# Patient Record
Sex: Female | Born: 1935 | Race: White | Hispanic: No | Smoking: Never smoker
Health system: Southern US, Community
[De-identification: ages and names within clinical notes are randomized; demographics above are authoritative.]

## PROBLEM LIST (undated history)

## (undated) DIAGNOSIS — K2990 Gastroduodenitis, unspecified, without bleeding: Secondary | ICD-10-CM

## (undated) DIAGNOSIS — K2970 Gastritis, unspecified, without bleeding: Secondary | ICD-10-CM

## (undated) DIAGNOSIS — E039 Hypothyroidism, unspecified: Secondary | ICD-10-CM

## (undated) DIAGNOSIS — R768 Other specified abnormal immunological findings in serum: Secondary | ICD-10-CM

## (undated) DIAGNOSIS — M179 Osteoarthritis of knee, unspecified: Secondary | ICD-10-CM

## (undated) DIAGNOSIS — I2699 Other pulmonary embolism without acute cor pulmonale: Secondary | ICD-10-CM

## (undated) DIAGNOSIS — I10 Essential (primary) hypertension: Secondary | ICD-10-CM

## (undated) DIAGNOSIS — M169 Osteoarthritis of hip, unspecified: Secondary | ICD-10-CM

## (undated) DIAGNOSIS — M2550 Pain in unspecified joint: Secondary | ICD-10-CM

## (undated) DIAGNOSIS — E7800 Pure hypercholesterolemia, unspecified: Secondary | ICD-10-CM

## (undated) DIAGNOSIS — C50919 Malignant neoplasm of unspecified site of unspecified female breast: Secondary | ICD-10-CM

## (undated) DIAGNOSIS — N281 Cyst of kidney, acquired: Secondary | ICD-10-CM

## (undated) DIAGNOSIS — E871 Hypo-osmolality and hyponatremia: Secondary | ICD-10-CM

## (undated) DIAGNOSIS — B009 Herpesviral infection, unspecified: Secondary | ICD-10-CM

## (undated) DIAGNOSIS — R609 Edema, unspecified: Secondary | ICD-10-CM

## (undated) DIAGNOSIS — D649 Anemia, unspecified: Secondary | ICD-10-CM

## (undated) DIAGNOSIS — M1710 Unilateral primary osteoarthritis, unspecified knee: Secondary | ICD-10-CM

## (undated) DIAGNOSIS — E538 Deficiency of other specified B group vitamins: Secondary | ICD-10-CM

## (undated) DIAGNOSIS — J309 Allergic rhinitis, unspecified: Secondary | ICD-10-CM

## (undated) DIAGNOSIS — E559 Vitamin D deficiency, unspecified: Secondary | ICD-10-CM

## (undated) DIAGNOSIS — G4700 Insomnia, unspecified: Secondary | ICD-10-CM

## (undated) DIAGNOSIS — G5790 Unspecified mononeuropathy of unspecified lower limb: Secondary | ICD-10-CM

## (undated) DIAGNOSIS — G629 Polyneuropathy, unspecified: Secondary | ICD-10-CM

## (undated) DIAGNOSIS — M19039 Primary osteoarthritis, unspecified wrist: Secondary | ICD-10-CM

## (undated) DIAGNOSIS — IMO0002 Reserved for concepts with insufficient information to code with codable children: Secondary | ICD-10-CM

## (undated) DIAGNOSIS — M810 Age-related osteoporosis without current pathological fracture: Secondary | ICD-10-CM

## (undated) DIAGNOSIS — Z8619 Personal history of other infectious and parasitic diseases: Secondary | ICD-10-CM

## (undated) DIAGNOSIS — R894 Abnormal immunological findings in specimens from other organs, systems and tissues: Secondary | ICD-10-CM

## (undated) DIAGNOSIS — N182 Chronic kidney disease, stage 2 (mild): Secondary | ICD-10-CM

## (undated) DIAGNOSIS — E1122 Type 2 diabetes mellitus with diabetic chronic kidney disease: Secondary | ICD-10-CM

## (undated) DIAGNOSIS — I671 Cerebral aneurysm, nonruptured: Secondary | ICD-10-CM

## (undated) HISTORY — DX: Anemia, unspecified: D64.9

## (undated) HISTORY — DX: Vitamin D deficiency, unspecified: E55.9

## (undated) HISTORY — PX: DILATION AND CURETTAGE OF UTERUS: SHX78

## (undated) HISTORY — PX: BREAST SURGERY: SHX581

## (undated) HISTORY — DX: Cyst of kidney, acquired: N28.1

## (undated) HISTORY — PX: NASAL SINUS SURGERY: SHX719

## (undated) HISTORY — DX: Insomnia, unspecified: G47.00

## (undated) HISTORY — PX: FRACTURE SURGERY: SHX138

## (undated) HISTORY — PX: KYPHOPLASTY: SHX5884

## (undated) HISTORY — DX: Herpesviral infection, unspecified: B00.9

## (undated) HISTORY — DX: Age-related osteoporosis without current pathological fracture: M81.0

## (undated) HISTORY — DX: Unspecified mononeuropathy of unspecified lower limb: G57.90

## (undated) HISTORY — DX: Osteoarthritis of hip, unspecified: M16.9

## (undated) HISTORY — DX: Pure hypercholesterolemia, unspecified: E78.00

## (undated) HISTORY — DX: Type 2 diabetes mellitus with diabetic chronic kidney disease: E11.22

## (undated) HISTORY — DX: Edema, unspecified: R60.9

## (undated) HISTORY — DX: Allergic rhinitis, unspecified: J30.9

## (undated) HISTORY — DX: Unilateral primary osteoarthritis, unspecified knee: M17.10

## (undated) HISTORY — DX: Reserved for concepts with insufficient information to code with codable children: IMO0002

## (undated) HISTORY — DX: Primary osteoarthritis, unspecified wrist: M19.039

## (undated) HISTORY — DX: Deficiency of other specified B group vitamins: E53.8

## (undated) HISTORY — DX: Essential (primary) hypertension: I10

## (undated) HISTORY — DX: Osteoarthritis of knee, unspecified: M17.9

## (undated) HISTORY — DX: Gastroduodenitis, unspecified, without bleeding: K29.90

## (undated) HISTORY — DX: Cerebral aneurysm, nonruptured: I67.1

## (undated) HISTORY — DX: Personal history of other infectious and parasitic diseases: Z86.19

## (undated) HISTORY — DX: Hypo-osmolality and hyponatremia: E87.1

## (undated) HISTORY — DX: Malignant neoplasm of unspecified site of unspecified female breast: C50.919

## (undated) HISTORY — DX: Chronic kidney disease, stage 2 (mild): N18.2

## (undated) HISTORY — DX: Hypothyroidism, unspecified: E03.9

## (undated) HISTORY — DX: Pain in unspecified joint: M25.50

## (undated) HISTORY — DX: Gastritis, unspecified, without bleeding: K29.70

## (undated) HISTORY — DX: Abnormal immunological findings in specimens from other organs, systems and tissues: R89.4

---
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
## 1966-05-16 HISTORY — PX: TONSILLECTOMY: SUR1361

## 1988-05-16 DIAGNOSIS — C50919 Malignant neoplasm of unspecified site of unspecified female breast: Secondary | ICD-10-CM

## 1988-05-16 HISTORY — DX: Malignant neoplasm of unspecified site of unspecified female breast: C50.919

## 1989-04-29 HISTORY — PX: MASTECTOMY: SHX3

## 2006-09-20 HISTORY — PX: LAPAROSCOPIC CHOLECYSTECTOMY: SUR755

## 2007-06-13 HISTORY — PX: ORIF HIP FRACTURE: SHX2125

## 2008-08-14 DIAGNOSIS — I671 Cerebral aneurysm, nonruptured: Secondary | ICD-10-CM

## 2008-08-14 HISTORY — DX: Cerebral aneurysm, nonruptured: I67.1

## 2010-07-03 HISTORY — PX: KYPHOPLASTY: SHX5884

## 2011-07-05 DIAGNOSIS — I10 Essential (primary) hypertension: Secondary | ICD-10-CM | POA: Diagnosis not present

## 2011-07-05 DIAGNOSIS — M1710 Unilateral primary osteoarthritis, unspecified knee: Secondary | ICD-10-CM | POA: Diagnosis not present

## 2011-07-05 DIAGNOSIS — E039 Hypothyroidism, unspecified: Secondary | ICD-10-CM | POA: Diagnosis not present

## 2011-07-05 DIAGNOSIS — E7800 Pure hypercholesterolemia, unspecified: Secondary | ICD-10-CM | POA: Diagnosis not present

## 2011-07-05 DIAGNOSIS — E538 Deficiency of other specified B group vitamins: Secondary | ICD-10-CM | POA: Diagnosis not present

## 2011-07-19 DIAGNOSIS — I110 Hypertensive heart disease with heart failure: Secondary | ICD-10-CM | POA: Diagnosis not present

## 2011-07-19 DIAGNOSIS — I509 Heart failure, unspecified: Secondary | ICD-10-CM | POA: Diagnosis not present

## 2011-08-02 DIAGNOSIS — M810 Age-related osteoporosis without current pathological fracture: Secondary | ICD-10-CM | POA: Diagnosis not present

## 2011-08-02 DIAGNOSIS — Z780 Asymptomatic menopausal state: Secondary | ICD-10-CM | POA: Diagnosis not present

## 2011-08-26 DIAGNOSIS — C50919 Malignant neoplasm of unspecified site of unspecified female breast: Secondary | ICD-10-CM | POA: Diagnosis not present

## 2011-08-26 DIAGNOSIS — M810 Age-related osteoporosis without current pathological fracture: Secondary | ICD-10-CM | POA: Diagnosis not present

## 2011-08-26 DIAGNOSIS — M159 Polyosteoarthritis, unspecified: Secondary | ICD-10-CM | POA: Diagnosis not present

## 2011-08-26 DIAGNOSIS — I10 Essential (primary) hypertension: Secondary | ICD-10-CM | POA: Diagnosis not present

## 2011-10-18 DIAGNOSIS — R6889 Other general symptoms and signs: Secondary | ICD-10-CM | POA: Diagnosis not present

## 2011-10-18 DIAGNOSIS — R7989 Other specified abnormal findings of blood chemistry: Secondary | ICD-10-CM | POA: Diagnosis not present

## 2011-10-18 DIAGNOSIS — C119 Malignant neoplasm of nasopharynx, unspecified: Secondary | ICD-10-CM | POA: Diagnosis not present

## 2011-10-18 DIAGNOSIS — C50919 Malignant neoplasm of unspecified site of unspecified female breast: Secondary | ICD-10-CM | POA: Diagnosis not present

## 2011-10-18 DIAGNOSIS — M810 Age-related osteoporosis without current pathological fracture: Secondary | ICD-10-CM | POA: Diagnosis not present

## 2011-11-04 DIAGNOSIS — Z853 Personal history of malignant neoplasm of breast: Secondary | ICD-10-CM | POA: Diagnosis not present

## 2011-11-04 DIAGNOSIS — Z9010 Acquired absence of unspecified breast and nipple: Secondary | ICD-10-CM | POA: Diagnosis not present

## 2011-11-04 DIAGNOSIS — Z1231 Encounter for screening mammogram for malignant neoplasm of breast: Secondary | ICD-10-CM | POA: Diagnosis not present

## 2012-01-10 DIAGNOSIS — E039 Hypothyroidism, unspecified: Secondary | ICD-10-CM | POA: Diagnosis not present

## 2012-01-10 DIAGNOSIS — I10 Essential (primary) hypertension: Secondary | ICD-10-CM | POA: Diagnosis not present

## 2012-01-10 DIAGNOSIS — E7800 Pure hypercholesterolemia, unspecified: Secondary | ICD-10-CM | POA: Diagnosis not present

## 2012-01-10 DIAGNOSIS — E538 Deficiency of other specified B group vitamins: Secondary | ICD-10-CM | POA: Diagnosis not present

## 2012-01-18 DIAGNOSIS — I10 Essential (primary) hypertension: Secondary | ICD-10-CM | POA: Diagnosis not present

## 2012-01-18 DIAGNOSIS — E538 Deficiency of other specified B group vitamins: Secondary | ICD-10-CM | POA: Diagnosis not present

## 2012-01-18 DIAGNOSIS — E7800 Pure hypercholesterolemia, unspecified: Secondary | ICD-10-CM | POA: Diagnosis not present

## 2012-01-18 DIAGNOSIS — E039 Hypothyroidism, unspecified: Secondary | ICD-10-CM | POA: Diagnosis not present

## 2012-01-18 DIAGNOSIS — E559 Vitamin D deficiency, unspecified: Secondary | ICD-10-CM | POA: Diagnosis not present

## 2012-02-09 DIAGNOSIS — I369 Nonrheumatic tricuspid valve disorder, unspecified: Secondary | ICD-10-CM | POA: Diagnosis not present

## 2012-02-09 DIAGNOSIS — I110 Hypertensive heart disease with heart failure: Secondary | ICD-10-CM | POA: Diagnosis not present

## 2012-02-09 DIAGNOSIS — I509 Heart failure, unspecified: Secondary | ICD-10-CM | POA: Diagnosis not present

## 2012-02-09 DIAGNOSIS — I2609 Other pulmonary embolism with acute cor pulmonale: Secondary | ICD-10-CM | POA: Diagnosis not present

## 2012-02-09 DIAGNOSIS — I059 Rheumatic mitral valve disease, unspecified: Secondary | ICD-10-CM | POA: Diagnosis not present

## 2012-02-17 DIAGNOSIS — E039 Hypothyroidism, unspecified: Secondary | ICD-10-CM | POA: Diagnosis not present

## 2012-02-17 DIAGNOSIS — E538 Deficiency of other specified B group vitamins: Secondary | ICD-10-CM | POA: Diagnosis not present

## 2012-02-28 DIAGNOSIS — C50919 Malignant neoplasm of unspecified site of unspecified female breast: Secondary | ICD-10-CM | POA: Diagnosis not present

## 2012-02-28 DIAGNOSIS — I10 Essential (primary) hypertension: Secondary | ICD-10-CM | POA: Diagnosis not present

## 2012-02-28 DIAGNOSIS — M810 Age-related osteoporosis without current pathological fracture: Secondary | ICD-10-CM | POA: Diagnosis not present

## 2012-02-28 DIAGNOSIS — M159 Polyosteoarthritis, unspecified: Secondary | ICD-10-CM | POA: Diagnosis not present

## 2012-03-05 DIAGNOSIS — Z23 Encounter for immunization: Secondary | ICD-10-CM | POA: Diagnosis not present

## 2012-04-05 DIAGNOSIS — I10 Essential (primary) hypertension: Secondary | ICD-10-CM | POA: Diagnosis not present

## 2012-04-05 DIAGNOSIS — M549 Dorsalgia, unspecified: Secondary | ICD-10-CM | POA: Diagnosis not present

## 2012-04-05 DIAGNOSIS — G8929 Other chronic pain: Secondary | ICD-10-CM | POA: Diagnosis not present

## 2012-04-05 DIAGNOSIS — R110 Nausea: Secondary | ICD-10-CM | POA: Diagnosis not present

## 2012-07-24 DIAGNOSIS — C50919 Malignant neoplasm of unspecified site of unspecified female breast: Secondary | ICD-10-CM | POA: Diagnosis not present

## 2012-07-24 DIAGNOSIS — M159 Polyosteoarthritis, unspecified: Secondary | ICD-10-CM | POA: Diagnosis not present

## 2012-07-24 DIAGNOSIS — I10 Essential (primary) hypertension: Secondary | ICD-10-CM | POA: Diagnosis not present

## 2012-07-24 DIAGNOSIS — M810 Age-related osteoporosis without current pathological fracture: Secondary | ICD-10-CM | POA: Diagnosis not present

## 2012-08-09 DIAGNOSIS — I110 Hypertensive heart disease with heart failure: Secondary | ICD-10-CM | POA: Diagnosis not present

## 2012-08-09 DIAGNOSIS — I2609 Other pulmonary embolism with acute cor pulmonale: Secondary | ICD-10-CM | POA: Diagnosis not present

## 2012-08-09 DIAGNOSIS — I509 Heart failure, unspecified: Secondary | ICD-10-CM | POA: Diagnosis not present

## 2012-09-25 DIAGNOSIS — R0602 Shortness of breath: Secondary | ICD-10-CM | POA: Diagnosis not present

## 2012-09-25 DIAGNOSIS — Z853 Personal history of malignant neoplasm of breast: Secondary | ICD-10-CM | POA: Diagnosis not present

## 2012-09-25 DIAGNOSIS — R5381 Other malaise: Secondary | ICD-10-CM | POA: Diagnosis not present

## 2012-09-25 DIAGNOSIS — Z79899 Other long term (current) drug therapy: Secondary | ICD-10-CM | POA: Diagnosis not present

## 2012-09-25 DIAGNOSIS — R05 Cough: Secondary | ICD-10-CM | POA: Diagnosis not present

## 2012-09-25 DIAGNOSIS — E860 Dehydration: Secondary | ICD-10-CM | POA: Diagnosis not present

## 2012-09-25 DIAGNOSIS — J189 Pneumonia, unspecified organism: Secondary | ICD-10-CM | POA: Diagnosis not present

## 2012-09-25 DIAGNOSIS — I498 Other specified cardiac arrhythmias: Secondary | ICD-10-CM | POA: Diagnosis not present

## 2012-09-25 DIAGNOSIS — I517 Cardiomegaly: Secondary | ICD-10-CM | POA: Diagnosis not present

## 2012-09-25 DIAGNOSIS — E039 Hypothyroidism, unspecified: Secondary | ICD-10-CM | POA: Diagnosis not present

## 2012-09-25 DIAGNOSIS — I10 Essential (primary) hypertension: Secondary | ICD-10-CM | POA: Diagnosis not present

## 2012-09-25 DIAGNOSIS — R791 Abnormal coagulation profile: Secondary | ICD-10-CM | POA: Diagnosis not present

## 2012-09-25 DIAGNOSIS — R000 Tachycardia, unspecified: Secondary | ICD-10-CM | POA: Diagnosis not present

## 2012-10-12 DIAGNOSIS — E039 Hypothyroidism, unspecified: Secondary | ICD-10-CM | POA: Diagnosis not present

## 2012-10-12 DIAGNOSIS — E538 Deficiency of other specified B group vitamins: Secondary | ICD-10-CM | POA: Diagnosis not present

## 2012-10-16 DIAGNOSIS — R142 Eructation: Secondary | ICD-10-CM | POA: Diagnosis not present

## 2012-10-16 DIAGNOSIS — R0989 Other specified symptoms and signs involving the circulatory and respiratory systems: Secondary | ICD-10-CM | POA: Diagnosis not present

## 2012-10-16 DIAGNOSIS — R630 Anorexia: Secondary | ICD-10-CM | POA: Diagnosis not present

## 2012-10-16 DIAGNOSIS — J309 Allergic rhinitis, unspecified: Secondary | ICD-10-CM | POA: Diagnosis not present

## 2012-10-16 DIAGNOSIS — R143 Flatulence: Secondary | ICD-10-CM | POA: Diagnosis not present

## 2012-10-16 DIAGNOSIS — R141 Gas pain: Secondary | ICD-10-CM | POA: Diagnosis not present

## 2012-10-26 DIAGNOSIS — R143 Flatulence: Secondary | ICD-10-CM | POA: Diagnosis not present

## 2012-11-27 DIAGNOSIS — C50919 Malignant neoplasm of unspecified site of unspecified female breast: Secondary | ICD-10-CM | POA: Diagnosis not present

## 2012-11-27 DIAGNOSIS — M810 Age-related osteoporosis without current pathological fracture: Secondary | ICD-10-CM | POA: Diagnosis not present

## 2012-11-27 DIAGNOSIS — M159 Polyosteoarthritis, unspecified: Secondary | ICD-10-CM | POA: Diagnosis not present

## 2012-11-27 DIAGNOSIS — E039 Hypothyroidism, unspecified: Secondary | ICD-10-CM | POA: Diagnosis not present

## 2013-01-01 DIAGNOSIS — E039 Hypothyroidism, unspecified: Secondary | ICD-10-CM | POA: Diagnosis not present

## 2013-01-01 DIAGNOSIS — E559 Vitamin D deficiency, unspecified: Secondary | ICD-10-CM | POA: Diagnosis not present

## 2013-01-01 DIAGNOSIS — M159 Polyosteoarthritis, unspecified: Secondary | ICD-10-CM | POA: Diagnosis not present

## 2013-01-01 DIAGNOSIS — M810 Age-related osteoporosis without current pathological fracture: Secondary | ICD-10-CM | POA: Diagnosis not present

## 2013-01-01 DIAGNOSIS — E538 Deficiency of other specified B group vitamins: Secondary | ICD-10-CM | POA: Diagnosis not present

## 2013-01-01 DIAGNOSIS — E871 Hypo-osmolality and hyponatremia: Secondary | ICD-10-CM | POA: Diagnosis not present

## 2013-01-01 DIAGNOSIS — I10 Essential (primary) hypertension: Secondary | ICD-10-CM | POA: Diagnosis not present

## 2013-02-21 DIAGNOSIS — I10 Essential (primary) hypertension: Secondary | ICD-10-CM | POA: Diagnosis not present

## 2013-02-21 DIAGNOSIS — E039 Hypothyroidism, unspecified: Secondary | ICD-10-CM | POA: Diagnosis not present

## 2013-02-21 DIAGNOSIS — M25539 Pain in unspecified wrist: Secondary | ICD-10-CM | POA: Diagnosis not present

## 2013-02-21 DIAGNOSIS — M2550 Pain in unspecified joint: Secondary | ICD-10-CM | POA: Diagnosis not present

## 2013-02-21 DIAGNOSIS — G4700 Insomnia, unspecified: Secondary | ICD-10-CM | POA: Diagnosis not present

## 2013-02-21 DIAGNOSIS — Z23 Encounter for immunization: Secondary | ICD-10-CM | POA: Diagnosis not present

## 2013-02-26 DIAGNOSIS — I369 Nonrheumatic tricuspid valve disorder, unspecified: Secondary | ICD-10-CM | POA: Diagnosis not present

## 2013-02-26 DIAGNOSIS — I110 Hypertensive heart disease with heart failure: Secondary | ICD-10-CM | POA: Diagnosis not present

## 2013-02-26 DIAGNOSIS — I059 Rheumatic mitral valve disease, unspecified: Secondary | ICD-10-CM | POA: Diagnosis not present

## 2013-02-26 DIAGNOSIS — R9431 Abnormal electrocardiogram [ECG] [EKG]: Secondary | ICD-10-CM | POA: Diagnosis not present

## 2013-04-01 DIAGNOSIS — E039 Hypothyroidism, unspecified: Secondary | ICD-10-CM | POA: Diagnosis not present

## 2013-05-16 DIAGNOSIS — R609 Edema, unspecified: Secondary | ICD-10-CM

## 2013-05-16 HISTORY — DX: Edema, unspecified: R60.9

## 2013-10-17 ENCOUNTER — Ambulatory Visit: Payer: Medicare Other | Admitting: Nurse Practitioner

## 2013-10-22 ENCOUNTER — Encounter: Payer: Self-pay | Admitting: Internal Medicine

## 2013-10-22 ENCOUNTER — Non-Acute Institutional Stay: Payer: Medicare Other | Admitting: Internal Medicine

## 2013-10-22 VITALS — BP 140/68 | HR 68 | Temp 97.4°F | Ht 63.0 in | Wt 121.0 lb

## 2013-10-22 DIAGNOSIS — C50919 Malignant neoplasm of unspecified site of unspecified female breast: Secondary | ICD-10-CM

## 2013-10-22 DIAGNOSIS — M810 Age-related osteoporosis without current pathological fracture: Secondary | ICD-10-CM | POA: Diagnosis not present

## 2013-10-22 DIAGNOSIS — G4700 Insomnia, unspecified: Secondary | ICD-10-CM

## 2013-10-22 DIAGNOSIS — M169 Osteoarthritis of hip, unspecified: Secondary | ICD-10-CM

## 2013-10-22 DIAGNOSIS — I10 Essential (primary) hypertension: Secondary | ICD-10-CM

## 2013-10-22 DIAGNOSIS — E7800 Pure hypercholesterolemia, unspecified: Secondary | ICD-10-CM

## 2013-10-22 DIAGNOSIS — M1610 Unilateral primary osteoarthritis, unspecified hip: Secondary | ICD-10-CM

## 2013-10-22 DIAGNOSIS — M19039 Primary osteoarthritis, unspecified wrist: Secondary | ICD-10-CM

## 2013-10-22 DIAGNOSIS — R609 Edema, unspecified: Secondary | ICD-10-CM

## 2013-10-22 DIAGNOSIS — E039 Hypothyroidism, unspecified: Secondary | ICD-10-CM

## 2013-10-22 DIAGNOSIS — D649 Anemia, unspecified: Secondary | ICD-10-CM | POA: Diagnosis not present

## 2013-10-22 DIAGNOSIS — IMO0002 Reserved for concepts with insufficient information to code with codable children: Secondary | ICD-10-CM

## 2013-10-22 NOTE — Progress Notes (Signed)
Patient ID: Bailey [name], female   DOB: [birth year], 80 y.o.   MRN: [id number]    Location:  [facility] [phone])  Extended Emergency Contact Information Primary Emergency Contact: Tricarico,[name] Address: [address]          [place],  [address] [name] of [place] Phone: [phone] Relation: Spouse   Chief Complaint  Patient presents with  . Medical Management of Chronic Issues    New Patient, c/o back pain, needs refills on medications    HPI:  New patient. First visit. Previously lived in [place]. Son and daughter in law live in [place]. Moved from [place] 55 years ago. (1960).  She previously worked as a Marine scientist.  Hx OP and fracture of vertebra. Hx of kyphoplasty in 2009 and 2010. Losing height. Went from 58" to 55".  Left femur fracture in 2009.  Back pain has been worse lately. Complains of left hip and knee pains.  Edema of both legs since  Feb 2015.   Past Medical History  Diagnosis Date  . Osteoarthrosis, unspecified whether generalized or localized, forearm     bilaterally wrist  . Allergic rhinitis, cause unspecified   . Senile osteoporosis   . Malignant neoplasm of breast (female), unspecified site 1990    left. Had surgerey and chemo, but no radiation  . Acquired cyst of kidney     left  . 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 Anemia, unspecified   . Unspecified gastritis and gastroduodenitis without mention of hemorrhage   . 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 Other and unspecified nonspecific immunological findings     positive ANA  . History of Epstein-Barr virus infection   . Herpes simplex without mention of complication   . Insomnia, unspecified   . Unspecified hypothyroidism   . Hyposmolality and/or hyponatremia   . Unspecified vitamin D deficiency   . Pure hypercholesterolemia   . Other  B-complex deficiencies   . Osteoarthrosis of knee     bilaterally  . Closed fracture of unspecified part of vertebral column without mention of spinal cord injury     T7 s/p kyphoplasty, T12  . Unspecified essential hypertension   . Pain in joint, site unspecified     severe, diffuse, chronic pain  . Osteoarthrosis, hip     Past Surgical History  Procedure Laterality Date  . Dilation and curettage of uterus      x2  . Middle ear surgery Bilateral 1938  . Mastectomy Left 04/29/1989  . Tonsillectomy  1968  . Nasal sinus surgery  1990 & 2000  . Cholecystectomy, laparoscopic  09/20/2006  . Orif hip fracture Left 06/13/2007    following a syncopal episode  . Kyphoplasty  07/03/2010    T7 and T12    History   Social History  . Marital Status: Married    Spouse Name: N/A    Number of Children: N/A  . Years of Education: N/A   Occupational History  . retired Marine scientist    Social History Main Topics  . Smoking status: Former Research scientist (life sciences)  . Smokeless tobacco: Never Used  . Alcohol Use: No  . Drug Use: No  . Sexual Activity: Not on file   Other Topics Concern  . Not on file   Social History Narrative   Lives at [facility] since 10/16/2013, moved  from [place]    Married 1968   Never smoked   No alcohol    Exercise walking, walks with cane                 reports that she has quit smoking. She has never used smokeless tobacco. She reports that she does not drink alcohol or use illicit drugs.   There is no immunization history on file for this patient.  Allergies  Allergen Reactions  . Aspirin     Drop in body temp with large quantities, can tolerate low doses of aspirin  . Penicillins Swelling    Medications: Patient's Medications  New Prescriptions   No medications on file  Previous Medications   ACETAMINOPHEN-CODEINE (TYLENOL/CODEINE #3) 300-30 MG PER TABLET    Take 1 tablet by mouth. Take one tablet every 4 hours daily as needed for pain   AMLODIPINE (NORVASC) 5 MG  TABLET    Take 5 mg by mouth. Take one tablet twice daily for blood pressure   ASPIRIN 81 MG TABLET    Take 81 mg by mouth daily.   ATENOLOL (TENORMIN) 25 MG TABLET    Take by mouth. Take one tablet daily for blood pressure   CYANOCOBALAMIN (VITAMIN B-12 CR PO)    Take by mouth. Take one daily   FENOFIBRATE (TRICOR) 48 MG TABLET    Take 48 mg by mouth daily. Take one tablet daily for triglycerides   GABAPENTIN (NEURONTIN) 100 MG CAPSULE    Take 100 mg by mouth. Take one at night for nerve pain   IRBESARTAN (AVAPRO) 300 MG TABLET    Take 300 mg by mouth. Take one tablet daily for blood pressure   LEVOTHYROXINE (SYNTHROID, LEVOTHROID) 112 MCG TABLET    Take 112 mcg by mouth daily before breakfast. For thyroid   LIDOCAINE (LIDODERM) 5 %    Place 1 patch onto the skin. Use 2-3 patches a day for pain on in morning and off in evening   NAPROXEN SODIUM (ANAPROX) 220 MG TABLET    Take 220 mg by mouth. Take one twice daily, two at night   OXYCODONE-ACETAMINOPHEN (PERCOCET/ROXICET) 5-325 MG PER TABLET    Take by mouth every 4 (four) hours as needed for severe pain.   PANTOPRAZOLE (PROTONIX) 40 MG TABLET    Take 40 mg by mouth. Take one daily for acid reflux   ZOLPIDEM (AMBIEN) 10 MG TABLET    Take 10 mg by mouth at bedtime as needed for sleep.  Modified Medications   No medications on file  Discontinued Medications   No medications on file   PROCEDURES 04/24/2003 bone density: Lumbar spine T score -4. Left hip femoral neck T score -3.3 04/11/2005 bone density: Lumbar spine T score -3.7. Left hip femoral neck T score -2.2. Right hip femoral neck T score -2.0 04/12/2005 bone density: Lumbar spine T score -3.7.  04/19/2007 bone density:.Lumber spine T score -4.0. Femoral neck -2.6 06/10/2008 bone density: Lumbar spine T score -4.1. Right femoral neck T score -2.8. 07/06/2010 bone density: Lumbar spine T score of -4.5. Right femoral neck T score -2.7 08/03/2010 bone scan, nuclear medicine  Review of  Systems  Constitutional: Positive for activity change and fatigue. Negative for fever and unexpected weight change.  HENT:       Sinus allergies  Eyes: Positive for visual disturbance (Corrective lenses).  Respiratory: Positive for shortness of breath (On exertion).   Cardiovascular: Positive for leg swelling. Negative for chest pain and  palpitations.  Gastrointestinal: Positive for constipation.       Flatulence. Hemorrhoids.   Endocrine: Negative.   Genitourinary: Negative.   Musculoskeletal: Positive for back pain and gait problem.       History of osteoporosis.  Skin: Negative.   Allergic/Immunologic: Negative.   Neurological: Negative for dizziness, tremors, speech difficulty and light-headedness.  Hematological: Negative.   Psychiatric/Behavioral: Negative.     Filed Vitals:   10/22/13 0951  BP: 140/68  Pulse: 68  Temp: 97.4 F (36.3 C)  TempSrc: Oral  Height: 5\' 3"  (1.6 m)  Weight: 121 lb (54.885 kg)  SpO2: 99%   Body mass index is 21.44 kg/(m^2).  Physical Exam  Constitutional: She is oriented to person, place, and time. She appears distressed.  Elderly. Frail. Slow in movement due to back pain.  HENT:  Right Ear: External ear normal.  Left Ear: External ear normal.  Nose: Nose normal.  Mouth/Throat: Oropharynx is clear and moist. No oropharyngeal exudate.  Eyes: Conjunctivae and EOM are normal. Pupils are equal, round, and reactive to light.  Neck: No JVD present. No tracheal deviation present. No thyromegaly present.  Cardiovascular: Normal rate, regular rhythm, normal heart sounds and intact distal pulses.  Exam reveals no gallop and no friction rub.   No murmur heard. Pulmonary/Chest: No respiratory distress. She has no wheezes. She has no rales. She exhibits no tenderness.  Abdominal: She exhibits no distension and no mass. There is no tenderness.  Musculoskeletal: Normal range of motion. She exhibits edema and tenderness (Lumbar area).  Lymphadenopathy:      She has no cervical adenopathy.  Neurological: She is alert and oriented to person, place, and time. She has normal reflexes.  10/22/2013 MMSE 30/30. Passed clock drawing  Skin: No rash noted. No erythema. No pallor.  Psychiatric: She has a normal mood and affect. Her behavior is normal. Judgment and thought content normal.     Labs reviewed: 04/18/2013  CBC: Normal  CMP: Normal except for sodium 132  Lipids: Total 171, trig 177, HDL 65 LDL 71  TSH 2.36  Urinalysis normal  B12 230  Vitamin D 45 this   03/04/2011 serum iron 102  B12 157 (normal [phone])  Folate> 24  Ferritin 234   Assessment/Plan   1. Osteoarthrosis, unspecified whether generalized or localized, forearm Generalized osteoarthritis involving his, back, hips, and knees  2. Senile osteoporosis Significant problems with osteoporosis documented both times with bone density testing. She has received Fosamax in the past for10 years. She is asking if there are further treatments for this disorder. I recommended Prolia. We will do the financial screening so that she is aware of costs and copay.  3. Malignant neoplasm of breast (female), unspecified site 66. Appears to be in remission.  4. Anemia, unspecified Patient is B12 deficient documented previously.  5. Insomnia, unspecified Generally doing. Son has been worse recently because of back pain. Ran out of her pain medications prior to seeing me, because of her move from [place] to [place].  6. Unspecified hypothyroidism Compensated  7. Pure hypercholesterolemia Control  8. Closed fracture of unspecified part of vertebral column without mention of spinal cord injury Patient may need a repeat x-ray of the lumbar spine. We're going to start her back on her usual pain medication. She reports there has been a failure to improve.  9. Unspecified essential hypertension Blood pressure is running lower than generally the past  10. Osteoarthrosis,  hip Previous fracture of the left hip. Osteoarthritis of  left hip  11. Edema Etiology is not clear. Edema is 1+ bilaterally.

## 2013-10-24 ENCOUNTER — Encounter: Payer: Self-pay | Admitting: Internal Medicine

## 2013-10-24 DIAGNOSIS — IMO0002 Reserved for concepts with insufficient information to code with codable children: Secondary | ICD-10-CM | POA: Insufficient documentation

## 2013-10-24 DIAGNOSIS — C50919 Malignant neoplasm of unspecified site of unspecified female breast: Secondary | ICD-10-CM | POA: Insufficient documentation

## 2013-10-24 DIAGNOSIS — I129 Hypertensive chronic kidney disease with stage 1 through stage 4 chronic kidney disease, or unspecified chronic kidney disease: Secondary | ICD-10-CM | POA: Insufficient documentation

## 2013-10-24 DIAGNOSIS — E039 Hypothyroidism, unspecified: Secondary | ICD-10-CM | POA: Diagnosis not present

## 2013-10-24 DIAGNOSIS — M169 Osteoarthritis of hip, unspecified: Secondary | ICD-10-CM | POA: Insufficient documentation

## 2013-10-24 DIAGNOSIS — E7800 Pure hypercholesterolemia, unspecified: Secondary | ICD-10-CM | POA: Diagnosis not present

## 2013-10-24 DIAGNOSIS — N183 Chronic kidney disease, stage 3 (moderate): Secondary | ICD-10-CM

## 2013-10-24 DIAGNOSIS — D649 Anemia, unspecified: Secondary | ICD-10-CM | POA: Insufficient documentation

## 2013-10-24 DIAGNOSIS — M810 Age-related osteoporosis without current pathological fracture: Secondary | ICD-10-CM | POA: Insufficient documentation

## 2013-10-24 DIAGNOSIS — I10 Essential (primary) hypertension: Secondary | ICD-10-CM | POA: Diagnosis not present

## 2013-10-24 DIAGNOSIS — G4700 Insomnia, unspecified: Secondary | ICD-10-CM | POA: Insufficient documentation

## 2013-10-24 DIAGNOSIS — R609 Edema, unspecified: Secondary | ICD-10-CM | POA: Insufficient documentation

## 2013-10-24 DIAGNOSIS — M19039 Primary osteoarthritis, unspecified wrist: Secondary | ICD-10-CM | POA: Insufficient documentation

## 2013-10-24 LAB — LIPID PANEL
Cholesterol: 129 mg/dL (ref 0–200)
HDL: 63 mg/dL (ref 35–70)
LDL Cholesterol: 44 mg/dL
TRIGLYCERIDES: 110 mg/dL (ref 40–160)

## 2013-10-24 LAB — HEPATIC FUNCTION PANEL
ALK PHOS: 95 U/L (ref 25–125)
ALT: 10 U/L (ref 7–35)
AST: 16 U/L (ref 13–35)
Bilirubin, Total: 0.8 mg/dL

## 2013-10-24 LAB — BASIC METABOLIC PANEL
BUN: 27 mg/dL — AB (ref 4–21)
Creatinine: 1.2 mg/dL — AB (ref 0.5–1.1)
GLUCOSE: 91 mg/dL
Potassium: 4.2 mmol/L (ref 3.4–5.3)
SODIUM: 134 mmol/L — AB (ref 137–147)

## 2013-10-24 LAB — TSH: TSH: 10.6 u[IU]/mL — AB (ref 0.41–5.90)

## 2013-10-25 ENCOUNTER — Telehealth: Payer: Self-pay

## 2013-10-25 ENCOUNTER — Other Ambulatory Visit: Payer: Self-pay

## 2013-10-25 NOTE — Telephone Encounter (Signed)
Called Bailey [name] with 10/24/13 lab results, spoke with patient and husband. Per Dr. Nyoka Cowden lab ok except mild Renal insufficieny and elevated TSH. Repeat TSH in 6 weeks. That would be 11/05/13. Fax order to [facility] clinic nurse.

## 2013-10-29 ENCOUNTER — Telehealth: Payer: Self-pay

## 2013-10-29 NOTE — Telephone Encounter (Signed)
Received  Insurance eligibility results, covered 100%. Spoke with patient and her husband today in clinic, Schedule appt 11/07/13 for Prolia injection at [facility] office.

## 2013-10-31 ENCOUNTER — Ambulatory Visit: Payer: Self-pay

## 2013-11-04 ENCOUNTER — Encounter: Payer: Self-pay | Admitting: Internal Medicine

## 2013-11-04 DIAGNOSIS — E039 Hypothyroidism, unspecified: Secondary | ICD-10-CM | POA: Diagnosis not present

## 2013-11-04 LAB — TSH: TSH: 4.01 u[IU]/mL (ref 0.41–5.90)

## 2013-11-05 ENCOUNTER — Telehealth: Payer: Self-pay

## 2013-11-05 NOTE — Telephone Encounter (Signed)
11/04/13 TSH 4.012, normal continue current dose of Synthroid, be sure to take it every morning before she eats. Appt with Dr. Nyoka Cowden 11/26/13.

## 2013-11-07 ENCOUNTER — Ambulatory Visit: Payer: Medicare Other

## 2013-11-12 ENCOUNTER — Telehealth: Payer: Self-pay | Admitting: Internal Medicine

## 2013-11-12 ENCOUNTER — Ambulatory Visit (INDEPENDENT_AMBULATORY_CARE_PROVIDER_SITE_OTHER): Payer: Medicare Other

## 2013-11-12 DIAGNOSIS — M810 Age-related osteoporosis without current pathological fracture: Secondary | ICD-10-CM | POA: Diagnosis not present

## 2013-11-12 MED ORDER — DENOSUMAB 60 MG/ML ~~LOC~~ SOLN
60.0000 mg | Freq: Once | SUBCUTANEOUS | Status: AC
  Administered 2013-11-12: 60 mg via SUBCUTANEOUS

## 2013-11-12 NOTE — Telephone Encounter (Signed)
I received a call from Bailey [name], with [facility] on last night.  She explained to me Bailey [name] had come to the office to have a Prolia injection,  however, we did not have Prolia medication in the office to give to the patient.Bailey [name] I would discuss with Bailey [name] to find out what happened, and would get back with her, I also told Bailey [name] I would contact the patient,. Which I did.   I tallked with Bailey [name], apologized for the miscommunication.  Told them Bailey [name] would be calling them today to get a good time for Bailey [name] to come and have the injections today.  Bailey [name] was pleased.Marland KitchenMarland KitchenCaren [name]

## 2013-11-20 ENCOUNTER — Other Ambulatory Visit: Payer: Self-pay | Admitting: Internal Medicine

## 2013-11-26 ENCOUNTER — Non-Acute Institutional Stay: Payer: Medicare Other | Admitting: Internal Medicine

## 2013-11-26 ENCOUNTER — Encounter: Payer: Self-pay | Admitting: Internal Medicine

## 2013-11-26 VITALS — BP 142/82 | HR 64 | Wt 121.0 lb

## 2013-11-26 DIAGNOSIS — M19039 Primary osteoarthritis, unspecified wrist: Secondary | ICD-10-CM

## 2013-11-26 DIAGNOSIS — I10 Essential (primary) hypertension: Secondary | ICD-10-CM | POA: Diagnosis not present

## 2013-11-26 DIAGNOSIS — E039 Hypothyroidism, unspecified: Secondary | ICD-10-CM

## 2013-11-26 DIAGNOSIS — M810 Age-related osteoporosis without current pathological fracture: Secondary | ICD-10-CM

## 2013-11-26 DIAGNOSIS — E7800 Pure hypercholesterolemia, unspecified: Secondary | ICD-10-CM

## 2013-11-26 DIAGNOSIS — R609 Edema, unspecified: Secondary | ICD-10-CM

## 2013-11-26 MED ORDER — OXYCODONE-ACETAMINOPHEN 5-325 MG PO TABS: ORAL_TABLET | ORAL | Status: DC

## 2013-11-26 MED ORDER — DICLOFENAC SODIUM 50 MG PO TBEC: DELAYED_RELEASE_TABLET | ORAL | Status: DC

## 2013-11-26 NOTE — Progress Notes (Signed)
Patient ID: Bailey [name], female   DOB: [birth year], 80 y.o.   MRN: [id number]    Location:  [facility]   Place of Service: Clinic (12)    Allergies  Allergen Reactions  . Aspirin     Drop in body temp with large quantities, can tolerate low doses of aspirin  . Penicillins Swelling    Chief Complaint  Patient presents with  . Medical Management of Chronic Issues    blood pressure, thyroid, osteoarthritis, edema    HPI:  Unspecified hypothyroidism: compensated. Prior elevation in TSH has returned to normal.  Unspecified essential hypertension: controlled  Senile osteoporosis: now on Prolia. Tolerated injection without side effect  Edema: trace to 1+ bilateral.  Osteoarthrosis, unspecified whether generalized or localized, forearm; pains in hips and left knee. Says she twisted the left knee during her move to [place]. She thinks it is getting better.    Medications: Patient's Medications  New Prescriptions   No medications on file  Previous Medications   AMLODIPINE (NORVASC) 5 MG TABLET    Take 5 mg by mouth. Take one tablet twice daily for blood pressure   ASPIRIN 81 MG TABLET    Take 81 mg by mouth daily.   ATENOLOL (TENORMIN) 25 MG TABLET    Take by mouth. Take one tablet daily for blood pressure   CYANOCOBALAMIN (VITAMIN B-12 CR PO)    Take by mouth. Take one daily   IRBESARTAN (AVAPRO) 300 MG TABLET    Take 300 mg by mouth. Take one tablet daily for blood pressure   LEVOTHYROXINE (SYNTHROID, LEVOTHROID) 112 MCG TABLET    Take 112 mcg by mouth daily before breakfast. For thyroid   LIDOCAINE (LIDODERM) 5 %    Place 1 patch onto the skin. Use 2-3 patches a day for pain on in morning and off in evening   OXYCODONE-ACETAMINOPHEN (PERCOCET/ROXICET) 5-325 MG PER TABLET    Take by mouth every 4 (four) hours as needed for severe pain.   ZOLPIDEM (AMBIEN) 10 MG TABLET    TAKE 1 TABLET BY MOUTH EVERY NIGHT AT BEDTIME FOR SLEEP  Modified Medications   No medications  on file  Discontinued Medications   NAPROXEN SODIUM (ANAPROX) 220 MG TABLET    Take 220 mg by mouth. Take one twice daily, two at night   PANTOPRAZOLE (PROTONIX) 40 MG TABLET    Take 40 mg by mouth. Take one daily for acid reflux     Review of Systems  Constitutional: Positive for activity change and fatigue. Negative for fever and unexpected weight change.  HENT:       Sinus allergies  Eyes: Positive for visual disturbance (Corrective lenses).  Respiratory: Positive for shortness of breath (On exertion).   Cardiovascular: Positive for leg swelling. Negative for chest pain and palpitations.  Gastrointestinal: Positive for constipation.       Flatulence. Hemorrhoids.  Endocrine: Negative.   Genitourinary: Negative.   Musculoskeletal: Positive for back pain and gait problem.       History of osteoporosis. Pain in the left knee and both hips.  Skin: Negative.   Allergic/Immunologic: Negative.   Neurological: Negative for dizziness, tremors, speech difficulty and light-headedness.  Hematological: Negative.   Psychiatric/Behavioral: Negative.     Filed Vitals:   11/26/13 0905  BP: 142/82  Pulse: 64  Weight: 121 lb (54.885 kg)   Body mass index is 21.44 kg/(m^2).  Physical Exam  Constitutional: She is oriented to person, place, and time. She appears distressed.  Elderly. Frail. Slow in movement due to back pain.  HENT:  Right Ear: External ear normal.  Left Ear: External ear normal.  Nose: Nose normal.  Mouth/Throat: Oropharynx is clear and moist. No oropharyngeal exudate.  Eyes: Conjunctivae and EOM are normal. Pupils are equal, round, and reactive to light.  Neck: No JVD present. No tracheal deviation present. No thyromegaly present.  Cardiovascular: Normal rate, regular rhythm, normal heart sounds and intact distal pulses.  Exam reveals no gallop and no friction rub.   No murmur heard. Pulmonary/Chest: No respiratory distress. She has no wheezes. She has no rales. She  exhibits no tenderness.  Abdominal: She exhibits no distension and no mass. There is no tenderness.  Musculoskeletal: Normal range of motion. She exhibits edema and tenderness (Lumbar area and left knee).  Lymphadenopathy:    She has no cervical adenopathy.  Neurological: She is alert and oriented to person, place, and time. She has normal reflexes.  10/22/2013 MMSE 30/30. Passed clock drawing  Skin: No rash noted. No erythema. No pallor.  Psychiatric: She has a normal mood and affect. Her behavior is normal. Judgment and thought content normal.     Labs reviewed: Telephone on 11/05/2013  Component Date Value Ref Range Status  . TSH 11/04/2013 4.01  0.41 - 5.90 uIU/mL Final  Lab on 10/25/2013  Component Date Value Ref Range Status  . Glucose 10/24/2013 91   Final  . BUN 10/24/2013 27* 4 - 21 mg/dL Final  . Creatinine 10/24/2013 1.2* 0.5 - 1.1 mg/dL Final  . Potassium 10/24/2013 4.2  3.4 - 5.3 mmol/L Final  . Sodium 10/24/2013 134* 137 - 147 mmol/L Final  . Triglycerides 10/24/2013 110  40 - 160 mg/dL Final  . Cholesterol 10/24/2013 129  0 - 200 mg/dL Final  . HDL 10/24/2013 63  35 - 70 mg/dL Final  . LDL Cholesterol 10/24/2013 44   Final  . Alkaline Phosphatase 10/24/2013 95  25 - 125 U/L Final  . ALT 10/24/2013 10  7 - 35 U/L Final  . AST 10/24/2013 16  13 - 35 U/L Final  . Bilirubin, Total 10/24/2013 0.8   Final  . TSH 10/24/2013 10.60* 0.41 - 5.90 uIU/mL Final      Assessment/Plan  1. Unspecified hypothyroidism compensated  2. Unspecified essential hypertension controlled  3. Senile osteoporosis Continue Prolia q10m  4. Edema observe  5. Osteoarthrosis, unspecified whether generalized or localized, forearm ; start diclofenac 50 mg bid - oxyCODONE-acetaminophen (PERCOCET/ROXICET) 5-325 MG per tablet; One every 4 hours if needed for severe pain  Dispense: 120 tablet; Refill: 0  6. Pure hypercholesterolemia -lipids, future

## 2013-12-18 ENCOUNTER — Other Ambulatory Visit: Payer: Self-pay | Admitting: Internal Medicine

## 2013-12-18 NOTE — Telephone Encounter (Signed)
[employer] [facility] 

## 2014-01-08 ENCOUNTER — Other Ambulatory Visit: Payer: Self-pay | Admitting: *Deleted

## 2014-01-08 ENCOUNTER — Other Ambulatory Visit: Payer: Self-pay | Admitting: Internal Medicine

## 2014-01-08 MED ORDER — OXYCODONE-ACETAMINOPHEN 5-325 MG PO TABS: ORAL_TABLET | ORAL | Status: DC

## 2014-01-08 NOTE — Telephone Encounter (Signed)
rx to be picked up this afternoon.

## 2014-01-09 ENCOUNTER — Telehealth: Payer: Self-pay

## 2014-01-09 NOTE — Telephone Encounter (Signed)
[facility] clinic nurse called Mr Haynes had called for Rx Oxycodone for his wife. They do not drive, could we mail it to pharm or have [facility] bring it to [facility] tomorrow. [place] [employer] I am going to [facility] this afternoon, could someone pick it then. She would be able to pick it up for them. Will come to [facility] at 2:00.

## 2014-01-29 ENCOUNTER — Other Ambulatory Visit: Payer: Self-pay | Admitting: *Deleted

## 2014-01-29 MED ORDER — ZOLPIDEM TARTRATE 10 MG PO TABS: ORAL_TABLET | ORAL | Status: DC

## 2014-01-29 NOTE — Telephone Encounter (Signed)
Bailey [name] 

## 2014-02-25 ENCOUNTER — Other Ambulatory Visit: Payer: Self-pay

## 2014-02-25 MED ORDER — ATENOLOL 25 MG PO TABS: ORAL_TABLET | ORAL | Status: DC

## 2014-03-01 DIAGNOSIS — Z23 Encounter for immunization: Secondary | ICD-10-CM | POA: Diagnosis not present

## 2014-03-12 ENCOUNTER — Other Ambulatory Visit: Payer: Self-pay | Admitting: *Deleted

## 2014-03-12 MED ORDER — ZOLPIDEM TARTRATE 10 MG PO TABS: ORAL_TABLET | ORAL | Status: DC

## 2014-03-12 NOTE — Telephone Encounter (Signed)
[employer] [facility] 

## 2014-03-17 DIAGNOSIS — E780 Pure hypercholesterolemia: Secondary | ICD-10-CM | POA: Diagnosis not present

## 2014-03-17 DIAGNOSIS — D649 Anemia, unspecified: Secondary | ICD-10-CM | POA: Diagnosis not present

## 2014-03-17 DIAGNOSIS — I10 Essential (primary) hypertension: Secondary | ICD-10-CM | POA: Diagnosis not present

## 2014-03-17 DIAGNOSIS — R609 Edema, unspecified: Secondary | ICD-10-CM | POA: Diagnosis not present

## 2014-03-17 LAB — CBC AND DIFFERENTIAL
HCT: 40 % (ref 36–46)
Hemoglobin: 14 g/dL (ref 12.0–16.0)
Platelets: 215 10*3/uL (ref 150–399)
WBC: 6.4 10^3/mL

## 2014-03-17 LAB — BASIC METABOLIC PANEL
BUN: 23 mg/dL — AB (ref 4–21)
Creatinine: 1.2 mg/dL — AB (ref 0.5–1.1)
GLUCOSE: 91 mg/dL
Potassium: 4.2 mmol/L (ref 3.4–5.3)
SODIUM: 136 mmol/L — AB (ref 137–147)

## 2014-03-17 LAB — HEPATIC FUNCTION PANEL
ALT: 12 U/L (ref 7–35)
AST: 11 U/L — AB (ref 13–35)
Alkaline Phosphatase: 55 U/L (ref 25–125)
Bilirubin, Total: 1.2 mg/dL

## 2014-03-17 LAB — LIPID PANEL
CHOLESTEROL: 181 mg/dL (ref 0–200)
HDL: 64 mg/dL (ref 35–70)
LDL Cholesterol: 91 mg/dL
LDL/HDL RATIO: 2.8
Triglycerides: 131 mg/dL (ref 40–160)

## 2014-03-25 ENCOUNTER — Non-Acute Institutional Stay: Payer: Medicare Other | Admitting: Internal Medicine

## 2014-03-25 ENCOUNTER — Encounter: Payer: Self-pay | Admitting: Internal Medicine

## 2014-03-25 VITALS — BP 114/62 | HR 60 | Temp 97.9°F | Wt 124.0 lb

## 2014-03-25 DIAGNOSIS — I10 Essential (primary) hypertension: Secondary | ICD-10-CM

## 2014-03-25 DIAGNOSIS — M150 Primary generalized (osteo)arthritis: Secondary | ICD-10-CM

## 2014-03-25 DIAGNOSIS — M1990 Unspecified osteoarthritis, unspecified site: Secondary | ICD-10-CM | POA: Insufficient documentation

## 2014-03-25 DIAGNOSIS — M169 Osteoarthritis of hip, unspecified: Secondary | ICD-10-CM

## 2014-03-25 DIAGNOSIS — R609 Edema, unspecified: Secondary | ICD-10-CM

## 2014-03-25 DIAGNOSIS — M19039 Primary osteoarthritis, unspecified wrist: Secondary | ICD-10-CM

## 2014-03-25 DIAGNOSIS — E7800 Pure hypercholesterolemia, unspecified: Secondary | ICD-10-CM

## 2014-03-25 DIAGNOSIS — M159 Polyosteoarthritis, unspecified: Secondary | ICD-10-CM

## 2014-03-25 DIAGNOSIS — G4700 Insomnia, unspecified: Secondary | ICD-10-CM | POA: Diagnosis not present

## 2014-03-25 DIAGNOSIS — D649 Anemia, unspecified: Secondary | ICD-10-CM | POA: Diagnosis not present

## 2014-03-25 MED ORDER — LIDOCAINE 5 % EX PTCH: 1.0000 | MEDICATED_PATCH | CUTANEOUS | Status: DC

## 2014-03-25 MED ORDER — OXYCODONE-ACETAMINOPHEN 5-325 MG PO TABS: ORAL_TABLET | ORAL | Status: DC

## 2014-03-25 NOTE — Progress Notes (Signed)
Patient ID: Bailey [name], female   DOB: [birth year], 80 y.o.   MRN: [id number]     [facility]     Place of Service: Clinic (12)    Allergies  Allergen Reactions  . Aspirin     Drop in body temp with large quantities, can tolerate low doses of aspirin  . Penicillins Swelling    Chief Complaint  Patient presents with  . Medical Management of Chronic Issues    blood pressure, thyroid. Here with husband    HPI:  Got sick after getting the flu injx. Had cough and chest rattle. Felt hot , but did not take temperature. She thinks she is getting better now.  Osteoarthrosis, forearm, unspecified laterality: Lidocaine helps  Edema: resolved  Anemia, unspecified anemia type: resolved  Essential hypertension: controlled  Pure hypercholesterolemia: controlled  Primary osteoarthritis involving multiple joints: has had increasing pains recently. Pains are worse in rainy cold weather. Has been sleeping in recliner, but this is painful and she thinks she can do better in hospital bed.  Insomnia is helped by zolpidem.    Medications: Patient's Medications  New Prescriptions   No medications on file  Previous Medications   AMLODIPINE (NORVASC) 5 MG TABLET    Take 5 mg by mouth. Take one tablet twice daily for blood pressure   ASPIRIN 81 MG TABLET    Take 81 mg by mouth daily.   ATENOLOL (TENORMIN) 25 MG TABLET    Take one tablet daily for blood pressure   CYANOCOBALAMIN (VITAMIN B-12 CR PO)    Take by mouth. Take one daily   DICLOFENAC (VOLTAREN) 50 MG EC TABLET    One twice daily to help arthritis pains   IRBESARTAN (AVAPRO) 300 MG TABLET    Take 300 mg by mouth. Take one tablet daily for blood pressure   LEVOTHYROXINE (SYNTHROID, LEVOTHROID) 112 MCG TABLET    Take 112 mcg by mouth daily before breakfast. For thyroid   ZOLPIDEM (AMBIEN) 10 MG TABLET    Take one tablet by mouth nightly at bedtime for sleep  Modified Medications   Modified Medication Previous Medication    LIDOCAINE (LIDODERM) 5 % lidocaine (LIDODERM) 5 %      Place 1 patch onto the skin daily. Use 2-3 patches a day for pain on in morning and off in evening    Place 1 patch onto the skin. Use 2-3 patches a day for pain on in morning and off in evening   OXYCODONE-ACETAMINOPHEN (PERCOCET/ROXICET) 5-325 MG PER TABLET oxyCODONE-acetaminophen (PERCOCET/ROXICET) 5-325 MG per tablet      TAKE 1 TABLET BY MOUTH EVERY 4 HOURS AS NEEDED FOR SEVERE PAIN    TAKE 1 TABLET BY MOUTH EVERY 4 HOURS AS NEEDED FOR SEVERE PAIN  Discontinued Medications   No medications on file     Review of Systems  Constitutional: Positive for activity change and fatigue. Negative for fever and unexpected weight change.  HENT:       Sinus allergies  Eyes: Positive for visual disturbance (Corrective lenses).  Respiratory: Positive for shortness of breath (On exertion).   Cardiovascular: Negative for chest pain, palpitations and leg swelling.  Gastrointestinal: Positive for constipation.       Flatulence. Hemorrhoids.  Endocrine: Negative.   Genitourinary: Negative.   Musculoskeletal: Positive for back pain and gait problem.       History of osteoporosis. Pain in the left knee and both hips.  Skin: Negative.   Allergic/Immunologic: Negative.   Neurological:  Negative for dizziness, tremors, speech difficulty and light-headedness.  Hematological: Negative.   Psychiatric/Behavioral: Negative.     Filed Vitals:   03/25/14 0859  BP: 114/62  Pulse: 60  Temp: 97.9 F (36.6 C)  TempSrc: Oral  Weight: 124 lb (56.246 kg)   Body mass index is 21.97 kg/(m^2).  Physical Exam  Constitutional: She is oriented to person, place, and time. She appears distressed.  Elderly. Frail. Slow in movement due to back pain.  HENT:  Right Ear: External ear normal.  Left Ear: External ear normal.  Nose: Nose normal.  Mouth/Throat: Oropharynx is clear and moist. No oropharyngeal exudate.  Eyes: Conjunctivae and EOM are normal. Pupils  are equal, round, and reactive to light.  Neck: No JVD present. No tracheal deviation present. No thyromegaly present.  Cardiovascular: Normal rate, regular rhythm, normal heart sounds and intact distal pulses.  Exam reveals no gallop and no friction rub.   No murmur heard. Pulmonary/Chest: No respiratory distress. She has no wheezes. She has no rales. She exhibits no tenderness.  Abdominal: She exhibits no distension and no mass. There is no tenderness.  Musculoskeletal: Normal range of motion. She exhibits tenderness (Lumbar area and left knee). She exhibits no edema.  Lymphadenopathy:    She has no cervical adenopathy.  Neurological: She is alert and oriented to person, place, and time. She has normal reflexes.  10/22/2013 MMSE 30/30. Passed clock drawing  Skin: No rash noted. No erythema. No pallor.  Psychiatric: She has a normal mood and affect. Her behavior is normal. Judgment and thought content normal.     Labs reviewed: Nursing Home on 03/25/2014  Component Date Value Ref Range Status  . Hemoglobin 03/17/2014 14.0  12.0 - 16.0 g/dL Final  . HCT 03/17/2014 40  36 - 46 % Final  . Platelets 03/17/2014 215  150 - 399 K/L Final  . WBC 03/17/2014 6.4   Final  . Glucose 03/17/2014 91   Final  . BUN 03/17/2014 23* 4 - 21 mg/dL Final  . Creatinine 03/17/2014 1.2* 0.5 - 1.1 mg/dL Final  . Potassium 03/17/2014 4.2  3.4 - 5.3 mmol/L Final  . Sodium 03/17/2014 136* 137 - 147 mmol/L Final  . LDl/HDL Ratio 03/17/2014 2.8   Final  . Triglycerides 03/17/2014 131  40 - 160 mg/dL Final  . Cholesterol 03/17/2014 181  0 - 200 mg/dL Final  . HDL 03/17/2014 64  35 - 70 mg/dL Final  . LDL Cholesterol 03/17/2014 91   Final  . Alkaline Phosphatase 03/17/2014 55  25 - 125 U/L Final  . ALT 03/17/2014 12  7 - 35 U/L Final  . AST 03/17/2014 11* 13 - 35 U/L Final  . Bilirubin, Total 03/17/2014 1.2   Final     Assessment/Plan  1. Osteoarthrosis, forearm, unspecified laterality - lidocaine  (LIDODERM) 5 %; Place 1 patch onto the skin daily. Use 2-3 patches a day for pain on in morning and off in evening  Dispense: 90 patch; Refill: 0  2. Edema RESOLVED  3. Anemia, unspecified anemia type Improved/ resolved - CBC and differential - Hepatic function panel  4. Essential hypertensioncontrolled - Basic metabolic panel  5. Pure hypercholesterolemia controlled - Lipid panel  6. Primary osteoarthritis involving multiple joints - oxyCODONE-acetaminophen (PERCOCET/ROXICET) 5-325 MG per tablet; TAKE 1 TABLET BY MOUTH EVERY 4 HOURS AS NEEDED FOR SEVERE PAIN  Dispense: 120 tablet; Refill: 0 -hospital bed  7. Insomnia -continue zolpidem  8. Osteoarthrosis, hip - lidocaine (LIDODERM) 5 %; Place  1 patch onto the skin daily. Use 2-3 patches a day for pain on in morning and off in evening  Dispense: 90 patch; Refill: 0

## 2014-03-26 ENCOUNTER — Other Ambulatory Visit: Payer: Self-pay | Admitting: *Deleted

## 2014-03-26 ENCOUNTER — Other Ambulatory Visit: Payer: Self-pay | Admitting: Internal Medicine

## 2014-03-26 MED ORDER — LEVOTHYROXINE SODIUM 112 MCG PO TABS: ORAL_TABLET | ORAL | Status: DC

## 2014-03-26 NOTE — Telephone Encounter (Signed)
Bailey [name] 

## 2014-04-16 ENCOUNTER — Other Ambulatory Visit: Payer: Self-pay | Admitting: Internal Medicine

## 2014-05-30 DIAGNOSIS — L03032 Cellulitis of left toe: Secondary | ICD-10-CM | POA: Diagnosis not present

## 2014-05-30 DIAGNOSIS — B351 Tinea unguium: Secondary | ICD-10-CM | POA: Diagnosis not present

## 2014-06-04 ENCOUNTER — Other Ambulatory Visit: Payer: Self-pay | Admitting: Internal Medicine

## 2014-06-04 ENCOUNTER — Other Ambulatory Visit: Payer: Self-pay | Admitting: *Deleted

## 2014-06-04 MED ORDER — DICLOFENAC SODIUM 50 MG PO TBEC: DELAYED_RELEASE_TABLET | ORAL | Status: DC

## 2014-06-04 NOTE — Telephone Encounter (Signed)
Bailey [name] 

## 2014-07-14 DIAGNOSIS — E039 Hypothyroidism, unspecified: Secondary | ICD-10-CM | POA: Diagnosis not present

## 2014-07-14 DIAGNOSIS — I10 Essential (primary) hypertension: Secondary | ICD-10-CM | POA: Diagnosis not present

## 2014-07-14 LAB — BASIC METABOLIC PANEL
BUN: 31 mg/dL — AB (ref 4–21)
CREATININE: 1.6 mg/dL — AB (ref 0.5–1.1)
Glucose: 114 mg/dL
Potassium: 4.3 mmol/L (ref 3.4–5.3)
Sodium: 132 mmol/L — AB (ref 137–147)

## 2014-07-14 LAB — TSH: TSH: 3.05 u[IU]/mL (ref 0.41–5.90)

## 2014-07-17 ENCOUNTER — Other Ambulatory Visit: Payer: Self-pay | Admitting: Internal Medicine

## 2014-07-17 NOTE — Telephone Encounter (Signed)
Bailey [name] and patient will pick up

## 2014-07-22 ENCOUNTER — Other Ambulatory Visit: Payer: Self-pay | Admitting: *Deleted

## 2014-07-22 ENCOUNTER — Non-Acute Institutional Stay: Payer: Medicare Other | Admitting: Internal Medicine

## 2014-07-22 ENCOUNTER — Encounter: Payer: Self-pay | Admitting: Internal Medicine

## 2014-07-22 VITALS — BP 100/64 | HR 60 | Temp 97.4°F | Wt 125.0 lb

## 2014-07-22 DIAGNOSIS — I10 Essential (primary) hypertension: Secondary | ICD-10-CM

## 2014-07-22 DIAGNOSIS — R609 Edema, unspecified: Secondary | ICD-10-CM | POA: Diagnosis not present

## 2014-07-22 DIAGNOSIS — M150 Primary generalized (osteo)arthritis: Secondary | ICD-10-CM

## 2014-07-22 DIAGNOSIS — E039 Hypothyroidism, unspecified: Secondary | ICD-10-CM | POA: Diagnosis not present

## 2014-07-22 DIAGNOSIS — E7800 Pure hypercholesterolemia, unspecified: Secondary | ICD-10-CM

## 2014-07-22 DIAGNOSIS — M159 Polyosteoarthritis, unspecified: Secondary | ICD-10-CM

## 2014-07-22 MED ORDER — OXYCODONE-ACETAMINOPHEN 5-325 MG PO TABS: ORAL_TABLET | ORAL | Status: DC

## 2014-07-22 NOTE — Telephone Encounter (Signed)
[employer] [facility] 

## 2014-07-22 NOTE — Progress Notes (Signed)
Patient ID: Bailey [name], female   DOB: Nov 22, 1926, 81 y.o.   MRN: [id number]    [facility]     Place of Service: Clinic (12)    Allergies  Allergen Reactions  . Aspirin     Drop in body temp with large quantities, can tolerate low doses of aspirin  . Penicillins Swelling    Chief Complaint  Patient presents with  . Medical Management of Chronic Issues    blood pressure, eanemia, edema, thyroid.   Here with husband    HPI:  Essential hypertension: Controlled  Pure hypercholesterolemia: No recent lab  Hypothyroidism, unspecified hypothyroidism type: Compensated  Edema: None present today  Primary osteoarthritis involving multiple joints: Arthritic pains continue to be the focus of her complaints.    Medications: Patient's Medications  New Prescriptions   No medications on file  Previous Medications   AMLODIPINE (NORVASC) 5 MG TABLET    TAKE 1 TABLET BY MOUTH TWICE DAILY TO CONTROL BLOOD PRESSURE   ASPIRIN 81 MG TABLET    Take 81 mg by mouth daily.   ATENOLOL (TENORMIN) 25 MG TABLET    Take one tablet daily for blood pressure   CYANOCOBALAMIN (VITAMIN B-12 CR PO)    Take by mouth. Take one daily   DICLOFENAC (VOLTAREN) 50 MG EC TABLET    Take one tablet by mouth twice daily to help arthritis pain   IRBESARTAN (AVAPRO) 300 MG TABLET    Take 300 mg by mouth. Take one tablet daily for blood pressure   LEVOTHYROXINE (SYNTHROID, LEVOTHROID) 112 MCG TABLET    Take one tablet by mouth once daily for thyroid supplement   LIDOCAINE (LIDODERM) 5 %    Place 1 patch onto the skin daily. Use 2-3 patches a day for pain on in morning and off in evening   ZOLPIDEM (AMBIEN) 10 MG TABLET    Take one tablet by mouth nightly at bedtime for sleep  Modified Medications   Modified Medication Previous Medication   OXYCODONE-ACETAMINOPHEN (PERCOCET/ROXICET) 5-325 MG PER TABLET oxyCODONE-acetaminophen (PERCOCET/ROXICET) 5-325 MG per tablet      Take one tablet by mouth every 4  hours as needed for pain    Take one tablet by mouth every 4 hours as needed for pain  Discontinued Medications   No medications on file     Review of Systems  Constitutional: Positive for activity change and fatigue. Negative for fever and unexpected weight change.  HENT:       Sinus allergies  Eyes: Positive for visual disturbance (Corrective lenses).  Respiratory: Positive for shortness of breath (On exertion).   Cardiovascular: Negative for chest pain, palpitations and leg swelling.  Gastrointestinal: Positive for constipation.       Flatulence. Hemorrhoids.  Endocrine: Negative.   Genitourinary: Negative.   Musculoskeletal: Positive for back pain and gait problem.       History of osteoporosis. Pain in the left knee and both hips.  Skin: Negative.   Allergic/Immunologic: Negative.   Neurological: Negative for dizziness, tremors, speech difficulty and light-headedness.  Hematological: Negative.   Psychiatric/Behavioral: Negative.     Filed Vitals:   07/22/14 0926  BP: 100/64  Pulse: 60  Temp: 97.4 F (36.3 C)  TempSrc: Oral  Weight: 125 lb (56.7 kg)   Body mass index is 22.15 kg/(m^2).  Physical Exam  Constitutional: She is oriented to person, place, and time. She appears distressed.  Elderly. Frail. Slow in movement due to back pain.  HENT:  Right  Ear: External ear normal.  Left Ear: External ear normal.  Nose: Nose normal.  Mouth/Throat: Oropharynx is clear and moist. No oropharyngeal exudate.  Eyes: Conjunctivae and EOM are normal. Pupils are equal, round, and reactive to light.  Neck: No JVD present. No tracheal deviation present. No thyromegaly present.  Cardiovascular: Normal rate, regular rhythm, normal heart sounds and intact distal pulses.  Exam reveals no gallop and no friction rub.   No murmur heard. Pulmonary/Chest: No respiratory distress. She has no wheezes. She has no rales. She exhibits no tenderness.  Abdominal: She exhibits no distension and no  mass. There is no tenderness.  Musculoskeletal: Normal range of motion. She exhibits tenderness (Lumbar area and left knee). She exhibits no edema.  Lymphadenopathy:    She has no cervical adenopathy.  Neurological: She is alert and oriented to person, place, and time. She has normal reflexes.  10/22/2013 MMSE 30/30. Passed clock drawing  Skin: No rash noted. No erythema. No pallor.  Psychiatric: She has a normal mood and affect. Her behavior is normal. Judgment and thought content normal.     Labs reviewed: Nursing Home on 07/22/2014  Component Date Value Ref Range Status  . Glucose 07/14/2014 114   Final  . BUN 07/14/2014 31* 4 - 21 mg/dL Final  . Creatinine 07/14/2014 1.6* 0.5 - 1.1 mg/dL Final  . Potassium 07/14/2014 4.3  3.4 - 5.3 mmol/L Final  . Sodium 07/14/2014 132* 137 - 147 mmol/L Final  . TSH 07/14/2014 3.05  0.41 - 5.90 uIU/mL Final     Assessment/Plan  1. Essential hypertension Controlled  2. Pure hypercholesterolemia Follow-up lipid panel in the future  3. Hypothyroidism, unspecified hypothyroidism type Compensated  4. Edema Absent today  5. Primary osteoarthritis involving multiple joints Continue oxycodone/APAP and diclofenac

## 2014-07-31 ENCOUNTER — Other Ambulatory Visit: Payer: Self-pay | Admitting: Internal Medicine

## 2014-08-21 ENCOUNTER — Other Ambulatory Visit: Payer: Self-pay | Admitting: Internal Medicine

## 2014-09-09 ENCOUNTER — Ambulatory Visit (INDEPENDENT_AMBULATORY_CARE_PROVIDER_SITE_OTHER): Payer: Medicare Other

## 2014-09-09 ENCOUNTER — Encounter: Payer: Self-pay | Admitting: Internal Medicine

## 2014-09-09 DIAGNOSIS — M810 Age-related osteoporosis without current pathological fracture: Secondary | ICD-10-CM | POA: Diagnosis not present

## 2014-09-09 MED ORDER — DENOSUMAB 60 MG/ML ~~LOC~~ SOLN
60.0000 mg | Freq: Once | SUBCUTANEOUS | Status: AC
  Administered 2014-09-09: 60 mg via SUBCUTANEOUS

## 2014-09-11 MED ORDER — DENOSUMAB 60 MG/ML ~~LOC~~ SOLN
60.0000 mg | Freq: Once | SUBCUTANEOUS | Status: AC
  Administered 2014-09-09: 60 mg via SUBCUTANEOUS

## 2014-09-11 NOTE — Addendum Note (Signed)
Addended by: Ripley Fraise on: 09/11/2014 01:08 PM   Modules accepted: Orders

## 2014-09-16 ENCOUNTER — Other Ambulatory Visit: Payer: Self-pay | Admitting: *Deleted

## 2014-09-16 MED ORDER — IRBESARTAN 300 MG PO TABS: ORAL_TABLET | ORAL | Status: DC

## 2014-09-16 NOTE — Telephone Encounter (Signed)
[employer]

## 2014-09-23 ENCOUNTER — Other Ambulatory Visit: Payer: Self-pay

## 2014-09-23 MED ORDER — OXYCODONE-ACETAMINOPHEN 5-325 MG PO TABS: ORAL_TABLET | ORAL | Status: DC

## 2014-09-23 NOTE — Telephone Encounter (Signed)
Husband came by [facility] clinic with Oxycodone empty bottle for Rx

## 2014-10-16 ENCOUNTER — Other Ambulatory Visit: Payer: Self-pay | Admitting: *Deleted

## 2014-10-16 MED ORDER — ZOLPIDEM TARTRATE 10 MG PO TABS: ORAL_TABLET | ORAL | Status: DC

## 2014-10-16 NOTE — Telephone Encounter (Signed)
[employer] [facility] 

## 2014-11-06 ENCOUNTER — Other Ambulatory Visit: Payer: Self-pay | Admitting: Internal Medicine

## 2014-11-10 DIAGNOSIS — I10 Essential (primary) hypertension: Secondary | ICD-10-CM | POA: Diagnosis not present

## 2014-11-10 DIAGNOSIS — E780 Pure hypercholesterolemia: Secondary | ICD-10-CM | POA: Diagnosis not present

## 2014-11-10 LAB — LIPID PANEL
Cholesterol: 175 mg/dL (ref 0–200)
HDL: 53 mg/dL (ref 35–70)
LDL Cholesterol: 87 mg/dL
Triglycerides: 173 mg/dL — AB (ref 40–160)

## 2014-11-10 LAB — BASIC METABOLIC PANEL
BUN: 28 mg/dL — AB (ref 4–21)
Creatinine: 1.2 mg/dL — AB (ref 0.5–1.1)
Glucose: 85 mg/dL
Potassium: 4.5 mmol/L (ref 3.4–5.3)
SODIUM: 137 mmol/L (ref 137–147)

## 2014-11-10 LAB — HEPATIC FUNCTION PANEL
ALK PHOS: 50 U/L (ref 25–125)
ALT: 11 U/L (ref 7–35)
AST: 16 U/L (ref 13–35)
Bilirubin, Total: 0.8 mg/dL

## 2014-11-13 ENCOUNTER — Other Ambulatory Visit: Payer: Self-pay | Admitting: Internal Medicine

## 2014-11-18 ENCOUNTER — Non-Acute Institutional Stay: Payer: Medicare Other | Admitting: Internal Medicine

## 2014-11-18 ENCOUNTER — Encounter: Payer: Self-pay | Admitting: Internal Medicine

## 2014-11-18 VITALS — BP 134/72 | HR 60 | Temp 97.4°F | Wt 130.0 lb

## 2014-11-18 DIAGNOSIS — E1122 Type 2 diabetes mellitus with diabetic chronic kidney disease: Secondary | ICD-10-CM

## 2014-11-18 DIAGNOSIS — I10 Essential (primary) hypertension: Secondary | ICD-10-CM

## 2014-11-18 DIAGNOSIS — E039 Hypothyroidism, unspecified: Secondary | ICD-10-CM

## 2014-11-18 DIAGNOSIS — N182 Chronic kidney disease, stage 2 (mild): Secondary | ICD-10-CM

## 2014-11-18 DIAGNOSIS — M169 Osteoarthritis of hip, unspecified: Secondary | ICD-10-CM

## 2014-11-18 DIAGNOSIS — R609 Edema, unspecified: Secondary | ICD-10-CM | POA: Diagnosis not present

## 2014-11-18 DIAGNOSIS — E7800 Pure hypercholesterolemia, unspecified: Secondary | ICD-10-CM

## 2014-11-18 HISTORY — DX: Type 2 diabetes mellitus with diabetic chronic kidney disease: E11.22

## 2014-11-18 MED ORDER — HYDROCODONE-ACETAMINOPHEN 10-325 MG PO TABS: ORAL_TABLET | ORAL | Status: DC

## 2014-11-18 NOTE — Progress Notes (Signed)
Patient ID: Bailey [name], female   DOB: 1927/04/24, 81 y.o.   MRN: [id number]    [facility]     Place of Service: Clinic (12)    Allergies  Allergen Reactions  . Aspirin     Drop in body temp with large quantities, can tolerate low doses of aspirin  . Penicillins Swelling    Chief Complaint  Patient presents with  . Medical Management of Chronic Issues    blood pressure, cholesterol, thyroid, arthritis. Here with husband  . Knee Pain    more than usual, yesterday     HPI:  Hypothyroidism, unspecified hypothyroidism type: Continues levothyroxine. No complaints. Denies tachycardia or chest discomfort.  Essential hypertension: Controlled  Edema: Controlled  Pure hypercholesterolemia: Controlled  CKD stage 2: Mild elevations in BUN and serum creatinine.  Osteoarthrosis, hip: Increasing discomfort in the left hip. Patient had a fall in the last year. Hip is ever quite recovered. She has not had x-rays of left hip.    Medications: Patient's Medications  New Prescriptions   No medications on file  Previous Medications   AMLODIPINE (NORVASC) 5 MG TABLET    TAKE 1 TABLET BY MOUTH TWICE DAILY TO CONTROL BLOOD PRESSURE   ASPIRIN 81 MG TABLET    Take 81 mg by mouth daily.   ATENOLOL (TENORMIN) 25 MG TABLET    Take one tablet daily for blood pressure   CYANOCOBALAMIN (VITAMIN B-12 CR PO)    Take by mouth. Take one daily   DICLOFENAC (VOLTAREN) 50 MG EC TABLET    Take one tablet by mouth twice daily to help arthritis pain   IRBESARTAN (AVAPRO) 300 MG TABLET    Take one tablet by mouth once daily for blood pressure   LEVOTHYROXINE (SYNTHROID, LEVOTHROID) 112 MCG TABLET    Take one tablet by mouth once daily for thyroid supplement   LIDOCAINE (LIDODERM) 5 %    APPLY 3 PATCHES DAILY FOR 12 HOURS ON AND 12 HOURS OFF AS DIRECTED (APPLY IN THE MORNING AND REMOVE AT NIGHT)   OXYCODONE-ACETAMINOPHEN (PERCOCET/ROXICET) 5-325 MG PER TABLET    Take one tablet by mouth every 4  hours as needed for pain   ZOLPIDEM (AMBIEN) 10 MG TABLET    Take one tablet by mouth nightly at bedtime for sleep  Modified Medications   No medications on file  Discontinued Medications   DICLOFENAC (VOLTAREN) 50 MG EC TABLET    TAKE ONE TABLET BY MOUTH TWICE DAILY TO HELP ARTHRITIS PAINS   LEVOTHYROXINE (SYNTHROID, LEVOTHROID) 112 MCG TABLET    TAKE 1 TABLET BY MOUTH DAILY FOR THYROID SUPPLEMENT     Review of Systems  Constitutional: Positive for activity change and fatigue. Negative for fever and unexpected weight change.  HENT:       Sinus allergies  Eyes: Positive for visual disturbance (Corrective lenses).  Respiratory: Positive for shortness of breath (On exertion).   Cardiovascular: Negative for chest pain, palpitations and leg swelling.  Gastrointestinal: Positive for constipation.       Flatulence. Hemorrhoids.  Endocrine: Negative.   Genitourinary: Negative.   Musculoskeletal: Positive for back pain and gait problem.       History of osteoporosis. Pain in the left knee and both hips.  Skin: Negative.   Allergic/Immunologic: Negative.   Neurological: Negative for dizziness, tremors, speech difficulty and light-headedness.  Hematological: Negative.   Psychiatric/Behavioral: Negative.     Filed Vitals:   11/18/14 0907 11/18/14 0922  BP: 144/78 134/72  Pulse:  60 60  Temp: 97.4 F (36.3 C)   TempSrc: Oral   Weight: 130 lb (58.968 kg)    Body mass index is 23.03 kg/(m^2).  Physical Exam  Constitutional: She is oriented to person, place, and time. She appears distressed.  Elderly. Frail. Slow in movement due to back pain.  HENT:  Right Ear: External ear normal.  Left Ear: External ear normal.  Nose: Nose normal.  Mouth/Throat: Oropharynx is clear and moist. No oropharyngeal exudate.  Eyes: Conjunctivae and EOM are normal. Pupils are equal, round, and reactive to light.  Neck: No JVD present. No tracheal deviation present. No thyromegaly present.    Cardiovascular: Normal rate, regular rhythm, normal heart sounds and intact distal pulses.  Exam reveals no gallop and no friction rub.   No murmur heard. Pulmonary/Chest: No respiratory distress. She has no wheezes. She has no rales. She exhibits no tenderness.  Abdominal: She exhibits no distension and no mass. There is no tenderness.  Musculoskeletal: Normal range of motion. She exhibits tenderness (Lumbar area and left knee). She exhibits no edema.  Lymphadenopathy:    She has no cervical adenopathy.  Neurological: She is alert and oriented to person, place, and time. She has normal reflexes.  10/22/2013 MMSE 30/30. Passed clock drawing  Skin: No rash noted. No erythema. No pallor.  Psychiatric: She has a normal mood and affect. Her behavior is normal. Judgment and thought content normal.     Labs reviewed: Nursing Home on 11/18/2014  Component Date Value Ref Range Status  . Glucose 11/10/2014 85   Final  . BUN 11/10/2014 28* 4 - 21 mg/dL Final  . Creatinine 11/10/2014 1.2* .5 - 1.1 mg/dL Final  . Potassium 11/10/2014 4.5  3.4 - 5.3 mmol/L Final  . Sodium 11/10/2014 137  137 - 147 mmol/L Final  . Triglycerides 11/10/2014 173* 40 - 160 mg/dL Final  . Cholesterol 11/10/2014 175  0 - 200 mg/dL Final  . HDL 11/10/2014 53  35 - 70 mg/dL Final  . LDL Cholesterol 11/10/2014 87   Final  . Alkaline Phosphatase 11/10/2014 50  25 - 125 U/L Final  . ALT 11/10/2014 11  7 - 35 U/L Final  . AST 11/10/2014 16  13 - 35 U/L Final  . Bilirubin, Total 11/10/2014 0.8   Final     Assessment/Plan  1. Hypothyroidism, unspecified hypothyroidism type - TSH; Future  2. Essential hypertension - Basic metabolic panel; Future  3. Edema improved  4. Pure hypercholesterolemia controlled  5. Osteoarthrosis, hip stop oxycodone - HYDROcodone-acetaminophen (NORCO) 10-325 MG per tablet; One up to 4 times daily if needed for pain  Dispense: 120 tablet; Refill: 0  6. CKD (chronic kidney disease)  stage 2, GFR 60-89 ml/min Monitor -BMP, future

## 2014-11-20 ENCOUNTER — Other Ambulatory Visit: Payer: Self-pay | Admitting: Internal Medicine

## 2014-12-11 ENCOUNTER — Other Ambulatory Visit: Payer: Self-pay | Admitting: Internal Medicine

## 2014-12-18 ENCOUNTER — Other Ambulatory Visit: Payer: Self-pay | Admitting: Internal Medicine

## 2014-12-25 ENCOUNTER — Other Ambulatory Visit: Payer: Self-pay | Admitting: Internal Medicine

## 2015-01-01 ENCOUNTER — Other Ambulatory Visit: Payer: Self-pay | Admitting: Internal Medicine

## 2015-01-06 ENCOUNTER — Other Ambulatory Visit: Payer: Self-pay | Admitting: Internal Medicine

## 2015-01-06 DIAGNOSIS — M159 Polyosteoarthritis, unspecified: Secondary | ICD-10-CM

## 2015-01-06 DIAGNOSIS — M150 Primary generalized (osteo)arthritis: Principal | ICD-10-CM

## 2015-01-06 MED ORDER — LIDOCAINE 5 % EX PTCH: MEDICATED_PATCH | CUTANEOUS | Status: DC

## 2015-01-06 MED ORDER — OXYCODONE-ACETAMINOPHEN 5-325 MG PO TABS: ORAL_TABLET | ORAL | Status: DC

## 2015-01-08 ENCOUNTER — Telehealth: Payer: Self-pay

## 2015-01-08 NOTE — Telephone Encounter (Signed)
PA was received via manual fax from [employer] for Lidocaine. I completed PA via [email or web address]  PA was completed online and faxed to patient's plan. I am awaiting response from the insurance company.

## 2015-01-09 NOTE — Telephone Encounter (Signed)
Medication was denied, denial papers placed on ledge for review.  Please advise

## 2015-01-09 NOTE — Telephone Encounter (Signed)
Advised patient of the denial. There are some over-the-counter preparations available now with lidocaine. She may wish to try Aspercreme with lidocaine patches.

## 2015-01-09 NOTE — Telephone Encounter (Signed)
Discussed with patient, patient verbalized understanding of Dr.Green's recommendations

## 2015-01-15 ENCOUNTER — Other Ambulatory Visit: Payer: Self-pay | Admitting: Internal Medicine

## 2015-02-12 ENCOUNTER — Other Ambulatory Visit: Payer: Self-pay | Admitting: Internal Medicine

## 2015-02-12 ENCOUNTER — Other Ambulatory Visit: Payer: Self-pay | Admitting: *Deleted

## 2015-02-12 DIAGNOSIS — Z23 Encounter for immunization: Secondary | ICD-10-CM | POA: Diagnosis not present

## 2015-02-12 MED ORDER — ZOLPIDEM TARTRATE 10 MG PO TABS: ORAL_TABLET | ORAL | Status: DC

## 2015-02-12 MED ORDER — ATENOLOL 25 MG PO TABS: ORAL_TABLET | ORAL | Status: DC

## 2015-02-12 NOTE — Telephone Encounter (Signed)
[employer] [facility] 

## 2015-02-17 ENCOUNTER — Other Ambulatory Visit: Payer: Self-pay | Admitting: *Deleted

## 2015-02-17 MED ORDER — OXYCODONE-ACETAMINOPHEN 5-325 MG PO TABS: ORAL_TABLET | ORAL | Status: DC

## 2015-02-17 NOTE — Telephone Encounter (Signed)
Husband came to the clinic to get a refill on medication for his spouse, Dr. Nyoka Cowden wrote a script for the patient for her oxycodone(Percocet) quantity of 180.

## 2015-03-09 DIAGNOSIS — I10 Essential (primary) hypertension: Secondary | ICD-10-CM | POA: Diagnosis not present

## 2015-03-09 DIAGNOSIS — E039 Hypothyroidism, unspecified: Secondary | ICD-10-CM | POA: Diagnosis not present

## 2015-03-09 LAB — BASIC METABOLIC PANEL
BUN: 26 mg/dL — AB (ref 4–21)
Creatinine: 1.2 mg/dL — AB (ref 0.5–1.1)
Glucose: 94 mg/dL
POTASSIUM: 4.4 mmol/L (ref 3.4–5.3)
SODIUM: 135 mmol/L — AB (ref 137–147)

## 2015-03-09 LAB — TSH: TSH: 2.62 u[IU]/mL (ref 0.41–5.90)

## 2015-03-10 ENCOUNTER — Encounter: Payer: Self-pay | Admitting: *Deleted

## 2015-03-17 ENCOUNTER — Encounter (INDEPENDENT_AMBULATORY_CARE_PROVIDER_SITE_OTHER): Payer: Medicare Other | Admitting: Internal Medicine

## 2015-03-17 ENCOUNTER — Encounter: Payer: Self-pay | Admitting: Internal Medicine

## 2015-03-17 ENCOUNTER — Ambulatory Visit: Payer: Self-pay

## 2015-03-17 VITALS — BP 138/70 | HR 59 | Temp 98.0°F | Resp 20

## 2015-03-17 DIAGNOSIS — M150 Primary generalized (osteo)arthritis: Secondary | ICD-10-CM

## 2015-03-17 DIAGNOSIS — M159 Polyosteoarthritis, unspecified: Secondary | ICD-10-CM

## 2015-03-17 MED ORDER — ZOLPIDEM TARTRATE 10 MG PO TABS: ORAL_TABLET | ORAL | Status: DC

## 2015-03-17 MED ORDER — DENOSUMAB 60 MG/ML ~~LOC~~ SOLN
60.0000 mg | Freq: Once | SUBCUTANEOUS | Status: AC
  Administered 2015-03-17: 60 mg via SUBCUTANEOUS

## 2015-03-18 ENCOUNTER — Inpatient Hospital Stay (HOSPITAL_COMMUNITY): Payer: Medicare Other

## 2015-03-18 ENCOUNTER — Encounter (HOSPITAL_COMMUNITY)
Admission: EM | Disposition: A | Discharge status: Skilled Nursing Facility | Payer: Self-pay | Source: Home / Self Care | Attending: Internal Medicine

## 2015-03-18 ENCOUNTER — Encounter (HOSPITAL_COMMUNITY): Payer: Self-pay | Admitting: *Deleted

## 2015-03-18 ENCOUNTER — Inpatient Hospital Stay (HOSPITAL_COMMUNITY)
Admission: EM | Admit: 2015-03-18 | Discharge: 2015-03-23 | DRG: 956 | Disposition: A | Discharge status: Skilled Nursing Facility | Payer: Medicare Other | Attending: Internal Medicine | Admitting: Internal Medicine

## 2015-03-18 ENCOUNTER — Emergency Department (HOSPITAL_COMMUNITY): Payer: Medicare Other

## 2015-03-18 ENCOUNTER — Inpatient Hospital Stay (HOSPITAL_COMMUNITY): Payer: Medicare Other | Admitting: Certified Registered Nurse Anesthetist

## 2015-03-18 DIAGNOSIS — Y92009 Unspecified place in unspecified non-institutional (private) residence as the place of occurrence of the external cause: Secondary | ICD-10-CM

## 2015-03-18 DIAGNOSIS — W19XXXA Unspecified fall, initial encounter: Secondary | ICD-10-CM

## 2015-03-18 DIAGNOSIS — Z79891 Long term (current) use of opiate analgesic: Secondary | ICD-10-CM

## 2015-03-18 DIAGNOSIS — M25551 Pain in right hip: Secondary | ICD-10-CM | POA: Diagnosis not present

## 2015-03-18 DIAGNOSIS — Z79899 Other long term (current) drug therapy: Secondary | ICD-10-CM

## 2015-03-18 DIAGNOSIS — D649 Anemia, unspecified: Secondary | ICD-10-CM | POA: Diagnosis not present

## 2015-03-18 DIAGNOSIS — S32591A Other specified fracture of right pubis, initial encounter for closed fracture: Secondary | ICD-10-CM | POA: Diagnosis present

## 2015-03-18 DIAGNOSIS — M810 Age-related osteoporosis without current pathological fracture: Secondary | ICD-10-CM | POA: Diagnosis present

## 2015-03-18 DIAGNOSIS — S72141A Displaced intertrochanteric fracture of right femur, initial encounter for closed fracture: Principal | ICD-10-CM | POA: Diagnosis present

## 2015-03-18 DIAGNOSIS — D638 Anemia in other chronic diseases classified elsewhere: Secondary | ICD-10-CM | POA: Diagnosis present

## 2015-03-18 DIAGNOSIS — K5900 Constipation, unspecified: Secondary | ICD-10-CM | POA: Diagnosis not present

## 2015-03-18 DIAGNOSIS — R112 Nausea with vomiting, unspecified: Secondary | ICD-10-CM | POA: Diagnosis not present

## 2015-03-18 DIAGNOSIS — N182 Chronic kidney disease, stage 2 (mild): Secondary | ICD-10-CM | POA: Diagnosis present

## 2015-03-18 DIAGNOSIS — D62 Acute posthemorrhagic anemia: Secondary | ICD-10-CM | POA: Diagnosis not present

## 2015-03-18 DIAGNOSIS — E559 Vitamin D deficiency, unspecified: Secondary | ICD-10-CM | POA: Diagnosis present

## 2015-03-18 DIAGNOSIS — Z880 Allergy status to penicillin: Secondary | ICD-10-CM | POA: Diagnosis not present

## 2015-03-18 DIAGNOSIS — Z09 Encounter for follow-up examination after completed treatment for conditions other than malignant neoplasm: Secondary | ICD-10-CM

## 2015-03-18 DIAGNOSIS — I129 Hypertensive chronic kidney disease with stage 1 through stage 4 chronic kidney disease, or unspecified chronic kidney disease: Secondary | ICD-10-CM | POA: Diagnosis present

## 2015-03-18 DIAGNOSIS — S72001A Fracture of unspecified part of neck of right femur, initial encounter for closed fracture: Secondary | ICD-10-CM | POA: Diagnosis not present

## 2015-03-18 DIAGNOSIS — Z7982 Long term (current) use of aspirin: Secondary | ICD-10-CM

## 2015-03-18 DIAGNOSIS — S32401A Unspecified fracture of right acetabulum, initial encounter for closed fracture: Secondary | ICD-10-CM | POA: Diagnosis present

## 2015-03-18 DIAGNOSIS — I739 Peripheral vascular disease, unspecified: Secondary | ICD-10-CM | POA: Diagnosis not present

## 2015-03-18 DIAGNOSIS — E039 Hypothyroidism, unspecified: Secondary | ICD-10-CM | POA: Diagnosis present

## 2015-03-18 DIAGNOSIS — J309 Allergic rhinitis, unspecified: Secondary | ICD-10-CM | POA: Diagnosis present

## 2015-03-18 DIAGNOSIS — Z87891 Personal history of nicotine dependence: Secondary | ICD-10-CM | POA: Diagnosis not present

## 2015-03-18 DIAGNOSIS — W010XXA Fall on same level from slipping, tripping and stumbling without subsequent striking against object, initial encounter: Secondary | ICD-10-CM | POA: Diagnosis present

## 2015-03-18 DIAGNOSIS — M179 Osteoarthritis of knee, unspecified: Secondary | ICD-10-CM | POA: Diagnosis present

## 2015-03-18 DIAGNOSIS — S329XXA Fracture of unspecified parts of lumbosacral spine and pelvis, initial encounter for closed fracture: Secondary | ICD-10-CM

## 2015-03-18 DIAGNOSIS — M1610 Unilateral primary osteoarthritis, unspecified hip: Secondary | ICD-10-CM | POA: Diagnosis present

## 2015-03-18 DIAGNOSIS — I10 Essential (primary) hypertension: Secondary | ICD-10-CM | POA: Diagnosis not present

## 2015-03-18 DIAGNOSIS — S72101D Unspecified trochanteric fracture of right femur, subsequent encounter for closed fracture with routine healing: Secondary | ICD-10-CM | POA: Diagnosis not present

## 2015-03-18 DIAGNOSIS — E539 Vitamin B deficiency, unspecified: Secondary | ICD-10-CM | POA: Diagnosis present

## 2015-03-18 DIAGNOSIS — S72101A Unspecified trochanteric fracture of right femur, initial encounter for closed fracture: Secondary | ICD-10-CM | POA: Diagnosis present

## 2015-03-18 DIAGNOSIS — R262 Difficulty in walking, not elsewhere classified: Secondary | ICD-10-CM | POA: Diagnosis not present

## 2015-03-18 DIAGNOSIS — T148 Other injury of unspecified body region: Secondary | ICD-10-CM | POA: Diagnosis not present

## 2015-03-18 DIAGNOSIS — Z886 Allergy status to analgesic agent status: Secondary | ICD-10-CM

## 2015-03-18 DIAGNOSIS — E7800 Pure hypercholesterolemia, unspecified: Secondary | ICD-10-CM | POA: Diagnosis present

## 2015-03-18 DIAGNOSIS — Z853 Personal history of malignant neoplasm of breast: Secondary | ICD-10-CM

## 2015-03-18 DIAGNOSIS — M21251 Flexion deformity, right hip: Secondary | ICD-10-CM | POA: Diagnosis not present

## 2015-03-18 DIAGNOSIS — E1122 Type 2 diabetes mellitus with diabetic chronic kidney disease: Secondary | ICD-10-CM | POA: Diagnosis present

## 2015-03-18 DIAGNOSIS — R2681 Unsteadiness on feet: Secondary | ICD-10-CM | POA: Diagnosis not present

## 2015-03-18 DIAGNOSIS — M6281 Muscle weakness (generalized): Secondary | ICD-10-CM | POA: Diagnosis not present

## 2015-03-18 HISTORY — DX: Polyneuropathy, unspecified: G62.9

## 2015-03-18 HISTORY — PX: FEMUR IM NAIL: SHX1597

## 2015-03-18 HISTORY — DX: Insomnia, unspecified: G47.00

## 2015-03-18 HISTORY — DX: Other specified abnormal immunological findings in serum: R76.8

## 2015-03-18 LAB — BASIC METABOLIC PANEL
ANION GAP: 8 (ref 5–15)
BUN: 33 mg/dL — ABNORMAL HIGH (ref 6–20)
CALCIUM: 8.6 mg/dL — AB (ref 8.9–10.3)
CO2: 25 mmol/L (ref 22–32)
Chloride: 100 mmol/L — ABNORMAL LOW (ref 101–111)
Creatinine, Ser: 1.2 mg/dL — ABNORMAL HIGH (ref 0.44–1.00)
GFR calc Af Amer: 46 mL/min — ABNORMAL LOW (ref >60)
GFR, EST NON AFRICAN AMERICAN: 40 mL/min — AB (ref >60)
Glucose, Bld: 133 mg/dL — ABNORMAL HIGH (ref 65–99)
Potassium: 3.8 mmol/L (ref 3.5–5.1)
Sodium: 133 mmol/L — ABNORMAL LOW (ref 135–145)

## 2015-03-18 LAB — CBC WITH DIFFERENTIAL/PLATELET
BASOS ABS: 0 10*3/uL (ref 0.0–0.1)
BASOS PCT: 0 %
EOS PCT: 1 %
Eosinophils Absolute: 0.1 10*3/uL (ref 0.0–0.7)
HEMATOCRIT: 34.1 % — AB (ref 36.0–46.0)
Hemoglobin: 11.8 g/dL — ABNORMAL LOW (ref 12.0–15.0)
Lymphocytes Relative: 12 %
Lymphs Abs: 1.2 10*3/uL (ref 0.7–4.0)
MCH: 32.2 pg (ref 26.0–34.0)
MCHC: 34.6 g/dL (ref 30.0–36.0)
MCV: 92.9 fL (ref 78.0–100.0)
MONO ABS: 0.5 10*3/uL (ref 0.1–1.0)
Monocytes Relative: 5 %
NEUTROS ABS: 8 10*3/uL — AB (ref 1.7–7.7)
Neutrophils Relative %: 82 %
PLATELETS: 165 10*3/uL (ref 150–400)
RBC: 3.67 MIL/uL — ABNORMAL LOW (ref 3.87–5.11)
RDW: 12.1 % (ref 11.5–15.5)
WBC: 9.9 10*3/uL (ref 4.0–10.5)

## 2015-03-18 SURGERY — INSERTION, INTRAMEDULLARY ROD, FEMUR
Anesthesia: General | Site: Hip | Laterality: Right

## 2015-03-18 MED ORDER — MORPHINE SULFATE (PF) 4 MG/ML IV SOLN
4.0000 mg | Freq: Once | INTRAVENOUS | Status: AC
  Administered 2015-03-18: 4 mg via INTRAVENOUS
  Filled 2015-03-18: qty 1

## 2015-03-18 MED ORDER — VITAMIN D3 25 MCG (1000 UNIT) PO TABS
3000.0000 [IU] | ORAL_TABLET | Freq: Every day | ORAL | Status: DC
  Administered 2015-03-19 – 2015-03-23 (×5): 3000 [IU] via ORAL
  Filled 2015-03-18 (×6): qty 3

## 2015-03-18 MED ORDER — PROMETHAZINE-PHENYLEPHRINE 6.25-5 MG/5ML PO SYRP
5.0000 mL | ORAL_SOLUTION | ORAL | Status: DC | PRN
  Filled 2015-03-18: qty 5

## 2015-03-18 MED ORDER — OXYCODONE-ACETAMINOPHEN 5-325 MG PO TABS: 1.0000 | ORAL_TABLET | ORAL | Status: DC | PRN

## 2015-03-18 MED ORDER — PROMETHAZINE HCL 25 MG/ML IJ SOLN
INTRAMUSCULAR | Status: AC
  Administered 2015-03-18: 6.25 mg
  Filled 2015-03-18: qty 1

## 2015-03-18 MED ORDER — ASPIRIN 81 MG PO CHEW
81.0000 mg | CHEWABLE_TABLET | Freq: Every day | ORAL | Status: DC
  Filled 2015-03-18: qty 1

## 2015-03-18 MED ORDER — EPHEDRINE SULFATE 50 MG/ML IJ SOLN
INTRAMUSCULAR | Status: AC
  Filled 2015-03-18: qty 1

## 2015-03-18 MED ORDER — ACETAMINOPHEN 325 MG PO TABS
650.0000 mg | ORAL_TABLET | Freq: Four times a day (QID) | ORAL | Status: DC | PRN
  Administered 2015-03-19 – 2015-03-22 (×2): 650 mg via ORAL
  Filled 2015-03-18 (×2): qty 2

## 2015-03-18 MED ORDER — LIDOCAINE HCL (CARDIAC) 20 MG/ML IV SOLN
INTRAVENOUS | Status: DC | PRN
  Administered 2015-03-18: 100 mg via INTRAVENOUS

## 2015-03-18 MED ORDER — FENTANYL CITRATE (PF) 250 MCG/5ML IJ SOLN
INTRAMUSCULAR | Status: AC
  Filled 2015-03-18: qty 25

## 2015-03-18 MED ORDER — GLYCOPYRROLATE 0.2 MG/ML IJ SOLN
INTRAMUSCULAR | Status: DC | PRN
  Administered 2015-03-18: 0.6 mg via INTRAVENOUS

## 2015-03-18 MED ORDER — FENTANYL CITRATE (PF) 100 MCG/2ML IJ SOLN
INTRAMUSCULAR | Status: DC | PRN
  Administered 2015-03-18: 25 ug via INTRAVENOUS
  Administered 2015-03-18: 50 ug via INTRAVENOUS
  Administered 2015-03-18 (×3): 25 ug via INTRAVENOUS
  Administered 2015-03-18: 50 ug via INTRAVENOUS
  Administered 2015-03-18 (×2): 25 ug via INTRAVENOUS

## 2015-03-18 MED ORDER — SODIUM CHLORIDE 0.9 % IR SOLN
Status: DC | PRN
  Administered 2015-03-18: 1

## 2015-03-18 MED ORDER — MORPHINE SULFATE (PF) 2 MG/ML IV SOLN
0.5000 mg | INTRAVENOUS | Status: DC | PRN
  Administered 2015-03-18 – 2015-03-20 (×9): 0.5 mg via INTRAVENOUS
  Filled 2015-03-18 (×10): qty 1

## 2015-03-18 MED ORDER — OXYCODONE HCL 5 MG PO TABS: 5.0000 mg | ORAL_TABLET | ORAL | Status: DC | PRN

## 2015-03-18 MED ORDER — CLINDAMYCIN PHOSPHATE 900 MG/50ML IV SOLN
900.0000 mg | Freq: Once | INTRAVENOUS | Status: AC
  Administered 2015-03-18: 900 mg via INTRAVENOUS

## 2015-03-18 MED ORDER — ROCURONIUM BROMIDE 100 MG/10ML IV SOLN
INTRAVENOUS | Status: AC
  Filled 2015-03-18: qty 1

## 2015-03-18 MED ORDER — SODIUM CHLORIDE 0.9 % IJ SOLN
INTRAMUSCULAR | Status: AC
  Filled 2015-03-18: qty 10

## 2015-03-18 MED ORDER — AMLODIPINE BESYLATE 5 MG PO TABS
5.0000 mg | ORAL_TABLET | Freq: Two times a day (BID) | ORAL | Status: DC
  Filled 2015-03-18 (×4): qty 1

## 2015-03-18 MED ORDER — ISOPROPYL ALCOHOL 70 % SOLN
Status: AC
  Filled 2015-03-18: qty 480

## 2015-03-18 MED ORDER — MORPHINE SULFATE (PF) 2 MG/ML IV SOLN
0.5000 mg | INTRAVENOUS | Status: DC | PRN
  Administered 2015-03-18 (×4): 2 mg via INTRAVENOUS
  Filled 2015-03-18 (×4): qty 1

## 2015-03-18 MED ORDER — SUCCINYLCHOLINE CHLORIDE 20 MG/ML IJ SOLN
INTRAMUSCULAR | Status: DC | PRN
  Administered 2015-03-18: 80 mg via INTRAVENOUS

## 2015-03-18 MED ORDER — FENTANYL CITRATE (PF) 100 MCG/2ML IJ SOLN
INTRAMUSCULAR | Status: AC
  Filled 2015-03-18: qty 2

## 2015-03-18 MED ORDER — ACETAMINOPHEN 650 MG RE SUPP: 650.0000 mg | Freq: Four times a day (QID) | RECTAL | Status: DC | PRN

## 2015-03-18 MED ORDER — ASPIRIN EC 325 MG PO TBEC
325.0000 mg | DELAYED_RELEASE_TABLET | Freq: Two times a day (BID) | ORAL | Status: DC
  Administered 2015-03-19 – 2015-03-23 (×9): 325 mg via ORAL
  Filled 2015-03-18 (×12): qty 1

## 2015-03-18 MED ORDER — MORPHINE SULFATE (PF) 2 MG/ML IV SOLN: 0.5000 mg | INTRAVENOUS | Status: DC | PRN

## 2015-03-18 MED ORDER — FENTANYL CITRATE (PF) 100 MCG/2ML IJ SOLN
25.0000 ug | INTRAMUSCULAR | Status: DC | PRN
  Administered 2015-03-18: 25 ug via INTRAVENOUS
  Administered 2015-03-18: 50 ug via INTRAVENOUS
  Administered 2015-03-18: 25 ug via INTRAVENOUS
  Administered 2015-03-18: 50 ug via INTRAVENOUS

## 2015-03-18 MED ORDER — LEVOTHYROXINE SODIUM 112 MCG PO TABS
112.0000 ug | ORAL_TABLET | Freq: Every day | ORAL | Status: DC
  Administered 2015-03-19 – 2015-03-23 (×5): 112 ug via ORAL
  Filled 2015-03-18 (×8): qty 1

## 2015-03-18 MED ORDER — HYDROCODONE-ACETAMINOPHEN 5-325 MG PO TABS: 1.0000 | ORAL_TABLET | Freq: Four times a day (QID) | ORAL | Status: DC | PRN

## 2015-03-18 MED ORDER — NEOSTIGMINE METHYLSULFATE 10 MG/10ML IV SOLN
INTRAVENOUS | Status: DC | PRN
  Administered 2015-03-18: 5 mg via INTRAVENOUS

## 2015-03-18 MED ORDER — ACETAMINOPHEN 500 MG PO TABS
500.0000 mg | ORAL_TABLET | Freq: Three times a day (TID) | ORAL | Status: DC
  Administered 2015-03-18: 500 mg via ORAL
  Filled 2015-03-18: qty 1

## 2015-03-18 MED ORDER — PROPOFOL 10 MG/ML IV BOLUS
INTRAVENOUS | Status: AC
  Filled 2015-03-18: qty 20

## 2015-03-18 MED ORDER — CLINDAMYCIN PHOSPHATE 600 MG/50ML IV SOLN
600.0000 mg | Freq: Four times a day (QID) | INTRAVENOUS | Status: AC
  Administered 2015-03-18 – 2015-03-19 (×2): 600 mg via INTRAVENOUS
  Filled 2015-03-18 (×2): qty 50

## 2015-03-18 MED ORDER — ZOLPIDEM TARTRATE 10 MG PO TABS: 10.0000 mg | ORAL_TABLET | Freq: Every evening | ORAL | Status: DC | PRN

## 2015-03-18 MED ORDER — EPHEDRINE SULFATE 50 MG/ML IJ SOLN
INTRAMUSCULAR | Status: DC | PRN
  Administered 2015-03-18 (×3): 10 mg via INTRAVENOUS

## 2015-03-18 MED ORDER — SODIUM CHLORIDE 0.9 % IV SOLN: INTRAVENOUS | Status: DC

## 2015-03-18 MED ORDER — HYDROMORPHONE HCL 1 MG/ML IJ SOLN
INTRAMUSCULAR | Status: AC
  Filled 2015-03-18: qty 1

## 2015-03-18 MED ORDER — MORPHINE SULFATE (PF) 4 MG/ML IV SOLN: 4.0000 mg | Freq: Once | INTRAVENOUS | Status: DC

## 2015-03-18 MED ORDER — ONDANSETRON HCL 4 MG/2ML IJ SOLN
INTRAMUSCULAR | Status: AC
  Filled 2015-03-18: qty 2

## 2015-03-18 MED ORDER — ZOLPIDEM TARTRATE 5 MG PO TABS: 5.0000 mg | ORAL_TABLET | Freq: Every evening | ORAL | Status: DC | PRN

## 2015-03-18 MED ORDER — METOCLOPRAMIDE HCL 5 MG/ML IJ SOLN
5.0000 mg | Freq: Three times a day (TID) | INTRAMUSCULAR | Status: DC | PRN
  Administered 2015-03-21: 10 mg via INTRAVENOUS
  Filled 2015-03-18: qty 2

## 2015-03-18 MED ORDER — PROPOFOL 10 MG/ML IV BOLUS
INTRAVENOUS | Status: DC | PRN
  Administered 2015-03-18: 100 mg via INTRAVENOUS

## 2015-03-18 MED ORDER — PHENOL 1.4 % MT LIQD
1.0000 | OROMUCOSAL | Status: DC | PRN
  Filled 2015-03-18: qty 177

## 2015-03-18 MED ORDER — ROCURONIUM BROMIDE 100 MG/10ML IV SOLN
INTRAVENOUS | Status: DC | PRN
  Administered 2015-03-18: 30 mg via INTRAVENOUS

## 2015-03-18 MED ORDER — ALCOHOL (RUBBING) 70 % SOLN
Status: DC | PRN
  Administered 2015-03-18: 20 mL via TOPICAL

## 2015-03-18 MED ORDER — SODIUM CHLORIDE 0.9 % IV BOLUS (SEPSIS)
1000.0000 mL | Freq: Once | INTRAVENOUS | Status: AC
  Administered 2015-03-18: 1000 mL via INTRAVENOUS

## 2015-03-18 MED ORDER — ONDANSETRON HCL 4 MG/2ML IJ SOLN
4.0000 mg | Freq: Four times a day (QID) | INTRAMUSCULAR | Status: DC | PRN
  Administered 2015-03-20 – 2015-03-22 (×3): 4 mg via INTRAVENOUS
  Filled 2015-03-18 (×4): qty 2

## 2015-03-18 MED ORDER — MENTHOL 3 MG MT LOZG: 1.0000 | LOZENGE | OROMUCOSAL | Status: DC | PRN

## 2015-03-18 MED ORDER — HYDROMORPHONE HCL 1 MG/ML IJ SOLN: 0.5000 mg | INTRAMUSCULAR | Status: DC | PRN

## 2015-03-18 MED ORDER — METOCLOPRAMIDE HCL 10 MG PO TABS: 5.0000 mg | ORAL_TABLET | Freq: Three times a day (TID) | ORAL | Status: DC | PRN

## 2015-03-18 MED ORDER — CLINDAMYCIN PHOSPHATE 900 MG/50ML IV SOLN
INTRAVENOUS | Status: AC
  Filled 2015-03-18: qty 50

## 2015-03-18 MED ORDER — LIDOCAINE HCL (CARDIAC) 20 MG/ML IV SOLN
INTRAVENOUS | Status: AC
  Filled 2015-03-18: qty 5

## 2015-03-18 MED ORDER — ONDANSETRON HCL 4 MG/2ML IJ SOLN
4.0000 mg | Freq: Once | INTRAMUSCULAR | Status: AC
  Administered 2015-03-18: 4 mg via INTRAVENOUS
  Filled 2015-03-18: qty 2

## 2015-03-18 MED ORDER — DEXAMETHASONE SODIUM PHOSPHATE 10 MG/ML IJ SOLN
INTRAMUSCULAR | Status: DC | PRN
  Administered 2015-03-18: 10 mg via INTRAVENOUS

## 2015-03-18 MED ORDER — ONDANSETRON HCL 4 MG PO TABS: 4.0000 mg | ORAL_TABLET | Freq: Four times a day (QID) | ORAL | Status: DC | PRN

## 2015-03-18 MED ORDER — LACTATED RINGERS IV SOLN
INTRAVENOUS | Status: DC
  Administered 2015-03-18: 1000 mL via INTRAVENOUS

## 2015-03-18 MED ORDER — HYDROCODONE-ACETAMINOPHEN 5-325 MG PO TABS
1.0000 | ORAL_TABLET | Freq: Four times a day (QID) | ORAL | Status: DC | PRN
  Administered 2015-03-19 (×5): 1 via ORAL
  Administered 2015-03-20: 2 via ORAL
  Administered 2015-03-20 – 2015-03-21 (×3): 1 via ORAL
  Administered 2015-03-21: 2 via ORAL
  Administered 2015-03-21 (×2): 1 via ORAL
  Administered 2015-03-22 – 2015-03-23 (×6): 2 via ORAL
  Filled 2015-03-18: qty 1
  Filled 2015-03-18: qty 2
  Filled 2015-03-18: qty 1
  Filled 2015-03-18: qty 2
  Filled 2015-03-18 (×3): qty 1
  Filled 2015-03-18: qty 2
  Filled 2015-03-18 (×3): qty 1
  Filled 2015-03-18: qty 2
  Filled 2015-03-18: qty 1
  Filled 2015-03-18 (×3): qty 2
  Filled 2015-03-18: qty 1
  Filled 2015-03-18: qty 2
  Filled 2015-03-18: qty 1
  Filled 2015-03-18: qty 2

## 2015-03-18 MED ORDER — ONDANSETRON HCL 4 MG/2ML IJ SOLN
INTRAMUSCULAR | Status: DC | PRN
  Administered 2015-03-18: 4 mg via INTRAVENOUS

## 2015-03-18 MED ORDER — DEXAMETHASONE SODIUM PHOSPHATE 10 MG/ML IJ SOLN
INTRAMUSCULAR | Status: AC
  Filled 2015-03-18: qty 1

## 2015-03-18 MED ORDER — IRBESARTAN 300 MG PO TABS
300.0000 mg | ORAL_TABLET | Freq: Every day | ORAL | Status: DC
  Filled 2015-03-18 (×2): qty 1

## 2015-03-18 MED ORDER — ASPIRIN EC 325 MG PO TBEC: 325.0000 mg | DELAYED_RELEASE_TABLET | Freq: Every day | ORAL | Status: DC

## 2015-03-18 MED ORDER — ATENOLOL 25 MG PO TABS
25.0000 mg | ORAL_TABLET | Freq: Every day | ORAL | Status: DC
  Administered 2015-03-18: 25 mg via ORAL
  Filled 2015-03-18 (×3): qty 1

## 2015-03-18 SURGICAL SUPPLY — 36 items
BAG ZIPLOCK 12X15 (MISCELLANEOUS) IMPLANT
BIT DRILL FLUTED FEMUR 4.2/3 (BIT) ×3 IMPLANT
BLADE TFNA HELICAL 100 STRL (Anchor) ×3 IMPLANT
CHLORAPREP W/TINT 26ML (MISCELLANEOUS) ×3 IMPLANT
COVER PERINEAL POST (MISCELLANEOUS) ×3 IMPLANT
DRAPE C-ARM 42X120 X-RAY (DRAPES) ×3 IMPLANT
DRAPE C-ARMOR (DRAPES) ×3 IMPLANT
DRAPE ORTHO SPLIT 77X108 STRL (DRAPES)
DRAPE STERI IOBAN 125X83 (DRAPES) ×3 IMPLANT
DRAPE SURG ORHT 6 SPLT 77X108 (DRAPES) IMPLANT
DRAPE U-SHAPE 47X51 STRL (DRAPES) ×6 IMPLANT
DRSG MEPILEX BORDER 4X4 (GAUZE/BANDAGES/DRESSINGS) ×3 IMPLANT
DRSG MEPILEX BORDER 4X8 (GAUZE/BANDAGES/DRESSINGS) ×3 IMPLANT
ELECT BLADE TIP CTD 4 INCH (ELECTRODE) ×3 IMPLANT
GAUZE SPONGE 4X4 12PLY STRL (GAUZE/BANDAGES/DRESSINGS) ×3 IMPLANT
GLOVE BIOGEL PI IND STRL 8.5 (GLOVE) ×1 IMPLANT
GLOVE BIOGEL PI INDICATOR 8.5 (GLOVE) ×2
GLOVE SURG ORTHO 8.5 STRL (GLOVE) ×9 IMPLANT
GOWN SPEC L3 XXLG W/TWL (GOWN DISPOSABLE) ×6 IMPLANT
GUIDEWIRE 3.2X400 (WIRE) ×6 IMPLANT
IMPL DEG TI CANN 11MM/130 (Orthopedic Implant) ×1 IMPLANT
IMPLANT DEG TI CANN 11MM/130 (Orthopedic Implant) ×3 IMPLANT
KIT BASIN OR (CUSTOM PROCEDURE TRAY) ×3 IMPLANT
LIQUID BAND (GAUZE/BANDAGES/DRESSINGS) ×3 IMPLANT
MANIFOLD NEPTUNE II (INSTRUMENTS) ×3 IMPLANT
PACK TOTAL JOINT (CUSTOM PROCEDURE TRAY) ×3 IMPLANT
PEN SKIN MARKING BROAD (MISCELLANEOUS) ×3 IMPLANT
SCREW LOCK T25 FT 36X5X4.3X (Screw) ×1 IMPLANT
SCREW LOCKING 5.0X36MM (Screw) ×2 IMPLANT
SUT MNCRL AB 3-0 PS2 18 (SUTURE) ×3 IMPLANT
SUT MON AB 2-0 CT1 27 (SUTURE) ×3 IMPLANT
SUT VIC AB 1 CT1 27 (SUTURE) ×2
SUT VIC AB 1 CT1 27XBRD ANTBC (SUTURE) ×1 IMPLANT
SUT VIC AB 2-0 CT1 27 (SUTURE)
SUT VIC AB 2-0 CT1 27XBRD (SUTURE) IMPLANT
YANKAUER SUCT BULB TIP NO VENT (SUCTIONS) ×3 IMPLANT

## 2015-03-18 NOTE — ED Notes (Signed)
Pt arrives to the ER via EMS s/p fall and complaining of rt hip pain; pt states that she was going to the bathroom and tripped; pt c/o rt hip pain; pt with shortening of the rt leg and inward rotation of the foot; + pedal pulse and normal sensation; EMS administered 244mcg of Fentanyl en route to the ER; pt with sheet tied around pelvis for comfort

## 2015-03-18 NOTE — Brief Op Note (Signed)
03/18/2015  6:49 PM  PATIENT:  Bailey [name]  81 y.o. female  PRE-OPERATIVE DIAGNOSIS:  right hip fracture  POST-OPERATIVE DIAGNOSIS:  right hip fracture  PROCEDURE:  Procedure(s): INTRAMEDULLARY (IM) NAIL FEMORAL (Right)  SURGEON:  Surgeon(s) and Role:    * Rod Can, MD - Primary  PHYSICIAN ASSISTANT: none  ASSISTANTS: none   ANESTHESIA:   general  EBL:  Total I/O In: -  Out: 1100 [Urine:1000; Blood:100]  BLOOD ADMINISTERED:none  DRAINS: none   LOCAL MEDICATIONS USED:  NONE  SPECIMEN:  No Specimen  DISPOSITION OF SPECIMEN:  N/A  COUNTS:  YES  TOURNIQUET:  * No tourniquets in log *  DICTATION: .Other Dictation: Dictation Number [id number]  PLAN OF CARE: Admit to inpatient   PATIENT DISPOSITION:  PACU - hemodynamically stable.   Delay start of Pharmacological VTE agent (>24hrs) due to surgical blood loss or risk of bleeding: no

## 2015-03-18 NOTE — Interval H&P Note (Signed)
History and Physical Interval Note:  03/18/2015 5:09 PM  Bailey [name]  has presented today for surgery, with the diagnosis of right hip fracture  The various methods of treatment have been discussed with the patient and family. After consideration of risks, benefits and other options for treatment, the patient has consented to  Procedure(s): INTRAMEDULLARY (IM) NAIL FEMORAL (Right) as a surgical intervention .  The patient's history has been reviewed, patient examined, no change in status, stable for surgery.  I have reviewed the patient's chart and labs.  Questions were answered to the patient's satisfaction.     Risha Barretta, Horald Pollen

## 2015-03-18 NOTE — ED Notes (Signed)
Bed: HK06 Expected date:  Expected time:  Means of arrival:  Comments: EMS 81 yo female from SNF, fall, external rotation right hip

## 2015-03-18 NOTE — Consult Note (Signed)
Reason for Consult: Broken Hip Referring Physician: EDP  Bailey [name] is an 81 y.o. female.  HPI: 81 yo female s/p mechanical fall early this morning with right hip pain.  Patient reports sliding down to the floor and having the immediate onset of severe right groin pain.  Patient unable to stand up after the incident. She is a resident of [facility].  Past Medical History  Diagnosis Date  . Osteoarthrosis, unspecified whether generalized or localized, forearm     bilaterally wrist  . Allergic rhinitis, cause unspecified   . Senile osteoporosis   . Malignant neoplasm of breast (female), unspecified site 1990    left. Had surgerey and chemo, but no radiation  . Acquired cyst of kidney     left  . 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 Anemia, unspecified   . Unspecified gastritis and gastroduodenitis without mention of hemorrhage   . 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 Other and unspecified nonspecific immunological findings     positive ANA  . History of Epstein-Barr virus infection   . Herpes simplex without mention of complication   . Insomnia, unspecified   . Unspecified hypothyroidism   . Hyposmolality and/or hyponatremia   . Unspecified vitamin D deficiency   . Pure hypercholesterolemia   . Other B-complex deficiencies   . Osteoarthrosis of knee     bilaterally  . Closed fracture of unspecified part of vertebral column without mention of spinal cord injury     T7 s/p kyphoplasty, T12  . Unspecified essential hypertension   . Pain in joint, site unspecified     severe, diffuse, chronic pain  . Osteoarthrosis, hip   . Fracture of left hip (HCC) 06/13/2007  . Edema 2015  . 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 Neuropathy (HCC)   . Positive ANA (antinuclear antibody)   . Insomnia     Past Surgical History  Procedure Laterality Date  .  Dilation and curettage of uterus      x2  . Middle ear surgery Bilateral 1938  . Mastectomy Left 04/29/1989  . Tonsillectomy  1968  . Nasal sinus surgery  1990 & 2000  . Cholecystectomy, laparoscopic  09/20/2006  . Orif hip fracture Left 06/13/2007    following a syncopal episode  . Kyphoplasty  07/03/2010    T7 and T12  . Kyphoplasty      No family history on file.  Social History:  reports that she has quit smoking. She has never used smokeless tobacco. She reports that she does not drink alcohol or use illicit drugs.  Allergies:  Allergies  Allergen Reactions  . Aspirin     Drop in body temp with large quantities, can tolerate low doses of aspirin  . Penicillins Swelling    Medications: I have reviewed the patient's current medications.  Results for orders placed or performed during the hospital encounter of 03/18/15 (from the past 48 hour(s))  CBC with Differential/Platelet     Status: Abnormal   Collection Time: 03/18/15  2:37 AM  Result Value Ref Range   WBC 9.9 4.0 - 10.5 K/uL   RBC 3.67 (L) 3.87 - 5.11 MIL/uL   Hemoglobin 11.8 (L) 12.0 - 15.0 g/dL   HCT 34.1 (L) 36.0 - 46.0 %   MCV 92.9 78.0 - 100.0 fL   MCH 32.2   26.0 - 34.0 pg   MCHC 34.6 30.0 - 36.0 g/dL   RDW 12.1 11.5 - 15.5 %   Platelets 165 150 - 400 K/uL   Neutrophils Relative % 82 %   Neutro Abs 8.0 (H) 1.7 - 7.7 K/uL   Lymphocytes Relative 12 %   Lymphs Abs 1.2 0.7 - 4.0 K/uL   Monocytes Relative 5 %   Monocytes Absolute 0.5 0.1 - 1.0 K/uL   Eosinophils Relative 1 %   Eosinophils Absolute 0.1 0.0 - 0.7 K/uL   Basophils Relative 0 %   Basophils Absolute 0.0 0.0 - 0.1 K/uL  Basic metabolic panel     Status: Abnormal   Collection Time: 03/18/15  2:37 AM  Result Value Ref Range   Sodium 133 (L) 135 - 145 mmol/L   Potassium 3.8 3.5 - 5.1 mmol/L   Chloride 100 (L) 101 - 111 mmol/L   CO2 25 22 - 32 mmol/L   Glucose, Bld 133 (H) 65 - 99 mg/dL   BUN 33 (H) 6 - 20 mg/dL   Creatinine, Ser 1.20 (H) 0.44 -  1.00 mg/dL   Calcium 8.6 (L) 8.9 - 10.3 mg/dL   GFR calc non Af Amer 40 (L) >60 mL/min   GFR calc Af Amer 46 (L) >60 mL/min    Comment: (NOTE) The eGFR has been calculated using the CKD EPI equation. This calculation has not been validated in all clinical situations. eGFR's persistently <60 mL/min signify possible Chronic Kidney Disease.    Anion gap 8 5 - 15    Dg Hip Unilat  With Pelvis 2-3 Views Right  03/18/2015  CLINICAL DATA:  Patient fell wall walking to the bathroom. Obvious deformity to the right hip. EXAM: DG HIP (WITH OR WITHOUT PELVIS) 2-3V RIGHT COMPARISON:  None. FINDINGS: Acute comminuted inter trochanteric fracture of the right hip with varus angulation of the fracture fragments and mild impaction. Acute fracture of the innominate bone with displacement. Acute fracture of the right inferior pubic ramus. No dislocation of the hip joint. No focal bone lesions identified. Vascular calcifications. IMPRESSION: Acute comminuted inter trochanteric fracture of the right hip with varus angulation and mild impaction. Fractures of the right inferior pubic ramus and innominate bone. Electronically Signed   By: Lucienne Capers M.D.   On: 03/18/2015 02:27    ROS Blood pressure 109/56, pulse 59, temperature 97.7 F (36.5 C), temperature source Oral, resp. rate 10, SpO2 93 %. Physical Exam    AAO, neck non tender, normal ROM   Chest non tender, normal excursion, abdomen soft, scaphoid Bilateral UEs with normal ROM and no pain with AROM, 5/5 motor and sensation intact Right LE shortened and externally rotated, NVI, Left LE with pain free AROM, NVI  Assessment/Plan: Right displaced intertrochanteric femur fracture and ipsilateral pubic ramus and possible acetabular fracture CT right hip.  Keep NPO Patient will require surgical management of this fracture.  Will review with hip specialist in the morning and plan to follow.  Belal Scallon,STEVEN R 03/18/2015, 4:06 AM

## 2015-03-18 NOTE — Anesthesia Postprocedure Evaluation (Signed)
  Anesthesia Post-op Note  Patient: Bailey [name]  Procedure(s) Performed: Procedure(s) (LRB): INTRAMEDULLARY (IM) NAIL FEMORAL (Right)  Patient Location: PACU  Anesthesia Type: General  Level of Consciousness: awake and alert   Airway and Oxygen Therapy: Patient Spontanous Breathing  Post-op Pain: mild  Post-op Assessment: Post-op Vital signs reviewed, Patient's Cardiovascular Status Stable, Respiratory Function Stable, Patent Airway and No signs of Nausea or vomiting  Last Vitals:  Filed Vitals:   03/18/15 1400  BP: 135/67  Pulse: 62  Temp: 36.8 C  Resp: 16    Post-op Vital Signs: stable   Complications: No apparent anesthesia complications

## 2015-03-18 NOTE — Transfer of Care (Signed)
Immediate Anesthesia Transfer of Care Note  Patient: Bailey [name]  Procedure(s) Performed: Procedure(s): INTRAMEDULLARY (IM) NAIL FEMORAL (Right)  Patient Location: PACU  Anesthesia Type:General  Level of Consciousness:  sedated, patient cooperative and responds to stimulation  Airway & Oxygen Therapy:Patient Spontanous Breathing and Patient connected to face mask oxgen  Post-op Assessment:  Report given to PACU RN and Post -op Vital signs reviewed and stable  Post vital signs:  Reviewed and stable  Last Vitals:  Filed Vitals:   03/18/15 1400  BP: 135/67  Pulse: 62  Temp: 36.8 C  Resp: 16    Complications: No apparent anesthesia complications

## 2015-03-18 NOTE — Progress Notes (Signed)
Initial Nutrition Assessment  DOCUMENTATION CODES:   Not applicable  INTERVENTION:  - Please update chart anthropometrics - need updated height and weight to complete assessment. - RD team will continue to monitor for diet advancement and supplement needs.    NUTRITION DIAGNOSIS:   Inadequate oral intake related to inability to eat as evidenced by NPO status, other (see comment) (pending surgery).   GOAL:   Patient will meet greater than or equal to 90% of their needs   MONITOR:   Diet advancement, Weight trends, Labs (diet advacement per md)  REASON FOR ASSESSMENT:   Consult Hip fracture protocol  ASSESSMENT:   81 y.o. female who suffered a mechanical fall this evening / morning. She was getting out of bed to use the bathroom, when she tripped and landed on R hip. Immediate pain and inability to ambulate as well as deformity of the R leg. Patient brought in to ED.  Patient lying in bed with family in room during initial visit for hip fracture protocol consult. Patient provided her usual weight and height. No current anthropometric data available in chart.   Per nurse, patient has surgery pending today or tomorrow.   RD team will continue to follow for needs, advancement and assessment completion.   Labs reviewed: low heme (11.8), low Na (133), high BUN (33), high Cr (1.20), low Ca (8.6)  Medications reviewed.   Diet Order:  Diet NPO time specified Except for: Sips with Meds  Skin:  Reviewed, no issues  Last BM:  unknown  Height:   Ht Readings from Last 1 Encounters:  03/18/15 5\' 8"  (1.727 m)    Weight:   Wt Readings from Last 1 Encounters:  11/18/14 130 lb (58.968 kg)    Ideal Body Weight:  54.5 kg  BMI:  There is no weight on file to calculate BMI.  Estimated Nutritional Needs:   Kcal:     Protein:     Fluid:     EDUCATION NEEDS:   No education needs identified at this time  Kayleen Memos, Dietetic Intern 03/18/2015 1:31 PM

## 2015-03-18 NOTE — Progress Notes (Signed)
I have seen and assessed patient and agree with Dr Juleen China assessment and plan. Patient is a 81 year old female presented with a mechanical fall and sustaining a right intertrochanteric femur fracture with a right superior/inferior rami fracture with extension into the anterior wall acetabulum. Patient currently under bed rest. CT pelvis to evaluate SI joints and sacrum are been ordered as well as right femur films per orthopedics. Patient for possible surgical repair today versus tomorrow per orthopedics. Patient currently nothing by mouth. Continue current home regimen of antihypertensives, Synthroid.

## 2015-03-18 NOTE — Discharge Instructions (Signed)
 Dr. Haroon Shatto Adult Hip & Knee Specialist [facility] [address] [place], [place] [address] [phone]   POSTOPERATIVE DIRECTIONS    Hip Rehabilitation, Guidelines Following Surgery   WEIGHT BEARING Weight bearing as tolerated with assist device (walker, cane, etc) as directed, use it as long as suggested by your surgeon or therapist, typically at least 4-6 weeks.   HOME CARE INSTRUCTIONS  Remove items at home which could result in a fall. This includes throw rugs or furniture in walking pathways.  Continue medications as instructed at time of discharge.  You may have some home medications which will be placed on hold until you complete the course of blood thinner medication.  4 days after discharge, you may start showering. No tub baths or soaking your incisions. Do not put on socks or shoes without following the instructions of your caregivers.   Sit on chairs with arms. Use the chair arms to help push yourself up when arising.  Arrange for the use of a toilet seat elevator so you are not sitting low.   Walk with walker as instructed.  You may resume a sexual relationship in one month or when given the OK by your caregiver.  Use walker as long as suggested by your caregivers.  Avoid periods of inactivity such as sitting longer than an hour when not asleep. This helps prevent blood clots.  You may return to work once you are cleared by your surgeon.  Do not drive a car for 6 weeks or until released by your surgeon.  Do not drive while taking narcotics.  Wear elastic stockings for two weeks following surgery during the day but you may remove then at night.  Make sure you keep all of your appointments after your operation with all of your doctors and caregivers. You should call the office at the above phone number and make an appointment for approximately two weeks after the date of your surgery. Please pick up a stool softener and laxative  for home use as long as you are requiring pain medications.  ICE to the affected hip every three hours for 30 minutes at a time and then as needed for pain and swelling. Continue to use ice on the hip for pain and swelling from surgery. You may notice swelling that will progress down to the foot and ankle.  This is normal after surgery.  Elevate the leg when you are not up walking on it.   It is important for you to complete the blood thinner medication as prescribed by your doctor.  Continue to use the breathing machine which will help keep your temperature down.  It is common for your temperature to cycle up and down following surgery, especially at night when you are not up moving around and exerting yourself.  The breathing machine keeps your lungs expanded and your temperature down.  RANGE OF MOTION AND STRENGTHENING EXERCISES  These exercises are designed to help you keep full movement of your hip joint. Follow your caregiver's or physical therapist's instructions. Perform all exercises about fifteen times, three times per day or as directed. Exercise both hips, even if you have had only one joint replacement. These exercises can be done on a training (exercise) mat, on the floor, on a table or on a bed. Use whatever works the best and is most comfortable for you. Use music or television while you are exercising so that the exercises are a pleasant break in your day. This   will make your life better with the exercises acting as a break in routine you can look forward to.  Lying on your back, slowly slide your foot toward your buttocks, raising your knee up off the floor. Then slowly slide your foot back down until your leg is straight again.  Lying on your back spread your legs as far apart as you can without causing discomfort.  Lying on your side, raise your upper leg and foot straight up from the floor as far as is comfortable. Slowly lower the leg and repeat.  Lying on your back, tighten up the  muscle in the front of your thigh (quadriceps muscles). You can do this by keeping your leg straight and trying to raise your heel off the floor. This helps strengthen the largest muscle supporting your knee.  Lying on your back, tighten up the muscles of your buttocks both with the legs straight and with the knee bent at a comfortable angle while keeping your heel on the floor.   SKILLED REHAB INSTRUCTIONS: If the patient is transferred to a skilled rehab facility following release from the hospital, a list of the current medications will be sent to the facility for the patient to continue.  When discharged from the skilled rehab facility, please have the facility set up the patient's Home Health Physical Therapy prior to being released. Also, the skilled facility will be responsible for providing the patient with their medications at time of release from the facility to include their pain medication and their blood thinner medication. If the patient is still at the rehab facility at time of the two week follow up appointment, the skilled rehab facility will also need to assist the patient in arranging follow up appointment in our office and any transportation needs.  MAKE SURE YOU:  Understand these instructions.  Will watch your condition.  Will get help right away if you are not doing well or get worse.  Pick up stool softner and laxative for home use following surgery while on pain medications. Daily dry dressing changes as needed. In 4 days, you may remove your dressings and begin taking showers - no tub baths or soaking the incisions. Continue to use ice for pain and swelling after surgery. Do not use any lotions or creams on the incision until instructed by your surgeon.   

## 2015-03-18 NOTE — H&P (Signed)
[facility] History and Physical  Bailey [name] QMV:[id number] DOB: August 21, 1926 DOA: 03/18/2015  Referring physician: EDP PCP: Estill Dooms, MD   Chief Complaint: Hip pain   HPI: Bailey [name] is a 81 y.o. female who suffered a mechanical fall this evening / morning.  She was getting out of bed to use the bathroom, when she tripped and landed on R hip.  Immediate pain and inability to ambulate as well as deformity of the R leg.  Patient brought in to ED.  Review of Systems: Systems reviewed.  As above, otherwise negative  Past Medical History  Diagnosis Date  . Osteoarthrosis, unspecified whether generalized or localized, forearm     bilaterally wrist  . Allergic rhinitis, cause unspecified   . Senile osteoporosis   . Malignant neoplasm of breast (female), unspecified site 1990    left. Had surgerey and chemo, but no radiation  . Acquired cyst of kidney     left  . 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 Anemia, unspecified   . Unspecified gastritis and gastroduodenitis without mention of hemorrhage   . 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 Other and unspecified nonspecific immunological findings     positive ANA  . History of Epstein-Barr virus infection   . Herpes simplex without mention of complication   . Insomnia, unspecified   . Unspecified hypothyroidism   . Hyposmolality and/or hyponatremia   . Unspecified vitamin D deficiency   . Pure hypercholesterolemia   . Other B-complex deficiencies   . Osteoarthrosis of knee     bilaterally  . Closed fracture of unspecified part of vertebral column without mention of spinal cord injury     T7 s/p kyphoplasty, T12  . Unspecified essential hypertension   . Pain in joint, site unspecified     severe, diffuse, chronic pain  . Osteoarthrosis, hip   . Fracture of left hip ([place]) 06/13/2007  . Edema 2015  . CKD stage 2 due to  type 2 diabetes mellitus ([place]) 11/18/2014  . Neuropathy ([place])   . Positive ANA (antinuclear antibody)   . Insomnia    Past Surgical History  Procedure Laterality Date  . Dilation and curettage of uterus      x2  . Middle ear surgery Bilateral 1938  . Mastectomy Left 04/29/1989  . Tonsillectomy  1968  . Nasal sinus surgery  1990 & 2000  . Cholecystectomy, laparoscopic  09/20/2006  . Orif hip fracture Left 06/13/2007    following a syncopal episode  . Kyphoplasty  07/03/2010    T7 and T12  . Kyphoplasty     Social History:  reports that she has quit smoking. She has never used smokeless tobacco. She reports that she does not drink alcohol or use illicit drugs.   Allergies  Allergen Reactions  . Aspirin     Drop in body temp with large quantities, can tolerate low doses of aspirin  . Penicillins Swelling    No family history on file.   Prior to Admission medications   Medication Sig Start Date End Date Taking? Authorizing Provider  amLODipine (NORVASC) 5 MG tablet TAKE 1 TABLET BY MOUTH TWICE DAILY TO CONTROL BLOOD PRESSURE 01/15/15  Yes Estill Dooms, MD  aspirin 81 MG tablet Take 81 mg by mouth daily.   Yes Historical Provider, MD  atenolol (TENORMIN) 25 MG tablet Take one tablet  by mouth once daily for blood pressure 02/12/15  Yes Lauree Chandler, NP  cholecalciferol (VITAMIN D) 1000 UNITS tablet Take 3,000 Units by mouth daily.   Yes Historical Provider, MD  Cyanocobalamin (VITAMIN B-12 CR PO) Take 1 tablet by mouth daily.    Yes Historical Provider, MD  denosumab (PROLIA) 60 MG/ML SOLN injection Inject 60 mg into the skin every 6 (six) months. Administer in upper arm, thigh, or abdomen   Yes Historical Provider, MD  diclofenac (VOLTAREN) 50 MG EC tablet Take one tablet by mouth twice daily to help arthritis pain 06/04/14  Yes Gildardo Cranker, DO  irbesartan (AVAPRO) 300 MG tablet TAKE ONE TABLET BY MOUTH ONCE DAILY FOR BLOOD PRESSURE 12/11/14  Yes Estill Dooms, MD  levothyroxine  (SYNTHROID, LEVOTHROID) 112 MCG tablet Take one tablet by mouth once daily for thyroid supplement 03/26/14  Yes Estill Dooms, MD  lidocaine (LIDODERM) 5 % Place 1-3 patches onto the skin daily. Apply in AM and remove in PM. Remove & Discard patch within 12 hours or as directed by MD   Yes Historical Provider, MD  oxyCODONE-acetaminophen (PERCOCET/ROXICET) 5-325 MG tablet Take one tablet every 4 hours as needed to control pain 02/17/15  Yes Estill Dooms, MD  zolpidem (AMBIEN) 10 MG tablet Take one tablet by mouth by mouth at bedtime for sleep 03/17/15  Yes Estill Dooms, MD   Physical Exam: Filed Vitals:   03/18/15 0400  BP: 127/62  Pulse: 60  Temp:   Resp: 13    BP 127/62 mmHg  Pulse 60  Temp(Src) 97.7 F (36.5 C) (Oral)  Resp 13  SpO2 95%  General Appearance:    Alert, oriented, no distress, appears stated age  Head:    Normocephalic, atraumatic  Eyes:    PERRL, EOMI, sclera non-icteric        Nose:   Nares without drainage or epistaxis. Mucosa, turbinates normal  Throat:   Moist mucous membranes. Oropharynx without erythema or exudate.  Neck:   Supple. No carotid bruits.  No thyromegaly.  No lymphadenopathy.   Back:     No CVA tenderness, no spinal tenderness  Lungs:     Clear to auscultation bilaterally, without wheezes, rhonchi or rales  Chest wall:    No tenderness to palpitation  Heart:    Regular rate and rhythm without murmurs, gallops, rubs  Abdomen:     Soft, non-tender, nondistended, normal bowel sounds, no organomegaly  Genitalia:    deferred  Rectal:    deferred  Extremities:   No clubbing, cyanosis or edema.  Pulses:   2+ and symmetric all extremities  Skin:   Skin color, texture, turgor normal, no rashes or lesions  Lymph nodes:   Cervical, supraclavicular, and axillary nodes normal  Neurologic:   CNII-XII intact. Normal strength, sensation and reflexes      throughout    Labs on Admission:  Basic Metabolic Panel:  Recent Labs Lab 03/18/15 0237  NA  133*  K 3.8  CL 100*  CO2 25  GLUCOSE 133*  BUN 33*  CREATININE 1.20*  CALCIUM 8.6*   Liver Function Tests: No results for input(s): AST, ALT, ALKPHOS, BILITOT, PROT, ALBUMIN in the last 168 hours. No results for input(s): LIPASE, AMYLASE in the last 168 hours. No results for input(s): AMMONIA in the last 168 hours. CBC:  Recent Labs Lab 03/18/15 0237  WBC 9.9  NEUTROABS 8.0*  HGB 11.8*  HCT 34.1*  MCV 92.9  PLT 165  Cardiac Enzymes: No results for input(s): CKTOTAL, CKMB, CKMBINDEX, TROPONINI in the last 168 hours.  BNP (last 3 results) No results for input(s): PROBNP in the last 8760 hours. CBG: No results for input(s): GLUCAP in the last 168 hours.  Radiological Exams on Admission: Dg Hip Unilat  With Pelvis 2-3 Views Right  03/18/2015  CLINICAL DATA:  Patient fell wall walking to the bathroom. Obvious deformity to the right hip. EXAM: DG HIP (WITH OR WITHOUT PELVIS) 2-3V RIGHT COMPARISON:  None. FINDINGS: Acute comminuted inter trochanteric fracture of the right hip with varus angulation of the fracture fragments and mild impaction. Acute fracture of the innominate bone with displacement. Acute fracture of the right inferior pubic ramus. No dislocation of the hip joint. No focal bone lesions identified. Vascular calcifications. IMPRESSION: Acute comminuted inter trochanteric fracture of the right hip with varus angulation and mild impaction. Fractures of the right inferior pubic ramus and innominate bone. Electronically Signed   By: Lucienne Capers M.D.   On: 03/18/2015 02:27    EKG: Independently reviewed.  Assessment/Plan Active Problems:   Closed right hip fracture (HCC)   1. Closed R hip fracture -  1. Hip fx pathway 2. CT scan of hip to ensure no acetabular involvement of the innominate bone fracture 3. NPO 4. Morphine 4mg  ordered now for pain ctrl, then ctrl per pathway. 2. HTN - continue home meds 3. Hypothyroidism - continue synthroid 4. DVT ppx - ASA  81 and SCDs (as instructed by Dr. Veverly Fells who is here in the ED evaluating patient).    Code Status: Full Code  Family Communication: Husband at bedside Disposition Plan: Admit to inpatient   Time spent: 70 min  GARDNER, JARED M. [facility] Pager [phone]  If 7AM-7PM, please contact the day team taking care of the patient [email or web address] Password [email or web address] 03/18/2015, 4:16 AM

## 2015-03-18 NOTE — Progress Notes (Signed)
This encounter was created in error - please disregard.

## 2015-03-18 NOTE — Anesthesia Preprocedure Evaluation (Addendum)
Anesthesia Evaluation  Patient identified by MRN, date of birth, ID band Patient awake  General Assessment Comment:Past Medical History Diagnosis Date . Osteoarthrosis, unspecified whether generalized or localized, forearm    bilaterally wrist . Allergic rhinitis, cause unspecified  . Senile osteoporosis  . Malignant neoplasm of breast (female), unspecified site 1990   left. Had surgerey and chemo, but no radiation . Acquired cyst of kidney    left . 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 Anemia, unspecified  . Unspecified gastritis and gastroduodenitis without mention of hemorrhage  . Mononeuritis of lower limb, unspecified    sensory motor neuropathy of both legs . Other and unspecified nonspecific immunological findings    positive ANA . History of Epstein-Barr virus infection  . Herpes simplex without mention of complication  . Insomnia, unspecified  . Unspecified hypothyroidism  . Hyposmolality and/or hyponatremia  . Unspecified vitamin D deficiency  . Pure hypercholesterolemia  . Other B-complex deficiencies  . Osteoarthrosis of knee    bilaterally . Closed fracture of unspecified part of vertebral column without mention of spinal cord injury    T7 s/p kyphoplasty, T12 . Unspecified essential hypertension  . Pain in joint, site unspecified    severe, diffuse, chronic pain . Osteoarthrosis, hip  . Fracture of left hip ([place]) 06/13/2007 . Edema 2015 . CKD stage 2 due to type 2 diabetes mellitus ([place]) 11/18/2014 . Neuropathy ([place])  . Positive ANA (antinuclear antibody)  . Insomnia       Reviewed: Allergy & Precautions, NPO status , Patient's Chart, lab work & pertinent test results  Airway Mallampati: II  TM Distance: >3 FB Neck ROM: Full    Dental no notable  dental hx.    Pulmonary former smoker,    Pulmonary exam normal breath sounds clear to auscultation       Cardiovascular hypertension, Pt. on medications and Pt. on home beta blockers + Peripheral Vascular Disease  Normal cardiovascular exam Rhythm:Regular Rate:Normal     Neuro/Psych  Neuromuscular disease negative psych ROS   GI/Hepatic negative GI ROS, Neg liver ROS,   Endo/Other  Hypothyroidism   Renal/GU Renal InsufficiencyRenal diseaseCKD stage 2  Cr 1.2 K 3.8  negative genitourinary   Musculoskeletal  (+) Arthritis ,   Abdominal   Peds negative pediatric ROS (+)  Hematology  (+) anemia , hgb 11.8   Anesthesia Other Findings   Reproductive/Obstetrics negative OB ROS                          Anesthesia Physical Anesthesia Plan  ASA: III  Anesthesia Plan: General   Post-op Pain Management:    Induction: Intravenous  Airway Management Planned: Oral ETT  Additional Equipment:   Intra-op Plan:   Post-operative Plan: Extubation in OR  Informed Consent: I have reviewed the patients History and Physical, chart, labs and discussed the procedure including the risks, benefits and alternatives for the proposed anesthesia with the patient or authorized representative who has indicated his/her understanding and acceptance.   Dental advisory given  Plan Discussed with: CRNA  Anesthesia Plan Comments: (Discussed spinal and general. She prefers general.)       Anesthesia Quick Evaluation

## 2015-03-18 NOTE — ED Notes (Signed)
Patient transported to CT 

## 2015-03-18 NOTE — H&P (View-Only) (Signed)
Reason for Consult: Broken Hip Referring Physician: EDP  Bailey [name] is an 81 y.o. female.  HPI: 81 yo female s/p mechanical fall early this morning with right hip pain.  Patient reports sliding down to the floor and having the immediate onset of severe right groin pain.  Patient unable to stand up after the incident. She is a resident of [facility].  Past Medical History  Diagnosis Date  . Osteoarthrosis, unspecified whether generalized or localized, forearm     bilaterally wrist  . Allergic rhinitis, cause unspecified   . Senile osteoporosis   . Malignant neoplasm of breast (female), unspecified site 1990    left. Had surgerey and chemo, but no radiation  . Acquired cyst of kidney     left  . 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 Anemia, unspecified   . Unspecified gastritis and gastroduodenitis without mention of hemorrhage   . 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 Other and unspecified nonspecific immunological findings     positive ANA  . History of Epstein-Barr virus infection   . Herpes simplex without mention of complication   . Insomnia, unspecified   . Unspecified hypothyroidism   . Hyposmolality and/or hyponatremia   . Unspecified vitamin D deficiency   . Pure hypercholesterolemia   . Other B-complex deficiencies   . Osteoarthrosis of knee     bilaterally  . Closed fracture of unspecified part of vertebral column without mention of spinal cord injury     T7 s/p kyphoplasty, T12  . Unspecified essential hypertension   . Pain in joint, site unspecified     severe, diffuse, chronic pain  . Osteoarthrosis, hip   . [place] ([place] 06[place]13[place]2007  . Edema 2015  . CKD stage 2 due to type 2 diabetes mellitus ([place] 11/18/2014  . [place]   . Positive ANA (antinuclear antibody)   . Insomnia     Past Surgical History  Procedure Laterality Date  .  Dilation and curettage of uterus      x2  . Middle ear surgery Bilateral 1938  . Mastectomy Left 04/29/1989  . Tonsillectomy  1968  . Nasal sinus surgery  1990 & 2000  . Cholecystectomy, laparoscopic  09/20/2006  . Orif hip fracture Left 06/13/2007    following a syncopal episode  . Kyphoplasty  07/03/2010    T7 and T12  . Kyphoplasty      No family history on file.  Social History:  reports that she has quit smoking. She has never used smokeless tobacco. She reports that she does not drink alcohol or use illicit drugs.  Allergies:  Allergies  Allergen Reactions  . Aspirin     Drop in body temp with large quantities, can tolerate low doses of aspirin  . Penicillins Swelling    Medications: I have reviewed the patient's current medications.  Results for orders placed or performed during the hospital encounter of 03/18/15 (from the past 48 hour(s))  CBC with Differential/Platelet     Status: Abnormal   Collection Time: 03/18/15  2:37 AM  Result Value Ref Range   WBC 9.9 4.0 - 10.5 K/uL   RBC 3.67 (L) 3.87 - 5.11 MIL/uL   Hemoglobin 11.8 (L) 12.0 - 15.0 g/dL   HCT 34.1 (L) 36.0 - 46.0 %   MCV 92.9 78.0 - 100.0 fL   MCH 32.2  26.0 - 34.0 pg   MCHC 34.6 30.0 - 36.0 g/dL   RDW 12.1 11.5 - 15.5 %   Platelets 165 150 - 400 K/uL   Neutrophils Relative % 82 %   Neutro Abs 8.0 (H) 1.7 - 7.7 K/uL   Lymphocytes Relative 12 %   Lymphs Abs 1.2 0.7 - 4.0 K/uL   Monocytes Relative 5 %   Monocytes Absolute 0.5 0.1 - 1.0 K/uL   Eosinophils Relative 1 %   Eosinophils Absolute 0.1 0.0 - 0.7 K/uL   Basophils Relative 0 %   Basophils Absolute 0.0 0.0 - 0.1 K/uL  Basic metabolic panel     Status: Abnormal   Collection Time: 03/18/15  2:37 AM  Result Value Ref Range   Sodium 133 (L) 135 - 145 mmol/L   Potassium 3.8 3.5 - 5.1 mmol/L   Chloride 100 (L) 101 - 111 mmol/L   CO2 25 22 - 32 mmol/L   Glucose, Bld 133 (H) 65 - 99 mg/dL   BUN 33 (H) 6 - 20 mg/dL   Creatinine, Ser 1.20 (H) 0.44 -  1.00 mg/dL   Calcium 8.6 (L) 8.9 - 10.3 mg/dL   GFR calc non Af Amer 40 (L) >60 mL/min   GFR calc Af Amer 46 (L) >60 mL/min    Comment: (NOTE) The eGFR has been calculated using the CKD EPI equation. This calculation has not been validated in all clinical situations. eGFR's persistently <60 mL/min signify possible Chronic Kidney Disease.    Anion gap 8 5 - 15    Dg Hip Unilat  With Pelvis 2-3 Views Right  03/18/2015  CLINICAL DATA:  Patient fell wall walking to the bathroom. Obvious deformity to the right hip. EXAM: DG HIP (WITH OR WITHOUT PELVIS) 2-3V RIGHT COMPARISON:  None. FINDINGS: Acute comminuted inter trochanteric fracture of the right hip with varus angulation of the fracture fragments and mild impaction. Acute fracture of the innominate bone with displacement. Acute fracture of the right inferior pubic ramus. No dislocation of the hip joint. No focal bone lesions identified. Vascular calcifications. IMPRESSION: Acute comminuted inter trochanteric fracture of the right hip with varus angulation and mild impaction. Fractures of the right inferior pubic ramus and innominate bone. Electronically Signed   By: Lucienne Capers M.D.   On: 03/18/2015 02:27    ROS Blood pressure 109/56, pulse 59, temperature 97.7 F (36.5 C), temperature source Oral, resp. rate 10, SpO2 93 %. Physical Exam    AAO, neck non tender, normal ROM   Chest non tender, normal excursion, abdomen soft, scaphoid Bilateral UEs with normal ROM and no pain with AROM, 5/5 motor and sensation intact Right LE shortened and externally rotated, NVI, Left LE with pain free AROM, NVI  Assessment/Plan: Right displaced intertrochanteric femur fracture and ipsilateral pubic ramus and possible acetabular fracture CT right hip.  Keep NPO Patient will require surgical management of this fracture.  Will review with hip specialist in the morning and plan to follow.  Bailey [name],Bailey [name] 03/18/2015, 4:06 AM

## 2015-03-18 NOTE — Progress Notes (Addendum)
Asked to assume care by Dr. Jolyn Nap GLF early this am. C/o R hip pain.  NAD RLE: shortened and externally rotated. Pain with logroll. NVI.  A/P: 1. R IT femur fx 2. R superior / inferior rami fx with extension into anterior wall acetabulum  -bedrest for now -needs CT pelvis to eval SI joints / sacrum -need R femur films -recommend IM nail fixation of R IT femur fx, today vs tomorrow -keep NPO for now

## 2015-03-18 NOTE — ED Provider Notes (Signed)
CSN: [id number]     Arrival date & time 03/18/15  0134 History   First MD Initiated Contact with Patient 03/18/15 0138     Chief Complaint  Patient presents with  . Hip Pain     (Consider location/radiation/quality/duration/timing/severity/associated sxs/prior Treatment) HPI Comments: Patient is an 81 year old female who presents with right hip pain that started prior to arrival after a fall. Patient reports getting out of bed to use the bathroom when she tripped and landed on her right hip. She denies any head trauma or LOC. Her husband was laying in bed and came to assist her immediately after the fall. The pain is aching and severe with out radiation. Movement and palpation make the pain worse. No alleviating factors. No other injury.   Patient is a 81 y.o. female presenting with hip pain.  Hip Pain Associated symptoms include arthralgias.    Past Medical History  Diagnosis Date  . Osteoarthrosis, unspecified whether generalized or localized, forearm     bilaterally wrist  . Allergic rhinitis, cause unspecified   . Senile osteoporosis   . Malignant neoplasm of breast (female), unspecified site 1990    left. Had surgerey and chemo, but no radiation  . Acquired cyst of kidney     left  . 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 Anemia, unspecified   . Unspecified gastritis and gastroduodenitis without mention of hemorrhage   . 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 Other and unspecified nonspecific immunological findings     positive ANA  . History of Epstein-Barr virus infection   . Herpes simplex without mention of complication   . Insomnia, unspecified   . Unspecified hypothyroidism   . Hyposmolality and/or hyponatremia   . Unspecified vitamin D deficiency   . Pure hypercholesterolemia   . Other B-complex deficiencies   . Osteoarthrosis of knee     bilaterally  . Closed  fracture of unspecified part of vertebral column without mention of spinal cord injury     T7 s/p kyphoplasty, T12  . Unspecified essential hypertension   . Pain in joint, site unspecified     severe, diffuse, chronic pain  . Osteoarthrosis, hip   . Fracture of left hip ([place]) 06/13/2007  . Edema 2015  . CKD stage 2 due to type 2 diabetes mellitus ([place]) 11/18/2014  . Neuropathy ([place])   . Positive ANA (antinuclear antibody)   . Insomnia    Past Surgical History  Procedure Laterality Date  . Dilation and curettage of uterus      x2  . Middle ear surgery Bilateral 1938  . Mastectomy Left 04/29/1989  . Tonsillectomy  1968  . Nasal sinus surgery  1990 & 2000  . Cholecystectomy, laparoscopic  09/20/2006  . Orif hip fracture Left 06/13/2007    following a syncopal episode  . Kyphoplasty  07/03/2010    T7 and T12  . Kyphoplasty     No family history on file. Social History  Substance Use Topics  . Smoking status: Former Research scientist (life sciences)  . Smokeless tobacco: Never Used  . Alcohol Use: No   OB History    No data available     Review of Systems  Musculoskeletal: Positive for arthralgias.  All other systems reviewed and are negative.     Allergies  Aspirin and Penicillins  Home Medications   Prior to Admission medications   Medication  Sig Start Date End Date Taking? Authorizing Provider  amLODipine (NORVASC) 5 MG tablet TAKE 1 TABLET BY MOUTH TWICE DAILY TO CONTROL BLOOD PRESSURE 01/15/15   Estill Dooms, MD  aspirin 81 MG tablet Take 81 mg by mouth daily.    Historical Provider, MD  atenolol (TENORMIN) 25 MG tablet Take one tablet by mouth once daily for blood pressure 02/12/15   Lauree Chandler, NP  Cyanocobalamin (VITAMIN B-12 CR PO) Take by mouth. Take one daily    Historical Provider, MD  diclofenac (VOLTAREN) 50 MG EC tablet Take one tablet by mouth twice daily to help arthritis pain 06/04/14   Gildardo Cranker, DO  irbesartan (AVAPRO) 300 MG tablet TAKE ONE TABLET BY MOUTH ONCE DAILY  FOR BLOOD PRESSURE 12/11/14   Estill Dooms, MD  levothyroxine (SYNTHROID, LEVOTHROID) 112 MCG tablet Take one tablet by mouth once daily for thyroid supplement 03/26/14   Estill Dooms, MD  levothyroxine (SYNTHROID, LEVOTHROID) 112 MCG tablet TAKE 1 TABLET BY MOUTH DAILY FOR THYROID SUPPLEMENT 01/01/15   Estill Dooms, MD  oxyCODONE-acetaminophen (PERCOCET/ROXICET) 5-325 MG tablet Take one tablet every 4 hours as needed to control pain 02/17/15   Estill Dooms, MD  zolpidem (AMBIEN) 10 MG tablet Take one tablet by mouth by mouth at bedtime for sleep 03/17/15   Estill Dooms, MD   BP 109/57 mmHg  Pulse 58  Temp(Src) 97.7 F (36.5 C) (Oral)  Resp 16  SpO2 98% Physical Exam  Constitutional: She is oriented to person, place, and time. She appears well-developed and well-nourished. No distress.  HENT:  Head: Normocephalic and atraumatic.  Eyes: Conjunctivae and EOM are normal.  Neck: Normal range of motion.  Cardiovascular: Normal rate, regular rhythm and intact distal pulses.  Exam reveals no gallop and no friction rub.   No murmur heard. Pulmonary/Chest: Effort normal and breath sounds normal. She has no wheezes. She has no rales. She exhibits no tenderness.  Abdominal: Soft. She exhibits no distension. There is no tenderness. There is no rebound.  Musculoskeletal:  Limited ROM of the right hip due to pain. Patient's right leg is externally rotated at the hip and the leg appears shortened. There is anterior and lateral right hip tenderness to palpation.   Neurological: She is alert and oriented to person, place, and time. Coordination normal.  Speech is goal-oriented. Moves limbs without ataxia.   Skin: Skin is warm and dry.  Psychiatric: She has a normal mood and affect.  Nursing note and vitals reviewed.   ED Course  Procedures (including critical care time) Labs Review Labs Reviewed  CBC WITH DIFFERENTIAL/PLATELET - Abnormal; Notable for the following:    RBC 3.67 (*)     Hemoglobin 11.8 (*)    HCT 34.1 (*)    Neutro Abs 8.0 (*)    All other components within normal limits  BASIC METABOLIC PANEL - Abnormal; Notable for the following:    Sodium 133 (*)    Chloride 100 (*)    Glucose, Bld 133 (*)    BUN 33 (*)    Creatinine, Ser 1.20 (*)    Calcium 8.6 (*)    GFR calc non Af Amer 40 (*)    GFR calc Af Amer 46 (*)    All other components within normal limits    Imaging Review Dg Hip Unilat  With Pelvis 2-3 Views Right  03/18/2015  CLINICAL DATA:  Patient fell wall walking to the bathroom. Obvious deformity to the right  hip. EXAM: DG HIP (WITH OR WITHOUT PELVIS) 2-3V RIGHT COMPARISON:  None. FINDINGS: Acute comminuted inter trochanteric fracture of the right hip with varus angulation of the fracture fragments and mild impaction. Acute fracture of the innominate bone with displacement. Acute fracture of the right inferior pubic ramus. No dislocation of the hip joint. No focal bone lesions identified. Vascular calcifications. IMPRESSION: Acute comminuted inter trochanteric fracture of the right hip with varus angulation and mild impaction. Fractures of the right inferior pubic ramus and innominate bone. Electronically Signed   By: Lucienne Capers M.D.   On: 03/18/2015 02:27   I have personally reviewed and evaluated these images and lab results as part of my medical decision-making.   EKG Interpretation None      MDM   Final diagnoses:  Fracture, intertrochanteric, right femur, closed, initial encounter ([place])  Fall, initial encounter    2:48 AM Patient has acute comminuted inter trochanteric fracture of the right hip with varus angulation and mild impaction. I will consult orthopedics. No neurovascular compromise or other injury.     Alvina Chou, PA-C 03/18/15 0401  Alvina Chou, PA-C 03/18/15 [place], DO 03/18/15 [phone]

## 2015-03-18 NOTE — Anesthesia Procedure Notes (Signed)
Procedure Name: Intubation Date/Time: 03/18/2015 5:34 PM Performed by: Maxwell Caul Pre-anesthesia Checklist: Patient identified, Emergency Drugs available, Suction available and Patient being monitored Patient Re-evaluated:Patient Re-evaluated prior to inductionOxygen Delivery Method: Circle System Utilized Preoxygenation: Pre-oxygenation with 100% oxygen Intubation Type: IV induction Ventilation: Mask ventilation without difficulty Laryngoscope Size: Mac and 4 Grade View: Grade I Tube type: Oral Tube size: 7.5 mm Number of attempts: 1 Airway Equipment and Method: Stylet Placement Confirmation: ETT inserted through vocal cords under direct vision,  positive ETCO2 and breath sounds checked- equal and bilateral Secured at: 21 cm Tube secured with: Tape Dental Injury: Teeth and Oropharynx as per pre-operative assessment

## 2015-03-19 ENCOUNTER — Encounter (HOSPITAL_COMMUNITY): Payer: Self-pay | Admitting: Orthopedic Surgery

## 2015-03-19 DIAGNOSIS — I10 Essential (primary) hypertension: Secondary | ICD-10-CM

## 2015-03-19 DIAGNOSIS — E039 Hypothyroidism, unspecified: Secondary | ICD-10-CM

## 2015-03-19 DIAGNOSIS — D62 Acute posthemorrhagic anemia: Secondary | ICD-10-CM

## 2015-03-19 LAB — CBC
HCT: 25 % — ABNORMAL LOW (ref 36.0–46.0)
Hemoglobin: 8.7 g/dL — ABNORMAL LOW (ref 12.0–15.0)
MCH: 32 pg (ref 26.0–34.0)
MCHC: 34.8 g/dL (ref 30.0–36.0)
MCV: 91.9 fL (ref 78.0–100.0)
PLATELETS: 106 10*3/uL — AB (ref 150–400)
RBC: 2.72 MIL/uL — ABNORMAL LOW (ref 3.87–5.11)
RDW: 12.3 % (ref 11.5–15.5)
WBC: 6.9 10*3/uL (ref 4.0–10.5)

## 2015-03-19 LAB — BASIC METABOLIC PANEL
Anion gap: 6 (ref 5–15)
BUN: 24 mg/dL — AB (ref 6–20)
CO2: 22 mmol/L (ref 22–32)
CREATININE: 1.05 mg/dL — AB (ref 0.44–1.00)
Calcium: 7.9 mg/dL — ABNORMAL LOW (ref 8.9–10.3)
Chloride: 107 mmol/L (ref 101–111)
GFR calc Af Amer: 55 mL/min — ABNORMAL LOW (ref >60)
GFR, EST NON AFRICAN AMERICAN: 47 mL/min — AB (ref >60)
GLUCOSE: 170 mg/dL — AB (ref 65–99)
POTASSIUM: 4.2 mmol/L (ref 3.5–5.1)
Sodium: 135 mmol/L (ref 135–145)

## 2015-03-19 LAB — IRON AND TIBC
Iron: 38 ug/dL (ref 28–170)
Saturation Ratios: 16 % (ref 10.4–31.8)
TIBC: 244 ug/dL — ABNORMAL LOW (ref 250–450)
UIBC: 206 ug/dL

## 2015-03-19 LAB — VITAMIN B12: Vitamin B-12: 209 pg/mL (ref 180–914)

## 2015-03-19 LAB — TSH: TSH: 0.613 u[IU]/mL (ref 0.350–4.500)

## 2015-03-19 LAB — MAGNESIUM: Magnesium: 2 mg/dL (ref 1.7–2.4)

## 2015-03-19 LAB — FOLATE: FOLATE: 31 ng/mL (ref >5.9)

## 2015-03-19 LAB — RETICULOCYTES
RBC.: 2.62 MIL/uL — AB (ref 3.87–5.11)
RETIC COUNT ABSOLUTE: 47.2 10*3/uL (ref 19.0–186.0)
RETIC CT PCT: 1.8 % (ref 0.4–3.1)

## 2015-03-19 LAB — FERRITIN: FERRITIN: 63 ng/mL (ref 11–307)

## 2015-03-19 MED ORDER — METHOCARBAMOL 1000 MG/10ML IJ SOLN
500.0000 mg | Freq: Once | INTRAVENOUS | Status: AC
  Administered 2015-03-19: 500 mg via INTRAVENOUS
  Filled 2015-03-19: qty 5

## 2015-03-19 MED ORDER — LIP MEDEX EX OINT
TOPICAL_OINTMENT | CUTANEOUS | Status: AC
  Administered 2015-03-19: 12:00:00
  Filled 2015-03-19: qty 7

## 2015-03-19 MED ORDER — ATENOLOL 25 MG PO TABS: 25.0000 mg | ORAL_TABLET | Freq: Every day | ORAL | Status: DC

## 2015-03-19 MED ORDER — SODIUM CHLORIDE 0.9 % IV SOLN
INTRAVENOUS | Status: AC
  Administered 2015-03-19 (×2): via INTRAVENOUS

## 2015-03-19 NOTE — Clinical Social Work Note (Signed)
Clinical Social Work Assessment  Patient Details  Name: Bailey [name] MRN: [id number] Date of Birth: 05/08/1927  Date of referral:  03/19/15               Reason for consult:  Facility Placement, Discharge Planning                Permission sought to share information with:  Facility Art therapist granted to share information::  Yes, Verbal Permission Granted  Name::        Agency::     Relationship::     Contact Information:     Housing/Transportation Living arrangements for the past 2 months:  [place] [facility] of Information:  Patient, Facility Patient Interpreter Needed:  None Criminal Activity/Legal Involvement Pertinent to Current Situation/Hospitalization:  No - Comment as needed Significant Relationships:  Spouse Lives with:  Spouse Do you feel safe going back to the place where you live?   (ST Rehab is needed.) Need for family participation in patient care:  No (Coment)  Care giving concerns: Pt's care cannot be managed at home following hospital d/c.   Social Worker assessment / plan:  Pt hospitalized on 03/18/15 from [place] with a hip fx. Pt has surgery on 11/2. Pt will need ST Rehab at d/c. CSW met with pt / spouse to assist with d/c planning. Pt is in agreement with d/c plan and would like to have rehab at [facility] SNF contacted and d/c plan confirmed. CSW will continue to follow pt to assist with d/c planning to SNF.  Employment status:  Retired Forensic scientist:  Medicare PT Recommendations:  [place] / Referral to community resources:  [place]  Patient/Family's Response to care:  Pt / spouse feel ST rehab is needed.  Patient/Family's Understanding of and Emotional Response to Diagnosis, Current Treatment, and Prognosis: Pt / spouse are aware of pt's medical status. Pt / spouse are pleased that pt will have ST Rehab at [facility].  Emotional Assessment Appearance:  Appears stated age Attitude/Demeanor/Rapport:  Other (cooperative) Affect (typically observed):  Calm, Pleasant Orientation:  Oriented to Self, Oriented to Place, Oriented to  Time, Oriented to Situation Alcohol / Substance use:  Not Applicable Psych involvement (Current and /or in the community):  No (Comment)  Discharge Needs  Concerns to be addressed:  Discharge Planning Concerns Readmission within the last 30 days:  No Current discharge risk:  None Barriers to Discharge:  No Barriers Identified   Loraine Maple  [phone] 03/19/2015, 12:44 PM

## 2015-03-19 NOTE — Evaluation (Signed)
Occupational Therapy Evaluation Patient Details Name: Bailey [name] MRN: [id number] DOB: 05-29-1926 Today's Date: 03/19/2015    History of Present Illness pt is s/p R IM nail after fall resulting in R intertrochanteric femur fx   Clinical Impression   This 81 year old female was admitted after a fall and is s/p R IM nail. She will benefit from skilled OT to increase safety and independence with adls.  Goals in acute are for min to mod A +2 for safety.  Pt was independent prior to admission.    Follow Up Recommendations  SNF    Equipment Recommendations  3 in 1 bedside comode    Recommendations for Other Services       Precautions / Restrictions Precautions Precautions: Fall Restrictions RLE Weight Bearing: Weight bearing as tolerated      Mobility Bed Mobility Overal bed mobility: +2 for physical assistance             General bed mobility comments: total A +2 for supine to sit with HOB raised; pt could not tolerate sliding one leg over at a time; had a tendency to lean posteriorly during bed mobility  Transfers Overall transfer level: Needs assistance Equipment used: Rolling walker (2 wheeled) Transfers: Sit to/from [facility] Sit to Stand: Mod assist;+2 physical assistance Stand pivot transfers: Mod assist;Max assist;+2 physical assistance       General transfer comment: +2 mod A to L and +2 max A to R.  Moved chair and commode closer as pt unable to step backwards    Balance                                            ADL Overall ADL's : Needs assistance/impaired     Grooming: Set up;Sitting   Upper Body Bathing: Set up;Standing   Lower Body Bathing: Maximal assistance;+2 for physical assistance;Sit to/from stand   Upper Body Dressing : Set up;Sitting   Lower Body Dressing: Total assistance;+2 for physical assistance;Sit to/from stand   Toilet Transfer: Maximal assistance;+2 for physical  assistance;Stand-pivot;BSC   Toileting- Clothing Manipulation and Hygiene: Maximal assistance;+2 for physical assistance;Sit to/from stand         General ADL Comments: moved [facility] closer to pt. She was unable to step backwards to this.  Pt did better with pivot to L than R (mod A +2)  did not educate on AE this visit--pt with painful RUE. Will see if she is able to tolerate working with this     Estate agent      Pertinent Vitals/Pain Pain Assessment: Faces Faces Pain Scale: Hurts even more Pain Location: R hip with movement Pain Descriptors / Indicators: Aching Pain Intervention(s): Limited activity within patient's tolerance;Monitored during session;Premedicated before session;Repositioned;Ice applied     Hand Dominance Right   Extremity/Trunk Assessment Upper Extremity Assessment Upper Extremity Assessment: RUE deficits/detail;Generalized weakness RUE Deficits / Details: R shoulder painful; movement limited by patient due to pain           Communication Communication Communication: No difficulties   Cognition Arousal/Alertness: Awake/alert Behavior During Therapy: WFL for tasks assessed/performed Overall Cognitive Status: Within Functional Limits for tasks assessed                     General Comments       Exercises  Shoulder Instructions      Home Living                                          Prior Functioning/Environment Level of Independence: Independent             OT Diagnosis: Acute pain;Generalized weakness   OT Problem List: Decreased strength;Decreased activity tolerance;Impaired balance (sitting and/or standing);Decreased knowledge of use of DME or AE;Pain;Impaired UE functional use   OT Treatment/Interventions: Self-care/ADL training;DME and/or AE instruction;Patient/family education;Balance training;Splinting    OT Goals(Current goals can be found in the care plan section) Acute  Rehab OT Goals Patient Stated Goal: decreased pain OT Goal Formulation: With patient Time For Goal Achievement: 03/26/15 Potential to Achieve Goals: Good ADL Goals Pt Will Transfer to Toilet: with mod assist;with +2 assist;bedside commode;stand pivot transfer (to bil sides) Additional ADL Goal #1: pt will go from sit to stand with min A +2 for adls and maintain for 2 minutes with min A +1 Additional ADL Goal #2: pt will use AE for adls with set up/cues from seated position  OT Frequency: Min 2X/week   Barriers to D/C:            Co-evaluation PT/OT/SLP Co-Evaluation/Treatment: Yes Reason for Co-Treatment: For patient/therapist safety PT goals addressed during session: Mobility/safety with mobility OT goals addressed during session: ADL's and self-care      End of Session    Activity Tolerance: Patient limited by fatigue Patient left: in chair;with call bell/phone within reach (no chair alarm pad available)   Time: 5449-2010 OT Time Calculation (min): 35 min Charges:  OT General Charges $OT Visit: 1 Procedure OT Evaluation $Initial OT Evaluation Tier I: 1 Procedure G-Codes:    Bailey [name] 03/23/15, 10:45 AM Bailey [name], OTR/L [id number] 2015/03/23

## 2015-03-19 NOTE — Care Management Note (Signed)
Case Management Note  Patient Details  Name: CEDRICKA SACKRIDER MRN: [id number] Date of Birth: [birth year]  Subjective/Objective:                   INTRAMEDULLARY (IM) NAIL FEMORAL (Right) Action/Plan: Discharge planning  Expected Discharge Date:                Expected Discharge Plan:  [place]  In-House Referral:     Discharge planning Services  CM Consult  Post Acute Care Choice:    Choice offered to:     DME Arranged:    DME Agency:     HH Arranged:    [place] Agency:     Status of Service:  Completed, signed off  Medicare Important Message Given:    Date Medicare IM Given:    Medicare IM give by:    Date Additional Medicare IM Given:    Additional Medicare Important Message give by:     If discussed at [place] of Stay Meetings, dates discussed:    Additional Comments: Utilization Review complete.  CM notes pt to go to SNF; CSW arranging.  No other CM needs were communicated. Dellie Catholic, RN 03/19/2015, 11:27 AM

## 2015-03-19 NOTE — Op Note (Signed)
[name], [name] NO.:  [id number]  MEDICAL RECORD NO.:  [id number]  LOCATION:  43                         FACILITY:  [facility]  PHYSICIAN:  Rod Can, MD     DATE OF BIRTH:  1926-09-10  DATE OF PROCEDURE:  03/18/2015 DATE OF DISCHARGE:                              OPERATIVE REPORT   SURGEON:  Rod Can, MD.  ASSISTANT:  None.  PREOPERATIVE DIAGNOSIS:  Comminuted right pertrochanteric femur fracture.  POSTOPERATIVE DIAGNOSIS:  Comminuted right pertrochanteric femur fracture.  PROCEDURE PERFORMED:  Intramedullary fixation of right pertrochanteric femur fracture.  IMPLANTS: 1. Synthes TFNA titanium nail 11 x 170 mm, 130 degrees. 2. A 010 mm helical blade. 3. A 5.0 mm distal interlocking screw x1.  ANESTHESIA:  General.  SPECIMENS:  None.  TUBES AND DRAINS:  None.  ANTIBIOTICS:  A 900 mg clindamycin.  COMPLICATIONS:  None.  DISPOSITION:  Stable to PACU.  INDICATIONS:  The patient is an 81 year old female who sustained a ground level fall in her bathroom last night.  She had right hip pain and inability to weight bear.  She was brought to the emergency department, and radiographic workup revealed a right high superior ramus fracture, right inferior ramus fracture, and comminuted right pertrochanteric femur fracture.  She was admitted to the Hospitalist Service.  She underwent perioperative risk stratification, medical optimization.  Risks, benefits, alternatives to intramedullary fixation of her right intertrochanteric femur fracture were explained, and she elected to proceed.  DESCRIPTION OF PROCEDURE IN DETAIL:  I marked the surgical site.  She was taken to the operating room.  General anesthesia was induced on the bed.  She was transferred to the [facility] table.  The left lower extremity was scissored underneath the right.  I reduced the fracture with standard traction, internal rotation, and adduction.  The right lower extremity  was prepped and draped in normal sterile surgical fashion. Time-out was called verifying site and site of surgery.  She did receive IV antibiotics within 60 minutes beginning the procedure.  I began by using fluoroscopy to define her anatomy.  I made a 4 cm incision proximal to the tip of the trochanter.  I used an awl to obtain the standard starting point for trochanteric entry nail, and the guide pin was advanced.  I then used the entry reamer.  I pulled all the hardware; and then, I placed the real nail through a separate stab incision.  I inserted the guidewire for the cephalomedullary device through the cannula.  I then checked this with AP and lateral fluoroscopy views.  I measured, reamed, and placed the real TFNA blade.  I tightened the set screw through the jig.  I placed the distal interlocking screw.  The jig was removed.  I then obtained AP and lateral fluoroscopy views.  The fracture was near anatomic.  Tip-apex distance was appropriate.  No chondral penetration.  I copiously irrigated the wounds with sterile saline.  I closed the wounds in layers with #1 Vicryl for the fascia, 2- 0 Monocryl for the deep dermal layer, with 3-0 running Monocryl subcuticular stitch.  I applied glue to the skin.  Once the glue was  dry, Mepilex dressings were applied.  Sponge, needle, and instrument counts were correct at the end of the case x2.  There were no known complications.  EBL was about 100 mL.  I discussed the operative events and findings with the patient's family. She may weight-bear as tolerated with a walker.  We will place her on aspirin for DVT prophylaxis.  She will receive physical and occupational therapy.  She will be readmitted to the Hospitalist.  We will work on disposition planning.  All questions solicited and answered to their satisfaction.          ______________________________ Rod Can, MD     BS/MEDQ  D:  03/18/2015  T:  03/19/2015  Job:  [id number]

## 2015-03-19 NOTE — Progress Notes (Signed)
[facility] PROGRESS NOTE  Bailey [name] ZHG:[id number] DOB: December 23, 1926 DOA: 03/18/2015 PCP: Estill Dooms, MD  Assessment/Plan:  #1 comminuted right peritrochateric femur fracture/ mildly displaced fracture junction of right superior pubic ramus and acetabulum/  Mildly displaced fracture right inferior pubic ramus   Secondary to mechanical fall. Patient status post supplementary fixation right peritrochaneric femur fracture per Dr. Lyla Glassing 03/19/2015. PT/OT. Pain management.  Will likely need skilled nursing facility.Per orthopedics.  #2 hypertension Blood pressure borderline. Will hold patient's  ARB and norvasc.  Continue atenolol.   #3 postoperative acute blood loss anemia Patient without overt bleeding. Check an anemia panel. Follow H&H. Transfusion threshold hemoglobin less than 7.   #4  hypothyroidism TSH = 0.613. Continue home dose Synthroid.  #5  Chronic kidney disease stage II Stable.  #6  Prophylaxis  DVT prophylaxis per orthopdics   Code Status:  full Family Communication:  Updated patient and husband at bedside. Disposition Plan:   Skilled nursing facility when medically stable.   Consultants:   orthopedics: Dr. Veverly Fells 11/0/2016  Procedures:   supplementary fixation of right  Peritrochanteric femur fracture per Dr. Lyla Glassing 03/19/2015   plain films right fmur 03/18/2015   plain flms of the right hip and pelvis 03/18/2015   CT pelvis 11/02/216  Antibiotics:  None  HPI/Subjective:  patient complaining of a cough. Patient denies any chest pain. No shortness of breath. Patient complainig of right hip pain.  Objective: Filed Vitals:   03/19/15 0925  BP: 97/46  Pulse: 59  Temp: 98.1 F (36.7 C)  Resp:     Intake/Output Summary (Last 24 hours) at 03/19/15 1004 Last data filed at 03/19/15 0755  Gross per 24 hour  Intake   1040 ml  Output   1625 ml  Net   -585 ml   Filed Weights   03/18/15 2130  Weight: 58.968 kg (130 lb)     Exam:   General:NAD  Cardiovascular: RRR  Respiratory: CTAB  Abdomen:  Soft, nontender, nondistended, positive bowel sounds.  Musculoskeletal:  No c/c/e  Data Reviewed: Basic Metabolic Panel:  Recent Labs Lab 03/18/15 0237 03/19/15 0415  NA 133* 135  K 3.8 4.2  CL 100* 107  CO2 25 22  GLUCOSE 133* 170*  BUN 33* 24*  CREATININE 1.20* 1.05*  CALCIUM 8.6* 7.9*  MG  --  2.0   Liver Function Tests: No results for input(s): AST, ALT, ALKPHOS, BILITOT, PROT, ALBUMIN in the last 168 hours. No results for input(s): LIPASE, AMYLASE in the last 168 hours. No results for input(s): AMMONIA in the last 168 hours. CBC:  Recent Labs Lab 03/18/15 0237 03/19/15 0415  WBC 9.9 6.9  NEUTROABS 8.0*  --   HGB 11.8* 8.7*  HCT 34.1* 25.0*  MCV 92.9 91.9  PLT 165 106*   Cardiac Enzymes: No results for input(s): CKTOTAL, CKMB, CKMBINDEX, TROPONINI in the last 168 hours. BNP (last 3 results) No results for input(s): BNP in the last 8760 hours.  ProBNP (last 3 results) No results for input(s): PROBNP in the last 8760 hours.  CBG: No results for input(s): GLUCAP in the last 168 hours.  No results found for this or any previous visit (from the past 240 hour(s)).   Studies: Ct Pelvis Wo Contrast  03/18/2015  CLINICAL DATA:  Acute pelvic pain after fall at home last night. EXAM: CT PELVIS WITHOUT CONTRAST TECHNIQUE: Multidetector CT imaging of the pelvis was performed following the standard protocol without intravenous contrast. COMPARISON:  None. FINDINGS: Status  post surgical internal fixation of old proximal left femoral fracture, with intra medullary rod fixation of the left femoral shaft. Moderately comminuted and displaced fracture is seen involving the intertrochanteric region of the proximal right femur. Mildly displaced fracture is noted at the junction of the right superior pubic ramus and acetabulum. Mildly displaced fracture is seen involving the right inferior pubic  ramus. The sacrum appears normal. Sacroiliac joints appear normal. Atherosclerosis of abdominal aorta is noted. Urinary bladder is decompressed secondary to Foley catheter. IMPRESSION: Moderately comminuted and displaced fracture involving the intertrochanteric region of the proximal right femur. Mildly displaced fracture is noted at the junction of the right superior pubic ramus and acetabulum. Mildly displaced fracture is seen involving the right inferior pubic ramus. Electronically Signed   By: Marijo Conception, M.D.   On: 03/18/2015 10:59   Pelvis Portable  03/18/2015  CLINICAL DATA:  Right femur fracture EXAM: PORTABLE PELVIS 1-2 VIEWS COMPARISON:  0900 hours FINDINGS: Dynamic compression screw and intra medullary rod transfix an intertrochanteric proximal right femur fracture. There is a single distal interlocking screw. Anatomic alignment of the larger fracture fragments. No breakage or loosening of the hardware. Osteopenia. A minimally displaced right acetabular fracture is re- demonstrated. IMPRESSION: ORIF intertrochanteric right femur fracture. Stable right acetabular fracture. Electronically Signed   By: Marybelle Killings M.D.   On: 03/18/2015 19:15   Ct Hip Right Wo Contrast  03/18/2015  CLINICAL DATA:  Right hip fracture . EXAM: CT OF THE RIGHT HIP WITHOUT CONTRAST TECHNIQUE: Multidetector CT imaging of the right hip was performed according to the standard protocol. Multiplanar CT image reconstructions were also generated. COMPARISON:  Radiographs 03/18/2015 FINDINGS: There is an intertrochanteric right hip fracture with varus angulation and mild impaction. There is no dislocation. There is a mildly comminuted inferior right pubic ramus fracture. There is a right superior ramus fracture extending into the anterior acetabular column. Central and posterior acetabulum intact. Superior acetabulum intact. Sacrum and sacroiliac joint not included within the field of view. Pubic symphysis appears intact. No  large hematoma IMPRESSION: Intertrochanteric right hip fracture with varus angulation and mild impaction. Superior and inferior pubic ramus fractures on the right, with minimally displaced anterior acetabular column fracture. Electronically Signed   By: Andreas Newport M.D.   On: 03/18/2015 05:19   Dg C-arm 1-60 Min-no Report  03/18/2015  CLINICAL DATA: right im nail hip C-ARM 1-60 MINUTES Fluoroscopy was utilized by the requesting physician.  No radiographic interpretation.   Dg Hip Operative Unilat With Pelvis Right  03/18/2015  CLINICAL DATA:  ORIF right proximal femur fracture EXAM: DG C-ARM 1-60 MIN-NO REPORT; OPERATIVE RIGHT HIP WITH PELVIS COMPARISON:  Right hip radiographs from earlier today FINDINGS: Fluoroscopy time 57 seconds. Three spot fluoroscopic nondiagnostic intraoperative radiographs of the right hip demonstrate intra medullary rod in the right proximal femur with interlocking right femoral neck pain and single distal interlocking screw transfixing the intratrochanteric right femur fracture. IMPRESSION: Intraoperative guidance for ORIF right proximal femur fracture. Electronically Signed   By: Ilona Sorrel M.D.   On: 03/18/2015 19:04   Dg Hip Unilat  With Pelvis 2-3 Views Right  03/18/2015  CLINICAL DATA:  Patient fell wall walking to the bathroom. Obvious deformity to the right hip. EXAM: DG HIP (WITH OR WITHOUT PELVIS) 2-3V RIGHT COMPARISON:  None. FINDINGS: Acute comminuted inter trochanteric fracture of the right hip with varus angulation of the fracture fragments and mild impaction. Acute fracture of the innominate bone with displacement. Acute fracture  of the right inferior pubic ramus. No dislocation of the hip joint. No focal bone lesions identified. Vascular calcifications. IMPRESSION: Acute comminuted inter trochanteric fracture of the right hip with varus angulation and mild impaction. Fractures of the right inferior pubic ramus and innominate bone. Electronically Signed   By:  Lucienne Capers M.D.   On: 03/18/2015 02:27   Dg Femur, Min 2 Views Right  03/18/2015  CLINICAL DATA:  [place] this morning getting out of bed to use the bathroom, tripped and landed on RIGHT hip, pain and inability to ambulate since, deformity EXAM: RIGHT FEMUR 2 VIEWS COMPARISON:  RIGHT hip radiographs 02/15/2015 FINDINGS: Osseous demineralization. Displaced intertrochanteric fracture RIGHT femur with varus angulation. No dislocation. Additionally identified displaced fractures of RIGHT superior and inferior pubic rami. Remainder of RIGHT femur appears intact. Knee joint alignment grossly normal. Extensive atherosclerotic calcifications. IMPRESSION: Displaced angulated intertrochanteric fracture RIGHT femur. Displaced fractures RIGHT superior and inferior pubic rami. Osseous demineralization. Electronically Signed   By: Lavonia Dana M.D.   On: 03/18/2015 10:37    Scheduled Meds: . amLODipine  5 mg Oral BID  . aspirin EC  325 mg Oral BID PC  . atenolol  25 mg Oral Daily  . cholecalciferol  3,000 Units Oral Daily  . levothyroxine  112 mcg Oral QAC breakfast   Continuous Infusions: . sodium chloride      Principal Problem:   Fracture, intertrochanteric, right femur (HCC) Active Problems:   Anemia   Closed right hip fracture (HCC)   Pertrochanteric fracture of right femur (HCC)   Postoperative anemia due to acute blood loss    Time spent: [place] MD [facility] Pager [phone]. If 7PM-7AM, please contact night-coverage at [email or web address], password [facility] 03/19/2015, 10:04 AM  LOS: 1 day

## 2015-03-19 NOTE — Progress Notes (Signed)
Physical Therapy Treatment Patient Details Name: Bailey [name] MRN: [id number] DOB: 1926-11-02 Today's Date: 03/19/2015    History of Present Illness pt is s/p R IM nail   03/18/15 after fall resulting in R pertrochnateric femur fx    PT Comments    Patient sat on BSC  , unable  To void. RN aware. Patient is  Not placing any weight through the R leg during transfers.   Follow Up Recommendations  SNF;Supervision/Assistance - 24 hour     Equipment Recommendations  None recommended by PT    Recommendations for Other Services       Precautions / Restrictions Precautions Precautions: Fall Restrictions RLE Weight Bearing: Weight bearing as tolerated    Mobility  Bed Mobility Overal bed mobility: Needs Assistance Bed Mobility: Sit to Supine       Sit to supine: Total assist;+2 for physical assistance;+2 for safety/equipment   General bed mobility comments: assist for trunk and the legs back into bed.  Transfers Overall transfer level: Needs assistance Equipment used: Rolling walker (2 wheeled) Transfers: Sit to/from Stand Sit to Stand: Mod assist;+2 physical assistance;+2 safety/equipment Stand pivot transfers: Max assist;+2 physical assistance;+2 safety/equipment       General transfer comment: extra time, assist to power up, cues for hand placement, max assist from lower surface.  R leg supported  during sit/stand due to knee  tightness and increased pain . Assisted with stand pivot transfer to the [facility], no weight really on the R Leg. After toileting, Bed brought up to patient., 1 hop backwards, cues for posture and attempt to bear some weight.  Ambulation/Gait                 Stairs            Wheelchair Mobility    Modified Rankin (Stroke Patients Only)       Balance Overall balance assessment: History of Falls;Needs assistance Sitting-balance support: Feet supported;Bilateral upper extremity supported Sitting balance-Leahy Scale: Fair      Standing balance support: During functional activity;Bilateral upper extremity supported Standing balance-Leahy Scale: Poor                      Cognition Arousal/Alertness: Awake/alert                          Exercises      General Comments        Pertinent Vitals/Pain Faces Pain Scale: Hurts whole lot Pain Location: R hip and thigh Pain Descriptors / Indicators: Discomfort;Grimacing;Guarding;Tightness Pain Intervention(s): Limited activity within patient's tolerance;Monitored during session;Premedicated before session;Repositioned;Ice applied    Home Living                      Prior Function            PT Goals (current goals can now be found in the care plan section) Progress towards PT goals: Progressing toward goals    Frequency  Min 3X/week    PT Plan Current plan remains appropriate    Co-evaluation             End of Session Equipment Utilized During Treatment: Gait belt Activity Tolerance: Patient limited by pain;Patient limited by fatigue Patient left: in bed;with call bell/phone within reach (bed alarm does not set. RN aware.)     Time: 8502-7741 PT Time Calculation (min) (ACUTE ONLY): 37 min  Charges:  $Therapeutic Activity: 8-22 mins $Self  Care/Home Management: 8-22                    G Codes:      Claretha Cooper 03/19/2015, 3:44 PM Tresa Endo PT [phone]

## 2015-03-19 NOTE — Evaluation (Signed)
Physical Therapy Evaluation Patient Details Name: Bailey [name] MRN: [id number] DOB: 09-08-1926 Today's Date: 03/19/2015   History of Present Illness  pt is s/p R IM nail after fall resulting in R intertrochnateric femur fx  Clinical Impression  Patient did not place very much weight on the R leg. Extra time for mobility with 2 person assist.Pt admitted with above diagnosis. Pt currently with functional limitations due to the deficits listed below (see PT Problem List). Pt will benefit from skilled PT to increase their independence and safety with mobility to allow discharge to the venue listed below.       Follow Up Recommendations SNF;Supervision/Assistance - 24 hour    Equipment Recommendations       Recommendations for Other Services       Precautions / Restrictions Precautions Precautions: Fall Restrictions RLE Weight Bearing: Weight bearing as tolerated      Mobility  Bed Mobility Overal bed mobility: +2 for physical assistance             General bed mobility comments: total A +2 for supine to sit with HOB raised; pt could not tolerate sliding one leg over at a time; had a tendency to lean posteriorly during bed mobility  Transfers Overall transfer level: Needs assistance Equipment used: Rolling walker (2 wheeled) Transfers: Sit to/from Stand Sit to Stand: Mod assist;+2 physical assistance Stand pivot transfers: Mod assist;Max assist;+2 physical assistance       General transfer comment: +2 mod A to L and +2 max A to R.  Moved chair and commode closer as pt unable to step backwards, difficulty taking  a step on the R leg, scooted  more on the L foot.  Ambulation/Gait                Stairs            Wheelchair Mobility    Modified Rankin (Stroke Patients Only)       Balance                                             Pertinent Vitals/Pain Pain Assessment: Faces Faces Pain Scale: Hurts even more Pain Location: R  hip and thigh Pain Descriptors / Indicators: Discomfort;Grimacing;Guarding Pain Intervention(s): Limited activity within patient's tolerance;Monitored during session;Premedicated before session;Repositioned    Home Living Family/patient expects to be discharged to:: Skilled nursing facility                      Prior Function Level of Independence: Independent               Hand Dominance   Dominant Hand: Right    Extremity/Trunk Assessment   Upper Extremity Assessment: Defer to OT evaluation RUE Deficits / Details: R shoulder painful; movement limited by patient due to pain         Lower Extremity Assessment: RLE deficits/detail RLE Deficits / Details: required assistance for moving both legs to edge of the bed,    Cervical / Trunk Assessment: Normal  Communication   Communication: No difficulties  Cognition Arousal/Alertness: Awake/alert Behavior During Therapy: WFL for tasks assessed/performed Overall Cognitive Status: Within Functional Limits for tasks assessed                      General Comments      Exercises  Assessment/Plan    PT Assessment Patient needs continued PT services  PT Diagnosis Difficulty walking;Acute pain   PT Problem List Decreased strength;Decreased range of motion;Decreased activity tolerance;Decreased mobility;Decreased knowledge of use of DME;Decreased safety awareness;Decreased knowledge of precautions;Pain  PT Treatment Interventions DME instruction;Gait training;Functional mobility training;Therapeutic activities;Therapeutic exercise;Patient/family education   PT Goals (Current goals can be found in the Care Plan section) Acute Rehab PT Goals Patient Stated Goal: decreased pain, go to rehab PT Goal Formulation: With patient/family Time For Goal Achievement: 04/02/15 Potential to Achieve Goals: Good    Frequency Min 3X/week   Barriers to discharge Decreased caregiver support      Co-evaluation  PT/OT/SLP Co-Evaluation/Treatment: Yes Reason for Co-Treatment: For patient/therapist safety PT goals addressed during session: Mobility/safety with mobility OT goals addressed during session: ADL's and self-care       End of Session Equipment Utilized During Treatment: Gait belt Activity Tolerance: Patient limited by pain Patient left: in chair;with call bell/phone within reach;with family/visitor present Nurse Communication: Mobility status         Time: 6759-1638 PT Time Calculation (min) (ACUTE ONLY): 27 min   Charges:   PT Evaluation $Initial PT Evaluation Tier I: 1 Procedure     PT G CodesClaretha Cooper 03/19/2015, 1:51 PM Tresa Endo PT [phone]

## 2015-03-19 NOTE — Progress Notes (Signed)
   Subjective:  Patient reports pain as mild to moderate.  No events.  Objective:   VITALS:   Filed Vitals:   03/19/15 0032 03/19/15 0608 03/19/15 0925 03/19/15 1439  BP: 108/54 105/66 97/46 99/50   Pulse: 56 69 59 68  Temp: 97.7 F (36.5 C) 98.9 F (37.2 C) 98.1 F (36.7 C) 99.2 F (37.3 C)  TempSrc: Oral Oral Oral Oral  Resp: 12 12 12 12   Height:      Weight:      SpO2: 100% 100%  98%    ABD soft Sensation intact distally Intact pulses distally Dorsiflexion/Plantar flexion intact Incision: dressing C/D/I Compartment soft   Lab Results  Component Value Date   WBC 6.9 03/19/2015   HGB 8.7* 03/19/2015   HCT 25.0* 03/19/2015   MCV 91.9 03/19/2015   PLT 106* 03/19/2015   BMET    Component Value Date/Time   NA 135 03/19/2015 0415   NA 135* 03/09/2015   K 4.2 03/19/2015 0415   CL 107 03/19/2015 0415   CO2 22 03/19/2015 0415   GLUCOSE 170* 03/19/2015 0415   BUN 24* 03/19/2015 0415   BUN 26* 03/09/2015   CREATININE 1.05* 03/19/2015 0415   CREATININE 1.2* 03/09/2015   CALCIUM 7.9* 03/19/2015 0415   GFRNONAA 47* 03/19/2015 0415   GFRAA 55* 03/19/2015 0415     Assessment/Plan: 1 Day Post-Op   Principal Problem:   Fracture, intertrochanteric, right femur (HCC) Active Problems:   Anemia   Closed right hip fracture (HCC)   Pertrochanteric fracture of right femur (HCC)   Postoperative anemia due to acute blood loss   Thyroid activity decreased   WBAT with walker DVT ppx: ASA 325mg  PO BID, SCDs, TEDs PO pain control PT/OT D/C planning    Ezra Denne, Horald Pollen 03/19/2015, 6:18 PM   Rod Can, MD Cell [phone]

## 2015-03-19 NOTE — NC FL2 (Signed)
[place] LEVEL OF CARE SCREENING TOOL     IDENTIFICATION  Patient Name: Bailey [name] Birthdate: March 17, 1927 Sex: female Admission Date (Current Location): 03/18/2015  [place] and [place] Number:     Facility and Address:  [facility],  [place], [place]      Provider Number: [id number]  Attending Physician Name and Address:  Eugenie Filler, MD  Relative Name and Phone Number:       Current Level of Care: Hospital Recommended Level of Care: [place] Prior Approval Number:    Date Approved/Denied:   PASRR Number: [id number]  Discharge Plan: SNF    Current Diagnoses: Patient Active Problem List   Diagnosis Date Noted  . Postoperative anemia due to acute blood loss 03/19/2015  . Thyroid activity decreased   . Closed right hip fracture ([place]) 03/18/2015  . Pertrochanteric fracture of right femur ([place]) 03/18/2015  . Fracture, intertrochanteric, right femur ([place])   . CKD (chronic kidney disease) stage 2, GFR 60-89 ml/min 11/18/2014  . Osteoarthritis 03/25/2014  . Osteoarthrosis, forearm   . Senile osteoporosis   . Malignant neoplasm of breast (female), unspecified site   . Anemia   . Insomnia   . Hypothyroidism   . Pure hypercholesterolemia   . Closed fracture of unspecified part of vertebral column without mention of spinal cord injury   . Essential hypertension   . Osteoarthrosis, hip   . Edema     Orientation ACTIVITIES/SOCIAL BLADDER RESPIRATION    Self, Time, Situation, Place  Active Continent Normal  BEHAVIORAL SYMPTOMS/MOOD NEUROLOGICAL BOWEL NUTRITION STATUS   (no behaviors)   Continent Diet  PHYSICIAN VISITS COMMUNICATION OF NEEDS Height & Weight Skin  Over 180 days Verbally 5\' 8"  (172.7 cm) 130 lbs. Surgical wounds          AMBULATORY STATUS RESPIRATION    Assist extensive Normal      Personal Care Assistance Level of Assistance  Bathing, Dressing Bathing Assistance: Limited  assistance   Dressing Assistance: Limited assistance      Functional Limitations Info                SPECIAL CARE FACTORS FREQUENCY  PT (By licensed PT)     PT Frequency: 6/7 x wkly             Additional Factors Info                  Current Medications (03/19/2015): Current Facility-Administered Medications  Medication Dose Route Frequency Provider Last Rate Last Dose  . acetaminophen (TYLENOL) tablet 650 mg  650 mg Oral Q6H PRN Rod Can, MD       Or  . acetaminophen (TYLENOL) suppository 650 mg  650 mg Rectal Q6H PRN Rod Can, MD      . aspirin EC tablet 325 mg  325 mg Oral BID PC Rod Can, MD   325 mg at 03/19/15 0916  . atenolol (TENORMIN) tablet 25 mg  25 mg Oral Daily Etta Quill, DO   25 mg at 03/18/15 1152  . cholecalciferol (VITAMIN D) tablet 3,000 Units  3,000 Units Oral Daily Eugenie Filler, MD   3,000 Units at 03/18/15 1111  . HYDROcodone-acetaminophen (NORCO/VICODIN) 5-325 MG per tablet 1-2 tablet  1-2 tablet Oral Q6H PRN Rod Can, MD   1 tablet at 03/19/15 0532  . levothyroxine (SYNTHROID, LEVOTHROID) tablet 112 mcg  112 mcg Oral QAC breakfast Etta Quill, DO   112 mcg  at 03/18/15 0830  . menthol-cetylpyridinium (CEPACOL) lozenge 3 mg  1 lozenge Oral PRN Rod Can, MD       Or  . phenol (CHLORASEPTIC) mouth spray 1 spray  1 spray Mouth/Throat PRN Rod Can, MD      . metoCLOPramide (REGLAN) tablet 5-10 mg  5-10 mg Oral Q8H PRN Rod Can, MD       Or  . metoCLOPramide (REGLAN) injection 5-10 mg  5-10 mg Intravenous Q8H PRN Rod Can, MD      . morphine 2 MG/ML injection 0.5 mg  0.5 mg Intravenous Q2H PRN Rod Can, MD   0.5 mg at 03/19/15 0925  . ondansetron (ZOFRAN) tablet 4 mg  4 mg Oral Q6H PRN Rod Can, MD       Or  . ondansetron (ZOFRAN) injection 4 mg  4 mg Intravenous Q6H PRN Rod Can, MD       Do not use this list as official medication orders. Please verify with  discharge summary.  Discharge Medications:   Medication List    ASK your doctor about these medications        amLODipine 5 MG tablet  Commonly known as:  NORVASC  TAKE 1 TABLET BY MOUTH TWICE DAILY TO CONTROL BLOOD PRESSURE     aspirin 81 MG tablet  Take 81 mg by mouth daily.     atenolol 25 MG tablet  Commonly known as:  TENORMIN  Take one tablet by mouth once daily for blood pressure     cholecalciferol 1000 UNITS tablet  Commonly known as:  VITAMIN D  Take 3,000 Units by mouth daily.     denosumab 60 MG/ML Soln injection  Commonly known as:  PROLIA  Inject 60 mg into the skin every 6 (six) months. Administer in upper arm, thigh, or abdomen     diclofenac 50 MG EC tablet  Commonly known as:  VOLTAREN  Take one tablet by mouth twice daily to help arthritis pain     irbesartan 300 MG tablet  Commonly known as:  AVAPRO  TAKE ONE TABLET BY MOUTH ONCE DAILY FOR BLOOD PRESSURE     levothyroxine 112 MCG tablet  Commonly known as:  SYNTHROID, LEVOTHROID  Take one tablet by mouth once daily for thyroid supplement     lidocaine 5 %  Commonly known as:  LIDODERM  Place 1-3 patches onto the skin daily. Apply in AM and remove in PM. Remove & Discard patch within 12 hours or as directed by MD     oxyCODONE-acetaminophen 5-325 MG tablet  Commonly known as:  PERCOCET/ROXICET  Take one tablet every 4 hours as needed to control pain     VITAMIN B-12 CR PO  Take 1 tablet by mouth daily.     zolpidem 10 MG tablet  Commonly known as:  AMBIEN  Take one tablet by mouth by mouth at bedtime for sleep        Relevant Imaging Results:  Relevant Lab Results:  Recent Labs    Additional Information  SS # [id number]  Jarren Para, Randall An, LCSW

## 2015-03-20 DIAGNOSIS — S72101D Unspecified trochanteric fracture of right femur, subsequent encounter for closed fracture with routine healing: Secondary | ICD-10-CM

## 2015-03-20 LAB — BASIC METABOLIC PANEL
Anion gap: 7 (ref 5–15)
BUN: 30 mg/dL — AB (ref 6–20)
CHLORIDE: 105 mmol/L (ref 101–111)
CO2: 21 mmol/L — ABNORMAL LOW (ref 22–32)
Calcium: 7.6 mg/dL — ABNORMAL LOW (ref 8.9–10.3)
Creatinine, Ser: 1.2 mg/dL — ABNORMAL HIGH (ref 0.44–1.00)
GFR calc Af Amer: 46 mL/min — ABNORMAL LOW (ref >60)
GFR calc non Af Amer: 40 mL/min — ABNORMAL LOW (ref >60)
Glucose, Bld: 125 mg/dL — ABNORMAL HIGH (ref 65–99)
POTASSIUM: 3.7 mmol/L (ref 3.5–5.1)
SODIUM: 133 mmol/L — AB (ref 135–145)

## 2015-03-20 LAB — CBC
HCT: 23.4 % — ABNORMAL LOW (ref 36.0–46.0)
HEMOGLOBIN: 8.2 g/dL — AB (ref 12.0–15.0)
MCH: 32.4 pg (ref 26.0–34.0)
MCHC: 35 g/dL (ref 30.0–36.0)
MCV: 92.5 fL (ref 78.0–100.0)
Platelets: 112 10*3/uL — ABNORMAL LOW (ref 150–400)
RBC: 2.53 MIL/uL — AB (ref 3.87–5.11)
RDW: 12.7 % (ref 11.5–15.5)
WBC: 8.9 10*3/uL (ref 4.0–10.5)

## 2015-03-20 MED ORDER — MENTHOL 3 MG MT LOZG
1.0000 | LOZENGE | OROMUCOSAL | Status: DC | PRN
  Filled 2015-03-20: qty 9

## 2015-03-20 NOTE — Progress Notes (Signed)
   Subjective:  Patient reports pain as mild to moderate.  No events.  Objective:   VITALS:   Filed Vitals:   03/19/15 0925 03/19/15 1439 03/19/15 2115 03/20/15 0524  BP: 97/46 99/50 112/53 115/50  Pulse: 59 68 69 82  Temp: 98.1 F (36.7 C) 99.2 F (37.3 C) 97.9 F (36.6 C) 98.4 F (36.9 C)  TempSrc: Oral Oral Oral Oral  Resp: 12 12 15 16   Height:      Weight:      SpO2:  98% 95% 96%    ABD soft Sensation intact distally Intact pulses distally Dorsiflexion/Plantar flexion intact Incision: dressing C/D/I Compartment soft   Lab Results  Component Value Date   WBC 8.9 03/20/2015   HGB 8.2* 03/20/2015   HCT 23.4* 03/20/2015   MCV 92.5 03/20/2015   PLT 112* 03/20/2015   BMET    Component Value Date/Time   NA 133* 03/20/2015 0503   NA 135* 03/09/2015   K 3.7 03/20/2015 0503   CL 105 03/20/2015 0503   CO2 21* 03/20/2015 0503   GLUCOSE 125* 03/20/2015 0503   BUN 30* 03/20/2015 0503   BUN 26* 03/09/2015   CREATININE 1.20* 03/20/2015 0503   CREATININE 1.2* 03/09/2015   CALCIUM 7.6* 03/20/2015 0503   GFRNONAA 40* 03/20/2015 0503   GFRAA 46* 03/20/2015 0503     Assessment/Plan: 2 Days Post-Op   Principal Problem:   Fracture, intertrochanteric, right femur ([place]) Active Problems:   Anemia   Closed right hip fracture (HCC)   Pertrochanteric fracture of right femur (HCC)   Postoperative anemia due to acute blood loss   Thyroid activity decreased   WBAT with walker DVT ppx: ASA 325mg  PO BID, SCDs, TEDs PO pain control PT/OT D/C planning    Bailey [name], Bailey [name] 03/20/2015, 7:31 AM   Rod Can, MD Cell [phone]

## 2015-03-20 NOTE — Progress Notes (Signed)
Pt appears more comfortable, not restless, states her spasms are better.  Will continue to monitor.

## 2015-03-20 NOTE — Progress Notes (Signed)
Pt c/o severe right leg and thigh cramps.  Notified provider on call. Obtained order for one time dose of Robaxin IV.  Also applied warm blankets to area.  Pulse +2 and extremity warm in right leg.  Pt has full sensation.  Able to plantar flexion and dorsiflexion.  Will continue to monitor.

## 2015-03-20 NOTE — Progress Notes (Signed)
Physical Therapy Treatment Patient Details Name: Bailey [name] MRN: [id number] DOB: June 23, 1926 Today's Date: 03/20/2015    History of Present Illness pt is s/p R IM nail   03/18/15 after fall resulting in R pertrochnateric femur fx    PT Comments    POD # 2 am session.  Assisted pt OOB with extra care and increased time to decrease pain/anxiety. Max c/o R hip pain.  Sat EOB several min.  Then assisted to [facility] to void. Assisted with hygiene.  Attempted amb however pt was unable to functionally weight shift and unable to advance r LE.  Recliner brought from behind.    Follow Up Recommendations  SNF ([facility])     Equipment Recommendations       Recommendations for Other Services       Precautions / Restrictions Precautions Precautions: Fall Restrictions Weight Bearing Restrictions: No RLE Weight Bearing: Weight bearing as tolerated    Mobility  Bed Mobility Overal bed mobility: Needs Assistance Bed Mobility: Supine to Sit     Supine to sit: Max assist;+2 for physical assistance     General bed mobility comments: increased time and + 2 assist to transition from supine to EOB.    Transfers Overall transfer level: Needs assistance Equipment used: Rolling walker (2 wheeled) Transfers: Sit to/from Stand Sit to Stand: Mod assist;+2 physical assistance;+2 safety/equipment Stand pivot transfers: Max assist;+2 physical assistance;+2 safety/equipment       General transfer comment: extra time, assist to power up, cues for hand placement, max assist from lower surface.  R leg supported  during sit/stand due to knee  tightness and increased pain . assisted from bed to [facility] then to stand for amb.   Ambulation/Gait Ambulation/Gait assistance: Total assist;+2 physical assistance   Assistive device: Rolling walker (2 wheeled) Gait Pattern/deviations: Step-to pattern;Decreased step length - right;Decreased step length - left Gait velocity: decreased   General Gait Details: 2  small steps only.  Unable to functionally weight shift and unable to advance R LE.    Stairs            Wheelchair Mobility    Modified Rankin (Stroke Patients Only)       Balance                                    Cognition Arousal/Alertness: Awake/alert Behavior During Therapy: WFL for tasks assessed/performed Overall Cognitive Status: Within Functional Limits for tasks assessed                      Exercises      General Comments        Pertinent Vitals/Pain Pain Assessment: Faces Faces Pain Scale: Hurts whole lot Pain Location: R hip Pain Descriptors / Indicators: Discomfort;Grimacing;Guarding;Tightness Pain Intervention(s): Monitored during session;Repositioned;Ice applied    Home Living                      Prior Function            PT Goals (current goals can now be found in the care plan section) Progress towards PT goals: Progressing toward goals    Frequency  Min 3X/week    PT Plan Current plan remains appropriate    Co-evaluation             End of Session Equipment Utilized During Treatment: Gait belt Activity Tolerance: Patient limited by pain;Patient  limited by fatigue Patient left: in chair;with call bell/phone within reach;with family/visitor present     Time: 3817-7116 PT Time Calculation (min) (ACUTE ONLY): 30 min  Charges:  $Gait Training: 8-22 mins $Therapeutic Activity: 8-22 mins                    G Codes:      Rica Koyanagi  PTA [facility]  Acute  Rehab Pager      [phone]

## 2015-03-20 NOTE — Progress Notes (Signed)
[facility] PROGRESS NOTE  LAPARIS DURRETT ESP:[id number] DOB: 08-19-1926 DOA: 03/18/2015 PCP: Estill Dooms, MD  Assessment/Plan:  #1 comminuted right peritrochateric femur fracture/ mildly displaced fracture junction of right superior pubic ramus and acetabulum/  Mildly displaced fracture right inferior pubic ramus   Secondary to mechanical fall. Patient status post supplementary fixation right peritrochaneric femur fracture per Dr. Lyla Glassing 03/19/2015. PT/OT. Pain management.  Will likely need skilled nursing facility.Per orthopedics.  #2 hypertension Blood pressure borderline. Patient's antihypertensive medications on hold. Gentle hydration.   #3 postoperative acute blood loss anemia/anemia of chronic disease Patient without overt bleeding. Anemia panel consistent with anemia of chronic disease. Hemoglobin is stable. Transfusion threshold hemoglobin less than 7.   #4  hypothyroidism TSH = 0.613. Continue home dose Synthroid.  #5  Chronic kidney disease stage II Stable.  #6  Prophylaxis  DVT prophylaxis per orthopdics   Code Status:  full Family Communication:  Updated patient and husband at bedside. Disposition Plan:   Skilled nursing facility when medically stable, hopefully tomorrow.   Consultants:   orthopedics: Dr. Veverly Fells 11/0/2016  Procedures:   supplementary fixation of right  Peritrochanteric femur fracture per Dr. Lyla Glassing 03/19/2015   plain films right fmur 03/18/2015   plain flms of the right hip and pelvis 03/18/2015   CT pelvis 11/02/216  Antibiotics:  None  HPI/Subjective: Patient denies any chest pain. No shortness of breath. Patient c/o nausea.  Objective: Filed Vitals:   03/20/15 0524  BP: 115/50  Pulse: 82  Temp: 98.4 F (36.9 C)  Resp: 16    Intake/Output Summary (Last 24 hours) at 03/20/15 1201 Last data filed at 03/20/15 1000  Gross per 24 hour  Intake 2548.75 ml  Output   1700 ml  Net 848.75 ml   Filed Weights    03/18/15 2130  Weight: 58.968 kg (130 lb)    Exam:   General:NAD  Cardiovascular: RRR  Respiratory: CTAB  Abdomen:  Soft, nontender, nondistended, positive bowel sounds.  Musculoskeletal:  No c/c/e  Data Reviewed: Basic Metabolic Panel:  Recent Labs Lab 03/18/15 0237 03/19/15 0415 03/20/15 0503  NA 133* 135 133*  K 3.8 4.2 3.7  CL 100* 107 105  CO2 25 22 21*  GLUCOSE 133* 170* 125*  BUN 33* 24* 30*  CREATININE 1.20* 1.05* 1.20*  CALCIUM 8.6* 7.9* 7.6*  MG  --  2.0  --    Liver Function Tests: No results for input(s): AST, ALT, ALKPHOS, BILITOT, PROT, ALBUMIN in the last 168 hours. No results for input(s): LIPASE, AMYLASE in the last 168 hours. No results for input(s): AMMONIA in the last 168 hours. CBC:  Recent Labs Lab 03/18/15 0237 03/19/15 0415 03/20/15 0503  WBC 9.9 6.9 8.9  NEUTROABS 8.0*  --   --   HGB 11.8* 8.7* 8.2*  HCT 34.1* 25.0* 23.4*  MCV 92.9 91.9 92.5  PLT 165 106* 112*   Cardiac Enzymes: No results for input(s): CKTOTAL, CKMB, CKMBINDEX, TROPONINI in the last 168 hours. BNP (last 3 results) No results for input(s): BNP in the last 8760 hours.  ProBNP (last 3 results) No results for input(s): PROBNP in the last 8760 hours.  CBG: No results for input(s): GLUCAP in the last 168 hours.  No results found for this or any previous visit (from the past 240 hour(s)).   Studies: Pelvis Portable  03/18/2015  CLINICAL DATA:  Right femur fracture EXAM: PORTABLE PELVIS 1-2 VIEWS COMPARISON:  0900 hours FINDINGS: Dynamic compression screw and intra medullary rod  transfix an intertrochanteric proximal right femur fracture. There is a single distal interlocking screw. Anatomic alignment of the larger fracture fragments. No breakage or loosening of the hardware. Osteopenia. A minimally displaced right acetabular fracture is re- demonstrated. IMPRESSION: ORIF intertrochanteric right femur fracture. Stable right acetabular fracture. Electronically  Signed   By: Marybelle Killings M.D.   On: 03/18/2015 19:15   Dg C-arm 1-60 Min-no Report  03/18/2015  CLINICAL DATA: right im nail hip C-ARM 1-60 MINUTES Fluoroscopy was utilized by the requesting physician.  No radiographic interpretation.   Dg Hip Operative Unilat With Pelvis Right  03/18/2015  CLINICAL DATA:  ORIF right proximal femur fracture EXAM: DG C-ARM 1-60 MIN-NO REPORT; OPERATIVE RIGHT HIP WITH PELVIS COMPARISON:  Right hip radiographs from earlier today FINDINGS: Fluoroscopy time 57 seconds. Three spot fluoroscopic nondiagnostic intraoperative radiographs of the right hip demonstrate intra medullary rod in the right proximal femur with interlocking right femoral neck pain and single distal interlocking screw transfixing the intratrochanteric right femur fracture. IMPRESSION: Intraoperative guidance for ORIF right proximal femur fracture. Electronically Signed   By: Ilona Sorrel M.D.   On: 03/18/2015 19:04    Scheduled Meds: . aspirin EC  325 mg Oral BID PC  . cholecalciferol  3,000 Units Oral Daily  . levothyroxine  112 mcg Oral QAC breakfast   Continuous Infusions: . sodium chloride 75 mL/hr at 03/19/15 2353    Principal Problem:   Fracture, intertrochanteric, right femur (HCC) Active Problems:   Anemia   Closed right hip fracture (HCC)   Pertrochanteric fracture of right femur (HCC)   Postoperative anemia due to acute blood loss   Thyroid activity decreased    Time spent: Wisner MD [facility] Pager [phone]. If 7PM-7AM, please contact night-coverage at [email or web address], password [facility] 03/20/2015, 12:01 PM  LOS: 2 days

## 2015-03-20 NOTE — Progress Notes (Signed)
Physical Therapy Treatment Patient Details Name: Bailey [name] MRN: [id number] DOB: November 22, 1926 Today's Date: 03/20/2015    History of Present Illness pt is s/p R IM nail   03/18/15 after fall resulting in R pertrochnateric femur fx    PT Comments    POD # 2 pm session.  Used a bilateral platform EVA walker to advance gait.  Pt required + 3 assist for safety.  Amb 5 feet.  Assisted to [facility] then back to bed.   Follow Up Recommendations  SNF ([facility])     Equipment Recommendations       Recommendations for Other Services       Precautions / Restrictions Precautions Precautions: Fall Restrictions Weight Bearing Restrictions: No RLE Weight Bearing: Weight bearing as tolerated    Mobility  Bed Mobility Overal bed mobility: Needs Assistance Bed Mobility: Sit to Supine     Supine to sit: Max assist;+2 for physical assistance Sit to supine: Total assist;+2 for physical assistance;+2 for safety/equipment   General bed mobility comments: assisted back to bed  Transfers Overall transfer level: Needs assistance Equipment used: Bilateral platform walker (EVA walker) Transfers: Sit to/from Stand Sit to Stand: Mod assist;+2 physical assistance;+2 safety/equipment Stand pivot transfers: Max assist;+2 physical assistance;+2 safety/equipment       General transfer comment: 50% VC's on proper hand placement to increase self rise and control decend.   Ambulation/Gait Ambulation/Gait assistance: Max assist;+2 physical assistance;+2 safety/equipment Ambulation Distance (Feet): 5 Feet Assistive device: Bilateral platform walker (EVA walker) Gait Pattern/deviations: Step-to pattern;Trunk flexed;Decreased stance time - right Gait velocity: decreased   General Gait Details: used EVA walker for increased support and initiate gait.  Required + 3 assist for safety.  50% VC's on proper sequencing and to increase weightshift and LE advancement.  very difficult to advance either LE.   Recliner following for safety.    Stairs            Wheelchair Mobility    Modified Rankin (Stroke Patients Only)       Balance                                    Cognition Arousal/Alertness: Awake/alert Behavior During Therapy: WFL for tasks assessed/performed Overall Cognitive Status: Within Functional Limits for tasks assessed                      Exercises      General Comments        Pertinent Vitals/Pain Pain Assessment: 0-10 Pain Score:  (20/10 stated Pt) Faces Pain Scale: Hurts whole lot Pain Location: R hip Pain Descriptors / Indicators: Discomfort;Grimacing;Tightness Pain Intervention(s): Monitored during session;Premedicated before session;Repositioned;Ice applied    Home Living                      Prior Function            PT Goals (current goals can now be found in the care plan section) Progress towards PT goals: Progressing toward goals    Frequency  Min 3X/week    PT Plan Current plan remains appropriate    Co-evaluation             End of Session Equipment Utilized During Treatment: Gait belt Activity Tolerance: Patient limited by pain;Patient limited by fatigue Patient left: in bed;with call bell/phone within reach;with family/visitor present     Time: 4166-0630 PT  Time Calculation (min) (ACUTE ONLY): 25 min  Charges:  $Gait Training: 8-22 mins $Therapeutic Activity: 8-22 mins                    G Codes:      Rica Koyanagi  PTA [facility]  Acute  Rehab Pager      [phone]

## 2015-03-20 NOTE — Progress Notes (Signed)
Pt says right leg cramps somewhat better.  Instead of them being constant, they are less frequent.  Also administered 0.5 morphine as ordered.

## 2015-03-21 LAB — BASIC METABOLIC PANEL
ANION GAP: 4 — AB (ref 5–15)
BUN: 18 mg/dL (ref 6–20)
CHLORIDE: 108 mmol/L (ref 101–111)
CO2: 23 mmol/L (ref 22–32)
Calcium: 7.1 mg/dL — ABNORMAL LOW (ref 8.9–10.3)
Creatinine, Ser: 0.96 mg/dL (ref 0.44–1.00)
GFR calc Af Amer: >60 mL/min (ref >60)
GFR, EST NON AFRICAN AMERICAN: 52 mL/min — AB (ref >60)
GLUCOSE: 112 mg/dL — AB (ref 65–99)
POTASSIUM: 3.5 mmol/L (ref 3.5–5.1)
SODIUM: 135 mmol/L (ref 135–145)

## 2015-03-21 LAB — CBC
HCT: 21.9 % — ABNORMAL LOW (ref 36.0–46.0)
HEMOGLOBIN: 7.5 g/dL — AB (ref 12.0–15.0)
MCH: 31.9 pg (ref 26.0–34.0)
MCHC: 34.2 g/dL (ref 30.0–36.0)
MCV: 93.2 fL (ref 78.0–100.0)
PLATELETS: 108 10*3/uL — AB (ref 150–400)
RBC: 2.35 MIL/uL — AB (ref 3.87–5.11)
RDW: 12.9 % (ref 11.5–15.5)
WBC: 5.8 10*3/uL (ref 4.0–10.5)

## 2015-03-21 LAB — HEMOGLOBIN AND HEMATOCRIT, BLOOD
HCT: 24.7 % — ABNORMAL LOW (ref 36.0–46.0)
HEMOGLOBIN: 8.3 g/dL — AB (ref 12.0–15.0)

## 2015-03-21 MED ORDER — POTASSIUM CHLORIDE CRYS ER 20 MEQ PO TBCR
40.0000 meq | EXTENDED_RELEASE_TABLET | Freq: Once | ORAL | Status: AC
  Administered 2015-03-21: 40 meq via ORAL
  Filled 2015-03-21: qty 2

## 2015-03-21 MED ORDER — CYANOCOBALAMIN 1000 MCG/ML IJ SOLN
1000.0000 ug | Freq: Once | INTRAMUSCULAR | Status: AC
  Administered 2015-03-21: 1000 ug via INTRAMUSCULAR
  Filled 2015-03-21: qty 1

## 2015-03-21 MED ORDER — SORBITOL 70 % SOLN
30.0000 mL | Freq: Once | Status: AC
  Administered 2015-03-21: 30 mL via ORAL
  Filled 2015-03-21: qty 30

## 2015-03-21 NOTE — Progress Notes (Signed)
[facility] PROGRESS NOTE  Bailey [name] YQM:[id number] DOB: 1926/06/07 DOA: 03/18/2015 PCP: Estill Dooms, MD  Assessment/Plan:  #1 comminuted right peritrochateric femur fracture/ mildly displaced fracture junction of right superior pubic ramus and acetabulum/  Mildly displaced fracture right inferior pubic ramus   Secondary to mechanical fall. Patient status post supplementary fixation right peritrochaneric femur fracture per Dr. Lyla Glassing 03/19/2015. PT/OT. Pain management.  Will likely need skilled nursing facility.Per orthopedics.  #2 hypertension Blood pressure has improved. Saline lock IV fluids. Continue to hold antihypertensive medications.   #3 postoperative acute blood loss anemia/anemia of chronic disease Patient without overt bleeding. Anemia panel consistent with anemia of chronic disease. Hemoglobin was 7.5 today. Repeat H&H this afternoon.Transfusion threshold hemoglobin less than 7.   #4  hypothyroidism TSH = 0.613. Continue home dose Synthroid.  #5  Chronic kidney disease stage II Stable.  #6  Prophylaxis  DVT prophylaxis per orthopdics   Code Status:  full Family Communication:  Updated patient. No family at bedside. Disposition Plan:  Skilled nursing facility when medically stable, hopefully tomorrow.   Consultants:   orthopedics: Dr. Veverly Fells 11/0/2016  Procedures:   supplementary fixation of right  Peritrochanteric femur fracture per Dr. Lyla Glassing 03/19/2015   plain films right fmur 03/18/2015   plain flms of the right hip and pelvis 03/18/2015   CT pelvis 11/02/216  Antibiotics:  None  HPI/Subjective: Patient denies any chest pain. No shortness of breath. Patient c/o constipation.   Objective: Filed Vitals:   03/21/15 0810  BP: 111/58  Pulse: 82  Temp: 98.2 F (36.8 C)  Resp: 16    Intake/Output Summary (Last 24 hours) at 03/21/15 1236 Last data filed at 03/21/15 0842  Gross per 24 hour  Intake   1860 ml  Output   1800 ml   Net     60 ml   Filed Weights   03/18/15 2130  Weight: 58.968 kg (130 lb)    Exam:   General:NAD  Cardiovascular: RRR  Respiratory: CTAB  Abdomen:  Soft, nontender, nondistended, positive bowel sounds.  Musculoskeletal:  No c/c/e  Data Reviewed: Basic Metabolic Panel:  Recent Labs Lab 03/18/15 0237 03/19/15 0415 03/20/15 0503 03/21/15 0425  NA 133* 135 133* 135  K 3.8 4.2 3.7 3.5  CL 100* 107 105 108  CO2 25 22 21* 23  GLUCOSE 133* 170* 125* 112*  BUN 33* 24* 30* 18  CREATININE 1.20* 1.05* 1.20* 0.96  CALCIUM 8.6* 7.9* 7.6* 7.1*  MG  --  2.0  --   --    Liver Function Tests: No results for input(s): AST, ALT, ALKPHOS, BILITOT, PROT, ALBUMIN in the last 168 hours. No results for input(s): LIPASE, AMYLASE in the last 168 hours. No results for input(s): AMMONIA in the last 168 hours. CBC:  Recent Labs Lab 03/18/15 0237 03/19/15 0415 03/20/15 0503 03/21/15 0425  WBC 9.9 6.9 8.9 5.8  NEUTROABS 8.0*  --   --   --   HGB 11.8* 8.7* 8.2* 7.5*  HCT 34.1* 25.0* 23.4* 21.9*  MCV 92.9 91.9 92.5 93.2  PLT 165 106* 112* 108*   Cardiac Enzymes: No results for input(s): CKTOTAL, CKMB, CKMBINDEX, TROPONINI in the last 168 hours. BNP (last 3 results) No results for input(s): BNP in the last 8760 hours.  ProBNP (last 3 results) No results for input(s): PROBNP in the last 8760 hours.  CBG: No results for input(s): GLUCAP in the last 168 hours.  No results found for this or any previous visit (  from the past 240 hour(s)).   Studies: No results found.  Scheduled Meds: . aspirin EC  325 mg Oral BID PC  . cholecalciferol  3,000 Units Oral Daily  . levothyroxine  112 mcg Oral QAC breakfast   Continuous Infusions:    Principal Problem:   Fracture, intertrochanteric, right femur (HCC) Active Problems:   Anemia   Closed right hip fracture (HCC)   Pertrochanteric fracture of right femur (HCC)   Postoperative anemia due to acute blood loss   Thyroid activity  decreased    Time spent: [place] MD [facility] Pager [phone]. If 7PM-7AM, please contact night-coverage at [email or web address], password [facility] 03/21/2015, 12:36 PM  LOS: 3 days

## 2015-03-21 NOTE — Progress Notes (Signed)
Bailey [name]  MRN: [id number] DOB/Age: [birth year] 81 y.o. Physician: [place] Procedure: Procedure(s) (LRB): INTRAMEDULLARY (IM) NAIL FEMORAL (Right)     Subjective: Pleasant lady with expected amount of post op pain with therapies. No specific complaints otherwise  Vital Signs Temp:  [97.6 F (36.4 C)-98.3 F (36.8 C)] 97.6 F (36.4 C) (11/05 0547) Pulse Rate:  [77-84] 77 (11/05 0547) Resp:  [18] 18 (11/05 0547) BP: (122-167)/(71-74) 132/71 mmHg (11/05 0547) SpO2:  [96 %-99 %] 96 % (11/05 0547)  Lab Results  Recent Labs  03/20/15 0503 03/21/15 0425  WBC 8.9 5.8  HGB 8.2* 7.5*  HCT 23.4* 21.9*  PLT 112* 108*   BMET  Recent Labs  03/20/15 0503 03/21/15 0425  NA 133* 135  K 3.7 3.5  CL 105 108  CO2 21* 23  GLUCOSE 125* 112*  BUN 30* 18  CREATININE 1.20* 0.96  CALCIUM 7.6* 7.1*   No results found for: INR   Exam Right hip dressing dry Thigh and calf soft NVI        Plan Cont current plan Plan skilled at [facility] for [place] 03/21/2015, 8:08 AM Contact # [phone]

## 2015-03-22 DIAGNOSIS — D649 Anemia, unspecified: Secondary | ICD-10-CM

## 2015-03-22 DIAGNOSIS — R112 Nausea with vomiting, unspecified: Secondary | ICD-10-CM | POA: Diagnosis not present

## 2015-03-22 LAB — BASIC METABOLIC PANEL
ANION GAP: 8 (ref 5–15)
BUN: 15 mg/dL (ref 6–20)
CALCIUM: 7.2 mg/dL — AB (ref 8.9–10.3)
CHLORIDE: 104 mmol/L (ref 101–111)
CO2: 22 mmol/L (ref 22–32)
Creatinine, Ser: 0.98 mg/dL (ref 0.44–1.00)
GFR calc Af Amer: 59 mL/min — ABNORMAL LOW (ref >60)
GFR calc non Af Amer: 51 mL/min — ABNORMAL LOW (ref >60)
Glucose, Bld: 109 mg/dL — ABNORMAL HIGH (ref 65–99)
POTASSIUM: 3.6 mmol/L (ref 3.5–5.1)
Sodium: 134 mmol/L — ABNORMAL LOW (ref 135–145)

## 2015-03-22 LAB — CBC
HEMATOCRIT: 22.6 % — AB (ref 36.0–46.0)
Hemoglobin: 7.8 g/dL — ABNORMAL LOW (ref 12.0–15.0)
MCH: 32.5 pg (ref 26.0–34.0)
MCHC: 34.5 g/dL (ref 30.0–36.0)
MCV: 94.2 fL (ref 78.0–100.0)
Platelets: 139 10*3/uL — ABNORMAL LOW (ref 150–400)
RBC: 2.4 MIL/uL — AB (ref 3.87–5.11)
RDW: 12.8 % (ref 11.5–15.5)
WBC: 6.8 10*3/uL (ref 4.0–10.5)

## 2015-03-22 LAB — URINALYSIS, ROUTINE W REFLEX MICROSCOPIC
Bilirubin Urine: NEGATIVE
GLUCOSE, UA: NEGATIVE mg/dL
Hgb urine dipstick: NEGATIVE
Ketones, ur: NEGATIVE mg/dL
LEUKOCYTES UA: NEGATIVE
NITRITE: NEGATIVE
PH: 7 (ref 5.0–8.0)
Protein, ur: NEGATIVE mg/dL
SPECIFIC GRAVITY, URINE: 1.007 (ref 1.005–1.030)
Urobilinogen, UA: 1 mg/dL (ref 0.0–1.0)

## 2015-03-22 MED ORDER — ATENOLOL 12.5 MG HALF TABLET
12.5000 mg | ORAL_TABLET | Freq: Every day | ORAL | Status: DC
  Administered 2015-03-22 – 2015-03-23 (×2): 12.5 mg via ORAL
  Filled 2015-03-22 (×2): qty 1

## 2015-03-22 MED ORDER — PANTOPRAZOLE SODIUM 40 MG IV SOLR
40.0000 mg | INTRAVENOUS | Status: DC
  Administered 2015-03-22 – 2015-03-23 (×2): 40 mg via INTRAVENOUS
  Filled 2015-03-22 (×2): qty 40

## 2015-03-22 MED ORDER — PROCHLORPERAZINE EDISYLATE 5 MG/ML IJ SOLN
10.0000 mg | Freq: Four times a day (QID) | INTRAMUSCULAR | Status: AC
  Administered 2015-03-22 – 2015-03-23 (×4): 10 mg via INTRAVENOUS
  Filled 2015-03-22 (×4): qty 2

## 2015-03-22 MED ORDER — ONDANSETRON HCL 40 MG/20ML IJ SOLN
8.0000 mg | Freq: Four times a day (QID) | INTRAMUSCULAR | Status: DC | PRN
  Filled 2015-03-22: qty 4

## 2015-03-22 MED ORDER — MAGNESIUM HYDROXIDE 400 MG/5ML PO SUSP: 30.0000 mL | Freq: Every day | ORAL | Status: DC | PRN

## 2015-03-22 MED ORDER — GI COCKTAIL ~~LOC~~
30.0000 mL | Freq: Three times a day (TID) | ORAL | Status: DC | PRN
  Administered 2015-03-22: 30 mL via ORAL
  Filled 2015-03-22 (×2): qty 30

## 2015-03-22 MED ORDER — PROCHLORPERAZINE EDISYLATE 5 MG/ML IJ SOLN: 10.0000 mg | Freq: Four times a day (QID) | INTRAMUSCULAR | Status: DC | PRN

## 2015-03-22 NOTE — Clinical Social Work Note (Signed)
CSW spoke with Narda Rutherford at  [facility] who was inquiring whether pt was ready for d/c today.  CSW spoke with MD who stated that pt was not ready today.  Information was provided to [facility].  Dede Query, LCSW [place] Worker - Weekend Coverage cell #: [phone]

## 2015-03-22 NOTE — Progress Notes (Signed)
[facility] PROGRESS NOTE  Bailey [name] YQM:[id number] DOB: 1926/11/25 DOA: 03/18/2015 PCP: Estill Dooms, MD  Assessment/Plan:  #1 comminuted right peritrochateric femur fracture/ mildly displaced fracture junction of right superior pubic ramus and acetabulum/  Mildly displaced fracture right inferior pubic ramus   Secondary to mechanical fall. Patient status post supplementary fixation right peritrochaneric femur fracture per Dr. Lyla Glassing 03/19/2015. PT/OT. Pain management.  Will likely need skilled nursing facility.Per orthopedics.  #2 hypertension Blood pressure has improved. Saline lock IV fluids. Start low dose atenolol.  #3 postoperative acute blood loss anemia/anemia of chronic disease Patient without overt bleeding. Anemia panel consistent with anemia of chronic disease. Hemoglobin was 7.8 and seems to be stabilizing. Transfusion threshold hemoglobin less than 7.   #4  hypothyroidism TSH = 0.613. Continue home dose Synthroid.  #5  Chronic kidney disease stage II Stable.  #6 Nausea ??etiology. Patient having BMs. Patient afebrile. Nl WBC. No respiratory symptoms. Check UA with c and s. D/C Zofran and reglan. Compazine scheduled x 24 hours then PRN.  #7  Prophylaxis  DVT prophylaxis per orthopdics   Code Status:  full Family Communication:  Updated patient. No family at bedside. Disposition Plan:  Skilled nursing facility when medically stable, with resolution of nausea, hopefully tomorrow.   Consultants:   orthopedics: Dr. Veverly Fells 11/0/2016  Procedures:   supplementary fixation of right  Peritrochanteric femur fracture per Dr. Lyla Glassing 03/19/2015   plain films right fmur 03/18/2015   plain flms of the right hip and pelvis 03/18/2015   CT pelvis 11/02/216  Antibiotics:  None  HPI/Subjective: Patient denies any chest pain. No shortness of breath. Patient with BM yesterday. Patient c/o nausea and episode of emesis yesterday evening per  patient.  Objective: Filed Vitals:   03/22/15 0600  BP: 121/74  Pulse: 91  Temp: 98.5 F (36.9 C)  Resp: 16    Intake/Output Summary (Last 24 hours) at 03/22/15 0956 Last data filed at 03/22/15 0739  Gross per 24 hour  Intake    480 ml  Output   1703 ml  Net  -1223 ml   Filed Weights   03/18/15 2130  Weight: 58.968 kg (130 lb)    Exam:   General:NAD  Cardiovascular: RRR  Respiratory: CTAB  Abdomen:  Soft, nontender, nondistended, positive bowel sounds.  Musculoskeletal:  No c/c/e  Data Reviewed: Basic Metabolic Panel:  Recent Labs Lab 03/18/15 0237 03/19/15 0415 03/20/15 0503 03/21/15 0425 03/22/15 0444  NA 133* 135 133* 135 134*  K 3.8 4.2 3.7 3.5 3.6  CL 100* 107 105 108 104  CO2 25 22 21* 23 22  GLUCOSE 133* 170* 125* 112* 109*  BUN 33* 24* 30* 18 15  CREATININE 1.20* 1.05* 1.20* 0.96 0.98  CALCIUM 8.6* 7.9* 7.6* 7.1* 7.2*  MG  --  2.0  --   --   --    Liver Function Tests: No results for input(s): AST, ALT, ALKPHOS, BILITOT, PROT, ALBUMIN in the last 168 hours. No results for input(s): LIPASE, AMYLASE in the last 168 hours. No results for input(s): AMMONIA in the last 168 hours. CBC:  Recent Labs Lab 03/18/15 0237 03/19/15 0415 03/20/15 0503 03/21/15 0425 03/21/15 1314 03/22/15 0444  WBC 9.9 6.9 8.9 5.8  --  6.8  NEUTROABS 8.0*  --   --   --   --   --   HGB 11.8* 8.7* 8.2* 7.5* 8.3* 7.8*  HCT 34.1* 25.0* 23.4* 21.9* 24.7* 22.6*  MCV 92.9 91.9 92.5 93.2  --  94.2  PLT 165 106* 112* 108*  --  139*   Cardiac Enzymes: No results for input(s): CKTOTAL, CKMB, CKMBINDEX, TROPONINI in the last 168 hours. BNP (last 3 results) No results for input(s): BNP in the last 8760 hours.  ProBNP (last 3 results) No results for input(s): PROBNP in the last 8760 hours.  CBG: No results for input(s): GLUCAP in the last 168 hours.  No results found for this or any previous visit (from the past 240 hour(s)).   Studies: No results  found.  Scheduled Meds: . aspirin EC  325 mg Oral BID PC  . atenolol  12.5 mg Oral Daily  . cholecalciferol  3,000 Units Oral Daily  . levothyroxine  112 mcg Oral QAC breakfast  . pantoprazole (PROTONIX) IV  40 mg Intravenous Q24H   Continuous Infusions:    Principal Problem:   Fracture, intertrochanteric, right femur (HCC) Active Problems:   Anemia   Closed right hip fracture (HCC)   Pertrochanteric fracture of right femur (HCC)   Postoperative anemia due to acute blood loss   Thyroid activity decreased   Nausea with vomiting    Time spent: [place] MD [facility] Pager [phone]. If 7PM-7AM, please contact night-coverage at [email or web address], password [facility] 03/22/2015, 9:56 AM  LOS: 4 days

## 2015-03-22 NOTE — Progress Notes (Signed)
Subjective: 4 Days Post-Op Procedure(s) (LRB): INTRAMEDULLARY (IM) NAIL FEMORAL (Right) Patient reports pain as mild to right hip.  Reports nausea, but no vomiting. Limited PO's. Positive flatus. Denies SOB, CP, or calf pain.   Objective: Vital signs in last 24 hours: Temp:  [98.2 F (36.8 C)-98.5 F (36.9 C)] 98.5 F (36.9 C) (11/06 0600) Pulse Rate:  [91-94] 91 (11/06 0600) Resp:  [16-17] 16 (11/06 0600) BP: (121-158)/(74-75) 121/74 mmHg (11/06 0600) SpO2:  [97 %-100 %] 100 % (11/06 0600)  Intake/Output from previous day: 11/05 0701 - 11/06 0700 In: 480 [P.O.:480] Out: 1504 [Urine:1500; Emesis/NG output:3; Stool:1] Intake/Output this shift:     Recent Labs  03/20/15 0503 03/21/15 0425 03/21/15 1314 03/22/15 0444  HGB 8.2* 7.5* 8.3* 7.8*    Recent Labs  03/21/15 0425 03/21/15 1314 03/22/15 0444  WBC 5.8  --  6.8  RBC 2.35*  --  2.40*  HCT 21.9* 24.7* 22.6*  PLT 108*  --  139*    Recent Labs  03/21/15 0425 03/22/15 0444  NA 135 134*  K 3.5 3.6  CL 108 104  CO2 23 22  BUN 18 15  CREATININE 0.96 0.98  GLUCOSE 112* 109*  CALCIUM 7.1* 7.2*   No results for input(s): LABPT, INR in the last 72 hours.  Well nourished. Alert and oriented x3. RRR, Lungs clear, BS x4. Abdomen soft and non tender. Right Calf soft and non tender. Right hip dressing C/D/I. No DVT signs. Compartment soft. No signs of infection.  Right LE grossly neurovascular intact. Plantar and dorsi flexion intact.  Assessment/Plan: 4 Days Post-Op Procedure(s) (LRB): INTRAMEDULLARY (IM) NAIL FEMORAL (Right) Up with PT Plan d/c to [facility] Continue current care Medicine following Nausea: Monitor Medications as ordered.  Dezaree Tracey L 03/22/2015, 8:37 AM

## 2015-03-23 DIAGNOSIS — S32501D Unspecified fracture of right pubis, subsequent encounter for fracture with routine healing: Secondary | ICD-10-CM | POA: Diagnosis not present

## 2015-03-23 DIAGNOSIS — S32401A Unspecified fracture of right acetabulum, initial encounter for closed fracture: Secondary | ICD-10-CM | POA: Diagnosis not present

## 2015-03-23 DIAGNOSIS — N390 Urinary tract infection, site not specified: Secondary | ICD-10-CM | POA: Diagnosis not present

## 2015-03-23 DIAGNOSIS — E1122 Type 2 diabetes mellitus with diabetic chronic kidney disease: Secondary | ICD-10-CM | POA: Diagnosis not present

## 2015-03-23 DIAGNOSIS — N182 Chronic kidney disease, stage 2 (mild): Secondary | ICD-10-CM | POA: Diagnosis not present

## 2015-03-23 DIAGNOSIS — R2681 Unsteadiness on feet: Secondary | ICD-10-CM | POA: Diagnosis not present

## 2015-03-23 DIAGNOSIS — S32591A Other specified fracture of right pubis, initial encounter for closed fracture: Secondary | ICD-10-CM | POA: Diagnosis not present

## 2015-03-23 DIAGNOSIS — E039 Hypothyroidism, unspecified: Secondary | ICD-10-CM | POA: Diagnosis not present

## 2015-03-23 DIAGNOSIS — G4700 Insomnia, unspecified: Secondary | ICD-10-CM | POA: Diagnosis not present

## 2015-03-23 DIAGNOSIS — M6281 Muscle weakness (generalized): Secondary | ICD-10-CM | POA: Diagnosis not present

## 2015-03-23 DIAGNOSIS — G894 Chronic pain syndrome: Secondary | ICD-10-CM | POA: Diagnosis not present

## 2015-03-23 DIAGNOSIS — K5900 Constipation, unspecified: Secondary | ICD-10-CM | POA: Diagnosis not present

## 2015-03-23 DIAGNOSIS — S72141A Displaced intertrochanteric fracture of right femur, initial encounter for closed fracture: Secondary | ICD-10-CM | POA: Diagnosis not present

## 2015-03-23 DIAGNOSIS — R112 Nausea with vomiting, unspecified: Secondary | ICD-10-CM | POA: Diagnosis not present

## 2015-03-23 DIAGNOSIS — M169 Osteoarthritis of hip, unspecified: Secondary | ICD-10-CM | POA: Diagnosis not present

## 2015-03-23 DIAGNOSIS — M150 Primary generalized (osteo)arthritis: Secondary | ICD-10-CM | POA: Diagnosis not present

## 2015-03-23 DIAGNOSIS — D62 Acute posthemorrhagic anemia: Secondary | ICD-10-CM | POA: Diagnosis not present

## 2015-03-23 DIAGNOSIS — S32501S Unspecified fracture of right pubis, sequela: Secondary | ICD-10-CM | POA: Diagnosis not present

## 2015-03-23 DIAGNOSIS — M810 Age-related osteoporosis without current pathological fracture: Secondary | ICD-10-CM | POA: Diagnosis not present

## 2015-03-23 DIAGNOSIS — M25551 Pain in right hip: Secondary | ICD-10-CM | POA: Diagnosis not present

## 2015-03-23 DIAGNOSIS — S72101D Unspecified trochanteric fracture of right femur, subsequent encounter for closed fracture with routine healing: Secondary | ICD-10-CM | POA: Diagnosis not present

## 2015-03-23 DIAGNOSIS — D638 Anemia in other chronic diseases classified elsewhere: Secondary | ICD-10-CM | POA: Diagnosis not present

## 2015-03-23 DIAGNOSIS — M62838 Other muscle spasm: Secondary | ICD-10-CM | POA: Diagnosis not present

## 2015-03-23 DIAGNOSIS — Z4789 Encounter for other orthopedic aftercare: Secondary | ICD-10-CM | POA: Diagnosis not present

## 2015-03-23 DIAGNOSIS — M21251 Flexion deformity, right hip: Secondary | ICD-10-CM | POA: Diagnosis not present

## 2015-03-23 DIAGNOSIS — E46 Unspecified protein-calorie malnutrition: Secondary | ICD-10-CM | POA: Diagnosis not present

## 2015-03-23 DIAGNOSIS — K219 Gastro-esophageal reflux disease without esophagitis: Secondary | ICD-10-CM | POA: Diagnosis not present

## 2015-03-23 DIAGNOSIS — I10 Essential (primary) hypertension: Secondary | ICD-10-CM | POA: Diagnosis not present

## 2015-03-23 DIAGNOSIS — R262 Difficulty in walking, not elsewhere classified: Secondary | ICD-10-CM | POA: Diagnosis not present

## 2015-03-23 LAB — BASIC METABOLIC PANEL
ANION GAP: 6 (ref 5–15)
BUN: 17 mg/dL (ref 6–20)
CHLORIDE: 105 mmol/L (ref 101–111)
CO2: 24 mmol/L (ref 22–32)
Calcium: 7.3 mg/dL — ABNORMAL LOW (ref 8.9–10.3)
Creatinine, Ser: 1.16 mg/dL — ABNORMAL HIGH (ref 0.44–1.00)
GFR, EST AFRICAN AMERICAN: 48 mL/min — AB (ref >60)
GFR, EST NON AFRICAN AMERICAN: 41 mL/min — AB (ref >60)
Glucose, Bld: 104 mg/dL — ABNORMAL HIGH (ref 65–99)
POTASSIUM: 3.5 mmol/L (ref 3.5–5.1)
SODIUM: 135 mmol/L (ref 135–145)

## 2015-03-23 LAB — CBC
HCT: 22.2 % — ABNORMAL LOW (ref 36.0–46.0)
HEMOGLOBIN: 7.5 g/dL — AB (ref 12.0–15.0)
MCH: 31.6 pg (ref 26.0–34.0)
MCHC: 33.8 g/dL (ref 30.0–36.0)
MCV: 93.7 fL (ref 78.0–100.0)
PLATELETS: 156 10*3/uL (ref 150–400)
RBC: 2.37 MIL/uL — AB (ref 3.87–5.11)
RDW: 12.9 % (ref 11.5–15.5)
WBC: 5.7 10*3/uL (ref 4.0–10.5)

## 2015-03-23 LAB — HEMOGLOBIN AND HEMATOCRIT, BLOOD
HEMATOCRIT: 28.3 % — AB (ref 36.0–46.0)
HEMOGLOBIN: 9.8 g/dL — AB (ref 12.0–15.0)

## 2015-03-23 LAB — URINE CULTURE

## 2015-03-23 LAB — PREPARE RBC (CROSSMATCH)

## 2015-03-23 LAB — ABO/RH: ABO/RH(D): A NEG

## 2015-03-23 MED ORDER — PANTOPRAZOLE SODIUM 40 MG PO TBEC: 40.0000 mg | DELAYED_RELEASE_TABLET | Freq: Every day | ORAL | Status: DC

## 2015-03-23 MED ORDER — DIPHENHYDRAMINE HCL 25 MG PO CAPS
25.0000 mg | ORAL_CAPSULE | Freq: Once | ORAL | Status: AC
Start: 2015-03-23 — End: 2015-03-23
  Administered 2015-03-23: 25 mg via ORAL
  Filled 2015-03-23: qty 1

## 2015-03-23 MED ORDER — LIDOCAINE 5 % EX PTCH: 1.0000 | MEDICATED_PATCH | CUTANEOUS | Status: DC

## 2015-03-23 MED ORDER — ASPIRIN 325 MG PO TBEC: 325.0000 mg | DELAYED_RELEASE_TABLET | Freq: Two times a day (BID) | ORAL | Status: DC

## 2015-03-23 MED ORDER — PROCHLORPERAZINE MALEATE 10 MG PO TABS: 10.0000 mg | ORAL_TABLET | Freq: Four times a day (QID) | ORAL | Status: DC | PRN

## 2015-03-23 MED ORDER — ACETAMINOPHEN 325 MG PO TABS
650.0000 mg | ORAL_TABLET | Freq: Once | ORAL | Status: AC
  Administered 2015-03-23: 650 mg via ORAL
  Filled 2015-03-23: qty 2

## 2015-03-23 MED ORDER — OXYCODONE-ACETAMINOPHEN 5-325 MG PO TABS: ORAL_TABLET | ORAL | Status: DC

## 2015-03-23 MED ORDER — SODIUM CHLORIDE 0.9 % IV SOLN
Freq: Once | INTRAVENOUS | Status: AC
  Administered 2015-03-23: 08:00:00 via INTRAVENOUS

## 2015-03-23 NOTE — Progress Notes (Signed)
Key Points: Use following P&T approved IV to PO antibiotic change policy.  Description contains the criteria that are approved Note: Policy Excludes:  Esophagectomy patientsPHARMACIST - PHYSICIAN COMMUNICATION DR: Grandville Silos CONCERNING: IV to Oral Route Change Policy  RECOMMENDATION: This patient is receiving protonix by the intravenous route.  Based on criteria approved by the Pharmacy and Therapeutics Committee, the intravenous medication(s) is/are being converted to the equivalent oral dose form(s).   DESCRIPTION: These criteria include:  The patient is eating (either orally or via tube) and/or has been taking other orally administered medications for a least 24 hours  The patient has no evidence of active gastrointestinal bleeding or impaired GI absorption (gastrectomy, short bowel, patient on TNA or NPO).  If you have questions about this conversion, please contact the Pharmacy Department  []   [phone] )  [facility] []   [phone] )  [facility] []   [phone] )  [facility] []   [phone] )  [facility] [x]   [phone] )  [place], [facility] 03/23/2015 12:49 PM

## 2015-03-23 NOTE — Discharge Summary (Signed)
Physician Discharge Summary  Bailey [name] DGL:[id number] DOB: [birth year] DOA: 03/18/2015  PCP: Estill Dooms, MD  Admit date: 03/18/2015 Discharge date: 03/23/2015  Time spent: 65 minutes  Recommendations for Outpatient Follow-up:  1. Follow-up with Dr. Lyla Glassing in 2 weeks. 2. Follow-up with M.D. at skilled nursing facility. Patient will need a CBC checked in 1 week to follow-up on H&H. Most of patient's antihypertensive medications were held/discontinued on discharge and patient discharged only on atenolol. If further blood pressure control is needed may resume patient's prior antihypertensives of norvasc and avapro.  Discharge Diagnoses:  Principal Problem:   Fracture, intertrochanteric, right femur ([place]) Active Problems:   Anemia   Closed right hip fracture (HCC)   Pertrochanteric fracture of right femur (HCC)   Postoperative anemia due to acute blood loss   Thyroid activity decreased   Nausea with vomiting   Discharge Condition: Stable and improved  Diet recommendation: regular  Filed Weights   03/18/15 2130  Weight: 58.968 kg (130 lb)    History of present illness:  Per Dr Betsey Amen is a 81 y.o. female who suffered a mechanical fall this evening / morning. She was getting out of bed to use the bathroom, when she tripped and landed on R hip. Immediate pain and inability to ambulate as well as deformity of the R leg. Patient brought in to ED.   Hospital Course:  #1 comminuted right peritrochateric femur fracture/ mildly displaced fracture junction of right superior pubic ramus and acetabulum/ Mildly displaced fracture right inferior pubic ramus  Secondary to mechanical fall. Patient status post supplementary fixation right peritrochaneric femur fracture per Dr. Lyla Glassing 03/19/2015. PT/OT. Pain management. Patient with discharge to skilled nursing facility and will follow-up with orthjopedics in 2 weeks.   #2 hypertension Blood pressure during the  hospitalization was borderline and patient's antihypertensive medications were held. Patient's atenolol was subsequently resumed. Patient be discharged on home regimen of atenolol. Patient's other antihypertensive medications may be resumed if needed.   #3 postoperative acute blood loss anemia/anemia of chronic disease Patient without overt bleeding. Anemia panel consistent with anemia of chronic disease. Hemoglobin trended down from 11.8 on admission to 7.5. Patient was transfused 1 unit PRBCs. No overt blood loss. Outpatient follow up.  #4 hypothyroidism TSH = 0.613. Continued on home dose Synthroid.  #5 Chronic kidney disease stage II Stable.  #6 Nausea ??etiology. Patient having BMs. Patient afebrile. Nl WBC. No respiratory symptoms. UA with c and s is negative. . Patient was placed on scheduled compazine x 1 day, with resolution of nausea.    Procedures:  supplementary fixation of right Peritrochanteric femur fracture per Dr. Lyla Glassing 03/19/2015  plain films right fmur 03/18/2015  plain flms of the right hip and pelvis 03/18/2015  CT pelvis 11/02/216  1 UNIT PRBCs 03/23/2015  Consultations:  orthopedics: Dr. Veverly Fells 11/0/2016    Discharge Exam: Filed Vitals:   03/23/15 1415  BP: 146/68  Pulse: 89  Temp: 97.7 F (36.5 C)  Resp: 16    General: NAD Cardiovascular: RRR Respiratory: CTAB  Discharge Instructions   Discharge Instructions    Diet general    Complete by:  As directed      Increase activity slowly    Complete by:  As directed   WBAT          Current Discharge Medication List    START taking these medications   Details  aspirin EC 325 MG EC tablet Take 1 tablet (325 mg total) by  mouth 2 (two) times daily after a meal. Qty: 60 tablet, Refills: 0    pantoprazole (PROTONIX) 40 MG tablet Take 1 tablet (40 mg total) by mouth daily. Qty: 30 tablet, Refills: 0    prochlorperazine (COMPAZINE) 10 MG tablet Take 1 tablet (10 mg total) by mouth  every 6 (six) hours as needed for nausea or vomiting. Qty: 20 tablet, Refills: 0      CONTINUE these medications which have CHANGED   Details  lidocaine (LIDODERM) 5 % Place 1-3 patches onto the skin daily. Apply in AM and remove in PM. Remove & Discard patch within 12 hours or as directed by MD Qty: 30 patch, Refills: 0    oxyCODONE-acetaminophen (PERCOCET/ROXICET) 5-325 MG tablet Take one tablet every 4 hours as needed to control pain Qty: 20 tablet, Refills: 0      CONTINUE these medications which have NOT CHANGED   Details  atenolol (TENORMIN) 25 MG tablet Take one tablet by mouth once daily for blood pressure Qty: 90 tablet, Refills: 2    cholecalciferol (VITAMIN D) 1000 UNITS tablet Take 3,000 Units by mouth daily.    Cyanocobalamin (VITAMIN B-12 CR PO) Take 1 tablet by mouth daily.     denosumab (PROLIA) 60 MG/ML SOLN injection Inject 60 mg into the skin every 6 (six) months. Administer in upper arm, thigh, or abdomen    diclofenac (VOLTAREN) 50 MG EC tablet Take one tablet by mouth twice daily to help arthritis pain Qty: 60 tablet, Refills: 5    levothyroxine (SYNTHROID, LEVOTHROID) 112 MCG tablet Take one tablet by mouth once daily for thyroid supplement Qty: 30 tablet, Refills: 3    zolpidem (AMBIEN) 10 MG tablet Take one tablet by mouth by mouth at bedtime for sleep Qty: 30 tablet, Refills: 0      STOP taking these medications     amLODipine (NORVASC) 5 MG tablet      aspirin 81 MG tablet      irbesartan (AVAPRO) 300 MG tablet        Allergies  Allergen Reactions  . Aspirin     Drop in body temp with large quantities, can tolerate low doses of aspirin  . Penicillins Swelling   Follow-up Information    Follow up with Swinteck, Horald Pollen, MD. Schedule an appointment as soon as possible for a visit in 2 weeks.   Specialty:  Orthopedic Surgery   Why:  For wound re-check   Contact information:   [address]. [place]  [address] [phone]       Please follow up.   Why:  f/u with MD at SNF       The results of significant diagnostics from this hospitalization (including imaging, microbiology, ancillary and laboratory) are listed below for reference.    Significant Diagnostic Studies: Ct Pelvis Wo Contrast  03/18/2015  CLINICAL DATA:  Acute pelvic pain after fall at home last night. EXAM: CT PELVIS WITHOUT CONTRAST TECHNIQUE: Multidetector CT imaging of the pelvis was performed following the standard protocol without intravenous contrast. COMPARISON:  None. FINDINGS: Status post surgical internal fixation of old proximal left femoral fracture, with intra medullary rod fixation of the left femoral shaft. Moderately comminuted and displaced fracture is seen involving the intertrochanteric region of the proximal right femur. Mildly displaced fracture is noted at the junction of the right superior pubic ramus and acetabulum. Mildly displaced fracture is seen involving the right inferior pubic ramus. The sacrum appears normal. Sacroiliac joints appear normal. Atherosclerosis  of abdominal aorta is noted. Urinary bladder is decompressed secondary to Foley catheter. IMPRESSION: Moderately comminuted and displaced fracture involving the intertrochanteric region of the proximal right femur. Mildly displaced fracture is noted at the junction of the right superior pubic ramus and acetabulum. Mildly displaced fracture is seen involving the right inferior pubic ramus. Electronically Signed   By: Marijo Conception, M.D.   On: 03/18/2015 10:59   Pelvis Portable  03/18/2015  CLINICAL DATA:  Right femur fracture EXAM: PORTABLE PELVIS 1-2 VIEWS COMPARISON:  0900 hours FINDINGS: Dynamic compression screw and intra medullary rod transfix an intertrochanteric proximal right femur fracture. There is a single distal interlocking screw. Anatomic alignment of the larger fracture fragments. No breakage or loosening of the hardware. Osteopenia. A  minimally displaced right acetabular fracture is re- demonstrated. IMPRESSION: ORIF intertrochanteric right femur fracture. Stable right acetabular fracture. Electronically Signed   By: Marybelle Killings M.D.   On: 03/18/2015 19:15   Ct Hip Right Wo Contrast  03/18/2015  CLINICAL DATA:  Right hip fracture . EXAM: CT OF THE RIGHT HIP WITHOUT CONTRAST TECHNIQUE: Multidetector CT imaging of the right hip was performed according to the standard protocol. Multiplanar CT image reconstructions were also generated. COMPARISON:  Radiographs 03/18/2015 FINDINGS: There is an intertrochanteric right hip fracture with varus angulation and mild impaction. There is no dislocation. There is a mildly comminuted inferior right pubic ramus fracture. There is a right superior ramus fracture extending into the anterior acetabular column. Central and posterior acetabulum intact. Superior acetabulum intact. Sacrum and sacroiliac joint not included within the field of view. Pubic symphysis appears intact. No large hematoma IMPRESSION: Intertrochanteric right hip fracture with varus angulation and mild impaction. Superior and inferior pubic ramus fractures on the right, with minimally displaced anterior acetabular column fracture. Electronically Signed   By: Andreas Newport M.D.   On: 03/18/2015 05:19   Dg C-arm 1-60 Min-no Report  03/18/2015  CLINICAL DATA: right im nail hip C-ARM 1-60 MINUTES Fluoroscopy was utilized by the requesting physician.  No radiographic interpretation.   Dg Hip Operative Unilat With Pelvis Right  03/18/2015  CLINICAL DATA:  ORIF right proximal femur fracture EXAM: DG C-ARM 1-60 MIN-NO REPORT; OPERATIVE RIGHT HIP WITH PELVIS COMPARISON:  Right hip radiographs from earlier today FINDINGS: Fluoroscopy time 57 seconds. Three spot fluoroscopic nondiagnostic intraoperative radiographs of the right hip demonstrate intra medullary rod in the right proximal femur with interlocking right femoral neck pain and single  distal interlocking screw transfixing the intratrochanteric right femur fracture. IMPRESSION: Intraoperative guidance for ORIF right proximal femur fracture. Electronically Signed   By: Ilona Sorrel M.D.   On: 03/18/2015 19:04   Dg Hip Unilat  With Pelvis 2-3 Views Right  03/18/2015  CLINICAL DATA:  Patient fell wall walking to the bathroom. Obvious deformity to the right hip. EXAM: DG HIP (WITH OR WITHOUT PELVIS) 2-3V RIGHT COMPARISON:  None. FINDINGS: Acute comminuted inter trochanteric fracture of the right hip with varus angulation of the fracture fragments and mild impaction. Acute fracture of the innominate bone with displacement. Acute fracture of the right inferior pubic ramus. No dislocation of the hip joint. No focal bone lesions identified. Vascular calcifications. IMPRESSION: Acute comminuted inter trochanteric fracture of the right hip with varus angulation and mild impaction. Fractures of the right inferior pubic ramus and innominate bone. Electronically Signed   By: Lucienne Capers M.D.   On: 03/18/2015 02:27   Dg Femur, Min 2 Views Right  03/18/2015  CLINICAL DATA:  [place] this morning getting out of bed to use the bathroom, tripped and landed on RIGHT hip, pain and inability to ambulate since, deformity EXAM: RIGHT FEMUR 2 VIEWS COMPARISON:  RIGHT hip radiographs 02/15/2015 FINDINGS: Osseous demineralization. Displaced intertrochanteric fracture RIGHT femur with varus angulation. No dislocation. Additionally identified displaced fractures of RIGHT superior and inferior pubic rami. Remainder of RIGHT femur appears intact. Knee joint alignment grossly normal. Extensive atherosclerotic calcifications. IMPRESSION: Displaced angulated intertrochanteric fracture RIGHT femur. Displaced fractures RIGHT superior and inferior pubic rami. Osseous demineralization. Electronically Signed   By: Lavonia Dana M.D.   On: 03/18/2015 10:37    Microbiology: Recent Results (from the past 240 hour(s))  Urine  culture     Status: None (Preliminary result)   Collection Time: 03/22/15 12:00 PM  Result Value Ref Range Status   Specimen Description URINE, CLEAN CATCH  Final   Special Requests NONE  Final   Culture   Final    CULTURE REINCUBATED FOR BETTER GROWTH Performed at [facility]    Report Status PENDING  Incomplete     Labs: Basic Metabolic Panel:  Recent Labs Lab 03/19/15 0415 03/20/15 0503 03/21/15 0425 03/22/15 0444 03/23/15 0423  NA 135 133* 135 134* 135  K 4.2 3.7 3.5 3.6 3.5  CL 107 105 108 104 105  CO2 22 21* 23 22 24   GLUCOSE 170* 125* 112* 109* 104*  BUN 24* 30* 18 15 17   CREATININE 1.05* 1.20* 0.96 0.98 1.16*  CALCIUM 7.9* 7.6* 7.1* 7.2* 7.3*  MG 2.0  --   --   --   --    Liver Function Tests: No results for input(s): AST, ALT, ALKPHOS, BILITOT, PROT, ALBUMIN in the last 168 hours. No results for input(s): LIPASE, AMYLASE in the last 168 hours. No results for input(s): AMMONIA in the last 168 hours. CBC:  Recent Labs Lab 03/18/15 0237 03/19/15 0415 03/20/15 0503 03/21/15 0425 03/21/15 1314 03/22/15 0444 03/23/15 0423  WBC 9.9 6.9 8.9 5.8  --  6.8 5.7  NEUTROABS 8.0*  --   --   --   --   --   --   HGB 11.8* 8.7* 8.2* 7.5* 8.3* 7.8* 7.5*  HCT 34.1* 25.0* 23.4* 21.9* 24.7* 22.6* 22.2*  MCV 92.9 91.9 92.5 93.2  --  94.2 93.7  PLT 165 106* 112* 108*  --  139* 156   Cardiac Enzymes: No results for input(s): CKTOTAL, CKMB, CKMBINDEX, TROPONINI in the last 168 hours. BNP: BNP (last 3 results) No results for input(s): BNP in the last 8760 hours.  ProBNP (last 3 results) No results for input(s): PROBNP in the last 8760 hours.  CBG: No results for input(s): GLUCAP in the last 168 hours.     SignedIrine Seal MD [facility] 03/23/2015, 2:23 PM

## 2015-03-23 NOTE — Progress Notes (Signed)
Pt is medically stable for d/c back to [facility] for rehab. Pt is in agreement with d/c plan. Pt reports her spouse is aware of d/cplan. [facility] transport is required. NSG has reviewed d/c summary, scripts, avs. Scripts included in d/c packet. D/C summary sent to SNF for review prior to d/c.  Werner Lean LCSW [phone]

## 2015-03-23 NOTE — Progress Notes (Signed)
PT Cancellation Note  Patient Details Name: DEVINN VOSHELL MRN: [id number] DOB: 04/18/1927   Cancelled Treatment:     blood transfusion then D/C to SNF   Nathanial Rancher 03/23/2015, 4:28 PM

## 2015-03-23 NOTE — NC FL2 (Deleted)
[place] LEVEL OF CARE SCREENING TOOL     IDENTIFICATION  Patient Name: Bailey [name] Birthdate: Oct 10, 1926 Sex: female Admission Date (Current Location): 03/18/2015  [facility] and [place] Number:     Facility and Address:  [facility],  [address] [place], [place]      Provider Number: [id number]  Attending Physician Name and Address:  Eugenie Filler, MD  Relative Name and Phone Number:       Current Level of Care: Hospital Recommended Level of Care: [place] Prior Approval Number:    Date Approved/Denied:   PASRR Number: [id number]  Discharge Plan: SNF    Current Diagnoses: Patient Active Problem List   Diagnosis Date Noted  . Nausea with vomiting 03/22/2015  . Postoperative anemia due to acute blood loss 03/19/2015  . Thyroid activity decreased   . Closed right hip fracture ([place]) 03/18/2015  . Pertrochanteric fracture of right femur ([place]) 03/18/2015  . Fracture, intertrochanteric, right femur ([place])   . CKD (chronic kidney disease) stage 2, GFR 60-89 ml/min 11/18/2014  . Osteoarthritis 03/25/2014  . Osteoarthrosis, forearm   . Senile osteoporosis   . Malignant neoplasm of breast (female), unspecified site   . Anemia   . Insomnia   . Hypothyroidism   . Pure hypercholesterolemia   . Closed fracture of unspecified part of vertebral column without mention of spinal cord injury   . Essential hypertension   . Osteoarthrosis, hip   . Edema     Orientation ACTIVITIES/SOCIAL BLADDER RESPIRATION    Self, Time, Situation, Place  Active Continent Normal  BEHAVIORAL SYMPTOMS/MOOD NEUROLOGICAL BOWEL NUTRITION STATUS   (no behaviors)   Continent Diet  PHYSICIAN VISITS COMMUNICATION OF NEEDS Height & Weight Skin  Over 180 days Verbally 5\' 8"  (172.7 cm) 130 lbs. Surgical wounds          AMBULATORY STATUS RESPIRATION    Assist extensive Normal      Personal Care Assistance Level of Assistance  Bathing,  Dressing Bathing Assistance: Limited assistance   Dressing Assistance: Limited assistance      Functional Limitations Info                SPECIAL CARE FACTORS FREQUENCY  PT (By licensed PT)     PT Frequency: 6/7 x wkly             Additional Factors Info                  Current Medications (03/23/2015): Current Facility-Administered Medications  Medication Dose Route Frequency Provider Last Rate Last Dose  . acetaminophen (TYLENOL) tablet 650 mg  650 mg Oral Q6H PRN Rod Can, MD   650 mg at 03/22/15 0129   Or  . acetaminophen (TYLENOL) suppository 650 mg  650 mg Rectal Q6H PRN Rod Can, MD      . aspirin EC tablet 325 mg  325 mg Oral BID PC Rod Can, MD   325 mg at 03/23/15 0930  . atenolol (TENORMIN) tablet 12.5 mg  12.5 mg Oral Daily Irine Seal V, MD   12.5 mg at 03/23/15 0930  . cholecalciferol (VITAMIN D) tablet 3,000 Units  3,000 Units Oral Daily Eugenie Filler, MD   3,000 Units at 03/23/15 0930  . gi cocktail (Maalox,Lidocaine,Donnatal)  30 mL Oral TID PRN Eugenie Filler, MD   30 mL at 03/22/15 1112  . HYDROcodone-acetaminophen (NORCO/VICODIN) 5-325 MG per tablet 1-2 tablet  1-2 tablet Oral Q6H PRN Aaron Edelman  Swinteck, MD   2 tablet at 03/23/15 1410  . levothyroxine (SYNTHROID, LEVOTHROID) tablet 112 mcg  112 mcg Oral QAC breakfast Etta Quill, DO   112 mcg at 03/23/15 0804  . magnesium hydroxide (MILK OF MAGNESIA) suspension 30 mL  30 mL Oral Daily PRN Eugenie Filler, MD      . menthol-cetylpyridinium (CEPACOL) lozenge 3 mg  1 lozenge Oral PRN Rod Can, MD      . morphine 2 MG/ML injection 0.5 mg  0.5 mg Intravenous Q2H PRN Rod Can, MD   0.5 mg at 03/20/15 0659  . ondansetron (ZOFRAN) 8 mg in sodium chloride 0.9 % 50 mL IVPB  8 mg Intravenous Q6H PRN Eugenie Filler, MD      . Derrill Memo ON 03/24/2015] pantoprazole (PROTONIX) EC tablet 40 mg  40 mg Oral Daily Irine Seal V, MD      . phenol (CHLORASEPTIC) mouth spray  1 spray  1 spray Mouth/Throat PRN Rod Can, MD       Do not use this list as official medication orders. Please verify with discharge summary.  Discharge Medications:   Medication List    STOP taking these medications        amLODipine 5 MG tablet  Commonly known as:  NORVASC     aspirin 81 MG tablet  Replaced by:  aspirin 325 MG EC tablet     irbesartan 300 MG tablet  Commonly known as:  AVAPRO      TAKE these medications        aspirin 325 MG EC tablet  Take 1 tablet (325 mg total) by mouth 2 (two) times daily after a meal.     atenolol 25 MG tablet  Commonly known as:  TENORMIN  Take one tablet by mouth once daily for blood pressure     cholecalciferol 1000 UNITS tablet  Commonly known as:  VITAMIN D  Take 3,000 Units by mouth daily.     denosumab 60 MG/ML Soln injection  Commonly known as:  PROLIA  Inject 60 mg into the skin every 6 (six) months. Administer in upper arm, thigh, or abdomen     diclofenac 50 MG EC tablet  Commonly known as:  VOLTAREN  Take one tablet by mouth twice daily to help arthritis pain     levothyroxine 112 MCG tablet  Commonly known as:  SYNTHROID, LEVOTHROID  Take one tablet by mouth once daily for thyroid supplement     lidocaine 5 %  Commonly known as:  LIDODERM  Place 1-3 patches onto the skin daily. Apply in AM and remove in PM. Remove & Discard patch within 12 hours or as directed by MD     oxyCODONE-acetaminophen 5-325 MG tablet  Commonly known as:  PERCOCET/ROXICET  Take one tablet every 4 hours as needed to control pain     pantoprazole 40 MG tablet  Commonly known as:  PROTONIX  Take 1 tablet (40 mg total) by mouth daily.     prochlorperazine 10 MG tablet  Commonly known as:  COMPAZINE  Take 1 tablet (10 mg total) by mouth every 6 (six) hours as needed for nausea or vomiting.     VITAMIN B-12 CR PO  Take 1 tablet by mouth daily.     zolpidem 10 MG tablet  Commonly known as:  AMBIEN  Take one tablet by mouth  by mouth at bedtime for sleep        Relevant Imaging Results:  Relevant Lab Results:  Recent Labs    Additional Information  SS # [id number]  Kymora Sciara, Randall An, LCSW

## 2015-03-24 ENCOUNTER — Encounter: Payer: Self-pay | Admitting: Nurse Practitioner

## 2015-03-24 ENCOUNTER — Non-Acute Institutional Stay (SKILLED_NURSING_FACILITY): Payer: Medicare Other | Admitting: Nurse Practitioner

## 2015-03-24 ENCOUNTER — Telehealth: Payer: Self-pay | Admitting: *Deleted

## 2015-03-24 DIAGNOSIS — R112 Nausea with vomiting, unspecified: Secondary | ICD-10-CM | POA: Diagnosis not present

## 2015-03-24 DIAGNOSIS — K219 Gastro-esophageal reflux disease without esophagitis: Secondary | ICD-10-CM | POA: Diagnosis not present

## 2015-03-24 DIAGNOSIS — D62 Acute posthemorrhagic anemia: Secondary | ICD-10-CM

## 2015-03-24 DIAGNOSIS — K5900 Constipation, unspecified: Secondary | ICD-10-CM

## 2015-03-24 DIAGNOSIS — I10 Essential (primary) hypertension: Secondary | ICD-10-CM

## 2015-03-24 DIAGNOSIS — M169 Osteoarthritis of hip, unspecified: Secondary | ICD-10-CM | POA: Diagnosis not present

## 2015-03-24 DIAGNOSIS — G4700 Insomnia, unspecified: Secondary | ICD-10-CM | POA: Diagnosis not present

## 2015-03-24 DIAGNOSIS — K5909 Other constipation: Secondary | ICD-10-CM | POA: Insufficient documentation

## 2015-03-24 DIAGNOSIS — E039 Hypothyroidism, unspecified: Secondary | ICD-10-CM

## 2015-03-24 DIAGNOSIS — M810 Age-related osteoporosis without current pathological fracture: Secondary | ICD-10-CM

## 2015-03-24 LAB — TYPE AND SCREEN
ABO/RH(D): A NEG
ANTIBODY SCREEN: NEGATIVE
Unit division: 0

## 2015-03-24 NOTE — Assessment & Plan Note (Signed)
MiraLax 1/2 17gm +4 Oz fluid daily.

## 2015-03-24 NOTE — Assessment & Plan Note (Signed)
Continue Vit D, Prolia q6 months.

## 2015-03-24 NOTE — Assessment & Plan Note (Signed)
Stable, continue Pantoprazole 40mg daily.   

## 2015-03-24 NOTE — Progress Notes (Signed)
Patient ID: Bailey [name], female   DOB: 1926-12-04, 81 y.o.   MRN: [id number]  Location:  SNF [facility] Provider:  Marlana Latus NP  Code Status:  DNR Goals of care: Advanced Directive information    Chief Complaint  Patient presents with  . Medical Management of Chronic Issues     HPI: Patient is a 81 y.o. female seen in the SNF at [facility] today for evaluation of hospital stay 03/18/15-03/23/15 for comminuted right peritrochateric femur fracture/ mildly displaced fracture junction of right superior pubic ramus and acetabulum/ Mildly displaced fracture right inferior pubic ramus  Secondary to mechanical fall. Patient status post supplementary fixation right peritrochaneric femur fracture per Dr. Lyla Glassing 03/19/2015. follow-up with orthjopedics in 2 weeks.   Review of Systems:  Review of Systems  Constitutional: Positive for malaise/fatigue. Negative for fever, chills, weight loss and diaphoresis.  HENT: Negative for congestion, ear discharge, ear pain, hearing loss, nosebleeds and tinnitus.        Sinus allergies  Eyes: Negative for blurred vision, double vision, photophobia and pain.  Respiratory: Positive for shortness of breath (On exertion). Negative for cough and wheezing.   Cardiovascular: Negative for chest pain, palpitations and leg swelling.  Gastrointestinal: Positive for constipation. Negative for heartburn, nausea, vomiting and abdominal pain.       Flatulence. Hemorrhoids.  Genitourinary: Negative.  Negative for dysuria, urgency and frequency.  Musculoskeletal: Positive for back pain and joint pain.       History of osteoporosis. Pain in the left knee and both hips.  Skin: Negative.  Negative for itching and rash.       Right hip surgical incision intact  Neurological: Negative for dizziness, tremors, sensory change, speech change, focal weakness and headaches.  Psychiatric/Behavioral: Negative for depression, suicidal ideas, hallucinations, memory loss and substance  abuse. The patient has insomnia. The patient is not nervous/anxious.     Past Medical History  Diagnosis Date  . Osteoarthrosis, unspecified whether generalized or localized, forearm     bilaterally wrist  . Allergic rhinitis, cause unspecified   . Senile osteoporosis   . Malignant neoplasm of breast (female), unspecified site 1990    left. Had surgerey and chemo, but no radiation  . Acquired cyst of kidney     left  . Left cavernous carotid aneurysm 08/2008    1-1/62mm aneurysm of cavernous carotid artery  . Anemia, unspecified   . Unspecified gastritis and gastroduodenitis without mention of hemorrhage   . 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 Other and unspecified nonspecific immunological findings     positive ANA  . History of Epstein-Barr virus infection   . Herpes simplex without mention of complication   . Insomnia, unspecified   . Unspecified hypothyroidism   . Hyposmolality and/or hyponatremia   . Unspecified vitamin D deficiency   . Pure hypercholesterolemia   . Other B-complex deficiencies   . Osteoarthrosis of knee     bilaterally  . Closed fracture of unspecified part of vertebral column without mention of spinal cord injury     T7 s/p kyphoplasty, T12  . Unspecified essential hypertension   . Pain in joint, site unspecified     severe, diffuse, chronic pain  . Osteoarthrosis, hip   . Fracture of left hip ([place]) 06/13/2007  . Edema 2015  . CKD stage 2 due to type 2 diabetes mellitus ([place]) 11/18/2014  . Neuropathy ([place])   . Positive ANA (antinuclear  antibody)   . Insomnia     Patient Active Problem List   Diagnosis Date Noted  . GERD (gastroesophageal reflux disease) 03/24/2015  . Constipation 03/24/2015  . Nausea with vomiting 03/22/2015  . Postoperative anemia due to acute blood loss 03/19/2015  . Thyroid activity decreased   . Closed right hip fracture ([place]) 03/18/2015  . Pertrochanteric fracture of right  femur ([place]) 03/18/2015  . Fracture, intertrochanteric, right femur ([place])   . CKD (chronic kidney disease) stage 2, GFR 60-89 ml/min 11/18/2014  . Osteoarthritis 03/25/2014  . Osteoarthrosis, forearm   . Senile osteoporosis   . Malignant neoplasm of breast (female), unspecified site   . Anemia   . Insomnia   . Hypothyroidism   . Pure hypercholesterolemia   . Closed fracture of unspecified part of vertebral column without mention of spinal cord injury   . Essential hypertension   . Osteoarthrosis, hip   . Edema     Allergies  Allergen Reactions  . Aspirin     Drop in body temp with large quantities, can tolerate low doses of aspirin  . Penicillins Swelling    Medications: Patient's Medications  New Prescriptions   No medications on file  Previous Medications   ASPIRIN EC 325 MG EC TABLET    Take 1 tablet (325 mg total) by mouth 2 (two) times daily after a meal.   ATENOLOL (TENORMIN) 25 MG TABLET    Take one tablet by mouth once daily for blood pressure   CHOLECALCIFEROL (VITAMIN D) 1000 UNITS TABLET    Take 3,000 Units by mouth daily.   CYANOCOBALAMIN (VITAMIN B-12 CR PO)    Take 1 tablet by mouth daily.    DENOSUMAB (PROLIA) 60 MG/ML SOLN INJECTION    Inject 60 mg into the skin every 6 (six) months. Administer in upper arm, thigh, or abdomen   DICLOFENAC (VOLTAREN) 50 MG EC TABLET    Take one tablet by mouth twice daily to help arthritis pain   LEVOTHYROXINE (SYNTHROID, LEVOTHROID) 112 MCG TABLET    Take one tablet by mouth once daily for thyroid supplement   LIDOCAINE (LIDODERM) 5 %    Place 1-3 patches onto the skin daily. Apply in AM and remove in PM. Remove & Discard patch within 12 hours or as directed by MD   OXYCODONE-ACETAMINOPHEN (PERCOCET/ROXICET) 5-325 MG TABLET    Take one tablet every 4 hours as needed to control pain   PANTOPRAZOLE (PROTONIX) 40 MG TABLET    Take 1 tablet (40 mg total) by mouth daily.   PROCHLORPERAZINE (COMPAZINE) 10 MG TABLET    Take 1 tablet (10  mg total) by mouth every 6 (six) hours as needed for nausea or vomiting.   ZOLPIDEM (AMBIEN) 10 MG TABLET    Take one tablet by mouth by mouth at bedtime for sleep  Modified Medications   No medications on file  Discontinued Medications   No medications on file    Physical Exam: Filed Vitals:   03/24/15 1053  BP: 133/83  Pulse: 80  Temp: 98 F (36.7 C)  TempSrc: Tympanic  Resp: 16   There is no weight on file to calculate BMI.  Physical Exam  Constitutional: She is oriented to person, place, and time. No distress.  Elderly. Frail. Slow in movement due to back pain.  HENT:  Right Ear: External ear normal.  Left Ear: External ear normal.  Nose: Nose normal.  Mouth/Throat: Oropharynx is clear and moist. No oropharyngeal exudate.  Eyes: Conjunctivae  and EOM are normal. Pupils are equal, round, and reactive to light.  Neck: No JVD present. No tracheal deviation present. No thyromegaly present.  Cardiovascular: Normal rate, regular rhythm, normal heart sounds and intact distal pulses.  Exam reveals no gallop and no friction rub.   No murmur heard. Pulmonary/Chest: No respiratory distress. She has no wheezes. She has no rales. She exhibits no tenderness.  Abdominal: She exhibits no distension and no mass. There is no tenderness.  Musculoskeletal: Normal range of motion. She exhibits edema and tenderness (Lumbar area and left knee).  Right hip pain with movement, very trace edema R leg  Lymphadenopathy:    She has no cervical adenopathy.  Neurological: She is alert and oriented to person, place, and time. She has normal reflexes.  10/22/2013 MMSE 30/30. Passed clock drawing  Skin: No rash noted. No erythema. No pallor.  Right hip surgical incision intact  Psychiatric: She has a normal mood and affect. Her behavior is normal. Judgment and thought content normal.    Labs reviewed: Basic Metabolic Panel:  Recent Labs  03/19/15 0415  03/21/15 0425 03/22/15 0444 03/23/15 0423    NA 135  < > 135 134* 135  K 4.2  < > 3.5 3.6 3.5  CL 107  < > 108 104 105  CO2 22  < > 23 22 24   GLUCOSE 170*  < > 112* 109* 104*  BUN 24*  < > 18 15 17   CREATININE 1.05*  < > 0.96 0.98 1.16*  CALCIUM 7.9*  < > 7.1* 7.2* 7.3*  MG 2.0  --   --   --   --   < > = values in this interval not displayed.  Liver Function Tests:  Recent Labs  11/10/14  AST 16  ALT 11  ALKPHOS 50    CBC:  Recent Labs  03/18/15 0237  03/21/15 0425  03/22/15 0444 03/23/15 0423 03/23/15 1713  WBC 9.9  < > 5.8  --  6.8 5.7  --   NEUTROABS 8.0*  --   --   --   --   --   --   HGB 11.8*  < > 7.5*  < > 7.8* 7.5* 9.8*  HCT 34.1*  < > 21.9*  < > 22.6* 22.2* 28.3*  MCV 92.9  < > 93.2  --  94.2 93.7  --   PLT 165  < > 108*  --  139* 156  --   < > = values in this interval not displayed.  Lab Results  Component Value Date   TSH 0.613 03/19/2015   No results found for: HGBA1C Lab Results  Component Value Date   CHOL 175 11/10/2014   HDL 53 11/10/2014   LDLCALC 87 11/10/2014   TRIG 173* 11/10/2014    Significant Diagnostic Results since last visit: none  Patient Care Team: Estill Dooms, MD as PCP - General (Internal Medicine) September Mormile X, NP as Nurse Practitioner (Nurse Practitioner)  Assessment/Plan Problem List Items Addressed This Visit    Essential hypertension - Primary (Chronic)    home regimen of atenolol. Norvasc, Avapro were discontinued during hospital stay.       Senile osteoporosis    Continue Vit D, Prolia q6 months.       Insomnia    Chronic, continue Ambien 10mg  nightly.       Osteoarthrosis, hip      Comminuted right peritrochateric femur fracture/ mildly displaced fracture junction of right superior pubic ramus  and acetabulum/ Mildly displaced fracture right inferior pubic ramus   Secondary to mechanical fall. Patient status post supplementary fixation right peritrochaneric femur fracture per Dr. Lyla Glassing 03/19/2015. follow-up with orthjopedics in 2 weeks.    Continue Prn Oxycodone/Acetaminofen 5/325 q4h       Postoperative anemia due to acute blood loss    Anemia panel consistent with anemia of chronic disease. Hemoglobin trended down from 11.8 on admission to 7.5. Patient was transfused 1 unit PRBCs. No overt blood loss. Update CBC      Thyroid activity decreased    03/09/15 TSH 2.62, continue Synthroid 153mcg.       Nausea with vomiting    Prn compazine 10mg  q6 prn available to her, update CMP      GERD (gastroesophageal reflux disease)    Stable, continue Pantoprazole 40mg  daily.       Constipation    MiraLax 1/2 17gm +4 Oz fluid daily.           Family/ staff Communication: here for rehab, [place].   Labs/tests ordered:  CBC, CMP  ManXie Calani Gick NP [facility] [address], [place] [address] On Call:  [phone] & follow prompts after 5pm & weekends Office Phone:  [phone] Office Fax:  [phone]

## 2015-03-24 NOTE — Assessment & Plan Note (Signed)
Prn compazine 10mg  q6 prn available to her, update CMP

## 2015-03-24 NOTE — Assessment & Plan Note (Signed)
Chronic, continue Ambien 10mg  nightly.

## 2015-03-24 NOTE — Assessment & Plan Note (Addendum)
Comminuted right peritrochateric femur fracture/ mildly displaced fracture junction of right superior pubic ramus and acetabulum/ Mildly displaced fracture right inferior pubic ramus   Secondary to mechanical fall. Patient status post supplementary fixation right peritrochaneric femur fracture per Dr. Lyla Glassing 03/19/2015. follow-up with orthjopedics in 2 weeks.   Continue Prn Oxycodone/Acetaminofen 5/325 q4h

## 2015-03-24 NOTE — Telephone Encounter (Signed)
She wanted me to let Dr. Nyoka Cowden know that she was in [place] d/t a fx hip & pelvis.  DOB: 1927-04-20.  D/C from [facility]

## 2015-03-24 NOTE — Assessment & Plan Note (Signed)
03/09/15 TSH 2.62, continue Synthroid 119mcg.

## 2015-03-24 NOTE — Assessment & Plan Note (Signed)
Anemia panel consistent with anemia of chronic disease. Hemoglobin trended down from 11.8 on admission to 7.5. Patient was transfused 1 unit PRBCs. No overt blood loss. Update CBC

## 2015-03-24 NOTE — Assessment & Plan Note (Signed)
home regimen of atenolol. Norvasc, Avapro were discontinued during hospital stay.

## 2015-03-26 LAB — BASIC METABOLIC PANEL
BUN: 24 mg/dL — AB (ref 4–21)
CREATININE: 1.4 mg/dL — AB (ref 0.5–1.1)
GLUCOSE: 96 mg/dL
POTASSIUM: 4.1 mmol/L (ref 3.4–5.3)
SODIUM: 134 mmol/L — AB (ref 137–147)

## 2015-03-26 LAB — HEPATIC FUNCTION PANEL
ALK PHOS: 41 U/L (ref 25–125)
ALT: 11 U/L (ref 7–35)
AST: 15 U/L (ref 13–35)
BILIRUBIN, TOTAL: 2 mg/dL

## 2015-03-26 LAB — CBC AND DIFFERENTIAL
HEMATOCRIT: 29 % — AB (ref 36–46)
HEMOGLOBIN: 9.8 g/dL — AB (ref 12.0–16.0)
Platelets: 238 10*3/uL (ref 150–399)
WBC: 7 10^3/mL

## 2015-03-27 ENCOUNTER — Encounter: Payer: Self-pay | Admitting: Nurse Practitioner

## 2015-03-27 ENCOUNTER — Non-Acute Institutional Stay (SKILLED_NURSING_FACILITY): Payer: Medicare Other | Admitting: Nurse Practitioner

## 2015-03-27 DIAGNOSIS — N182 Chronic kidney disease, stage 2 (mild): Secondary | ICD-10-CM

## 2015-03-27 DIAGNOSIS — E46 Unspecified protein-calorie malnutrition: Secondary | ICD-10-CM

## 2015-03-27 DIAGNOSIS — G4700 Insomnia, unspecified: Secondary | ICD-10-CM | POA: Diagnosis not present

## 2015-03-27 DIAGNOSIS — I10 Essential (primary) hypertension: Secondary | ICD-10-CM | POA: Diagnosis not present

## 2015-03-27 DIAGNOSIS — M169 Osteoarthritis of hip, unspecified: Secondary | ICD-10-CM | POA: Diagnosis not present

## 2015-03-27 DIAGNOSIS — E039 Hypothyroidism, unspecified: Secondary | ICD-10-CM | POA: Diagnosis not present

## 2015-03-27 DIAGNOSIS — K219 Gastro-esophageal reflux disease without esophagitis: Secondary | ICD-10-CM

## 2015-03-27 DIAGNOSIS — E440 Moderate protein-calorie malnutrition: Secondary | ICD-10-CM | POA: Insufficient documentation

## 2015-03-27 DIAGNOSIS — K5900 Constipation, unspecified: Secondary | ICD-10-CM

## 2015-03-27 DIAGNOSIS — N390 Urinary tract infection, site not specified: Secondary | ICD-10-CM | POA: Diagnosis not present

## 2015-03-27 DIAGNOSIS — D62 Acute posthemorrhagic anemia: Secondary | ICD-10-CM | POA: Diagnosis not present

## 2015-03-27 NOTE — Assessment & Plan Note (Signed)
Stable, continue Pantoprazole 40mg daily.   

## 2015-03-27 NOTE — Assessment & Plan Note (Signed)
Comminuted right peritrochateric femur fracture/ mildly displaced fracture junction of right superior pubic ramus and acetabulum/ Mildly displaced fracture right inferior pubic ramus   Secondary to mechanical fall. Patient status post supplementary fixation right peritrochaneric femur fracture per Dr. Swinteck 03/19/2015. follow-up with orthjopedics in 2 weeks.   Continue Prn Oxycodone/Acetaminofen 5/325 q4h  

## 2015-03-27 NOTE — Assessment & Plan Note (Signed)
Decrease Zolpidem to 5mg  nightly per pharm recommendation.

## 2015-03-27 NOTE — Assessment & Plan Note (Signed)
MiraLax 1/2 17gm +4 Oz fluid daily.  

## 2015-03-27 NOTE — Progress Notes (Signed)
Patient ID: Bailey [name], female   DOB: Jan 16, 1927, 81 y.o.   MRN: [id number]  Location:  SNF [facility] Provider:  Marlana Latus NP  Code Status:  DNR Goals of care: Advanced Directive information    Chief Complaint  Patient presents with  . Medical Management of Chronic Issues  . Acute Visit    UTI     HPI: Patient is a 81 y.o. female seen in the SNF at [facility] today for evaluation of UTI, urine culture 03/25/15 Providencia Stuartii, 7 day course of Septra DS started 03/25/15, tolerated.  Post op anemia, Hgb 9.8 03/25/14.   hospital stay 03/18/15-03/23/15 for comminuted right peritrochateric femur fracture/ mildly displaced fracture junction of right superior pubic ramus and acetabulum/ Mildly displaced fracture right inferior pubic ramus. Patient status post supplementary fixation right peritrochaneric femur fracture per Dr. Lyla Glassing 03/19/2015. follow-up with orthjopedics in 2 weeks.   Review of Systems:  Review of Systems  Constitutional: Positive for malaise/fatigue. Negative for fever, chills, weight loss and diaphoresis.  HENT: Negative for congestion, ear discharge, ear pain, hearing loss, nosebleeds and tinnitus.        Sinus allergies  Eyes: Negative for blurred vision, double vision, photophobia and pain.  Respiratory: Positive for shortness of breath (On exertion). Negative for cough and wheezing.   Cardiovascular: Negative for chest pain, palpitations and leg swelling.  Gastrointestinal: Positive for constipation. Negative for heartburn, nausea, vomiting and abdominal pain.       Flatulence. Hemorrhoids.  Genitourinary: Negative.  Negative for dysuria, urgency and frequency.  Musculoskeletal: Positive for back pain and joint pain.       History of osteoporosis. Pain in the left knee and both hips.  Skin: Negative.  Negative for itching and rash.       Right hip surgical incision intact  Neurological: Negative for dizziness, tremors, sensory change, speech change, focal  weakness and headaches.  Psychiatric/Behavioral: Negative for depression, suicidal ideas, hallucinations, memory loss and substance abuse. The patient has insomnia. The patient is not nervous/anxious.     Past Medical History  Diagnosis Date  . Osteoarthrosis, unspecified whether generalized or localized, forearm     bilaterally wrist  . Allergic rhinitis, cause unspecified   . Senile osteoporosis   . Malignant neoplasm of breast (female), unspecified site 1990    left. Had surgerey and chemo, but no radiation  . Acquired cyst of kidney     left  . 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 Anemia, unspecified   . Unspecified gastritis and gastroduodenitis without mention of hemorrhage   . 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 Other and unspecified nonspecific immunological findings     positive ANA  . History of Epstein-Barr virus infection   . Herpes simplex without mention of complication   . Insomnia, unspecified   . Unspecified hypothyroidism   . Hyposmolality and/or hyponatremia   . Unspecified vitamin D deficiency   . Pure hypercholesterolemia   . Other B-complex deficiencies   . Osteoarthrosis of knee     bilaterally  . Closed fracture of unspecified part of vertebral column without mention of spinal cord injury     T7 s/p kyphoplasty, T12  . Unspecified essential hypertension   . Pain in joint, site unspecified     severe, diffuse, chronic pain  . Osteoarthrosis, hip   . Fracture of left hip ([place]) 06/13/2007  .  Edema 2015  . CKD stage 2 due to type 2 diabetes mellitus ([place]) 11/18/2014  . Neuropathy ([place])   . Positive ANA (antinuclear antibody)   . Insomnia     Patient Active Problem List   Diagnosis Date Noted  . UTI (urinary tract infection) 03/27/2015  . Protein-calorie malnutrition ([place]) 03/27/2015  . Bilirubinemia 03/27/2015  . GERD (gastroesophageal reflux  disease) 03/24/2015  . Constipation 03/24/2015  . Nausea with vomiting 03/22/2015  . Postoperative anemia due to acute blood loss 03/19/2015  . Thyroid activity decreased   . Closed right hip fracture ([place]) 03/18/2015  . Pertrochanteric fracture of right femur ([place]) 03/18/2015  . Fracture, intertrochanteric, right femur ([place])   . CKD (chronic kidney disease) stage 2, GFR 60-89 ml/min 11/18/2014  . Osteoarthritis 03/25/2014  . Osteoarthrosis, forearm   . Senile osteoporosis   . Malignant neoplasm of breast (female), unspecified site   . Anemia   . Insomnia   . Hypothyroidism   . Pure hypercholesterolemia   . Closed fracture of unspecified part of vertebral column without mention of spinal cord injury   . Essential hypertension   . Osteoarthrosis, hip   . Edema     Allergies  Allergen Reactions  . Aspirin     Drop in body temp with large quantities, can tolerate low doses of aspirin  . Penicillins Swelling    Medications: Patient's Medications  New Prescriptions   No medications on file  Previous Medications   ASPIRIN EC 325 MG EC TABLET    Take 1 tablet (325 mg total) by mouth 2 (two) times daily after a meal.   ATENOLOL (TENORMIN) 25 MG TABLET    Take one tablet by mouth once daily for blood pressure   CHOLECALCIFEROL (VITAMIN D) 1000 UNITS TABLET    Take 3,000 Units by mouth daily.   CYANOCOBALAMIN (VITAMIN B-12 CR PO)    Take 1 tablet by mouth daily.    DENOSUMAB (PROLIA) 60 MG/ML SOLN INJECTION    Inject 60 mg into the skin every 6 (six) months. Administer in upper arm, thigh, or abdomen   DICLOFENAC (VOLTAREN) 50 MG EC TABLET    Take one tablet by mouth twice daily to help arthritis pain   LEVOTHYROXINE (SYNTHROID, LEVOTHROID) 112 MCG TABLET    Take one tablet by mouth once daily for thyroid supplement   LIDOCAINE (LIDODERM) 5 %    Place 1-3 patches onto the skin daily. Apply in AM and remove in PM. Remove & Discard patch within 12 hours or as directed by MD    OXYCODONE-ACETAMINOPHEN (PERCOCET/ROXICET) 5-325 MG TABLET    Take one tablet every 4 hours as needed to control pain   PANTOPRAZOLE (PROTONIX) 40 MG TABLET    Take 1 tablet (40 mg total) by mouth daily.   PROCHLORPERAZINE (COMPAZINE) 10 MG TABLET    Take 1 tablet (10 mg total) by mouth every 6 (six) hours as needed for nausea or vomiting.   ZOLPIDEM (AMBIEN) 10 MG TABLET    Take one tablet by mouth by mouth at bedtime for sleep  Modified Medications   No medications on file  Discontinued Medications   No medications on file    Physical Exam: Filed Vitals:   03/27/15 1116  BP: 132/74  Pulse: 74  Temp: 99.1 F (37.3 C)  TempSrc: Tympanic  Resp: 20   There is no weight on file to calculate BMI.  Physical Exam  Constitutional: She is oriented to person, place, and time. No  distress.  Elderly. Frail. Slow in movement due to back pain.  HENT:  Right Ear: External ear normal.  Left Ear: External ear normal.  Nose: Nose normal.  Mouth/Throat: Oropharynx is clear and moist. No oropharyngeal exudate.  Eyes: Conjunctivae and EOM are normal. Pupils are equal, round, and reactive to light.  Neck: No JVD present. No tracheal deviation present. No thyromegaly present.  Cardiovascular: Normal rate, regular rhythm, normal heart sounds and intact distal pulses.  Exam reveals no gallop and no friction rub.   No murmur heard. Pulmonary/Chest: No respiratory distress. She has no wheezes. She has no rales. She exhibits no tenderness.  Abdominal: She exhibits no distension and no mass. There is no tenderness.  Musculoskeletal: Normal range of motion. She exhibits edema and tenderness (Lumbar area and left knee).  Right hip pain with movement, very trace edema R leg  Lymphadenopathy:    She has no cervical adenopathy.  Neurological: She is alert and oriented to person, place, and time. She has normal reflexes.  10/22/2013 MMSE 30/30. Passed clock drawing  Skin: No rash noted. No erythema. No  pallor.  Right hip surgical incision intact  Psychiatric: She has a normal mood and affect. Her behavior is normal. Judgment and thought content normal.    Labs reviewed: Basic Metabolic Panel:  Recent Labs  03/19/15 0415  03/21/15 0425 03/22/15 0444 03/23/15 0423 03/26/15  NA 135  < > 135 134* 135 134*  K 4.2  < > 3.5 3.6 3.5 4.1  CL 107  < > 108 104 105  --   CO2 22  < > 23 22 24   --   GLUCOSE 170*  < > 112* 109* 104*  --   BUN 24*  < > 18 15 17  24*  CREATININE 1.05*  < > 0.96 0.98 1.16* 1.4*  CALCIUM 7.9*  < > 7.1* 7.2* 7.3*  --   MG 2.0  --   --   --   --   --   < > = values in this interval not displayed.  Liver Function Tests:  Recent Labs  11/10/14 03/26/15  AST 16 15  ALT 11 11  ALKPHOS 50 41    CBC:  Recent Labs  03/18/15 0237  03/21/15 0425  03/22/15 0444 03/23/15 0423 03/23/15 1713 03/26/15  WBC 9.9  < > 5.8  --  6.8 5.7  --  7.0  NEUTROABS 8.0*  --   --   --   --   --   --   --   HGB 11.8*  < > 7.5*  < > 7.8* 7.5* 9.8* 9.8*  HCT 34.1*  < > 21.9*  < > 22.6* 22.2* 28.3* 29*  MCV 92.9  < > 93.2  --  94.2 93.7  --   --   PLT 165  < > 108*  --  139* 156  --  238  < > = values in this interval not displayed.  Lab Results  Component Value Date   TSH 0.613 03/19/2015   No results found for: HGBA1C Lab Results  Component Value Date   CHOL 175 11/10/2014   HDL 53 11/10/2014   LDLCALC 87 11/10/2014   TRIG 173* 11/10/2014    Significant Diagnostic Results since last visit: none  Patient Care Team: Estill Dooms, MD as PCP - General (Internal Medicine) Monetta Lick X, NP as Nurse Practitioner (Nurse Practitioner)  Assessment/Plan Problem List Items Addressed This Visit    Essential hypertension (  Chronic)    Controlled, continue atenolol. Norvasc, Avapro were discontinued during hospital stay.       Insomnia    Decrease Zolpidem to 5mg  nightly per pharm recommendation.       Osteoarthrosis, hip     Comminuted right peritrochateric femur  fracture/ mildly displaced fracture junction of right superior pubic ramus and acetabulum/ Mildly displaced fracture right inferior pubic ramus   Secondary to mechanical fall. Patient status post supplementary fixation right peritrochaneric femur fracture per Dr. Lyla Glassing 03/19/2015. follow-up with orthjopedics in 2 weeks.   Continue Prn Oxycodone/Acetaminofen 5/325 q4h.       CKD (chronic kidney disease) stage 2, GFR 60-89 ml/min    03/26/15 Bun 24, creat 1.43      Postoperative anemia due to acute blood loss    Anemia panel consistent with anemia of chronic disease. Hemoglobin trended down from 11.8 on admission to 7.5. Patient was transfused 1 unit PRBCs. No overt blood loss. 03/26/15 Hgb 9.8.       Thyroid activity decreased    03/09/15 TSH 2.62, continue Synthroid 117mcg.       GERD (gastroesophageal reflux disease)    Stable, continue Pantoprazole 40mg  daily.       Constipation    MiraLax 1/2 17gm +4 Oz fluid daily.      UTI (urinary tract infection) - Primary    03/25/15 urine culture P. Stuartii, complete 7 day course of Septra DS bid.       Protein-calorie malnutrition ([place])    03/26/15 total protein 5.0, albumin 2.9 Dietary referral.       Bilirubinemia    03/26/15 total bilirubin 2.0, repeat LFT in 2 weeks.            Family/ staff Communication: here for rehab, [place] when [place]. Dietary referral.   Labs/tests ordered:  LFT  [facility] Biddie Sebek NP [facility] [address], [place] [address] On Call:  [phone] & follow prompts after 5pm & weekends Office Phone:  [phone] Office Fax:  [phone]

## 2015-03-27 NOTE — Assessment & Plan Note (Signed)
03/26/15 Bun 24, creat 1.43

## 2015-03-27 NOTE — Assessment & Plan Note (Signed)
Controlled, continue atenolol. Norvasc, Avapro were discontinued during hospital stay.

## 2015-03-27 NOTE — Assessment & Plan Note (Signed)
03/26/15 total bilirubin 2.0, repeat LFT in 2 weeks.

## 2015-03-27 NOTE — Assessment & Plan Note (Signed)
Anemia panel consistent with anemia of chronic disease. Hemoglobin trended down from 11.8 on admission to 7.5. Patient was transfused 1 unit PRBCs. No overt blood loss. 03/26/15 Hgb 9.8.

## 2015-03-27 NOTE — Assessment & Plan Note (Signed)
03/26/15 total protein 5.0, albumin 2.9 Dietary referral.

## 2015-03-27 NOTE — Assessment & Plan Note (Signed)
03/25/15 urine culture P. Stuartii, complete 7 day course of Septra DS bid.

## 2015-03-27 NOTE — Assessment & Plan Note (Signed)
03/09/15 TSH 2.62, continue Synthroid 112mcg.  

## 2015-03-30 ENCOUNTER — Encounter: Payer: Self-pay | Admitting: Internal Medicine

## 2015-03-30 ENCOUNTER — Non-Acute Institutional Stay (SKILLED_NURSING_FACILITY): Payer: Medicare Other | Admitting: Internal Medicine

## 2015-03-30 DIAGNOSIS — S32599A Other specified fracture of unspecified pubis, initial encounter for closed fracture: Secondary | ICD-10-CM | POA: Insufficient documentation

## 2015-03-30 DIAGNOSIS — E039 Hypothyroidism, unspecified: Secondary | ICD-10-CM

## 2015-03-30 DIAGNOSIS — I10 Essential (primary) hypertension: Secondary | ICD-10-CM | POA: Diagnosis not present

## 2015-03-30 DIAGNOSIS — S72141A Displaced intertrochanteric fracture of right femur, initial encounter for closed fracture: Secondary | ICD-10-CM | POA: Diagnosis not present

## 2015-03-30 DIAGNOSIS — R112 Nausea with vomiting, unspecified: Secondary | ICD-10-CM | POA: Diagnosis not present

## 2015-03-30 DIAGNOSIS — S32501D Unspecified fracture of right pubis, subsequent encounter for fracture with routine healing: Secondary | ICD-10-CM | POA: Diagnosis not present

## 2015-03-30 DIAGNOSIS — S32591D Other specified fracture of right pubis, subsequent encounter for fracture with routine healing: Secondary | ICD-10-CM

## 2015-03-30 DIAGNOSIS — D62 Acute posthemorrhagic anemia: Secondary | ICD-10-CM | POA: Diagnosis not present

## 2015-03-30 DIAGNOSIS — N390 Urinary tract infection, site not specified: Secondary | ICD-10-CM | POA: Diagnosis not present

## 2015-03-30 LAB — CBC AND DIFFERENTIAL
HCT: 29 % — AB (ref 36–46)
Hemoglobin: 9.9 g/dL — AB (ref 12.0–16.0)
Platelets: 232 10*3/uL (ref 150–399)
WBC: 5.2 10*3/mL

## 2015-03-30 NOTE — Progress Notes (Signed)
Patient ID: Bailey [name], female   DOB: 03/27/1927, 81 y.o.   MRN: [id number]    HISTORY AND PHYSICAL  Location:  [facility] Room Number: N 32 Place of Service: SNF (31)   Extended Emergency Contact Information Primary Emergency Contact: Amick,[name] Address: [address] [place]          [place], [place] [address] [name] of [place] Phone: [phone] Mobile Phone: [phone] Relation: Spouse Secondary Emergency Contact: O[place],[name] Address: [address]          [place], [place] [address] [name] of [place] Phone: [phone] Relation: Son  Advanced Directive information Does patient have an advance directive?: Yes, Type of Advance Directive: Healthcare Power of [place];Living will;Out of facility DNR (pink MOST or yellow form), Pre-existing out of facility DNR order (yellow form or pink MOST form): Yellow form placed in chart (order not valid for inpatient use), Does patient want to make changes to advanced directive?: No - Patient declined  Chief Complaint  Patient presents with  . New Admit To SNF    Following hospitalization    HPI:  Admitted 03/23/2015 to the skilled nursing facility portion of [facility] following hospitalization from 11-16 through 03/23/2015. Hospitalization was for an intertrochanteric right femur fracture following a fall. She underwent open reduction and internal fixation by Dr. Delfino Lovett. Patient also sustained fractures of the right inferior and superior pubic rami. These injuries have caused her a great deal of pain with movement.  Following her surgery, it was noted that she had acute blood loss anemia. She has a history of chronic anemia prior to this  Other problems include hypertension, vitamin D deficiency, osteoporosis for which she takes Prolia now, and hypothyroidism for which she is on levothyroxine 112 g daily. She is also hypertensive.  Patient is now admitted to the skilled  nursing area for rehabilitation. Goals are to improve mobility, allowing healing time for her right comminuted intertrochanteric femur fracture, and improved generalized strength. Patient will undergo training to improve self-care skills.  Patient has complained of some nausea since her surgery. Etiology is uncertain.  The pain in her right hip and pelvic area is quite intense with movement. This is caused her to want to stay quite still in her recliner chair for most of the day. It is taking much encouragement to get her to move around.  Past Medical History  Diagnosis Date  . Osteoarthrosis, unspecified whether generalized or localized, forearm     bilaterally wrist  . Allergic rhinitis, cause unspecified   . Senile osteoporosis   . Malignant neoplasm of breast (female), unspecified site 1990    left. Had surgerey and chemo, but no radiation  . Acquired cyst of kidney     left  . Left cavernous carotid aneurysm 08/2008    1-1/23m aneurysm of cavernous carotid artery  . Anemia, unspecified   . Unspecified gastritis and gastroduodenitis without mention of hemorrhage   . 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 Other and unspecified nonspecific immunological findings     positive ANA  . History of Epstein-Barr virus infection   . Herpes simplex without mention of complication   . Insomnia, unspecified   . Unspecified hypothyroidism   . Hyposmolality and/or hyponatremia   . Unspecified vitamin D deficiency   . Pure hypercholesterolemia   . Other B-complex deficiencies   . Osteoarthrosis of knee  bilaterally  . Closed fracture of unspecified part of vertebral column without mention of spinal cord injury     T7 s/p kyphoplasty, T12  . Unspecified essential hypertension   . Pain in joint, site unspecified     severe, diffuse, chronic pain  . Osteoarthrosis, hip   . Fracture of left hip ([facility]) 06/13/2007  . Edema 2015  . CKD stage 2 due  to type 2 diabetes mellitus ([facility]) 11/18/2014  . Neuropathy ([facility])   . Positive ANA (antinuclear antibody)   . Insomnia     Past Surgical History  Procedure Laterality Date  . Dilation and curettage of uterus      x2  . Middle ear surgery Bilateral 1938  . Mastectomy Left 04/29/1989  . Tonsillectomy  1968  . Nasal sinus surgery  1990 & 2000  . Cholecystectomy, laparoscopic  09/20/2006  . Orif hip fracture Left 06/13/2007    following a syncopal episode  . Kyphoplasty  07/03/2010    T7 and T12  . Kyphoplasty    . Femur im nail Right 03/18/2015    Procedure: INTRAMEDULLARY (IM) NAIL FEMORAL;  Surgeon: Rod Can, MD;  Location: [facility];  Service: Orthopedics;  Laterality: Right;    Patient Care Team: Estill Dooms, MD as PCP - General (Internal Medicine) Man Mast X, NP as Nurse Practitioner (Nurse Practitioner)  Social History   Social History  . Marital Status: Married    Spouse Name: N/A  . Number of Children: N/A  . Years of Education: N/A   Occupational History  . retired Marine scientist    Social History Main Topics  . Smoking status: Former Research scientist (life sciences)  . Smokeless tobacco: Never Used  . Alcohol Use: No  . Drug Use: No  . Sexual Activity: Not on file   Other Topics Concern  . Not on file   Social History Narrative   Lives at [facility] since 10/16/2013, moved from [place]    Married 1968   Never smoked   No alcohol    Exercise walking, walks with cane   POA/Living Will                reports that she has quit smoking. She has never used smokeless tobacco. She reports that she does not drink alcohol or use illicit drugs.  No family history on file. Family Status  Relation Status Death Age  . Mother Deceased 61    old age  . Father Deceased 20    war casualty  . Sister Deceased   . Brother Deceased   . Son Alive     Immunization History  Administered Date(s) Administered  . Influenza Split 02/13/2013  . Influenza-Unspecified 02/27/2014, 02/12/2015  .  Pneumococcal Polysaccharide-23 02/13/2013    Allergies  Allergen Reactions  . Aspirin     Drop in body temp with large quantities, can tolerate low doses of aspirin  . Penicillins Swelling    Medications: Patient's Medications  New Prescriptions   No medications on file  Previous Medications   ASPIRIN EC 325 MG EC TABLET    Take 1 tablet (325 mg total) by mouth 2 (two) times daily after a meal.   ATENOLOL (TENORMIN) 25 MG TABLET    Take one tablet by mouth once daily for blood pressure   CHOLECALCIFEROL (VITAMIN D) 1000 UNITS TABLET    Take 3,000 Units by mouth daily.   CYANOCOBALAMIN (VITAMIN B-12 CR PO)    Take 1 tablet by mouth daily.  DENOSUMAB (PROLIA) 60 MG/ML SOLN INJECTION    Inject 60 mg into the skin every 6 (six) months. Administer in upper arm, thigh, or abdomen   DICLOFENAC (VOLTAREN) 50 MG EC TABLET    Take one tablet by mouth twice daily to help arthritis pain   LEVOTHYROXINE (SYNTHROID, LEVOTHROID) 112 MCG TABLET    Take one tablet by mouth once daily for thyroid supplement   LIDOCAINE (LIDODERM) 5 %    Place 1-3 patches onto the skin daily. Apply in AM and remove in PM. Remove & Discard patch within 12 hours or as directed by MD   OXYCODONE-ACETAMINOPHEN (PERCOCET/ROXICET) 5-325 MG TABLET    Take one tablet every 4 hours as needed to control pain   PANTOPRAZOLE (PROTONIX) 40 MG TABLET    Take 1 tablet (40 mg total) by mouth daily.   PROCHLORPERAZINE (COMPAZINE) 10 MG TABLET    Take 1 tablet (10 mg total) by mouth every 6 (six) hours as needed for nausea or vomiting.   ZOLPIDEM (AMBIEN) 10 MG TABLET    Take one tablet by mouth by mouth at bedtime for sleep  Modified Medications   No medications on file  Discontinued Medications   No medications on file    Review of Systems  Constitutional: Negative for fever, chills and diaphoresis.  HENT: Negative for congestion, ear discharge, ear pain, hearing loss, nosebleeds and tinnitus.        Sinus allergies  Eyes:  Negative for photophobia and pain.  Respiratory: Positive for shortness of breath (On exertion). Negative for cough and wheezing.   Cardiovascular: Negative for chest pain, palpitations and leg swelling.  Gastrointestinal: Positive for constipation. Negative for nausea, vomiting and abdominal pain.       Flatulence. Hemorrhoids.  Genitourinary: Negative.  Negative for dysuria, urgency and frequency.  Musculoskeletal: Positive for back pain.       History of osteoporosis. Pain in the left knee and both hips. Pain in the right upper femur and across the pelvic area secondary to recent comminuted fracture of the right intertrochanteric femur and fractures of the superior and inferior pubic rami on the right side.  Skin: Negative.  Negative for rash.       Right hip surgical incision intact  Neurological: Negative for dizziness, tremors and headaches.  Psychiatric/Behavioral: Negative for suicidal ideas and hallucinations. The patient is not nervous/anxious.     Filed Vitals:   03/30/15 2239  BP: 154/80  Pulse: 61  Temp: 98.6 F (37 C)  Resp: 16  Height: '5\' 5"'  (1.651 m)  Weight: 130 lb (58.968 kg)  SpO2: 99%   Body mass index is 21.63 kg/(m^2).  Physical Exam  Constitutional: She is oriented to person, place, and time. No distress.  Elderly. Frail. Slow in movement due to back pain.  HENT:  Right Ear: External ear normal.  Left Ear: External ear normal.  Nose: Nose normal.  Mouth/Throat: Oropharynx is clear and moist. No oropharyngeal exudate.  Eyes: Conjunctivae and EOM are normal. Pupils are equal, round, and reactive to light.  Neck: No JVD present. No tracheal deviation present. No thyromegaly present.  Cardiovascular: Normal rate, regular rhythm, normal heart sounds and intact distal pulses.  Exam reveals no gallop and no friction rub.   No murmur heard. Pulmonary/Chest: No respiratory distress. She has no wheezes. She has no rales. She exhibits no tenderness.  Abdominal:  She exhibits no distension and no mass. There is no tenderness.  Musculoskeletal: Normal range of motion. She exhibits  edema and tenderness (Lumbar area and left knee).  Right hip pain with movement, very trace edema R leg  Lymphadenopathy:    She has no cervical adenopathy.  Neurological: She is alert and oriented to person, place, and time. She has normal reflexes.  10/22/2013 MMSE 30/30. Passed clock drawing  Skin: No rash noted. No erythema. No pallor.  Right hip surgical incision intact  Psychiatric: She has a normal mood and affect. Her behavior is normal. Judgment and thought content normal.    Labs reviewed: Lab Summary Latest Ref Rng 03/26/2015 03/23/2015 03/23/2015 03/22/2015 03/21/2015 03/21/2015 03/20/2015  Hemoglobin 12.0 - 16.0 g/dL 9.8(A) 9.8(L) 7.5(L) 7.8(L) 8.3(L) 7.5(L) 8.2(L)  Hematocrit 36 - 46 % 29(A) 28.3(L) 22.2(L) 22.6(L) 24.7(L) 21.9(L) 23.4(L)  White count - 7.0 (None) 5.7 6.8 (None) 5.8 8.9  Platelet count 150 - 399 K/L 238 (None) 156 139(L) (None) 108(L) 112(L)  Sodium 137 - 147 mmol/L 134(A) (None) 135 134(L) (None) 135 133(L)  Potassium 3.4 - 5.3 mmol/L 4.1 (None) 3.5 3.6 (None) 3.5 3.7  Calcium 8.9 - 10.3 mg/dL (None) (None) 7.3(L) 7.2(L) (None) 7.1(L) 7.6(L)  Phosphorus - (None) (None) (None) (None) (None) (None) (None)  Creatinine 0.5 - 1.1 mg/dL 1.4(A) (None) 1.16(H) 0.98 (None) 0.96 1.20(H)  AST 13 - 35 U/L 15 (None) (None) (None) (None) (None) (None)  Alk Phos 25 - 125 U/L 41 (None) (None) (None) (None) (None) (None)  Bilirubin - (None) (None) (None) (None) (None) (None) (None)  Glucose - 96 (None) 104(H) 109(H) (None) 112(H) 125(H)  Cholesterol - (None) (None) (None) (None) (None) (None) (None)  HDL cholesterol - (None) (None) (None) (None) (None) (None) (None)  Triglycerides - (None) (None) (None) (None) (None) (None) (None)  LDL Direct - (None) (None) (None) (None) (None) (None) (None)  LDL Calc - (None) (None) (None) (None) (None) (None) (None)    Total protein - (None) (None) (None) (None) (None) (None) (None)  Albumin - (None) (None) (None) (None) (None) (None) (None)   Lab Results  Component Value Date   BUN 24* 03/26/2015   No results found for: HGBA1C Lab Results  Component Value Date   TSH 0.613 03/19/2015          Ct Pelvis Wo Contrast  03/18/2015  CLINICAL DATA:  Acute pelvic pain after fall at home last night. EXAM: CT PELVIS WITHOUT CONTRAST TECHNIQUE: Multidetector CT imaging of the pelvis was performed following the standard protocol without intravenous contrast. COMPARISON:  None. FINDINGS: Status post surgical internal fixation of old proximal left femoral fracture, with intra medullary rod fixation of the left femoral shaft. Moderately comminuted and displaced fracture is seen involving the intertrochanteric region of the proximal right femur. Mildly displaced fracture is noted at the junction of the right superior pubic ramus and acetabulum. Mildly displaced fracture is seen involving the right inferior pubic ramus. The sacrum appears normal. Sacroiliac joints appear normal. Atherosclerosis of abdominal aorta is noted. Urinary bladder is decompressed secondary to Foley catheter. IMPRESSION: Moderately comminuted and displaced fracture involving the intertrochanteric region of the proximal right femur. Mildly displaced fracture is noted at the junction of the right superior pubic ramus and acetabulum. Mildly displaced fracture is seen involving the right inferior pubic ramus. Electronically Signed   By: Marijo Conception, M.D.   On: 03/18/2015 10:59   Pelvis Portable  03/18/2015  CLINICAL DATA:  Right femur fracture EXAM: PORTABLE PELVIS 1-2 VIEWS COMPARISON:  0900 hours FINDINGS: Dynamic compression screw and intra medullary rod transfix an intertrochanteric proximal right  femur fracture. There is a single distal interlocking screw. Anatomic alignment of the larger fracture fragments. No breakage or loosening of the  hardware. Osteopenia. A minimally displaced right acetabular fracture is re- demonstrated. IMPRESSION: ORIF intertrochanteric right femur fracture. Stable right acetabular fracture. Electronically Signed   By: Marybelle Killings M.D.   On: 03/18/2015 19:15   Ct Hip Right Wo Contrast  03/18/2015  CLINICAL DATA:  Right hip fracture . EXAM: CT OF THE RIGHT HIP WITHOUT CONTRAST TECHNIQUE: Multidetector CT imaging of the right hip was performed according to the standard protocol. Multiplanar CT image reconstructions were also generated. COMPARISON:  Radiographs 03/18/2015 FINDINGS: There is an intertrochanteric right hip fracture with varus angulation and mild impaction. There is no dislocation. There is a mildly comminuted inferior right pubic ramus fracture. There is a right superior ramus fracture extending into the anterior acetabular column. Central and posterior acetabulum intact. Superior acetabulum intact. Sacrum and sacroiliac joint not included within the field of view. Pubic symphysis appears intact. No large hematoma IMPRESSION: Intertrochanteric right hip fracture with varus angulation and mild impaction. Superior and inferior pubic ramus fractures on the right, with minimally displaced anterior acetabular column fracture. Electronically Signed   By: Andreas Newport M.D.   On: 03/18/2015 05:19   Dg C-arm 1-60 Min-no Report  03/18/2015  CLINICAL DATA: right im nail hip C-ARM 1-60 MINUTES Fluoroscopy was utilized by the requesting physician.  No radiographic interpretation.   Dg Hip Operative Unilat With Pelvis Right  03/18/2015  CLINICAL DATA:  ORIF right proximal femur fracture EXAM: DG C-ARM 1-60 MIN-NO REPORT; OPERATIVE RIGHT HIP WITH PELVIS COMPARISON:  Right hip radiographs from earlier today FINDINGS: Fluoroscopy time 57 seconds. Three spot fluoroscopic nondiagnostic intraoperative radiographs of the right hip demonstrate intra medullary rod in the right proximal femur with interlocking right  femoral neck pain and single distal interlocking screw transfixing the intratrochanteric right femur fracture. IMPRESSION: Intraoperative guidance for ORIF right proximal femur fracture. Electronically Signed   By: Ilona Sorrel M.D.   On: 03/18/2015 19:04   Dg Hip Unilat  With Pelvis 2-3 Views Right  03/18/2015  CLINICAL DATA:  Patient fell wall walking to the bathroom. Obvious deformity to the right hip. EXAM: DG HIP (WITH OR WITHOUT PELVIS) 2-3V RIGHT COMPARISON:  None. FINDINGS: Acute comminuted inter trochanteric fracture of the right hip with varus angulation of the fracture fragments and mild impaction. Acute fracture of the innominate bone with displacement. Acute fracture of the right inferior pubic ramus. No dislocation of the hip joint. No focal bone lesions identified. Vascular calcifications. IMPRESSION: Acute comminuted inter trochanteric fracture of the right hip with varus angulation and mild impaction. Fractures of the right inferior pubic ramus and innominate bone. Electronically Signed   By: Lucienne Capers M.D.   On: 03/18/2015 02:27   Dg Femur, Min 2 Views Right  03/18/2015  CLINICAL DATA:  [place] this morning getting out of bed to use the bathroom, tripped and landed on RIGHT hip, pain and inability to ambulate since, deformity EXAM: RIGHT FEMUR 2 VIEWS COMPARISON:  RIGHT hip radiographs 02/15/2015 FINDINGS: Osseous demineralization. Displaced intertrochanteric fracture RIGHT femur with varus angulation. No dislocation. Additionally identified displaced fractures of RIGHT superior and inferior pubic rami. Remainder of RIGHT femur appears intact. Knee joint alignment grossly normal. Extensive atherosclerotic calcifications. IMPRESSION: Displaced angulated intertrochanteric fracture RIGHT femur. Displaced fractures RIGHT superior and inferior pubic rami. Osseous demineralization. Electronically Signed   By: Lavonia Dana M.D.   On: 03/18/2015 10:37  Assessment/Plan  1. Fracture,  intertrochanteric, right femur, closed, initial encounter [facility]) Patient is engaged in physical therapy and occupational therapy. She is here for short-term rehabilitation. She is requiring much encouragement for movement due to pain on the right side.. Due to complaints of discomfort that have limited her progression with physical therapy, I increased her hydrocodone/APAP to 7.5/325 every 4 hours when necessary for pain  2. Postoperative anemia due to acute blood loss Routine follow-up of lab. Patient is on iron.  3. Nausea and vomiting, intractability of vomiting not specified, unspecified vomiting type Etiology uncertain, but may be related to recent urinary tract infection.  4. Urinary tract infection without hematuria, site unspecified Pseudomonas stuartii treated with Septra.  5. Essential hypertension Controlled  6. Hypothyroidism, unspecified hypothyroidism type Compensated  7. Bilirubinemia Follow-up lab is scheduled

## 2015-03-31 ENCOUNTER — Other Ambulatory Visit: Payer: Self-pay | Admitting: Nurse Practitioner

## 2015-03-31 ENCOUNTER — Encounter: Payer: Self-pay | Admitting: Nurse Practitioner

## 2015-03-31 ENCOUNTER — Non-Acute Institutional Stay (SKILLED_NURSING_FACILITY): Payer: Medicare Other | Admitting: Nurse Practitioner

## 2015-03-31 DIAGNOSIS — M62838 Other muscle spasm: Secondary | ICD-10-CM | POA: Diagnosis not present

## 2015-03-31 DIAGNOSIS — E039 Hypothyroidism, unspecified: Secondary | ICD-10-CM

## 2015-03-31 DIAGNOSIS — D62 Acute posthemorrhagic anemia: Secondary | ICD-10-CM

## 2015-03-31 DIAGNOSIS — I10 Essential (primary) hypertension: Secondary | ICD-10-CM

## 2015-03-31 DIAGNOSIS — D649 Anemia, unspecified: Secondary | ICD-10-CM

## 2015-03-31 NOTE — Assessment & Plan Note (Signed)
On and off, will try Robaxin 500mg  q6h prn. Observe.

## 2015-03-31 NOTE — Assessment & Plan Note (Signed)
controlled, continue atenolol. Norvasc, Avapro were discontinued during hospital stay.

## 2015-03-31 NOTE — Assessment & Plan Note (Signed)
Anemia panel consistent with anemia of chronic disease. Hemoglobin trended down from 11.8 on admission to 7.5. Patient was transfused 1 unit PRBCs. No overt blood loss. 03/26/15 Hgb 9.8 and 9.9 03/30/15

## 2015-03-31 NOTE — Assessment & Plan Note (Signed)
03/09/15 TSH 2.62, continue Synthroid 112mcg.  

## 2015-03-31 NOTE — Progress Notes (Signed)
Patient ID: Bailey [name], female   DOB: 01-09-1927, 81 y.o.   MRN: [id number]  Location:  SNF [facility] Provider:  Marlana Latus NP  Code Status:  DNR Goals of care: Advanced Directive information    Chief Complaint  Patient presents with  . Medical Management of Chronic Issues  . Acute Visit    right leg muscle spasm, left upper arm redness and warmth     HPI: Patient is a 81 y.o. female seen in the SNF at [facility] today for evaluation of right leg muscle spasm, on and off, desires medication for relief. Noted the left upper arm indurated area where pneumococcal vac inj site warmth and mild tenderness.   UTI, urine culture 03/25/15 Providencia Stuartii, 7 day course of Septra DS started 03/25/15, tolerated.  Post op anemia, Hgb 9.8 03/25/14.   hospital stay 03/18/15-03/23/15 for comminuted right peritrochateric femur fracture/ mildly displaced fracture junction of right superior pubic ramus and acetabulum/ Mildly displaced fracture right inferior pubic ramus. Patient status post supplementary fixation right peritrochaneric femur fracture per Dr. Lyla Glassing 03/19/2015. follow-up with orthjopedics in 2 weeks.   Review of Systems:  Review of Systems  Constitutional: Positive for malaise/fatigue. Negative for fever, chills, weight loss and diaphoresis.  HENT: Negative for congestion, ear discharge, ear pain, hearing loss, nosebleeds and tinnitus.        Sinus allergies  Eyes: Negative for blurred vision, double vision, photophobia and pain.  Respiratory: Positive for shortness of breath (On exertion). Negative for cough and wheezing.   Cardiovascular: Negative for chest pain, palpitations and leg swelling.  Gastrointestinal: Positive for constipation. Negative for heartburn, nausea, vomiting and abdominal pain.       Flatulence. Hemorrhoids.  Genitourinary: Negative.  Negative for dysuria, urgency and frequency.  Musculoskeletal: Positive for back pain and joint pain.       History of  osteoporosis. Pain in the left knee and both hips. C/o right leg muscle spasm on and off  Skin: Negative.  Negative for itching and rash.       Right hip surgical incision healing nicely Left upper arm indurated area warmth and mild tenderness at the pneumococcal vac site.   Neurological: Negative for dizziness, tremors, sensory change, speech change, focal weakness and headaches.  Psychiatric/Behavioral: Negative for depression, suicidal ideas, hallucinations, memory loss and substance abuse. The patient has insomnia. The patient is not nervous/anxious.     Past Medical History  Diagnosis Date  . Osteoarthrosis, unspecified whether generalized or localized, forearm     bilaterally wrist  . Allergic rhinitis, cause unspecified   . Senile osteoporosis   . Malignant neoplasm of breast (female), unspecified site 1990    left. Had surgerey and chemo, but no radiation  . Acquired cyst of kidney     left  . Left cavernous carotid aneurysm 08/2008    1-1/75mm aneurysm of cavernous carotid artery  . Anemia, unspecified   . Unspecified gastritis and gastroduodenitis without mention of hemorrhage   . 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 Other and unspecified nonspecific immunological findings     positive ANA  . History of Epstein-Barr virus infection   . Herpes simplex without mention of complication   . Insomnia, unspecified   . Unspecified hypothyroidism   . Hyposmolality and/or hyponatremia   . Unspecified vitamin D deficiency   . Pure hypercholesterolemia   . Other B-complex deficiencies   . Osteoarthrosis of knee  bilaterally  . Closed fracture of unspecified part of vertebral column without mention of spinal cord injury     T7 s/p kyphoplasty, T12  . Unspecified essential hypertension   . Pain in joint, site unspecified     severe, diffuse, chronic pain  . Osteoarthrosis, hip   . Fracture of left hip ([place]) 06/13/2007  . Edema  2015  . CKD stage 2 due to type 2 diabetes mellitus ([place]) 11/18/2014  . Neuropathy ([place])   . Positive ANA (antinuclear antibody)   . Insomnia     Patient Active Problem List   Diagnosis Date Noted  . Muscle spasm of right leg 03/31/2015  . Fracture of multiple pubic rami ([place]) 03/30/2015  . UTI (urinary tract infection) 03/27/2015  . Protein-calorie malnutrition ([place]) 03/27/2015  . Bilirubinemia 03/27/2015  . GERD (gastroesophageal reflux disease) 03/24/2015  . Constipation 03/24/2015  . Nausea with vomiting 03/22/2015  . Postoperative anemia due to acute blood loss 03/19/2015  . Fracture, intertrochanteric, right femur ([place])   . CKD (chronic kidney disease) stage 2, GFR 60-89 ml/min 11/18/2014  . Osteoarthritis 03/25/2014  . Osteoarthrosis, forearm   . Senile osteoporosis   . Malignant neoplasm of breast (female), unspecified site   . Anemia   . Insomnia   . Hypothyroidism   . Pure hypercholesterolemia   . Closed fracture of unspecified part of vertebral column without mention of spinal cord injury   . Essential hypertension   . Osteoarthrosis, hip   . Edema     Allergies  Allergen Reactions  . Aspirin     Drop in body temp with large quantities, can tolerate low doses of aspirin  . Penicillins Swelling    Medications: Patient's Medications  New Prescriptions   No medications on file  Previous Medications   ASPIRIN EC 325 MG EC TABLET    Take 1 tablet (325 mg total) by mouth 2 (two) times daily after a meal.   ATENOLOL (TENORMIN) 25 MG TABLET    Take one tablet by mouth once daily for blood pressure   CHOLECALCIFEROL (VITAMIN D) 1000 UNITS TABLET    Take 3,000 Units by mouth daily.   CYANOCOBALAMIN (VITAMIN B-12 CR PO)    Take 1 tablet by mouth daily.    DENOSUMAB (PROLIA) 60 MG/ML SOLN INJECTION    Inject 60 mg into the skin every 6 (six) months. Administer in upper arm, thigh, or abdomen   DICLOFENAC (VOLTAREN) 50 MG EC TABLET    Take one tablet by mouth twice  daily to help arthritis pain   LEVOTHYROXINE (SYNTHROID, LEVOTHROID) 112 MCG TABLET    Take one tablet by mouth once daily for thyroid supplement   LIDOCAINE (LIDODERM) 5 %    Place 1-3 patches onto the skin daily. Apply in AM and remove in PM. Remove & Discard patch within 12 hours or as directed by MD   OXYCODONE-ACETAMINOPHEN (PERCOCET/ROXICET) 5-325 MG TABLET    Take one tablet every 4 hours as needed to control pain   PANTOPRAZOLE (PROTONIX) 40 MG TABLET    Take 1 tablet (40 mg total) by mouth daily.   PROCHLORPERAZINE (COMPAZINE) 10 MG TABLET    Take 1 tablet (10 mg total) by mouth every 6 (six) hours as needed for nausea or vomiting.   ZOLPIDEM (AMBIEN) 10 MG TABLET    Take one tablet by mouth by mouth at bedtime for sleep  Modified Medications   No medications on file  Discontinued Medications   No medications on  file    Physical Exam: Filed Vitals:   03/31/15 1238  BP: 152/80  Pulse: 60  Temp: 99 F (37.2 C)  TempSrc: Tympanic  Resp: 16   There is no weight on file to calculate BMI.  Physical Exam  Constitutional: She is oriented to person, place, and time. No distress.  Elderly. Frail. Slow in movement due to back pain.  HENT:  Right Ear: External ear normal.  Left Ear: External ear normal.  Nose: Nose normal.  Mouth/Throat: Oropharynx is clear and moist. No oropharyngeal exudate.  Eyes: Conjunctivae and EOM are normal. Pupils are equal, round, and reactive to light.  Neck: No JVD present. No tracheal deviation present. No thyromegaly present.  Cardiovascular: Normal rate, regular rhythm, normal heart sounds and intact distal pulses.  Exam reveals no gallop and no friction rub.   No murmur heard. Pulmonary/Chest: No respiratory distress. She has no wheezes. She has no rales. She exhibits no tenderness.  Abdominal: She exhibits no distension and no mass. There is no tenderness.  Musculoskeletal: Normal range of motion. She exhibits edema and tenderness (Lumbar area and  left knee).  Right hip pain with movement, very trace edema R leg  Lymphadenopathy:    She has no cervical adenopathy.  Neurological: She is alert and oriented to person, place, and time. She has normal reflexes.  10/22/2013 MMSE 30/30. Passed clock drawing  Skin: No rash noted. No erythema. No pallor.  Right hip surgical incision healing nicely. Left upper arm indurated area warmth and mild tenderness at the pneumococcal vac site.     Psychiatric: She has a normal mood and affect. Her behavior is normal. Judgment and thought content normal.    Labs reviewed: Basic Metabolic Panel:  Recent Labs  03/19/15 0415  03/21/15 0425 03/22/15 0444 03/23/15 0423 03/26/15  NA 135  < > 135 134* 135 134*  K 4.2  < > 3.5 3.6 3.5 4.1  CL 107  < > 108 104 105  --   CO2 22  < > 23 22 24   --   GLUCOSE 170*  < > 112* 109* 104*  --   BUN 24*  < > 18 15 17  24*  CREATININE 1.05*  < > 0.96 0.98 1.16* 1.4*  CALCIUM 7.9*  < > 7.1* 7.2* 7.3*  --   MG 2.0  --   --   --   --   --   < > = values in this interval not displayed.  Liver Function Tests:  Recent Labs  11/10/14 03/26/15  AST 16 15  ALT 11 11  ALKPHOS 50 41    CBC:  Recent Labs  03/18/15 0237  03/21/15 0425  03/22/15 0444 03/23/15 0423 03/23/15 1713 03/26/15 03/30/15  WBC 9.9  < > 5.8  --  6.8 5.7  --  7.0 5.2  NEUTROABS 8.0*  --   --   --   --   --   --   --   --   HGB 11.8*  < > 7.5*  < > 7.8* 7.5* 9.8* 9.8* 9.9*  HCT 34.1*  < > 21.9*  < > 22.6* 22.2* 28.3* 29* 29*  MCV 92.9  < > 93.2  --  94.2 93.7  --   --   --   PLT 165  < > 108*  --  139* 156  --  238 232  < > = values in this interval not displayed.  Lab Results  Component Value Date  TSH 0.613 03/19/2015   No results found for: HGBA1C Lab Results  Component Value Date   CHOL 175 11/10/2014   HDL 53 11/10/2014   LDLCALC 87 11/10/2014   TRIG 173* 11/10/2014    Significant Diagnostic Results since last visit: none  Patient Care Team: Estill Dooms, MD as  PCP - General (Internal Medicine) Krosby Ritchie X, NP as Nurse Practitioner (Nurse Practitioner)  Assessment/Plan Problem List Items Addressed This Visit    Hypothyroidism (Chronic)    03/09/15 TSH 2.62, continue Synthroid 164mcg.       Essential hypertension (Chronic)    controlled, continue atenolol. Norvasc, Avapro were discontinued during hospital stay.       Postoperative anemia due to acute blood loss (Chronic)    Anemia panel consistent with anemia of chronic disease. Hemoglobin trended down from 11.8 on admission to 7.5. Patient was transfused 1 unit PRBCs. No overt blood loss. 03/26/15 Hgb 9.8 and 9.9 03/30/15      Muscle spasm of right leg - Primary    On and off, will try Robaxin 500mg  q6h prn. Observe.           Family/ staff Communication: here for rehab, goal is to return [place] when able.   Labs/tests ordered:  LFT pending.   [facility] Brindy Higginbotham NP Geriatrics [facility] [phone], [place] [address] On Call:  [phone] & follow prompts after 5pm & weekends Office Phone:  [phone] Office Fax:  [phone]

## 2015-04-07 DIAGNOSIS — Z4789 Encounter for other orthopedic aftercare: Secondary | ICD-10-CM | POA: Diagnosis not present

## 2015-04-07 DIAGNOSIS — S72101D Unspecified trochanteric fracture of right femur, subsequent encounter for closed fracture with routine healing: Secondary | ICD-10-CM | POA: Diagnosis not present

## 2015-04-10 LAB — HEPATIC FUNCTION PANEL
ALK PHOS: 95 U/L (ref 25–125)
ALT: 8 U/L (ref 7–35)
AST: 14 U/L (ref 13–35)
BILIRUBIN, TOTAL: 1 mg/dL

## 2015-04-14 ENCOUNTER — Other Ambulatory Visit: Payer: Self-pay | Admitting: Nurse Practitioner

## 2015-04-14 DIAGNOSIS — E46 Unspecified protein-calorie malnutrition: Secondary | ICD-10-CM

## 2015-04-16 ENCOUNTER — Non-Acute Institutional Stay (SKILLED_NURSING_FACILITY): Payer: Medicare Other | Admitting: Internal Medicine

## 2015-04-16 DIAGNOSIS — S32501D Unspecified fracture of right pubis, subsequent encounter for fracture with routine healing: Secondary | ICD-10-CM | POA: Diagnosis not present

## 2015-04-16 DIAGNOSIS — S72141A Displaced intertrochanteric fracture of right femur, initial encounter for closed fracture: Secondary | ICD-10-CM

## 2015-04-16 DIAGNOSIS — S32591D Other specified fracture of right pubis, subsequent encounter for fracture with routine healing: Secondary | ICD-10-CM

## 2015-04-16 DIAGNOSIS — I10 Essential (primary) hypertension: Secondary | ICD-10-CM

## 2015-04-16 DIAGNOSIS — M150 Primary generalized (osteo)arthritis: Secondary | ICD-10-CM

## 2015-04-16 DIAGNOSIS — M159 Polyosteoarthritis, unspecified: Secondary | ICD-10-CM

## 2015-04-16 DIAGNOSIS — G894 Chronic pain syndrome: Secondary | ICD-10-CM | POA: Diagnosis not present

## 2015-04-16 MED ORDER — OXYCODONE-ACETAMINOPHEN 5-325 MG PO TABS: ORAL_TABLET | ORAL | Status: DC

## 2015-04-16 NOTE — Progress Notes (Signed)
Patient ID: Bailey [name], female   DOB: 1926/10/06, 81 y.o.   MRN: [id number]    [place] Room Number: N 32  Place of Service: SNF (31)     Allergies  Allergen Reactions  . Aspirin     Drop in body temp with large quantities, can tolerate low doses of aspirin  . Penicillins Swelling    Chief Complaint  Patient presents with  . Medical Management of Chronic Issues    HPI:  Fracture of multiple pubic rami, right, with routine healing, subsequent encounter - continues to have discomfort when attempting to stand and walk, but he is making progress with physical therapy.  Fracture, intertrochanteric, right femur, closed, initial encounter ([place]) - residual discomfort in the right ear with standing and walking, but progress has been made.  Essential hypertension - controlled  Primary osteoarthritis involving multiple joints - chronic discomfort in multiple joints including left hip, lower back, knees.  Chronic pain syndrome - even prior to the fracture of the pubic rami and right femur, she was having significant pain problems related to her osteoarthritis and previous fracture of the left hip. She was on regular doses of oxycodone/APAP at home.  Discussed pain management with the patient. She would like to have scheduled medications that as used to take them.    Medications: Patient's Medications  New Prescriptions   No medications on file  Previous Medications   ASPIRIN EC 325 MG EC TABLET    Take 1 tablet (325 mg total) by mouth 2 (two) times daily after a meal.   ATENOLOL (TENORMIN) 25 MG TABLET    Take one tablet by mouth once daily for blood pressure   CHOLECALCIFEROL (VITAMIN D) 1000 UNITS TABLET    Take 3,000 Units by mouth daily.   CYANOCOBALAMIN (VITAMIN B-12 CR PO)    Take 1 tablet by mouth daily.    DENOSUMAB (PROLIA) 60 MG/ML SOLN INJECTION    Inject 60 mg into the skin every 6 (six) months. Administer in upper arm, thigh, or abdomen   DICLOFENAC (VOLTAREN) 50 MG EC TABLET    Take one tablet by mouth twice daily to help arthritis pain   LEVOTHYROXINE (SYNTHROID, LEVOTHROID) 112 MCG TABLET    Take one tablet by mouth once daily for thyroid supplement   LIDOCAINE (LIDODERM) 5 %    Place 1-3 patches onto the skin daily. Apply in AM and remove in PM. Remove & Discard patch within 12 hours or as directed by MD   OXYCODONE-ACETAMINOPHEN (PERCOCET/ROXICET) 5-325 MG TABLET    Take one tablet every 4 hours as needed to control pain   PANTOPRAZOLE (PROTONIX) 40 MG TABLET    Take 1 tablet (40 mg total) by mouth daily.   PROCHLORPERAZINE (COMPAZINE) 10 MG TABLET    Take 1 tablet (10 mg total) by mouth every 6 (six) hours as needed for nausea or vomiting.   ZOLPIDEM (AMBIEN) 10 MG TABLET    Take one tablet by mouth by mouth at bedtime for sleep  Modified Medications   No medications on file  Discontinued Medications   No medications on file     Review of Systems  Constitutional: Negative for fever, chills and diaphoresis.  HENT: Negative for congestion, ear discharge, ear pain, hearing loss, nosebleeds and tinnitus.        Sinus allergies  Eyes: Negative for photophobia and pain.  Respiratory: Positive for shortness of breath (On exertion). Negative for cough and wheezing.  Cardiovascular: Negative for chest pain, palpitations and leg swelling.  Gastrointestinal: Positive for constipation. Negative for nausea, vomiting and abdominal pain.       Flatulence. Hemorrhoids.  Genitourinary: Negative.  Negative for dysuria, urgency and frequency.  Musculoskeletal: Positive for back pain.       History of osteoporosis. Pain in the left knee and both hips. C/o right leg muscle spasm on and off  Skin: Negative.  Negative for rash.       Right hip surgical incision healing nicely Left upper arm indurated area warmth and mild tenderness at the pneumococcal vac site.   Neurological: Negative for dizziness, tremors and headaches.    Psychiatric/Behavioral: Negative for suicidal ideas and hallucinations. The patient is not nervous/anxious.     Filed Vitals:   04/16/15 1419  BP: 110/86  Pulse: 56  Temp: 97.5 F (36.4 C)  Resp: 20  Height: $Remove'5\' 5"'ZrvIRbO$  (1.651 m)  Weight: 135 lb (61.236 kg)  SpO2: 98%   Body mass index is 22.47 kg/(m^2).  Physical Exam  Constitutional: She is oriented to person, place, and time. No distress.  Elderly. Frail. Slow in movement due to back pain.  HENT:  Right Ear: External ear normal.  Left Ear: External ear normal.  Nose: Nose normal.  Mouth/Throat: Oropharynx is clear and moist. No oropharyngeal exudate.  Eyes: Conjunctivae and EOM are normal. Pupils are equal, round, and reactive to light.  Neck: No JVD present. No tracheal deviation present. No thyromegaly present.  Cardiovascular: Normal rate, regular rhythm, normal heart sounds and intact distal pulses.  Exam reveals no gallop and no friction rub.   No murmur heard. Pulmonary/Chest: No respiratory distress. She has no wheezes. She has no rales. She exhibits no tenderness.  Abdominal: She exhibits no distension and no mass. There is no tenderness.  Musculoskeletal: Normal range of motion. She exhibits edema and tenderness (Lumbar area and left knee).  Right hip pain with movement, trace edema R leg  Lymphadenopathy:    She has no cervical adenopathy.  Neurological: She is alert and oriented to person, place, and time. She has normal reflexes.  10/22/2013 MMSE 30/30. Passed clock drawing  Skin: No rash noted. No erythema. No pallor.  Right hip surgical incision healed.  Psychiatric: She has a normal mood and affect. Her behavior is normal. Judgment and thought content normal.     Labs reviewed: Lab Summary Latest Ref Rng 04/10/2015 03/30/2015 03/26/2015 03/23/2015 03/23/2015 03/22/2015 03/21/2015  Hemoglobin 12.0 - 16.0 g/dL (None) 9.9(A) 9.8(A) 9.8(L) 7.5(L) 7.8(L) 8.3(L)  Hematocrit 36 - 46 % (None) 29(A) 29(A) 28.3(L) 22.2(L)  22.6(L) 24.7(L)  White count - (None) 5.2 7.0 (None) 5.7 6.8 (None)  Platelet count 150 - 399 K/L (None) 232 238 (None) 156 139(L) (None)  Sodium 137 - 147 mmol/L (None) (None) 134(A) (None) 135 134(L) (None)  Potassium 3.4 - 5.3 mmol/L (None) (None) 4.1 (None) 3.5 3.6 (None)  Calcium 8.9 - 10.3 mg/dL (None) (None) (None) (None) 7.3(L) 7.2(L) (None)  Phosphorus - (None) (None) (None) (None) (None) (None) (None)  Creatinine 0.5 - 1.1 mg/dL (None) (None) 1.4(A) (None) 1.16(H) 0.98 (None)  AST 13 - 35 U/L 14 (None) 15 (None) (None) (None) (None)  Alk Phos 25 - 125 U/L 95 (None) 41 (None) (None) (None) (None)  Bilirubin - (None) (None) (None) (None) (None) (None) (None)  Glucose - (None) (None) 96 (None) 104(H) 109(H) (None)  Cholesterol - (None) (None) (None) (None) (None) (None) (None)  HDL cholesterol - (None) (None) (None) (None) (  None) (None) (None)  Triglycerides - (None) (None) (None) (None) (None) (None) (None)  LDL Direct - (None) (None) (None) (None) (None) (None) (None)  LDL Calc - (None) (None) (None) (None) (None) (None) (None)  Total protein - (None) (None) (None) (None) (None) (None) (None)  Albumin - (None) (None) (None) (None) (None) (None) (None)   Lab Results  Component Value Date   TSH 0.613 03/19/2015   Lab Results  Component Value Date   BUN 24* 03/26/2015   No results found for: HGBA1C     Assessment/Plan  1. Fracture of multiple pubic rami, right, with routine healing, subsequent encounter Continues with pain, but is more mobile than she was a few weeks ago. - oxyCODONE-acetaminophen (PERCOCET/ROXICET) 5-325 MG tablet; Take one tablet at 8 AM, 1 PM, 6 PM, and 10 PM. May have additional tablet between the hours of 1 AM and 7 AM if she wakens with pain.  Dispense: 120 tablet; Refill: 0  2. Fracture, intertrochanteric, right femur, closed, initial encounter ([place]) Continues with pain, but is more mobile than previously. - oxyCODONE-acetaminophen  (PERCOCET/ROXICET) 5-325 MG tablet; Take one tablet at 8 AM, 1 PM, 6 PM, and 10 PM. May have additional tablet between the hours of 1 AM and 7 AM if she wakens with pain.  Dispense: 120 tablet; Refill: 0  3. Essential hypertension Controlled  4. Primary osteoarthritis involving multiple joints Significant discomfort and back and knees as well as both hips and right pubic rami  5. Chronic pain syndrome Orders written for scheduled dosing on a regular basis as opposed to when necessary dosing. - oxyCODONE-acetaminophen (PERCOCET/ROXICET) 5-325 MG tablet; Take one tablet at 8 AM, 1 PM, 6 PM, and 10 PM. May have additional tablet between the hours of 1 AM and 7 AM if she wakens with pain.  Dispense: 120 tablet; Refill: 0

## 2015-04-20 LAB — CBC AND DIFFERENTIAL
HCT: 31 % — AB (ref 36–46)
HEMOGLOBIN: 10.5 g/dL — AB (ref 12.0–16.0)
Platelets: 158 10*3/uL (ref 150–399)
WBC: 4.3 10*3/mL

## 2015-04-20 LAB — BASIC METABOLIC PANEL
BUN: 23 mg/dL — AB (ref 4–21)
Creatinine: 1.1 mg/dL (ref 0.5–1.1)
POTASSIUM: 4.4 mmol/L (ref 3.4–5.3)
SODIUM: 135 mmol/L — AB (ref 137–147)

## 2015-04-21 ENCOUNTER — Non-Acute Institutional Stay (SKILLED_NURSING_FACILITY): Payer: Medicare Other | Admitting: Nurse Practitioner

## 2015-04-21 ENCOUNTER — Encounter: Payer: Self-pay | Admitting: Nurse Practitioner

## 2015-04-21 DIAGNOSIS — I10 Essential (primary) hypertension: Secondary | ICD-10-CM

## 2015-04-21 DIAGNOSIS — G4700 Insomnia, unspecified: Secondary | ICD-10-CM

## 2015-04-21 DIAGNOSIS — S32501S Unspecified fracture of right pubis, sequela: Secondary | ICD-10-CM | POA: Diagnosis not present

## 2015-04-21 DIAGNOSIS — K5900 Constipation, unspecified: Secondary | ICD-10-CM

## 2015-04-21 DIAGNOSIS — S32591S Other specified fracture of right pubis, sequela: Secondary | ICD-10-CM

## 2015-04-21 DIAGNOSIS — E039 Hypothyroidism, unspecified: Secondary | ICD-10-CM | POA: Diagnosis not present

## 2015-04-21 DIAGNOSIS — D62 Acute posthemorrhagic anemia: Secondary | ICD-10-CM

## 2015-04-21 DIAGNOSIS — M62838 Other muscle spasm: Secondary | ICD-10-CM

## 2015-04-21 DIAGNOSIS — K219 Gastro-esophageal reflux disease without esophagitis: Secondary | ICD-10-CM | POA: Diagnosis not present

## 2015-04-21 DIAGNOSIS — N182 Chronic kidney disease, stage 2 (mild): Secondary | ICD-10-CM | POA: Diagnosis not present

## 2015-04-21 NOTE — Assessment & Plan Note (Signed)
Stable, continue Zolpidem 5mg  nightly per pharm recommendation.

## 2015-04-21 NOTE — Assessment & Plan Note (Signed)
Stable, continue Pantoprazole 40mg daily.   

## 2015-04-21 NOTE — Assessment & Plan Note (Signed)
Stable, continue MiraLax 1/2 17gm +4 Oz fluid daily.

## 2015-04-21 NOTE — Assessment & Plan Note (Signed)
04/20/15 Bun/creatinine 23/1.14

## 2015-04-21 NOTE — Assessment & Plan Note (Signed)
Anemia panel consistent with anemia of chronic disease. Hemoglobin trended down from 11.8 on hospital admission to 7.5. Patient was transfused 1 unit PRBCs. 03/26/15 Hgb 9.8, 9.9 03/30/15, 04/20/15 10.5, improving

## 2015-04-21 NOTE — Assessment & Plan Note (Signed)
elevated, will dc atenolol, try Carvedilol 3.125mg  bid.  Norvasc, Avapro were discontinued during hospital stay.

## 2015-04-21 NOTE — Progress Notes (Signed)
Patient ID: Bailey [name], female   DOB: 06-26-1926, 81 y.o.   MRN: [id number]  Location:  SNF [facility] Provider:  Marlana Latus NP  Code Status:  DNR Goals of care: Advanced Directive information    Chief Complaint  Patient presents with  . Medical Management of Chronic Issues  . Acute Visit    blood pressure     HPI: Patient is a 81 y.o. female seen in the SNF at [facility] today for evaluation of elevated blood pressure, range from Sbp 135-187, Dbp 68-90, Hr 55-64, presently takign Atenolol 25mg  qd. She denied chest pain, palpitation, HA, blurred vision, or dizziness. 04/20/15 CBC and BMP unremarkable.   UTI, urine culture 03/25/15 Providencia Stuartii, 7 day course of Septra DS started 03/25/15, tolerated.  Post op anemia, Hgb 9.8 03/25/14.   hospital stay 03/18/15-03/23/15 for comminuted right peritrochateric femur fracture/ mildly displaced fracture junction of right superior pubic ramus and acetabulum/ Mildly displaced fracture right inferior pubic ramus. Patient status post supplementary fixation right peritrochaneric femur fracture per Dr. Lyla Glassing 03/19/2015. follow-up with orthjopedics in 2 weeks.   Review of Systems:  Review of Systems  Constitutional: Positive for malaise/fatigue. Negative for fever, chills, weight loss and diaphoresis.  HENT: Negative for congestion, ear discharge, ear pain, hearing loss, nosebleeds and tinnitus.        Sinus allergies  Eyes: Negative for blurred vision, double vision, photophobia and pain.  Respiratory: Positive for shortness of breath (On exertion). Negative for cough and wheezing.   Cardiovascular: Negative for chest pain, palpitations and leg swelling.  Gastrointestinal: Positive for constipation. Negative for heartburn, nausea, vomiting and abdominal pain.       Flatulence. Hemorrhoids.  Genitourinary: Negative.  Negative for dysuria, urgency and frequency.  Musculoskeletal: Positive for back pain and joint pain.       History of  osteoporosis. Pain in the left knee and both hips. C/o right leg muscle spasm on and off  Skin: Negative.  Negative for itching and rash.       Right hip surgical incision healing nicely Left upper arm indurated area warmth and mild tenderness at the pneumococcal vac site.   Neurological: Negative for dizziness, tremors, sensory change, speech change, focal weakness and headaches.  Psychiatric/Behavioral: Negative for depression, suicidal ideas, hallucinations, memory loss and substance abuse. The patient has insomnia. The patient is not nervous/anxious.     Past Medical History  Diagnosis Date  . Osteoarthrosis, unspecified whether generalized or localized, forearm     bilaterally wrist  . Allergic rhinitis, cause unspecified   . Senile osteoporosis   . Malignant neoplasm of breast (female), unspecified site 1990    left. Had surgerey and chemo, but no radiation  . Acquired cyst of kidney     left  . 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 Anemia, unspecified   . Unspecified gastritis and gastroduodenitis without mention of hemorrhage   . 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 Other and unspecified nonspecific immunological findings     positive ANA  . History of Epstein-Barr virus infection   . Herpes simplex without mention of complication   . Insomnia, unspecified   . Unspecified hypothyroidism   . Hyposmolality and/or hyponatremia   . Unspecified vitamin D deficiency   . Pure hypercholesterolemia   . Other B-complex deficiencies   . Osteoarthrosis of knee     bilaterally  .  Closed fracture of unspecified part of vertebral column without mention of spinal cord injury     T7 s/p kyphoplasty, T12  . Unspecified essential hypertension   . Pain in joint, site unspecified     severe, diffuse, chronic pain  . Osteoarthrosis, hip   . Fracture of left hip ([place]) 06/13/2007  . Edema  2015  . CKD stage 2 due to type 2 diabetes mellitus ([place]) 11/18/2014  . Neuropathy ([place])   . Positive ANA (antinuclear antibody)   . Insomnia     Patient Active Problem List   Diagnosis Date Noted  . Chronic pain syndrome 04/16/2015  . Muscle spasm of right leg 03/31/2015  . Fracture of multiple pubic rami ([place]) 03/30/2015  . UTI (urinary tract infection) 03/27/2015  . Protein-calorie malnutrition ([place]) 03/27/2015  . Bilirubinemia 03/27/2015  . GERD (gastroesophageal reflux disease) 03/24/2015  . Constipation 03/24/2015  . Postoperative anemia due to acute blood loss 03/19/2015  . Fracture, intertrochanteric, right femur ([place])   . CKD (chronic kidney disease) stage 2, GFR 60-89 ml/min 11/18/2014  . Osteoarthritis 03/25/2014  . Osteoarthrosis, forearm   . Senile osteoporosis   . Malignant neoplasm of breast (female), unspecified site   . Anemia   . Insomnia   . Hypothyroidism   . Pure hypercholesterolemia   . Closed fracture of unspecified part of vertebral column without mention of spinal cord injury   . Essential hypertension   . Osteoarthrosis, hip   . Edema     Allergies  Allergen Reactions  . Aspirin     Drop in body temp with large quantities, can tolerate low doses of aspirin  . Penicillins Swelling    Medications: Patient's Medications  New Prescriptions   No medications on file  Previous Medications   ASPIRIN EC 325 MG EC TABLET    Take 1 tablet (325 mg total) by mouth 2 (two) times daily after a meal.   ATENOLOL (TENORMIN) 25 MG TABLET    Take one tablet by mouth once daily for blood pressure   CHOLECALCIFEROL (VITAMIN D) 1000 UNITS TABLET    Take 3,000 Units by mouth daily.   CYANOCOBALAMIN (VITAMIN B-12 CR PO)    Take 1 tablet by mouth daily.    DENOSUMAB (PROLIA) 60 MG/ML SOLN INJECTION    Inject 60 mg into the skin every 6 (six) months. Administer in upper arm, thigh, or abdomen   DICLOFENAC (VOLTAREN) 50 MG EC TABLET    Take one tablet by mouth twice  daily to help arthritis pain   LEVOTHYROXINE (SYNTHROID, LEVOTHROID) 112 MCG TABLET    Take one tablet by mouth once daily for thyroid supplement   LIDOCAINE (LIDODERM) 5 %    Place 1-3 patches onto the skin daily. Apply in AM and remove in PM. Remove & Discard patch within 12 hours or as directed by MD   OXYCODONE-ACETAMINOPHEN (PERCOCET/ROXICET) 5-325 MG TABLET    Take one tablet at 8 AM, 1 PM, 6 PM, and 10 PM. May have additional tablet between the hours of 1 AM and 7 AM if she wakens with pain.   PANTOPRAZOLE (PROTONIX) 40 MG TABLET    Take 1 tablet (40 mg total) by mouth daily.   PROCHLORPERAZINE (COMPAZINE) 10 MG TABLET    Take 1 tablet (10 mg total) by mouth every 6 (six) hours as needed for nausea or vomiting.   ZOLPIDEM (AMBIEN) 10 MG TABLET    Take one tablet by mouth by mouth at bedtime for sleep  Modified Medications   No medications on file  Discontinued Medications   No medications on file    Physical Exam: Filed Vitals:   04/21/15 0956  BP: 187/88  Pulse: 64  Temp: 98.1 F (36.7 C)  TempSrc: Tympanic  Resp: 16   There is no weight on file to calculate BMI.  Physical Exam  Constitutional: She is oriented to person, place, and time. No distress.  Elderly. Frail. Slow in movement due to back pain.  HENT:  Right Ear: External ear normal.  Left Ear: External ear normal.  Nose: Nose normal.  Mouth/Throat: Oropharynx is clear and moist. No oropharyngeal exudate.  Eyes: Conjunctivae and EOM are normal. Pupils are equal, round, and reactive to light.  Neck: No JVD present. No tracheal deviation present. No thyromegaly present.  Cardiovascular: Normal rate, regular rhythm, normal heart sounds and intact distal pulses.  Exam reveals no gallop and no friction rub.   No murmur heard. Pulmonary/Chest: No respiratory distress. She has no wheezes. She has no rales. She exhibits no tenderness.  Abdominal: She exhibits no distension and no mass. There is no tenderness.    Musculoskeletal: Normal range of motion. She exhibits edema and tenderness (Lumbar area and left knee).  Right hip pain with movement, very trace edema R leg  Lymphadenopathy:    She has no cervical adenopathy.  Neurological: She is alert and oriented to person, place, and time. She has normal reflexes.  10/22/2013 MMSE 30/30. Passed clock drawing  Skin: No rash noted. No erythema. No pallor.  Right hip surgical incision healing nicely. Left upper arm indurated area warmth and mild tenderness at the pneumococcal vac site.     Psychiatric: She has a normal mood and affect. Her behavior is normal. Judgment and thought content normal.    Labs reviewed: Basic Metabolic Panel:  Recent Labs  03/19/15 0415  03/21/15 0425 03/22/15 0444 03/23/15 0423 03/26/15 04/20/15  NA 135  < > 135 134* 135 134* 135*  K 4.2  < > 3.5 3.6 3.5 4.1 4.4  CL 107  < > 108 104 105  --   --   CO2 22  < > 23 22 24   --   --   GLUCOSE 170*  < > 112* 109* 104*  --   --   BUN 24*  < > 18 15 17  24* 23*  CREATININE 1.05*  < > 0.96 0.98 1.16* 1.4* 1.1  CALCIUM 7.9*  < > 7.1* 7.2* 7.3*  --   --   MG 2.0  --   --   --   --   --   --   < > = values in this interval not displayed.  Liver Function Tests:  Recent Labs  11/10/14 03/26/15 04/10/15  AST 16 15 14   ALT 11 11 8   ALKPHOS 50 41 95    CBC:  Recent Labs  03/18/15 0237  03/21/15 0425  03/22/15 0444 03/23/15 0423  03/26/15 03/30/15 04/20/15  WBC 9.9  < > 5.8  --  6.8 5.7  --  7.0 5.2 4.3  NEUTROABS 8.0*  --   --   --   --   --   --   --   --   --   HGB 11.8*  < > 7.5*  < > 7.8* 7.5*  < > 9.8* 9.9* 10.5*  HCT 34.1*  < > 21.9*  < > 22.6* 22.2*  < > 29* 29* 31*  MCV 92.9  < >  93.2  --  94.2 93.7  --   --   --   --   PLT 165  < > 108*  --  139* 156  --  238 232 158  < > = values in this interval not displayed.  Lab Results  Component Value Date   TSH 0.613 03/19/2015   No results found for: HGBA1C Lab Results  Component Value Date   CHOL 175  11/10/2014   HDL 53 11/10/2014   LDLCALC 87 11/10/2014   TRIG 173* 11/10/2014    Significant Diagnostic Results since last visit: none  Patient Care Team: Estill Dooms, MD as PCP - General (Internal Medicine) Jediah Horger X, NP as Nurse Practitioner (Nurse Practitioner)  Assessment/Plan Problem List Items Addressed This Visit    Hypothyroidism (Chronic)    03/09/15 TSH 2.62, continue Synthroid 185mcg.       Essential hypertension - Primary (Chronic)    elevated, will dc atenolol, try Carvedilol 3.125mg  bid.  Norvasc, Avapro were discontinued during hospital stay.       Postoperative anemia due to acute blood loss (Chronic)    Anemia panel consistent with anemia of chronic disease. Hemoglobin trended down from 11.8 on hospital admission to 7.5. Patient was transfused 1 unit PRBCs. 03/26/15 Hgb 9.8, 9.9 03/30/15, 04/20/15 10.5, improving       Fracture of multiple pubic rami ([facility]) (Chronic)    Healing and improving.       Insomnia    Stable, continue Zolpidem 5mg  nightly per pharm recommendation.       CKD (chronic kidney disease) stage 2, GFR 60-89 ml/min    04/20/15 Bun/creatinine 23/1.14      GERD (gastroesophageal reflux disease)    Stable, continue Pantoprazole 40mg  daily.       Constipation    Stable, continue MiraLax 1/2 17gm +4 Oz fluid daily.       Muscle spasm of right leg     On and off, continue Robaxin 500mg  q6h prn. Observe.           Family/ staff Communication: here for rehab, [place] is to return [place] when [place].   Labs/tests ordered: none  ManXie Amelianna Meller NP [facility] [address], [place] [address] On Call:  [phone] & follow prompts after 5pm & weekends Office Phone:  [phone] Office Fax:  [phone]

## 2015-04-21 NOTE — Assessment & Plan Note (Signed)
On and off, continue Robaxin 500mg  q6h prn. Observe.

## 2015-04-21 NOTE — Assessment & Plan Note (Signed)
Healing and improving.

## 2015-04-21 NOTE — Assessment & Plan Note (Signed)
03/09/15 TSH 2.62, continue Synthroid 112mcg.  

## 2015-05-05 DIAGNOSIS — S72101D Unspecified trochanteric fracture of right femur, subsequent encounter for closed fracture with routine healing: Secondary | ICD-10-CM | POA: Diagnosis not present

## 2015-05-05 DIAGNOSIS — Z4789 Encounter for other orthopedic aftercare: Secondary | ICD-10-CM | POA: Diagnosis not present

## 2015-05-05 DIAGNOSIS — M1712 Unilateral primary osteoarthritis, left knee: Secondary | ICD-10-CM | POA: Diagnosis not present

## 2015-05-07 ENCOUNTER — Encounter: Payer: Self-pay | Admitting: Internal Medicine

## 2015-05-07 ENCOUNTER — Non-Acute Institutional Stay: Payer: Medicare Other | Admitting: Internal Medicine

## 2015-05-07 ENCOUNTER — Emergency Department (HOSPITAL_COMMUNITY): Payer: Medicare Other

## 2015-05-07 ENCOUNTER — Inpatient Hospital Stay (HOSPITAL_COMMUNITY)
Admission: EM | Admit: 2015-05-07 | Discharge: 2015-05-09 | DRG: 176 | Disposition: A | Discharge status: Skilled Nursing Facility | Payer: Medicare Other | Attending: Internal Medicine | Admitting: Internal Medicine

## 2015-05-07 ENCOUNTER — Encounter (HOSPITAL_COMMUNITY): Payer: Self-pay | Admitting: Emergency Medicine

## 2015-05-07 DIAGNOSIS — E1140 Type 2 diabetes mellitus with diabetic neuropathy, unspecified: Secondary | ICD-10-CM | POA: Diagnosis present

## 2015-05-07 DIAGNOSIS — N182 Chronic kidney disease, stage 2 (mild): Secondary | ICD-10-CM | POA: Diagnosis present

## 2015-05-07 DIAGNOSIS — Z87891 Personal history of nicotine dependence: Secondary | ICD-10-CM

## 2015-05-07 DIAGNOSIS — Z9221 Personal history of antineoplastic chemotherapy: Secondary | ICD-10-CM | POA: Diagnosis not present

## 2015-05-07 DIAGNOSIS — M810 Age-related osteoporosis without current pathological fracture: Secondary | ICD-10-CM | POA: Diagnosis present

## 2015-05-07 DIAGNOSIS — G894 Chronic pain syndrome: Secondary | ICD-10-CM

## 2015-05-07 DIAGNOSIS — I2699 Other pulmonary embolism without acute cor pulmonale: Secondary | ICD-10-CM | POA: Diagnosis not present

## 2015-05-07 DIAGNOSIS — E039 Hypothyroidism, unspecified: Secondary | ICD-10-CM

## 2015-05-07 DIAGNOSIS — S72141D Displaced intertrochanteric fracture of right femur, subsequent encounter for closed fracture with routine healing: Secondary | ICD-10-CM | POA: Diagnosis not present

## 2015-05-07 DIAGNOSIS — I10 Essential (primary) hypertension: Secondary | ICD-10-CM

## 2015-05-07 DIAGNOSIS — Z7982 Long term (current) use of aspirin: Secondary | ICD-10-CM | POA: Diagnosis not present

## 2015-05-07 DIAGNOSIS — R0602 Shortness of breath: Secondary | ICD-10-CM

## 2015-05-07 DIAGNOSIS — G4700 Insomnia, unspecified: Secondary | ICD-10-CM | POA: Diagnosis present

## 2015-05-07 DIAGNOSIS — R079 Chest pain, unspecified: Secondary | ICD-10-CM

## 2015-05-07 DIAGNOSIS — W19XXXD Unspecified fall, subsequent encounter: Secondary | ICD-10-CM | POA: Diagnosis present

## 2015-05-07 DIAGNOSIS — Z66 Do not resuscitate: Secondary | ICD-10-CM | POA: Diagnosis present

## 2015-05-07 DIAGNOSIS — S32591D Other specified fracture of right pubis, subsequent encounter for fracture with routine healing: Secondary | ICD-10-CM

## 2015-05-07 DIAGNOSIS — E7800 Pure hypercholesterolemia, unspecified: Secondary | ICD-10-CM | POA: Diagnosis present

## 2015-05-07 DIAGNOSIS — Z79899 Other long term (current) drug therapy: Secondary | ICD-10-CM | POA: Diagnosis not present

## 2015-05-07 DIAGNOSIS — K219 Gastro-esophageal reflux disease without esophagitis: Secondary | ICD-10-CM | POA: Diagnosis present

## 2015-05-07 DIAGNOSIS — I2609 Other pulmonary embolism with acute cor pulmonale: Secondary | ICD-10-CM | POA: Diagnosis not present

## 2015-05-07 DIAGNOSIS — Z853 Personal history of malignant neoplasm of breast: Secondary | ICD-10-CM | POA: Diagnosis not present

## 2015-05-07 DIAGNOSIS — E038 Other specified hypothyroidism: Secondary | ICD-10-CM | POA: Diagnosis not present

## 2015-05-07 DIAGNOSIS — S72141A Displaced intertrochanteric fracture of right femur, initial encounter for closed fracture: Secondary | ICD-10-CM | POA: Diagnosis not present

## 2015-05-07 DIAGNOSIS — I129 Hypertensive chronic kidney disease with stage 1 through stage 4 chronic kidney disease, or unspecified chronic kidney disease: Secondary | ICD-10-CM | POA: Diagnosis present

## 2015-05-07 DIAGNOSIS — E1122 Type 2 diabetes mellitus with diabetic chronic kidney disease: Secondary | ICD-10-CM | POA: Diagnosis present

## 2015-05-07 DIAGNOSIS — N183 Chronic kidney disease, stage 3 (moderate): Secondary | ICD-10-CM

## 2015-05-07 HISTORY — DX: Other pulmonary embolism without acute cor pulmonale: I26.99

## 2015-05-07 LAB — CBC
HEMATOCRIT: 35.5 % — AB (ref 36.0–46.0)
HEMOGLOBIN: 12.2 g/dL (ref 12.0–15.0)
MCH: 33.2 pg (ref 26.0–34.0)
MCHC: 34.4 g/dL (ref 30.0–36.0)
MCV: 96.7 fL (ref 78.0–100.0)
Platelets: 175 10*3/uL (ref 150–400)
RBC: 3.67 MIL/uL — ABNORMAL LOW (ref 3.87–5.11)
RDW: 12.6 % (ref 11.5–15.5)
WBC: 7.7 10*3/uL (ref 4.0–10.5)

## 2015-05-07 LAB — BASIC METABOLIC PANEL
ANION GAP: 10 (ref 5–15)
BUN: 22 mg/dL — ABNORMAL HIGH (ref 6–20)
CALCIUM: 8.8 mg/dL — AB (ref 8.9–10.3)
CHLORIDE: 100 mmol/L — AB (ref 101–111)
CO2: 22 mmol/L (ref 22–32)
Creatinine, Ser: 1.05 mg/dL — ABNORMAL HIGH (ref 0.44–1.00)
GFR calc non Af Amer: 47 mL/min — ABNORMAL LOW (ref >60)
GFR, EST AFRICAN AMERICAN: 54 mL/min — AB (ref >60)
GLUCOSE: 106 mg/dL — AB (ref 65–99)
POTASSIUM: 4.7 mmol/L (ref 3.5–5.1)
Sodium: 132 mmol/L — ABNORMAL LOW (ref 135–145)

## 2015-05-07 LAB — TROPONIN I

## 2015-05-07 LAB — TSH: TSH: 1.137 u[IU]/mL (ref 0.350–4.500)

## 2015-05-07 LAB — I-STAT TROPONIN, ED
TROPONIN I, POC: 0 ng/mL (ref 0.00–0.08)
Troponin i, poc: 0 ng/mL (ref 0.00–0.08)

## 2015-05-07 MED ORDER — AMLODIPINE BESYLATE 5 MG PO TABS: ORAL_TABLET | ORAL | Status: DC

## 2015-05-07 MED ORDER — SODIUM CHLORIDE 0.9 % IJ SOLN
3.0000 mL | Freq: Two times a day (BID) | INTRAMUSCULAR | Status: DC
  Administered 2015-05-07: 3 mL via INTRAVENOUS

## 2015-05-07 MED ORDER — IPRATROPIUM BROMIDE 0.02 % IN SOLN: 0.5000 mg | RESPIRATORY_TRACT | Status: DC | PRN

## 2015-05-07 MED ORDER — ACETAMINOPHEN 650 MG RE SUPP: 650.0000 mg | Freq: Four times a day (QID) | RECTAL | Status: DC | PRN

## 2015-05-07 MED ORDER — LIDOCAINE 5 % EX PTCH
3.0000 | MEDICATED_PATCH | CUTANEOUS | Status: DC
  Administered 2015-05-08 – 2015-05-09 (×2): 3 via TRANSDERMAL
  Filled 2015-05-07 (×2): qty 3

## 2015-05-07 MED ORDER — ALUM & MAG HYDROXIDE-SIMETH 200-200-20 MG/5ML PO SUSP: 30.0000 mL | Freq: Four times a day (QID) | ORAL | Status: DC | PRN

## 2015-05-07 MED ORDER — OMEPRAZOLE 20 MG PO CPDR: 20.0000 mg | DELAYED_RELEASE_CAPSULE | Freq: Every day | ORAL | Status: DC

## 2015-05-07 MED ORDER — ATENOLOL 50 MG PO TABS
25.0000 mg | ORAL_TABLET | Freq: Every day | ORAL | Status: DC
  Administered 2015-05-07 – 2015-05-09 (×3): 25 mg via ORAL
  Filled 2015-05-07 (×3): qty 1

## 2015-05-07 MED ORDER — ONDANSETRON HCL 4 MG PO TABS: 4.0000 mg | ORAL_TABLET | Freq: Four times a day (QID) | ORAL | Status: DC | PRN

## 2015-05-07 MED ORDER — HEPARIN (PORCINE) IN NACL 100-0.45 UNIT/ML-% IJ SOLN: 14.0000 [IU]/kg/h | Freq: Once | INTRAMUSCULAR | Status: DC

## 2015-05-07 MED ORDER — SODIUM CHLORIDE 0.9 % IV BOLUS (SEPSIS)
250.0000 mL | Freq: Once | INTRAVENOUS | Status: AC
  Administered 2015-05-07: 250 mL via INTRAVENOUS

## 2015-05-07 MED ORDER — HEPARIN BOLUS VIA INFUSION
3500.0000 [IU] | Freq: Once | INTRAVENOUS | Status: AC
  Administered 2015-05-07: 3500 [IU] via INTRAVENOUS
  Filled 2015-05-07: qty 3500

## 2015-05-07 MED ORDER — ZOLPIDEM TARTRATE 5 MG PO TABS
5.0000 mg | ORAL_TABLET | Freq: Every evening | ORAL | Status: DC | PRN
  Administered 2015-05-07 – 2015-05-08 (×2): 5 mg via ORAL
  Filled 2015-05-07 (×3): qty 1

## 2015-05-07 MED ORDER — SODIUM CHLORIDE 0.9 % IV SOLN
INTRAVENOUS | Status: DC
  Administered 2015-05-07 – 2015-05-09 (×5): via INTRAVENOUS

## 2015-05-07 MED ORDER — HYDROMORPHONE HCL 1 MG/ML IJ SOLN
1.0000 mg | INTRAMUSCULAR | Status: DC | PRN
  Administered 2015-05-07 – 2015-05-09 (×3): 1 mg via INTRAVENOUS
  Filled 2015-05-07 (×3): qty 1

## 2015-05-07 MED ORDER — ACETAMINOPHEN 325 MG PO TABS: 650.0000 mg | ORAL_TABLET | Freq: Four times a day (QID) | ORAL | Status: DC | PRN

## 2015-05-07 MED ORDER — DICLOFENAC SODIUM 50 MG PO TBEC
50.0000 mg | DELAYED_RELEASE_TABLET | Freq: Two times a day (BID) | ORAL | Status: DC
  Administered 2015-05-07 – 2015-05-09 (×4): 50 mg via ORAL
  Filled 2015-05-07 (×5): qty 1

## 2015-05-07 MED ORDER — IRBESARTAN 300 MG PO TABS
300.0000 mg | ORAL_TABLET | Freq: Every day | ORAL | Status: DC
  Administered 2015-05-08 – 2015-05-09 (×2): 300 mg via ORAL
  Filled 2015-05-07 (×2): qty 1

## 2015-05-07 MED ORDER — OXYCODONE-ACETAMINOPHEN 5-325 MG PO TABS
1.0000 | ORAL_TABLET | Freq: Four times a day (QID) | ORAL | Status: DC | PRN
  Administered 2015-05-08 – 2015-05-09 (×4): 1 via ORAL
  Filled 2015-05-07 (×4): qty 1

## 2015-05-07 MED ORDER — OXYCODONE-ACETAMINOPHEN 5-325 MG PO TABS
1.0000 | ORAL_TABLET | Freq: Once | ORAL | Status: AC
  Administered 2015-05-07: 1 via ORAL
  Filled 2015-05-07: qty 1

## 2015-05-07 MED ORDER — ALBUTEROL SULFATE (2.5 MG/3ML) 0.083% IN NEBU: 2.5000 mg | INHALATION_SOLUTION | RESPIRATORY_TRACT | Status: DC | PRN

## 2015-05-07 MED ORDER — HEPARIN (PORCINE) IN NACL 100-0.45 UNIT/ML-% IJ SOLN
1000.0000 [IU]/h | INTRAMUSCULAR | Status: DC
  Administered 2015-05-07: 1000 [IU]/h via INTRAVENOUS
  Filled 2015-05-07: qty 250

## 2015-05-07 MED ORDER — LIDOCAINE 5 % EX PTCH
1.0000 | MEDICATED_PATCH | CUTANEOUS | Status: DC
  Filled 2015-05-07: qty 1

## 2015-05-07 MED ORDER — IPRATROPIUM BROMIDE 0.02 % IN SOLN
0.5000 mg | Freq: Four times a day (QID) | RESPIRATORY_TRACT | Status: DC
  Administered 2015-05-07: 0.5 mg via RESPIRATORY_TRACT
  Filled 2015-05-07: qty 2.5

## 2015-05-07 MED ORDER — IRBESARTAN 300 MG PO TABS: ORAL_TABLET | ORAL | Status: DC

## 2015-05-07 MED ORDER — AMLODIPINE BESYLATE 5 MG PO TABS
5.0000 mg | ORAL_TABLET | Freq: Every day | ORAL | Status: DC
  Administered 2015-05-08 – 2015-05-09 (×2): 5 mg via ORAL
  Filled 2015-05-07 (×2): qty 1

## 2015-05-07 MED ORDER — HEPARIN SODIUM (PORCINE) 5000 UNIT/ML IJ SOLN: 60.0000 [IU]/kg | Freq: Once | INTRAMUSCULAR | Status: DC

## 2015-05-07 MED ORDER — LEVOTHYROXINE SODIUM 112 MCG PO TABS
112.0000 ug | ORAL_TABLET | Freq: Every day | ORAL | Status: DC
  Administered 2015-05-08 – 2015-05-09 (×2): 112 ug via ORAL
  Filled 2015-05-07 (×2): qty 1

## 2015-05-07 MED ORDER — ONDANSETRON HCL 4 MG/2ML IJ SOLN
4.0000 mg | Freq: Four times a day (QID) | INTRAMUSCULAR | Status: DC | PRN
Start: 2015-05-07 — End: 2015-05-09

## 2015-05-07 MED ORDER — PANTOPRAZOLE SODIUM 40 MG PO TBEC
40.0000 mg | DELAYED_RELEASE_TABLET | Freq: Every day | ORAL | Status: DC
  Administered 2015-05-07 – 2015-05-09 (×3): 40 mg via ORAL
  Filled 2015-05-07 (×3): qty 1

## 2015-05-07 MED ORDER — IOHEXOL 350 MG/ML SOLN
100.0000 mL | Freq: Once | INTRAVENOUS | Status: AC | PRN
  Administered 2015-05-07: 100 mL via INTRAVENOUS

## 2015-05-07 MED ORDER — VITAMIN D 1000 UNITS PO TABS
3000.0000 [IU] | ORAL_TABLET | Freq: Every day | ORAL | Status: DC
  Administered 2015-05-08 – 2015-05-09 (×2): 3000 [IU] via ORAL
  Filled 2015-05-07 (×2): qty 3

## 2015-05-07 NOTE — H&P (Signed)
History and Physical        Hospital Admission Note Date: 05/07/2015  Patient name: Bailey [name] Medical record number: [id number] Date of birth: March 27, 1927 Age: 81 y.o. Gender: female  PCP: GREEN, [name], MD  Referring physician: Dr Rogene Houston  Chief Complaint:  Chest pain  HPI: Patient is a 81 year old female with osteoarthritis, hypertension, hyperlipidemia, chronic anemia, CKD stage II, who is currently in a rehabilitation facility for recent right femur fracture status post repair on 03/18/15 presented to ED with acute onset of the chest pain. History was obtained from the patient who reported that she had sudden onset of shortness of breath and chest pain. She described the chest pain as substernal radiating to the back in the interscapular area. BP was checked and was noted to be down to 105/64 with tachycardia. O2 sats were 98% on room air. Patient was seen by her PCP and was found to be pale and mildly diaphoretic. Patient was sent to the ED for further workup. At the time of my encounter chest pain and shortness of breath is completely resolved. ED workup showed CT angiogram of the chest with right lower lobe pulmonary emboli without significant right heart strain. CBC, BMET unremarkable, troponin 2 negative. Patient denies any recent leg swelling or worsening pain.  Review of Systems:  Constitutional: Denies fever, chills, diaphoresis, poor appetite and fatigue.  HEENT: Denies photophobia, eye pain, redness, hearing loss, ear pain, congestion, sore throat, rhinorrhea, sneezing, mouth sores, trouble swallowing, neck pain, neck stiffness and tinnitus.   Respiratory: Please see history of present illness Cardiovascular: Please see history of present illness Gastrointestinal: Denies nausea, vomiting, abdominal pain, diarrhea, constipation, blood in stool and abdominal  distention.  Genitourinary: Denies dysuria, urgency, frequency, hematuria, flank pain and difficulty urinating.  Musculoskeletal: Denies myalgias, back pain, joint swelling, arthralgias, + gait problem due to undergoing rehabilitation.  Skin: Denies pallor, rash and wound.  Neurological: Denies dizziness, seizures, syncope, weakness, light-headedness, numbness and headaches.  Hematological: Denies adenopathy. Easy bruising, personal or family bleeding history  Psychiatric/Behavioral: Denies suicidal ideation, mood changes, confusion, nervousness, sleep disturbance and agitation  Past Medical History: Past Medical History  Diagnosis Date  . Osteoarthrosis, unspecified whether generalized or localized, forearm     bilaterally wrist  . Allergic rhinitis, cause unspecified   . Senile osteoporosis   . Malignant neoplasm of breast (female), unspecified site 1990    left. Had surgerey and chemo, but no radiation  . Acquired cyst of kidney     left  . 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 Anemia, unspecified   . Unspecified gastritis and gastroduodenitis without mention of hemorrhage   . 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 Other and unspecified nonspecific immunological findings     positive ANA  . History of Epstein-Barr virus infection   . Herpes simplex without mention of complication   . Insomnia, unspecified   . Unspecified hypothyroidism   . Hyposmolality and/or hyponatremia   . Unspecified vitamin D deficiency   . Pure hypercholesterolemia   . Other B-complex deficiencies   .  Osteoarthrosis of knee     bilaterally  . Closed fracture of unspecified part of vertebral column without mention of spinal cord injury     T7 s/p kyphoplasty, T12  . Unspecified essential hypertension   . Pain in joint, site unspecified     severe, diffuse, chronic pain  . Osteoarthrosis, hip   .  Fracture of left hip ([place]) 06/13/2007  . Edema 2015  . CKD stage 2 due to type 2 diabetes mellitus ([place]) 11/18/2014  . Neuropathy ([place])   . Positive ANA (antinuclear antibody)   . Insomnia     Past Surgical History  Procedure Laterality Date  . Dilation and curettage of uterus      x2  . Middle ear surgery Bilateral 1938  . Mastectomy Left 04/29/1989  . Tonsillectomy  1968  . Nasal sinus surgery  1990 & 2000  . Cholecystectomy, laparoscopic  09/20/2006  . Orif hip fracture Left 06/13/2007    following a syncopal episode  . Kyphoplasty  07/03/2010    T7 and T12  . Kyphoplasty    . Femur im nail Right 03/18/2015    Procedure: INTRAMEDULLARY (IM) NAIL FEMORAL;  Surgeon: Rod Can, MD;  Location: [facility];  Service: Orthopedics;  Laterality: Right;    Medications: Prior to Admission medications   Medication Sig Start Date End Date Taking? Authorizing Provider  amLODipine (NORVASC) 5 MG tablet One daly to control BP 05/07/15  Yes Estill Dooms, MD  aspirin EC 325 MG EC tablet Take 1 tablet (325 mg total) by mouth 2 (two) times daily after a meal. Patient taking differently: Take 325 mg by mouth daily.  03/23/15  Yes Eugenie Filler, MD  atenolol (TENORMIN) 25 MG tablet Take one tablet by mouth once daily for blood pressure 02/12/15  Yes Lauree Chandler, NP  cholecalciferol (VITAMIN D) 1000 UNITS tablet Take 3,000 Units by mouth daily.   Yes Historical Provider, MD  Cyanocobalamin (VITAMIN B-12 CR PO) Take 1 tablet by mouth daily.    Yes Historical Provider, MD  diclofenac (VOLTAREN) 50 MG EC tablet Take one tablet by mouth twice daily to help arthritis pain 06/04/14  Yes Gildardo Cranker, DO  irbesartan (AVAPRO) 300 MG tablet One daily to control BP 05/07/15  Yes Estill Dooms, MD  levothyroxine (SYNTHROID, LEVOTHROID) 112 MCG tablet Take one tablet by mouth once daily for thyroid supplement 03/26/14  Yes Estill Dooms, MD  lidocaine (LIDODERM) 5 % Place 1-3 patches onto the skin daily.  Apply in AM and remove in PM. Remove & Discard patch within 12 hours or as directed by MD 03/23/15  Yes Eugenie Filler, MD  omeprazole (PRILOSEC) 20 MG capsule Take 1 capsule (20 mg total) by mouth daily. 05/07/15  Yes Estill Dooms, MD  oxyCODONE-acetaminophen (PERCOCET/ROXICET) 5-325 MG tablet Take one tablet at 8 AM, 1 PM, 6 PM, and 10 PM. May have additional tablet between the hours of 1 AM and 7 AM if she wakens with pain. Patient taking differently: Take 1 tablet by mouth every 6 (six) hours as needed. Take one tablet at 8 AM, 1 PM, 6 PM, and 10 PM. May have additional tablet between the hours of 1 AM and 7 AM if she wakens with pain. 04/16/15  Yes Estill Dooms, MD  zolpidem (AMBIEN) 10 MG tablet Take one tablet by mouth by mouth at bedtime for sleep 03/17/15  Yes Estill Dooms, MD  denosumab (PROLIA) 60 MG/ML SOLN injection Inject  60 mg into the skin every 6 (six) months. Administer in upper arm, thigh, or abdomen    Historical Provider, MD  prochlorperazine (COMPAZINE) 10 MG tablet Take 1 tablet (10 mg total) by mouth every 6 (six) hours as needed for nausea or vomiting. 03/23/15   Eugenie Filler, MD    Allergies:   Allergies  Allergen Reactions  . Aspirin     Drop in body temp with large quantities, can tolerate low doses of aspirin  . Penicillins Swelling    Has patient had a PCN reaction causing immediate rash, facial/tongue/throat swelling, SOB or lightheadedness with hypotension:  Has patient had a PCN reaction causing severe rash involving mucus membranes or skin necrosis:  Has patient had a PCN reaction that required hospitalization  Has patient had a PCN reaction occurring within the last 10 years:  If all of the above answers are "NO", then may proceed with Cephalosporin use.    Social History:  reports that she has quit smoking. She has never used smokeless tobacco. She reports that she does not drink alcohol or use illicit drugs. she is currently in a skilled nursing  facility for rehabilitation  Family History: No family history of any blood clots per the patient  Physical Exam: Blood pressure 156/78, pulse 58, temperature 97.8 F (36.6 C), temperature source Oral, resp. rate 14, SpO2 100 %. General: Alert, awake, oriented x3, in no acute distress. HEENT: normocephalic, atraumatic, anicteric sclera, pink conjunctiva, pupils equal and reactive to light and accomodation, oropharynx clear Neck: supple, no masses or lymphadenopathy, no goiter, no bruits  Heart: Regular rate and rhythm, without murmurs, rubs or gallops. Lungs: Clear to auscultation bilaterally, no wheezing, rales or rhonchi. Abdomen: Soft, nontender, nondistended, positive bowel sounds, no masses. Extremities: No clubbing, cyanosis or edema with positive pedal pulses. Neuro: Grossly intact, no focal neurological deficits, strength 5/5 upper and lower extremities bilaterally Psych: alert and oriented x 3, normal mood and affect Skin: no rashes or lesions, warm and dry   LABS on Admission:  Basic Metabolic Panel:  Recent Labs Lab 05/07/15 1209  NA 132*  K 4.7  CL 100*  CO2 22  GLUCOSE 106*  BUN 22*  CREATININE 1.05*  CALCIUM 8.8*   Liver Function Tests: No results for input(s): AST, ALT, ALKPHOS, BILITOT, PROT, ALBUMIN in the last 168 hours. No results for input(s): LIPASE, AMYLASE in the last 168 hours. No results for input(s): AMMONIA in the last 168 hours. CBC:  Recent Labs Lab 05/07/15 1209  WBC 7.7  HGB 12.2  HCT 35.5*  MCV 96.7  PLT 175   Cardiac Enzymes: No results for input(s): CKTOTAL, CKMB, CKMBINDEX, TROPONINI in the last 168 hours. BNP: Invalid input(s): POCBNP CBG: No results for input(s): GLUCAP in the last 168 hours.  Radiological Exams on Admission:  Dg Chest 2 View  05/07/2015  CLINICAL DATA:  Chest pain with headache and shortness of breath for 2 days. EXAM: CHEST  2 VIEW COMPARISON:  None. FINDINGS: Mild cardiomegaly. Tortuosity of the  thoracic aorta. No confluent airspace opacities or effusions. No acute bony abnormality. Multiple thoracic compression fractures with multilevel vertebroplasty changes. IMPRESSION: Cardiomegaly.  No active disease. Electronically Signed   By: Rolm Baptise M.D.   On: 05/07/2015 12:40   Ct Angio Chest Pe W/cm &/or Wo Cm  05/07/2015  CLINICAL DATA:  Chest pain and shortness of breath, history of recent hip replacement approximately 1 month ago EXAM: CT ANGIOGRAPHY CHEST WITH CONTRAST TECHNIQUE: Multidetector CT  imaging of the chest was performed using the standard protocol during bolus administration of intravenous contrast. Multiplanar CT image reconstructions and MIPs were obtained to evaluate the vascular anatomy. CONTRAST:  153mL OMNIPAQUE IOHEXOL 350 MG/ML SOLN COMPARISON:  None. No focal infiltrate is noted. FINDINGS: The lungs are well aerated and demonstrates some dependent atelectatic changes. No focal confluent infiltrate or sizable nodule is noted. No effusion or pneumothorax is seen. The thoracic inlet is within normal limits. Limited opacification of the aorta is seen. Mild calcifications are noted without definitive aneurysmal dilatation. The pulmonary artery demonstrates a normal branching pattern. There are changes consistent with right lower lobe pulmonary emboli. The cardiac structures show no definitive right heart strain. Visualized upper abdomen is within normal limits. The osseous structures show changes of prior vertebral augmentation as well as other non treated compression deformities. Review of the MIP images confirms the above findings. IMPRESSION: Right lower lobe pulmonary emboli without significant right heart strain. Changes of multiple thoracic compression deformities with evidence of prior vertebral augmentation. Electronically Signed   By: Inez Catalina M.D.   On: 05/07/2015 15:29    *I have personally reviewed the images above*  EKG: Independently reviewed. Rate 57, no acute  ST-T wave changes suggestive of ischemia   Assessment/Plan Principal Problem:  Acute Pulmonary embolism ([place]): Likely due to recent immobility and rehabilitation from the right hip/ femur fracture - Currently hemodynamically stable, no chest pain or hypoxia. CT angiogram showed no right heart strain - Admit to telemetry, placed on heparin drip - Obtain cardiac enzymes, 2-D echo, Doppler ultrasound of the lower activities to rule out DVT - Discussed in detail regarding the Coumadin versus NOAC, risks and cons of each anticoagulation choice, patient and her family is agreeable for NOAC. Will obtain case management consult regarding co-pay for xarelto.  Active Problems:   Hypothyroidism - Obtain TSH, continue Synthroid    Essential hypertension - Currently somewhat elevated, Continue amlodipine, Avapro, atenolol at home doses.    CKD (chronic kidney disease) stage 2, GFR 60-89 ml/min - Currently stable, creatinine at baseline    Fracture, intertrochanteric, right femur ([facility]) - PT OT evaluation tomorrow    GERD (gastroesophageal reflux disease) - Continue PPI  DVT prophylaxis: On heparin drip  CODE STATUS: Discussed in detail with the patient, she opted to be DNR/DNI  Family Communication: Admission, patients condition and plan of care including tests being ordered have been discussed with the patient, husband, son who indicates understanding and agree with the plan and Code Status  Disposition plan: Further plan will depend as patient's clinical course evolves and further radiologic and laboratory data become available.   Time Spent on Admission: 28mins   RAI,RIPUDEEP M.D. [facility] 05/07/2015, 4:58 PM Pager: [phone]  If 7PM-7AM, please contact night-coverage [email or web address] Password [email or web address]

## 2015-05-07 NOTE — Progress Notes (Signed)
Pt is from [facility], as of 05/06/15, after finishing rehab at [facility].  Pt can return to either the ALF or SNF at d/c, depending on her needs.  Unit CSW will continue to follow for disposition.

## 2015-05-07 NOTE — ED Notes (Signed)
Pt returned from CT, placed on bedside commode.

## 2015-05-07 NOTE — Progress Notes (Signed)
Patient ID: Bailey [name], female   DOB: Mar 31, 1927, 81 y.o.   MRN: [id number]    HISTORY AND PHYSICAL  Location:  [facility] Room Number: AL 35 Place of Service: ALF (13)   Extended Emergency Contact Information Primary Emergency Contact: Kirkendoll,[name] Address: [address] [place]          [place], [place] [address] [name] of [place] Phone: [phone] Mobile Phone: [phone] Relation: Spouse Secondary Emergency Contact: [name],[name] Address: [address]          [place], [place] [address] [name] of [place] Phone: [phone] Relation: Son  Advanced Directive information Does patient have an advance directive?: Yes, Type of Advance Directive: Healthcare Power of [place];Living will;Out of facility DNR (pink MOST or yellow form), Pre-existing out of facility DNR order (yellow form or pink MOST form): Yellow form placed in chart (order not valid for inpatient use), Does patient want to make changes to advanced directive?: No - Patient declined  Chief Complaint  Patient presents with  . Acute Visit    Chest pain    HPI:  Patient had a fracture of multiple pubic rami on the right side as well as an intertrochanteric fracture right femur on 03/18/2015. She was hospitalized through 11/ 7/16. She had an intramedullary nail placement of splint on 03/18/2015.  She was admitted to skilled nursing facility 03/23/2015. She underwent rehabilitative therapies. She was transferred to the assisted living area of [facility] on 05/06/2015.  Patient had her antihypertensive medications held during her hospitalization Blood pressures were followed in the skilled nursing was noted that many were in the elevated range. She was resumed on Avapro 300 mg daily, metoprolol 25 mg daily, and amlodipine 5 mg daily.  Patient's made some progress of rehabilitative therapies, but remains unstable with using her walker. Pain management also  continues to be a challenge.  She has not complained of chest pains, or palpitations. She has noted some mild headaches when blood pressures were elevated. She has had mild dyspnea with exertion.  She was examined today following her transfer to the assisted living area yesterday. She appeared to be comfortable and was not tachypnea.  5 minutes after I left the room, she complained to staff about shortness of breath and a chest pain. She described the chest pain as in the interscapular area. Blood pressure fell quite dramatically to 105/64. Pulse rate was rapid. She seemed quite uncomfortable. She received nitroglycerin sublingual. Approximately 5 minutes later she did appear more comfortable with her breathing and said that the discomfort in the chest had eased. Oxygen saturation obtained during that time was 98% on room air. At the time of my second exam, she was pale and mildly diaphoretic.   Past Medical History  Diagnosis Date  . Osteoarthrosis, unspecified whether generalized or localized, forearm     bilaterally wrist  . Allergic rhinitis, cause unspecified   . Senile osteoporosis   . Malignant neoplasm of breast (female), unspecified site 1990    left. Had surgerey and chemo, but no radiation  . Acquired cyst of kidney     left  . Left cavernous carotid aneurysm 08/2008    1-1/83m aneurysm of cavernous carotid artery  . Anemia, unspecified   . Unspecified gastritis and gastroduodenitis without mention of hemorrhage   . 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 Other and unspecified nonspecific immunological findings  positive ANA  . History of Epstein-Barr virus infection   . Herpes simplex without mention of complication   . Insomnia, unspecified   . Unspecified hypothyroidism   . Hyposmolality and/or hyponatremia   . Unspecified vitamin D deficiency   . Pure hypercholesterolemia   . Other B-complex deficiencies   .  Osteoarthrosis of knee     bilaterally  . Closed fracture of unspecified part of vertebral column without mention of spinal cord injury     T7 s/p kyphoplasty, T12  . Unspecified essential hypertension   . Pain in joint, site unspecified     severe, diffuse, chronic pain  . Osteoarthrosis, hip   . Fracture of left hip ([place]) 06/13/2007  . Edema 2015  . CKD stage 2 due to type 2 diabetes mellitus ([place]) 11/18/2014  . Neuropathy ([place])   . Positive ANA (antinuclear antibody)   . Insomnia     Past Surgical History  Procedure Laterality Date  . Dilation and curettage of uterus      x2  . Middle ear surgery Bilateral 1938  . Mastectomy Left 04/29/1989  . Tonsillectomy  1968  . Nasal sinus surgery  1990 & 2000  . Cholecystectomy, laparoscopic  09/20/2006  . Orif hip fracture Left 06/13/2007    following a syncopal episode  . Kyphoplasty  07/03/2010    T7 and T12  . Kyphoplasty    . Femur im nail Right 03/18/2015    Procedure: INTRAMEDULLARY (IM) NAIL FEMORAL;  Surgeon: Rod Can, MD;  Location: [facility]S;  Service: Orthopedics;  Laterality: Right;    Patient Care Team: Estill Dooms, MD as PCP - General (Internal Medicine) Man Mast X, NP as Nurse Practitioner (Nurse Practitioner)  Social History   Social History  . Marital Status: Married    Spouse Name: N/A  . Number of Children: N/A  . Years of Education: N/A   Occupational History  . retired Marine scientist    Social History Main Topics  . Smoking status: Former Research scientist (life sciences)  . Smokeless tobacco: Never Used  . Alcohol Use: No  . Drug Use: No  . Sexual Activity: Not on file   Other Topics Concern  . Not on file   Social History Narrative   Lives at [facility] since 10/16/2013, moved from [place]    Married 1968   Never smoked   No alcohol    Exercise walking, walks with cane   POA/Living Will                reports that she has quit smoking. She has never used smokeless tobacco. She reports that she does not drink alcohol or use  illicit drugs.  No family history on file. Family Status  Relation Status Death Age  . Mother Deceased 22    old age  . Father Deceased 57    war casualty  . Sister Deceased   . Brother Deceased   . Son Alive     Immunization History  Administered Date(s) Administered  . Influenza Split 02/13/2013  . Influenza-Unspecified 02/27/2014, 02/12/2015  . Pneumococcal Polysaccharide-23 02/13/2013    Allergies  Allergen Reactions  . Aspirin     Drop in body temp with large quantities, can tolerate low doses of aspirin  . Penicillins Swelling    Medications: Patient's Medications  New Prescriptions   AMLODIPINE (NORVASC) 5 MG TABLET    One daly to control BP   IRBESARTAN (AVAPRO) 300 MG TABLET    One daily to  control BP   OMEPRAZOLE (PRILOSEC) 20 MG CAPSULE    Take 1 capsule (20 mg total) by mouth daily.  Previous Medications   ASPIRIN EC 325 MG EC TABLET    Take 1 tablet (325 mg total) by mouth 2 (two) times daily after a meal.   ATENOLOL (TENORMIN) 25 MG TABLET    Take one tablet by mouth once daily for blood pressure   CHOLECALCIFEROL (VITAMIN D) 1000 UNITS TABLET    Take 3,000 Units by mouth daily.   CYANOCOBALAMIN (VITAMIN B-12 CR PO)    Take 1 tablet by mouth daily.    DENOSUMAB (PROLIA) 60 MG/ML SOLN INJECTION    Inject 60 mg into the skin every 6 (six) months. Administer in upper arm, thigh, or abdomen   DICLOFENAC (VOLTAREN) 50 MG EC TABLET    Take one tablet by mouth twice daily to help arthritis pain   LEVOTHYROXINE (SYNTHROID, LEVOTHROID) 112 MCG TABLET    Take one tablet by mouth once daily for thyroid supplement   LIDOCAINE (LIDODERM) 5 %    Place 1-3 patches onto the skin daily. Apply in AM and remove in PM. Remove & Discard patch within 12 hours or as directed by MD   OXYCODONE-ACETAMINOPHEN (PERCOCET/ROXICET) 5-325 MG TABLET    Take one tablet at 8 AM, 1 PM, 6 PM, and 10 PM. May have additional tablet between the hours of 1 AM and 7 AM if she wakens with pain.    PROCHLORPERAZINE (COMPAZINE) 10 MG TABLET    Take 1 tablet (10 mg total) by mouth every 6 (six) hours as needed for nausea or vomiting.   ZOLPIDEM (AMBIEN) 10 MG TABLET    Take one tablet by mouth by mouth at bedtime for sleep  Modified Medications   No medications on file  Discontinued Medications   PANTOPRAZOLE (PROTONIX) 40 MG TABLET    Take 1 tablet (40 mg total) by mouth daily.    Review of Systems  Constitutional: Negative for fever, chills and diaphoresis.  HENT: Negative for congestion, ear discharge, ear pain, hearing loss, nosebleeds and tinnitus.        Sinus allergies  Eyes: Negative for photophobia and pain.  Respiratory: Positive for chest tightness and shortness of breath. Negative for cough and wheezing.   Cardiovascular: Positive for chest pain (Interscapular) and leg swelling. Negative for palpitations.  Gastrointestinal: Positive for constipation. Negative for nausea, vomiting and abdominal pain.       Flatulence. Hemorrhoids.  Genitourinary: Negative.  Negative for dysuria, urgency and frequency.  Musculoskeletal: Positive for back pain and gait problem (using walker).       History of osteoporosis. Pain in the left knee and both hips. C/o right leg muscle spasm on and off  Skin: Negative for rash.       Right hip surgical incision healed.  Neurological: Negative for dizziness, tremors and headaches.  Psychiatric/Behavioral: Negative for suicidal ideas and hallucinations. The patient is not nervous/anxious.     Filed Vitals:   05/07/15 1101  BP: 106/64  Pulse: 62  Temp: 98.6 F (37 C)  Resp: 24   There is no weight on file to calculate BMI.  Physical Exam  Constitutional: She is oriented to person, place, and time. No distress.  Elderly. Frail. Slow in movement due to back pain.  HENT:  Right Ear: External ear normal.  Left Ear: External ear normal.  Nose: Nose normal.  Mouth/Throat: Oropharynx is clear and moist. No oropharyngeal exudate.  Eyes:  Conjunctivae and EOM are normal. Pupils are equal, round, and reactive to light.  Neck: No JVD present. No tracheal deviation present. No thyromegaly present.  Cardiovascular: Normal rate, regular rhythm, normal heart sounds and intact distal pulses.  Exam reveals no gallop and no friction rub.   No murmur heard. Pulmonary/Chest: No respiratory distress. She has no wheezes. She has no rales. She exhibits no tenderness.  Tachypneic  Abdominal: She exhibits no distension and no mass. There is no tenderness.  Musculoskeletal: Normal range of motion. She exhibits edema and tenderness (Lumbar area and left knee).  Right hip pain with movement, very trace edema R leg  Lymphadenopathy:    She has no cervical adenopathy.  Neurological: She is alert and oriented to person, place, and time. She has normal reflexes.  10/22/2013 MMSE 30/30. Passed clock drawing  Skin: No rash noted. No erythema. No pallor.  Right hip surgical incision healed.  Psychiatric: She has a normal mood and affect. Her behavior is normal. Judgment and thought content normal.    Labs reviewed: Lab Summary Latest Ref Rng 04/20/2015 04/10/2015 03/30/2015 03/26/2015 03/23/2015 03/23/2015 03/22/2015  Hemoglobin 12.0 - 16.0 g/dL 10.5(A) (None) 9.9(A) 9.8(A) 9.8(L) 7.5(L) 7.8(L)  Hematocrit 36 - 46 % 31(A) (None) 29(A) 29(A) 28.3(L) 22.2(L) 22.6(L)  White count - 4.3 (None) 5.2 7.0 (None) 5.7 6.8  Platelet count 150 - 399 K/L 158 (None) 232 238 (None) 156 139(L)  Sodium 137 - 147 mmol/L 135(A) (None) (None) 134(A) (None) 135 134(L)  Potassium 3.4 - 5.3 mmol/L 4.4 (None) (None) 4.1 (None) 3.5 3.6  Calcium 8.9 - 10.3 mg/dL (None) (None) (None) (None) (None) 7.3(L) 7.2(L)  Phosphorus - (None) (None) (None) (None) (None) (None) (None)  Creatinine 0.5 - 1.1 mg/dL 1.1 (None) (None) 1.4(A) (None) 1.16(H) 0.98  AST 13 - 35 U/L (None) 14 (None) 15 (None) (None) (None)  Alk Phos 25 - 125 U/L (None) 95 (None) 41 (None) (None) (None)  Bilirubin  - (None) (None) (None) (None) (None) (None) (None)  Glucose - (None) (None) (None) 96 (None) 104(H) 109(H)  Cholesterol - (None) (None) (None) (None) (None) (None) (None)  HDL cholesterol - (None) (None) (None) (None) (None) (None) (None)  Triglycerides - (None) (None) (None) (None) (None) (None) (None)  LDL Direct - (None) (None) (None) (None) (None) (None) (None)  LDL Calc - (None) (None) (None) (None) (None) (None) (None)  Total protein - (None) (None) (None) (None) (None) (None) (None)  Albumin - (None) (None) (None) (None) (None) (None) (None)   Lab Results  Component Value Date   BUN 23* 04/20/2015   No results found for: HGBA1C Lab Results  Component Value Date   TSH 0.613 03/19/2015   Assessment/Plan  1. Chest pain, unspecified chest pain type Etiology of the chest pain is uncertain at this time. Possibilities include myocardial infarction, musculoskeletal pain, and less likely,pulmonary embolism. Ambulance was summoned and patient was [facility] to the emergency department at [facility].  2. Essential hypertension Fluctuating  3. Fracture, intertrochanteric, right femur, closed, initial encounter ([place]) Healing

## 2015-05-07 NOTE — ED Provider Notes (Addendum)
CSN: [id number]     Arrival date & time 05/07/15  1150 History   First MD Initiated Contact with Patient 05/07/15 1207     Chief Complaint  Patient presents with  . Chest Pain  . Shortness of Breath     (Consider location/radiation/quality/duration/timing/severity/associated sxs/prior Treatment) Patient is a 81 y.o. female presenting with chest pain and shortness of breath. The history is provided by the patient.  Chest Pain Associated symptoms: back pain and shortness of breath   Associated symptoms: no abdominal pain, no fever, no nausea and not vomiting   Shortness of Breath Associated symptoms: chest pain   Associated symptoms: no abdominal pain, no fever and no vomiting    patient from rehabilitation facility acute onset of posterior chest pain between the shoulder blades that lasted for less than 20 minutes associated with shortness of breath. Patient's in rehabilitation following a right hip fracture and repair. Patient denies to me any anterior chest pain. Patient was given 1 nitroglycerin and one aspirin in route by EMS. Upon arrival patient denied any chest pressure. Patient states her main complaint is she feels short of breath.  Past Medical History  Diagnosis Date  . Osteoarthrosis, unspecified whether generalized or localized, forearm     bilaterally wrist  . Allergic rhinitis, cause unspecified   . Senile osteoporosis   . Malignant neoplasm of breast (female), unspecified site 1990    left. Had surgerey and chemo, but no radiation  . Acquired cyst of kidney     left  . Left cavernous carotid aneurysm 08/2008    1-1/78mm aneurysm of cavernous carotid artery  . Anemia, unspecified   . Unspecified gastritis and gastroduodenitis without mention of hemorrhage   . 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 Other and unspecified nonspecific immunological findings     positive ANA  . History of Epstein-Barr virus infection   .  Herpes simplex without mention of complication   . Insomnia, unspecified   . Unspecified hypothyroidism   . Hyposmolality and/or hyponatremia   . Unspecified vitamin D deficiency   . Pure hypercholesterolemia   . Other B-complex deficiencies   . Osteoarthrosis of knee     bilaterally  . Closed fracture of unspecified part of vertebral column without mention of spinal cord injury     T7 s/p kyphoplasty, T12  . Unspecified essential hypertension   . Pain in joint, site unspecified     severe, diffuse, chronic pain  . Osteoarthrosis, hip   . Fracture of left hip ([place]) 06/13/2007  . Edema 2015  . CKD stage 2 due to type 2 diabetes mellitus ([place]) 11/18/2014  . Neuropathy ([place])   . Positive ANA (antinuclear antibody)   . Insomnia    Past Surgical History  Procedure Laterality Date  . Dilation and curettage of uterus      x2  . Middle ear surgery Bilateral 1938  . Mastectomy Left 04/29/1989  . Tonsillectomy  1968  . Nasal sinus surgery  1990 & 2000  . Cholecystectomy, laparoscopic  09/20/2006  . Orif hip fracture Left 06/13/2007    following a syncopal episode  . Kyphoplasty  07/03/2010    T7 and T12  . Kyphoplasty    . Femur im nail Right 03/18/2015    Procedure: INTRAMEDULLARY (IM) NAIL FEMORAL;  Surgeon: Rod Can, MD;  Location: [facility];  Service: Orthopedics;  Laterality: Right;   No family history on file. Social History  Substance Use Topics  . Smoking status: Former Research scientist (life sciences)  . Smokeless tobacco: Never Used  . Alcohol Use: No   OB History    No data available     Review of Systems  Constitutional: Negative for fever.  HENT: Negative for congestion.   Respiratory: Positive for shortness of breath.   Cardiovascular: Positive for chest pain.  Gastrointestinal: Negative for nausea, vomiting and abdominal pain.  Genitourinary: Positive for pelvic pain.  Musculoskeletal: Positive for back pain.  Hematological: Does not bruise/bleed easily.  Psychiatric/Behavioral:  Negative for confusion.      Allergies  Aspirin and Penicillins  Home Medications   Prior to Admission medications   Medication Sig Start Date End Date Taking? Authorizing Provider  amLODipine (NORVASC) 5 MG tablet One daly to control BP 05/07/15  Yes Estill Dooms, MD  aspirin EC 325 MG EC tablet Take 1 tablet (325 mg total) by mouth 2 (two) times daily after a meal. Patient taking differently: Take 325 mg by mouth daily.  03/23/15  Yes Eugenie Filler, MD  atenolol (TENORMIN) 25 MG tablet Take one tablet by mouth once daily for blood pressure 02/12/15  Yes Lauree Chandler, NP  cholecalciferol (VITAMIN D) 1000 UNITS tablet Take 3,000 Units by mouth daily.   Yes Historical Provider, MD  Cyanocobalamin (VITAMIN B-12 CR PO) Take 1 tablet by mouth daily.    Yes Historical Provider, MD  diclofenac (VOLTAREN) 50 MG EC tablet Take one tablet by mouth twice daily to help arthritis pain 06/04/14  Yes Gildardo Cranker, DO  irbesartan (AVAPRO) 300 MG tablet One daily to control BP 05/07/15  Yes Estill Dooms, MD  levothyroxine (SYNTHROID, LEVOTHROID) 112 MCG tablet Take one tablet by mouth once daily for thyroid supplement 03/26/14  Yes Estill Dooms, MD  lidocaine (LIDODERM) 5 % Place 1-3 patches onto the skin daily. Apply in AM and remove in PM. Remove & Discard patch within 12 hours or as directed by MD 03/23/15  Yes Eugenie Filler, MD  omeprazole (PRILOSEC) 20 MG capsule Take 1 capsule (20 mg total) by mouth daily. 05/07/15  Yes Estill Dooms, MD  oxyCODONE-acetaminophen (PERCOCET/ROXICET) 5-325 MG tablet Take one tablet at 8 AM, 1 PM, 6 PM, and 10 PM. May have additional tablet between the hours of 1 AM and 7 AM if she wakens with pain. Patient taking differently: Take 1 tablet by mouth every 6 (six) hours as needed. Take one tablet at 8 AM, 1 PM, 6 PM, and 10 PM. May have additional tablet between the hours of 1 AM and 7 AM if she wakens with pain. 04/16/15  Yes Estill Dooms, MD  zolpidem  (AMBIEN) 10 MG tablet Take one tablet by mouth by mouth at bedtime for sleep 03/17/15  Yes Estill Dooms, MD  denosumab (PROLIA) 60 MG/ML SOLN injection Inject 60 mg into the skin every 6 (six) months. Administer in upper arm, thigh, or abdomen    Historical Provider, MD  prochlorperazine (COMPAZINE) 10 MG tablet Take 1 tablet (10 mg total) by mouth every 6 (six) hours as needed for nausea or vomiting. 03/23/15   Eugenie Filler, MD   BP 177/88 mmHg  Pulse 62  Temp(Src) 97.8 F (36.6 C) (Oral)  Resp 14  SpO2 97% Physical Exam  Constitutional: She is oriented to person, place, and time. She appears well-developed and well-nourished. No distress.  HENT:  Head: Normocephalic and atraumatic.  Mouth/Throat: Oropharynx is clear and moist.  Eyes:  Conjunctivae and EOM are normal. Pupils are equal, round, and reactive to light.  Neck: Normal range of motion.  Cardiovascular: Normal rate, regular rhythm and normal heart sounds.   No murmur heard. Pulmonary/Chest: Effort normal and breath sounds normal. No respiratory distress.  Abdominal: Soft. Bowel sounds are normal. There is no tenderness.  Musculoskeletal: Normal range of motion. She exhibits edema.  Neurological: She is alert and oriented to person, place, and time. No cranial nerve deficit. She exhibits normal muscle tone. Coordination normal.  Skin: Skin is warm.  Nursing note and vitals reviewed.   ED Course  Procedures (including critical care time) Labs Review Labs Reviewed  BASIC METABOLIC PANEL - Abnormal; Notable for the following:    Sodium 132 (*)    Chloride 100 (*)    Glucose, Bld 106 (*)    BUN 22 (*)    Creatinine, Ser 1.05 (*)    Calcium 8.8 (*)    GFR calc non Af Amer 47 (*)    GFR calc Af Amer 54 (*)    All other components within normal limits  CBC - Abnormal; Notable for the following:    RBC 3.67 (*)    HCT 35.5 (*)    All other components within normal limits  I-STAT TROPOININ, ED  I-STAT TROPOININ, ED    Results for orders placed or performed during the hospital encounter of [id number]  Basic metabolic panel  Result Value Ref Range   Sodium 132 (L) 135 - 145 mmol/L   Potassium 4.7 3.5 - 5.1 mmol/L   Chloride 100 (L) 101 - 111 mmol/L   CO2 22 22 - 32 mmol/L   Glucose, Bld 106 (H) 65 - 99 mg/dL   BUN 22 (H) 6 - 20 mg/dL   Creatinine, Ser 1.05 (H) 0.44 - 1.00 mg/dL   Calcium 8.8 (L) 8.9 - 10.3 mg/dL   GFR calc non Af Amer 47 (L) >60 mL/min   GFR calc Af Amer 54 (L) >60 mL/min   Anion gap 10 5 - 15  CBC  Result Value Ref Range   WBC 7.7 4.0 - 10.5 K/uL   RBC 3.67 (L) 3.87 - 5.11 MIL/uL   Hemoglobin 12.2 12.0 - 15.0 g/dL   HCT 35.5 (L) 36.0 - 46.0 %   MCV 96.7 78.0 - 100.0 fL   MCH 33.2 26.0 - 34.0 pg   MCHC 34.4 30.0 - 36.0 g/dL   RDW 12.6 11.5 - 15.5 %   Platelets 175 150 - 400 K/uL  I-stat troponin, ED (not at [facility])  Result Value Ref Range   Troponin i, poc 0.00 0.00 - 0.08 ng/mL   Comment 3          I-Stat Troponin, ED (not at [facility])  Result Value Ref Range   Troponin i, poc 0.00 0.00 - 0.08 ng/mL   Comment 3             Imaging Review Dg Chest 2 View  05/07/2015  CLINICAL DATA:  Chest pain with headache and shortness of breath for 2 days. EXAM: CHEST  2 VIEW COMPARISON:  None. FINDINGS: Mild cardiomegaly. Tortuosity of the thoracic aorta. No confluent airspace opacities or effusions. No acute bony abnormality. Multiple thoracic compression fractures with multilevel vertebroplasty changes. IMPRESSION: Cardiomegaly.  No active disease. Electronically Signed   By: Rolm Baptise M.D.   On: 05/07/2015 12:40   Ct Angio Chest Pe W/cm &/or Wo Cm  05/07/2015  CLINICAL DATA:  Chest pain and  shortness of breath, history of recent hip replacement approximately 1 month ago EXAM: CT ANGIOGRAPHY CHEST WITH CONTRAST TECHNIQUE: Multidetector CT imaging of the chest was performed using the standard protocol during bolus administration of intravenous contrast. Multiplanar CT image  reconstructions and MIPs were obtained to evaluate the vascular anatomy. CONTRAST:  132mL OMNIPAQUE IOHEXOL 350 MG/ML SOLN COMPARISON:  None. No focal infiltrate is noted. FINDINGS: The lungs are well aerated and demonstrates some dependent atelectatic changes. No focal confluent infiltrate or sizable nodule is noted. No effusion or pneumothorax is seen. The thoracic inlet is within normal limits. Limited opacification of the aorta is seen. Mild calcifications are noted without definitive aneurysmal dilatation. The pulmonary artery demonstrates a normal branching pattern. There are changes consistent with right lower lobe pulmonary emboli. The cardiac structures show no definitive right heart strain. Visualized upper abdomen is within normal limits. The osseous structures show changes of prior vertebral augmentation as well as other non treated compression deformities. Review of the MIP images confirms the above findings. IMPRESSION: Right lower lobe pulmonary emboli without significant right heart strain. Changes of multiple thoracic compression deformities with evidence of prior vertebral augmentation. Electronically Signed   By: Inez Catalina M.D.   On: 05/07/2015 15:29   I have personally reviewed and evaluated these images and lab results as part of my medical decision-making.   EKG Interpretation   Date/Time:  Thursday May 07 2015 12:01:47 EST Ventricular Rate:  57 PR Interval:  209 QRS Duration: 89 QT Interval:  495 QTC Calculation: 482 R Axis:   -3 Text Interpretation:  Sinus rhythm Left ventricular hypertrophy No  previous ECGs available Confirmed by Allex Lapoint  MD, Melaya Hoselton [id number]) on  05/07/2015 12:45:46 PM      MDM   Final diagnoses:  Chest pain, unspecified chest pain type  SOB (shortness of breath)  Other acute pulmonary embolism without acute cor pulmonale ([place])    Patient brought in from a rehabilitation facility she is currently there mentating from a broken hip. Right  hip. Shortly prior arrival had about less than 20 minutes of chest pain but the chest pain was behind her shoulder blades. Not in the anterior chest and felt short of breath. Patient obviously a set up for possible pulmonary embolus. Oxygen saturations are fine not terribly tachypnea can also not tachycardic but CT angiogram ordered to rule out pulmonary embolus. Unlikely to be an acute cardiac event based on the shortness of the pain and pain is now completely resolved initial troponin was negative. EKG without acute changes. Chest x-ray also negative for pneumonia pneumothorax or pulmonary edema.  In addition we'll do a recheck on a point-of-care troponin to make sure not any changes if that's negative been no concerns for cardiac.  Fredia Sorrow, MD 05/07/15 1503  Patient's repeat troponins also normal. CT angios shows evidence of a right lower lobe pulmonary embolus. No right heart strain. Patient will require admission.  Fredia Sorrow, MD 05/07/15 1553  Discussed with hospitalist will start on heparin for now.  Fredia Sorrow, MD 05/07/15 [phone]

## 2015-05-07 NOTE — Progress Notes (Signed)
Bailey [name] is a 81 y.o. female patient admitted from ED awake, alert - oriented  X 4 - no acute distress noted.  VSS - Blood pressure 156/78, pulse 58, temperature 97.8 F (36.6 C), temperature source Oral, resp. rate 14, SpO2 100 %.    IV in place, occlusive dsg intact without redness.  Orientation to room, and floor completed with information packet given to patient/family.  Patient declined safety video at this time.  Admission INP armband ID verified with patient/family, and in place.   SR up x 2, fall assessment complete, with patient and family able to verbalize understanding of risk associated with falls, and verbalized understanding to call nsg before up out of bed.  Call light within reach, patient able to voice, and demonstrate understanding.  Skin, clean-dry- intact without evidence of bruising, or skin tears.   No evidence of skin break down noted on exam.     Will cont to eval and treat per MD orders.  Teryl Lucy, RN 05/07/2015 7:45 PM

## 2015-05-07 NOTE — ED Notes (Signed)
Attempted report 

## 2015-05-07 NOTE — Progress Notes (Signed)
This encounter was created in error - please disregard.

## 2015-05-07 NOTE — ED Notes (Signed)
Per [facility] and femur. Pt ambulatory at this time. Approx 1 hour PTA, pt started having pressure CP radiating to back with SOB. Pt given 1 nitro and 1 asa (unsure how many mg) prior to EMS arrival. At this time pt denies any chest pressure. EKG shows sinus rhythm, unremarkable. AAOX4, in NAD.

## 2015-05-07 NOTE — Progress Notes (Addendum)
ANTICOAGULATION CONSULT NOTE - Initial Consult  Pharmacy Consult for Heparin Indication: pulmonary embolus  Allergies  Allergen Reactions  . Aspirin     Drop in body temp with large quantities, can tolerate low doses of aspirin  . Penicillins Swelling    Has patient had a PCN reaction causing immediate rash, facial/tongue/throat swelling, SOB or lightheadedness with hypotension:  Has patient had a PCN reaction causing severe rash involving mucus membranes or skin necrosis:  Has patient had a PCN reaction that required hospitalization  Has patient had a PCN reaction occurring within the last 10 years:  If all of the above answers are "NO", then may proceed with Cephalosporin use.    Patient Measurements: TBW 61.2 kg Heparin Dosing Weight: 57 kg  Vital Signs: Temp: 97.8 F (36.6 C) (12/22 1315) Temp Source: Oral (12/22 1315) BP: 178/85 mmHg (12/22 1600) Pulse Rate: 61 (12/22 1600)  Labs:  Recent Labs  05/07/15 1209  HGB 12.2  HCT 35.5*  PLT 175  CREATININE 1.05*    CrCl cannot be calculated (Unknown ideal weight.).   Medical History: Past Medical History  Diagnosis Date  . Osteoarthrosis, unspecified whether generalized or localized, forearm     bilaterally wrist  . Allergic rhinitis, cause unspecified   . Senile osteoporosis   . Malignant neoplasm of breast (female), unspecified site 1990    left. Had surgerey and chemo, but no radiation  . Acquired cyst of kidney     left  . Left cavernous carotid aneurysm 08/2008    1-1/71mm aneurysm of cavernous carotid artery  . Anemia, unspecified   . Unspecified gastritis and gastroduodenitis without mention of hemorrhage   . 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 Other and unspecified nonspecific immunological findings     positive ANA  . History of Epstein-Barr virus infection   . Herpes simplex without mention of complication   . Insomnia, unspecified   . Unspecified  hypothyroidism   . Hyposmolality and/or hyponatremia   . Unspecified vitamin D deficiency   . Pure hypercholesterolemia   . Other B-complex deficiencies   . Osteoarthrosis of knee     bilaterally  . Closed fracture of unspecified part of vertebral column without mention of spinal cord injury     T7 s/p kyphoplasty, T12  . Unspecified essential hypertension   . Pain in joint, site unspecified     severe, diffuse, chronic pain  . Osteoarthrosis, hip   . Fracture of left hip ([place]) 06/13/2007  . Edema 2015  . CKD stage 2 due to type 2 diabetes mellitus ([place]) 11/18/2014  . Neuropathy ([place])   . Positive ANA (antinuclear antibody)   . Insomnia      Assessment: 81 yo F presents on 12/22 with SOB and chest pain. CT showed evidence of a R lower lobe PE. Pharmacy consulted to start heparin.  Not on any anticoag PTA. Hgb 12.2, plts wnl. No s/s of bleed.  Goal of Therapy:  Heparin level 0.3-0.7 units/ml Monitor platelets by anticoagulation protocol: Yes   Plan:  Give 3,500 unit Heparin BOLUS Start heparin gtt at 1,000 units/hr Check 8 hr HL Monitor daily HL, CBC, s/s of bleed  Elenor Quinones, PharmD, [facility] Clinical Pharmacist Pager [phone] 05/07/2015 5:16 PM

## 2015-05-08 ENCOUNTER — Ambulatory Visit (HOSPITAL_COMMUNITY): Payer: Medicare Other

## 2015-05-08 ENCOUNTER — Encounter (HOSPITAL_COMMUNITY): Payer: Self-pay | Admitting: General Practice

## 2015-05-08 DIAGNOSIS — I2699 Other pulmonary embolism without acute cor pulmonale: Secondary | ICD-10-CM

## 2015-05-08 DIAGNOSIS — N182 Chronic kidney disease, stage 2 (mild): Secondary | ICD-10-CM

## 2015-05-08 DIAGNOSIS — I2609 Other pulmonary embolism with acute cor pulmonale: Secondary | ICD-10-CM

## 2015-05-08 DIAGNOSIS — S72141A Displaced intertrochanteric fracture of right femur, initial encounter for closed fracture: Secondary | ICD-10-CM

## 2015-05-08 DIAGNOSIS — I10 Essential (primary) hypertension: Secondary | ICD-10-CM

## 2015-05-08 DIAGNOSIS — R079 Chest pain, unspecified: Secondary | ICD-10-CM

## 2015-05-08 LAB — CBC
HCT: 33.1 % — ABNORMAL LOW (ref 36.0–46.0)
Hemoglobin: 11.2 g/dL — ABNORMAL LOW (ref 12.0–15.0)
MCH: 32.9 pg (ref 26.0–34.0)
MCHC: 33.8 g/dL (ref 30.0–36.0)
MCV: 97.4 fL (ref 78.0–100.0)
PLATELETS: 145 10*3/uL — AB (ref 150–400)
RBC: 3.4 MIL/uL — ABNORMAL LOW (ref 3.87–5.11)
RDW: 12.7 % (ref 11.5–15.5)
WBC: 4.8 10*3/uL (ref 4.0–10.5)

## 2015-05-08 LAB — BASIC METABOLIC PANEL
Anion gap: 8 (ref 5–15)
BUN: 20 mg/dL (ref 6–20)
CHLORIDE: 107 mmol/L (ref 101–111)
CO2: 21 mmol/L — ABNORMAL LOW (ref 22–32)
Calcium: 7.9 mg/dL — ABNORMAL LOW (ref 8.9–10.3)
Creatinine, Ser: 1.04 mg/dL — ABNORMAL HIGH (ref 0.44–1.00)
GFR calc Af Amer: 55 mL/min — ABNORMAL LOW (ref >60)
GFR calc non Af Amer: 47 mL/min — ABNORMAL LOW (ref >60)
Glucose, Bld: 91 mg/dL (ref 65–99)
Potassium: 4 mmol/L (ref 3.5–5.1)
SODIUM: 136 mmol/L (ref 135–145)

## 2015-05-08 LAB — HEPARIN LEVEL (UNFRACTIONATED)
HEPARIN UNFRACTIONATED: 0.86 [IU]/mL — AB (ref 0.30–0.70)
Heparin Unfractionated: 0.7 IU/mL (ref 0.30–0.70)

## 2015-05-08 LAB — TROPONIN I: Troponin I: <0.03 ng/mL (ref <0.031)

## 2015-05-08 MED ORDER — APIXABAN 5 MG PO TABS
10.0000 mg | ORAL_TABLET | Freq: Two times a day (BID) | ORAL | Status: DC
  Administered 2015-05-08 – 2015-05-09 (×3): 10 mg via ORAL
  Filled 2015-05-08 (×3): qty 2

## 2015-05-08 MED ORDER — ENSURE ENLIVE PO LIQD
237.0000 mL | Freq: Two times a day (BID) | ORAL | Status: DC
  Administered 2015-05-08 – 2015-05-09 (×3): 237 mL via ORAL

## 2015-05-08 MED ORDER — APIXABAN 5 MG PO TABS: 5.0000 mg | ORAL_TABLET | Freq: Two times a day (BID) | ORAL | Status: DC

## 2015-05-08 NOTE — Discharge Instructions (Addendum)
Information on my medicine - ELIQUIS (apixaban)  This medication education was reviewed with me or my healthcare representative as part of my discharge preparation.    Why was Eliquis prescribed for you? Eliquis was prescribed to treat blood clots that may have been found in the veins of your legs (deep vein thrombosis) or in your lungs (pulmonary embolism) and to reduce the risk of them occurring again.  What do You need to know about Eliquis ? The starting dose is 10 mg (two 5 mg tablets) taken TWICE daily for the FIRST SEVEN (7) DAYS, then on (enter date)  05/15/15  the dose is reduced to ONE 5 mg tablet taken TWICE daily.  Eliquis may be taken with or without food.   Try to take the dose about the same time in the morning and in the evening. If you have difficulty swallowing the tablet whole please discuss with your pharmacist how to take the medication safely.  Take Eliquis exactly as prescribed and DO NOT stop taking Eliquis without talking to the doctor who prescribed the medication.  Stopping may increase your risk of developing a new blood clot.  Refill your prescription before you run out.  After discharge, you should have regular check-up appointments with your healthcare provider that is prescribing your Eliquis.    What do you do if you miss a dose? If a dose of ELIQUIS is not taken at the scheduled time, take it as soon as possible on the same day and twice-daily administration should be resumed. The dose should not be doubled to make up for a missed dose.  Important Safety Information A possible side effect of Eliquis is bleeding. You should call your healthcare provider right away if you experience any of the following: ? Bleeding from an injury or your nose that does not stop. ? Unusual colored urine (red or dark brown) or unusual colored stools (red or black). ? Unusual bruising for unknown reasons. ? A serious fall or if you hit your head (even if there is no  bleeding).  Some medicines may interact with Eliquis and might increase your risk of bleeding or clotting while on Eliquis. To help avoid this, consult your healthcare provider or pharmacist prior to using any new prescription or non-prescription medications, including herbals, vitamins, non-steroidal anti-inflammatory drugs (NSAIDs) and supplements.  This website has more information on Eliquis (apixaban): http://www.eliquis.com/eliquis/home   Follow with Primary MD GREEN, [name], MD in 7 days   Get CBC, CMP, 2 view Chest X ray checked  by Primary MD next visit.    Activity: As tolerated with Full fall precautions use walker/cane & assistance as needed   Disposition Home     Diet:   Heart Healthy   with feeding assistance and aspiration precautions.  For Heart failure patients - Check your Weight same time everyday, if you gain over 2 pounds, or you develop in leg swelling, experience more shortness of breath or chest pain, call your Primary MD immediately. Follow Cardiac Low Salt Diet and 1.5 lit/day fluid restriction.   On your next visit with your primary care physician please Get Medicines reviewed and adjusted.   Please request your Prim.MD to go over all Hospital Tests and Procedure/Radiological results at the follow up, please get all Hospital records sent to your Prim MD by signing hospital release before you go home.   If you experience worsening of your admission symptoms, develop shortness of breath, life threatening emergency, suicidal or homicidal thoughts you  must seek medical attention immediately by calling 911 or calling your MD immediately  if symptoms less severe.  You Must read complete instructions/literature along with all the possible adverse reactions/side effects for all the Medicines you take and that have been prescribed to you. Take any new Medicines after you have completely understood and accpet all the possible adverse reactions/side effects.   Do  not drive, operating heavy machinery, perform activities at heights, swimming or participation in water activities or provide baby sitting services if your were admitted for syncope or siezures until you have seen by Primary MD or a Neurologist and advised to do so again.  Do not drive when taking Pain medications.    Do not take more than prescribed Pain, Sleep and Anxiety Medications  Special Instructions: If you have smoked or chewed Tobacco  in the last 2 yrs please stop smoking, stop any regular Alcohol  and or any Recreational drug use.  Wear Seat belts while driving.   Please note  You were cared for by a hospitalist during your hospital stay. If you have any questions about your discharge medications or the care you received while you were in the hospital after you are discharged, you can call the unit and asked to speak with the hospitalist on call if the hospitalist that took care of you is not available. Once you are discharged, your primary care physician will handle any further medical issues. Please note that NO REFILLS for any discharge medications will be authorized once you are discharged, as it is imperative that you return to your primary care physician (or establish a relationship with a primary care physician if you do not have one) for your aftercare needs so that they can reassess your need for medications and monitor your lab values.

## 2015-05-08 NOTE — Progress Notes (Signed)
  Echocardiogram 2D Echocardiogram has been performed.  Bailey [name] 05/08/2015, 10:40 AM

## 2015-05-08 NOTE — Progress Notes (Signed)
ANTICOAGULATION CONSULT NOTE - Initial Consult  Pharmacy Consult for apixaban Indication: pulmonary embolus     Heparin Dosing Weight:   Vital Signs: Temp: 97.9 F (36.6 C) (12/23 0630) Temp Source: Oral (12/23 0630) BP: 175/73 mmHg (12/23 0806) Pulse Rate: 53 (12/23 0806)  Labs:  Recent Labs  05/07/15 1209 05/07/15 1944 05/07/15 2325 05/08/15 0013 05/08/15 0615 05/08/15 0800  HGB 12.2  --   --   --  11.2*  --   HCT 35.5*  --   --   --  33.1*  --   PLT 175  --   --   --  145*  --   HEPARINUNFRC  --   --   --  0.70  --  0.86*  CREATININE 1.05*  --   --   --  1.04*  --   TROPONINI  --  <0.03 <0.03  --  <0.03  --     CrCl cannot be calculated (Unknown ideal weight.).   Medical History: Past Medical History  Diagnosis Date  . Osteoarthrosis, unspecified whether generalized or localized, forearm     bilaterally wrist  . Allergic rhinitis, cause unspecified   . Senile osteoporosis   . Malignant neoplasm of breast (female), unspecified site 1990    left. Had surgerey and chemo, but no radiation  . Acquired cyst of kidney     left  . Left cavernous carotid aneurysm 08/2008    1-1/51mm aneurysm of cavernous carotid artery  . Anemia, unspecified   . Unspecified gastritis and gastroduodenitis without mention of hemorrhage   . 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 Other and unspecified nonspecific immunological findings     positive ANA  . History of Epstein-Barr virus infection   . Herpes simplex without mention of complication   . Insomnia, unspecified   . Unspecified hypothyroidism   . Hyposmolality and/or hyponatremia   . Unspecified vitamin D deficiency   . Pure hypercholesterolemia   . Other B-complex deficiencies   . Osteoarthrosis of knee     bilaterally  . Closed fracture of unspecified part of vertebral column without mention of spinal cord injury     T7 s/p kyphoplasty, T12  . Unspecified essential  hypertension   . Pain in joint, site unspecified     severe, diffuse, chronic pain  . Osteoarthrosis, hip   . Edema 2015  . CKD stage 2 due to type 2 diabetes mellitus ([place]) 11/18/2014  . Neuropathy ([place])   . Positive ANA (antinuclear antibody)   . Insomnia   . Pulmonary embolism ([place]) 05/07/2015    Medications:  Scheduled:  . amLODipine  5 mg Oral Daily  . apixaban  10 mg Oral BID   Followed by  . [START ON 05/15/2015] apixaban  5 mg Oral BID  . atenolol  25 mg Oral Daily  . cholecalciferol  3,000 Units Oral Daily  . diclofenac  50 mg Oral BID  . feeding supplement (ENSURE ENLIVE)  237 mL Oral BID BM  . irbesartan  300 mg Oral Daily  . levothyroxine  112 mcg Oral QAC breakfast  . lidocaine  3 patch Transdermal Q24H  . pantoprazole  40 mg Oral Daily  . sodium chloride  3 mL Intravenous Q12H   Infusions:  . sodium chloride 100 mL/hr at 05/07/15 2228    Assessment: 81 yo female with PE will be switched from heparin to apixaban.  SCr 1.04, Hgb 11.2  and Plt 145 K.  Heparin level was 0.86.   Goal of Therapy:   Monitor platelets by anticoagulation protocol: Yes   Plan:  D/c heparin and heparin level Apixaban 10 mg po bid x 7 days, then 5 mg po bid Monitor for bleeding Avoid NSAID if possible  Sailor Hevia, Tsz-Yin 05/08/2015,9:01 AM

## 2015-05-08 NOTE — Progress Notes (Signed)
Patient Demographics:    Bailey [name], is a 81 y.o. female, DOB - April 05, 1927, [id number]  Admit date - 05/07/2015   Admitting Physician Ripudeep Krystal Eaton, MD  Outpatient Primary MD for the patient is GREEN, [name], MD  LOS - 1   Chief Complaint  Patient presents with  . Chest Pain  . Shortness of Breath        Subjective:    Bailey [name] today has, No headache, No chest pain, No abdominal pain - No Nausea, No new weakness tingling or numbness, No Cough - SOB.     Assessment  & Plan :    Principal Problem:   Pulmonary embolism (HCC) Active Problems:   Hypothyroidism   Essential hypertension   CKD (chronic kidney disease) stage 2, GFR 60-89 ml/min   Fracture, intertrochanteric, right femur (HCC)   GERD (gastroesophageal reflux disease)   Chest pain   Pulmonary embolus ([place])   1. Acute PE with evidence of right heart strain on CT scan. Patient currently symptom free and hemodynamically stable, was kept on heparin drip for 24 hours, will be transitioned to Eliquis. Pending 2-D echogram and lower extremity Doppler. If stable likely discharge in the morning. Reported negative 3.   2. History of hypothyroidism. Stable TSH continue home dose Synthroid.   3. Essential hypertension. Continue Norvasc, Avapro and atenolol. As needed hydralazine.   4. CK D stage II. Creatinine at baseline.   5. History of right intertrochanteric femoral fracture. Status post surgical correction over 2 months ago by Dr. Shaune Spittle, continue PT and follow-up with him post discharge.   6. GERD. On PPI continue.   Code Status : Full  Family Communication  : None present  Disposition Plan  : SNF in the morning  Consults  :  None  Procedures  :    CTA - PE,   TTE  Lower extremity venous duplex  DVT  Prophylaxis  :  Eliquis  Lab Results  Component Value Date   PLT 145* 05/08/2015    Inpatient Medications  Scheduled Meds: . amLODipine  5 mg Oral Daily  . apixaban  10 mg Oral BID   Followed by  . [START ON 05/15/2015] apixaban  5 mg Oral BID  . atenolol  25 mg Oral Daily  . cholecalciferol  3,000 Units Oral Daily  . diclofenac  50 mg Oral BID  . feeding supplement (ENSURE ENLIVE)  237 mL Oral BID BM  . irbesartan  300 mg Oral Daily  . levothyroxine  112 mcg Oral QAC breakfast  . lidocaine  3 patch Transdermal Q24H  . pantoprazole  40 mg Oral Daily  . sodium chloride  3 mL Intravenous Q12H   Continuous Infusions: . sodium chloride 100 mL/hr at 05/08/15 0907   PRN Meds:.acetaminophen **OR** acetaminophen, albuterol, alum & mag hydroxide-simeth, HYDROmorphone (DILAUDID) injection, ipratropium, ondansetron **OR** ondansetron (ZOFRAN) IV, oxyCODONE-acetaminophen, zolpidem  Antibiotics  :     Anti-infectives    None        Objective:   Filed Vitals:   05/07/15 2000 05/07/15 2159 05/08/15 0630 05/08/15 0806  BP:  165/88 190/67 175/73  Pulse:  55 53 53  Temp:  98 F (36.7 C) 97.9 F (36.6 C)  TempSrc:  Oral Oral   Resp:  15 14   SpO2: 99% 100% 99%     Wt Readings from Last 3 Encounters:  04/16/15 61.236 kg (135 lb)  03/30/15 58.968 kg (130 lb)  03/18/15 58.968 kg (130 lb)     Intake/Output Summary (Last 24 hours) at 05/08/15 1158 Last data filed at 05/08/15 [id number]  Gross per 24 hour  Intake 2537.17 ml  Output    700 ml  Net 1837.17 ml     Physical Exam  Awake Alert, Oriented X 3, No new F.N deficits, Normal affect [place].AT,PERRAL Supple Neck,No JVD, No cervical lymphadenopathy appriciated.  Symmetrical Chest wall movement, Good air movement bilaterally, CTAB RRR,No Gallops,Rubs or new Murmurs, No Parasternal Heave +ve B.Sounds, Abd Soft, No tenderness, No organomegaly appriciated, No rebound - guarding or rigidity. No Cyanosis, Clubbing or edema, No new  Rash or bruise       Data Review:   Micro Results No results found for this or any previous visit (from the past 240 hour(s)).  Radiology Reports Dg Chest 2 View  05/07/2015  CLINICAL DATA:  Chest pain with headache and shortness of breath for 2 days. EXAM: CHEST  2 VIEW COMPARISON:  None. FINDINGS: Mild cardiomegaly. Tortuosity of the thoracic aorta. No confluent airspace opacities or effusions. No acute bony abnormality. Multiple thoracic compression fractures with multilevel vertebroplasty changes. IMPRESSION: Cardiomegaly.  No active disease. Electronically Signed   By: Rolm Baptise M.D.   On: 05/07/2015 12:40   Ct Angio Chest Pe W/cm &/or Wo Cm  05/07/2015  CLINICAL DATA:  Chest pain and shortness of breath, history of recent hip replacement approximately 1 month ago EXAM: CT ANGIOGRAPHY CHEST WITH CONTRAST TECHNIQUE: Multidetector CT imaging of the chest was performed using the standard protocol during bolus administration of intravenous contrast. Multiplanar CT image reconstructions and MIPs were obtained to evaluate the vascular anatomy. CONTRAST:  181mL OMNIPAQUE IOHEXOL 350 MG/ML SOLN COMPARISON:  None. No focal infiltrate is noted. FINDINGS: The lungs are well aerated and demonstrates some dependent atelectatic changes. No focal confluent infiltrate or sizable nodule is noted. No effusion or pneumothorax is seen. The thoracic inlet is within normal limits. Limited opacification of the aorta is seen. Mild calcifications are noted without definitive aneurysmal dilatation. The pulmonary artery demonstrates a normal branching pattern. There are changes consistent with right lower lobe pulmonary emboli. The cardiac structures show no definitive right heart strain. Visualized upper abdomen is within normal limits. The osseous structures show changes of prior vertebral augmentation as well as other non treated compression deformities. Review of the MIP images confirms the above findings.  IMPRESSION: Right lower lobe pulmonary emboli without significant right heart strain. Changes of multiple thoracic compression deformities with evidence of prior vertebral augmentation. Electronically Signed   By: Inez Catalina M.D.   On: 05/07/2015 15:29     CBC  Recent Labs Lab 05/07/15 1209 05/08/15 0615  WBC 7.7 4.8  HGB 12.2 11.2*  HCT 35.5* 33.1*  PLT 175 145*  MCV 96.7 97.4  MCH 33.2 32.9  MCHC 34.4 33.8  RDW 12.6 12.7    Chemistries   Recent Labs Lab 05/07/15 1209 05/08/15 0615  NA 132* 136  K 4.7 4.0  CL 100* 107  CO2 22 21*  GLUCOSE 106* 91  BUN 22* 20  CREATININE 1.05* 1.04*  CALCIUM 8.8* 7.9*   ------------------------------------------------------------------------------------------------------------------ CrCl cannot be calculated (Unknown ideal weight.). ------------------------------------------------------------------------------------------------------------------ No results for input(s): HGBA1C in the last 72 hours. ------------------------------------------------------------------------------------------------------------------  No results for input(s): CHOL, HDL, LDLCALC, TRIG, CHOLHDL, LDLDIRECT in the last 72 hours. ------------------------------------------------------------------------------------------------------------------  Recent Labs  05/07/15 1944  TSH 1.137   ------------------------------------------------------------------------------------------------------------------ No results for input(s): VITAMINB12, FOLATE, FERRITIN, TIBC, IRON, RETICCTPCT in the last 72 hours.  Coagulation profile No results for input(s): INR, PROTIME in the last 168 hours.  No results for input(s): DDIMER in the last 72 hours.  Cardiac Enzymes  Recent Labs Lab 05/07/15 1944 05/07/15 2325 05/08/15 0615  TROPONINI <0.03 <0.03 <0.03    ------------------------------------------------------------------------------------------------------------------ Invalid input(s): POCBNP   Time Spent in minutes   35   SINGH,PRASHANT K M.D on 05/08/2015 at 11:58 AM  Between 7am to 7pm - Pager - [phone]  After 7pm go to [email or web address] - password [facility]  [facility] -  Office  [phone]

## 2015-05-08 NOTE — Progress Notes (Signed)
VASCULAR LAB PRELIMINARY  PRELIMINARY  PRELIMINARY  PRELIMINARY    Preliminary report:  Bilateral lower extremity venous duplex completed. Bilateral:  No evidence of DVT, superficial thrombosis, or Baker's Cyst.   Janifer Adie, RVT, RDMS 05/08/2015, 12:05 PM

## 2015-05-08 NOTE — Progress Notes (Signed)
Nutrition Brief Note  Patient identified on the Malnutrition Screening Tool (MST) Report  Wt Readings from Last 15 Encounters:  05/08/15 135 lb (61.236 kg)  04/16/15 135 lb (61.236 kg)  03/30/15 130 lb (58.968 kg)  03/18/15 130 lb (58.968 kg)  11/18/14 130 lb (58.968 kg)  07/22/14 125 lb (56.7 kg)  03/25/14 124 lb (56.246 kg)  11/26/13 121 lb (54.885 kg)  10/22/13 121 lb (54.885 kg)    Body mass index is 22.47 kg/(m^2). Patient meets criteria for [place] based on current BMI.   Current diet order is Heart Healthy. Labs and medications reviewed.   No nutrition interventions warranted at this time. If nutrition issues arise, please consult RD.   Arthur Holms, RD, LDN Pager #: [phone] After-Hours Pager #: [phone]

## 2015-05-08 NOTE — Progress Notes (Signed)
ANTICOAGULATION CONSULT NOTE - Follow Up Consult  Pharmacy Consult for Heparin  Indication: pulmonary embolus  Vital Signs: Temp: 98 F (36.7 C) (12/22 2159) Temp Source: Oral (12/22 2159) BP: 165/88 mmHg (12/22 2159) Pulse Rate: 55 (12/22 2159)  Labs:  Recent Labs  05/07/15 1209 05/07/15 1944 05/07/15 2325 05/08/15 0013  HGB 12.2  --   --   --   HCT 35.5*  --   --   --   PLT 175  --   --   --   HEPARINUNFRC  --   --   --  0.70  CREATININE 1.05*  --   --   --   TROPONINI  --  <0.03 <0.03  --     Assessment: Heparin for new onset PE, initial heparin level on upper end of therapeutic range  Goal of Therapy:  Heparin level 0.3-0.7 units/ml Monitor platelets by anticoagulation protocol: Yes   Plan:  -Continue heparin at 1000 units/hr -0800 HL -Daily CBC/HL -Monitor for bleeding  Narda Bonds 05/08/2015,12:51 AM

## 2015-05-08 NOTE — Care Management Note (Signed)
Case Management Note  Patient Details  Name: Bailey [name] MRN: [id number] Date of Birth: 1927-04-23  Subjective/Objective:   Per pt eval rec SNF, CSW referral.  Patient is from [facility] [place] [facility]. NCM gave patient 30 day savings card for Eliquis. Patient has [employer].                 Action/Plan:   Expected Discharge Date:                  Expected Discharge Plan:  Skilled Nursing Facility  In-House Referral:  Clinical Social Work  Discharge planning Services  CM Consult  Post Acute Care Choice:    Choice offered to:     DME Arranged:    DME Agency:     HH Arranged:    [place] Agency:     Status of Service:  Completed, signed off  Medicare Important Message Given:    Date Medicare IM Given:    Medicare IM give by:    Date Additional Medicare IM Given:    Additional Medicare Important Message give by:     If discussed at [place] of Stay Meetings, dates discussed:    Additional Comments:  [name], RN 05/08/2015, 11:34 AM

## 2015-05-08 NOTE — Evaluation (Signed)
Physical Therapy Evaluation Patient Details Name: Bailey [name] MRN: [id number] DOB: 1926/06/17 Today's Date: 05/08/2015   History of Present Illness  81 y.o. female admitted to [facility] on 05/07/15 from SNF for rehab of her R hip fx (s/p IM nail on 03/18/15).  She was admitted this time with chest pain and SOB and found to have a PE.  Pt with significant PMHx of sensory motor neuropathy of both legs, OA bil knees s/p injection 03/18/15 in L knee, T7 fx s/p kyphoplasty, t12 fx, CKD 2, L mastectomy with lymph node removal- lympedema (restricted extremity), ORIF L hip fx 06/13/07.    Clinical Impression  Pt was able to get up and walk short, in-room distance with PT and preform LE exercises.  She was at rehab for R hip IM Nail PTA and she would benefit from returning to SNF to complete her rehab as she is a high fall risk and still requires assist for balance when up on her feet.   PT to follow acutely for deficits listed below.       Follow Up Recommendations SNF    Equipment Recommendations  None recommended by PT    Recommendations for Other Services   NA    Precautions / Restrictions Precautions Precautions: Fall Precaution Comments: pt with h/o falls Restrictions Weight Bearing Restrictions: No      Mobility  Bed Mobility Overal bed mobility: Needs Assistance Bed Mobility: Supine to Sit;Sit to Supine     Supine to sit: Supervision;HOB elevated Sit to supine: Min assist   General bed mobility comments: supervision to get to EOB with HOB elevated and use of the bed rail.  Assist needed to get back into bed to lift both feet back up.   Transfers Overall transfer level: Needs assistance Equipment used: Rolling walker (2 wheeled) Transfers: Sit to/from [facility] Sit to Stand: Min guard Stand pivot transfers: Min guard       General transfer comment: Min guard assist to steady trunk for balance with and without RW.    Ambulation/Gait Ambulation/Gait  assistance: Min assist Ambulation Distance (Feet): 20 Feet Assistive device: Rolling walker (2 wheeled) Gait Pattern/deviations: Step-through pattern;Shuffle Gait velocity: decreased             Balance Overall balance assessment: Needs assistance Sitting-balance support: Feet supported;No upper extremity supported Sitting balance-Leahy Scale: Good     Standing balance support: Bilateral upper extremity supported Standing balance-Leahy Scale: Poor                               Pertinent Vitals/Pain Pain Assessment: No/denies pain    Home Living Family/patient expects to be discharged to:: Skilled nursing facility (return to finish rehab of her right hip)                      Prior Function Level of Independence: Needs assistance   Gait / Transfers Assistance Needed: Pt was walking "to the nursing station" with PT and RW at SNF.            Hand Dominance   Dominant Hand: Right    Extremity/Trunk Assessment   Upper Extremity Assessment: Generalized weakness           Lower Extremity Assessment: RLE deficits/detail RLE Deficits / Details: both legs weak, however, right leg more pronounced than left leg due to recent fx and surgery.  Ankle at least 3/5, knee 3-/5, hip  2+/5    Cervical / Trunk Assessment: Other exceptions;Kyphotic  Communication   Communication: No difficulties (she is originally from [place])  Cognition Arousal/Alertness: Awake/alert Behavior During Therapy: WFL for tasks assessed/performed Overall Cognitive Status: Within Functional Limits for tasks assessed                         Exercises Total Joint Exercises Quad Sets: AROM;Both;10 reps Heel Slides: AAROM;Both;10 reps Hip ABduction/ADduction: AROM;AAROM;10 reps;Left;Right General Exercises - Lower Extremity Toe Raises: AROM;Both;10 reps Heel Raises: AROM;Both;10 reps      Assessment/Plan    PT Assessment Patient needs continued PT services   PT Diagnosis Difficulty walking;Abnormality of gait;Generalized weakness;Acute pain   PT Problem List Decreased strength;Decreased activity tolerance;Decreased balance;Decreased mobility;Decreased knowledge of use of DME;Pain;Cardiopulmonary status limiting activity  PT Treatment Interventions DME instruction;Gait training;Functional mobility training;Therapeutic activities;Therapeutic exercise;Balance training;Neuromuscular re-education;Patient/family education;Modalities   PT Goals (Current goals can be found in the Care Plan section) Acute Rehab PT Goals Patient Stated Goal: to go back to rehab soon PT Goal Formulation: With patient Time For Goal Achievement: 05/22/15 Potential to Achieve Goals: Good    Frequency Min 2X/week           End of Session   Activity Tolerance: Patient limited by fatigue Patient left: in bed;with call bell/phone within reach;Other (comment) (with echo coming in to preform test)           Time: [id number] PT Time Calculation (min) (ACUTE ONLY): 34 min   Charges:   PT Evaluation $Initial PT Evaluation Tier I: 1 Procedure PT Treatments $Therapeutic Activity: 8-22 mins        Abree Romick B. Daisey Caloca, PT, DPT [phone]   05/08/2015, 10:22 AM

## 2015-05-09 DIAGNOSIS — E038 Other specified hypothyroidism: Secondary | ICD-10-CM

## 2015-05-09 LAB — CBC
HCT: 35.7 % — ABNORMAL LOW (ref 36.0–46.0)
Hemoglobin: 12.2 g/dL (ref 12.0–15.0)
MCH: 33.2 pg (ref 26.0–34.0)
MCHC: 34.2 g/dL (ref 30.0–36.0)
MCV: 97 fL (ref 78.0–100.0)
PLATELETS: 175 10*3/uL (ref 150–400)
RBC: 3.68 MIL/uL — ABNORMAL LOW (ref 3.87–5.11)
RDW: 12.6 % (ref 11.5–15.5)
WBC: 5.2 10*3/uL (ref 4.0–10.5)

## 2015-05-09 MED ORDER — ZOLPIDEM TARTRATE 10 MG PO TABS: ORAL_TABLET | ORAL | Status: DC

## 2015-05-09 MED ORDER — APIXABAN 5 MG PO TABS: 10.0000 mg | ORAL_TABLET | Freq: Two times a day (BID) | ORAL | Status: DC

## 2015-05-09 MED ORDER — OXYCODONE-ACETAMINOPHEN 5-325 MG PO TABS: 1.0000 | ORAL_TABLET | Freq: Four times a day (QID) | ORAL | Status: DC | PRN

## 2015-05-09 MED ORDER — APIXABAN 5 MG PO TABS: 5.0000 mg | ORAL_TABLET | Freq: Two times a day (BID) | ORAL | Status: DC

## 2015-05-09 NOTE — NC FL2 (Signed)
[place] LEVEL OF CARE SCREENING TOOL     IDENTIFICATION  Patient Name: Bailey [name] Birthdate: January 06, 1927 Sex: female Admission Date (Current Location): 05/07/2015  [facility] and [place] Number:  Herbalist and Address:  [facility], [place] [address], [place], [place] [address]      Provider Number: [id number]  Attending Physician Name and Address:  Thurnell Lose, MD  Relative Name and Phone Number:       Current Level of Care: Hospital Recommended Level of Care: [place] (Return to Assisted Living) Prior Approval Number:    Date Approved/Denied:   PASRR Number:    Discharge Plan: Other (Comment) (Assisted Living)    Current Diagnoses: Patient Active Problem List   Diagnosis Date Noted  . Chest pain 05/07/2015  . Pulmonary embolism ([place]) 05/07/2015  . Pulmonary embolus ([place]) 05/07/2015  . Chronic pain syndrome 04/16/2015  . Muscle spasm of right leg 03/31/2015  . Fracture of multiple pubic rami ([place]) 03/30/2015  . UTI (urinary tract infection) 03/27/2015  . Protein-calorie malnutrition ([place]) 03/27/2015  . Bilirubinemia 03/27/2015  . GERD (gastroesophageal reflux disease) 03/24/2015  . Constipation 03/24/2015  . Postoperative anemia due to acute blood loss 03/19/2015  . Fracture, intertrochanteric, right femur ([place])   . CKD (chronic kidney disease) stage 2, GFR 60-89 ml/min 11/18/2014  . Osteoarthritis 03/25/2014  . Osteoarthrosis, forearm   . Senile osteoporosis   . Malignant neoplasm of breast (female), unspecified site   . Anemia   . Insomnia   . Hypothyroidism   . Pure hypercholesterolemia   . Closed fracture of unspecified part of vertebral column without mention of spinal cord injury   . Essential hypertension   . Osteoarthrosis, hip   . Edema     Orientation RESPIRATION BLADDER Height & Weight    Self, Time, Situation, Place  Normal Continent   135 lbs.  BEHAVIORAL SYMPTOMS/MOOD  NEUROLOGICAL BOWEL NUTRITION STATUS   (N/A)  (N/A) Continent  (Cardiac)  AMBULATORY STATUS COMMUNICATION OF NEEDS Skin   Extensive Assist Verbally Normal                       Personal Care Assistance Level of Assistance  Bathing, Dressing, Feeding Bathing Assistance: Limited assistance Feeding assistance: Independent Dressing Assistance: Limited assistance     Functional Limitations Info             SPECIAL CARE FACTORS FREQUENCY  PT (By licensed PT)     PT Frequency: 5x/week              Contractures      Additional Factors Info  Code Status, Allergies Code Status Info: DNR Allergies Info: Aspirin, Penicillins           Current Medications (05/09/2015):    Discharge Medications: Medication List    STOP taking these medications       aspirin 325 MG EC tablet     diclofenac 50 MG EC tablet  Commonly known as: VOLTAREN      TAKE these medications       amLODipine 5 MG tablet  Commonly known as: NORVASC  One daly to control BP     apixaban 5 MG Tabs tablet  Commonly known as: ELIQUIS  Take 2 tablets (10 mg total) by mouth 2 (two) times daily.     apixaban 5 MG Tabs tablet  Commonly known as: ELIQUIS  Take 1 tablet (5 mg total)  by mouth 2 (two) times daily.  Start taking on: 05/15/2015     atenolol 25 MG tablet  Commonly known as: TENORMIN  Take one tablet by mouth once daily for blood pressure     cholecalciferol 1000 UNITS tablet  Commonly known as: VITAMIN D  Take 3,000 Units by mouth daily.     denosumab 60 MG/ML Soln injection  Commonly known as: PROLIA  Inject 60 mg into the skin every 6 (six) months. Administer in upper arm, thigh, or abdomen     irbesartan 300 MG tablet  Commonly known as: AVAPRO  One daily to control BP     levothyroxine 112 MCG tablet  Commonly known as: SYNTHROID, LEVOTHROID  Take one tablet by mouth once daily for thyroid supplement      lidocaine 5 %  Commonly known as: LIDODERM  Place 1-3 patches onto the skin daily. Apply in AM and remove in PM. Remove & Discard patch within 12 hours or as directed by MD     omeprazole 20 MG capsule  Commonly known as: PRILOSEC  Take 1 capsule (20 mg total) by mouth daily.     oxyCODONE-acetaminophen 5-325 MG tablet  Commonly known as: PERCOCET/ROXICET  Take 1 tablet by mouth every 6 (six) hours as needed. Take one tablet at 8 AM, 1 PM, 6 PM, and 10 PM. May have additional tablet between the hours of 1 AM and 7 AM if she wakens with pain.     prochlorperazine 10 MG tablet  Commonly known as: COMPAZINE  Take 1 tablet (10 mg total) by mouth every 6 (six) hours as needed for nausea or vomiting.     VITAMIN B-12 CR PO  Take 1 tablet by mouth daily.     zolpidem 10 MG tablet  Commonly known as: AMBIEN  Take one tablet by mouth by mouth at bedtime for sleep           Relevant Imaging Results:  Relevant Lab Results:   Additional Information  SS # [id number]  Benard Halsted, LCSWA

## 2015-05-09 NOTE — Progress Notes (Signed)
Patient will DC to: [facility] Anticipated DC date: 05/09/15 Family notified: N/A Transport by: PTAR 12pm  CSW signing off.  Cedric Fishman, [place] Social Worker [phone]

## 2015-05-09 NOTE — NC FL2 (Signed)
[place] LEVEL OF CARE SCREENING TOOL     IDENTIFICATION  Patient Name: Bailey [name] Birthdate: [birth year] Sex: female Admission Date (Current Location): 05/07/2015  [facility] and [place] Number:  Herbalist and Address:  [facility], [place] [address] [place], [place] [address]      Provider Number: [id number]  Attending Physician Name and Address:  Thurnell Lose, MD  Relative Name and Phone Number:       Current Level of Care: Hospital Recommended Level of Care: [place] (Return to Assisted Living) Prior Approval Number:    Date Approved/Denied:   PASRR Number:    Discharge Plan: Other (Comment) (Assisted Living)    Current Diagnoses: Patient Active Problem List   Diagnosis Date Noted  . Chest pain 05/07/2015  . Pulmonary embolism ([place]) 05/07/2015  . Pulmonary embolus ([place]) 05/07/2015  . Chronic pain syndrome 04/16/2015  . Muscle spasm of right leg 03/31/2015  . Fracture of multiple pubic rami ([place]) 03/30/2015  . UTI (urinary tract infection) 03/27/2015  . Protein-calorie malnutrition ([place]) 03/27/2015  . Bilirubinemia 03/27/2015  . GERD (gastroesophageal reflux disease) 03/24/2015  . Constipation 03/24/2015  . Postoperative anemia due to acute blood loss 03/19/2015  . Fracture, intertrochanteric, right femur ([place])   . CKD (chronic kidney disease) stage 2, GFR 60-89 ml/min 11/18/2014  . Osteoarthritis 03/25/2014  . Osteoarthrosis, forearm   . Senile osteoporosis   . Malignant neoplasm of breast (female), unspecified site   . Anemia   . Insomnia   . Hypothyroidism   . Pure hypercholesterolemia   . Closed fracture of unspecified part of vertebral column without mention of spinal cord injury   . Essential hypertension   . Osteoarthrosis, hip   . Edema     Orientation RESPIRATION BLADDER Height & Weight    Self, Time, Situation, Place  Normal Continent   135 lbs.  BEHAVIORAL SYMPTOMS/MOOD  NEUROLOGICAL BOWEL NUTRITION STATUS   (N/A)  (N/A) Continent  (Cardiac)  AMBULATORY STATUS COMMUNICATION OF NEEDS Skin   Extensive Assist Verbally Normal                       Personal Care Assistance Level of Assistance  Bathing, Dressing, Feeding Bathing Assistance: Limited assistance Feeding assistance: Independent Dressing Assistance: Limited assistance     Functional Limitations Info             SPECIAL CARE FACTORS FREQUENCY  PT (By licensed PT)     PT Frequency: 5x/week              Contractures      Additional Factors Info  Code Status, Allergies Code Status Info: DNR Allergies Info: Aspirin, Penicillins           Current Medications (05/09/2015):  This is the current hospital active medication list Current Facility-Administered Medications  Medication Dose Route Frequency Provider Last Rate Last Dose  . 0.9 %  sodium chloride infusion   Intravenous Continuous Fredia Sorrow, MD   Stopped at 05/09/15 1034  . acetaminophen (TYLENOL) tablet 650 mg  650 mg Oral Q6H PRN Ripudeep Krystal Eaton, MD       Or  . acetaminophen (TYLENOL) suppository 650 mg  650 mg Rectal Q6H PRN Ripudeep K Rai, MD      . albuterol (PROVENTIL) (2.5 MG/3ML) 0.083% nebulizer solution 2.5 mg  2.5 mg Nebulization Q4H PRN Ripudeep Krystal Eaton, MD      . alum &  mag hydroxide-simeth (MAALOX/MYLANTA) 200-200-20 MG/5ML suspension 30 mL  30 mL Oral Q6H PRN Ripudeep K Rai, MD      . amLODipine (NORVASC) tablet 5 mg  5 mg Oral Daily Ripudeep Krystal Eaton, MD   5 mg at 05/09/15 0936  . apixaban (ELIQUIS) tablet 10 mg  10 mg Oral BID Thurnell Lose, MD   10 mg at 05/09/15 0935   Followed by  . [START ON 05/15/2015] apixaban (ELIQUIS) tablet 5 mg  5 mg Oral BID Thurnell Lose, MD      . atenolol (TENORMIN) tablet 25 mg  25 mg Oral Daily Ripudeep Krystal Eaton, MD   25 mg at 05/09/15 0936  . cholecalciferol (VITAMIN D) tablet 3,000 Units  3,000 Units Oral Daily Ripudeep Krystal Eaton, MD   3,000 Units at 05/09/15 0935  .  diclofenac (VOLTAREN) EC tablet 50 mg  50 mg Oral BID Ripudeep Krystal Eaton, MD   50 mg at 05/09/15 0935  . feeding supplement (ENSURE ENLIVE) (ENSURE ENLIVE) liquid 237 mL  237 mL Oral BID BM Ripudeep K Rai, MD   237 mL at 05/09/15 0940  . HYDROmorphone (DILAUDID) injection 1 mg  1 mg Intravenous Q4H PRN Ripudeep Krystal Eaton, MD   1 mg at 05/09/15 0326  . ipratropium (ATROVENT) nebulizer solution 0.5 mg  0.5 mg Nebulization PRN Ripudeep K Rai, MD      . irbesartan (AVAPRO) tablet 300 mg  300 mg Oral Daily Ripudeep Krystal Eaton, MD   300 mg at 05/09/15 0936  . levothyroxine (SYNTHROID, LEVOTHROID) tablet 112 mcg  112 mcg Oral QAC breakfast Ripudeep Krystal Eaton, MD   112 mcg at 05/09/15 0811  . lidocaine (LIDODERM) 5 % 3 patch  3 patch Transdermal Q24H Ripudeep Krystal Eaton, MD   3 patch at 05/09/15 [phone]  . ondansetron (ZOFRAN) tablet 4 mg  4 mg Oral Q6H PRN Ripudeep K Rai, MD       Or  . ondansetron (ZOFRAN) injection 4 mg  4 mg Intravenous Q6H PRN Ripudeep K Rai, MD      . oxyCODONE-acetaminophen (PERCOCET/ROXICET) 5-325 MG per tablet 1 tablet  1 tablet Oral Q6H PRN Ripudeep Krystal Eaton, MD   1 tablet at 05/09/15 0603  . pantoprazole (PROTONIX) EC tablet 40 mg  40 mg Oral Daily Ripudeep Krystal Eaton, MD   40 mg at 05/09/15 0934  . sodium chloride 0.9 % injection 3 mL  3 mL Intravenous Q12H Ripudeep K Rai, MD   3 mL at 05/07/15 2205  . zolpidem (AMBIEN) tablet 5 mg  5 mg Oral QHS PRN Ripudeep Krystal Eaton, MD   5 mg at 05/08/15 2308     Discharge Medications: Please see discharge summary for a list of discharge medications.  Relevant Imaging Results:  Relevant Lab Results:   Additional Information  SS # [id number]  Benard Halsted, LCSWA

## 2015-05-09 NOTE — Progress Notes (Signed)
05/09/15 Patient going back to [facility] today, IV site removed and telemetry removed, and Social worker  making transport arrangements, discharge instructions discussed with patient.

## 2015-05-09 NOTE — Discharge Summary (Signed)
Bailey [name], is a 81 y.o. female  DOB [birth year]  MRN [id number].  Admission date:  05/07/2015  Admitting Physician  Ripudeep Krystal Eaton, MD  Discharge Date:  05/09/2015   Primary MD  Estill Dooms, MD  Recommendations for primary care physician for things to follow:   Monitor CBC, BMP in 5-7 days. Outpatient orthopedic follow-up per Dr. Delfino Lovett for recent hip surgery.   Admission Diagnosis  SOB (shortness of breath) [R06.02] Chest pain, unspecified chest pain type [R07.9] Other acute pulmonary embolism without acute cor pulmonale (HCC) [I26.99]   Discharge Diagnosis  SOB (shortness of breath) [R06.02] Chest pain, unspecified chest pain type [R07.9] Other acute pulmonary embolism without acute cor pulmonale (HCC) [I26.99]     Principal Problem:   Pulmonary embolism (HCC) Active Problems:   Hypothyroidism   Essential hypertension   CKD (chronic kidney disease) stage 2, GFR 60-89 ml/min   Fracture, intertrochanteric, right femur (HCC)   GERD (gastroesophageal reflux disease)   Chest pain   Pulmonary embolus [facility])      Past Medical History  Diagnosis Date  . Osteoarthrosis, unspecified whether generalized or localized, forearm     bilaterally wrist  . Allergic rhinitis, cause unspecified   . Senile osteoporosis   . Malignant neoplasm of breast (female), unspecified site 1990    left. Had surgerey and chemo, but no radiation  . Acquired cyst of kidney     left  . 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 Anemia, unspecified   . Unspecified gastritis and gastroduodenitis without mention of hemorrhage   . 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 Other and unspecified nonspecific immunological findings    positive ANA  . History of Epstein-Barr virus infection   . Herpes simplex without mention of complication   . Insomnia, unspecified   . Unspecified hypothyroidism   . Hyposmolality and/or hyponatremia   . Unspecified vitamin D deficiency   . Pure hypercholesterolemia   . Other B-complex deficiencies   . Osteoarthrosis of knee     bilaterally  . Closed fracture of unspecified part of vertebral column without mention of spinal cord injury     T7 s/p kyphoplasty, T12  . Unspecified essential hypertension   . Pain in joint, site unspecified     severe, diffuse, chronic pain  . Osteoarthrosis, hip   . Edema 2015  . CKD stage 2 due to type 2 diabetes mellitus ([place]) 11/18/2014  . Neuropathy ([place])   . Positive ANA (antinuclear antibody)   . Insomnia   . Pulmonary embolism ([place]) 05/07/2015    Past Surgical History  Procedure Laterality Date  . Dilation and curettage of uterus  X 2  . Middle ear surgery Bilateral 1938  . Mastectomy Left 04/29/1989  . Tonsillectomy  1968  . Nasal sinus surgery  1990 & 2000  . Laparoscopic cholecystectomy  09/20/2006  . Orif hip fracture Left 06/13/2007  following a syncopal episode  . Kyphoplasty  07/03/2010    T12  . Kyphoplasty      C7  . Femur im nail Right 03/18/2015    Procedure: INTRAMEDULLARY (IM) NAIL FEMORAL;  Surgeon: Rod Can, MD;  Location: [facility] ORS;  Service: Orthopedics;  Laterality: Right;  . Fracture surgery         HPI  from the history and physical done on the day of admission:   Patient is a 81 year old female with osteoarthritis, hypertension, hyperlipidemia, chronic anemia, CKD stage II, who is currently in a rehabilitation facility for recent right femur fracture status post repair on 03/18/15 presented to ED with acute onset of the chest pain. History was obtained from the patient who reported that she had sudden onset of shortness of breath and chest pain. She described the chest pain as substernal radiating to the back in  the interscapular area. BP was checked and was noted to be down to 105/64 with tachycardia. O2 sats were 98% on room air. Patient was seen by her PCP and was found to be pale and mildly diaphoretic. Patient was sent to the ED for further workup. At the time of my encounter chest pain and shortness of breath is completely resolved. ED workup showed CT angiogram of the chest with right lower lobe pulmonary emboli without significant right heart strain. CBC, BMET unremarkable, troponin 2 negative.Patient denies any recent leg swelling or worsening pain.      Hospital Course:     1. Acute PE with evidence of right heart strain on CT scan. Patient currently symptom free and hemodynamically stable, was kept on heparin drip for 24 hours, will be transitioned to Eliquis. Stable 2-D echogram and lower extremity Doppler without any evidence of right heart strain on echogram or acute DVT on ultrasound. Troponin negative 3 sets, she will be discharged on Eliquis, kindly note the dose written below. Aspirin will be stopped at this time.   2. History of hypothyroidism. Stable TSH continue home dose Synthroid.   3. Essential hypertension. Continue Norvasc, Avapro and atenolol. As needed hydralazine.   4. CK D stage II. Creatinine at baseline.   5. History of right intertrochanteric femoral fracture. Status post surgical correction over 2 months ago by Dr. Lillia Corporal, continue PT and follow-up with him post discharge.   6. GERD. On PPI continue.        Discharge Condition: Stable  Follow UP  Follow-up Information    Follow up with GREEN, [name], MD. Schedule an appointment as soon as possible for a visit in 1 week.   Specialty:  Internal Medicine   Contact information:   [place] [address] [phone]       Follow up with Swinte[place], Horald Pollen, MD. Schedule an appointment as soon as possible for a visit in 1 week.   Specialty:  Orthopedic Surgery   Contact  information:   [address] [place] [address] [phone]        Consults obtained - None  Diet and Activity recommendation: See Discharge Instructions below  Discharge Instructions       Discharge Instructions    Diet - low sodium heart healthy    Complete by:  As directed      Discharge instructions    Complete by:  As directed   Follow with Primary MD GREEN, [name], MD in 7 days   Get CBC, CMP, 2 view Chest X ray checked  by Primary MD next visit.    Activity: As tolerated with Full fall precautions use walker/cane & assistance as needed   Disposition Home     Diet:   Heart Healthy   with feeding assistance and aspiration precautions.  For Heart failure patients - Check your Weight same time everyday, if you gain over 2 pounds, or you develop in leg swelling, experience more shortness of breath or chest pain, call your Primary MD immediately. Follow Cardiac Low Salt Diet and 1.5 lit/day fluid restriction.   On your next visit with your primary care physician please Get Medicines reviewed and adjusted.   Please request your Prim.MD to go over all Hospital Tests and Procedure/Radiological results at the follow up, please get all Hospital records sent to your Prim MD by signing hospital release before you go home.   If you experience worsening of your admission symptoms, develop shortness of breath, life threatening emergency, suicidal or homicidal thoughts you must seek medical attention immediately by calling 911 or calling your MD immediately  if symptoms less severe.  You Must read complete instructions/literature along with all the possible adverse reactions/side effects for all the Medicines you take and that have been prescribed to you. Take any new Medicines after you have completely understood and accpet all the possible adverse reactions/side effects.   Do not drive, operating heavy machinery, perform activities at heights, swimming or  participation in water activities or provide baby sitting services if your were admitted for syncope or siezures until you have seen by Primary MD or a Neurologist and advised to do so again.  Do not drive when taking Pain medications.    Do not take more than prescribed Pain, Sleep and Anxiety Medications  Special Instructions: If you have smoked or chewed Tobacco  in the last 2 yrs please stop smoking, stop any regular Alcohol  and or any Recreational drug use.  Wear Seat belts while driving.   Please note  You were cared for by a hospitalist during your hospital stay. If you have any questions about your discharge medications or the care you received while you were in the hospital after you are discharged, you can call the unit and asked to speak with the hospitalist on call if the hospitalist that took care of you is not available. Once you are discharged, your primary care physician will handle any further medical issues. Please note that NO REFILLS for any discharge medications will be authorized once you are discharged, as it is imperative that you return to your primary care physician (or establish a relationship with a primary care physician if you do not have one) for your aftercare needs so that they can reassess your need for medications and monitor your lab values.     Increase activity slowly    Complete by:  As directed              Discharge Medications       Medication List    STOP taking these medications        aspirin 325 MG EC tablet     diclofenac 50 MG EC tablet  Commonly known as:  VOLTAREN      TAKE these medications        amLODipine 5 MG tablet  Commonly known as:  NORVASC  One daly to control BP     apixaban 5 MG Tabs tablet  Commonly known as:  ELIQUIS  Take 2 tablets (10 mg total) by mouth  2 (two) times daily.     apixaban 5 MG Tabs tablet  Commonly known as:  ELIQUIS  Take 1 tablet (5 mg total) by mouth 2 (two) times daily.  Start  taking on:  05/15/2015     atenolol 25 MG tablet  Commonly known as:  TENORMIN  Take one tablet by mouth once daily for blood pressure     cholecalciferol 1000 UNITS tablet  Commonly known as:  VITAMIN D  Take 3,000 Units by mouth daily.     denosumab 60 MG/ML Soln injection  Commonly known as:  PROLIA  Inject 60 mg into the skin every 6 (six) months. Administer in upper arm, thigh, or abdomen     irbesartan 300 MG tablet  Commonly known as:  AVAPRO  One daily to control BP     levothyroxine 112 MCG tablet  Commonly known as:  SYNTHROID, LEVOTHROID  Take one tablet by mouth once daily for thyroid supplement     lidocaine 5 %  Commonly known as:  LIDODERM  Place 1-3 patches onto the skin daily. Apply in AM and remove in PM. Remove & Discard patch within 12 hours or as directed by MD     omeprazole 20 MG capsule  Commonly known as:  PRILOSEC  Take 1 capsule (20 mg total) by mouth daily.     oxyCODONE-acetaminophen 5-325 MG tablet  Commonly known as:  PERCOCET/ROXICET  Take 1 tablet by mouth every 6 (six) hours as needed. Take one tablet at 8 AM, 1 PM, 6 PM, and 10 PM. May have additional tablet between the hours of 1 AM and 7 AM if she wakens with pain.     prochlorperazine 10 MG tablet  Commonly known as:  COMPAZINE  Take 1 tablet (10 mg total) by mouth every 6 (six) hours as needed for nausea or vomiting.     VITAMIN B-12 CR PO  Take 1 tablet by mouth daily.     zolpidem 10 MG tablet  Commonly known as:  AMBIEN  Take one tablet by mouth by mouth at bedtime for sleep        Major procedures and Radiology Reports - PLEASE review detailed and final reports for all details, in brief -    TTE  - Left ventricle: The cavity size was normal. Wall thickness was normal. Systolic function was normal. The estimated ejection fraction was in the range of 60% to 65%. Wall motion was normal; there were no regional wall motion abnormalities. Doppler parameters are  consistent with abnormal left ventricular relaxation (grade 1 diastolic dysfunction). - Right atrium: The atrium was mildly dilated.   Leg [place]  PRELIMINARY    Preliminary report: Bilateral lower extremity venous duplex completed. Bilateral: No evidence of DVT, superficial thrombosis, or Baker's Cyst   Dg Chest 2 View  05/07/2015  CLINICAL DATA:  Chest pain with headache and shortness of breath for 2 days. EXAM: CHEST  2 VIEW COMPARISON:  None. FINDINGS: Mild cardiomegaly. Tortuosity of the thoracic aorta. No confluent airspace opacities or effusions. No acute bony abnormality. Multiple thoracic compression fractures with multilevel vertebroplasty changes. IMPRESSION: Cardiomegaly.  No active disease. Electronically Signed   By: Rolm Baptise M.D.   On: 05/07/2015 12:40   Ct Angio Chest Pe W/cm &/or Wo Cm  05/07/2015  CLINICAL DATA:  Chest pain and shortness of breath, history of recent hip replacement approximately 1 month ago EXAM: CT ANGIOGRAPHY CHEST WITH CONTRAST TECHNIQUE: Multidetector CT imaging of the chest was  performed using the standard protocol during bolus administration of intravenous contrast. Multiplanar CT image reconstructions and MIPs were obtained to evaluate the vascular anatomy. CONTRAST:  1110mL OMNIPAQUE IOHEXOL 350 MG/ML SOLN COMPARISON:  None. No focal infiltrate is noted. FINDINGS: The lungs are well aerated and demonstrates some dependent atelectatic changes. No focal confluent infiltrate or sizable nodule is noted. No effusion or pneumothorax is seen. The thoracic inlet is within normal limits. Limited opacification of the aorta is seen. Mild calcifications are noted without definitive aneurysmal dilatation. The pulmonary artery demonstrates a normal branching pattern. There are changes consistent with right lower lobe pulmonary emboli. The cardiac structures show no definitive right heart strain. Visualized upper abdomen is within normal limits. The osseous  structures show changes of prior vertebral augmentation as well as other non treated compression deformities. Review of the MIP images confirms the above findings. IMPRESSION: Right lower lobe pulmonary emboli without significant right heart strain. Changes of multiple thoracic compression deformities with evidence of prior vertebral augmentation. Electronically Signed   By: Inez Catalina M.D.   On: 05/07/2015 15:29    Micro Results      No results found for this or any previous visit (from the past 240 hour(s)).     Today   Subjective    Bailey [name] today has no headache,no chest abdominal pain,no new weakness tingling or numbness, feels much better .   Objective   Blood pressure 178/81, pulse 60, temperature 98.4 F (36.9 C), temperature source Oral, resp. rate 18, height 5\' 5"  (1.651 m), weight 61.236 kg (135 lb), SpO2 99 %.   Intake/Output Summary (Last 24 hours) at 05/09/15 1000 Last data filed at 05/09/15 0947  Gross per 24 hour  Intake 2238.33 ml  Output   1050 ml  Net 1188.33 ml    Exam Awake Alert, Oriented x 3, No new F.N deficits, Normal affect [place].AT,PERRAL Supple Neck,No JVD, No cervical lymphadenopathy appriciated.  Symmetrical Chest wall movement, Good air movement bilaterally, CTAB RRR,No Gallops,Rubs or new Murmurs, No Parasternal Heave +ve B.Sounds, Abd Soft, Non tender, No organomegaly appriciated, No rebound -guarding or rigidity. No Cyanosis, Clubbing or edema, No new Rash or bruise   Data Review   CBC w Diff: Lab Results  Component Value Date   WBC 5.2 05/09/2015   WBC 4.3 04/20/2015   HGB 12.2 05/09/2015   HCT 35.7* 05/09/2015   PLT 175 05/09/2015   LYMPHOPCT 12 03/18/2015   MONOPCT 5 03/18/2015   EOSPCT 1 03/18/2015   BASOPCT 0 03/18/2015    CMP: Lab Results  Component Value Date   NA 136 05/08/2015   NA 135* 04/20/2015   K 4.0 05/08/2015   CL 107 05/08/2015   CO2 21* 05/08/2015   BUN 20 05/08/2015   BUN 23* 04/20/2015    CREATININE 1.04* 05/08/2015   CREATININE 1.1 04/20/2015   GLU 96 03/26/2015   ALKPHOS 95 04/10/2015   AST 14 04/10/2015   ALT 8 04/10/2015  .   Total Time in preparing paper work, data evaluation and todays exam - 35 minutes  Thurnell Lose M.D on 05/09/2015 at 10:00 AM  [facility]   Office  [phone]

## 2015-05-12 ENCOUNTER — Non-Acute Institutional Stay (SKILLED_NURSING_FACILITY): Payer: Medicare Other | Admitting: Internal Medicine

## 2015-05-12 ENCOUNTER — Encounter: Payer: Self-pay | Admitting: Internal Medicine

## 2015-05-12 VITALS — BP 126/80 | HR 91 | Temp 98.5°F | Resp 20 | Ht 65.0 in | Wt 123.8 lb

## 2015-05-12 DIAGNOSIS — G894 Chronic pain syndrome: Secondary | ICD-10-CM

## 2015-05-12 DIAGNOSIS — S32591S Other specified fracture of right pubis, sequela: Secondary | ICD-10-CM

## 2015-05-12 DIAGNOSIS — I10 Essential (primary) hypertension: Secondary | ICD-10-CM | POA: Diagnosis not present

## 2015-05-12 DIAGNOSIS — I2609 Other pulmonary embolism with acute cor pulmonale: Secondary | ICD-10-CM | POA: Diagnosis not present

## 2015-05-12 DIAGNOSIS — N182 Chronic kidney disease, stage 2 (mild): Secondary | ICD-10-CM | POA: Diagnosis not present

## 2015-05-12 DIAGNOSIS — S72141A Displaced intertrochanteric fracture of right femur, initial encounter for closed fracture: Secondary | ICD-10-CM | POA: Diagnosis not present

## 2015-05-12 DIAGNOSIS — S32501S Unspecified fracture of right pubis, sequela: Secondary | ICD-10-CM | POA: Diagnosis not present

## 2015-05-12 DIAGNOSIS — N390 Urinary tract infection, site not specified: Secondary | ICD-10-CM | POA: Diagnosis not present

## 2015-05-12 DIAGNOSIS — D649 Anemia, unspecified: Secondary | ICD-10-CM

## 2015-05-12 MED ORDER — OXYCODONE-ACETAMINOPHEN 7.5-325 MG PO TABS: ORAL_TABLET | ORAL | Status: DC

## 2015-05-12 NOTE — Progress Notes (Signed)
Patient ID: Bailey [name], female   DOB: 03-30-1927, 81 y.o.   MRN: [id number]    HISTORY AND PHYSICAL  Location:  [facility] Room Number: ALF-35 Place of Service: ALF (13)   Extended Emergency Contact Information Primary Emergency Contact: Santoli,[name] Address: [address] [place]          [place], [place] [address] [name] of [place] Phone: [phone] Mobile Phone: [phone] Relation: Spouse Secondary Emergency Contact: Oravec,[name] Address: [address]          [place], [place] [address] [name] of [place] Mobile Phone: [phone] Relation: Son  Advanced Directive information Does patient have an advance directive?: Yes, Type of Advance Directive: Healthcare Power of [place];Living will;Out of facility DNR (pink MOST or yellow form), Pre-existing out of facility DNR order (yellow form or pink MOST form): Yellow form placed in chart (order not valid for inpatient use), Does patient want to make changes to advanced directive?: No - Patient declined  Chief Complaint  Patient presents with  . Hospitalization Follow-up    DVT, labs  . Readmit To SNF    HPI:  Hospitalized 05/07/2015 through 05/09/2015. Patient was sent to the hospital from her assisted facility after sustaining chest pain and shortness of breath. An acute pulmonary embolus was found. Anticoagulation was started and she was returned to the assisted living. Eliquis will be her sustaining anticoagulant.  Patient was in the assisted living area as a follow-up from a recent admission to SNF following a fall with fracture of the pubic area and the right femur.   She was readmitted 05/09/15 for further rehabilitative care.  She complains of continued problems with pain management. Prior to going to the hospital 05/07/2015, we have planned to increase her oxycodone/APAP 7.5/325 mg on a regularly scheduled basis 4 times daily with the next one in the middle of the night  if she needed it for pain control. The pain medications were never increased as had been planned.  Follow-up CBC and BMP as an outpatient has been requested by the hospitalist.  CKD (chronic kidney disease) stage 2, GFR 60-89 ml/min - mild elevations in BUN and creatinine  Chronic pain syndrome - related to severe osteoarthritis and multiple fractures  Anemia, unspecified anemia type - last hemoglobin normal at 12.2 g percent  Essential hypertension - adequately controlled  Fracture of multiple pubic rami, right, sequela - still painful with attempts to walk  Fracture, intertrochanteric, right femur, closed, initial encounter ([place]) - continues with pain with attempts to walk     Past Medical History  Diagnosis Date  . Osteoarthrosis, unspecified whether generalized or localized, forearm     bilaterally wrist  . Allergic rhinitis, cause unspecified   . Senile osteoporosis   . Malignant neoplasm of breast (female), unspecified site 1990    left. Had surgerey and chemo, but no radiation  . Acquired cyst of kidney     left  . Left cavernous carotid aneurysm 08/2008    1-1/2m aneurysm of cavernous carotid artery  . Anemia, unspecified   . Unspecified gastritis and gastroduodenitis without mention of hemorrhage   . 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 Other and unspecified nonspecific immunological findings     positive ANA  . History of Epstein-Barr virus infection   . Herpes simplex without mention of complication   . Insomnia, unspecified   . Unspecified hypothyroidism   .  Hyposmolality and/or hyponatremia   . Unspecified vitamin D deficiency   . Pure hypercholesterolemia   . Other B-complex deficiencies   . Osteoarthrosis of knee     bilaterally  . Closed fracture of unspecified part of vertebral column without mention of spinal cord injury     T7 s/p kyphoplasty, T12  . Unspecified essential hypertension   . Pain in  joint, site unspecified     severe, diffuse, chronic pain  . Osteoarthrosis, hip   . Edema 2015  . CKD stage 2 due to type 2 diabetes mellitus ([place] [facility]) 11/18/2014  . Neuropathy ([place])   . Positive ANA (antinuclear antibody)   . Insomnia   . Pulmonary embolism ([place]) 05/07/2015    Past Surgical History  Procedure Laterality Date  . Dilation and curettage of uterus  X 2  . Middle ear surgery Bilateral 1938  . Mastectomy Left 04/29/1989  . Tonsillectomy  1968  . Nasal sinus surgery  1990 & 2000  . Laparoscopic cholecystectomy  09/20/2006  . Orif hip fracture Left 06/13/2007    following a syncopal episode  . Kyphoplasty  07/03/2010    T12  . Kyphoplasty      C7  . Femur im nail Right 03/18/2015    Procedure: INTRAMEDULLARY (IM) NAIL FEMORAL;  Surgeon: Rod Can, MD;  Location: [facility];  Service: Orthopedics;  Laterality: Right;  . Fracture surgery      Patient Care Team: Estill Dooms, MD as PCP - General (Internal Medicine) Man Mast X, NP as Nurse Practitioner (Nurse Practitioner)  Social History   Social History  . Marital Status: Married    Spouse Name: N/A  . Number of Children: N/A  . Years of Education: N/A   Occupational History  . retired Marine scientist    Social History Main Topics  . Smoking status: Never Smoker   . Smokeless tobacco: Never Used  . Alcohol Use: Yes     Comment: 05/08/2015 "I'll have a glass of white wine q now and then"  . Drug Use: No  . Sexual Activity: No   Other Topics Concern  . Not on file   Social History Narrative   L[facility] at [facility] since 10/16/2013, moved from [place]    Married 1968   Never smoked   No alcohol    Exercise walking, walks with cane   POA/Living Will                reports that she has never smoked. She has never used smokeless tobacco. She reports that she drinks alcohol. She reports that she does not use illicit drugs.  No family history on file. Family Status  Relation Status Death Age  . Mother Deceased 22     old age  . Father Deceased 74    war casualty  . Sister Deceased   . Brother Deceased   . Son Alive     Immunization History  Administered Date(s) Administered  . Influenza Split 02/13/2013  . Influenza-Unspecified 02/27/2014, 02/12/2015  . Pneumococcal Polysaccharide-23 02/13/2013    Allergies  Allergen Reactions  . Aspirin     Drop in body temp with large quantities, can tolerate low doses of aspirin  . Penicillins Swelling    Has patient had a PCN reaction causing immediate rash, facial/tongue/throat swelling, SOB or lightheadedness with hypotension: No Has patient had a PCN reaction causing severe rash involving mucus membranes or skin necrosis: No Has patient had a PCN reaction that required hospitalization No  Has patient had a PCN reaction occurring within the last 10 years: No If all of the above answers are "NO", then may proceed with Cephalosporin use.    Medications: Patient's Medications  New Prescriptions   No medications on file  Previous Medications   AMLODIPINE (NORVASC) 5 MG TABLET    One daly to control BP   APIXABAN (ELIQUIS) 5 MG TABS TABLET    Take 1 tablet (5 mg total) by mouth 2 (two) times daily.   ATENOLOL (TENORMIN) 25 MG TABLET    Take one tablet by mouth once daily for blood pressure   CHOLECALCIFEROL (VITAMIN D) 1000 UNITS TABLET    Take 3,000 Units by mouth daily.   CYANOCOBALAMIN (VITAMIN B-12 CR PO)    Take 1 tablet by mouth daily.    DENOSUMAB (PROLIA) 60 MG/ML SOLN INJECTION    Inject 60 mg into the skin every 6 (six) months. Administer in upper arm, thigh, or abdomen   IRBESARTAN (AVAPRO) 300 MG TABLET    One daily to control BP   LEVOTHYROXINE (SYNTHROID, LEVOTHROID) 112 MCG TABLET    Take one tablet by mouth once daily for thyroid supplement   LIDOCAINE (LIDODERM) 5 %    Place 1-3 patches onto the skin daily. Apply in AM and remove in PM. Remove & Discard patch within 12 hours or as directed by MD   OMEPRAZOLE (PRILOSEC) 20 MG CAPSULE     Take 1 capsule (20 mg total) by mouth daily.   OXYCODONE-ACETAMINOPHEN (PERCOCET/ROXICET) 5-325 MG TABLET    Take 1 tablet by mouth every 6 (six) hours as needed. Take one tablet at 8 AM, 1 PM, 6 PM, and 10 PM. May have additional tablet between the hours of 1 AM and 7 AM if she wakens with pain.   PROCHLORPERAZINE (COMPAZINE) 10 MG TABLET    Take 1 tablet (10 mg total) by mouth every 6 (six) hours as needed for nausea or vomiting.   ZOLPIDEM (AMBIEN) 10 MG TABLET    Take one tablet by mouth by mouth at bedtime for sleep  Modified Medications   No medications on file  Discontinued Medications   APIXABAN (ELIQUIS) 5 MG TABS TABLET    Take 2 tablets (10 mg total) by mouth 2 (two) times daily.    Review of Systems  Constitutional: Negative for fever, chills and diaphoresis.  HENT: Negative for congestion, ear discharge, ear pain, hearing loss, nosebleeds and tinnitus.        Sinus allergies  Eyes: Negative for photophobia and pain.  Respiratory: Positive for chest tightness and shortness of breath. Negative for cough and wheezing.        History pulmonary embolism 05/07/2015.  Cardiovascular: Positive for chest pain (Interscapular) and leg swelling. Negative for palpitations.  Gastrointestinal: Positive for constipation. Negative for nausea, vomiting and abdominal pain.       Flatulence. Hemorrhoids.  Genitourinary: Negative.  Negative for dysuria, urgency and frequency.  Musculoskeletal: Positive for back pain and gait problem (using walker).       History of osteoporosis. Pain in the left knee and both hips. C/o right leg muscle spasm on and off  Skin: Negative for rash.       Right hip surgical incision healed.  Neurological: Negative for dizziness, tremors and headaches.  Psychiatric/Behavioral: Negative for suicidal ideas and hallucinations. The patient is not nervous/anxious.     Filed Vitals:   05/12/15 1127  BP: 126/80  Pulse: 91  Temp: 98.5 F (36.9 C)  TempSrc: Oral    Resp: 20  Height: 5' 5" (1.651 m)  Weight: 123 lb 12.8 oz (56.155 kg)  SpO2: 91%   Body mass index is 20.6 kg/(m^2).  Physical Exam  Constitutional: She is oriented to person, place, and time. No distress.  Elderly. Frail. Slow in movement due to back pain.  HENT:  Right Ear: External ear normal.  Left Ear: External ear normal.  Nose: Nose normal.  Mouth/Throat: Oropharynx is clear and moist. No oropharyngeal exudate.  Eyes: Conjunctivae and EOM are normal. Pupils are equal, round, and reactive to light.  Neck: No JVD present. No tracheal deviation present. No thyromegaly present.  Cardiovascular: Normal rate, regular rhythm, normal heart sounds and intact distal pulses.  Exam reveals no gallop and no friction rub.   No murmur heard. Pulmonary/Chest: No respiratory distress. She has no wheezes. She has no rales. She exhibits no tenderness.  Tachypneic  Abdominal: She exhibits no distension and no mass. There is no tenderness.  Musculoskeletal: Normal range of motion. She exhibits edema and tenderness (Lumbar area and left knee).  Right hip pain with movement, very trace edema R leg  Lymphadenopathy:    She has no cervical adenopathy.  Neurological: She is alert and oriented to person, place, and time. She has normal reflexes.  10/22/2013 MMSE 30/30. Passed clock drawing  Skin: No rash noted. No erythema. No pallor.  Right hip surgical incision healed.  Psychiatric: She has a normal mood and affect. Her behavior is normal. Judgment and thought content normal.    Labs reviewed: Lab Summary Latest Ref Rng 05/09/2015 05/08/2015 05/07/2015 04/20/2015 04/10/2015  Hemoglobin 12.0 - 15.0 g/dL 12.2 11.2(L) 12.2 10.5(A) (None)  Hematocrit 36.0 - 46.0 % 35.7(L) 33.1(L) 35.5(L) 31(A) (None)  White count 4.0 - 10.5 K/uL 5.2 4.8 7.7 4.3 (None)  Platelet count 150 - 400 K/uL 175 145(L) 175 158 (None)  Sodium 135 - 145 mmol/L (None) 136 132(L) 135(A) (None)  Potassium 3.5 - 5.1 mmol/L (None)  4.0 4.7 4.4 (None)  Calcium 8.9 - 10.3 mg/dL (None) 7.9(L) 8.8(L) (None) (None)  Phosphorus - (None) (None) (None) (None) (None)  Creatinine 0.44 - 1.00 mg/dL (None) 1.04(H) 1.05(H) 1.1 (None)  AST 13 - 35 U/L (None) (None) (None) (None) 14  Alk Phos 25 - 125 U/L (None) (None) (None) (None) 95  Bilirubin - (None) (None) (None) (None) (None)  Glucose 65 - 99 mg/dL (None) 91 106(H) (None) (None)  Cholesterol - (None) (None) (None) (None) (None)  HDL cholesterol - (None) (None) (None) (None) (None)  Triglycerides - (None) (None) (None) (None) (None)  LDL Direct - (None) (None) (None) (None) (None)  LDL Calc - (None) (None) (None) (None) (None)  Total protein - (None) (None) (None) (None) (None)  Albumin - (None) (None) (None) (None) (None)   Lab Results  Component Value Date   BUN 20 05/08/2015   No results found for: HGBA1C Lab Results  Component Value Date   TSH 1.137 05/07/2015          Dg Chest 2 View  05/07/2015  CLINICAL DATA:  Chest pain with headache and shortness of breath for 2 days. EXAM: CHEST  2 VIEW COMPARISON:  None. FINDINGS: Mild cardiomegaly. Tortuosity of the thoracic aorta. No confluent airspace opacities or effusions. No acute bony abnormality. Multiple thoracic compression fractures with multilevel vertebroplasty changes. IMPRESSION: Cardiomegaly.  No active disease. Electronically Signed   By: Rolm Baptise M.D.   On: 05/07/2015 12:40   Ct Angio Chest Pe  W/cm &/or Wo Cm  05/07/2015  CLINICAL DATA:  Chest pain and shortness of breath, history of recent hip replacement approximately 1 month ago EXAM: CT ANGIOGRAPHY CHEST WITH CONTRAST TECHNIQUE: Multidetector CT imaging of the chest was performed using the standard protocol during bolus administration of intravenous contrast. Multiplanar CT image reconstructions and MIPs were obtained to evaluate the vascular anatomy. CONTRAST:  134m OMNIPAQUE IOHEXOL 350 MG/ML SOLN COMPARISON:  None. No focal infiltrate is  noted. FINDINGS: The lungs are well aerated and demonstrates some dependent atelectatic changes. No focal confluent infiltrate or sizable nodule is noted. No effusion or pneumothorax is seen. The thoracic inlet is within normal limits. Limited opacification of the aorta is seen. Mild calcifications are noted without definitive aneurysmal dilatation. The pulmonary artery demonstrates a normal branching pattern. There are changes consistent with right lower lobe pulmonary emboli. The cardiac structures show no definitive right heart strain. Visualized upper abdomen is within normal limits. The osseous structures show changes of prior vertebral augmentation as well as other non treated compression deformities. Review of the MIP images confirms the above findings. IMPRESSION: Right lower lobe pulmonary emboli without significant right heart strain. Changes of multiple thoracic compression deformities with evidence of prior vertebral augmentation. Electronically Signed   By: MInez CatalinaM.D.   On: 05/07/2015 15:29     Assessment/Plan  1. Other acute pulmonary embolism with acute cor pulmonale (HCC)  - Continue anticoagulation with Eliquis BMP, future  2. CKD (chronic kidney disease) stage 2, GFR 60-89 ml/min  - CMP, future  3. Chronic pain syndrome Increase oxycodone/APAP 7.5/325 at 10 AM, 1 PM, 6 PM, and 10 PM.  4. Anemia, unspecified anemia type - CBC, future  5. Essential hypertension Controlled  6. Fracture of multiple pubic rami, right, sequela Continue physical therapy and OT Increase strength of oxycodone/APAP to assist with pain control  7. Fracture, intertrochanteric, right femur, closed, initial encounter ([place] Continue PT and OT

## 2015-05-12 NOTE — Progress Notes (Deleted)
Patient ID: Bailey [name], female   DOB: 1927/04/28, 81 y.o.   MRN: [id number]    [facility] Room Number: ALF-35  Place of Service: ALF (13)     Allergies  Allergen Reactions  . Aspirin     Drop in body temp with large quantities, can tolerate low doses of aspirin  . Penicillins Swelling    Has patient had a PCN reaction causing immediate rash, facial/tongue/throat swelling, SOB or lightheadedness with hypotension: {Yes/No:30480221} Has patient had a PCN reaction causing severe rash involving mucus membranes or skin necrosis: {Yes/No:30480221} Has patient had a PCN reaction that required hospitalization {Yes/No:30480221} Has patient had a PCN reaction occurring within the last 10 years: {Yes/No:30480221} If all of the above answers are "NO", then may proceed with Cephalosporin use.    Chief Complaint  Patient presents with  . Hospitalization Follow-up    DVT, labs    HPI:  ***  Medications: Patient's Medications  New Prescriptions   No medications on file  Previous Medications   AMLODIPINE (NORVASC) 5 MG TABLET    One daly to control BP   APIXABAN (ELIQUIS) 5 MG TABS TABLET    Take 1 tablet (5 mg total) by mouth 2 (two) times daily.   ATENOLOL (TENORMIN) 25 MG TABLET    Take one tablet by mouth once daily for blood pressure   CHOLECALCIFEROL (VITAMIN D) 1000 UNITS TABLET    Take 3,000 Units by mouth daily.   CYANOCOBALAMIN (VITAMIN B-12 CR PO)    Take 1 tablet by mouth daily.    DENOSUMAB (PROLIA) 60 MG/ML SOLN INJECTION    Inject 60 mg into the skin every 6 (six) months. Administer in upper arm, thigh, or abdomen   IRBESARTAN (AVAPRO) 300 MG TABLET    One daily to control BP   LEVOTHYROXINE (SYNTHROID, LEVOTHROID) 112 MCG TABLET    Take one tablet by mouth once daily for thyroid supplement   LIDOCAINE (LIDODERM) 5 %    Place 1-3 patches onto the skin daily. Apply in AM and remove in PM. Remove & Discard patch within 12 hours or as directed by MD     OMEPRAZOLE (PRILOSEC) 20 MG CAPSULE    Take 1 capsule (20 mg total) by mouth daily.   OXYCODONE-ACETAMINOPHEN (PERCOCET/ROXICET) 5-325 MG TABLET    Take 1 tablet by mouth every 6 (six) hours as needed. Take one tablet at 8 AM, 1 PM, 6 PM, and 10 PM. May have additional tablet between the hours of 1 AM and 7 AM if she wakens with pain.   PROCHLORPERAZINE (COMPAZINE) 10 MG TABLET    Take 1 tablet (10 mg total) by mouth every 6 (six) hours as needed for nausea or vomiting.   ZOLPIDEM (AMBIEN) 10 MG TABLET    Take one tablet by mouth by mouth at bedtime for sleep  Modified Medications   No medications on file  Discontinued Medications   APIXABAN (ELIQUIS) 5 MG TABS TABLET    Take 2 tablets (10 mg total) by mouth 2 (two) times daily.     Review of Systems  Filed Vitals:   05/12/15 1127  BP: 126/80  Pulse: 91  Temp: 98.5 F (36.9 C)  TempSrc: Oral  Resp: 20  Height: '5\' 5"'  (1.651 m)  Weight: 123 lb 12.8 oz (56.155 kg)  SpO2: 91%   Wt Readings from Last 3 Encounters:  05/12/15 123 lb 12.8 oz (56.155 kg)  05/08/15 135 lb (61.236 kg)  04/16/15  135 lb (61.236 kg)    Body mass index is 20.6 kg/(m^2).  Physical Exam   Labs reviewed: Lab Summary Latest Ref Rng 05/09/2015 05/08/2015 05/07/2015 04/20/2015 04/10/2015  Hemoglobin 12.0 - 15.0 g/dL 12.2 11.2(L) 12.2 10.5(A) (None)  Hematocrit 36.0 - 46.0 % 35.7(L) 33.1(L) 35.5(L) 31(A) (None)  White count 4.0 - 10.5 K/uL 5.2 4.8 7.7 4.3 (None)  Platelet count 150 - 400 K/uL 175 145(L) 175 158 (None)  Sodium 135 - 145 mmol/L (None) 136 132(L) 135(A) (None)  Potassium 3.5 - 5.1 mmol/L (None) 4.0 4.7 4.4 (None)  Calcium 8.9 - 10.3 mg/dL (None) 7.9(L) 8.8(L) (None) (None)  Phosphorus - (None) (None) (None) (None) (None)  Creatinine 0.44 - 1.00 mg/dL (None) 1.04(H) 1.05(H) 1.1 (None)  AST 13 - 35 U/L (None) (None) (None) (None) 14  Alk Phos 25 - 125 U/L (None) (None) (None) (None) 95  Bilirubin - (None) (None) (None) (None) (None)  Glucose  65 - 99 mg/dL (None) 91 106(H) (None) (None)  Cholesterol - (None) (None) (None) (None) (None)  HDL cholesterol - (None) (None) (None) (None) (None)  Triglycerides - (None) (None) (None) (None) (None)  LDL Direct - (None) (None) (None) (None) (None)  LDL Calc - (None) (None) (None) (None) (None)  Total protein - (None) (None) (None) (None) (None)  Albumin - (None) (None) (None) (None) (None)   Lab Results  Component Value Date   TSH 1.137 05/07/2015   Lab Results  Component Value Date   BUN 20 05/08/2015   No results found for: HGBA1C     Assessment/Plan

## 2015-05-14 ENCOUNTER — Non-Acute Institutional Stay: Payer: Medicare Other | Admitting: Internal Medicine

## 2015-05-14 ENCOUNTER — Encounter: Payer: Self-pay | Admitting: Internal Medicine

## 2015-05-14 DIAGNOSIS — G894 Chronic pain syndrome: Secondary | ICD-10-CM

## 2015-05-14 DIAGNOSIS — I10 Essential (primary) hypertension: Secondary | ICD-10-CM

## 2015-05-14 DIAGNOSIS — E7800 Pure hypercholesterolemia, unspecified: Secondary | ICD-10-CM | POA: Diagnosis not present

## 2015-05-14 DIAGNOSIS — N182 Chronic kidney disease, stage 2 (mild): Secondary | ICD-10-CM

## 2015-05-14 DIAGNOSIS — N390 Urinary tract infection, site not specified: Secondary | ICD-10-CM | POA: Diagnosis not present

## 2015-05-14 DIAGNOSIS — D62 Acute posthemorrhagic anemia: Secondary | ICD-10-CM

## 2015-05-14 DIAGNOSIS — E039 Hypothyroidism, unspecified: Secondary | ICD-10-CM | POA: Diagnosis not present

## 2015-05-14 LAB — BASIC METABOLIC PANEL
BUN: 24 mg/dL — AB (ref 4–21)
Creatinine: 1 mg/dL (ref 0.5–1.1)
Glucose: 97 mg/dL
POTASSIUM: 4.3 mmol/L (ref 3.4–5.3)
SODIUM: 128 mmol/L — AB (ref 137–147)

## 2015-05-14 LAB — HEPATIC FUNCTION PANEL
ALK PHOS: 62 U/L (ref 25–125)
ALT: 8 U/L (ref 7–35)
AST: 12 U/L — AB (ref 13–35)
BILIRUBIN, TOTAL: 0.8 mg/dL

## 2015-05-14 LAB — CBC AND DIFFERENTIAL
HEMATOCRIT: 38 % (ref 36–46)
HEMOGLOBIN: 12.9 g/dL (ref 12.0–16.0)
Platelets: 181 10*3/uL (ref 150–399)
WBC: 6.1 10^3/mL

## 2015-05-14 MED ORDER — OXYCODONE-ACETAMINOPHEN 7.5-325 MG PO TABS: ORAL_TABLET | ORAL | Status: DC

## 2015-05-14 MED ORDER — LIDOCAINE 4 % EX PTCH: 3.0000 | MEDICATED_PATCH | Freq: Once | CUTANEOUS | Status: DC

## 2015-05-14 NOTE — Progress Notes (Signed)
Patient ID: Bailey [name], female   DOB: 11-16-1926, 81 y.o.   MRN: [id number]    [facility] Room Number: [facility] 35  Place of Service: [facility] (13)     Allergies  Allergen Reactions  . Aspirin     Drop in body temp with large quantities, can tolerate low doses of aspirin  . Penicillins Swelling    Has patient had a PCN reaction causing immediate rash, facial/tongue/throat swelling, SOB or lightheadedness with hypotension: No Has patient had a PCN reaction causing severe rash involving mucus membranes or skin necrosis: No Has patient had a PCN reaction that required hospitalization No Has patient had a PCN reaction occurring within the last 10 years: No If all of the above answers are "NO", then may proceed with Cephalosporin use.    Chief Complaint  Patient presents with  . Acute Visit    Pain control    HPI:  I was asked to see patient acutely by staff to evaluate pain control. Oxycodone was increased from 5 mg to 7-1/2 mg a scheduled basis 2 days ago. The when necessary dosing in the middle of the night was, unfortunately, discontinued. Patient awakened last night with severe pain. She is requesting that oxycodone be restored for a middle of the night dosing if she needs it.  Daytime pain control does seem to be better on oxycodone 7.5/APAP 325.  Lab is returned showing a normal CBC with hemoglobin 12.9. CMP showed a low sodium at 128. BUN was 24 and creatinine 1.05. Creatinine has improved from previous value of 1.43 on 03/26/2015. Calcium has increased from 7.7285.  Patient had a follow-up urine culture done 05/12/2015 and has returned as normal. There was no significant bacterial growth.  Patient's pharmacy has refused to cover the lidocaine 5% patch, but suggests the OTC Aspercreme with 4% lidocaine. Patient is willing to try the substitution in order to achieve cost savings.  Medications: Patient's Medications  New Prescriptions   No medications on  file  Previous Medications   AMLODIPINE (NORVASC) 5 MG TABLET    One daly to control BP   APIXABAN (ELIQUIS) 5 MG TABS TABLET    Take 1 tablet (5 mg total) by mouth 2 (two) times daily.   ATENOLOL (TENORMIN) 25 MG TABLET    Take one tablet by mouth once daily for blood pressure   CHOLECALCIFEROL (VITAMIN D) 1000 UNITS TABLET    Take 3,000 Units by mouth daily.   CYANOCOBALAMIN (VITAMIN B-12 CR PO)    Take 1 tablet by mouth daily.    DENOSUMAB (PROLIA) 60 MG/ML SOLN INJECTION    Inject 60 mg into the skin every 6 (six) months. Administer in upper arm, thigh, or abdomen   IRBESARTAN (AVAPRO) 300 MG TABLET    One daily to control BP   LEVOTHYROXINE (SYNTHROID, LEVOTHROID) 112 MCG TABLET    Take one tablet by mouth once daily for thyroid supplement   LIDOCAINE (LIDODERM) 5 %    Place 1-3 patches onto the skin daily. Apply in AM and remove in PM. Remove & Discard patch within 12 hours or as directed by MD   OMEPRAZOLE (PRILOSEC) 20 MG CAPSULE    Take 1 capsule (20 mg total) by mouth daily.   OXYCODONE-ACETAMINOPHEN (PERCOCET) 7.5-325 MG TABLET    Take one tablet at 10 AM, 1 PM, 8 PM, and 10 PM for pain control. May take an extra tablet between midnight and 6 AM if needed for pain  control.   PROCHLORPERAZINE (COMPAZINE) 10 MG TABLET    Take 1 tablet (10 mg total) by mouth every 6 (six) hours as needed for nausea or vomiting.   ZOLPIDEM (AMBIEN) 10 MG TABLET    Take one tablet by mouth by mouth at bedtime for sleep  Modified Medications   No medications on file  Discontinued Medications   No medications on file     Review of Systems  Constitutional: Negative for fever, chills and diaphoresis.  HENT: Negative for congestion, ear discharge, ear pain, hearing loss, nosebleeds and tinnitus.        Sinus allergies  Eyes: Negative for photophobia and pain.  Respiratory: Positive for chest tightness and shortness of breath. Negative for cough and wheezing.        History pulmonary embolism 05/07/2015.    Cardiovascular: Positive for chest pain (Interscapular) and leg swelling. Negative for palpitations.  Gastrointestinal: Positive for constipation. Negative for nausea, vomiting and abdominal pain.       Flatulence. Hemorrhoids.  Genitourinary: Negative.  Negative for dysuria, urgency and frequency.  Musculoskeletal: Positive for back pain and gait problem (using walker).       History of osteoporosis. Pain in the left knee and both hips. C/o right leg muscle spasm on and off  Skin: Negative for rash.       Right hip surgical incision healed.  Neurological: Negative for dizziness, tremors and headaches.  Psychiatric/Behavioral: Negative for suicidal ideas and hallucinations. The patient is not nervous/anxious.     Filed Vitals:   05/14/15 1236  BP: 160/88  Pulse: 56  Temp: 98 F (36.7 C)  Resp: 18  Height: '5\' 5"'  (1.651 m)  Weight: 127 lb (57.607 kg)   Wt Readings from Last 3 Encounters:  05/14/15 127 lb (57.607 kg)  05/12/15 123 lb 12.8 oz (56.155 kg)  05/08/15 135 lb (61.236 kg)    Body mass index is 21.13 kg/(m^2).  Physical Exam  Constitutional: She is oriented to person, place, and time. No distress.  Elderly. Frail. Slow in movement due to back pain.  HENT:  Right Ear: External ear normal.  Left Ear: External ear normal.  Nose: Nose normal.  Mouth/Throat: Oropharynx is clear and moist. No oropharyngeal exudate.  Eyes: Conjunctivae and EOM are normal. Pupils are equal, round, and reactive to light.  Neck: No JVD present. No tracheal deviation present. No thyromegaly present.  Cardiovascular: Normal rate, regular rhythm, normal heart sounds and intact distal pulses.  Exam reveals no gallop and no friction rub.   No murmur heard. Pulmonary/Chest: No respiratory distress. She has no wheezes. She has no rales. She exhibits no tenderness.  Tachypneic  Abdominal: She exhibits no distension and no mass. There is no tenderness.  Musculoskeletal: Normal range of motion. She  exhibits edema and tenderness (Lumbar area and left knee).  Right hip pain with movement, very trace edema R leg  Lymphadenopathy:    She has no cervical adenopathy.  Neurological: She is alert and oriented to person, place, and time. She has normal reflexes.  10/22/2013 MMSE 30/30. Passed clock drawing  Skin: No rash noted. No erythema. No pallor.  Right hip surgical incision healed.  Psychiatric: She has a normal mood and affect. Her behavior is normal. Judgment and thought content normal.     Labs reviewed: Lab Summary Latest Ref Rng 05/14/2015 05/09/2015 05/08/2015 05/07/2015  Hemoglobin 12.0 - 16.0 g/dL 12.9 12.2 11.2(L) 12.2  Hematocrit 36 - 46 % 38 35.7(L) 33.1(L) 35.5(L)  Dema Severin  count - 6.1 5.2 4.8 7.7  Platelet count 150 - 399 K/L 181 175 145(L) 175  Sodium 137 - 147 mmol/L 128(A) (None) 136 132(L)  Potassium 3.4 - 5.3 mmol/L 4.3 (None) 4.0 4.7  Calcium 8.9 - 10.3 mg/dL (None) (None) 7.9(L) 8.8(L)  Phosphorus - (None) (None) (None) (None)  Creatinine 0.5 - 1.1 mg/dL 1.0 (None) 1.04(H) 1.05(H)  AST 13 - 35 U/L 12(A) (None) (None) (None)  Alk Phos 25 - 125 U/L 62 (None) (None) (None)  Bilirubin - (None) (None) (None) (None)  Glucose - 97 (None) 91 106(H)  Cholesterol - (None) (None) (None) (None)  HDL cholesterol - (None) (None) (None) (None)  Triglycerides - (None) (None) (None) (None)  LDL Direct - (None) (None) (None) (None)  LDL Calc - (None) (None) (None) (None)  Total protein - (None) (None) (None) (None)  Albumin - (None) (None) (None) (None)   Lab Results  Component Value Date   TSH 1.137 05/07/2015   Lab Results  Component Value Date   BUN 24* 05/14/2015      Assessment/Plan  1. Chronic pain syndrome - Lidocaine (ASPERCREME LIDOCAINE) 4 % PTCH; Apply 3 patches topically once. Apply 1 patch to the left hip, left knee, and the right shoulder in the morning. Remove in the evening.  Dispense: 90 patch; Refill: 5 - oxyCODONE-acetaminophen (PERCOCET)  7.5-325 MG tablet; Take one tablet at 10 AM, 1 PM, 8 PM, and 10 PM for pain control. May take an extra tablet between midnight and 6 AM if needed for pain control.  Dispense: 120 tablet; Refill: 0  2. Essential hypertension Patient continues to have modest elevation in systolic blood pressure. I would like to allow her current medicines to work a little longer without changes in medication. We will recheck this problem in the future.  3. CKD (chronic kidney disease) stage 2, GFR 60-89 ml/min Improved  4. Postoperative anemia due to acute blood loss Resolved  5. Urinary tract infection without hematuria, site unspecified No documented reinfection on last culture.

## 2015-05-26 ENCOUNTER — Encounter: Payer: Self-pay | Admitting: Nurse Practitioner

## 2015-05-26 ENCOUNTER — Non-Acute Institutional Stay: Payer: Medicare Other | Admitting: Nurse Practitioner

## 2015-05-26 DIAGNOSIS — G4700 Insomnia, unspecified: Secondary | ICD-10-CM

## 2015-05-26 DIAGNOSIS — E039 Hypothyroidism, unspecified: Secondary | ICD-10-CM | POA: Diagnosis not present

## 2015-05-26 DIAGNOSIS — G894 Chronic pain syndrome: Secondary | ICD-10-CM

## 2015-05-26 DIAGNOSIS — K219 Gastro-esophageal reflux disease without esophagitis: Secondary | ICD-10-CM

## 2015-05-26 DIAGNOSIS — E871 Hypo-osmolality and hyponatremia: Secondary | ICD-10-CM | POA: Diagnosis not present

## 2015-05-26 DIAGNOSIS — M810 Age-related osteoporosis without current pathological fracture: Secondary | ICD-10-CM

## 2015-05-26 DIAGNOSIS — I10 Essential (primary) hypertension: Secondary | ICD-10-CM

## 2015-05-26 DIAGNOSIS — R040 Epistaxis: Secondary | ICD-10-CM

## 2015-05-26 NOTE — Assessment & Plan Note (Signed)
05/23/15, 05/24/15, episodes x2, will apply ABT ointment to R+L nostrils daily, update CBC.

## 2015-05-26 NOTE — Assessment & Plan Note (Signed)
05/14/15 Na 128 Update BMP

## 2015-05-26 NOTE — Progress Notes (Signed)
Patient ID: Bailey [name], female   DOB: [birth year], 82 y.o.   MRN: [id number]  Location: [facility] Provider:  Marlana Latus NP  Code Status:  DNR Goals of care: Advanced Directive information    Chief Complaint  Patient presents with  . Medical Management of Chronic Issues  . Acute Visit    nose bleed     HPI: Patient is a 82 y.o. female seen in the [facility] at [facility] today for evaluation of nose bleed, 05/23/15 and 05/24/15, stopped by pressure. blood pressure, mildly elevated while on Atenolol 25mg  qd, Amlodipine 5mg  qd, Avapro 300mg  daily. C/o occasional forehead pressure, but not sure if its related to her blood pressure.   UTI, urine culture 03/25/15 Providencia Stuartii, 7 day course of Septra DS started 03/25/15, tolerated.  Post op anemia, Hgb 9.8 03/25/14.   hospital stay 03/18/15-03/23/15 for comminuted right peritrochateric femur fracture/ mildly displaced fracture junction of right superior pubic ramus and acetabulum/ Mildly displaced fracture right inferior pubic ramus. Patient status post supplementary fixation right peritrochaneric femur fracture per Dr. Lyla Glassing 03/19/2015. follow-up with orthjopedics in 2 weeks.   Review of Systems:  Review of Systems  Constitutional: Positive for malaise/fatigue. Negative for fever, chills, weight loss and diaphoresis.  HENT: Negative for congestion, ear discharge, ear pain, hearing loss, nosebleeds and tinnitus.        Sinus allergies  Eyes: Negative for blurred vision, double vision, photophobia and pain.  Respiratory: Positive for shortness of breath (On exertion). Negative for cough and wheezing.   Cardiovascular: Negative for chest pain, palpitations and leg swelling.  Gastrointestinal: Positive for constipation. Negative for heartburn, nausea, vomiting and abdominal pain.       Flatulence. Hemorrhoids.  Genitourinary: Negative.  Negative for dysuria, urgency and frequency.  Musculoskeletal: Positive for back pain and joint pain.   History of osteoporosis. Pain in the left knee and both hips. C/o right leg muscle spasm on and off  Skin: Negative.  Negative for itching and rash.       Right hip surgical incision healing nicely Left upper arm indurated area warmth and mild tenderness at the pneumococcal vac site.   Neurological: Negative for dizziness, tremors, sensory change, speech change, focal weakness and headaches.  Psychiatric/Behavioral: Negative for depression, suicidal ideas, hallucinations, memory loss and substance abuse. The patient has insomnia. The patient is not nervous/anxious.     Past Medical History  Diagnosis Date  . Osteoarthrosis, unspecified whether generalized or localized, forearm     bilaterally wrist  . Allergic rhinitis, cause unspecified   . Senile osteoporosis   . Malignant neoplasm of breast (female), unspecified site 1990    left. Had surgerey and chemo, but no radiation  . Acquired cyst of kidney     left  . Left cavernous carotid aneurysm 08/2008    1-1/9mm aneurysm of cavernous carotid artery  . Anemia, unspecified   . Unspecified gastritis and gastroduodenitis without mention of hemorrhage   . 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 Other and unspecified nonspecific immunological findings     positive ANA  . History of Epstein-Barr virus infection   . Herpes simplex without mention of complication   . Insomnia, unspecified   . Unspecified hypothyroidism   . Hyposmolality and/or hyponatremia   . Unspecified vitamin D deficiency   . Pure hypercholesterolemia   . Other B-complex deficiencies   . Osteoarthrosis of knee     bilaterally  .  Closed fracture of unspecified part of vertebral column without mention of spinal cord injury     T7 s/p kyphoplasty, T12  . Unspecified essential hypertension   . Pain in joint, site unspecified     severe, diffuse, chronic pain  . Osteoarthrosis, hip   . Edema 2015  . CKD stage 2 due to  type 2 diabetes mellitus ([place]) 11/18/2014  . Neuropathy ([place])   . Positive ANA (antinuclear antibody)   . Insomnia   . Pulmonary embolism ([place]) 05/07/2015    Patient Active Problem List   Diagnosis Date Noted  . Epistaxis 05/26/2015  . Osteoporosis 05/26/2015  . Hyponatremia 05/26/2015  . Chest pain 05/07/2015  . Pulmonary embolus ([place]) 05/07/2015  . Chronic pain syndrome 04/16/2015  . Muscle spasm of right leg 03/31/2015  . Fracture of multiple pubic rami ([place]) 03/30/2015  . UTI (urinary tract infection) 03/27/2015  . Protein-calorie malnutrition ([place]) 03/27/2015  . GERD (gastroesophageal reflux disease) 03/24/2015  . Constipation 03/24/2015  . Fracture, intertrochanteric, right femur ([place])   . CKD (chronic kidney disease) stage 2, GFR 60-89 ml/min 11/18/2014  . Osteoarthritis 03/25/2014  . Osteoarthrosis, forearm   . Senile osteoporosis   . Malignant neoplasm of breast (female), unspecified site   . Insomnia   . Hypothyroidism   . Pure hypercholesterolemia   . Closed fracture of unspecified part of vertebral column without mention of spinal cord injury   . Essential hypertension   . Osteoarthrosis, hip   . Edema     Allergies  Allergen Reactions  . Aspirin     Drop in body temp with large quantities, can tolerate low doses of aspirin  . Penicillins Swelling    Has patient had a PCN reaction causing immediate rash, facial/tongue/throat swelling, SOB or lightheadedness with hypotension: No Has patient had a PCN reaction causing severe rash involving mucus membranes or skin necrosis: No Has patient had a PCN reaction that required hospitalization No Has patient had a PCN reaction occurring within the last 10 years: No If all of the above answers are "NO", then may proceed with Cephalosporin use.    Medications: Patient's Medications  New Prescriptions   No medications on file  Previous Medications   AMLODIPINE (NORVASC) 5 MG TABLET    One daly to control BP    APIXABAN (ELIQUIS) 5 MG TABS TABLET    Take 1 tablet (5 mg total) by mouth 2 (two) times daily.   ATENOLOL (TENORMIN) 25 MG TABLET    Take one tablet by mouth once daily for blood pressure   CHOLECALCIFEROL (VITAMIN D) 1000 UNITS TABLET    Take 3,000 Units by mouth daily.   CYANOCOBALAMIN (VITAMIN B-12 CR PO)    Take 1 tablet by mouth daily.    DENOSUMAB (PROLIA) 60 MG/ML SOLN INJECTION    Inject 60 mg into the skin every 6 (six) months. Administer in upper arm, thigh, or abdomen   IRBESARTAN (AVAPRO) 300 MG TABLET    One daily to control BP   LEVOTHYROXINE (SYNTHROID, LEVOTHROID) 112 MCG TABLET    Take one tablet by mouth once daily for thyroid supplement   LIDOCAINE (ASPERCREME LIDOCAINE) 4 % PTCH    Apply 3 patches topically once. Apply 1 patch to the left hip, left knee, and the right shoulder in the morning. Remove in the evening.   OMEPRAZOLE (PRILOSEC) 20 MG CAPSULE    Take 1 capsule (20 mg total) by mouth daily.   OXYCODONE-ACETAMINOPHEN (PERCOCET) 7.5-325 MG TABLET  Take one tablet at 10 AM, 1 PM, 8 PM, and 10 PM for pain control. May take an extra tablet between midnight and 6 AM if needed for pain control.   PROCHLORPERAZINE (COMPAZINE) 10 MG TABLET    Take 1 tablet (10 mg total) by mouth every 6 (six) hours as needed for nausea or vomiting.   ZOLPIDEM (AMBIEN) 10 MG TABLET    Take one tablet by mouth by mouth at bedtime for sleep  Modified Medications   No medications on file  Discontinued Medications   No medications on file    Physical Exam: Filed Vitals:   05/26/15 1536  BP: 142/92  Pulse: 59  Temp: 98.7 F (37.1 C)  TempSrc: Tympanic  Resp: 18   There is no weight on file to calculate BMI.  Physical Exam  Constitutional: She is oriented to person, place, and time. No distress.  Elderly. Frail. Slow in movement due to back pain.  HENT:  Right Ear: External ear normal.  Left Ear: External ear normal.  Nose: Nose normal.  Mouth/Throat: Oropharynx is clear and  moist. No oropharyngeal exudate.  Eyes: Conjunctivae and EOM are normal. Pupils are equal, round, and reactive to light.  Neck: No JVD present. No tracheal deviation present. No thyromegaly present.  Cardiovascular: Normal rate, regular rhythm, normal heart sounds and intact distal pulses.  Exam reveals no gallop and no friction rub.   No murmur heard. Pulmonary/Chest: No respiratory distress. She has no wheezes. She has no rales. She exhibits no tenderness.  Abdominal: She exhibits no distension and no mass. There is no tenderness.  Musculoskeletal: Normal range of motion. She exhibits edema and tenderness (Lumbar area and left knee).  Right hip pain with movement, very trace edema R leg  Lymphadenopathy:    She has no cervical adenopathy.  Neurological: She is alert and oriented to person, place, and time. She has normal reflexes.  10/22/2013 MMSE 30/30. Passed clock drawing  Skin: No rash noted. No erythema. No pallor.  Right hip surgical incision healing nicely. Left upper arm indurated area warmth and mild tenderness at the pneumococcal vac site.     Psychiatric: She has a normal mood and affect. Her behavior is normal. Judgment and thought content normal.    Labs reviewed: Basic Metabolic Panel:  Recent Labs  03/19/15 0415  03/23/15 0423  05/07/15 1209 05/08/15 0615 05/14/15  NA 135  < > 135  < > 132* 136 128*  K 4.2  < > 3.5  < > 4.7 4.0 4.3  CL 107  < > 105  --  100* 107  --   CO2 22  < > 24  --  22 21*  --   GLUCOSE 170*  < > 104*  --  106* 91  --   BUN 24*  < > 17  < > 22* 20 24*  CREATININE 1.05*  < > 1.16*  < > 1.05* 1.04* 1.0  CALCIUM 7.9*  < > 7.3*  --  8.8* 7.9*  --   MG 2.0  --   --   --   --   --   --   < > = values in this interval not displayed.  Liver Function Tests:  Recent Labs  03/26/15 04/10/15 05/14/15  AST 15 14 12*  ALT 11 8 8   ALKPHOS 41 95 62    CBC:  Recent Labs  03/18/15 0237  05/07/15 1209 05/08/15 0615 05/09/15 0335 05/14/15    WBC  9.9  < > 7.7 4.8 5.2 6.1  NEUTROABS 8.0*  --   --   --   --   --   HGB 11.8*  < > 12.2 11.2* 12.2 12.9  HCT 34.1*  < > 35.5* 33.1* 35.7* 38  MCV 92.9  < > 96.7 97.4 97.0  --   PLT 165  < > 175 145* 175 181  < > = values in this interval not displayed.  Lab Results  Component Value Date   TSH 1.137 05/07/2015   No results found for: HGBA1C Lab Results  Component Value Date   CHOL 175 11/10/2014   HDL 53 11/10/2014   LDLCALC 87 11/10/2014   TRIG 173* 11/10/2014    Significant Diagnostic Results since last visit: none  Patient Care Team: Estill Dooms, MD as PCP - General (Internal Medicine) Rayaan Garguilo X, NP as Nurse Practitioner (Nurse Practitioner)  Assessment/Plan Problem List Items Addressed This Visit    Osteoporosis    Prolia 60mg  q6 months, continue Vit D      Insomnia    Stable, continue Zolpidem 10mg  nightly.       Hypothyroidism (Chronic)    03/09/15 TSH 2.62, continue Synthroid 144mcg.      Hyponatremia    05/14/15 Na 128 Update BMP      GERD (gastroesophageal reflux disease)    Stable, continue Omeprazole 20mg  daily.       Essential hypertension (Chronic)    Permissive blood pressure control, continue Atenolol 25mg , Amlodipine 5mg , Avapro 300mg  daily. Monitor BP, forehead pressure.       Epistaxis - Primary    05/23/15, 05/24/15, episodes x2, will apply ABT ointment to R+L nostrils daily, update CBC.       Chronic pain syndrome (Chronic)    Continue Oxycodone/Acetaminophine 7.5/325 qid, daily prn.           Family/ staff Communication: continue AL for care needs.   Labs/tests ordered: CBC, BMP   Bailey Ahriyah Vannest NP Geriatrics [facility] [address], [place] [address] On Call:  [phone] & follow prompts after 5pm & weekends Office Phone:  [phone] Office Fax:  [phone]

## 2015-05-26 NOTE — Assessment & Plan Note (Signed)
Prolia 60mg  q6 months, continue Vit D

## 2015-05-26 NOTE — Assessment & Plan Note (Addendum)
Permissive blood pressure control, continue Atenolol 25mg , Amlodipine 5mg , Avapro 300mg  daily. Monitor BP, forehead pressure.

## 2015-05-26 NOTE — Assessment & Plan Note (Signed)
03/09/15 TSH 2.62, continue Synthroid 112mcg.  

## 2015-05-26 NOTE — Assessment & Plan Note (Signed)
Stable, continue Omeprazole 20mg daily.  

## 2015-05-26 NOTE — Assessment & Plan Note (Signed)
Continue Oxycodone/Acetaminophine 7.5/325 qid, daily prn.

## 2015-05-26 NOTE — Assessment & Plan Note (Signed)
Stable, continue Zolpidem 10mg  nightly.

## 2015-05-28 DIAGNOSIS — R030 Elevated blood-pressure reading, without diagnosis of hypertension: Secondary | ICD-10-CM | POA: Diagnosis not present

## 2015-05-28 DIAGNOSIS — D649 Anemia, unspecified: Secondary | ICD-10-CM | POA: Diagnosis not present

## 2015-05-28 LAB — BASIC METABOLIC PANEL
BUN: 25 mg/dL — AB (ref 4–21)
CREATININE: 0.9 mg/dL (ref 0.5–1.1)
Glucose: 93 mg/dL
POTASSIUM: 4.1 mmol/L (ref 3.4–5.3)
Sodium: 130 mmol/L — AB (ref 137–147)

## 2015-05-28 LAB — CBC AND DIFFERENTIAL
HCT: 35 % — AB (ref 36–46)
HEMOGLOBIN: 11.7 g/dL — AB (ref 12.0–16.0)
PLATELETS: 138 10*3/uL — AB (ref 150–399)
WBC: 4.6 10*3/mL

## 2015-05-29 ENCOUNTER — Other Ambulatory Visit: Payer: Self-pay | Admitting: Nurse Practitioner

## 2015-05-29 DIAGNOSIS — E871 Hypo-osmolality and hyponatremia: Secondary | ICD-10-CM

## 2015-06-04 DIAGNOSIS — R52 Pain, unspecified: Secondary | ICD-10-CM | POA: Diagnosis not present

## 2015-06-04 DIAGNOSIS — R609 Edema, unspecified: Secondary | ICD-10-CM | POA: Diagnosis not present

## 2015-06-05 ENCOUNTER — Non-Acute Institutional Stay: Payer: Medicare Other | Admitting: Nurse Practitioner

## 2015-06-05 ENCOUNTER — Encounter: Payer: Self-pay | Admitting: Nurse Practitioner

## 2015-06-05 DIAGNOSIS — G4700 Insomnia, unspecified: Secondary | ICD-10-CM | POA: Diagnosis not present

## 2015-06-05 DIAGNOSIS — K219 Gastro-esophageal reflux disease without esophagitis: Secondary | ICD-10-CM | POA: Diagnosis not present

## 2015-06-05 DIAGNOSIS — R040 Epistaxis: Secondary | ICD-10-CM

## 2015-06-05 DIAGNOSIS — I10 Essential (primary) hypertension: Secondary | ICD-10-CM

## 2015-06-05 DIAGNOSIS — E039 Hypothyroidism, unspecified: Secondary | ICD-10-CM | POA: Diagnosis not present

## 2015-06-05 DIAGNOSIS — I2609 Other pulmonary embolism with acute cor pulmonale: Secondary | ICD-10-CM | POA: Diagnosis not present

## 2015-06-05 DIAGNOSIS — R609 Edema, unspecified: Secondary | ICD-10-CM | POA: Diagnosis not present

## 2015-06-05 DIAGNOSIS — G894 Chronic pain syndrome: Secondary | ICD-10-CM | POA: Diagnosis not present

## 2015-06-05 DIAGNOSIS — E871 Hypo-osmolality and hyponatremia: Secondary | ICD-10-CM

## 2015-06-05 NOTE — Assessment & Plan Note (Signed)
03/09/15 TSH 2.62, continue Synthroid 112mcg.  

## 2015-06-05 NOTE — Assessment & Plan Note (Signed)
No further bleeding.

## 2015-06-05 NOTE — Progress Notes (Signed)
Patient ID: Bailey [name], female   DOB: [birth year], 82 y.o.   MRN: [id number]  Location: [facility] Provider:  Marlana Latus NP  Code Status:  DNR Goals of care: Advanced Directive information    Chief Complaint  Patient presents with  . Medical Management of Chronic Issues  . Acute Visit    BLE edema, pain     HPI: Patient is a 82 y.o. female seen in the [facility] at [facility] today for evaluation of reported BLE pain and edema, requested from staff at [facility] for venous doppler 06/04/15 showed no evidence of bilateral lower extremity deep venous thrombosis, hx of nose bleed, 05/23/15 and 05/24/15, stopped by pressure. blood pressure, mildly elevated while on Atenolol 25mg  qd, Amlodipine 5mg  qd, Avapro 300mg  daily. C/o occasional forehead pressure, but not sure if its related to her blood pressure.   Hx of PE: chronic Eliquis  UTI, urine culture 03/25/15 Providencia Stuartii, 7 day course of Septra DS started 03/25/15, tolerated.  Post op anemia, Hgb 9.8 03/25/14.   hospital stay 03/18/15-03/23/15 for comminuted right peritrochateric femur fracture/ mildly displaced fracture junction of right superior pubic ramus and acetabulum/ Mildly displaced fracture right inferior pubic ramus. Patient status post supplementary fixation right peritrochaneric femur fracture per Dr. Lyla Glassing 03/19/2015. follow-up with orthjopedics in 2 weeks.   Review of Systems  Constitutional: Negative for fever, chills, weight loss, malaise/fatigue and diaphoresis.  HENT: Negative for congestion, ear discharge, ear pain, hearing loss, nosebleeds and tinnitus.        Sinus allergies  Eyes: Negative for blurred vision, double vision, photophobia and pain.  Respiratory: Positive for shortness of breath (On exertion). Negative for cough and wheezing.   Cardiovascular: Positive for leg swelling. Negative for chest pain and palpitations.       Reported BLE pain and edema, requested from staff at [facility] for venous doppler 06/04/15 showed no  evidence of bilateral lower extremity deep venous thrombosis  Gastrointestinal: Positive for constipation. Negative for heartburn, nausea, vomiting and abdominal pain.       Flatulence. Hemorrhoids.  Genitourinary: Negative.  Negative for dysuria, urgency and frequency.  Musculoskeletal: Positive for back pain and joint pain.       History of osteoporosis. Pain in the left knee and both hips.  Skin: Negative.  Negative for itching and rash.       Right hip surgical incision healed  Neurological: Negative for dizziness, tremors, sensory change, speech change, focal weakness and headaches.  Psychiatric/Behavioral: Negative for depression, suicidal ideas, hallucinations, memory loss and substance abuse. The patient has insomnia. The patient is not nervous/anxious.     Past Medical History  Diagnosis Date  . Osteoarthrosis, unspecified whether generalized or localized, forearm     bilaterally wrist  . Allergic rhinitis, cause unspecified   . Senile osteoporosis   . Malignant neoplasm of breast (female), unspecified site 1990    left. Had surgerey and chemo, but no radiation  . Acquired cyst of kidney     left  . Left cavernous carotid aneurysm 08/2008    1-1/43mm aneurysm of cavernous carotid artery  . Anemia, unspecified   . Unspecified gastritis and gastroduodenitis without mention of hemorrhage   . 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 Other and unspecified nonspecific immunological findings     positive ANA  . History of Epstein-Barr virus infection   . Herpes simplex without mention of complication   . Insomnia, unspecified   .  Unspecified hypothyroidism   . Hyposmolality and/or hyponatremia   . Unspecified vitamin D deficiency   . Pure hypercholesterolemia   . Other B-complex deficiencies   . Osteoarthrosis of knee     bilaterally  . Closed fracture of unspecified part of vertebral column without mention of spinal cord injury      T7 s/p kyphoplasty, T12  . Unspecified essential hypertension   . Pain in joint, site unspecified     severe, diffuse, chronic pain  . Osteoarthrosis, hip   . Edema 2015  . CKD stage 2 due to type 2 diabetes mellitus ([place]) 11/18/2014  . Neuropathy ([place])   . Positive ANA (antinuclear antibody)   . Insomnia   . Pulmonary embolism ([place]) 05/07/2015    Patient Active Problem List   Diagnosis Date Noted  . Epistaxis 05/26/2015  . Osteoporosis 05/26/2015  . Hyponatremia 05/26/2015  . Chest pain 05/07/2015  . Pulmonary embolus ([place]) 05/07/2015  . Chronic pain syndrome 04/16/2015  . Muscle spasm of right leg 03/31/2015  . Fracture of multiple pubic rami ([place]) 03/30/2015  . UTI (urinary tract infection) 03/27/2015  . Protein-calorie malnutrition ([place]) 03/27/2015  . GERD (gastroesophageal reflux disease) 03/24/2015  . Constipation 03/24/2015  . Fracture, intertrochanteric, right femur ([place])   . CKD (chronic kidney disease) stage 2, GFR 60-89 ml/min 11/18/2014  . Osteoarthritis 03/25/2014  . Osteoarthrosis, forearm   . Senile osteoporosis   . Malignant neoplasm of breast (female), unspecified site   . Insomnia   . Hypothyroidism   . Pure hypercholesterolemia   . Closed fracture of unspecified part of vertebral column without mention of spinal cord injury   . Essential hypertension   . Osteoarthrosis, hip   . Edema     Allergies  Allergen Reactions  . Aspirin     Drop in body temp with large quantities, can tolerate low doses of aspirin  . Penicillins Swelling    Has patient had a PCN reaction causing immediate rash, facial/tongue/throat swelling, SOB or lightheadedness with hypotension: No Has patient had a PCN reaction causing severe rash involving mucus membranes or skin necrosis: No Has patient had a PCN reaction that required hospitalization No Has patient had a PCN reaction occurring within the last 10 years: No If all of the above answers are "NO", then may proceed with  Cephalosporin use.    Medications: Patient's Medications  New Prescriptions   No medications on file  Previous Medications   AMLODIPINE (NORVASC) 5 MG TABLET    One daly to control BP   APIXABAN (ELIQUIS) 5 MG TABS TABLET    Take 1 tablet (5 mg total) by mouth 2 (two) times daily.   ATENOLOL (TENORMIN) 25 MG TABLET    Take one tablet by mouth once daily for blood pressure   CHOLECALCIFEROL (VITAMIN D) 1000 UNITS TABLET    Take 3,000 Units by mouth daily.   CYANOCOBALAMIN (VITAMIN B-12 CR PO)    Take 1 tablet by mouth daily.    DENOSUMAB (PROLIA) 60 MG/ML SOLN INJECTION    Inject 60 mg into the skin every 6 (six) months. Administer in upper arm, thigh, or abdomen   IRBESARTAN (AVAPRO) 300 MG TABLET    One daily to control BP   LEVOTHYROXINE (SYNTHROID, LEVOTHROID) 112 MCG TABLET    Take one tablet by mouth once daily for thyroid supplement   LIDOCAINE (ASPERCREME LIDOCAINE) 4 % PTCH    Apply 3 patches topically once. Apply 1 patch to the left hip, left  knee, and the right shoulder in the morning. Remove in the evening.   OMEPRAZOLE (PRILOSEC) 20 MG CAPSULE    Take 1 capsule (20 mg total) by mouth daily.   OXYCODONE-ACETAMINOPHEN (PERCOCET) 7.5-325 MG TABLET    Take one tablet at 10 AM, 1 PM, 8 PM, and 10 PM for pain control. May take an extra tablet between midnight and 6 AM if needed for pain control.   PROCHLORPERAZINE (COMPAZINE) 10 MG TABLET    Take 1 tablet (10 mg total) by mouth every 6 (six) hours as needed for nausea or vomiting.   ZOLPIDEM (AMBIEN) 10 MG TABLET    Take one tablet by mouth by mouth at bedtime for sleep  Modified Medications   No medications on file  Discontinued Medications   No medications on file    Physical Exam: Filed Vitals:   06/05/15 1452  BP: 146/82  Pulse: 76  Temp: 97.8 F (36.6 C)  TempSrc: Tympanic  Resp: 20   There is no weight on file to calculate BMI.  Physical Exam  Constitutional: She is oriented to person, place, and time. No distress.   Elderly. Frail. Slow in movement due to back pain.  HENT:  Right Ear: External ear normal.  Left Ear: External ear normal.  Nose: Nose normal.  Mouth/Throat: Oropharynx is clear and moist. No oropharyngeal exudate.  Eyes: Conjunctivae and EOM are normal. Pupils are equal, round, and reactive to light.  Neck: No JVD present. No tracheal deviation present. No thyromegaly present.  Cardiovascular: Normal rate, regular rhythm, normal heart sounds and intact distal pulses.  Exam reveals no gallop and no friction rub.   No murmur heard. Pulmonary/Chest: No respiratory distress. She has no wheezes. She has no rales. She exhibits no tenderness.  Abdominal: She exhibits no distension and no mass. There is no tenderness.  Musculoskeletal: Normal range of motion. She exhibits edema and tenderness (Lumbar area and left knee).  Right hip pain with movement, BLE edema 1+, mainly in ankles and feet.   Lymphadenopathy:    She has no cervical adenopathy.  Neurological: She is alert and oriented to person, place, and time. She has normal reflexes.  10/22/2013 MMSE 30/30. Passed clock drawing  Skin: No rash noted. No erythema. No pallor.  Right hip surgical incision healed    Psychiatric: She has a normal mood and affect. Her behavior is normal. Judgment and thought content normal.    Labs reviewed: Basic Metabolic Panel:  Recent Labs  03/19/15 0415  03/23/15 0423  05/07/15 1209 05/08/15 0615 05/14/15 05/28/15  NA 135  < > 135  < > 132* 136 128* 130*  K 4.2  < > 3.5  < > 4.7 4.0 4.3 4.1  CL 107  < > 105  --  100* 107  --   --   CO2 22  < > 24  --  22 21*  --   --   GLUCOSE 170*  < > 104*  --  106* 91  --   --   BUN 24*  < > 17  < > 22* 20 24* 25*  CREATININE 1.05*  < > 1.16*  < > 1.05* 1.04* 1.0 0.9  CALCIUM 7.9*  < > 7.3*  --  8.8* 7.9*  --   --   MG 2.0  --   --   --   --   --   --   --   < > = values in this interval not  displayed.  Liver Function Tests:  Recent Labs  03/26/15  04/10/15 05/14/15  AST 15 14 12*  ALT 11 8 8   ALKPHOS 41 95 62    CBC:  Recent Labs  03/18/15 0237  05/07/15 1209 05/08/15 0615 05/09/15 0335 05/14/15 05/28/15  WBC 9.9  < > 7.7 4.8 5.2 6.1 4.6  NEUTROABS 8.0*  --   --   --   --   --   --   HGB 11.8*  < > 12.2 11.2* 12.2 12.9 11.7*  HCT 34.1*  < > 35.5* 33.1* 35.7* 38 35*  MCV 92.9  < > 96.7 97.4 97.0  --   --   PLT 165  < > 175 145* 175 181 138*  < > = values in this interval not displayed.  Lab Results  Component Value Date   TSH 1.137 05/07/2015   No results found for: HGBA1C Lab Results  Component Value Date   CHOL 175 11/10/2014   HDL 53 11/10/2014   LDLCALC 87 11/10/2014   TRIG 173* 11/10/2014    Significant Diagnostic Results since last visit: none  Patient Care Team: Estill Dooms, MD as PCP - General (Internal Medicine) Kaleiyah Polsky X, NP as Nurse Practitioner (Nurse Practitioner)  Assessment/Plan Problem List Items Addressed This Visit    Pulmonary embolus ([place]) (Chronic)    Chronic Eliquis       Insomnia    Stable, continue Zolpidem 10mg  nightly.       Hypothyroidism (Chronic)    03/09/15 TSH 2.62, continue Synthroid 144mcg.      Hyponatremia    05/14/15 Na 128 05/28/15 Na 130      GERD (gastroesophageal reflux disease)    Stable, continue Omeprazole 20mg  daily.       Essential hypertension (Chronic)    Permissive blood pressure control, continue Atenolol 25mg , Amlodipine 5mg , Avapro 300mg  daily. Monitor BP, forehead pressure.      Epistaxis    No further bleeding.       Edema - Primary (Chronic)    reported BLE pain and edema, requested from staff at [facility] for venous doppler 06/04/15 showed no evidence of bilateral lower extremity deep venous thrombosis      Chronic pain syndrome (Chronic)    Continue Oxycodone/Acetaminophine 7.5/325 qid, daily prn.           Family/ staff Communication: continue AL for care needs.   Labs/tests ordered: venous US BLE done 06/04/15  [facility]  Lilian Fuhs NP Geriatrics [facility] [address], [place] [address] On Call:  [phone] & follow prompts after 5pm & weekends Office Phone:  [phone] Office Fax:  [phone]

## 2015-06-05 NOTE — Assessment & Plan Note (Signed)
reported BLE pain and edema, requested from staff at [facility] for venous doppler 06/04/15 showed no evidence of bilateral lower extremity deep venous thrombosis

## 2015-06-05 NOTE — Assessment & Plan Note (Signed)
05/14/15 Na 128 05/28/15 Na 130

## 2015-06-05 NOTE — Assessment & Plan Note (Signed)
Stable, continue Omeprazole 20mg daily.  

## 2015-06-05 NOTE — Assessment & Plan Note (Signed)
Permissive blood pressure control, continue Atenolol 25mg , Amlodipine 5mg , Avapro 300mg  daily. Monitor BP, forehead pressure.

## 2015-06-05 NOTE — Assessment & Plan Note (Signed)
Chronic Eliquis 

## 2015-06-05 NOTE — Assessment & Plan Note (Signed)
Stable, continue Zolpidem 10mg  nightly.

## 2015-06-05 NOTE — Assessment & Plan Note (Signed)
Continue Oxycodone/Acetaminophine 7.5/325 qid, daily prn.

## 2015-06-09 ENCOUNTER — Encounter: Payer: Self-pay | Admitting: Nurse Practitioner

## 2015-06-09 ENCOUNTER — Non-Acute Institutional Stay: Payer: Medicare Other | Admitting: Internal Medicine

## 2015-06-09 DIAGNOSIS — R609 Edema, unspecified: Secondary | ICD-10-CM

## 2015-06-09 DIAGNOSIS — I10 Essential (primary) hypertension: Secondary | ICD-10-CM | POA: Diagnosis not present

## 2015-06-09 NOTE — Progress Notes (Signed)
This encounter was created in error - please disregard.

## 2015-06-09 NOTE — Progress Notes (Signed)
Patient ID: Bailey [name], female   DOB: 1926/05/26, 82 y.o.   MRN: [id number]    [place] Room Number: al 35  Place of Service: ALF (13)     Allergies  Allergen Reactions  . Aspirin     Drop in body temp with large quantities, can tolerate low doses of aspirin  . Penicillins Swelling    Has patient had a PCN reaction causing immediate rash, facial/tongue/throat swelling, SOB or lightheadedness with hypotension: No Has patient had a PCN reaction causing severe rash involving mucus membranes or skin necrosis: No Has patient had a PCN reaction that required hospitalization No Has patient had a PCN reaction occurring within the last 10 years: No If all of the above answers are "NO", then may proceed with Cephalosporin use.    Chief Complaint  Patient presents with  . Acute Visit    EDEMA    HPI:  I was asked to see patient acutely due to problems of increasing edema. It is then 1-2+ bilaterally. There is no pain in feet. Venous Doppler bilaterally was negative. There've been no adjustments in medications recently. Patient does have a history of pelvic fracture which could be contributing to this problem. Patient is hypertensive and this has been controlled with amlodipine and Avapro. Amlodipine may be creating problems with edema.  Medications: Patient's Medications  New Prescriptions   No medications on file  Previous Medications   AMLODIPINE (NORVASC) 5 MG TABLET    One daly to control BP   APIXABAN (ELIQUIS) 5 MG TABS TABLET    Take 1 tablet (5 mg total) by mouth 2 (two) times daily.   ATENOLOL (TENORMIN) 25 MG TABLET    Take one tablet by mouth once daily for blood pressure   CHOLECALCIFEROL (VITAMIN D) 1000 UNITS TABLET    Take 3,000 Units by mouth daily.   CYANOCOBALAMIN (VITAMIN B-12 CR PO)    Take 1 tablet by mouth daily.    DENOSUMAB (PROLIA) 60 MG/ML SOLN INJECTION    Inject 60 mg into the skin every 6 (six) months. Administer in upper arm,  thigh, or abdomen   IRBESARTAN (AVAPRO) 300 MG TABLET    One daily to control BP   LEVOTHYROXINE (SYNTHROID, LEVOTHROID) 112 MCG TABLET    Take one tablet by mouth once daily for thyroid supplement   LIDOCAINE (ASPERCREME LIDOCAINE) 4 % PTCH    Apply 3 patches topically once. Apply 1 patch to the left hip, left knee, and the right shoulder in the morning. Remove in the evening.   OMEPRAZOLE (PRILOSEC) 20 MG CAPSULE    Take 1 capsule (20 mg total) by mouth daily.   OXYCODONE-ACETAMINOPHEN (PERCOCET) 7.5-325 MG TABLET    Take one tablet at 10 AM, 1 PM, 8 PM, and 10 PM for pain control. May take an extra tablet between midnight and 6 AM if needed for pain control.   PROCHLORPERAZINE (COMPAZINE) 10 MG TABLET    Take 1 tablet (10 mg total) by mouth every 6 (six) hours as needed for nausea or vomiting.   ZOLPIDEM (AMBIEN) 10 MG TABLET    Take one tablet by mouth by mouth at bedtime for sleep  Modified Medications   No medications on file  Discontinued Medications   No medications on file     Review of Systems  Constitutional: Negative for fever, chills and diaphoresis.  HENT: Negative for congestion, ear discharge, ear pain, hearing loss, nosebleeds and tinnitus.  Sinus allergies  Eyes: Negative for photophobia and pain.  Respiratory: Positive for shortness of breath (On exertion). Negative for cough and wheezing.   Cardiovascular: Positive for leg swelling. Negative for chest pain and palpitations.       Reported BLE pain and edema, requested from staff at [facility] for venous doppler 06/04/15 showed no evidence of bilateral lower extremity deep venous thrombosis  Gastrointestinal: Positive for constipation. Negative for nausea, vomiting and abdominal pain.       Flatulence. Hemorrhoids.  Genitourinary: Negative.  Negative for dysuria, urgency and frequency.  Musculoskeletal: Positive for back pain.       History of osteoporosis. Pain in the left knee and both hips.  Skin: Negative.  Negative  for rash.       Right hip surgical incision healed  Neurological: Negative for dizziness, tremors and headaches.  Psychiatric/Behavioral: Negative for suicidal ideas and hallucinations. The patient is not nervous/anxious.     Filed Vitals:   06/09/15 1217  BP: 121/78  Pulse: 80  Temp: 98.6 F (37 C)  Resp: 14  Height: '5\' 5"'  (1.651 m)  Weight: 127 lb (57.607 kg)   Wt Readings from Last 3 Encounters:  06/09/15 127 lb (57.607 kg)  05/14/15 127 lb (57.607 kg)  05/12/15 123 lb 12.8 oz (56.155 kg)    Body mass index is 21.13 kg/(m^2).  Physical Exam  Constitutional: She is oriented to person, place, and time. No distress.  Elderly. Frail. Slow in movement due to back pain.  HENT:  Right Ear: External ear normal.  Left Ear: External ear normal.  Nose: Nose normal.  Mouth/Throat: Oropharynx is clear and moist. No oropharyngeal exudate.  Eyes: Conjunctivae and EOM are normal. Pupils are equal, round, and reactive to light.  Neck: No JVD present. No tracheal deviation present. No thyromegaly present.  Cardiovascular: Normal rate, regular rhythm, normal heart sounds and intact distal pulses.  Exam reveals no gallop and no friction rub.   No murmur heard. Pulmonary/Chest: No respiratory distress. She has no wheezes. She has no rales. She exhibits no tenderness.  Abdominal: She exhibits no distension and no mass. There is no tenderness.  Musculoskeletal: Normal range of motion. She exhibits edema and tenderness (Lumbar area and left knee).  Right hip pain with movement, BLE edema 1+, mainly in ankles and feet.   Lymphadenopathy:    She has no cervical adenopathy.  Neurological: She is alert and oriented to person, place, and time. She has normal reflexes.  10/22/2013 MMSE 30/30. Passed clock drawing  Skin: No rash noted. No erythema. No pallor.  Right hip surgical incision healed    Psychiatric: She has a normal mood and affect. Her behavior is normal. Judgment and thought content  normal.     Labs reviewed: Lab Summary Latest Ref Rng 05/28/2015 05/14/2015 05/09/2015 05/08/2015  Hemoglobin 12.0 - 16.0 g/dL 11.7(A) 12.9 12.2 11.2(L)  Hematocrit 36 - 46 % 35(A) 38 35.7(L) 33.1(L)  White count - 4.6 6.1 5.2 4.8  Platelet count 150 - 399 K/L 138(A) 181 175 145(L)  Sodium 137 - 147 mmol/L 130(A) 128(A) (None) 136  Potassium 3.4 - 5.3 mmol/L 4.1 4.3 (None) 4.0  Calcium 8.9 - 10.3 mg/dL (None) (None) (None) 7.9(L)  Phosphorus - (None) (None) (None) (None)  Creatinine 0.5 - 1.1 mg/dL 0.9 1.0 (None) 1.04(H)  AST 13 - 35 U/L (None) 12(A) (None) (None)  Alk Phos 25 - 125 U/L (None) 62 (None) (None)  Bilirubin - (None) (None) (None) (None)  Glucose -  93 97 (None) 91  Cholesterol - (None) (None) (None) (None)  HDL cholesterol - (None) (None) (None) (None)  Triglycerides - (None) (None) (None) (None)  LDL Direct - (None) (None) (None) (None)  LDL Calc - (None) (None) (None) (None)  Total protein - (None) (None) (None) (None)  Albumin - (None) (None) (None) (None)   Lab Results  Component Value Date   TSH 1.137 05/07/2015   Lab Results  Component Value Date   BUN 25* 05/28/2015   BUN 24* 05/14/2015   BUN 20 05/08/2015   Lab Results  Component Value Date   CREATININE 0.9 05/28/2015   CREATININE 1.0 05/14/2015   CREATININE 1.04* 05/08/2015   No results found for: HGBA1C  06/04/2015 venous Doppler bilaterally: Within normal limits.   Assessment/Plan  1. Edema, unspecified type Discontinue amlodipine Start HCTZ 25 mg daily  2. Essential hypertension Stopped amlodipine due to edema and started hydrochlorothiazide 25 mg daily

## 2015-06-17 DIAGNOSIS — S72101D Unspecified trochanteric fracture of right femur, subsequent encounter for closed fracture with routine healing: Secondary | ICD-10-CM | POA: Diagnosis not present

## 2015-06-30 ENCOUNTER — Encounter: Payer: Self-pay | Admitting: Internal Medicine

## 2015-06-30 ENCOUNTER — Non-Acute Institutional Stay: Payer: Medicare Other | Admitting: Internal Medicine

## 2015-06-30 VITALS — BP 160/80 | HR 64 | Temp 97.5°F | Resp 20 | Ht 65.0 in | Wt 127.0 lb

## 2015-06-30 DIAGNOSIS — S72141A Displaced intertrochanteric fracture of right femur, initial encounter for closed fracture: Secondary | ICD-10-CM | POA: Diagnosis not present

## 2015-06-30 DIAGNOSIS — R609 Edema, unspecified: Secondary | ICD-10-CM | POA: Diagnosis not present

## 2015-06-30 DIAGNOSIS — S32501S Unspecified fracture of right pubis, sequela: Secondary | ICD-10-CM

## 2015-06-30 DIAGNOSIS — G894 Chronic pain syndrome: Secondary | ICD-10-CM

## 2015-06-30 DIAGNOSIS — I10 Essential (primary) hypertension: Secondary | ICD-10-CM | POA: Diagnosis not present

## 2015-06-30 DIAGNOSIS — K5900 Constipation, unspecified: Secondary | ICD-10-CM

## 2015-06-30 DIAGNOSIS — N182 Chronic kidney disease, stage 2 (mild): Secondary | ICD-10-CM

## 2015-06-30 DIAGNOSIS — S32591S Other specified fracture of right pubis, sequela: Secondary | ICD-10-CM

## 2015-06-30 DIAGNOSIS — I2609 Other pulmonary embolism with acute cor pulmonale: Secondary | ICD-10-CM

## 2015-06-30 MED ORDER — SENNOSIDES-DOCUSATE SODIUM 8.6-50 MG PO TABS
ORAL_TABLET | ORAL | Status: DC
Start: 2015-06-30 — End: 2017-06-05

## 2015-06-30 MED ORDER — DOXAZOSIN MESYLATE 4 MG PO TABS: ORAL_TABLET | ORAL | Status: DC

## 2015-06-30 MED ORDER — FUROSEMIDE 40 MG PO TABS: ORAL_TABLET | ORAL | Status: DC

## 2015-06-30 NOTE — Progress Notes (Signed)
Patient ID: Bailey [name], female   DOB: [birth year], 82 y.o.   MRN: [id number]    [place] Room Number: AL 35  Place of Service: ALF (13)     Allergies  Allergen Reactions  . Aspirin     Drop in body temp with large quantities, can tolerate low doses of aspirin  . Penicillins Swelling    Has patient had a PCN reaction causing immediate rash, facial/tongue/throat swelling, SOB or lightheadedness with hypotension: No Has patient had a PCN reaction causing severe rash involving mucus membranes or skin necrosis: No Has patient had a PCN reaction that required hospitalization No Has patient had a PCN reaction occurring within the last 10 years: No If all of the above answers are "NO", then may proceed with Cephalosporin use.    Chief Complaint  Patient presents with  . Acute Visit    nauseated some vomitimg, BP elevated, knees hurting alot lately    HPI:   Patient was nauseous earlier this week, but say it is doing better now.  Fracture of multiple pubic rami, right, sequela - improved pain control. Patient is now walking on her own with the use of walker. Strength and balance have improved.  Fracture, intertrochanteric, right femur, closed, initial encounter ([place]) - pain control and walking are better  Edema, unspecified type - lower legs are uncomfortable due to increasing edema. She has been on hydrochlorothiazide for blood pressure and edema, but is not helping either problem.  CKD (chronic kidney disease) stage 2, GFR 60-89 ml/min - no recent lab work  Chronic pain syndrome - increase in pain medication has been helpful with pain control. It has created more issues with constipation.  Other pulmonary embolism with acute cor pulmonale, unspecified chronicity ([place]) - patient has recovered and is breathing easily. She denies dyspnea.  Essential hypertension - blood pressures are now being recorded with systolic in the 496P.  Constipation,  unspecified constipation type - worse since an increase in her pain medication containing oxycodone.    Medications: Patient's Medications  New Prescriptions   No medications on file  Previous Medications   AMLODIPINE (NORVASC) 5 MG TABLET    One daly to control BP   APIXABAN (ELIQUIS) 5 MG TABS TABLET    Take 1 tablet (5 mg total) by mouth 2 (two) times daily.   ATENOLOL (TENORMIN) 25 MG TABLET    Take one tablet by mouth once daily for blood pressure   CHOLECALCIFEROL (VITAMIN D) 1000 UNITS TABLET    Take 3,000 Units by mouth daily.   CYANOCOBALAMIN (VITAMIN B-12 CR PO)    Take 1 tablet by mouth daily.    DENOSUMAB (PROLIA) 60 MG/ML SOLN INJECTION    Inject 60 mg into the skin every 6 (six) months. Administer in upper arm, thigh, or abdomen   IRBESARTAN (AVAPRO) 300 MG TABLET    One daily to control BP   LEVOTHYROXINE (SYNTHROID, LEVOTHROID) 112 MCG TABLET    Take one tablet by mouth once daily for thyroid supplement   LIDOCAINE (ASPERCREME LIDOCAINE) 4 % PTCH    Apply 3 patches topically once. Apply 1 patch to the left hip, left knee, and the right shoulder in the morning. Remove in the evening.   OMEPRAZOLE (PRILOSEC) 20 MG CAPSULE    Take 1 capsule (20 mg total) by mouth daily.   OXYCODONE-ACETAMINOPHEN (PERCOCET) 7.5-325 MG TABLET    Take one tablet at 10 AM, 1 PM, 8 PM, and 10  PM for pain control. May take an extra tablet between midnight and 6 AM if needed for pain control.   PROCHLORPERAZINE (COMPAZINE) 10 MG TABLET    Take 1 tablet (10 mg total) by mouth every 6 (six) hours as needed for nausea or vomiting.   ZOLPIDEM (AMBIEN) 10 MG TABLET    Take one tablet by mouth by mouth at bedtime for sleep  Modified Medications   No medications on file  Discontinued Medications   No medications on file     Review of Systems  Constitutional: Negative for fever, chills and diaphoresis.  HENT: Negative for congestion, ear discharge, ear pain, hearing loss, nosebleeds and tinnitus.         Sinus allergies  Eyes: Negative for photophobia and pain.  Respiratory: Positive for shortness of breath (On exertion). Negative for cough and wheezing.   Cardiovascular: Positive for leg swelling. Negative for chest pain and palpitations.       Reported BLE pain and edema, requested from staff at [facility] for venous doppler 06/04/15 showed no evidence of bilateral lower extremity deep venous thrombosis  Gastrointestinal: Positive for constipation. Negative for nausea, vomiting and abdominal pain.       Flatulence. Hemorrhoids.  Genitourinary: Negative.  Negative for dysuria, urgency and frequency.  Musculoskeletal: Positive for back pain.       History of osteoporosis. Pain in the left knee and both hips.  Skin: Negative.  Negative for rash.       Right hip surgical incision healed  Neurological: Negative for dizziness, tremors and headaches.  Psychiatric/Behavioral: Negative for suicidal ideas and hallucinations. The patient is not nervous/anxious.     Filed Vitals:   06/30/15 1051  BP: 160/80  Pulse: 64  Temp: 97.5 F (36.4 C)  TempSrc: Oral  Resp: 20  Height: '5\' 5"'  (1.651 m)  Weight: 127 lb (57.607 kg)  SpO2: 99%   Wt Readings from Last 3 Encounters:  06/30/15 127 lb (57.607 kg)  06/09/15 127 lb (57.607 kg)  05/14/15 127 lb (57.607 kg)    Body mass index is 21.13 kg/(m^2).  Physical Exam  Constitutional: She is oriented to person, place, and time. No distress.  Elderly. Frail. Slow in movement due to back pain.  HENT:  Right Ear: External ear normal.  Left Ear: External ear normal.  Nose: Nose normal.  Mouth/Throat: Oropharynx is clear and moist. No oropharyngeal exudate.  Eyes: Conjunctivae and EOM are normal. Pupils are equal, round, and reactive to light.  Neck: No JVD present. No tracheal deviation present. No thyromegaly present.  Cardiovascular: Normal rate, regular rhythm, normal heart sounds and intact distal pulses.  Exam reveals no gallop and no friction rub.     No murmur heard. Pulmonary/Chest: No respiratory distress. She has no wheezes. She has no rales. She exhibits no tenderness.  Abdominal: She exhibits no distension and no mass. There is no tenderness.  Musculoskeletal: Normal range of motion. She exhibits edema and tenderness (Lumbar area and left knee).  Right hip pain with movement, BLE edema 1+, mainly in ankles and feet.   Lymphadenopathy:    She has no cervical adenopathy.  Neurological: She is alert and oriented to person, place, and time. She has normal reflexes.  10/22/2013 MMSE 30/30. Passed clock drawing  Skin: No rash noted. No erythema. No pallor.  Right hip surgical incision healed.  Psychiatric: She has a normal mood and affect. Her behavior is normal. Judgment and thought content normal.     Labs reviewed:  Lab Summary Latest Ref Rng 05/28/2015 05/14/2015 05/09/2015 05/08/2015  Hemoglobin 12.0 - 16.0 g/dL 11.7(A) 12.9 12.2 11.2(L)  Hematocrit 36 - 46 % 35(A) 38 35.7(L) 33.1(L)  White count - 4.6 6.1 5.2 4.8  Platelet count 150 - 399 K/L 138(A) 181 175 145(L)  Sodium 137 - 147 mmol/L 130(A) 128(A) (None) 136  Potassium 3.4 - 5.3 mmol/L 4.1 4.3 (None) 4.0  Calcium 8.9 - 10.3 mg/dL (None) (None) (None) 7.9(L)  Phosphorus - (None) (None) (None) (None)  Creatinine 0.5 - 1.1 mg/dL 0.9 1.0 (None) 1.04(H)  AST 13 - 35 U/L (None) 12(A) (None) (None)  Alk Phos 25 - 125 U/L (None) 62 (None) (None)  Bilirubin - (None) (None) (None) (None)  Glucose - 93 97 (None) 91  Cholesterol - (None) (None) (None) (None)  HDL cholesterol - (None) (None) (None) (None)  Triglycerides - (None) (None) (None) (None)  LDL Direct - (None) (None) (None) (None)  LDL Calc - (None) (None) (None) (None)  Total protein - (None) (None) (None) (None)  Albumin - (None) (None) (None) (None)   Lab Results  Component Value Date   TSH 1.137 05/07/2015   Lab Results  Component Value Date   BUN 25* 05/28/2015   BUN 24* 05/14/2015   BUN 20 05/08/2015    Lab Results  Component Value Date   CREATININE 0.9 05/28/2015   CREATININE 1.0 05/14/2015   CREATININE 1.04* 05/08/2015   No results found for: HGBA1C     Assessment/Plan  1. Fracture of multiple pubic rami, right, sequela Improving  2. Fracture, intertrochanteric, right femur, closed, initial encounter ([place]) Improving  3. Edema, unspecified type Discontinue HCTZ - furosemide (LASIX) 40 MG tablet; One daily to help control edema  Dispense: 30 tablet; Refill: 3  4. CKD (chronic kidney disease) stage 2, GFR 60-89 ml/min BMP in 2 weeks  5. Chronic pain syndrome Better  6. Other pulmonary embolism with acute cor pulmonale, unspecified chronicity ([facility]) Resolved  7. Essential hypertension Add:  - doxazosin (CARDURA) 4 MG tablet; One daily to help control BP  Dispense: 30 tablet; Refill: 5  8. Constipation, unspecified constipation type Add:  - senna-docusate (SENOKOT S) 8.6-50 MG tablet; 2 tablets nightly to prevent constipation  Dispense: 60 tablet; Refill: 5

## 2015-07-13 DIAGNOSIS — I10 Essential (primary) hypertension: Secondary | ICD-10-CM | POA: Diagnosis not present

## 2015-07-13 LAB — BASIC METABOLIC PANEL
BUN: 28 mg/dL — AB (ref 4–21)
CREATININE: 1 mg/dL (ref 0.5–1.1)
Glucose: 98 mg/dL
Potassium: 4.5 mmol/L (ref 3.4–5.3)
Sodium: 131 mmol/L — AB (ref 137–147)

## 2015-07-14 ENCOUNTER — Encounter: Payer: Self-pay | Admitting: Nurse Practitioner

## 2015-07-16 ENCOUNTER — Other Ambulatory Visit: Payer: Self-pay

## 2015-07-16 DIAGNOSIS — G894 Chronic pain syndrome: Secondary | ICD-10-CM

## 2015-07-16 MED ORDER — OXYCODONE-ACETAMINOPHEN 7.5-325 MG PO TABS: ORAL_TABLET | ORAL | Status: DC

## 2015-07-16 NOTE — Telephone Encounter (Signed)
Refill requested for Percocet 7.5/325 mg tablets.   Prescription printed and placed in Dr. Cyndi Lennert folder for signing.

## 2015-07-21 ENCOUNTER — Non-Acute Institutional Stay: Payer: Medicare Other | Admitting: Internal Medicine

## 2015-07-21 ENCOUNTER — Encounter: Payer: Self-pay | Admitting: Internal Medicine

## 2015-07-21 VITALS — BP 122/78 | HR 74 | Temp 97.7°F | Resp 20 | Ht 65.0 in | Wt 135.0 lb

## 2015-07-21 DIAGNOSIS — G4700 Insomnia, unspecified: Secondary | ICD-10-CM | POA: Diagnosis not present

## 2015-07-21 DIAGNOSIS — E039 Hypothyroidism, unspecified: Secondary | ICD-10-CM

## 2015-07-21 DIAGNOSIS — I10 Essential (primary) hypertension: Secondary | ICD-10-CM | POA: Diagnosis not present

## 2015-07-21 DIAGNOSIS — N182 Chronic kidney disease, stage 2 (mild): Secondary | ICD-10-CM | POA: Diagnosis not present

## 2015-07-21 DIAGNOSIS — R609 Edema, unspecified: Secondary | ICD-10-CM

## 2015-07-21 DIAGNOSIS — G894 Chronic pain syndrome: Secondary | ICD-10-CM

## 2015-07-21 MED ORDER — SPIRONOLACTONE 25 MG PO TABS: ORAL_TABLET | ORAL | Status: DC

## 2015-07-21 MED ORDER — ZOLPIDEM TARTRATE 5 MG PO TABS: ORAL_TABLET | ORAL | Status: DC

## 2015-07-21 NOTE — Progress Notes (Signed)
Patient ID: Bailey [name], female   DOB: 04/07/1927, 82 y.o.   MRN: [id number]    [place] Room Number: AL 35  Place of Service: ALF (13)     Allergies  Allergen Reactions  . Aspirin     Drop in body temp with large quantities, can tolerate low doses of aspirin  . Penicillins Swelling    Has patient had a PCN reaction causing immediate rash, facial/tongue/throat swelling, SOB or lightheadedness with hypotension: No Has patient had a PCN reaction causing severe rash involving mucus membranes or skin necrosis: No Has patient had a PCN reaction that required hospitalization No Has patient had a PCN reaction occurring within the last 10 years: No If all of the above answers are "NO", then may proceed with Cephalosporin use.    Chief Complaint  Patient presents with  . Acute Visit    Left hand and leg edema,     HPI:  Edema, unspecified type - patient has had increased swelling of the legs bilaterally but worse on the left side as well as the left hand for several weeks. The left wrist is tender and swollen. Patient sleeps in a recliner chair each night and legs are slightly dependent position. She does try to elevate them with the recliner chair and put a pillow underneath her legs. She sleeps in the recliner chair for comfort because of her diffuse arthritis and pains associated with that.  Chronic pain syndrome - patient is on multiple doses of oxycodone 7.5 mg. She takes 4 during the day and 1 as needed at night. Pains are related to her osteoarthritis and recent injuries of her hip and pubic bones. She has acquired a tolerance to the oxycodone over time. She finds that it is no longer is helpful as it used to be. She is asking if medication would be better for her.  Essential hypertension - controlled  Hypothyroidism, unspecified hypothyroidism type - compensated  CKD (chronic kidney disease) stage 2, GFR 60-89 ml/min - stable  Insomnia - patient is  here with her son, Shanon Brow, who is worried about the combination of zolpidem plus her narcotics at night. He believes that she has had a at least 4 occasions episodes of confusion and an increased proclivity to falling. He would like her to stop the zolpidem altogether. Patient states that she uses zolpidem rather than increase her pain medication so that she can sleep at night. She is reluctant to make changes.  Medications: Patient's Medications  New Prescriptions   No medications on file  Previous Medications   AMLODIPINE (NORVASC) 5 MG TABLET       APIXABAN (ELIQUIS) 5 MG TABS TABLET    Take 1 tablet (5 mg total) by mouth 2 (two) times daily.   ATENOLOL (TENORMIN) 25 MG TABLET    Take one tablet by mouth once daily for blood pressure   CHOLECALCIFEROL (VITAMIN D) 1000 UNITS TABLET    Take 3,000 Units by mouth daily.   CYANOCOBALAMIN (VITAMIN B-12 CR PO)    Take 1 tablet by mouth daily.    DENOSUMAB (PROLIA) 60 MG/ML SOLN INJECTION    Inject 60 mg into the skin every 6 (six) months. Reported on 07/21/2015   DOXAZOSIN (CARDURA) 4 MG TABLET    One daily to help control BP   FUROSEMIDE (LASIX) 40 MG TABLET    One daily to help control edema   IRBESARTAN (AVAPRO) 300 MG TABLET    One daily  to control BP   LEVOTHYROXINE (SYNTHROID, LEVOTHROID) 112 MCG TABLET    Take one tablet by mouth once daily for thyroid supplement   LIDOCAINE (ASPERCREME LIDOCAINE) 4 % PTCH    Apply 3 patches topically once. Apply 1 patch to the left hip, left knee, and the right shoulder in the morning. Remove in the evening.   OMEPRAZOLE (PRILOSEC) 20 MG CAPSULE    Take 1 capsule (20 mg total) by mouth daily.   OXYCODONE-ACETAMINOPHEN (PERCOCET) 7.5-325 MG TABLET    Take one tablet at 10 AM, 1 PM, 8 PM, and 10 PM for pain control. May take an extra tablet between midnight and 6 AM if needed for pain control.   PROCHLORPERAZINE (COMPAZINE) 10 MG TABLET    Take 1 tablet (10 mg total) by mouth every 6 (six) hours as needed for  nausea or vomiting.   SENNA-DOCUSATE (SENOKOT S) 8.6-50 MG TABLET    2 tablets nightly to prevent constipation   ZOLPIDEM (AMBIEN) 10 MG TABLET    Take one tablet by mouth by mouth at bedtime for sleep  Modified Medications   No medications on file  Discontinued Medications   AMLODIPINE (NORVASC) 5 MG TABLET    One daly to control BP     Review of Systems  Constitutional: Negative for fever, chills and diaphoresis.  HENT: Negative for congestion, ear discharge, ear pain, hearing loss, nosebleeds and tinnitus.        Sinus allergies  Eyes: Negative for photophobia and pain.  Respiratory: Positive for shortness of breath (On exertion). Negative for cough and wheezing.   Cardiovascular: Positive for leg swelling. Negative for chest pain and palpitations.       Reported BLE pain and edema. Venous doppler 06/04/15 showed no evidence of bilateral lower extremity deep venous thrombosis  Gastrointestinal: Positive for constipation. Negative for nausea, vomiting and abdominal pain.       Flatulence. Hemorrhoids.  Genitourinary: Negative.  Negative for dysuria, urgency and frequency.  Musculoskeletal: Positive for back pain and gait problem (using walker).       History of osteoporosis. Pain in the left knee and both hips.  Skin: Negative.  Negative for rash.       Right hip surgical incision healed  Neurological: Negative for dizziness, tremors and headaches.  Psychiatric/Behavioral: Negative for suicidal ideas and hallucinations. The patient is not nervous/anxious.     Filed Vitals:   07/21/15 0936  BP: 122/78  Pulse: 74  Temp: 97.7 F (36.5 C)  TempSrc: Oral  Resp: 20  Height: '5\' 5"'  (1.651 m)  Weight: 135 lb (61.236 kg)  SpO2: 98%   Wt Readings from Last 3 Encounters:  07/21/15 135 lb (61.236 kg)  06/30/15 127 lb (57.607 kg)  06/09/15 127 lb (57.607 kg)    Body mass index is 22.47 kg/(m^2).  Physical Exam  Constitutional: She is oriented to person, place, and time. No  distress.  Elderly. Frail. Slow in movement due to back pain.  HENT:  Right Ear: External ear normal.  Left Ear: External ear normal.  Nose: Nose normal.  Mouth/Throat: Oropharynx is clear and moist. No oropharyngeal exudate.  Eyes: Conjunctivae and EOM are normal. Pupils are equal, round, and reactive to light.  Neck: No JVD present. No tracheal deviation present. No thyromegaly present.  Cardiovascular: Normal rate, regular rhythm, normal heart sounds and intact distal pulses.  Exam reveals no gallop and no friction rub.   No murmur heard. Pulmonary/Chest: No respiratory distress. She has no  wheezes. She has no rales. She exhibits no tenderness.  Abdominal: She exhibits no distension and no mass. There is no tenderness.  Musculoskeletal: Normal range of motion. She exhibits edema and tenderness (Lumbar area and left knee).  Right hip pain with movement, BLE edema 1+, mainly in ankles and feet. Unstable on standing. Uses walker to maintain balance.  Lymphadenopathy:    She has no cervical adenopathy.  Neurological: She is alert and oriented to person, place, and time. She has normal reflexes.  10/22/2013 MMSE 30/30. Passed clock drawing  Skin: No rash noted. No erythema. No pallor.  Right hip surgical incision healed.  Psychiatric: She has a normal mood and affect. Her behavior is normal. Judgment and thought content normal.     Labs reviewed: Lab Summary Latest Ref Rng 07/13/2015 05/28/2015 05/14/2015 05/09/2015 05/08/2015  Hemoglobin 12.0 - 16.0 g/dL (None) 11.7(A) 12.9 12.2 11.2(L)  Hematocrit 36 - 46 % (None) 35(A) 38 35.7(L) 33.1(L)  White count - (None) 4.6 6.1 5.2 4.8  Platelet count 150 - 399 K/L (None) 138(A) 181 175 145(L)  Sodium 137 - 147 mmol/L 131(A) 130(A) 128(A) (None) 136  Potassium 3.4 - 5.3 mmol/L 4.5 4.1 4.3 (None) 4.0  Calcium 8.9 - 10.3 mg/dL (None) (None) (None) (None) 7.9(L)  Phosphorus - (None) (None) (None) (None) (None)  Creatinine 0.5 - 1.1 mg/dL 1.0 0.9  1.0 (None) 1.04(H)  AST 13 - 35 U/L (None) (None) 12(A) (None) (None)  Alk Phos 25 - 125 U/L (None) (None) 62 (None) (None)  Bilirubin - (None) (None) (None) (None) (None)  Glucose - 98 93 97 (None) 91  Cholesterol - (None) (None) (None) (None) (None)  HDL cholesterol - (None) (None) (None) (None) (None)  Triglycerides - (None) (None) (None) (None) (None)  LDL Direct - (None) (None) (None) (None) (None)  LDL Calc - (None) (None) (None) (None) (None)  Total protein - (None) (None) (None) (None) (None)  Albumin - (None) (None) (None) (None) (None)   Lab Results  Component Value Date   TSH 1.137 05/07/2015   Lab Results  Component Value Date   BUN 28* 07/13/2015   BUN 25* 05/28/2015   BUN 24* 05/14/2015   Lab Results  Component Value Date   CREATININE 1.0 07/13/2015   CREATININE 0.9 05/28/2015   CREATININE 1.0 05/14/2015   No results found for: HGBA1C     Assessment/Plan  1. Edema, unspecified type -Discussed compression stockings, but she says she cannot use them. A previous physician told her that her peripheral circulation is too weak to sustain the compression stockings and that she would have complications. -Continue furosemide 40 mg daily which she present takes - spironolactone (ALDACTONE) 25 MG tablet; One each morning to reduce edema and to strengthen the heart  Dispense: 30 tablet; Refill: 5  2. Chronic pain syndrome Discussed switching to various medications, such as methadone. Patient is really not in favor of this continue current medications as they presently given.  3. Essential hypertension Controlled  4. Hypothyroidism, unspecified hypothyroidism type Compensated  5. CKD (chronic kidney disease) stage 2, GFR 60-89 ml/min Stable  6. Insomnia Reduce 10 mg zolpidem to a new dosage of 5 mg - zolpidem (AMBIEN) 5 MG tablet; 1 at nighttime if needed for sleep  Dispense: 30 tablet; Refill: 5

## 2015-07-22 ENCOUNTER — Other Ambulatory Visit: Payer: Self-pay | Admitting: *Deleted

## 2015-07-22 DIAGNOSIS — G4700 Insomnia, unspecified: Secondary | ICD-10-CM

## 2015-07-22 MED ORDER — ZOLPIDEM TARTRATE 5 MG PO TABS: ORAL_TABLET | ORAL | Status: DC

## 2015-07-22 NOTE — Telephone Encounter (Signed)
[facility] Fax Orders

## 2015-08-04 ENCOUNTER — Ambulatory Visit: Payer: Self-pay | Admitting: Internal Medicine

## 2015-08-07 ENCOUNTER — Other Ambulatory Visit: Payer: Self-pay | Admitting: *Deleted

## 2015-08-07 DIAGNOSIS — G894 Chronic pain syndrome: Secondary | ICD-10-CM

## 2015-08-07 MED ORDER — OXYCODONE-ACETAMINOPHEN 7.5-325 MG PO TABS: ORAL_TABLET | ORAL | Status: DC

## 2015-08-07 NOTE — Telephone Encounter (Signed)
[facility] Fax Order 

## 2015-08-25 ENCOUNTER — Encounter: Payer: Self-pay | Admitting: Nurse Practitioner

## 2015-08-25 ENCOUNTER — Non-Acute Institutional Stay: Payer: Medicare Other | Admitting: Nurse Practitioner

## 2015-08-25 DIAGNOSIS — G894 Chronic pain syndrome: Secondary | ICD-10-CM | POA: Diagnosis not present

## 2015-08-25 DIAGNOSIS — K5900 Constipation, unspecified: Secondary | ICD-10-CM

## 2015-08-25 DIAGNOSIS — K219 Gastro-esophageal reflux disease without esophagitis: Secondary | ICD-10-CM

## 2015-08-25 DIAGNOSIS — R609 Edema, unspecified: Secondary | ICD-10-CM

## 2015-08-25 DIAGNOSIS — E039 Hypothyroidism, unspecified: Secondary | ICD-10-CM

## 2015-08-25 DIAGNOSIS — E871 Hypo-osmolality and hyponatremia: Secondary | ICD-10-CM

## 2015-08-25 DIAGNOSIS — N182 Chronic kidney disease, stage 2 (mild): Secondary | ICD-10-CM | POA: Diagnosis not present

## 2015-08-25 DIAGNOSIS — G4700 Insomnia, unspecified: Secondary | ICD-10-CM | POA: Diagnosis not present

## 2015-08-25 DIAGNOSIS — I10 Essential (primary) hypertension: Secondary | ICD-10-CM

## 2015-08-25 NOTE — Assessment & Plan Note (Signed)
05/14/15 Na 128 05/28/15 Na 130 07/07/15 Na 131

## 2015-08-25 NOTE — Assessment & Plan Note (Signed)
Continue Oxycodone/Acetaminophine 7.5/325 qid, daily prn.

## 2015-08-25 NOTE — Assessment & Plan Note (Signed)
Stable, continue MiraLax 1/2 17gm +4 Oz fluid daily.

## 2015-08-25 NOTE — Assessment & Plan Note (Signed)
Stable, continue Omeprazole 20mg daily.  

## 2015-08-25 NOTE — Assessment & Plan Note (Signed)
Permissive blood pressure control, administer Atenolol 25mg , Cardura 4mg , Avapro 300mg  hs due to low Bp after am meds including Furosemide 40mg , Spironolactone 25mg .  Monitor BP, forehead pressure.

## 2015-08-25 NOTE — Assessment & Plan Note (Signed)
continue Synthroid 166mcg. 05/07/15 TSH 1.137

## 2015-08-25 NOTE — Assessment & Plan Note (Signed)
04/20/15 Bun/creatinine 23/1.14 07/07/15 Na 131, K 4.5, Bun 28, creat 1.0 Furosemide 40mg , Spironolactone 25mg  contributory

## 2015-08-25 NOTE — Assessment & Plan Note (Signed)
Trace BLE edema, requested from staff at [facility] for venous doppler 06/04/15 showed no evidence of bilateral lower extremity deep venous thrombosis, continue Furosemide 40mg , Spironolactone 25mg  daily. 07/07/15 Na 131, K 4.5, Bun 28, creat 1.0

## 2015-08-25 NOTE — Progress Notes (Signed)
Patient ID: Bailey [name], female   DOB: 24-Nov-1926, 82 y.o.   MRN: [id number]  Location:  [place] Room Number: AL 35 Place of Service: [place] Provider:  [facility] Teyana Pierron NP  GREEN, [name], MD  Patient Care Team: Estill Dooms, MD as PCP - General (Internal Medicine) Manav Pierotti X, NP as Nurse Practitioner (Nurse Practitioner)  Extended Emergency Contact Information Primary Emergency Contact: Gordy,[name] Address: [phone] [place]          [place], [place] [address] [name] of [place] Phone: [phone] Mobile Phone: [phone] Relation: Spouse Secondary Emergency Contact: Podoll,[name] Address: [address]          [place], [place] [address] [name] of [place] Mobile Phone: [phone] Relation: Son  Code Status:  DNR Goals of care: Advanced Directive information Advanced Directives 08/25/2015  Does patient have an advance directive? Yes  Type of Paramedic of [place];Living will;Out of facility DNR (pink MOST or yellow form)  Does patient want to make changes to advanced directive? No - Patient declined  Copy of advanced directive(s) in chart? Yes     Chief Complaint  Patient presents with  . Blood Pressure Check    Blood Pressure has been dropping, patient complains of nausea and clammy to touch    HPI:  Pt is a 82 y.o. female seen today for medical management of chronic diseases.  Noted BP 78/48, nausea, clammy after am Atenolol 25mg  qd, Cardura 4mg  qd, Avapro 300mg  daily. She takes Furosemide 40mg , Spironolactone 25mg  for edema. Hx of PE: chronic Eliquis. Post op anemia, takes Vit B12 daily, last Hgb 11.7 05/28/15. Last TSH 1.137 05/07/15, takes Levothyroxine 19mcg daily for hypothyroidism. Sleeps and rests with aid of Ambien 5mg  nightly.     Past Medical History  Diagnosis Date  . Osteoarthrosis, unspecified whether generalized or localized, forearm     bilaterally wrist  . Allergic  rhinitis, cause unspecified   . Senile osteoporosis   . Malignant neoplasm of breast (female), unspecified site 1990    left. Had surgerey and chemo, but no radiation  . Acquired cyst of kidney     left  . Left cavernous carotid aneurysm 08/2008    1-1/39mm aneurysm of cavernous carotid artery  . Anemia, unspecified   . Unspecified gastritis and gastroduodenitis without mention of hemorrhage   . 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 Other and unspecified nonspecific immunological findings     positive ANA  . History of Epstein-Barr virus infection   . Herpes simplex without mention of complication   . Insomnia, unspecified   . Unspecified hypothyroidism   . Hyposmolality and/or hyponatremia     07/13/15 Na 131  . Unspecified vitamin D deficiency   . Pure hypercholesterolemia   . Other B-complex deficiencies   . Osteoarthrosis of knee     bilaterally  . Closed fracture of unspecified part of vertebral column without mention of spinal cord injury     T7 s/p kyphoplasty, T12  . Unspecified essential hypertension   . Pain in joint, site unspecified     severe, diffuse, chronic pain  . Osteoarthrosis, hip   . Edema 2015  . CKD stage 2 due to type 2 diabetes mellitus ([place]) 11/18/2014  . Neuropathy ([place])   . Positive ANA (antinuclear antibody)   . Insomnia   . Pulmonary embolism ([place]) 05/07/2015   Past  Surgical History  Procedure Laterality Date  . Dilation and curettage of uterus  X 2  . Middle ear surgery Bilateral 1938  . Mastectomy Left 04/29/1989  . Tonsillectomy  1968  . Nasal sinus surgery  1990 & 2000  . Laparoscopic cholecystectomy  09/20/2006  . Orif hip fracture Left 06/13/2007    following a syncopal episode  . Kyphoplasty  07/03/2010    T12  . Kyphoplasty      C7  . Femur im nail Right 03/18/2015    Procedure: INTRAMEDULLARY (IM) NAIL FEMORAL;  Surgeon: Rod Can, MD;  Location: [facility];  Service: Orthopedics;   Laterality: Right;  . Fracture surgery      Allergies  Allergen Reactions  . Aspirin     Drop in body temp with large quantities, can tolerate low doses of aspirin  . Penicillins Swelling    Has patient had a PCN reaction causing immediate rash, facial/tongue/throat swelling, SOB or lightheadedness with hypotension: No Has patient had a PCN reaction causing severe rash involving mucus membranes or skin necrosis: No Has patient had a PCN reaction that required hospitalization No Has patient had a PCN reaction occurring within the last 10 years: No If all of the above answers are "NO", then may proceed with Cephalosporin use.      Medication List       This list is accurate as of: 08/25/15  2:40 PM.  Always use your most recent med list.               apixaban 5 MG Tabs tablet  Commonly known as:  ELIQUIS  Take 1 tablet (5 mg total) by mouth 2 (two) times daily.     atenolol 25 MG tablet  Commonly known as:  TENORMIN  Take one tablet by mouth once daily for blood pressure     cholecalciferol 1000 units tablet  Commonly known as:  VITAMIN D  Take 3,000 Units by mouth daily.     denosumab 60 MG/ML Soln injection  Commonly known as:  PROLIA  Inject 60 mg into the skin every 6 (six) months. Reported on 07/21/2015     doxazosin 4 MG tablet  Commonly known as:  CARDURA  One daily to help control BP     feeding supplement Liqd  Take 1 Container by mouth 2 (two) times daily between meals.     furosemide 40 MG tablet  Commonly known as:  LASIX  One daily to help control edema     irbesartan 300 MG tablet  Commonly known as:  AVAPRO  One daily to control BP     levothyroxine 112 MCG tablet  Commonly known as:  SYNTHROID, LEVOTHROID  Take one tablet by mouth once daily for thyroid supplement     Lidocaine 4 % Ptch  Commonly known as:  ASPERCREME LIDOCAINE  Apply 3 patches topically once. Apply 1 patch to the left hip, left knee, and the right shoulder in the morning.  Remove in the evening.     loratadine 10 MG tablet  Commonly known as:  CLARITIN  Take 10 mg by mouth daily.     omeprazole 20 MG capsule  Commonly known as:  PRILOSEC  Take 1 capsule (20 mg total) by mouth daily.     oxyCODONE-acetaminophen 7.5-325 MG tablet  Commonly known as:  PERCOCET  Take one tablet at 10 AM, 1 PM, 8 PM, and 10 PM for pain control. May take an extra tablet between midnight and 6  AM if needed for pain control.     prochlorperazine 10 MG tablet  Commonly known as:  COMPAZINE  Take 1 tablet (10 mg total) by mouth every 6 (six) hours as needed for nausea or vomiting.     senna-docusate 8.6-50 MG tablet  Commonly known as:  SENOKOT S  2 tablets nightly to prevent constipation     spironolactone 25 MG tablet  Commonly known as:  ALDACTONE  One each morning to reduce edema and to strengthen the heart     VITAMIN B-12 CR PO  Take 1 tablet by mouth daily.     zolpidem 5 MG tablet  Commonly known as:  AMBIEN  Take one tablet by mouth at bedtime for sleep        Review of Systems  Constitutional: Negative for fever, chills and diaphoresis.  HENT: Negative for congestion, ear discharge, ear pain, hearing loss, nosebleeds and tinnitus.        Sinus allergies  Eyes: Negative for photophobia and pain.  Respiratory: Positive for shortness of breath (On exertion). Negative for cough and wheezing.   Cardiovascular: Positive for leg swelling. Negative for chest pain and palpitations.       Reported BLE pain and edema. Venous doppler 06/04/15 showed no evidence of bilateral lower extremity deep venous thrombosis  Gastrointestinal: Positive for constipation. Negative for nausea, vomiting and abdominal pain.       Flatulence. Hemorrhoids.  Genitourinary: Negative.  Negative for dysuria, urgency and frequency.  Musculoskeletal: Positive for back pain and gait problem (using walker).       History of osteoporosis. Pain in the left knee and both hips.  Skin: Negative.   Negative for rash.       Right hip surgical incision healed  Neurological: Negative for dizziness, tremors and headaches.  Psychiatric/Behavioral: Negative for suicidal ideas and hallucinations. The patient is not nervous/anxious.     Immunization History  Administered Date(s) Administered  . Influenza Split 02/13/2013  . Influenza-Unspecified 02/27/2014, 02/12/2015  . Pneumococcal Polysaccharide-23 02/13/2013   Pertinent  Health Maintenance Due  Topic Date Due  . HEMOGLOBIN A1C  1926-07-09  . FOOT EXAM  03/21/1939  . OPHTHALMOLOGY EXAM  03/21/1937  . PNA vac Low Risk Adult (2 of 2 - PCV13) 02/13/2014  . INFLUENZA VACCINE  12/15/2015  . DEXA SCAN  Completed   Fall Risk  06/30/2015 03/30/2015 11/18/2014 07/22/2014 10/22/2013  Falls in the past year? No Yes Yes No No  Number falls in past yr: - 2 or more 1 - -  Injury with Fall? - Yes No - -  Risk Factor Category  - High Fall Risk - - -  Risk for fall due to : - History of fall(s);Impaired balance/gait;Impaired mobility History of fall(s);Impaired balance/gait - -  Follow up - Falls evaluation completed Falls prevention discussed - -   Functional Status Survey:    Filed Vitals:   08/25/15 1210  BP: 103/62  Pulse: 60  Temp: 97.7 F (36.5 C)  TempSrc: Oral  Resp: 18  Height: 5\' 5"  (1.651 m)  Weight: 128 lb 9.6 oz (58.333 kg)   Body mass index is 21.4 kg/(m^2). Physical Exam  Constitutional: She is oriented to person, place, and time. No distress.  Elderly. Frail. Slow in movement due to back pain.  HENT:  Right Ear: External ear normal.  Left Ear: External ear normal.  Nose: Nose normal.  Mouth/Throat: Oropharynx is clear and moist. No oropharyngeal exudate.  Eyes: Conjunctivae and EOM are  normal. Pupils are equal, round, and reactive to light.  Neck: No JVD present. No tracheal deviation present. No thyromegaly present.  Cardiovascular: Normal rate, regular rhythm, normal heart sounds and intact distal pulses.  Exam  reveals no gallop and no friction rub.   No murmur heard. Pulmonary/Chest: No respiratory distress. She has no wheezes. She has no rales. She exhibits no tenderness.  Abdominal: She exhibits no distension and no mass. There is no tenderness.  Musculoskeletal: Normal range of motion. She exhibits edema and tenderness (Lumbar area and left knee).  Right hip pain with movement, BLE edema 1+, mainly in ankles and feet. Unstable on standing. Uses walker to maintain balance.  Lymphadenopathy:    She has no cervical adenopathy.  Neurological: She is alert and oriented to person, place, and time. She has normal reflexes.  10/22/2013 MMSE 30/30. Passed clock drawing  Skin: No rash noted. No erythema. No pallor.  Right hip surgical incision healed.  Psychiatric: She has a normal mood and affect. Her behavior is normal. Judgment and thought content normal.    Labs reviewed:  Recent Labs  03/19/15 0415  03/23/15 0423  05/07/15 1209 05/08/15 0615 05/14/15 05/28/15 07/13/15  NA 135  < > 135  < > 132* 136 128* 130* 131*  K 4.2  < > 3.5  < > 4.7 4.0 4.3 4.1 4.5  CL 107  < > 105  --  100* 107  --   --   --   CO2 22  < > 24  --  22 21*  --   --   --   GLUCOSE 170*  < > 104*  --  106* 91  --   --   --   BUN 24*  < > 17  < > 22* 20 24* 25* 28*  CREATININE 1.05*  < > 1.16*  < > 1.05* 1.04* 1.0 0.9 1.0  CALCIUM 7.9*  < > 7.3*  --  8.8* 7.9*  --   --   --   MG 2.0  --   --   --   --   --   --   --   --   < > = values in this interval not displayed.  Recent Labs  03/26/15 04/10/15 05/14/15  AST 15 14 12*  ALT 11 8 8   ALKPHOS 41 95 62    Recent Labs  03/18/15 0237  05/07/15 1209 05/08/15 0615 05/09/15 0335 05/14/15 05/28/15  WBC 9.9  < > 7.7 4.8 5.2 6.1 4.6  NEUTROABS 8.0*  --   --   --   --   --   --   HGB 11.8*  < > 12.2 11.2* 12.2 12.9 11.7*  HCT 34.1*  < > 35.5* 33.1* 35.7* 38 35*  MCV 92.9  < > 96.7 97.4 97.0  --   --   PLT 165  < > 175 145* 175 181 138*  < > = values in this  interval not displayed. Lab Results  Component Value Date   TSH 1.137 05/07/2015   No results found for: HGBA1C Lab Results  Component Value Date   CHOL 175 11/10/2014   HDL 53 11/10/2014   LDLCALC 87 11/10/2014   TRIG 173* 11/10/2014    Significant Diagnostic Results in last 30 days:  No results found.  Assessment/Plan  Chronic pain syndrome Continue Oxycodone/Acetaminophine 7.5/325 qid, daily prn.   CKD (chronic kidney disease) stage 2, GFR 60-89 ml/min 04/20/15 Bun/creatinine 23/1.14 07/07/15  Na 131, K 4.5, Bun 28, creat 1.0 Furosemide 40mg , Spironolactone 25mg  contributory  Constipation Stable, continue MiraLax 1/2 17gm +4 Oz fluid daily.   Edema Trace BLE edema, requested from staff at [facility] for venous doppler 06/04/15 showed no evidence of bilateral lower extremity deep venous thrombosis, continue Furosemide 40mg , Spironolactone 25mg  daily. 07/07/15 Na 131, K 4.5, Bun 28, creat 1.0    Essential hypertension Permissive blood pressure control, administer Atenolol 25mg , Cardura 4mg , Avapro 300mg  hs due to low Bp after am meds including Furosemide 40mg , Spironolactone 25mg .  Monitor BP, forehead pressure.   GERD (gastroesophageal reflux disease) Stable, continue Omeprazole 20mg  daily.   Hyponatremia 05/14/15 Na 128 05/28/15 Na 130 07/07/15 Na 131   Hypothyroidism continue Synthroid 150mcg. 05/07/15 TSH 1.137   Insomnia Stable, continue Zolpidem 5mg  nightly.     Family/ staff Communication: continue AL for care needs.   Labs/tests ordered: none

## 2015-08-25 NOTE — Assessment & Plan Note (Signed)
Stable, continue Zolpidem 5mg  nightly.

## 2015-08-27 ENCOUNTER — Telehealth: Payer: Self-pay

## 2015-08-27 NOTE — Telephone Encounter (Signed)
I will attend to this when I am at [facility] next Tuesday.

## 2015-08-27 NOTE — Telephone Encounter (Signed)
Patient's son called and asked if his mother could possibly be given an anti-inflammatory medication so that she did not have to take as much pain medication for her osteoarthritis. He would also like to speak with Dr. Nyoka Cowden about getting a referral to a rheumatologist for his mother.   Please advise.

## 2015-09-01 ENCOUNTER — Encounter: Payer: Self-pay | Admitting: Internal Medicine

## 2015-09-02 ENCOUNTER — Other Ambulatory Visit: Payer: Self-pay | Admitting: *Deleted

## 2015-09-02 DIAGNOSIS — G894 Chronic pain syndrome: Secondary | ICD-10-CM

## 2015-09-02 MED ORDER — OXYCODONE-ACETAMINOPHEN 7.5-325 MG PO TABS: ORAL_TABLET | ORAL | Status: DC

## 2015-09-05 ENCOUNTER — Inpatient Hospital Stay (HOSPITAL_COMMUNITY)
Admission: EM | Admit: 2015-09-05 | Discharge: 2015-09-09 | DRG: 683 | Disposition: A | Discharge status: Skilled Nursing Facility | Payer: Medicare Other | Attending: Internal Medicine | Admitting: Internal Medicine

## 2015-09-05 ENCOUNTER — Emergency Department (HOSPITAL_COMMUNITY): Payer: Medicare Other

## 2015-09-05 ENCOUNTER — Encounter (HOSPITAL_COMMUNITY): Payer: Self-pay | Admitting: *Deleted

## 2015-09-05 DIAGNOSIS — I959 Hypotension, unspecified: Secondary | ICD-10-CM | POA: Diagnosis not present

## 2015-09-05 DIAGNOSIS — R42 Dizziness and giddiness: Secondary | ICD-10-CM

## 2015-09-05 DIAGNOSIS — E785 Hyperlipidemia, unspecified: Secondary | ICD-10-CM | POA: Diagnosis present

## 2015-09-05 DIAGNOSIS — K5909 Other constipation: Secondary | ICD-10-CM | POA: Diagnosis present

## 2015-09-05 DIAGNOSIS — E7800 Pure hypercholesterolemia, unspecified: Secondary | ICD-10-CM | POA: Diagnosis present

## 2015-09-05 DIAGNOSIS — E039 Hypothyroidism, unspecified: Secondary | ICD-10-CM | POA: Diagnosis present

## 2015-09-05 DIAGNOSIS — N289 Disorder of kidney and ureter, unspecified: Secondary | ICD-10-CM | POA: Diagnosis not present

## 2015-09-05 DIAGNOSIS — G894 Chronic pain syndrome: Secondary | ICD-10-CM

## 2015-09-05 DIAGNOSIS — K219 Gastro-esophageal reflux disease without esophagitis: Secondary | ICD-10-CM | POA: Diagnosis present

## 2015-09-05 DIAGNOSIS — R55 Syncope and collapse: Secondary | ICD-10-CM

## 2015-09-05 DIAGNOSIS — Z7901 Long term (current) use of anticoagulants: Secondary | ICD-10-CM

## 2015-09-05 DIAGNOSIS — E875 Hyperkalemia: Secondary | ICD-10-CM

## 2015-09-05 DIAGNOSIS — R531 Weakness: Secondary | ICD-10-CM | POA: Insufficient documentation

## 2015-09-05 DIAGNOSIS — R404 Transient alteration of awareness: Secondary | ICD-10-CM | POA: Diagnosis not present

## 2015-09-05 DIAGNOSIS — E860 Dehydration: Secondary | ICD-10-CM | POA: Diagnosis not present

## 2015-09-05 DIAGNOSIS — E871 Hypo-osmolality and hyponatremia: Secondary | ICD-10-CM | POA: Diagnosis not present

## 2015-09-05 DIAGNOSIS — N179 Acute kidney failure, unspecified: Secondary | ICD-10-CM | POA: Diagnosis not present

## 2015-09-05 DIAGNOSIS — I129 Hypertensive chronic kidney disease with stage 1 through stage 4 chronic kidney disease, or unspecified chronic kidney disease: Secondary | ICD-10-CM | POA: Diagnosis present

## 2015-09-05 DIAGNOSIS — M810 Age-related osteoporosis without current pathological fracture: Secondary | ICD-10-CM

## 2015-09-05 DIAGNOSIS — Z9012 Acquired absence of left breast and nipple: Secondary | ICD-10-CM

## 2015-09-05 DIAGNOSIS — R1310 Dysphagia, unspecified: Secondary | ICD-10-CM | POA: Diagnosis not present

## 2015-09-05 DIAGNOSIS — Z66 Do not resuscitate: Secondary | ICD-10-CM | POA: Diagnosis present

## 2015-09-05 DIAGNOSIS — N182 Chronic kidney disease, stage 2 (mild): Secondary | ICD-10-CM | POA: Diagnosis present

## 2015-09-05 DIAGNOSIS — K5900 Constipation, unspecified: Secondary | ICD-10-CM | POA: Diagnosis present

## 2015-09-05 DIAGNOSIS — T502X5A Adverse effect of carbonic-anhydrase inhibitors, benzothiadiazides and other diuretics, initial encounter: Secondary | ICD-10-CM | POA: Diagnosis present

## 2015-09-05 DIAGNOSIS — I951 Orthostatic hypotension: Secondary | ICD-10-CM | POA: Diagnosis present

## 2015-09-05 DIAGNOSIS — G4700 Insomnia, unspecified: Secondary | ICD-10-CM

## 2015-09-05 DIAGNOSIS — R0602 Shortness of breath: Secondary | ICD-10-CM | POA: Diagnosis not present

## 2015-09-05 DIAGNOSIS — I2699 Other pulmonary embolism without acute cor pulmonale: Secondary | ICD-10-CM | POA: Diagnosis present

## 2015-09-05 DIAGNOSIS — D638 Anemia in other chronic diseases classified elsewhere: Secondary | ICD-10-CM | POA: Diagnosis present

## 2015-09-05 DIAGNOSIS — J329 Chronic sinusitis, unspecified: Secondary | ICD-10-CM | POA: Diagnosis present

## 2015-09-05 DIAGNOSIS — Z86711 Personal history of pulmonary embolism: Secondary | ICD-10-CM

## 2015-09-05 DIAGNOSIS — Z79899 Other long term (current) drug therapy: Secondary | ICD-10-CM

## 2015-09-05 DIAGNOSIS — N183 Hypertensive chronic kidney disease with stage 1 through stage 4 chronic kidney disease, or unspecified chronic kidney disease: Secondary | ICD-10-CM | POA: Diagnosis present

## 2015-09-05 HISTORY — DX: Hypo-osmolality and hyponatremia: E87.1

## 2015-09-05 LAB — CBC WITH DIFFERENTIAL/PLATELET
Basophils Absolute: 0 10*3/uL (ref 0.0–0.1)
Basophils Relative: 0 %
Eosinophils Absolute: 0.1 10*3/uL (ref 0.0–0.7)
Eosinophils Relative: 1 %
HEMATOCRIT: 29.8 % — AB (ref 36.0–46.0)
HEMOGLOBIN: 10.7 g/dL — AB (ref 12.0–15.0)
LYMPHS ABS: 1 10*3/uL (ref 0.7–4.0)
Lymphocytes Relative: 14 %
MCH: 32.8 pg (ref 26.0–34.0)
MCHC: 35.9 g/dL (ref 30.0–36.0)
MCV: 91.4 fL (ref 78.0–100.0)
MONOS PCT: 7 %
Monocytes Absolute: 0.4 10*3/uL (ref 0.1–1.0)
NEUTROS ABS: 5.2 10*3/uL (ref 1.7–7.7)
NEUTROS PCT: 78 %
Platelets: 150 10*3/uL (ref 150–400)
RBC: 3.26 MIL/uL — ABNORMAL LOW (ref 3.87–5.11)
RDW: 12 % (ref 11.5–15.5)
WBC: 6.7 10*3/uL (ref 4.0–10.5)

## 2015-09-05 LAB — COMPREHENSIVE METABOLIC PANEL
ALT: 14 U/L (ref 14–54)
AST: 19 U/L (ref 15–41)
Albumin: 3.7 g/dL (ref 3.5–5.0)
Alkaline Phosphatase: 54 U/L (ref 38–126)
Anion gap: 7 (ref 5–15)
BUN: 43 mg/dL — AB (ref 6–20)
CHLORIDE: 92 mmol/L — AB (ref 101–111)
CO2: 24 mmol/L (ref 22–32)
CREATININE: 1.44 mg/dL — AB (ref 0.44–1.00)
Calcium: 8.6 mg/dL — ABNORMAL LOW (ref 8.9–10.3)
GFR calc Af Amer: 37 mL/min — ABNORMAL LOW (ref >60)
GFR, EST NON AFRICAN AMERICAN: 32 mL/min — AB (ref >60)
Glucose, Bld: 86 mg/dL (ref 65–99)
Potassium: 5.2 mmol/L — ABNORMAL HIGH (ref 3.5–5.1)
SODIUM: 123 mmol/L — AB (ref 135–145)
Total Bilirubin: 0.9 mg/dL (ref 0.3–1.2)
Total Protein: 6.6 g/dL (ref 6.5–8.1)

## 2015-09-05 LAB — URINALYSIS, ROUTINE W REFLEX MICROSCOPIC
BILIRUBIN URINE: NEGATIVE
GLUCOSE, UA: NEGATIVE mg/dL
Hgb urine dipstick: NEGATIVE
Ketones, ur: NEGATIVE mg/dL
Leukocytes, UA: NEGATIVE
Nitrite: NEGATIVE
PH: 7.5 (ref 5.0–8.0)
Protein, ur: NEGATIVE mg/dL
SPECIFIC GRAVITY, URINE: 1.007 (ref 1.005–1.030)

## 2015-09-05 LAB — SODIUM, URINE, RANDOM: Sodium, Ur: 21 mmol/L

## 2015-09-05 LAB — IRON AND TIBC
Iron: 75 ug/dL (ref 28–170)
Saturation Ratios: 28 % (ref 10.4–31.8)
TIBC: 265 ug/dL (ref 250–450)
UIBC: 190 ug/dL

## 2015-09-05 LAB — OSMOLALITY, URINE: Osmolality, Ur: 217 mOsm/kg — ABNORMAL LOW (ref 300–900)

## 2015-09-05 LAB — MAGNESIUM: MAGNESIUM: 2.2 mg/dL (ref 1.7–2.4)

## 2015-09-05 LAB — BRAIN NATRIURETIC PEPTIDE: B Natriuretic Peptide: 76.3 pg/mL (ref 0.0–100.0)

## 2015-09-05 LAB — VITAMIN B12: VITAMIN B 12: 1380 pg/mL — AB (ref 180–914)

## 2015-09-05 LAB — RETICULOCYTES
RBC.: 3.22 MIL/uL — AB (ref 3.87–5.11)
RETIC CT PCT: 1.4 % (ref 0.4–3.1)
Retic Count, Absolute: 45.1 10*3/uL (ref 19.0–186.0)

## 2015-09-05 LAB — LIPASE, BLOOD: Lipase: 38 U/L (ref 11–51)

## 2015-09-05 LAB — OSMOLALITY: Osmolality: 273 mOsm/kg — ABNORMAL LOW (ref 275–295)

## 2015-09-05 LAB — CREATININE, URINE, RANDOM: CREATININE, URINE: 20 mg/dL

## 2015-09-05 LAB — FOLATE: Folate: 23.4 ng/mL (ref >5.9)

## 2015-09-05 LAB — TROPONIN I: Troponin I: <0.03 ng/mL (ref <0.031)

## 2015-09-05 LAB — FERRITIN: Ferritin: 124 ng/mL (ref 11–307)

## 2015-09-05 MED ORDER — DOXAZOSIN MESYLATE 4 MG PO TABS
4.0000 mg | ORAL_TABLET | Freq: Every day | ORAL | Status: DC
  Administered 2015-09-05 – 2015-09-08 (×4): 4 mg via ORAL
  Filled 2015-09-05 (×4): qty 1

## 2015-09-05 MED ORDER — SENNOSIDES-DOCUSATE SODIUM 8.6-50 MG PO TABS
2.0000 | ORAL_TABLET | Freq: Every day | ORAL | Status: DC
  Administered 2015-09-05 – 2015-09-08 (×4): 2 via ORAL
  Filled 2015-09-05 (×4): qty 2

## 2015-09-05 MED ORDER — SODIUM CHLORIDE 0.9 % IV BOLUS (SEPSIS)
500.0000 mL | Freq: Once | INTRAVENOUS | Status: AC
  Administered 2015-09-05: 500 mL via INTRAVENOUS

## 2015-09-05 MED ORDER — APIXABAN 5 MG PO TABS
5.0000 mg | ORAL_TABLET | Freq: Two times a day (BID) | ORAL | Status: DC
  Administered 2015-09-05 – 2015-09-09 (×8): 5 mg via ORAL
  Filled 2015-09-05 (×8): qty 1

## 2015-09-05 MED ORDER — SODIUM CHLORIDE 0.9 % IV SOLN
INTRAVENOUS | Status: DC
  Administered 2015-09-05 – 2015-09-06 (×3): via INTRAVENOUS
  Filled 2015-09-05 (×5): qty 1000

## 2015-09-05 MED ORDER — SORBITOL 70 % SOLN: 30.0000 mL | Freq: Every day | Status: DC | PRN

## 2015-09-05 MED ORDER — OXYCODONE-ACETAMINOPHEN 7.5-325 MG PO TABS
1.0000 | ORAL_TABLET | Freq: Four times a day (QID) | ORAL | Status: DC
Start: 2015-09-05 — End: 2015-09-09
  Administered 2015-09-05 – 2015-09-09 (×16): 1 via ORAL
  Filled 2015-09-05 (×16): qty 1

## 2015-09-05 MED ORDER — LORATADINE 10 MG PO TABS
10.0000 mg | ORAL_TABLET | Freq: Every day | ORAL | Status: DC
  Administered 2015-09-05 – 2015-09-09 (×5): 10 mg via ORAL
  Filled 2015-09-05 (×5): qty 1

## 2015-09-05 MED ORDER — ONDANSETRON HCL 4 MG PO TABS: 4.0000 mg | ORAL_TABLET | Freq: Four times a day (QID) | ORAL | Status: DC | PRN

## 2015-09-05 MED ORDER — LIDOCAINE 5 % EX PTCH
3.0000 | MEDICATED_PATCH | Freq: Once | CUTANEOUS | Status: DC
  Administered 2015-09-05: 3 via TRANSDERMAL
  Filled 2015-09-05: qty 3

## 2015-09-05 MED ORDER — ZOLPIDEM TARTRATE 5 MG PO TABS
5.0000 mg | ORAL_TABLET | Freq: Every evening | ORAL | Status: DC | PRN
  Administered 2015-09-06 – 2015-09-08 (×3): 5 mg via ORAL
  Filled 2015-09-05 (×3): qty 1

## 2015-09-05 MED ORDER — ONDANSETRON HCL 4 MG/2ML IJ SOLN: 4.0000 mg | Freq: Four times a day (QID) | INTRAMUSCULAR | Status: DC | PRN

## 2015-09-05 MED ORDER — ACETAMINOPHEN 325 MG PO TABS
650.0000 mg | ORAL_TABLET | ORAL | Status: DC | PRN
  Administered 2015-09-05 – 2015-09-09 (×4): 650 mg via ORAL
  Filled 2015-09-05 (×4): qty 2

## 2015-09-05 MED ORDER — SODIUM CHLORIDE 0.9% FLUSH
3.0000 mL | Freq: Two times a day (BID) | INTRAVENOUS | Status: DC
  Administered 2015-09-07 – 2015-09-08 (×3): 3 mL via INTRAVENOUS

## 2015-09-05 MED ORDER — BOOST / RESOURCE BREEZE PO LIQD
1.0000 | Freq: Every day | ORAL | Status: DC
  Administered 2015-09-05 – 2015-09-08 (×4): 1 via ORAL

## 2015-09-05 MED ORDER — LEVOTHYROXINE SODIUM 112 MCG PO TABS
112.0000 ug | ORAL_TABLET | Freq: Every day | ORAL | Status: DC
  Administered 2015-09-06 – 2015-09-09 (×4): 112 ug via ORAL
  Filled 2015-09-05 (×4): qty 1

## 2015-09-05 MED ORDER — HYDRALAZINE HCL 20 MG/ML IJ SOLN: 5.0000 mg | Freq: Four times a day (QID) | INTRAMUSCULAR | Status: DC | PRN

## 2015-09-05 MED ORDER — VITAMIN D 1000 UNITS PO TABS
3000.0000 [IU] | ORAL_TABLET | Freq: Every day | ORAL | Status: DC
  Administered 2015-09-05 – 2015-09-09 (×5): 3000 [IU] via ORAL
  Filled 2015-09-05 (×5): qty 3

## 2015-09-05 MED ORDER — PANTOPRAZOLE SODIUM 40 MG PO TBEC
40.0000 mg | DELAYED_RELEASE_TABLET | Freq: Every day | ORAL | Status: DC
  Administered 2015-09-05 – 2015-09-09 (×5): 40 mg via ORAL
  Filled 2015-09-05 (×5): qty 1

## 2015-09-05 MED ORDER — SODIUM CHLORIDE 0.9 % IV BOLUS (SEPSIS)
250.0000 mL | Freq: Once | INTRAVENOUS | Status: AC
  Administered 2015-09-05: 250 mL via INTRAVENOUS

## 2015-09-05 MED ORDER — MORPHINE SULFATE (PF) 2 MG/ML IV SOLN
2.0000 mg | INTRAVENOUS | Status: DC | PRN
  Administered 2015-09-08: 2 mg via INTRAVENOUS
  Filled 2015-09-05: qty 1

## 2015-09-05 MED ORDER — HYDROCODONE-HOMATROPINE 5-1.5 MG/5ML PO SYRP: 5.0000 mL | ORAL_SOLUTION | Freq: Four times a day (QID) | ORAL | Status: DC | PRN

## 2015-09-05 MED ORDER — VITAMIN B-12 1000 MCG PO TABS
1000.0000 ug | ORAL_TABLET | Freq: Every day | ORAL | Status: DC
  Administered 2015-09-05 – 2015-09-09 (×5): 1000 ug via ORAL
  Filled 2015-09-05 (×5): qty 1

## 2015-09-05 NOTE — ED Notes (Signed)
Pt attempt to urinate, hat in toilet and pt tipped hat over prior to sample collection. Unable to void at the time.

## 2015-09-05 NOTE — ED Notes (Signed)
Per [facility] pt orthostatic changes. Pt given 500 ml bolus and 4 mg of zofran IV en route with EMS.

## 2015-09-05 NOTE — ED Notes (Signed)
Patient transported to X-ray 

## 2015-09-05 NOTE — ED Notes (Signed)
RN getting vitals, then taking pt to restroom.  Will notify me when done so labs can get drawn.

## 2015-09-05 NOTE — ED Notes (Signed)
Bed: WA08 Expected date:  Expected time:  Means of arrival:  Comments: EMS- 82yo orthostatic hypotension

## 2015-09-05 NOTE — ED Provider Notes (Signed)
CSN: [id number]     Arrival date & time 09/05/15  1105 History   First MD Initiated Contact with Patient 09/05/15 1110     Chief Complaint  Patient presents with  . Hypotension     (Consider location/radiation/quality/duration/timing/severity/associated sxs/prior Treatment) HPI Patient reports that she was very dizzy this morning. She was going to the bathroom and nearly passed out. She does report she has also felt very fatigued and had low energy for several weeks. She states if she gets up to try to walk across a room, she often needs to sit down and rest due to feelings of fatigue and dizziness. She reports she has had increased cough for several weeks. No chest pain and no shortness of breath. Patient does take diuretics and blood pressure medications. She is unsure if there have been any dose changes because her medications are  administered to her. No known fevers. She reports typically she might run a low temperature. At baseline patient reports that she used to be very energetic and active. She reports she has become more fatigued and not do the same activities she could previously. She reports she chronically has a lot of joint related pain but otherwise does not complain of abdominal pain or new pain. Past Medical History  Diagnosis Date  . Osteoarthrosis, unspecified whether generalized or localized, forearm     bilaterally wrist  . Allergic rhinitis, cause unspecified   . Senile osteoporosis   . Malignant neoplasm of breast (female), unspecified site 1990    left. Had surgerey and chemo, but no radiation  . Acquired cyst of kidney     left  . Left cavernous carotid aneurysm 08/2008    1-1/53mm aneurysm of cavernous carotid artery  . Anemia, unspecified   . Unspecified gastritis and gastroduodenitis without mention of hemorrhage   . 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 Other and unspecified nonspecific immunological findings    positive ANA  . History of Epstein-Barr virus infection   . Herpes simplex without mention of complication   . Insomnia, unspecified   . Unspecified hypothyroidism   . Hyposmolality and/or hyponatremia     07/13/15 Na 131  . Unspecified vitamin D deficiency   . Pure hypercholesterolemia   . Other B-complex deficiencies   . Osteoarthrosis of knee     bilaterally  . Closed fracture of unspecified part of vertebral column without mention of spinal cord injury     T7 s/p kyphoplasty, T12  . Unspecified essential hypertension   . Pain in joint, site unspecified     severe, diffuse, chronic pain  . Osteoarthrosis, hip   . Edema 2015  . CKD stage 2 due to type 2 diabetes mellitus ([place]) 11/18/2014  . Neuropathy ([place])   . Positive ANA (antinuclear antibody)   . Insomnia   . Pulmonary embolism ([place]) 05/07/2015   Past Surgical History  Procedure Laterality Date  . Dilation and curettage of uterus  X 2  . Middle ear surgery Bilateral 1938  . Mastectomy Left 04/29/1989  . Tonsillectomy  1968  . Nasal sinus surgery  1990 & 2000  . Laparoscopic cholecystectomy  09/20/2006  . Orif hip fracture Left 06/13/2007    following a syncopal episode  . Kyphoplasty  07/03/2010    T12  . Kyphoplasty      C7  . Femur im nail Right 03/18/2015    Procedure: INTRAMEDULLARY (IM) NAIL FEMORAL;  Surgeon: Aaron Edelman  Swinteck, MD;  Location: [facility];  Service: Orthopedics;  Laterality: Right;  . Fracture surgery     No family history on file. Social History  Substance Use Topics  . Smoking status: Never Smoker   . Smokeless tobacco: Never Used  . Alcohol Use: Yes     Comment: 05/08/2015 "I'll have a glass of white wine q now and then"   OB History    No data available     Review of Systems 10 Systems reviewed and are negative for acute change except as noted in the HPI.    Allergies  Aspirin and Penicillins  Home Medications   Prior to Admission medications   Medication Sig Start Date End Date  Taking? Authorizing Provider  apixaban (ELIQUIS) 5 MG TABS tablet Take 1 tablet (5 mg total) by mouth 2 (two) times daily. 05/15/15  Yes Thurnell Lose, MD  atenolol (TENORMIN) 25 MG tablet Take one tablet by mouth once daily for blood pressure 02/12/15  Yes Lauree Chandler, NP  cholecalciferol (VITAMIN D) 1000 UNITS tablet Take 3,000 Units by mouth daily.   Yes Historical Provider, MD  Cyanocobalamin (VITAMIN B-12 CR PO) Take 1 tablet by mouth daily.    Yes Historical Provider, MD  denosumab (PROLIA) 60 MG/ML SOLN injection Inject 60 mg into the skin every 6 (six) months. Reported on 07/21/2015   Yes Historical Provider, MD  doxazosin (CARDURA) 4 MG tablet One daily to help control BP 06/30/15  Yes Estill Dooms, MD  feeding supplement (BOOST / RESOURCE BREEZE) LIQD Take 1 Container by mouth daily with supper.    Yes Historical Provider, MD  furosemide (LASIX) 40 MG tablet One daily to help control edema 06/30/15  Yes Estill Dooms, MD  irbesartan (AVAPRO) 300 MG tablet One daily to control BP 05/07/15  Yes Estill Dooms, MD  levothyroxine (SYNTHROID, LEVOTHROID) 112 MCG tablet Take one tablet by mouth once daily for thyroid supplement 03/26/14  Yes Estill Dooms, MD  Lidocaine (ASPERCREME LIDOCAINE) 4 % PTCH Apply 3 patches topically once. Apply 1 patch to the left hip, left knee, and the right shoulder in the morning. Remove in the evening. 05/14/15  Yes Estill Dooms, MD  loratadine (CLARITIN) 10 MG tablet Take 10 mg by mouth daily.   Yes Historical Provider, MD  omeprazole (PRILOSEC) 20 MG capsule Take 1 capsule (20 mg total) by mouth daily. 05/07/15  Yes Estill Dooms, MD  oxyCODONE-acetaminophen (PERCOCET) 7.5-325 MG tablet Take one tablet at 10 AM, 1 PM, 8 PM, and 10 PM for pain control. May take an extra tablet between midnight and 6 AM if needed for pain control. 09/02/15  Yes Estill Dooms, MD  prochlorperazine (COMPAZINE) 10 MG tablet Take 1 tablet (10 mg total) by mouth every 6 (six)  hours as needed for nausea or vomiting. 03/23/15  Yes Eugenie Filler, MD  senna-docusate (SENOKOT S) 8.6-50 MG tablet 2 tablets nightly to prevent constipation 06/30/15  Yes Estill Dooms, MD  spironolactone (ALDACTONE) 25 MG tablet One each morning to reduce edema and to strengthen the heart 07/21/15  Yes Estill Dooms, MD  zolpidem (AMBIEN) 5 MG tablet Take one tablet by mouth at bedtime for sleep 07/22/15  Yes Estill Dooms, MD   BP 156/74 mmHg  Pulse 53  Temp(Src) 98.1 F (36.7 C) (Oral)  Resp 14  SpO2 100% Physical Exam  Constitutional: She is oriented to person, place, and time. She appears well-developed  and well-nourished.  HENT:  Head: Normocephalic and atraumatic.  Eyes: EOM are normal. Pupils are equal, round, and reactive to light.  Neck: Neck supple.  Cardiovascular: Normal rate, regular rhythm, normal heart sounds and intact distal pulses.   Pulmonary/Chest: Effort normal and breath sounds normal.  Abdominal: Soft. Bowel sounds are normal. She exhibits no distension. There is no tenderness.  Musculoskeletal: Normal range of motion. She exhibits no edema or tenderness.  Neurological: She is alert and oriented to person, place, and time. She has normal strength. No cranial nerve deficit. She exhibits normal muscle tone. Coordination normal. GCS eye subscore is 4. GCS verbal subscore is 5. GCS motor subscore is 6.  Skin: Skin is warm, dry and intact.  Psychiatric: She has a normal mood and affect.    ED Course  Procedures (including critical care time) Labs Review Labs Reviewed  COMPREHENSIVE METABOLIC PANEL - Abnormal; Notable for the following:    Sodium 123 (*)    Potassium 5.2 (*)    Chloride 92 (*)    BUN 43 (*)    Creatinine, Ser 1.44 (*)    Calcium 8.6 (*)    GFR calc non Af Amer 32 (*)    GFR calc Af Amer 37 (*)    All other components within normal limits  CBC WITH DIFFERENTIAL/PLATELET - Abnormal; Notable for the following:    RBC 3.26 (*)    Hemoglobin  10.7 (*)    HCT 29.8 (*)    All other components within normal limits  LIPASE, BLOOD  TROPONIN I  BRAIN NATRIURETIC PEPTIDE  URINALYSIS, ROUTINE W REFLEX MICROSCOPIC (NOT AT [facility])  SODIUM, URINE, RANDOM  CREATININE, URINE, RANDOM  OSMOLALITY  OSMOLALITY, URINE  MAGNESIUM  VITAMIN B12  FOLATE  IRON AND TIBC  FERRITIN  RETICULOCYTES    Imaging Review Dg Chest 2 View  09/05/2015  CLINICAL DATA:  Dyspnea, hypotension history of pulmonary emboli EXAM: CHEST  2 VIEW COMPARISON:  CT 05/07/2015 FINDINGS: Normal cardiac silhouette. Lungs are hyperinflated. No effusion, infiltrate or pneumothorax. There is compression fractures in the mid thoracic spine. There vertebroplasty augmentation in mid thoracic spinal or thoracic spine IMPRESSION: Hyperinflated lungs with no acute findings Chronic compression fractures in the thoracic spine Electronically Signed   By: Suzy Bouchard M.D.   On: 09/05/2015 13:01   I have personally reviewed and evaluated these images and lab results as part of my medical decision-making.   EKG Interpretation   Date/Time:  Saturday September 05 2015 12:50:00 EDT Ventricular Rate:  53 PR Interval:  213 QRS Duration: 106 QT Interval:  487 QTC Calculation: 457 R Axis:   0 Text Interpretation:  Sinus rhythm Borderline prolonged PR interval  Borderline T abnormalities, anterior leads agree. no change from previous  Confirmed by Johnney Killian, MD, Jeannie Done [phone]) on 09/05/2015 12:59:57 PM     Consult: [facility] hospitalist for admission. MDM   Final diagnoses:  Near syncope  Weakness generalized  Hyponatremia  Renal insufficiency  Hypotension, unspecified hypotension type   Patient presents with increased general weakness and near syncope this morning. Not had fever or focal signs of infectious illness. She did have hypotension about 2 weeks ago. Patient however appears to be on the same diuretics and anti-hypertensive agents. She has become gradually more hyponatremic. I  suspect hyponatremia with over diuresis are the etiology of the patient's symptoms. At this time she will be admitted to the hospital for ongoing treatment and monitoring.    Charlesetta Shanks, MD 09/05/15 [phone]

## 2015-09-05 NOTE — H&P (Signed)
History and Physical    Bailey [name] [id number] DOB: 06-Mar-1927 DOA: 09/05/2015  Referring MD/NP/PA: Dr Johnney Killian PCP: Estill Dooms, MD  Outpatient Specialists:  [facility]  Chief Complaint: Dizziness  HPI: Bailey [name] is a 82 y.o. female with medical history significant of osteoporosis, hypothyroidism, chronic hyponatremia with baseline sodium approximately 1:30 to 131, vitamin D deficiency, hyperlipidemia, hypertension, history of PE on chronic anticoagulation, chronic kidney disease stage II who presents to the ED after dizziness and a presyncopal episode. Patient states that she got up, dressed was washing her hands when she 7 felt dizzy and made her way to a chair and sat down. Patient stated that she felt like she nearly passed out. Patient did have some associated shortness of breath. Patient denies any chest pain, no palpitations, no fever, no abdominal pain, no dysuria, no diarrhea, no constipation, no melena, no hematemesis, no hematochezia. Patient does endorse a headache about 4 to a 5 out of 10, fatigue and generalized weakness for a few weeks, some nausea, an episode of emesis one day prior to admission which has since resolved. Patient does endorse some cough which she attributes to her allergies. Patient stated she called the nurse at the nursing facility and initially systolic blood pressure was 50 and then when EMS got the systolic blood pressure was 80 patient was given a bolus of IV fluids and patient brought to the ED.  ED Course: Patient seen in the ED was given a bolus of IV fluids. Compressive metabolic profile had a sodium of 123 potassium of 5.2 chloride of 92 BUN of 43 creatinine of 1.44 otherwise was within normal limits. Troponin was negative. BNP was 76.3. CBC had a hemoglobin of 10.7 otherwise was within normal limits. Urinalysis was unremarkable. Chest x-ray was negative for any acute infiltrate. EKG with normal sinus rhythm  with prolonged PR interval. Orthostasis were checked and patient was noted to be orthostatic. [facility] were called to admit the patient for further evaluation and management.  Review of Systems: As per HPI otherwise 10 point review of systems negative.   Past Medical History  Diagnosis Date  . Osteoarthrosis, unspecified whether generalized or localized, forearm     bilaterally wrist  . Allergic rhinitis, cause unspecified   . Senile osteoporosis   . Malignant neoplasm of breast (female), unspecified site 1990    left. Had surgerey and chemo, but no radiation  . Acquired cyst of kidney     left  . Left cavernous carotid aneurysm 08/2008    1-1/46mm aneurysm of cavernous carotid artery  . Anemia, unspecified   . Unspecified gastritis and gastroduodenitis without mention of hemorrhage   . 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 Other and unspecified nonspecific immunological findings     positive ANA  . History of Epstein-Barr virus infection   . Herpes simplex without mention of complication   . Insomnia, unspecified   . Unspecified hypothyroidism   . Hyposmolality and/or hyponatremia     07/13/15 Na 131  . Unspecified vitamin D deficiency   . Pure hypercholesterolemia   . Other B-complex deficiencies   . Osteoarthrosis of knee     bilaterally  . Closed fracture of unspecified part of vertebral column without mention of spinal cord injury     T7 s/p kyphoplasty, T12  . Unspecified essential hypertension   . Pain in joint, site unspecified  severe, diffuse, chronic pain  . Osteoarthrosis, hip   . Edema 2015  . CKD stage 2 due to type 2 diabetes mellitus ([place]) 11/18/2014  . Neuropathy ([place])   . Positive ANA (antinuclear antibody)   . Insomnia   . Pulmonary embolism ([place]) 05/07/2015  . Chronic hyponatremia 09/05/2015    Past Surgical History  Procedure Laterality Date  . Dilation and curettage of uterus  X 2  . Middle  ear surgery Bilateral 1938  . Mastectomy Left 04/29/1989  . Tonsillectomy  1968  . Nasal sinus surgery  1990 & 2000  . Laparoscopic cholecystectomy  09/20/2006  . Orif hip fracture Left 06/13/2007    following a syncopal episode  . Kyphoplasty  07/03/2010    T12  . Kyphoplasty      C7  . Femur im nail Right 03/18/2015    Procedure: INTRAMEDULLARY (IM) NAIL FEMORAL;  Surgeon: Rod Can, MD;  Location: [facility];  Service: Orthopedics;  Laterality: Right;  . Fracture surgery       reports that she has never smoked. She has never used smokeless tobacco. She reports that she drinks alcohol. She reports that she does not use illicit drugs.  Allergies  Allergen Reactions  . Aspirin     Drop in body temp with large quantities, can tolerate low doses of aspirin  . Penicillins Swelling    Has patient had a PCN reaction causing immediate rash, facial/tongue/throat swelling, SOB or lightheadedness with hypotension: No Has patient had a PCN reaction causing severe rash involving mucus membranes or skin necrosis: No Has patient had a PCN reaction that required hospitalization No Has patient had a PCN reaction occurring within the last 10 years: No If all of the above answers are "NO", then may proceed with Cephalosporin use.    History reviewed. No pertinent family history.  Family history reviewed and not pertinent.  Prior to Admission medications   Medication Sig Start Date End Date Taking? Authorizing Provider  apixaban (ELIQUIS) 5 MG TABS tablet Take 1 tablet (5 mg total) by mouth 2 (two) times daily. 05/15/15  Yes Thurnell Lose, MD  atenolol (TENORMIN) 25 MG tablet Take one tablet by mouth once daily for blood pressure 02/12/15  Yes Lauree Chandler, NP  cholecalciferol (VITAMIN D) 1000 UNITS tablet Take 3,000 Units by mouth daily.   Yes Historical Provider, MD  Cyanocobalamin (VITAMIN B-12 CR PO) Take 1 tablet by mouth daily.    Yes Historical Provider, MD  denosumab (PROLIA) 60 MG/ML  SOLN injection Inject 60 mg into the skin every 6 (six) months. Reported on 07/21/2015   Yes Historical Provider, MD  doxazosin (CARDURA) 4 MG tablet One daily to help control BP 06/30/15  Yes Estill Dooms, MD  feeding supplement (BOOST / RESOURCE BREEZE) LIQD Take 1 Container by mouth daily with supper.    Yes Historical Provider, MD  furosemide (LASIX) 40 MG tablet One daily to help control edema 06/30/15  Yes Estill Dooms, MD  irbesartan (AVAPRO) 300 MG tablet One daily to control BP 05/07/15  Yes Estill Dooms, MD  levothyroxine (SYNTHROID, LEVOTHROID) 112 MCG tablet Take one tablet by mouth once daily for thyroid supplement 03/26/14  Yes Estill Dooms, MD  Lidocaine (ASPERCREME LIDOCAINE) 4 % PTCH Apply 3 patches topically once. Apply 1 patch to the left hip, left knee, and the right shoulder in the morning. Remove in the evening. 05/14/15  Yes Estill Dooms, MD  loratadine (CLARITIN) 10 MG  tablet Take 10 mg by mouth daily.   Yes Historical Provider, MD  omeprazole (PRILOSEC) 20 MG capsule Take 1 capsule (20 mg total) by mouth daily. 05/07/15  Yes Estill Dooms, MD  oxyCODONE-acetaminophen (PERCOCET) 7.5-325 MG tablet Take one tablet at 10 AM, 1 PM, 8 PM, and 10 PM for pain control. May take an extra tablet between midnight and 6 AM if needed for pain control. 09/02/15  Yes Estill Dooms, MD  prochlorperazine (COMPAZINE) 10 MG tablet Take 1 tablet (10 mg total) by mouth every 6 (six) hours as needed for nausea or vomiting. 03/23/15  Yes Eugenie Filler, MD  senna-docusate (SENOKOT S) 8.6-50 MG tablet 2 tablets nightly to prevent constipation 06/30/15  Yes Estill Dooms, MD  spironolactone (ALDACTONE) 25 MG tablet One each morning to reduce edema and to strengthen the heart 07/21/15  Yes Estill Dooms, MD  zolpidem [facility]) 5 MG tablet Take one tablet by mouth at bedtime for sleep 07/22/15  Yes Estill Dooms, MD    Physical Exam: Filed Vitals:   09/05/15 1113 09/05/15 1331 09/05/15 1503  09/05/15 1717  BP: 121/87 133/65 156/74 170/95  Pulse: 58 52 53 57  Temp: 98.1 F (36.7 C)     TempSrc: Oral     Resp: 18 13 14 26   SpO2: 100% 100% 100% 100%      Constitutional: NAD, calm, comfortable.Lying on gurney no acute cardiopulmonary distress. Filed Vitals:   09/05/15 1113 09/05/15 1331 09/05/15 1503 09/05/15 1717  BP: 121/87 133/65 156/74 170/95  Pulse: 58 52 53 57  Temp: 98.1 F (36.7 C)     TempSrc: Oral     Resp: 18 13 14 26   SpO2: 100% 100% 100% 100%   Eyes: PERRLA, EOMI, lids and conjunctivae normal ENMT: Mucous membranes are dry. Posterior pharynx clear of any exudate or lesions. Dentures in place.  Neck: normal, supple, no masses, no thyromegaly Respiratory: clear to auscultation bilaterally, no wheezing, no crackles. Normal respiratory effort. No accessory muscle use.  Cardiovascular: Regular rate and rhythm, no murmurs / rubs / gallops. No extremity edema. 2+ pedal pulses. No carotid bruits.  Abdomen: no tenderness, no masses palpated. No hepatosplenomegaly. Bowel sounds positive.  Musculoskeletal: no clubbing / cyanosis. No joint deformity upper and lower extremities. Good ROM, no contractures. Normal muscle tone.  Skin: no rashes, lesions, ulcers. No induration Neurologic: CN 2-12 grossly intact. Sensation intact, DTR normal. Strength 5/5 in all 4.  Psychiatric: Normal judgment and insight. Alert and oriented x 3. Normal mood.   Labs on Admission: I have personally reviewed following labs and imaging studies  CBC:  Recent Labs Lab 09/05/15 1234  WBC 6.7  NEUTROABS 5.2  HGB 10.7*  HCT 29.8*  MCV 91.4  PLT [id number]   Basic Metabolic Panel:  Recent Labs Lab 09/05/15 1234  NA 123*  K 5.2*  CL 92*  CO2 24  GLUCOSE 86  BUN 43*  CREATININE 1.44*  CALCIUM 8.6*   GFR: Estimated Creatinine Clearance: 25.2 mL/min (by C-G formula based on Cr of 1.44). Liver Function Tests:  Recent Labs Lab 09/05/15 1234  AST 19  ALT 14  ALKPHOS 54  BILITOT  0.9  PROT 6.6  ALBUMIN 3.7    Recent Labs Lab 09/05/15 1234  LIPASE 38   No results for input(s): AMMONIA in the last 168 hours. Coagulation Profile: No results for input(s): INR, PROTIME in the last 168 hours. Cardiac Enzymes:  Recent Labs Lab 09/05/15 1234  TROPONINI <0.03   BNP (last 3 results) No results for input(s): PROBNP in the last 8760 hours. HbA1C: No results for input(s): HGBA1C in the last 72 hours. CBG: No results for input(s): GLUCAP in the last 168 hours. Lipid Profile: No results for input(s): CHOL, HDL, LDLCALC, TRIG, CHOLHDL, LDLDIRECT in the last 72 hours. Thyroid Function Tests: No results for input(s): TSH, T4TOTAL, FREET4, T3FREE, THYROIDAB in the last 72 hours. Anemia Panel: No results for input(s): VITAMINB12, FOLATE, FERRITIN, TIBC, IRON, RETICCTPCT in the last 72 hours. Urine analysis:    Component Value Date/Time   COLORURINE YELLOW 09/05/2015 1409   APPEARANCEUR CLEAR 09/05/2015 1409   LABSPEC 1.007 09/05/2015 1409   PHURINE 7.5 09/05/2015 1409   GLUCOSEU NEGATIVE 09/05/2015 1409   HGBUR NEGATIVE 09/05/2015 1409   BILIRUBINUR NEGATIVE 09/05/2015 1409   KETONESUR NEGATIVE 09/05/2015 1409   PROTEINUR NEGATIVE 09/05/2015 1409   UROBILINOGEN 1.0 03/22/2015 1200   NITRITE NEGATIVE 09/05/2015 1409   LEUKOCYTESUR NEGATIVE 09/05/2015 1409   Sepsis Labs: @LABRCNTIP (procalcitonin:4,lacticidven:4) )No results found for this or any previous visit (from the past 240 hour(s)).   Radiological Exams on Admission: Dg Chest 2 View  09/05/2015  CLINICAL DATA:  Dyspnea, hypotension history of pulmonary emboli EXAM: CHEST  2 VIEW COMPARISON:  CT 05/07/2015 FINDINGS: Normal cardiac silhouette. Lungs are hyperinflated. No effusion, infiltrate or pneumothorax. There is compression fractures in the mid thoracic spine. There vertebroplasty augmentation in mid thoracic spinal or thoracic spine IMPRESSION: Hyperinflated lungs with no acute findings Chronic  compression fractures in the thoracic spine Electronically Signed   By: Suzy Bouchard M.D.   On: 09/05/2015 13:01    EKG: Independently reviewed. Normal sinus rhythm with increased PR interval. No ischemic changes noted.  Assessment/Plan Principal Problem:   Hypotension Active Problems:   Hyponatremia   Senile osteoporosis   Hypothyroidism   Pure hypercholesterolemia   Essential hypertension   CKD (chronic kidney disease) stage 2, GFR 60-89 ml/min   GERD (gastroesophageal reflux disease)   Constipation   Chronic pain syndrome   Pulmonary embolus (HCC)   AKI (acute kidney injury) (HCC)   Hyperkalemia   Anemia   Chronic hyponatremia   #1 hypotension Likely secondary to volume depletion secondary to diuretics S patient is on Lasix and spironolactone as well as antihypertensive medications. When EMS arrived patient was noted to have systolic blood pressure in the 80s and given a bolus of IV fluids. Patient noted to be orthostatic when checked in the emergency room. Blood pressure improved with some fluid boluses. Urinalysis is negative. Chest x-ray is negative. Patient is afebrile with no signs or symptoms of infection. EKG with no ischemic changes. Troponin was negative. Patient with no overt bleeding hemoglobin currently at 10.7. Will admit patient under observation. Place on IV fluids and hold diuretic medications. Placed on TED hose. Follow.  #2 acute on chronic hyponatremia Patient with chronic hyponatremia with  baseline sodium of approximately 130. Sodium on admission is 123. Likely secondary to hypovolemic hyponatremia secondary to diuretics and ARB. Will hold diuretics and ARB. Check a urine sodium. Check a urine creatinine. Check a urine and serum osmolality. Hold diuretics and ARB. Hydrate gently with IV fluids. Follow.   #3 acute kidney injury on chronic kidney disease stage II Likely secondary to prerenal azotemia and the setting of ARB and diuretics. Patient with dry  mucous membranes. Will check a fractional excretion of sodium. Place on IV fluids. Hold nephrotoxins including ARB and diuretics. If no improvement with renal  function will check a renal ultrasound. Follow.  #4 gastroesophageal reflux disease PPI.  #5 hypothyroidism Check a TSH. Continue home dose Synthroid.  #6 hyperkalemia Repeat labs in the morning. Follow.  #7 history of pulmonary emboli Continue Eliquis.  #8 anemia Patient with no overt bleeding. Check an anemia panel. Follow H&H.  #9 constipation Continue home bowel regimen.  #10 chronic pain syndrome Continue home regimen of pain medications.  #11 prophylaxis PPI for GI prophylaxis.   DVT prophylaxis: Eliquis Code Status: DO NOT RESUSCITATE Family Communication: Updated patient. No family at bedside. Disposition Plan: Back to nursing facility once hypotension resolved. Consults called: None Admission status: Observation   Bailey Konecny MD [facility] Pager [phone]  If 7PM-7AM, please contact night-coverage [email or web address] Password [facility]  09/05/2015, 5:24 PM

## 2015-09-06 DIAGNOSIS — D638 Anemia in other chronic diseases classified elsewhere: Secondary | ICD-10-CM | POA: Diagnosis present

## 2015-09-06 DIAGNOSIS — R1310 Dysphagia, unspecified: Secondary | ICD-10-CM | POA: Diagnosis present

## 2015-09-06 DIAGNOSIS — K219 Gastro-esophageal reflux disease without esophagitis: Secondary | ICD-10-CM | POA: Diagnosis present

## 2015-09-06 DIAGNOSIS — Z86711 Personal history of pulmonary embolism: Secondary | ICD-10-CM | POA: Diagnosis not present

## 2015-09-06 DIAGNOSIS — Z7901 Long term (current) use of anticoagulants: Secondary | ICD-10-CM | POA: Diagnosis not present

## 2015-09-06 DIAGNOSIS — E7800 Pure hypercholesterolemia, unspecified: Secondary | ICD-10-CM | POA: Diagnosis present

## 2015-09-06 DIAGNOSIS — J329 Chronic sinusitis, unspecified: Secondary | ICD-10-CM | POA: Diagnosis present

## 2015-09-06 DIAGNOSIS — I129 Hypertensive chronic kidney disease with stage 1 through stage 4 chronic kidney disease, or unspecified chronic kidney disease: Secondary | ICD-10-CM | POA: Diagnosis present

## 2015-09-06 DIAGNOSIS — N289 Disorder of kidney and ureter, unspecified: Secondary | ICD-10-CM | POA: Diagnosis not present

## 2015-09-06 DIAGNOSIS — E871 Hypo-osmolality and hyponatremia: Secondary | ICD-10-CM | POA: Diagnosis not present

## 2015-09-06 DIAGNOSIS — E860 Dehydration: Secondary | ICD-10-CM | POA: Diagnosis present

## 2015-09-06 DIAGNOSIS — E785 Hyperlipidemia, unspecified: Secondary | ICD-10-CM | POA: Diagnosis present

## 2015-09-06 DIAGNOSIS — R42 Dizziness and giddiness: Secondary | ICD-10-CM | POA: Diagnosis not present

## 2015-09-06 DIAGNOSIS — Z79899 Other long term (current) drug therapy: Secondary | ICD-10-CM | POA: Diagnosis not present

## 2015-09-06 DIAGNOSIS — M810 Age-related osteoporosis without current pathological fracture: Secondary | ICD-10-CM | POA: Diagnosis present

## 2015-09-06 DIAGNOSIS — R2681 Unsteadiness on feet: Secondary | ICD-10-CM | POA: Diagnosis not present

## 2015-09-06 DIAGNOSIS — G894 Chronic pain syndrome: Secondary | ICD-10-CM | POA: Diagnosis not present

## 2015-09-06 DIAGNOSIS — E039 Hypothyroidism, unspecified: Secondary | ICD-10-CM | POA: Diagnosis present

## 2015-09-06 DIAGNOSIS — M6281 Muscle weakness (generalized): Secondary | ICD-10-CM | POA: Diagnosis not present

## 2015-09-06 DIAGNOSIS — R1312 Dysphagia, oropharyngeal phase: Secondary | ICD-10-CM | POA: Diagnosis not present

## 2015-09-06 DIAGNOSIS — Z9012 Acquired absence of left breast and nipple: Secondary | ICD-10-CM | POA: Diagnosis not present

## 2015-09-06 DIAGNOSIS — R55 Syncope and collapse: Secondary | ICD-10-CM | POA: Diagnosis not present

## 2015-09-06 DIAGNOSIS — I951 Orthostatic hypotension: Secondary | ICD-10-CM | POA: Diagnosis not present

## 2015-09-06 DIAGNOSIS — Z66 Do not resuscitate: Secondary | ICD-10-CM | POA: Diagnosis present

## 2015-09-06 DIAGNOSIS — N182 Chronic kidney disease, stage 2 (mild): Secondary | ICD-10-CM | POA: Diagnosis not present

## 2015-09-06 DIAGNOSIS — K5900 Constipation, unspecified: Secondary | ICD-10-CM | POA: Diagnosis present

## 2015-09-06 DIAGNOSIS — T502X5A Adverse effect of carbonic-anhydrase inhibitors, benzothiadiazides and other diuretics, initial encounter: Secondary | ICD-10-CM | POA: Diagnosis present

## 2015-09-06 DIAGNOSIS — I2692 Saddle embolus of pulmonary artery without acute cor pulmonale: Secondary | ICD-10-CM | POA: Diagnosis not present

## 2015-09-06 DIAGNOSIS — E875 Hyperkalemia: Secondary | ICD-10-CM | POA: Diagnosis present

## 2015-09-06 DIAGNOSIS — I959 Hypotension, unspecified: Secondary | ICD-10-CM | POA: Diagnosis not present

## 2015-09-06 DIAGNOSIS — N179 Acute kidney failure, unspecified: Secondary | ICD-10-CM | POA: Diagnosis not present

## 2015-09-06 LAB — TSH: TSH: 1.15 u[IU]/mL (ref 0.350–4.500)

## 2015-09-06 LAB — CBC
HEMATOCRIT: 30.9 % — AB (ref 36.0–46.0)
HEMOGLOBIN: 10.9 g/dL — AB (ref 12.0–15.0)
MCH: 33.1 pg (ref 26.0–34.0)
MCHC: 35.3 g/dL (ref 30.0–36.0)
MCV: 93.9 fL (ref 78.0–100.0)
Platelets: 160 10*3/uL (ref 150–400)
RBC: 3.29 MIL/uL — ABNORMAL LOW (ref 3.87–5.11)
RDW: 12.4 % (ref 11.5–15.5)
WBC: 5.4 10*3/uL (ref 4.0–10.5)

## 2015-09-06 LAB — BASIC METABOLIC PANEL
Anion gap: 8 (ref 5–15)
BUN: 31 mg/dL — AB (ref 6–20)
CHLORIDE: 102 mmol/L (ref 101–111)
CO2: 23 mmol/L (ref 22–32)
CREATININE: 1.32 mg/dL — AB (ref 0.44–1.00)
Calcium: 8.6 mg/dL — ABNORMAL LOW (ref 8.9–10.3)
GFR calc Af Amer: 41 mL/min — ABNORMAL LOW (ref >60)
GFR calc non Af Amer: 35 mL/min — ABNORMAL LOW (ref >60)
GLUCOSE: 102 mg/dL — AB (ref 65–99)
POTASSIUM: 4.9 mmol/L (ref 3.5–5.1)
SODIUM: 133 mmol/L — AB (ref 135–145)

## 2015-09-06 LAB — GLUCOSE, CAPILLARY: Glucose-Capillary: 88 mg/dL (ref 65–99)

## 2015-09-06 NOTE — Progress Notes (Signed)
PROGRESS NOTE    Bailey [name]  [id number] DOB: Nov 24, 1926 DOA: 09/05/2015 PCP: Estill Dooms, MD  Outpatient Specialists:    Assessment & Plan:   Principal Problem:   Hypotension Active Problems:   Hyponatremia   Senile osteoporosis   Hypothyroidism   Pure hypercholesterolemia   Essential hypertension   CKD (chronic kidney disease) stage 2, GFR 60-89 ml/min   GERD (gastroesophageal reflux disease)   Constipation   Chronic pain syndrome   Pulmonary embolus (HCC)   AKI (acute kidney injury) (HCC)   Hyperkalemia   Anemia   Chronic hyponatremia  #1 hypotension/orthostatic hypotension Likely secondary to diuretics and antihypertensive medications. Clinical improvement and some improvement with orthostasis. Continue to hold antihypertensive medications and diuretics. IV fluids. Supportive care. Follow.  #2 acute on chronic hyponatremia Likely secondary to hypovolemic hyponatremia. Improved with hydration. Continue to hold ARB and diuretics Follow.  #3 hypothyroidism Continue Synthroid.  #4 acute kidney injury on chronic kidney disease stage II Improving with hydration and holding hypnotics medications.  #5 gastroesophageal reflux disease PPI.  #6 history of PE Continue chronic anticoagulation with Eliquis.  #7 constipation Continue bowel regimen.  #8 anemia Anemia panel consistent with anemia of chronic disease. Follow H&H.  #9 hyperkalemia Resolved.   DVT prophylaxis: On eliquis Code Status: DO NOT RESUSCITATE Family Communication: Updated patient. No family at bedside. Disposition Plan: Home once orthostatic hypotension/hypertension has resolved and patient tolerating oral intake in 1-2 days.   Consultants:   None  Procedures:   Chest x-ray 09/05/2015  Antimicrobials:   None   Subjective: Patient states some improvement with dizziness. Patient denies any shortness of breath. No chest pain.  Objective: Filed Vitals:   09/05/15 2353  09/06/15 0519 09/06/15 0944 09/06/15 1306  BP: 143/67 167/78 137/67   Pulse:  55    Temp:  97.6 F (36.4 C)  97.5 F (36.4 C)  TempSrc:  Oral    Resp:  20    Height:      Weight:  58.5 kg (128 lb 15.5 oz)    SpO2:  100%  100%    Intake/Output Summary (Last 24 hours) at 09/06/15 1726 Last data filed at 09/06/15 1500  Gross per 24 hour  Intake   1810 ml  Output      0 ml  Net   1810 ml   Filed Weights   09/05/15 1754 09/06/15 0519  Weight: 60.9 kg (134 lb 4.2 oz) 58.5 kg (128 lb 15.5 oz)    Examination:  General exam: Appears calm and comfortable  Respiratory system: Clear to auscultation. Respiratory effort normal. Cardiovascular system: S1 & S2 heard, RRR. No JVD, murmurs, rubs, gallops or clicks. No pedal edema. Gastrointestinal system: Abdomen is nondistended, soft and nontender. No organomegaly or masses felt. Normal bowel sounds heard. Central nervous system: Alert and oriented. No focal neurological deficits. Extremities: Symmetric 5 x 5 power. Skin: No rashes, lesions or ulcers Psychiatry: Judgement and insight appear normal. Mood & affect appropriate.     Data Reviewed: I have personally reviewed following labs and imaging studies  CBC:  Recent Labs Lab 09/05/15 1234 09/06/15 0516  WBC 6.7 5.4  NEUTROABS 5.2  --   HGB 10.7* 10.9*  HCT 29.8* 30.9*  MCV 91.4 93.9  PLT 150 0000000   Basic Metabolic Panel:  Recent Labs Lab 09/05/15 1234 09/05/15 1704 09/06/15 0516  NA 123*  --  133*  K 5.2*  --  4.9  CL 92*  --  102  CO2 24  --  23  GLUCOSE 86  --  102*  BUN 43*  --  31*  CREATININE 1.44*  --  1.32*  CALCIUM 8.6*  --  8.6*  MG  --  2.2  --    GFR: Estimated Creatinine Clearance: 27.5 mL/min (by C-G formula based on Cr of 1.32). Liver Function Tests:  Recent Labs Lab 09/05/15 1234  AST 19  ALT 14  ALKPHOS 54  BILITOT 0.9  PROT 6.6  ALBUMIN 3.7    Recent Labs Lab 09/05/15 1234  LIPASE 38   No results for input(s): AMMONIA in the  last 168 hours. Coagulation Profile: No results for input(s): INR, PROTIME in the last 168 hours. Cardiac Enzymes:  Recent Labs Lab 09/05/15 1234  TROPONINI <0.03   BNP (last 3 results) No results for input(s): PROBNP in the last 8760 hours. HbA1C: No results for input(s): HGBA1C in the last 72 hours. CBG:  Recent Labs Lab 09/06/15 0733  GLUCAP 88   Lipid Profile: No results for input(s): CHOL, HDL, LDLCALC, TRIG, CHOLHDL, LDLDIRECT in the last 72 hours. Thyroid Function Tests:  Recent Labs  09/06/15 0518  TSH 1.150   Anemia Panel:  Recent Labs  09/05/15 1704  VITAMINB12 1380*  FOLATE 23.4  FERRITIN 124  TIBC 265  IRON 75  RETICCTPCT 1.4   Urine analysis:    Component Value Date/Time   COLORURINE YELLOW 09/05/2015 1409   APPEARANCEUR CLEAR 09/05/2015 1409   LABSPEC 1.007 09/05/2015 1409   PHURINE 7.5 09/05/2015 1409   GLUCOSEU NEGATIVE 09/05/2015 1409   HGBUR NEGATIVE 09/05/2015 1409   BILIRUBINUR NEGATIVE 09/05/2015 1409   KETONESUR NEGATIVE 09/05/2015 1409   PROTEINUR NEGATIVE 09/05/2015 1409   UROBILINOGEN 1.0 03/22/2015 1200   NITRITE NEGATIVE 09/05/2015 1409   LEUKOCYTESUR NEGATIVE 09/05/2015 1409   Sepsis Labs: @LABRCNTIP (procalcitonin:4,lacticidven:4)  )No results found for this or any previous visit (from the past 240 hour(s)).       Radiology Studies: Dg Chest 2 View  09/05/2015  CLINICAL DATA:  Dyspnea, hypotension history of pulmonary emboli EXAM: CHEST  2 VIEW COMPARISON:  CT 05/07/2015 FINDINGS: Normal cardiac silhouette. Lungs are hyperinflated. No effusion, infiltrate or pneumothorax. There is compression fractures in the mid thoracic spine. There vertebroplasty augmentation in mid thoracic spinal or thoracic spine IMPRESSION: Hyperinflated lungs with no acute findings Chronic compression fractures in the thoracic spine Electronically Signed   By: Suzy Bouchard M.D.   On: 09/05/2015 13:01        Scheduled Meds: . apixaban   5 mg Oral BID  . cholecalciferol  3,000 Units Oral Daily  . doxazosin  4 mg Oral Daily  . feeding supplement  1 Container Oral Q supper  . levothyroxine  112 mcg Oral QAC breakfast  . loratadine  10 mg Oral Daily  . oxyCODONE-acetaminophen  1 tablet Oral QID  . pantoprazole  40 mg Oral Daily  . senna-docusate  2 tablet Oral QHS  . sodium chloride flush  3 mL Intravenous Q12H  . vitamin B-12  1,000 mcg Oral Daily   Continuous Infusions: . sodium chloride 0.9 % 1,000 mL infusion 75 mL/hr at 09/06/15 1500        Time spent: 49 mins    Mikylah Ackroyd, MD [facility] Pager [phone]  If 7PM-7AM, please contact night-coverage [email or web address] Password [facility] 09/06/2015, 5:26 PM

## 2015-09-06 NOTE — Care Management Note (Signed)
Case Management Note  Patient Details  Name: KRISTIANNA GAMBRELL MRN: [id number] Date of Birth: 1927-03-23  Subjective/Objective:        Hypotension, hyponatremia            Action/Plan: Discharge Planning:  NCM spoke to pt and states her husband lives in their [facility] [address], but she had to move to the SNF at [facility] referral for SNF placement.   Expected Discharge Date:  09/07/15               Expected Discharge Plan:  Skilled Nursing Facility  In-House Referral:  Clinical Social Work  Discharge planning Services  CM Consult  Post Acute Care Choice:  NA Choice offered to:  NA  DME Arranged:  N/A DME Agency:  NA  HH Arranged:  NA HH Agency:  NA  Status of Service:  Completed, signed off  Medicare Important Message Given:    Date Medicare IM Given:    Medicare IM give by:    Date Additional Medicare IM Given:    Additional Medicare Important Message give by:     If discussed at [place] of Stay Meetings, dates discussed:    Additional Comments:  Erenest Rasher, RN 09/06/2015, 11:47 AM

## 2015-09-06 NOTE — Progress Notes (Addendum)
BP 83/49 when tech did night time vitals, rechecked manually and got 90/58. K.Schorr paged, order given for 500 cc bolus. BP came up to 143/67. Will continue to monitor.

## 2015-09-06 NOTE — Care Management Obs Status (Signed)
[facility] NOTIFICATION   Patient Details  Name: Bailey [name] MRN: [id number] Date of Birth: [birth year]   Medicare Observation Status Notification Given:  Yes    Erenest Rasher, RN 09/06/2015, 11:16 AM

## 2015-09-06 NOTE — Evaluation (Signed)
Physical Therapy Evaluation Patient Details Name: Bailey [name] MRN: [id number] DOB: 1926/07/03 Today's Date: 09/06/2015   History of Present Illness  Bailey [name] is a 82 y.o. female with medical history significant of osteoporosis, hypothyroidism, chronic hyponatremia with baseline sodium approximately 1:30 to 131, vitamin D deficiency, hyperlipidemia, hypertension, history of PE on chronic anticoagulation, chronic kidney disease stage II who presents to the ED after dizziness and a presyncopal episode  Clinical Impression  Pt with some weakness noted different from baseline, and dizziness as well. To benefit from Pt to monitor these items and progress pt back to ModI ambulation in her apartment and facility.     Follow Up Recommendations SNF (PT in the area of which is is transitioning at [facility])    Equipment Recommendations  None recommended by PT    Recommendations for Other Services       Precautions / Restrictions        Mobility  Bed Mobility Overal bed mobility: Needs Assistance Bed Mobility: Supine to Sit;Sit to Supine     Supine to sit: Min guard Sit to supine: Min guard      Transfers Overall transfer level: Needs assistance Equipment used: None Transfers: Sit to/from Stand Sit to Stand: Min guard            Ambulation/Gait Ambulation/Gait assistance: Min assist Ambulation Distance (Feet): 15 Feet Assistive device: 1 person hand held assist Gait Pattern/deviations: Step-to pattern Gait velocity: slow   General Gait Details: small steps from bathroom back to bed. did not use RW for she could ambulate with HHA this distance. Wanted to walk further however once pt sat at EOB she began to feel dizzy and slightly sweaty feeling as well. BP taken 128/69 HR 58  Stairs            Wheelchair Mobility    Modified Rankin (Stroke Patients Only)       Balance                                             Pertinent  Vitals/Pain Pain Assessment: No/denies pain    Home Living Family/patient expects to be discharged to:: Other (Comment) ([place] independent apartment with her husband ) Living Arrangements: Spouse/significant other               Additional Comments: Pt recently in "hospital care/SNF at [facility]" area due to nbot feeling well and plans to trasnition back there before she trasnitions back to her apartment.     Prior Function Level of Independence: Independent with assistive device(s)         Comments: uses RW to ambulate in apratmetn and to dining hall      Hand Dominance   Dominant Hand: Right    Extremity/Trunk Assessment               Lower Extremity Assessment: Generalized weakness         Communication   Communication: No difficulties  Cognition Arousal/Alertness: Awake/alert Behavior During Therapy: WFL for tasks assessed/performed Overall Cognitive Status: Within Functional Limits for tasks assessed                      General Comments      Exercises        Assessment/Plan    PT Assessment Patient needs continued PT services  PT Diagnosis Generalized weakness;Difficulty walking   PT Problem List Decreased strength;Decreased activity tolerance;Decreased mobility  PT Treatment Interventions Gait training;Functional mobility training;Therapeutic activities;Therapeutic exercise;Patient/family education   PT Goals (Current goals can be found in the Care Plan section) Acute Rehab PT Goals Patient Stated Goal: I want to make it back to my apartement eventually  PT Goal Formulation: With patient Time For Goal Achievement: 09/19/15 Potential to Achieve Goals: Good    Frequency Min 3X/week   Barriers to discharge        Co-evaluation               End of Session Equipment Utilized During Treatment: Gait belt Activity Tolerance: Patient tolerated treatment well Patient left: in bed;with call bell/phone within  reach;with bed alarm set Nurse Communication: Mobility status    Functional Assessment Tool Used: clinical judgement  Functional Limitation: Mobility: Walking and moving around Mobility: Walking and Moving Around Current Status [id number]): At least 1 percent but less than 20 percent impaired, limited or restricted Mobility: Walking and Moving Around Goal Status [phone]): At least 1 percent but less than 20 percent impaired, limited or restricted    Time: 1230-1249 PT Time Calculation (min) (ACUTE ONLY): 19 min   Charges:   PT Evaluation $PT Eval Low Complexity: 1 Procedure PT Treatments $Gait Training: 8-22 mins   PT G Codes:   PT G-Codes **NOT FOR INPATIENT CLASS** Functional Assessment Tool Used: clinical judgement  Functional Limitation: Mobility: Walking and moving around Mobility: Walking and Moving Around Current Status [id number]): At least 1 percent but less than 20 percent impaired, limited or restricted Mobility: Walking and Moving Around Goal Status [phone]): At least 1 percent but less than 20 percent impaired, limited or restricted    Clide Dales 09/06/2015, 1:18 PM  Clide Dales, PT Pager: [phone] 09/06/2015

## 2015-09-06 NOTE — NC FL2 (Signed)
[place] LEVEL OF CARE SCREENING TOOL     IDENTIFICATION  Patient Name: Bailey [name] Birthdate: [birth year] Sex: female Admission Date (Current Location): 09/05/2015  [facility] and [place] Number:  Herbalist and Address:  [facility],  [place], [place]      Provider Number: [id number]  Attending Physician Name and Address:  Eugenie Filler, MD  Relative Name and Phone Number:       Current Level of Care: Hospital Recommended Level of Care: [place] Prior Approval Number:    Date Approved/Denied:   PASRR Number: [id number]  Discharge Plan: SNF    Current Diagnoses: Patient Active Problem List   Diagnosis Date Noted  . Hypotension 09/05/2015  . AKI (acute kidney injury) ([place]) 09/05/2015  . Hyperkalemia 09/05/2015  . Anemia 09/05/2015  . Chronic hyponatremia 09/05/2015  . Hyponatremia 05/26/2015  . Pulmonary embolus ([place]) 05/07/2015  . Chronic pain syndrome 04/16/2015  . Muscle spasm of right leg 03/31/2015  . Fracture of multiple pubic rami ([place]) 03/30/2015  . UTI (urinary tract infection) 03/27/2015  . GERD (gastroesophageal reflux disease) 03/24/2015  . Constipation 03/24/2015  . Fracture, intertrochanteric, right femur ([place])   . CKD (chronic kidney disease) stage 2, GFR 60-89 ml/min 11/18/2014  . Osteoarthritis 03/25/2014  . Osteoarthrosis, forearm   . Senile osteoporosis   . Malignant neoplasm of breast (female), unspecified site   . Insomnia   . Hypothyroidism   . Pure hypercholesterolemia   . Closed fracture of unspecified part of vertebral column without mention of spinal cord injury   . Essential hypertension   . Osteoarthrosis, hip   . Edema     Orientation RESPIRATION BLADDER Height & Weight     Self, Time, Situation, Place  Normal Continent Weight: 128 lb 15.5 oz (58.5 kg) Height:  5\' 5"  (165.1 cm)  BEHAVIORAL SYMPTOMS/MOOD NEUROLOGICAL BOWEL NUTRITION STATUS   Continent Diet (Please see discharge summary.)  AMBULATORY STATUS COMMUNICATION OF NEEDS Skin   Limited Assist Verbally Normal                       Personal Care Assistance Level of Assistance  Bathing, Feeding, Dressing Bathing Assistance: Limited assistance Feeding assistance: Limited assistance Dressing Assistance: Limited assistance     Functional Limitations Info             SPECIAL CARE FACTORS FREQUENCY  PT (By licensed PT), OT (By licensed OT)     PT Frequency: 5 OT Frequency: 5            Contractures      Additional Factors Info  Code Status, Allergies Code Status Info: DNR Allergies Info: Aspirin, Penicillins           Current Medications (09/06/2015):  This is the current hospital active medication list Current Facility-Administered Medications  Medication Dose Route Frequency Provider Last Rate Last Dose  . acetaminophen (TYLENOL) tablet 650 mg  650 mg Oral Q4H PRN Eugenie Filler, MD   650 mg at 09/05/15 1726  . apixaban (ELIQUIS) tablet 5 mg  5 mg Oral BID Eugenie Filler, MD   5 mg at 09/06/15 0946  . cholecalciferol (VITAMIN D) tablet 3,000 Units  3,000 Units Oral Daily Eugenie Filler, MD   3,000 Units at 09/06/15 0945  . doxazosin (CARDURA) tablet 4 mg  4 mg Oral Daily Eugenie Filler, MD   4 mg at 09/06/15 0944  .  feeding supplement (BOOST / RESOURCE BREEZE) liquid 1 Container  1 Container Oral Q supper Eugenie Filler, MD   1 Container at 09/05/15 1745  . hydrALAZINE (APRESOLINE) injection 5 mg  5 mg Intravenous Q6H PRN Eugenie Filler, MD      . HYDROcodone-homatropine [facility]) 5-1.5 MG/5ML syrup 5 mL  5 mL Oral Q6H PRN Eugenie Filler, MD      . levothyroxine (SYNTHROID, LEVOTHROID) tablet 112 mcg  112 mcg Oral QAC breakfast Eugenie Filler, MD   112 mcg at 09/06/15 0945  . loratadine (CLARITIN) tablet 10 mg  10 mg Oral Daily Eugenie Filler, MD   10 mg at 09/06/15 0945  . morphine 2 MG/ML injection 2 mg  2 mg  Intravenous Q4H PRN Eugenie Filler, MD      . ondansetron [facility]) tablet 4 mg  4 mg Oral Q6H PRN Eugenie Filler, MD       Or  . ondansetron [facility]) injection 4 mg  4 mg Intravenous Q6H PRN Eugenie Filler, MD      . oxyCODONE-acetaminophen (PERCOCET) 7.5-325 MG per tablet 1 tablet  1 tablet Oral QID Eugenie Filler, MD   1 tablet at 09/06/15 0945  . pantoprazole (PROTONIX) EC tablet 40 mg  40 mg Oral Daily Eugenie Filler, MD   40 mg at 09/06/15 0944  . senna-docusate (Senokot-S) tablet 2 tablet  2 tablet Oral QHS Eugenie Filler, MD   2 tablet at 09/05/15 2228  . sodium chloride 0.9 % 1,000 mL infusion   Intravenous Continuous Eugenie Filler, MD 75 mL/hr at 09/05/15 2353    . sodium chloride flush (NS) 0.9 % injection 3 mL  3 mL Intravenous Q12H Eugenie Filler, MD   3 mL at 09/05/15 2229  . sorbitol 70 % solution 30 mL  30 mL Oral Daily PRN Eugenie Filler, MD      . vitamin B-12 (CYANOCOBALAMIN) tablet 1,000 mcg  1,000 mcg Oral Daily Eugenie Filler, MD   1,000 mcg at 09/06/15 0944  . zolpidem (AMBIEN) tablet 5 mg  5 mg Oral QHS PRN Eugenie Filler, MD         Discharge Medications: Please see discharge summary for a list of discharge medications.  Relevant Imaging Results:  Relevant Lab Results:   Additional Information SSN: [id number]  Bailey [name] [phone]

## 2015-09-06 NOTE — Clinical Social Work Note (Signed)
Clinical Social Work Assessment  Patient Details  Name: Bailey [name] MRN: [id number] Date of Birth: 1926/07/08  Date of referral:  09/06/15               Reason for consult:  Facility Placement                Permission sought to share information with:  Chartered certified accountant granted to share information::  Yes, Verbal Permission Granted  Name::        Agency::  [facility]  Relationship::     Contact Information:     Housing/Transportation Living arrangements for the past 2 months:  Materials engineer (From Independent Living at [facility]) Source of Information:  Patient Patient Interpreter Needed:  None Criminal Activity/Legal Involvement Pertinent to Current Situation/Hospitalization:  No - Comment as needed Significant Relationships:  Spouse Lives with:  Spouse Do you feel safe going back to the place where you live?  Yes Need for family participation in patient care:  No (Coment) (Patient able to make own decisions.)  Care giving concerns:  Patient expressed no concerns at this time.   Social Worker assessment / plan:  CSW received referral stating patient admitted from [place] [facility] with plans to return to SNF portion of facility. CSW spoke with patient to confirm discharge disposition. Patient confirmed patient will discharge to [facility] (SNF). CSW to continue to follow and assist with discharge planning needs.  Employment status:  Retired Forensic scientist:  Medicare PT Recommendations:  [place] / Referral to community resources:  [place]  Patient/Family's Response to care:  Patient understanding and agreeable to CSW plan of care.  Patient/Family's Understanding of and Emotional Response to Diagnosis, Current Treatment, and Prognosis:  Patient understanding and agreeable to CSW plan of care.  Emotional Assessment Appearance:  Appears stated  age Attitude/Demeanor/Rapport:  Other (Appropriate) Affect (typically observed):  Accepting, Appropriate, Pleasant Orientation:  Oriented to Self, Oriented to Place, Oriented to  Time, Oriented to Situation Alcohol / Substance use:  Not Applicable Psych involvement (Current and /or in the community):  No (Comment) (Not appropriate on this admission.)  Discharge Needs  Concerns to be addressed:  No discharge needs identified Readmission within the last 30 days:  No Current discharge risk:  None Barriers to Discharge:  No Barriers Identified   Caroline Sauger, LCSW 09/06/2015, 2:37 PM [phone]

## 2015-09-07 DIAGNOSIS — R1310 Dysphagia, unspecified: Secondary | ICD-10-CM

## 2015-09-07 LAB — CBC
HEMATOCRIT: 27.8 % — AB (ref 36.0–46.0)
HEMOGLOBIN: 9.8 g/dL — AB (ref 12.0–15.0)
MCH: 33.7 pg (ref 26.0–34.0)
MCHC: 35.3 g/dL (ref 30.0–36.0)
MCV: 95.5 fL (ref 78.0–100.0)
Platelets: 142 10*3/uL — ABNORMAL LOW (ref 150–400)
RBC: 2.91 MIL/uL — AB (ref 3.87–5.11)
RDW: 12.5 % (ref 11.5–15.5)
WBC: 4.5 10*3/uL (ref 4.0–10.5)

## 2015-09-07 LAB — BASIC METABOLIC PANEL
ANION GAP: 9 (ref 5–15)
BUN: 26 mg/dL — ABNORMAL HIGH (ref 6–20)
CHLORIDE: 106 mmol/L (ref 101–111)
CO2: 19 mmol/L — AB (ref 22–32)
CREATININE: 1.23 mg/dL — AB (ref 0.44–1.00)
Calcium: 8.1 mg/dL — ABNORMAL LOW (ref 8.9–10.3)
GFR calc non Af Amer: 39 mL/min — ABNORMAL LOW (ref >60)
GFR, EST AFRICAN AMERICAN: 45 mL/min — AB (ref >60)
Glucose, Bld: 98 mg/dL (ref 65–99)
POTASSIUM: 4.3 mmol/L (ref 3.5–5.1)
SODIUM: 134 mmol/L — AB (ref 135–145)

## 2015-09-07 LAB — GLUCOSE, CAPILLARY: GLUCOSE-CAPILLARY: 91 mg/dL (ref 65–99)

## 2015-09-07 MED ORDER — FLUTICASONE PROPIONATE 50 MCG/ACT NA SUSP
2.0000 | Freq: Every day | NASAL | Status: DC
  Administered 2015-09-07 – 2015-09-09 (×3): 2 via NASAL
  Filled 2015-09-07: qty 16

## 2015-09-07 MED ORDER — LEVOFLOXACIN 500 MG PO TABS
250.0000 mg | ORAL_TABLET | Freq: Two times a day (BID) | ORAL | Status: DC
  Administered 2015-09-07 – 2015-09-08 (×3): 250 mg via ORAL
  Filled 2015-09-07 (×3): qty 1

## 2015-09-07 NOTE — Evaluation (Signed)
Clinical/Bedside Swallow Evaluation Patient Details  Name: Bailey [name] MRN: [id number] Date of Birth: 29-May-1926  Today's Date: 09/07/2015 Time: SLP Start Time (ACUTE ONLY): 67 SLP Stop Time (ACUTE ONLY): 1310 SLP Time Calculation (min) (ACUTE ONLY): 40 min  Past Medical History:  Past Medical History  Diagnosis Date  . Osteoarthrosis, unspecified whether generalized or localized, forearm     bilaterally wrist  . Allergic rhinitis, cause unspecified   . Senile osteoporosis   . Malignant neoplasm of breast (female), unspecified site 1990    left. Had surgerey and chemo, but no radiation  . Acquired cyst of kidney     left  . 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 Anemia, unspecified   . Unspecified gastritis and gastroduodenitis without mention of hemorrhage   . 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 Other and unspecified nonspecific immunological findings     positive ANA  . History of Epstein-Barr virus infection   . Herpes simplex without mention of complication   . Insomnia, unspecified   . Unspecified hypothyroidism   . Hyposmolality and/or hyponatremia     07/13/15 Na 131  . Unspecified vitamin D deficiency   . Pure hypercholesterolemia   . Other B-complex deficiencies   . Osteoarthrosis of knee     bilaterally  . Closed fracture of unspecified part of vertebral column without mention of spinal cord injury     T7 s/p kyphoplasty, T12  . Unspecified essential hypertension   . Pain in joint, site unspecified     severe, diffuse, chronic pain  . Osteoarthrosis, hip   . Edema 2015  . CKD stage 2 due to type 2 diabetes mellitus ([place]) 11/18/2014  . Neuropathy ([place])   . Positive ANA (antinuclear antibody)   . Insomnia   . Pulmonary embolism ([place]) 05/07/2015  . Chronic hyponatremia 09/05/2015   Past Surgical History:  Past Surgical History  Procedure Laterality Date  .  Dilation and curettage of uterus  X 2  . Middle ear surgery Bilateral 1938  . Mastectomy Left 04/29/1989  . Tonsillectomy  1968  . Nasal sinus surgery  1990 & 2000  . Laparoscopic cholecystectomy  09/20/2006  . Orif hip fracture Left 06/13/2007    following a syncopal episode  . Kyphoplasty  07/03/2010    T12  . Kyphoplasty      C7  . Femur im nail Right 03/18/2015    Procedure: INTRAMEDULLARY (IM) NAIL FEMORAL;  Surgeon: Rod Can, MD;  Location: [facility] ORS;  Service: Orthopedics;  Laterality: Right;  . Fracture surgery     HPI:  82 yo female adm to [facility] with hypotension.  PMH + for gastritis, gastroduodenitis, epistaxis, femur fx, breast cancer s/p chemotherapy (no radiation), PE, constipation, GERD (pt denies GERD).  Pt presented with an isolated incident of dysphagia to pill - she reports pill felt as if it lodged in her esophagus (pointing distally) requiring her to expel it and reswallow.  She then took her medicine with pudding and this helped to clear it.  Swallow eval ordered.    Assessment / Plan / Recommendation Clinical Impression  Pt presents with symptoms consistent with suspected esophageal dysphagia.  She has a negative CN exam and presents with clear voice/cough.  Pt admits to isolated incident of dysphagia to pill x1 causing her to sense lodging in distal esophagus requiring her to expectorate and reswallow with  applesauce.  Belching and delayed cough noted with sequential liquid swallows = pt denies h/o GERD (although it is in her medical hx).  Given pt reports this occured once time only, she has not had weight loss nor pulmonary infections - recommend continue diet monitoring for recurrence.  Provided pt with xerostomia and esophageal precautions/mitigations and advised her to speak to her MD if issue recurrs.  Discussed alternative ways to take medications and need to start intake with liquids to aid oral propulsion.  Thanks for this order.     Aspiration Risk  Mild  aspiration risk    Diet Recommendation Regular;Thin liquid   Liquid Administration via: Cup;Straw Medication Administration: Whole meds with liquid (if problematic take with applesauce) Supervision: Patient able to self feed Compensations: Slow rate;Small sips/bites (start meals with liquids) Postural Changes: Seated upright at 90 degrees;Remain upright for at least 30 minutes after po intake    Other  Recommendations Oral Care Recommendations: Oral care BID   Follow up Recommendations  None    Frequency and Duration   n/a         Prognosis   minimal risk      Swallow Study   General Date of Onset: 09/07/15 HPI: 82 yo female adm to [facility] with hypotension.  PMH + for gastritis, gastroduodenitis, epistaxis, femur fx, breast cancer s/p chemotherapy (no radiation), PE, constipation, GERD (pt denies GERD).  Pt presented with an isolated incident of dysphagia to pill - she reports pill felt as if it lodged in her esophagus (pointing distally) requiring her to expel it and reswallow.  She then took her medicine with pudding and this helped to clear it.  Swallow eval ordered.  Type of Study: Bedside Swallow Evaluation Diet Prior to this Study: Regular;Thin liquids Temperature Spikes Noted: No Respiratory Status: Room air History of Recent Intubation: No Behavior/Cognition: Alert;Cooperative;Pleasant mood Oral Cavity Assessment: Other (comment) (coating on tongue, geographic tongue per MD) Oral Care Completed by SLP: No Oral Cavity - Dentition: Dentures, top;Dentures, bottom Vision: Functional for self-feeding Self-Feeding Abilities: Able to feed self Patient Positioning: Upright in bed Baseline Vocal Quality: Normal Volitional Cough: Strong Volitional Swallow: Able to elicit    Oral/Motor/Sensory Function Overall Oral Motor/Sensory Function: Within functional limits   Ice Chips Ice chips: Not tested   Thin Liquid Thin Liquid: Impaired Pharyngeal  Phase Impairments: Cough -  Delayed Other Comments: delayed cough with sequential swallows - 4 ounce water test, good tolerance of small single boluses    Nectar Thick Nectar Thick Liquid: Not tested   Honey Thick Honey Thick Liquid: Not tested   Puree Puree: Within functional limits Presentation: Self Fed;Spoon   Solid   GO   Solid: Within functional limits Presentation: [place], [place] [facility] SLP [phone]

## 2015-09-07 NOTE — Progress Notes (Signed)
PROGRESS NOTE    Bailey [name]  [id number] DOB: 12/13/1926 DOA: 09/05/2015 PCP: Estill Dooms, MD  Outpatient Specialists:    Assessment & Plan:   Principal Problem:   Hypotension Active Problems:   Hyponatremia   Senile osteoporosis   Hypothyroidism   Pure hypercholesterolemia   Essential hypertension   CKD (chronic kidney disease) stage 2, GFR 60-89 ml/min   GERD (gastroesophageal reflux disease)   Constipation   Chronic pain syndrome   Pulmonary embolus (HCC)   AKI (acute kidney injury) (HCC)   Hyperkalemia   Anemia   Chronic hyponatremia   Dysphagia  #1 hypotension/orthostatic hypotension Likely secondary to diuretics and antihypertensive medications. Clinical improvement and improvement with orthostasis. Patient still with some complaints of dizziness versus unsteady gait. Continue to hold antihypertensive medications and diuretics. IV fluids. Supportive care. Follow.  #2 acute on chronic hyponatremia Likely secondary to hypovolemic hyponatremia. Improved with hydration. Continue to hold ARB and diuretics. NSL IVF. Follow.  #3 hypothyroidism Continue Synthroid.  #4 acute kidney injury on chronic kidney disease stage II Improving with hydration and holding nephrotoxic agents.  #5 gastroesophageal reflux disease PPI.  #6 history of PE Continue chronic anticoagulation with Eliquis.  #7 constipation Continue bowel regimen.  #8 anemia Anemia panel consistent with anemia of chronic disease. Follow H&H.  #9 hyperkalemia Resolved.  #10 sinusitis Will place on Levaquin 250 twice a day 5 days.  #11 dysphagia SLP evaluation.   DVT prophylaxis: On eliquis Code Status: DO NOT RESUSCITATE Family Communication: Updated patient. No family at bedside. Disposition Plan: SNF once orthostatic hypotension/hypertension has resolved and patient tolerating oral intake, hopefully tomorrow.   Consultants:   None  Procedures:   Chest x-ray  09/05/2015  Antimicrobials:   None   Subjective: Patient states some improvement with dizziness. Patient denies any shortness of breath. No chest pain. Patient c/o sinus drainage and pressure. Patient complaining of some difficulty swallowing with medication being stuck in the throat and subsequently went down.  Objective: Filed Vitals:   09/06/15 1306 09/06/15 2105 09/07/15 0432 09/07/15 0930  BP:  130/64 173/76 152/68  Pulse:  56 66   Temp: 97.5 F (36.4 C) 97.9 F (36.6 C) 98 F (36.7 C)   TempSrc:  Oral Oral   Resp:  13 16   Height:      Weight:   62.3 kg (137 lb 5.6 oz)   SpO2: 100% 100% 98%     Intake/Output Summary (Last 24 hours) at 09/07/15 1145 Last data filed at 09/07/15 0700  Gross per 24 hour  Intake 2163.75 ml  Output      0 ml  Net 2163.75 ml   Filed Weights   09/05/15 1754 09/06/15 0519 09/07/15 0432  Weight: 60.9 kg (134 lb 4.2 oz) 58.5 kg (128 lb 15.5 oz) 62.3 kg (137 lb 5.6 oz)    Examination:  General exam: Appears calm and comfortable  Respiratory system: Clear to auscultation. Respiratory effort normal. Cardiovascular system: S1 & S2 heard, RRR. No JVD, murmurs, rubs, gallops or clicks. No pedal edema. Gastrointestinal system: Abdomen is nondistended, soft and nontender. No organomegaly or masses felt. Normal bowel sounds heard. Central nervous system: Alert and oriented. No focal neurological deficits. Extremities: Symmetric 5 x 5 power. Skin: No rashes, lesions or ulcers Psychiatry: Judgement and insight appear normal. Mood & affect appropriate.     Data Reviewed: I have personally reviewed following labs and imaging studies  CBC:  Recent Labs Lab 09/05/15 1234 09/06/15 0516 09/07/15  0452  WBC 6.7 5.4 4.5  NEUTROABS 5.2  --   --   HGB 10.7* 10.9* 9.8*  HCT 29.8* 30.9* 27.8*  MCV 91.4 93.9 95.5  PLT 150 160 [id number]*   Basic Metabolic Panel:  Recent Labs Lab 09/05/15 1234 09/05/15 1704 09/06/15 0516 09/07/15 0452  NA 123*   --  133* 134*  K 5.2*  --  4.9 4.3  CL 92*  --  102 106  CO2 24  --  23 19*  GLUCOSE 86  --  102* 98  BUN 43*  --  31* 26*  CREATININE 1.44*  --  1.32* 1.23*  CALCIUM 8.6*  --  8.6* 8.1*  MG  --  2.2  --   --    GFR: Estimated Creatinine Clearance: 29.5 mL/min (by C-G formula based on Cr of 1.23). Liver Function Tests:  Recent Labs Lab 09/05/15 1234  AST 19  ALT 14  ALKPHOS 54  BILITOT 0.9  PROT 6.6  ALBUMIN 3.7    Recent Labs Lab 09/05/15 1234  LIPASE 38   No results for input(s): AMMONIA in the last 168 hours. Coagulation Profile: No results for input(s): INR, PROTIME in the last 168 hours. Cardiac Enzymes:  Recent Labs Lab 09/05/15 1234  TROPONINI <0.03   BNP (last 3 results) No results for input(s): PROBNP in the last 8760 hours. HbA1C: No results for input(s): HGBA1C in the last 72 hours. CBG:  Recent Labs Lab 09/06/15 0733 09/07/15 0736  GLUCAP 88 91   Lipid Profile: No results for input(s): CHOL, HDL, LDLCALC, TRIG, CHOLHDL, LDLDIRECT in the last 72 hours. Thyroid Function Tests:  Recent Labs  09/06/15 0518  TSH 1.150   Anemia Panel:  Recent Labs  09/05/15 1704  VITAMINB12 1380*  FOLATE 23.4  FERRITIN 124  TIBC 265  IRON 75  RETICCTPCT 1.4   Urine analysis:    Component Value Date/Time   COLORURINE YELLOW 09/05/2015 1409   APPEARANCEUR CLEAR 09/05/2015 1409   LABSPEC 1.007 09/05/2015 1409   PHURINE 7.5 09/05/2015 1409   GLUCOSEU NEGATIVE 09/05/2015 1409   HGBUR NEGATIVE 09/05/2015 1409   BILIRUBINUR NEGATIVE 09/05/2015 1409   KETONESUR NEGATIVE 09/05/2015 1409   PROTEINUR NEGATIVE 09/05/2015 1409   UROBILINOGEN 1.0 03/22/2015 1200   NITRITE NEGATIVE 09/05/2015 1409   LEUKOCYTESUR NEGATIVE 09/05/2015 1409   Sepsis Labs: @LABRCNTIP (procalcitonin:4,lacticidven:4)  )No results found for this or any previous visit (from the past 240 hour(s)).       Radiology Studies: Dg Chest 2 View  09/05/2015  CLINICAL DATA:   Dyspnea, hypotension history of pulmonary emboli EXAM: CHEST  2 VIEW COMPARISON:  CT 05/07/2015 FINDINGS: Normal cardiac silhouette. Lungs are hyperinflated. No effusion, infiltrate or pneumothorax. There is compression fractures in the mid thoracic spine. There vertebroplasty augmentation in mid thoracic spinal or thoracic spine IMPRESSION: Hyperinflated lungs with no acute findings Chronic compression fractures in the thoracic spine Electronically Signed   By: Suzy Bouchard M.D.   On: 09/05/2015 13:01        Scheduled Meds: . apixaban  5 mg Oral BID  . cholecalciferol  3,000 Units Oral Daily  . doxazosin  4 mg Oral Daily  . feeding supplement  1 Container Oral Q supper  . levothyroxine  112 mcg Oral QAC breakfast  . loratadine  10 mg Oral Daily  . oxyCODONE-acetaminophen  1 tablet Oral QID  . pantoprazole  40 mg Oral Daily  . senna-docusate  2 tablet Oral QHS  . sodium chloride  flush  3 mL Intravenous Q12H  . vitamin B-12  1,000 mcg Oral Daily   Continuous Infusions:     LOS: 1 day    Time spent: 65 mins    THOMPSON,DANIEL, MD [facility] Pager [phone]  If 7PM-7AM, please contact night-coverage [email or web address] Password [facility] 09/07/2015, 11:45 AM

## 2015-09-07 NOTE — Progress Notes (Signed)
CSW continuing to follow.   PT/OT recommending SNF as pt min assist. Pt met inpatient criteria on 09/06/15.   CSW spoke with pt at bedside and received permission to contact pt son, Derald Macleod.  CSW contacted pt son, Derald Macleod via telephone. CSW introduced self and explained role. CSW discussed with pt son re: recommendations for SNF. CSW explained that pt met inpatient criteria on 09/06/15 and if pt discharged before 09/09/15 then pt stay at [facility] will be private pay due to pt not having 3 night inpatient stay for Medicare coverage for SNF. Pt son discussed that he feels rehab will be more beneficial to pt as pt does not actively ambulate at [facility] and [facility] does not provide min assist to pt. Pt son hopeful that pt will need continued treatment in order to have 3 night inpatient stay for Medicare coverage for SNF, but states private pay can be managed if needed for pt to go to rehab section at [facility].  Pt son had questions regarding pt medical diagnosis and treatment. CSW notified MD of pt son request for MD to contact pt son re: update.   CSW to update [place] [facility] tomorrow and update pt and pt son tomorrow.  Alison Murray, MSW, [place] Work [phone]

## 2015-09-07 NOTE — Evaluation (Signed)
Occupational Therapy Evaluation Patient Details Name: Bailey [name] MRN: [id number] DOB: 1926/06/21 Today's Date: 09/07/2015    History of Present Illness Bailey [name] is a 82 y.o. female with medical history significant of osteoporosis, hypothyroidism, chronic hyponatremia with baseline sodium approximately 1:30 to 131, vitamin D deficiency, hyperlipidemia, hypertension, history of PE on chronic anticoagulation, chronic kidney disease stage II who presents to the ED after dizziness and a presyncopal episode   Clinical Impression   Pt admitted s/p dizziness and presyncopal episode. Pt currently with functional limitations due to the deficits listed below (see OT Problem List). Pt will benefit from skilled OT to increase their safety and independence with ADL and functional mobility for ADL to facilitate discharge to venue listed below.      Follow Up Recommendations  SNF    Equipment Recommendations  None recommended by OT    Recommendations for Other Services       Precautions / Restrictions Precautions Precautions: Fall      Mobility Bed Mobility Overal bed mobility: Needs Assistance Bed Mobility: Supine to Sit;Sit to Supine     Supine to sit: Min guard Sit to supine: Min guard      Transfers Overall transfer level: Needs assistance Equipment used: None   Sit to Stand: Min guard                   ADL Overall ADL's : Needs assistance/impaired Eating/Feeding: Set up   Grooming: Set up;Sitting   Upper Body Bathing: Set up;Sitting   Lower Body Bathing: Minimal assistance;Sit to/from stand   Upper Body Dressing : Set up;Sitting   Lower Body Dressing: Minimal assistance;Sit to/from stand       Toileting- Water quality scientist and Hygiene: Minimal assistance;Sit to/from stand       Functional mobility during ADLs: Minimal assistance;Cueing for sequencing;Rolling walker;Cueing for safety                 Pertinent Vitals/Pain Pain Assessment:  No/denies pain     Hand Dominance Right   Extremity/Trunk Assessment Upper Extremity Assessment Upper Extremity Assessment: Generalized weakness           Communication Communication Communication: No difficulties   Cognition Arousal/Alertness: Awake/alert Behavior During Therapy: WFL for tasks assessed/performed Overall Cognitive Status: Within Functional Limits for tasks assessed                                Home Living Family/patient expects to be discharged to:: Other (Comment) ([place] independent apartment with her husband ) Living Arrangements: Spouse/significant other                               Additional Comments: Pt recently in "hospital care/SNF at [facility]" area due to nbot feeling well and plans to trasnition back there before she trasnitions back to her apartment.       Prior Functioning/Environment Level of Independence: Independent with assistive device(s)        Comments: uses RW to ambulate in apratmetn and to dining hall     OT Diagnosis: Generalized weakness   OT Problem List: Decreased strength;Decreased activity tolerance   OT Treatment/Interventions: Self-care/ADL training;DME and/or AE instruction;Patient/family education    OT Goals(Current goals can be found in the care plan section) Acute Rehab OT Goals Patient Stated Goal: I want to make it back to my apartement  eventually  OT Goal Formulation: With patient Time For Goal Achievement: 09/14/15 Potential to Achieve Goals: Good  OT Frequency: Min 2X/week   Barriers to D/C: Decreased caregiver support             End of Session Equipment Utilized During Treatment: Rolling walker Nurse Communication: Mobility status  Activity Tolerance: Patient tolerated treatment well Patient left: in bed;with call bell/phone within reach;with bed alarm set   Time: [id number] OT Time Calculation (min): 15 min Charges:  OT General Charges $OT Visit: 1  Procedure OT Evaluation $OT Eval Moderate Complexity: 1 Procedure G-Codes:    [name] D Oct 06, 2015, 2:45 PM

## 2015-09-07 NOTE — Telephone Encounter (Signed)
Patient son, Derald Macleod called back and stated that his questions were never answered. Wants to know about a referral to the rheumatologist for her arthritis and also wants to know about a possible anti-inflammatory medication so she does not have to take so much of her pain medication. Please Advise.  [name] #: [phone]

## 2015-09-08 ENCOUNTER — Inpatient Hospital Stay (HOSPITAL_COMMUNITY): Payer: Medicare Other

## 2015-09-08 DIAGNOSIS — R531 Weakness: Secondary | ICD-10-CM | POA: Insufficient documentation

## 2015-09-08 LAB — GLUCOSE, CAPILLARY: GLUCOSE-CAPILLARY: 101 mg/dL — AB (ref 65–99)

## 2015-09-08 LAB — BASIC METABOLIC PANEL
Anion gap: 8 (ref 5–15)
BUN: 19 mg/dL (ref 6–20)
CHLORIDE: 104 mmol/L (ref 101–111)
CO2: 22 mmol/L (ref 22–32)
CREATININE: 1.12 mg/dL — AB (ref 0.44–1.00)
Calcium: 8.6 mg/dL — ABNORMAL LOW (ref 8.9–10.3)
GFR calc non Af Amer: 43 mL/min — ABNORMAL LOW (ref >60)
GFR, EST AFRICAN AMERICAN: 50 mL/min — AB (ref >60)
Glucose, Bld: 104 mg/dL — ABNORMAL HIGH (ref 65–99)
Potassium: 4.2 mmol/L (ref 3.5–5.1)
SODIUM: 134 mmol/L — AB (ref 135–145)

## 2015-09-08 MED ORDER — LEVOFLOXACIN 500 MG PO TABS
250.0000 mg | ORAL_TABLET | Freq: Every day | ORAL | Status: DC
  Administered 2015-09-09: 250 mg via ORAL
  Filled 2015-09-08: qty 1

## 2015-09-08 MED ORDER — SODIUM CHLORIDE 0.9 % IV SOLN
INTRAVENOUS | Status: DC
  Administered 2015-09-08 – 2015-09-09 (×3): via INTRAVENOUS

## 2015-09-08 MED ORDER — MECLIZINE HCL 25 MG PO TABS
12.5000 mg | ORAL_TABLET | Freq: Once | ORAL | Status: AC
  Administered 2015-09-08: 12.5 mg via ORAL
  Filled 2015-09-08: qty 1

## 2015-09-08 MED ORDER — MECLIZINE HCL 25 MG PO TABS: 25.0000 mg | ORAL_TABLET | Freq: Three times a day (TID) | ORAL | Status: DC | PRN

## 2015-09-08 MED ORDER — SODIUM CHLORIDE 0.9 % IV SOLN: INTRAVENOUS | Status: DC

## 2015-09-08 MED ORDER — SODIUM CHLORIDE 0.9 % IV BOLUS (SEPSIS)
500.0000 mL | Freq: Once | INTRAVENOUS | Status: AC
  Administered 2015-09-08: 500 mL via INTRAVENOUS

## 2015-09-08 NOTE — Progress Notes (Addendum)
Physical Therapy Treatment Patient Details Name: Bailey [name] MRN: [id number] DOB: [birth year] Today's Date: 09/08/2015    History of Present Illness 82 y.o. female with medical history significant of osteoporosis, hypothyroidism, chronic hyponatremia with baseline sodium approximately 1:30 to 131, vitamin D deficiency, hyperlipidemia, hypertension, history of PE on chronic anticoagulation, chronic kidney disease stage II who presents to the ED after dizziness and a presyncopal episode    PT Comments    Progressing with mobility. Pt denied dizziness on today. Fatigues fairly easily with increased activity. Pt is not back to baseline.   Addendum: No spinning or dizziness reported during session today. Do not feel vestibular eval is warranted.     Follow Up Recommendations  SNF     Equipment Recommendations  None recommended by PT    Recommendations for Other Services       Precautions / Restrictions Precautions Precautions: Fall Restrictions Weight Bearing Restrictions: No    Mobility  Bed Mobility Overal bed mobility: Needs Assistance Bed Mobility: Supine to Sit     Supine to sit: Supervision        Transfers Overall transfer level: Needs assistance Equipment used: Rolling walker (2 wheeled) Transfers: Sit to/from Stand Sit to Stand: Min guard         General transfer comment: close guard for safety  Ambulation/Gait Ambulation/Gait assistance: Min guard Ambulation Distance (Feet): 140 Feet Assistive device: Rolling walker (2 wheeled) Gait Pattern/deviations: Step-through pattern;Decreased stride length     General Gait Details: slow gait speed. close guard for safety. Pt denied dizziness. dyspnea 2/4. O2 sats 99%, HR 88 bpm. Fatigued after walk.   Stairs            Wheelchair Mobility    Modified Rankin (Stroke Patients Only)       Balance Overall balance assessment: Needs assistance         Standing balance support: Bilateral upper  extremity supported;During functional activity Standing balance-Leahy Scale: Poor Standing balance comment: used RW                    Cognition Arousal/Alertness: Awake/alert Behavior During Therapy: WFL for tasks assessed/performed Overall Cognitive Status: Within Functional Limits for tasks assessed                      Exercises      General Comments        Pertinent Vitals/Pain Pain Assessment: No/denies pain    Home Living                      Prior Function            PT Goals (current goals can now be found in the care plan section) Progress towards PT goals: Progressing toward goals    Frequency  Min 3X/week    PT Plan Current plan remains appropriate    Co-evaluation             End of Session Equipment Utilized During Treatment: Gait belt Activity Tolerance: Patient limited by fatigue Patient left: in chair;with call bell/phone within reach     Time: 1100-1125 PT Time Calculation (min) (ACUTE ONLY): 25 min  Charges:  $Gait Training: 23-37 mins                    G Codes:      Weston Anna, MPT Pager: [phone]

## 2015-09-08 NOTE — Progress Notes (Signed)
CSW continuing to follow.   Pt not yet medically ready for discharge.  CSW updated [place] SNF and facility can accept pt when medically ready for discharge.   Pt will meet 3-night inpatient stay for Medicare to cover SNF.   CSW spoke with pt at bedside and pt son, Derald Macleod via telephone today.  CSW to continue to follow to assist with pt transfer to [facility] SNF when medically stable for discharge.   Alison Murray, MSW, [place] Work [phone]

## 2015-09-08 NOTE — Progress Notes (Signed)
PROGRESS NOTE    GENICE BLUMER  [id number] DOB: 1926/09/18 DOA: 09/05/2015 PCP: Estill Dooms, MD  Outpatient Specialists:    Assessment & Plan:   Principal Problem:   Hypotension Active Problems:   Hyponatremia   Senile osteoporosis   Hypothyroidism   Pure hypercholesterolemia   Essential hypertension   CKD (chronic kidney disease) stage 2, GFR 60-89 ml/min   GERD (gastroesophageal reflux disease)   Constipation   Chronic pain syndrome   Pulmonary embolus (HCC)   AKI (acute kidney injury) (HCC)   Hyperkalemia   Anemia   Chronic hyponatremia   Dysphagia  #1 hypotension/orthostatic hypotension Likely secondary to diuretics and antihypertensive medications. Clinical improvement and improvement with orthostasis yesterday however patient significantly orthostatic today with complaints of significant dizziness. Will check MRI of the head. PT for vestibular evaluation. Meclizine 12.5 mg by mouth 1. Will discontinue patient's Cardura. Placed back on IV fluids. TED hose. Supportive care.  #2 acute on chronic hyponatremia Likely secondary to hypovolemic hyponatremia. Improved with hydration. Continue to hold ARB and diuretics. NSL IVF. Follow.  #3 hypothyroidism Continue Synthroid.  #4 acute kidney injury on chronic kidney disease stage II Improving with hydration and holding nephrotoxic agents.  #5 gastroesophageal reflux disease PPI.  #6 history of PE Continue chronic anticoagulation with Eliquis.  #7 constipation Continue bowel regimen.  #8 anemia Anemia panel consistent with anemia of chronic disease. Follow H&H.  #9 hyperkalemia Resolved.  #10 sinusitis Continue Levaquin.   #11 dysphagia Patient has been assessed by speech therapy were recommending regular diet with thin liquids.   DVT prophylaxis: On eliquis Code Status: DO NOT RESUSCITATE Family Communication: Updated patient. No family at bedside. Disposition Plan: SNF once orthostatic  hypotension/hypertension has resolved and patient tolerating oral intake.   Consultants:   None  Procedures:   Chest x-ray 09/05/2015  Antimicrobials:   Oral Levaquin 09/07/2015   Subjective: Patient states feeling very dizzy when orthostatics was checked. Patient denies any chest pain. No shortness of breath.  Objective: Filed Vitals:   09/07/15 1445 09/07/15 2125 09/08/15 0611 09/08/15 1018  BP: 121/60 181/88 168/81 153/73  Pulse: 66 68 69   Temp: 97.8 F (36.6 C) 98 F (36.7 C) 98 F (36.7 C)   TempSrc: Oral Oral Oral   Resp: 14 16 18    Height:      Weight:   59.829 kg (131 lb 14.4 oz)   SpO2: 99% 100% 100%     Intake/Output Summary (Last 24 hours) at 09/08/15 1302 Last data filed at 09/08/15 0124  Gross per 24 hour  Intake    483 ml  Output      0 ml  Net    483 ml   Filed Weights   09/06/15 0519 09/07/15 0432 09/08/15 0611  Weight: 58.5 kg (128 lb 15.5 oz) 62.3 kg (137 lb 5.6 oz) 59.829 kg (131 lb 14.4 oz)    Examination:  General exam: Appears calm and comfortable  Respiratory system: Clear to auscultation. Respiratory effort normal. Cardiovascular system: S1 & S2 heard, RRR. No JVD, murmurs, rubs, gallops or clicks. No pedal edema. Gastrointestinal system: Abdomen is nondistended, soft and nontender. No organomegaly or masses felt. Normal bowel sounds heard. Central nervous system: Alert and oriented. No focal neurological deficits. Extremities: Symmetric 5 x 5 power. Skin: No rashes, lesions or ulcers Psychiatry: Judgement and insight appear normal. Mood & affect appropriate.     Data Reviewed: I have personally reviewed following labs and imaging studies  CBC:  Recent Labs Lab 09/05/15 1234 09/06/15 0516 09/07/15 0452  WBC 6.7 5.4 4.5  NEUTROABS 5.2  --   --   HGB 10.7* 10.9* 9.8*  HCT 29.8* 30.9* 27.8*  MCV 91.4 93.9 95.5  PLT 150 160 [id number]*   Basic Metabolic Panel:  Recent Labs Lab 09/05/15 1234 09/05/15 1704 09/06/15 0516  09/07/15 0452 09/08/15 0504  NA 123*  --  133* 134* 134*  K 5.2*  --  4.9 4.3 4.2  CL 92*  --  102 106 104  CO2 24  --  23 19* 22  GLUCOSE 86  --  102* 98 104*  BUN 43*  --  31* 26* 19  CREATININE 1.44*  --  1.32* 1.23* 1.12*  CALCIUM 8.6*  --  8.6* 8.1* 8.6*  MG  --  2.2  --   --   --    GFR: Estimated Creatinine Clearance: 32.4 mL/min (by C-G formula based on Cr of 1.12). Liver Function Tests:  Recent Labs Lab 09/05/15 1234  AST 19  ALT 14  ALKPHOS 54  BILITOT 0.9  PROT 6.6  ALBUMIN 3.7    Recent Labs Lab 09/05/15 1234  LIPASE 38   No results for input(s): AMMONIA in the last 168 hours. Coagulation Profile: No results for input(s): INR, PROTIME in the last 168 hours. Cardiac Enzymes:  Recent Labs Lab 09/05/15 1234  TROPONINI <0.03   BNP (last 3 results) No results for input(s): PROBNP in the last 8760 hours. HbA1C: No results for input(s): HGBA1C in the last 72 hours. CBG:  Recent Labs Lab 09/06/15 0733 09/07/15 0736 09/08/15 0751  GLUCAP 88 91 101*   Lipid Profile: No results for input(s): CHOL, HDL, LDLCALC, TRIG, CHOLHDL, LDLDIRECT in the last 72 hours. Thyroid Function Tests:  Recent Labs  09/06/15 0518  TSH 1.150   Anemia Panel:  Recent Labs  09/05/15 1704  VITAMINB12 1380*  FOLATE 23.4  FERRITIN 124  TIBC 265  IRON 75  RETICCTPCT 1.4   Urine analysis:    Component Value Date/Time   COLORURINE YELLOW 09/05/2015 1409   APPEARANCEUR CLEAR 09/05/2015 1409   LABSPEC 1.007 09/05/2015 1409   PHURINE 7.5 09/05/2015 1409   GLUCOSEU NEGATIVE 09/05/2015 1409   HGBUR NEGATIVE 09/05/2015 1409   BILIRUBINUR NEGATIVE 09/05/2015 1409   KETONESUR NEGATIVE 09/05/2015 1409   PROTEINUR NEGATIVE 09/05/2015 1409   UROBILINOGEN 1.0 03/22/2015 1200   NITRITE NEGATIVE 09/05/2015 1409   LEUKOCYTESUR NEGATIVE 09/05/2015 1409   Sepsis Labs: @LABRCNTIP (procalcitonin:4,lacticidven:4)  )No results found for this or any previous visit (from the  past 240 hour(s)).       Radiology Studies: No results found.      Scheduled Meds: . apixaban  5 mg Oral BID  . cholecalciferol  3,000 Units Oral Daily  . doxazosin  4 mg Oral Daily  . feeding supplement  1 Container Oral Q supper  . fluticasone  2 spray Each Nare Daily  . levofloxacin  250 mg Oral BID  . levothyroxine  112 mcg Oral QAC breakfast  . loratadine  10 mg Oral Daily  . meclizine  12.5 mg Oral Once  . oxyCODONE-acetaminophen  1 tablet Oral QID  . pantoprazole  40 mg Oral Daily  . senna-docusate  2 tablet Oral QHS  . sodium chloride flush  3 mL Intravenous Q12H  . vitamin B-12  1,000 mcg Oral Daily   Continuous Infusions:     LOS: 2 days    Time spent: 35 mins  Irine Seal, MD [facility] Pager [phone]  If 7PM-7AM, please contact night-coverage [email or web address] Password [email or web address] 09/08/2015, 1:02 PM

## 2015-09-09 DIAGNOSIS — N182 Chronic kidney disease, stage 2 (mild): Secondary | ICD-10-CM

## 2015-09-09 DIAGNOSIS — I951 Orthostatic hypotension: Secondary | ICD-10-CM

## 2015-09-09 DIAGNOSIS — R2681 Unsteadiness on feet: Secondary | ICD-10-CM | POA: Diagnosis not present

## 2015-09-09 DIAGNOSIS — N289 Disorder of kidney and ureter, unspecified: Secondary | ICD-10-CM | POA: Diagnosis not present

## 2015-09-09 DIAGNOSIS — E871 Hypo-osmolality and hyponatremia: Secondary | ICD-10-CM | POA: Diagnosis not present

## 2015-09-09 DIAGNOSIS — N179 Acute kidney failure, unspecified: Principal | ICD-10-CM

## 2015-09-09 DIAGNOSIS — K219 Gastro-esophageal reflux disease without esophagitis: Secondary | ICD-10-CM

## 2015-09-09 DIAGNOSIS — R55 Syncope and collapse: Secondary | ICD-10-CM | POA: Diagnosis not present

## 2015-09-09 DIAGNOSIS — R531 Weakness: Secondary | ICD-10-CM | POA: Diagnosis not present

## 2015-09-09 DIAGNOSIS — M810 Age-related osteoporosis without current pathological fracture: Secondary | ICD-10-CM | POA: Diagnosis not present

## 2015-09-09 DIAGNOSIS — M6281 Muscle weakness (generalized): Secondary | ICD-10-CM | POA: Diagnosis not present

## 2015-09-09 DIAGNOSIS — I10 Essential (primary) hypertension: Secondary | ICD-10-CM

## 2015-09-09 DIAGNOSIS — I959 Hypotension, unspecified: Secondary | ICD-10-CM | POA: Diagnosis not present

## 2015-09-09 DIAGNOSIS — K5900 Constipation, unspecified: Secondary | ICD-10-CM

## 2015-09-09 DIAGNOSIS — E039 Hypothyroidism, unspecified: Secondary | ICD-10-CM | POA: Diagnosis not present

## 2015-09-09 DIAGNOSIS — I2692 Saddle embolus of pulmonary artery without acute cor pulmonale: Secondary | ICD-10-CM | POA: Diagnosis not present

## 2015-09-09 DIAGNOSIS — M150 Primary generalized (osteo)arthritis: Secondary | ICD-10-CM | POA: Diagnosis not present

## 2015-09-09 DIAGNOSIS — R1312 Dysphagia, oropharyngeal phase: Secondary | ICD-10-CM | POA: Diagnosis not present

## 2015-09-09 DIAGNOSIS — I2699 Other pulmonary embolism without acute cor pulmonale: Secondary | ICD-10-CM

## 2015-09-09 DIAGNOSIS — G894 Chronic pain syndrome: Secondary | ICD-10-CM | POA: Diagnosis not present

## 2015-09-09 LAB — CBC
HEMATOCRIT: 29.8 % — AB (ref 36.0–46.0)
HEMOGLOBIN: 10.6 g/dL — AB (ref 12.0–15.0)
MCH: 34.1 pg — ABNORMAL HIGH (ref 26.0–34.0)
MCHC: 35.6 g/dL (ref 30.0–36.0)
MCV: 95.8 fL (ref 78.0–100.0)
Platelets: 143 10*3/uL — ABNORMAL LOW (ref 150–400)
RBC: 3.11 MIL/uL — ABNORMAL LOW (ref 3.87–5.11)
RDW: 12.6 % (ref 11.5–15.5)
WBC: 5.2 10*3/uL (ref 4.0–10.5)

## 2015-09-09 LAB — BASIC METABOLIC PANEL
ANION GAP: 7 (ref 5–15)
BUN: 20 mg/dL (ref 6–20)
CALCIUM: 8.5 mg/dL — AB (ref 8.9–10.3)
CHLORIDE: 106 mmol/L (ref 101–111)
CO2: 23 mmol/L (ref 22–32)
CREATININE: 1.19 mg/dL — AB (ref 0.44–1.00)
GFR calc Af Amer: 47 mL/min — ABNORMAL LOW (ref >60)
GFR calc non Af Amer: 40 mL/min — ABNORMAL LOW (ref >60)
GLUCOSE: 101 mg/dL — AB (ref 65–99)
Potassium: 4.4 mmol/L (ref 3.5–5.1)
Sodium: 136 mmol/L (ref 135–145)

## 2015-09-09 LAB — GLUCOSE, CAPILLARY: Glucose-Capillary: 98 mg/dL (ref 65–99)

## 2015-09-09 MED ORDER — ZOLPIDEM TARTRATE 5 MG PO TABS: ORAL_TABLET | ORAL | Status: DC

## 2015-09-09 MED ORDER — OXYCODONE-ACETAMINOPHEN 7.5-325 MG PO TABS: ORAL_TABLET | ORAL | Status: DC

## 2015-09-09 MED ORDER — IRBESARTAN 150 MG PO TABS: ORAL_TABLET | ORAL | Status: DC

## 2015-09-09 MED ORDER — LEVOFLOXACIN 250 MG PO TABS: 250.0000 mg | ORAL_TABLET | Freq: Every day | ORAL | Status: DC

## 2015-09-09 MED ORDER — LIDOCAINE 5 % EX PTCH
3.0000 | MEDICATED_PATCH | CUTANEOUS | Status: DC
Start: 2015-09-09 — End: 2015-09-09
  Administered 2015-09-09: 3 via TRANSDERMAL
  Filled 2015-09-09: qty 3

## 2015-09-09 NOTE — Progress Notes (Signed)
Gave report to Manuela Schwartz, RN at [facility]. Left number if she had additional questions.

## 2015-09-09 NOTE — Discharge Summary (Signed)
Physician Discharge Summary  EVELEAN WEINBERG [id number] DOB: 05/04/1927 DOA: 09/05/2015  PCP: Estill Dooms, MD  Admit date: 09/05/2015 Discharge date: 09/09/2015  Time spent: 40 minutes  Recommendations for Outpatient Follow-up:  1. Follow-up with nursing home M.D. 2. Discontinued Cardura, atenolol, Lasix and Aldactone, Avapro dose decreased from 300 to 150 mg.   Discharge Diagnoses:  Principal Problem:   Hypotension Active Problems:   Senile osteoporosis   Hypothyroidism   Pure hypercholesterolemia   Essential hypertension   CKD (chronic kidney disease) stage 2, GFR 60-89 ml/min   GERD (gastroesophageal reflux disease)   Constipation   Chronic pain syndrome   Pulmonary embolus (HCC)   Hyponatremia   AKI (acute kidney injury) ([place])   Hyperkalemia   Anemia   Chronic hyponatremia   Dysphagia   Weakness generalized   Discharge Condition: Stable  Diet recommendation: Heart healthy  Filed Weights   09/07/15 0432 09/08/15 0611 09/09/15 0458  Weight: 62.3 kg (137 lb 5.6 oz) 59.829 kg (131 lb 14.4 oz) 59.92 kg (132 lb 1.6 oz)    History of present illness:  Bailey [name] is a 82 y.o. female with medical history significant of osteoporosis, hypothyroidism, chronic hyponatremia with baseline sodium approximately 1:30 to 131, vitamin D deficiency, hyperlipidemia, hypertension, history of PE on chronic anticoagulation, chronic kidney disease stage II who presents to the ED after dizziness and a presyncopal episode. Patient states that she got up, dressed was washing her hands when she 7 felt dizzy and made her way to a chair and sat down. Patient stated that she felt like she nearly passed out. Patient did have some associated shortness of breath. Patient denies any chest pain, no palpitations, no fever, no abdominal pain, no dysuria, no diarrhea, no constipation, no melena, no hematemesis, no hematochezia. Patient does endorse a headache about 4 to a 5 out of 10, fatigue and  generalized weakness for a few weeks, some nausea, an episode of emesis one day prior to admission which has since resolved. Patient does endorse some cough which she attributes to her allergies. Patient stated she called the nurse at the nursing facility and initially systolic blood pressure was 50 and then when EMS got the systolic blood pressure was 80 patient was given a bolus of IV fluids and patient brought to the ED. ED Course: Patient seen in the ED was given a bolus of IV fluids. Compressive metabolic profile had a sodium of 123 potassium of 5.2 chloride of 92 BUN of 43 creatinine of 1.44 otherwise was within normal limits. Troponin was negative. BNP was 76.3. CBC had a hemoglobin of 10.7 otherwise was within normal limits. Urinalysis was unremarkable. Chest x-ray was negative for any acute infiltrate. EKG with normal sinus rhythm with prolonged PR interval. Orthostasis were checked and patient was noted to be orthostatic. [facility] were called to admit the patient for further evaluation and management.  Hospital Course:   Hypotension/orthostatic hypotension Likely secondary to diuretics and antihypertensive medications. She had evidence of dehydration as she did have ARF. No evidence of ACS, MRI showed no acute events. Vitals showing orthostasis on admission revealed Placed on IV fluids, patient is on Lasix and Aldactone both discontinued. Multiple blood pressure medications adjusted down, discontinued atenolol and Cardura, Avapro dose decreased to half. Seen by PT, recommended SNF.  Acute kidney injury on chronic kidney disease stage II Baseline creatinine is 1.0 from February 2017 presented with creatinine of 1.4. Likely secondary to volume depletion, diuretics and hypotension. This  is improved after IV fluid hydration, creatinine is 1.19 on discharge.  Acute on chronic hyponatremia Likely secondary to hypovolemic hyponatremia. This is resolved with IV fluid  hydration.  Hypothyroidism Continue Synthroid, TSH is normal.  Gastroesophageal reflux disease PPI.  History of PE Continue chronic anticoagulation with Eliquis.  Constipation Continue bowel regimen.  Anemia Anemia panel consistent with anemia of chronic disease. Follow H&H.  Hyperkalemia Resolved.  Sinusitis Continue Levaquin for 5 more days.   Dysphagia Patient has been assessed by speech therapy were recommending regular diet with thin liquids.    Procedures:  None  Consultations:  None  Discharge Exam: Filed Vitals:   09/08/15 2112 09/09/15 0459  BP: 167/84   Pulse: 76 82  Temp: 97.9 F (36.6 C) 97.8 F (36.6 C)  Resp: 18 20   General: Alert and awake, oriented x3, not in any acute distress. HEENT: anicteric sclera, pupils reactive to light and accommodation, EOMI CVS: S1-S2 clear, no murmur rubs or gallops Chest: clear to auscultation bilaterally, no wheezing, rales or rhonchi Abdomen: soft nontender, nondistended, normal bowel sounds, no organomegaly Extremities: no cyanosis, clubbing or edema noted bilaterally Neuro: Cranial nerves II-XII intact, no focal neurological deficits  Discharge Instructions   Discharge Instructions    Diet - low sodium heart healthy    Complete by:  As directed      Increase activity slowly    Complete by:  As directed           Current Discharge Medication List    START taking these medications   Details  levofloxacin (LEVAQUIN) 250 MG tablet Take 1 tablet (250 mg total) by mouth daily. Qty: 5 tablet, Refills: 0      CONTINUE these medications which have CHANGED   Details  irbesartan (AVAPRO) 150 MG tablet One daily to control BP      CONTINUE these medications which have NOT CHANGED   Details  apixaban (ELIQUIS) 5 MG TABS tablet Take 1 tablet (5 mg total) by mouth 2 (two) times daily. Qty: 60 tablet    cholecalciferol (VITAMIN D) 1000 UNITS tablet Take 3,000 Units by mouth daily.    Cyanocobalamin  (VITAMIN B-12 CR PO) Take 1 tablet by mouth daily.     denosumab (PROLIA) 60 MG/ML SOLN injection Inject 60 mg into the skin every 6 (six) months. Reported on 07/21/2015    feeding supplement (BOOST / RESOURCE BREEZE) LIQD Take 1 Container by mouth daily with supper.     levothyroxine (SYNTHROID, LEVOTHROID) 112 MCG tablet Take one tablet by mouth once daily for thyroid supplement Qty: 30 tablet, Refills: 3    Lidocaine (ASPERCREME LIDOCAINE) 4 % PTCH Apply 3 patches topically once. Apply 1 patch to the left hip, left knee, and the right shoulder in the morning. Remove in the evening. Qty: 90 patch, Refills: 5   Associated Diagnoses: Chronic pain syndrome    loratadine (CLARITIN) 10 MG tablet Take 10 mg by mouth daily.    omeprazole (PRILOSEC) 20 MG capsule Take 1 capsule (20 mg total) by mouth daily. Qty: 30 capsule, Refills: 3    oxyCODONE-acetaminophen (PERCOCET) 7.5-325 MG tablet Take one tablet at 10 AM, 1 PM, 8 PM, and 10 PM for pain control. May take an extra tablet between midnight and 6 AM if needed for pain control. Qty: 120 tablet, Refills: 0   Associated Diagnoses: Chronic pain syndrome    prochlorperazine (COMPAZINE) 10 MG tablet Take 1 tablet (10 mg total) by mouth every 6 (six) hours  as needed for nausea or vomiting. Qty: 20 tablet, Refills: 0    senna-docusate (SENOKOT S) 8.6-50 MG tablet 2 tablets nightly to prevent constipation Qty: 60 tablet, Refills: 5   Associated Diagnoses: Constipation, unspecified constipation type    zolpidem (AMBIEN) 5 MG tablet Take one tablet by mouth at bedtime for sleep Qty: 30 tablet, Refills: 5   Associated Diagnoses: Insomnia      STOP taking these medications     atenolol (TENORMIN) 25 MG tablet      doxazosin (CARDURA) 4 MG tablet      furosemide (LASIX) 40 MG tablet      spironolactone (ALDACTONE) 25 MG tablet        Allergies  Allergen Reactions  . Aspirin     Drop in body temp with large quantities, can tolerate low  doses of aspirin  . Penicillins Swelling    Has patient had a PCN reaction causing immediate rash, facial/tongue/throat swelling, SOB or lightheadedness with hypotension: No Has patient had a PCN reaction causing severe rash involving mucus membranes or skin necrosis: No Has patient had a PCN reaction that required hospitalization No Has patient had a PCN reaction occurring within the last 10 years: No If all of the above answers are "NO", then may proceed with Cephalosporin use.   Follow-up Information    Follow up with GREEN, [name], MD In 1 week.   Specialty:  Internal Medicine   Contact information:   [place] [address] [phone]        The results of significant diagnostics from this hospitalization (including imaging, microbiology, ancillary and laboratory) are listed below for reference.    Significant Diagnostic Studies: Dg Chest 2 View  09/05/2015  CLINICAL DATA:  Dyspnea, hypotension history of pulmonary emboli EXAM: CHEST  2 VIEW COMPARISON:  CT 05/07/2015 FINDINGS: Normal cardiac silhouette. Lungs are hyperinflated. No effusion, infiltrate or pneumothorax. There is compression fractures in the mid thoracic spine. There vertebroplasty augmentation in mid thoracic spinal or thoracic spine IMPRESSION: Hyperinflated lungs with no acute findings Chronic compression fractures in the thoracic spine Electronically Signed   By: Suzy Bouchard M.D.   On: 09/05/2015 13:01   Mr Brain Wo Contrast  09/08/2015  CLINICAL DATA:  Dizziness EXAM: MRI HEAD WITHOUT CONTRAST TECHNIQUE: Multiplanar, multiecho pulse sequences of the brain and surrounding structures were obtained without intravenous contrast. COMPARISON:  None. FINDINGS: Negative for acute infarct. Moderate chronic microvascular ischemic change in the white matter. No significant ischemic change in the brainstem or cerebellum. Moderate atrophy. Negative for intracranial hemorrhage. Negative for mass or edema.  No shift of the midline structures Pituitary normal in size. Mucosal edema paranasal sinuses. Air-fluid level right maxillary sinus. Normal orbit. IMPRESSION: Moderate atrophy.  Chronic microvascular ischemic change No acute intracranial abnormality Sinusitis with air-fluid level on the right. Electronically Signed   By: Franchot Gallo M.D.   On: 09/08/2015 21:02    Microbiology: No results found for this or any previous visit (from the past 240 hour(s)).   Labs: Basic Metabolic Panel:  Recent Labs Lab 09/05/15 1234 09/05/15 1704 09/06/15 0516 09/07/15 0452 09/08/15 0504 09/09/15 0507  NA 123*  --  133* 134* 134* 136  K 5.2*  --  4.9 4.3 4.2 4.4  CL 92*  --  102 106 104 106  CO2 24  --  23 19* 22 23  GLUCOSE 86  --  102* 98 104* 101*  BUN 43*  --  31* 26*  19 20  CREATININE 1.44*  --  1.32* 1.23* 1.12* 1.19*  CALCIUM 8.6*  --  8.6* 8.1* 8.6* 8.5*  MG  --  2.2  --   --   --   --    Liver Function Tests:  Recent Labs Lab 09/05/15 1234  AST 19  ALT 14  ALKPHOS 54  BILITOT 0.9  PROT 6.6  ALBUMIN 3.7    Recent Labs Lab 09/05/15 1234  LIPASE 38   No results for input(s): AMMONIA in the last 168 hours. CBC:  Recent Labs Lab 09/05/15 1234 09/06/15 0516 09/07/15 0452 09/09/15 0507  WBC 6.7 5.4 4.5 5.2  NEUTROABS 5.2  --   --   --   HGB 10.7* 10.9* 9.8* 10.6*  HCT 29.8* 30.9* 27.8* 29.8*  MCV 91.4 93.9 95.5 95.8  PLT 150 160 142* 143*   Cardiac Enzymes:  Recent Labs Lab 09/05/15 1234  TROPONINI <0.03   BNP: BNP (last 3 results)  Recent Labs  09/05/15 1234  BNP 76.3    ProBNP (last 3 results) No results for input(s): PROBNP in the last 8760 hours.  CBG:  Recent Labs Lab 09/06/15 0733 09/07/15 0736 09/08/15 0751 09/09/15 0740  GLUCAP 88 91 101* 98       Signed:  Reynolds Kittel A MD.  [facility] 09/09/2015, 9:57 AM

## 2015-09-09 NOTE — Progress Notes (Signed)
Pt for discharge to [facility] SN[facility].   CSW facilitated pt discharge needs including contacting facility, faxing pt discharge information via epic hub, discussing with pt at bedside, pt son and pt husband via telephone, providing RN phone number to call report, and arranging ambulance transport for pt to [facility] SNF scheduled for 3 pm pick up.   No further social work needs identified at this time.  CSW signing off.   Alison Murray, MSW, [place] Work [phone]

## 2015-09-09 NOTE — Care Management Important Message (Signed)
Important Message  Patient Details  Name: DYMON HAYE MRN: [id number] Date of Birth: 12-25-1926   Medicare Important Message Given:  Yes    Camillo Flaming 09/09/2015, 10:28 AMImportant Message  Patient Details  Name: LIERIN LISKA MRN: [id number] Date of Birth: 1926-10-07   Medicare Important Message Given:  Yes    Camillo Flaming 09/09/2015, 10:28 AM

## 2015-09-09 NOTE — Clinical Social Work Placement (Signed)
   CLINICAL SOCIAL WORK PLACEMENT  NOTE  Date:  09/09/2015  Patient Details  Name: SEMRA WENSEL MRN: [id number] Date of Birth: 1927/01/08  Clinical Social Work is seeking post-discharge placement for this patient at the [place] level of care (*CSW will initial, date and re-position this form in  chart as items are completed):  Yes   Patient/family provided with [facility] Department's list of facilities offering this level of care within the geographic area requested by the patient (or if unable, by the patient's family).  Yes   Patient/family informed of their freedom to choose among providers that offer the needed level of care, that participate in Medicare, Medicaid or managed care program needed by the patient, have an available bed and are willing to accept the patient.  Yes   Patient/family informed of [facility]'s ownership interest in [facility] and [facility], as well as of the fact that they are under no obligation to receive care at these facilities.  PASRR submitted to EDS on 09/07/15     PASRR number received on 09/07/15     Existing PASRR number confirmed on       FL2 transmitted to all facilities in geographic area requested by pt/family on 09/07/15     FL2 transmitted to all facilities within larger geographic area on       Patient informed that his/her managed care company has contracts with or will negotiate with certain facilities, including the following:        Yes   Patient/family informed of bed offers received.  Patient chooses bed at [facility]     Physician recommends and patient chooses bed at      Patient to be transferred to [facility] on 09/09/15.  Patient to be transferred to facility by ambulance [facility])     Patient family notified on 09/09/15 of transfer.  Name of family member notified:  pt notified at bedside and pt husband notified via telephone     PHYSICIAN        Additional Comment:    _______________________________________________ Ladell Pier, LCSW 09/09/2015, 2:33 PM

## 2015-09-09 NOTE — NC FL2 (Signed)
[place] LEVEL OF CARE SCREENING TOOL     IDENTIFICATION  Patient Name: Bailey [name] Birthdate: [birth year] Sex: female Admission Date (Current Location): 09/05/2015  [facility] and [place] Number:  Herbalist and Address:  [facility],  [place] [address] [place]      Provider Number: [id number]  Attending Physician Name and Address:  Verlee Monte, MD  Relative Name and Phone Number:       Current Level of Care: Hospital Recommended Level of Care: [place] Prior Approval Number:    Date Approved/Denied:   PASRR Number: [id number]  Discharge Plan: SNF    Current Diagnoses: Patient Active Problem List   Diagnosis Date Noted  . Weakness generalized   . Dysphagia 09/07/2015  . Hypotension 09/05/2015  . AKI (acute kidney injury) ([place]) 09/05/2015  . Hyperkalemia 09/05/2015  . Anemia 09/05/2015  . Chronic hyponatremia 09/05/2015  . Hyponatremia 05/26/2015  . Pulmonary embolus ([place]) 05/07/2015  . Chronic pain syndrome 04/16/2015  . Muscle spasm of right leg 03/31/2015  . Fracture of multiple pubic rami ([place]) 03/30/2015  . UTI (urinary tract infection) 03/27/2015  . GERD (gastroesophageal reflux disease) 03/24/2015  . Constipation 03/24/2015  . Fracture, intertrochanteric, right femur ([place])   . CKD (chronic kidney disease) stage 2, GFR 60-89 ml/min 11/18/2014  . Osteoarthritis 03/25/2014  . Osteoarthrosis, forearm   . Senile osteoporosis   . Malignant neoplasm of breast (female), unspecified site   . Insomnia   . Hypothyroidism   . Pure hypercholesterolemia   . Closed fracture of unspecified part of vertebral column without mention of spinal cord injury   . Essential hypertension   . Osteoarthrosis, hip   . Edema     Orientation RESPIRATION BLADDER Height & Weight     Self, Time, Situation, Place  Normal Continent Weight: 132 lb 1.6 oz (59.92 kg) Height:  5\' 5"  (165.1 cm)  BEHAVIORAL SYMPTOMS/MOOD  NEUROLOGICAL BOWEL NUTRITION STATUS      Continent Diet (Regular, thin liquids)  AMBULATORY STATUS COMMUNICATION OF NEEDS Skin   Limited Assist Verbally Normal                       Personal Care Assistance Level of Assistance  Bathing, Feeding, Dressing Bathing Assistance: Limited assistance Feeding assistance: Independent Dressing Assistance: Limited assistance     Functional Limitations Info             SPECIAL CARE FACTORS FREQUENCY  PT (By licensed PT), OT (By licensed OT)     PT Frequency: 5 x a week OT Frequency: 5 x a week            Contractures Contractures Info: Not present    Additional Factors Info  Code Status, Allergies Code Status Info: DNR Allergies Info: Aspirin, Penicillins           Current Medications (09/09/2015):  This is the current hospital active medication list Current Facility-Administered Medications  Medication Dose Route Frequency Provider Last Rate Last Dose  . 0.9 %  sodium chloride infusion   Intravenous Continuous Eugenie Filler, MD 75 mL/hr at 09/09/15 1035    . acetaminophen (TYLENOL) tablet 650 mg  650 mg Oral Q4H PRN Eugenie Filler, MD   650 mg at 09/09/15 0816  . apixaban (ELIQUIS) tablet 5 mg  5 mg Oral BID Eugenie Filler, MD   5 mg at 09/09/15 1035  . cholecalciferol (VITAMIN D) tablet 3,000 Units  3,000 Units Oral Daily Eugenie Filler, MD   3,000 Units at 09/09/15 1037  . feeding supplement (BOOST / RESOURCE BREEZE) liquid 1 Container  1 Container Oral Q supper Eugenie Filler, MD   1 Container at 09/08/15 1700  . fluticasone (FLONASE) 50 MCG/ACT nasal spray 2 spray  2 spray Each Nare Daily Eugenie Filler, MD   2 spray at 09/08/15 1018  . hydrALAZINE (APRESOLINE) injection 5 mg  5 mg Intravenous Q6H PRN Eugenie Filler, MD      . HYDROcodone-homatropine [facility]) 5-1.5 MG/5ML syrup 5 mL  5 mL Oral Q6H PRN Eugenie Filler, MD      . levofloxacin [place]) tablet 250 mg  250 mg Oral Daily Eugenie Filler, MD   250 mg at 09/09/15 1036  . levothyroxine (SYNTHROID, LEVOTHROID) tablet 112 mcg  112 mcg Oral QAC breakfast Eugenie Filler, MD   112 mcg at 09/09/15 [phone]  . loratadine (CLARITIN) tablet 10 mg  10 mg Oral Daily Eugenie Filler, MD   10 mg at 09/09/15 1035  . meclizine (ANTIVERT) tablet 25 mg  25 mg Oral TID PRN Irine Seal V, MD      . morphine 2 MG/ML injection 2 mg  2 mg Intravenous Q4H PRN Eugenie Filler, MD   2 mg at 09/08/15 0759  . ondansetron (ZOFRAN) tablet 4 mg  4 mg Oral Q6H PRN Eugenie Filler, MD       Or  . ondansetron [facility]) injection 4 mg  4 mg Intravenous Q6H PRN Eugenie Filler, MD      . oxyCODONE-acetaminophen (PERCOCET) 7.5-325 MG per tablet 1 tablet  1 tablet Oral QID Eugenie Filler, MD   1 tablet at 09/09/15 1035  . pantoprazole (PROTONIX) EC tablet 40 mg  40 mg Oral Daily Eugenie Filler, MD   40 mg at 09/09/15 1036  . senna-docusate (Senokot-S) tablet 2 tablet  2 tablet Oral QHS Eugenie Filler, MD   2 tablet at 09/08/15 2224  . sodium chloride flush (NS) 0.9 % injection 3 mL  3 mL Intravenous Q12H Eugenie Filler, MD   3 mL at 09/08/15 2225  . sorbitol 70 % solution 30 mL  30 mL Oral Daily PRN Eugenie Filler, MD      . vitamin B-12 (CYANOCOBALAMIN) tablet 1,000 mcg  1,000 mcg Oral Daily Irine Seal V, MD   1,000 mcg at 09/09/15 1036  . zolpidem (AMBIEN) tablet 5 mg  5 mg Oral QHS PRN Eugenie Filler, MD   5 mg at 09/08/15 2223     Discharge Medications: Please see discharge summary for a list of discharge medications.  Relevant Imaging Results:  Relevant Lab Results:   Additional Information SSN: [id number]  KIDD, [place], LCSW

## 2015-09-09 NOTE — Progress Notes (Signed)
Occupational Therapy Treatment Patient Details Name: Bailey [name] MRN: [id number] DOB: [birth year] Today's Date: 09/09/2015    History of present illness Bailey [name] is a 82 y.o. female with medical history significant of osteoporosis, hypothyroidism, chronic hyponatremia with baseline sodium approximately 1:30 to 131, vitamin D deficiency, hyperlipidemia, hypertension, history of PE on chronic anticoagulation, chronic kidney disease stage II who presents to the ED after dizziness and a presyncopal episode   OT comments  Could not tolerate BPPV testing completely.  Had difficulty looking up but no complaints of spinning during functional acitivities (but she was not in provoking positions).    Follow Up Recommendations  SNF    Equipment Recommendations  None recommended by OT    Recommendations for Other Services      Precautions / Restrictions Precautions Precautions: Fall Restrictions Weight Bearing Restrictions: No       Mobility Bed Mobility         Supine to sit: Supervision Sit to supine: Supervision      Transfers   Equipment used: Rolling walker (2 wheeled) Transfers: Sit to/from Stand Sit to Stand: Min guard         General transfer comment: close guard for safety    Balance                                   ADL                           Toilet Transfer: Min guard;Ambulation;BSC;RW   Toileting- Water quality scientist and Hygiene: Min guard;Sit to/from stand         General ADL Comments: Performed above task with close supervision for safety.  Pt still feels a sense of dysequilibrium.  She states she is not really spinning.  Attempted to check for BPPV, but pt was only able to tolerate lying on R side, and no nystagmus/symptoms present.  Pt has a h/o L hip sx and back sx and did not feel she could tolerate lying on L side nor going back onto back from long sitting.  Pt reported looking up was difficult, so I attemptedc to  check for BPPV based on this but only succeeded with 1/2 of test.       Vision                     Perception     Praxis      Cognition   Behavior During Therapy: [facility] for tasks assessed/performed Overall Cognitive Status: Within Functional Limits for tasks assessed                       Extremity/Trunk Assessment               Exercises     Shoulder Instructions       General Comments      Pertinent Vitals/ Pain       Pain Assessment: No/denies pain  Home Living                                          Prior Functioning/Environment              Frequency       Progress Toward Goals  OT Goals(current  goals can now be found in the care plan section)  Progress towards OT goals: Progressing toward goals     Plan      Co-evaluation                 End of Session     Activity Tolerance Patient tolerated treatment well   Patient Left in bed;with call bell/phone within reach;with bed alarm set   Nurse Communication          Time: [id number] OT Time Calculation (min): 20 min  Charges: OT General Charges $OT Visit: 1 Procedure OT Treatments $Self Care/Home Management : 8-22 mins  Misty Rago 09/09/2015, 1:50 PM  Lesle Chris, OTR/L [phone] 09/09/2015

## 2015-09-11 ENCOUNTER — Non-Acute Institutional Stay (SKILLED_NURSING_FACILITY): Payer: Medicare Other | Admitting: Internal Medicine

## 2015-09-11 ENCOUNTER — Encounter: Payer: Self-pay | Admitting: Internal Medicine

## 2015-09-11 DIAGNOSIS — G894 Chronic pain syndrome: Secondary | ICD-10-CM | POA: Diagnosis not present

## 2015-09-11 DIAGNOSIS — E871 Hypo-osmolality and hyponatremia: Secondary | ICD-10-CM | POA: Diagnosis not present

## 2015-09-11 DIAGNOSIS — R531 Weakness: Secondary | ICD-10-CM

## 2015-09-11 DIAGNOSIS — M150 Primary generalized (osteo)arthritis: Secondary | ICD-10-CM | POA: Diagnosis not present

## 2015-09-11 DIAGNOSIS — N182 Chronic kidney disease, stage 2 (mild): Secondary | ICD-10-CM

## 2015-09-11 DIAGNOSIS — M159 Polyosteoarthritis, unspecified: Secondary | ICD-10-CM

## 2015-09-11 DIAGNOSIS — I10 Essential (primary) hypertension: Secondary | ICD-10-CM | POA: Diagnosis not present

## 2015-09-11 NOTE — Telephone Encounter (Signed)
Patient was readmitted to the facility from the hospital today. I wrote an order to get her a rheumatology consult with Dr. Gavin Pound. I think facility will take care of this. I returned a telephone call to [facility].

## 2015-09-11 NOTE — Progress Notes (Signed)
History and physical  Location:  [facility] Room Number: N37 Place of Service:  SNF (31) Provider: Estill Dooms, MD  Patient Care Team: Estill Dooms, MD as PCP - General (Internal Medicine) Man Mast X, NP as Nurse Practitioner (Nurse Practitioner)  Extended Emergency Contact Information Primary Emergency Contact: Gaines,[name] Address: [phone] [place]          [place], [place] [address] [name] of [place] Phone: [phone] Mobile Phone: [phone] Relation: Spouse Secondary Emergency Contact: Essman,[name] Address: [address]          [place], [place] [address] [name] of [place] Mobile Phone: [phone] Relation: Son  Code Status:  DO NOT RESUSCITATE Goals of care: Advanced Directive information:  Advanced Directives 09/13/2015  Does patient have an advance directive? Yes  Type of Paramedic of [place];Living will;Out of facility DNR (pink MOST or yellow form)  Does patient want to make changes to advanced directive? No - Patient declined  Copy of advanced directive(s) in chart? -  Would patient like information on creating an advanced directive? No - patient declined information  Pre-existing out of facility DNR order (yellow form or pink MOST form) Yellow form placed in chart (order not valid for inpatient use)     Chief Complaint  Patient presents with  . Readmit To SNF    HPI:  Pt is a 82 y.o. female seen today for For readmission to [facility] at the SNF care level following hospitalization. Patient previously resided at the assisted living level of care, but she was hospitalized between 09/05/2015 and 09/09/2015. Patient was hypotensive at the time of admission. In the course of her hospital stay multiple medications were discontinued or reduced. These included discontinuation of atenolol, Cardura, Lasix, and spironolactone. Avapro was reduced to 150 mg daily. Patient was  also hyponatremic but this was correcting the time of discharge.  Patient is admitted to the SNF care level following her hospitalization for strengthening, training in safe mobility, and improved self-care skills. It is anticipated this will be a short-term rehabilitation admission with return to the assisted living area.  Patient has chronic pain syndrome related to her muscular and arthritic problems. Her son would like her to see a rheumatologist.  Since readmission to the facility, her systolic blood pressures in occasional diastolic blood pressure have been elevated. Latest blood pressures 4159/93, 168/88, and 144/87.  Although patient has had headaches in the past, she says she is not having that problem now.  She appears to be gaining strength rapidly with the assistance of physical therapy. Patient had become quite sedentary because of her pains and dizzy spells   Past Medical History  Diagnosis Date  . Osteoarthrosis, unspecified whether generalized or localized, forearm     bilaterally wrist  . Allergic rhinitis, cause unspecified   . Senile osteoporosis   . Malignant neoplasm of breast (female), unspecified site 1990    left. Had surgerey and chemo, but no radiation  . Acquired cyst of kidney     left  . Left cavernous carotid aneurysm 08/2008    1-1/42mm aneurysm of cavernous carotid artery  . Anemia, unspecified   . Unspecified gastritis and gastroduodenitis without mention of hemorrhage   . 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 Other and unspecified nonspecific immunological findings     positive ANA  . History of Epstein-Barr virus infection   .  Herpes simplex without mention of complication   . Insomnia, unspecified   . Unspecified hypothyroidism   . Hyposmolality and/or hyponatremia     07/13/15 Na 131  . Unspecified vitamin D deficiency   . Pure hypercholesterolemia   . Other B-complex deficiencies   .  Osteoarthrosis of knee     bilaterally  . Closed fracture of unspecified part of vertebral column without mention of spinal cord injury     T7 s/p kyphoplasty, T12  . Unspecified essential hypertension   . Pain in joint, site unspecified     severe, diffuse, chronic pain  . Osteoarthrosis, hip   . Edema 2015  . CKD stage 2 due to type 2 diabetes mellitus ([place]) 11/18/2014  . Neuropathy ([place])   . Positive ANA (antinuclear antibody)   . Insomnia   . Pulmonary embolism ([place]) 05/07/2015  . Chronic hyponatremia 09/05/2015   Past Surgical History  Procedure Laterality Date  . Dilation and curettage of uterus  X 2  . Middle ear surgery Bilateral 1938  . Mastectomy Left 04/29/1989  . Tonsillectomy  1968  . Nasal sinus surgery  1990 & 2000  . Laparoscopic cholecystectomy  09/20/2006  . Orif hip fracture Left 06/13/2007    following a syncopal episode  . Kyphoplasty  07/03/2010    T12  . Kyphoplasty      C7  . Femur im nail Right 03/18/2015    Procedure: INTRAMEDULLARY (IM) NAIL FEMORAL;  Surgeon: Rod Can, MD;  Location: [facility] ORS;  Service: Orthopedics;  Laterality: Right;  . Fracture surgery      Allergies  Allergen Reactions  . Aspirin     Drop in body temp with large quantities, can tolerate low doses of aspirin  . Penicillins Swelling    Has patient had a PCN reaction causing immediate rash, facial/tongue/throat swelling, SOB or lightheadedness with hypotension: No Has patient had a PCN reaction causing severe rash involving mucus membranes or skin necrosis: No Has patient had a PCN reaction that required hospitalization No Has patient had a PCN reaction occurring within the last 10 years: No If all of the above answers are "NO", then may proceed with Cephalosporin use.      Medication List       This list is accurate as of: 09/11/15  5:06 PM.  Always use your most recent med list.               apixaban 5 MG Tabs tablet  Commonly known as:  ELIQUIS  Take 1 tablet (5  mg total) by mouth 2 (two) times daily.     cholecalciferol 1000 units tablet  Commonly known as:  VITAMIN D  Take 3,000 Units by mouth daily.     denosumab 60 MG/ML Soln injection  Commonly known as:  PROLIA  Inject 60 mg into the skin every 6 (six) months. Reported on 07/21/2015     feeding supplement Liqd  Take 1 Container by mouth daily with supper.     AVAPRO 300 MG tablet  Generic drug:  irbesartan  Take 300 mg by mouth at bedtime.     irbesartan 150 MG tablet  Commonly known as:  AVAPRO  One daily to control BP     levofloxacin 250 MG tablet  Commonly known as:  LEVAQUIN  Take 1 tablet (250 mg total) by mouth daily.     levothyroxine 112 MCG tablet  Commonly known as:  SYNTHROID, LEVOTHROID  Take one tablet by mouth once daily  for thyroid supplement     Lidocaine 4 % Ptch  Commonly known as:  ASPERCREME LIDOCAINE  Apply 3 patches topically once. Apply 1 patch to the left hip, left knee, and the right shoulder in the morning. Remove in the evening.     loratadine 10 MG tablet  Commonly known as:  CLARITIN  Take 10 mg by mouth daily.     omeprazole 20 MG capsule  Commonly known as:  PRILOSEC  Take 1 capsule (20 mg total) by mouth daily.     oxyCODONE-acetaminophen 7.5-325 MG tablet  Commonly known as:  PERCOCET  Take one tablet at 10 AM, 1 PM, 8 PM, and 10 PM for pain control. May take an extra tablet between midnight and 6 AM if needed for pain control.     prochlorperazine 10 MG tablet  Commonly known as:  COMPAZINE  Take 1 tablet (10 mg total) by mouth every 6 (six) hours as needed for nausea or vomiting.     senna-docusate 8.6-50 MG tablet  Commonly known as:  SENOKOT S  2 tablets nightly to prevent constipation     VITAMIN B-12 CR PO  Take 1 tablet by mouth daily.     zolpidem 5 MG tablet  Commonly known as:  AMBIEN  Take one tablet by mouth at bedtime for sleep        Review of Systems  Constitutional: Negative for fever, chills and  diaphoresis.  HENT: Negative for congestion, ear discharge, ear pain, hearing loss, nosebleeds and tinnitus.        Sinus allergies  Eyes: Negative for photophobia and pain.  Respiratory: Positive for shortness of breath (On exertion). Negative for cough and wheezing.   Cardiovascular: Positive for leg swelling. Negative for chest pain and palpitations.       Reported BLE pain and edema. Venous doppler 06/04/15 showed no evidence of bilateral lower extremity deep venous thrombosis  Gastrointestinal: Positive for constipation. Negative for nausea, vomiting and abdominal pain.       Flatulence. Hemorrhoids.  Genitourinary: Negative.  Negative for dysuria, urgency and frequency.  Musculoskeletal: Positive for back pain and gait problem (using walker).       History of osteoporosis. Pain in the left knee and both hips.  Skin: Negative.  Negative for rash.       Right hip surgical incision healed  Neurological: Negative for dizziness, tremors and headaches.  Psychiatric/Behavioral: Negative for suicidal ideas and hallucinations. The patient is not nervous/anxious.     Immunization History  Administered Date(s) Administered  . Influenza Split 02/13/2013  . Influenza-Unspecified 02/27/2014, 02/12/2015  . Pneumococcal Polysaccharide-23 02/13/2013   Pertinent  Health Maintenance Due  Topic Date Due  . PNA vac Low Risk Adult (2 of 2 - PCV13) 02/13/2014  . MAMMOGRAM  09/09/2017 (Originally 03/21/1945)  . INFLUENZA VACCINE  12/15/2015  . DEXA SCAN  Completed   Fall Risk  06/30/2015 03/30/2015 11/18/2014 07/22/2014 10/22/2013  Falls in the past year? No Yes Yes No No  Number falls in past yr: - 2 or more 1 - -  Injury with Fall? - Yes No - -  Risk Factor Category  - High Fall Risk - - -  Risk for fall due to : - History of fall(s);Impaired balance/gait;Impaired mobility History of fall(s);Impaired balance/gait - -  Follow up - Falls evaluation completed Falls prevention discussed - -   Functional  Status Survey:    Filed Vitals:   09/11/15 1655  BP: 159/93  Pulse:  79  Temp: 97.1 F (36.2 C)  TempSrc: Oral  Resp: 18  Height: 5' 7.5" (1.715 m)  Weight: 133 lb (60.328 kg)  SpO2: 98%   Body mass index is 20.51 kg/(m^2). Physical Exam  Constitutional: She is oriented to person, place, and time. No distress.  Elderly. Frail. Slow in movement due to back pain.  HENT:  Right Ear: External ear normal.  Left Ear: External ear normal.  Nose: Nose normal.  Mouth/Throat: Oropharynx is clear and moist. No oropharyngeal exudate.  Eyes: Conjunctivae and EOM are normal. Pupils are equal, round, and reactive to light.  Neck: No JVD present. No tracheal deviation present. No thyromegaly present.  Cardiovascular: Normal rate, regular rhythm, normal heart sounds and intact distal pulses.  Exam reveals no gallop and no friction rub.   No murmur heard. Pulmonary/Chest: No respiratory distress. She has no wheezes. She has no rales. She exhibits no tenderness.  Abdominal: She exhibits no distension and no mass. There is no tenderness.  Musculoskeletal: Normal range of motion. She exhibits edema and tenderness (Lumbar area and left knee).  Right hip pain with movement, BLE edema 1+, mainly in ankles and feet. Unstable on standing. Uses walker to maintain balance.  Lymphadenopathy:    She has no cervical adenopathy.  Neurological: She is alert and oriented to person, place, and time. She has normal reflexes.  10/22/2013 MMSE 30/30. Passed clock drawing  Skin: No rash noted. No erythema. No pallor.  Right hip surgical incision healed.  Psychiatric: She has a normal mood and affect. Her behavior is normal. Judgment and thought content normal.    Labs reviewed:  Recent Labs  03/19/15 0415  09/05/15 1704  09/07/15 0452 09/08/15 0504 09/09/15 0507  NA 135  < >  --   < > 134* 134* 136  K 4.2  < >  --   < > 4.3 4.2 4.4  CL 107  < >  --   < > 106 104 106  CO2 22  < >  --   < > 19* 22 23    GLUCOSE 170*  < >  --   < > 98 104* 101*  BUN 24*  < >  --   < > 26* 19 20  CREATININE 1.05*  < >  --   < > 1.23* 1.12* 1.19*  CALCIUM 7.9*  < >  --   < > 8.1* 8.6* 8.5*  MG 2.0  --  2.2  --   --   --   --   < > = values in this interval not displayed.  Recent Labs  04/10/15 05/14/15 09/05/15 1234  AST 14 12* 19  ALT 8 8 14   ALKPHOS 95 62 54  BILITOT  --   --  0.9  PROT  --   --  6.6  ALBUMIN  --   --  3.7    Recent Labs  03/18/15 0237  09/05/15 1234 09/06/15 0516 09/07/15 0452 09/09/15 0507  WBC 9.9  < > 6.7 5.4 4.5 5.2  NEUTROABS 8.0*  --  5.2  --   --   --   HGB 11.8*  < > 10.7* 10.9* 9.8* 10.6*  HCT 34.1*  < > 29.8* 30.9* 27.8* 29.8*  MCV 92.9  < > 91.4 93.9 95.5 95.8  PLT 165  < > 150 160 142* 143*  < > = values in this interval not displayed. Lab Results  Component Value Date   TSH 1.150  09/06/2015   No results found for: HGBA1C Lab Results  Component Value Date   CHOL 175 11/10/2014   HDL 53 11/10/2014   LDLCALC 87 11/10/2014   TRIG 173* 11/10/2014    Significant Diagnostic Results in last 30 days:  Dg Chest 2 View  09/05/2015  CLINICAL DATA:  Dyspnea, hypotension history of pulmonary emboli EXAM: CHEST  2 VIEW COMPARISON:  CT 05/07/2015 FINDINGS: Normal cardiac silhouette. Lungs are hyperinflated. No effusion, infiltrate or pneumothorax. There is compression fractures in the mid thoracic spine. There vertebroplasty augmentation in mid thoracic spinal or thoracic spine IMPRESSION: Hyperinflated lungs with no acute findings Chronic compression fractures in the thoracic spine Electronically Signed   By: Suzy Bouchard M.D.   On: 09/05/2015 13:01    Assessment/Plan 1. Essential hypertension Blood pressure is been rising since discharge from the hospital. At this point, I think it is safer just to continue to monitor at as opposed to resuming any of the medications that were discontinued at the hospital.  2. Chronic hyponatremia Continue to monitor  3.  CKD (chronic kidney disease) stage 2, GFR 60-89 ml/min Continue to monitor  4. Chronic pain syndrome Treated with narcotics. Patient has been anemic and I have been reluctant to start her on NSAIDs.Marland Kitchen Anemia has improved and she is without heartburn or indigestion. She has a mild dysphagia.  5. Primary osteoarthritis involving multiple joints Inflammatory osteoarthritis with chronic pain-I have asked [facility] to arrange an appointment with a rheumatologist and suggested Dr. Gavin Pound.  6. Weakness generalized Regaining strength rapidly with the assistance of physical therapy.

## 2015-09-15 ENCOUNTER — Ambulatory Visit (INDEPENDENT_AMBULATORY_CARE_PROVIDER_SITE_OTHER): Payer: Medicare Other

## 2015-09-15 ENCOUNTER — Encounter: Payer: Medicare Other | Admitting: Internal Medicine

## 2015-09-15 DIAGNOSIS — M810 Age-related osteoporosis without current pathological fracture: Secondary | ICD-10-CM | POA: Diagnosis not present

## 2015-09-15 MED ORDER — DENOSUMAB 60 MG/ML ~~LOC~~ SOLN
60.0000 mg | Freq: Once | SUBCUTANEOUS | Status: AC
  Administered 2015-09-15: 60 mg via SUBCUTANEOUS

## 2015-09-22 ENCOUNTER — Encounter: Payer: Self-pay | Admitting: Nurse Practitioner

## 2015-09-22 ENCOUNTER — Non-Acute Institutional Stay: Payer: Medicare Other | Admitting: Nurse Practitioner

## 2015-09-22 DIAGNOSIS — M810 Age-related osteoporosis without current pathological fracture: Secondary | ICD-10-CM | POA: Diagnosis not present

## 2015-09-22 DIAGNOSIS — E039 Hypothyroidism, unspecified: Secondary | ICD-10-CM

## 2015-09-22 DIAGNOSIS — G4700 Insomnia, unspecified: Secondary | ICD-10-CM

## 2015-09-22 DIAGNOSIS — I10 Essential (primary) hypertension: Secondary | ICD-10-CM

## 2015-09-22 DIAGNOSIS — G894 Chronic pain syndrome: Secondary | ICD-10-CM

## 2015-09-22 DIAGNOSIS — K5900 Constipation, unspecified: Secondary | ICD-10-CM | POA: Diagnosis not present

## 2015-09-22 DIAGNOSIS — K219 Gastro-esophageal reflux disease without esophagitis: Secondary | ICD-10-CM | POA: Diagnosis not present

## 2015-09-22 NOTE — Assessment & Plan Note (Signed)
Stable, continue Senna S II qhs.    

## 2015-09-22 NOTE — Assessment & Plan Note (Signed)
Stable, continue Omeprazole 20mg daily.  

## 2015-09-22 NOTE — Assessment & Plan Note (Signed)
Stable, continue Zolpidem 5mg  nightly.

## 2015-09-22 NOTE — Assessment & Plan Note (Signed)
continue Synthroid 127mcg. 05/07/15 TSH 1.137

## 2015-09-22 NOTE — Assessment & Plan Note (Signed)
Permissive blood pressure control, continue Avapro 150mg  daily. Monitor BP

## 2015-09-22 NOTE — Assessment & Plan Note (Signed)
Continue Prolia q 6 months.

## 2015-09-22 NOTE — Assessment & Plan Note (Signed)
Continue Oxycodone/Acetaminophine 7.5/325 qid, daily prn.

## 2015-09-22 NOTE — Progress Notes (Signed)
Patient ID: Bailey [name], female   DOB: 30-Jan-1927, 82 y.o.   MRN: [id number]  Location:  [place] Room Number: AL 35 Place of Service: [place] Provider:  [facility] Mast NP  GREEN, [name], MD  Patient Care Team: Estill Dooms, MD as PCP - General (Internal Medicine) Man Mast X, NP as Nurse Practitioner (Nurse Practitioner)  Extended Emergency Contact Information Primary Emergency Contact: Steller,[name] Address: [phone] [place]          [place], [place] [address] [name] of [place] Phone: [phone] Mobile Phone: [phone] Relation: Spouse Secondary Emergency Contact: Consiglio,[name] Address: [address]          [place], [place] [address] [name] of [place] Mobile Phone: [phone] Relation: Son  Code Status:  DNR Goals of care: Advanced Directive information Advanced Directives 09/22/2015  Does patient have an advance directive? Yes  Type of Paramedic of [place];Living will;Out of facility DNR (pink MOST or yellow form)  Does patient want to make changes to advanced directive? No - Patient declined  Copy of advanced directive(s) in chart? Yes  Would patient like information on creating an advanced directive? -  Pre-existing out of facility DNR order (yellow form or pink MOST form) -     Chief Complaint  Patient presents with  . Acute Visit    Blood pressure concerns    HPI:  Pt is a 82 y.o. female seen today for medical management of chronic diseases.  Noted BP 78/48, nausea, clammy after am Atenolol 25mg  qd, Cardura 4mg  qd, Avapro 300mg  daily. She takes Furosemide 40mg , Spironolactone 25mg  for edema. Hx of PE: chronic Eliquis. Post op anemia, takes Vit B12 daily, last Hgb 11.7 05/28/15. Last TSH 1.137 05/07/15, takes Levothyroxine 121mcg daily for hypothyroidism. Sleeps and rests with aid of Ambien 5mg  nightly.    Past Medical History  Diagnosis Date  . Osteoarthrosis, unspecified whether  generalized or localized, forearm     bilaterally wrist  . Allergic rhinitis, cause unspecified   . Senile osteoporosis   . Malignant neoplasm of breast (female), unspecified site 1990    left. Had surgerey and chemo, but no radiation  . Acquired cyst of kidney     left  . Left cavernous carotid aneurysm 08/2008    1-1/7mm aneurysm of cavernous carotid artery  . Anemia, unspecified   . Unspecified gastritis and gastroduodenitis without mention of hemorrhage   . 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 Other and unspecified nonspecific immunological findings     positive ANA  . History of Epstein-Barr virus infection   . Herpes simplex without mention of complication   . Insomnia, unspecified   . Unspecified hypothyroidism   . Hyposmolality and/or hyponatremia     07/13/15 Na 131  . Unspecified vitamin D deficiency   . Pure hypercholesterolemia   . Other B-complex deficiencies   . Osteoarthrosis of knee     bilaterally  . Closed fracture of unspecified part of vertebral column without mention of spinal cord injury     T7 s/p kyphoplasty, T12  . Unspecified essential hypertension   . Pain in joint, site unspecified     severe, diffuse, chronic pain  . Osteoarthrosis, hip   . Edema 2015  . CKD stage 2 due to type 2 diabetes mellitus ([place]) 11/18/2014  . Neuropathy ([place])   . Positive ANA (antinuclear antibody)   .  Insomnia   . Pulmonary embolism ([place]) 05/07/2015  . Chronic hyponatremia 09/05/2015   Past Surgical History  Procedure Laterality Date  . Dilation and curettage of uterus  X 2  . Middle ear surgery Bilateral 1938  . Mastectomy Left 04/29/1989  . Tonsillectomy  1968  . Nasal sinus surgery  1990 & 2000  . Laparoscopic cholecystectomy  09/20/2006  . Orif hip fracture Left 06/13/2007    following a syncopal episode  . Kyphoplasty  07/03/2010    T12  . Kyphoplasty      C7  . Femur im nail Right 03/18/2015    Procedure:  INTRAMEDULLARY (IM) NAIL FEMORAL;  Surgeon: Rod Can, MD;  Location: [facility];  Service: Orthopedics;  Laterality: Right;  . Fracture surgery      Allergies  Allergen Reactions  . Aspirin     Drop in body temp with large quantities, can tolerate low doses of aspirin  . Penicillins Swelling    Has patient had a PCN reaction causing immediate rash, facial/tongue/throat swelling, SOB or lightheadedness with hypotension: No Has patient had a PCN reaction causing severe rash involving mucus membranes or skin necrosis: No Has patient had a PCN reaction that required hospitalization No Has patient had a PCN reaction occurring within the last 10 years: No If all of the above answers are "NO", then may proceed with Cephalosporin use.      Medication List       This list is accurate as of: 09/22/15  4:52 PM.  Always use your most recent med list.               apixaban 5 MG Tabs tablet  Commonly known as:  ELIQUIS  Take 1 tablet (5 mg total) by mouth 2 (two) times daily.     cholecalciferol 1000 units tablet  Commonly known as:  VITAMIN D  Take 3,000 Units by mouth daily.     denosumab 60 MG/ML Soln injection  Commonly known as:  PROLIA  Inject 60 mg into the skin every 6 (six) months. Reported on 07/21/2015     feeding supplement Liqd  Take 1 Container by mouth daily with supper.     irbesartan 150 MG tablet  Commonly known as:  AVAPRO  One daily to control BP     levothyroxine 112 MCG tablet  Commonly known as:  SYNTHROID, LEVOTHROID  Take one tablet by mouth once daily for thyroid supplement     Lidocaine 4 % Ptch  Commonly known as:  ASPERCREME LIDOCAINE  Apply 3 patches topically once. Apply 1 patch to the left hip, left knee, and the right shoulder in the morning. Remove in the evening.     loratadine 10 MG tablet  Commonly known as:  CLARITIN  Take 10 mg by mouth daily. Reported on 09/22/2015     omeprazole 20 MG capsule  Commonly known as:  PRILOSEC  Take 1  capsule (20 mg total) by mouth daily.     oxyCODONE-acetaminophen 7.5-325 MG tablet  Commonly known as:  PERCOCET  Take one tablet at 10 AM, 1 PM, 8 PM, and 10 PM for pain control. May take an extra tablet between midnight and 6 AM if needed for pain control.     prochlorperazine 10 MG tablet  Commonly known as:  COMPAZINE  Take 1 tablet (10 mg total) by mouth every 6 (six) hours as needed for nausea or vomiting.     senna-docusate 8.6-50 MG tablet  Commonly known as:  SENOKOT S  2 tablets nightly to prevent constipation     VITAMIN B-12 CR PO  Take 1,000 mcg by mouth daily.     zolpidem 5 MG tablet  Commonly known as:  AMBIEN  Take one tablet by mouth at bedtime for sleep        Review of Systems  Constitutional: Negative for fever, chills and diaphoresis.  HENT: Negative for congestion, ear discharge, ear pain, hearing loss, nosebleeds and tinnitus.        Sinus allergies  Eyes: Negative for photophobia and pain.  Respiratory: Positive for shortness of breath (On exertion). Negative for cough and wheezing.   Cardiovascular: Positive for leg swelling. Negative for chest pain and palpitations.       Reported BLE pain and edema. Venous doppler 06/04/15 showed no evidence of bilateral lower extremity deep venous thrombosis  Gastrointestinal: Positive for constipation. Negative for nausea, vomiting and abdominal pain.       Flatulence. Hemorrhoids.  Genitourinary: Negative.  Negative for dysuria, urgency and frequency.  Musculoskeletal: Positive for back pain and gait problem (using walker).       History of osteoporosis. Pain in the left knee and both hips.  Skin: Negative.  Negative for rash.       Right hip surgical incision healed  Neurological: Negative for dizziness, tremors and headaches.  Psychiatric/Behavioral: Negative for suicidal ideas and hallucinations. The patient is not nervous/anxious.     Immunization History  Administered Date(s) Administered  . Influenza  Split 02/13/2013  . Influenza-Unspecified 02/27/2014, 02/12/2015  . Pneumococcal Polysaccharide-23 02/13/2013   Pertinent  Health Maintenance Due  Topic Date Due  . PNA vac Low Risk Adult (2 of 2 - PCV13) 02/13/2014  . MAMMOGRAM  09/09/2017 (Originally 03/21/1945)  . INFLUENZA VACCINE  12/15/2015  . DEXA SCAN  Completed   Fall Risk  06/30/2015 03/30/2015 11/18/2014 07/22/2014 10/22/2013  Falls in the past year? No Yes Yes No No  Number falls in past yr: - 2 or more 1 - -  Injury with Fall? - Yes No - -  Risk Factor Category  - High Fall Risk - - -  Risk for fall due to : - History of fall(s);Impaired balance/gait;Impaired mobility History of fall(s);Impaired balance/gait - -  Follow up - Falls evaluation completed Falls prevention discussed - -   Functional Status Survey:    Filed Vitals:   09/22/15 1323  BP: 150/84  Pulse: 72  Resp: 18   There is no weight on file to calculate BMI. Physical Exam  Constitutional: She is oriented to person, place, and time. No distress.  Elderly. Frail. Slow in movement due to back pain.  HENT:  Right Ear: External ear normal.  Left Ear: External ear normal.  Nose: Nose normal.  Mouth/Throat: Oropharynx is clear and moist. No oropharyngeal exudate.  Eyes: Conjunctivae and EOM are normal. Pupils are equal, round, and reactive to light.  Neck: No JVD present. No tracheal deviation present. No thyromegaly present.  Cardiovascular: Normal rate, regular rhythm, normal heart sounds and intact distal pulses.  Exam reveals no gallop and no friction rub.   No murmur heard. Pulmonary/Chest: No respiratory distress. She has no wheezes. She has no rales. She exhibits no tenderness.  Abdominal: She exhibits no distension and no mass. There is no tenderness.  Musculoskeletal: Normal range of motion. She exhibits edema and tenderness (Lumbar area and left knee).  BLE edema 1+, mainly in ankles and feet. Unstable on standing. Uses walker to maintain  balance.    Lymphadenopathy:    She has no cervical adenopathy.  Neurological: She is alert and oriented to person, place, and time. She has normal reflexes.  10/22/2013 MMSE 30/30. Passed clock drawing  Skin: No rash noted. No erythema. No pallor.  Right hip surgical incision healed.  Psychiatric: She has a normal mood and affect. Her behavior is normal. Judgment and thought content normal.    Labs reviewed:  Recent Labs  03/19/15 0415  09/05/15 1704  09/07/15 0452 09/08/15 0504 09/09/15 0507  NA 135  < >  --   < > 134* 134* 136  K 4.2  < >  --   < > 4.3 4.2 4.4  CL 107  < >  --   < > 106 104 106  CO2 22  < >  --   < > 19* 22 23  GLUCOSE 170*  < >  --   < > 98 104* 101*  BUN 24*  < >  --   < > 26* 19 20  CREATININE 1.05*  < >  --   < > 1.23* 1.12* 1.19*  CALCIUM 7.9*  < >  --   < > 8.1* 8.6* 8.5*  MG 2.0  --  2.2  --   --   --   --   < > = values in this interval not displayed.  Recent Labs  04/10/15 05/14/15 09/05/15 1234  AST 14 12* 19  ALT 8 8 14   ALKPHOS 95 62 54  BILITOT  --   --  0.9  PROT  --   --  6.6  ALBUMIN  --   --  3.7    Recent Labs  03/18/15 0237  09/05/15 1234 09/06/15 0516 09/07/15 0452 09/09/15 0507  WBC 9.9  < > 6.7 5.4 4.5 5.2  NEUTROABS 8.0*  --  5.2  --   --   --   HGB 11.8*  < > 10.7* 10.9* 9.8* 10.6*  HCT 34.1*  < > 29.8* 30.9* 27.8* 29.8*  MCV 92.9  < > 91.4 93.9 95.5 95.8  PLT 165  < > 150 160 142* 143*  < > = values in this interval not displayed. Lab Results  Component Value Date   TSH 1.150 09/06/2015   No results found for: HGBA1C Lab Results  Component Value Date   CHOL 175 11/10/2014   HDL 53 11/10/2014   LDLCALC 87 11/10/2014   TRIG 173* 11/10/2014    Significant Diagnostic Results in last 30 days:  Dg Chest 2 View  09/05/2015  CLINICAL DATA:  Dyspnea, hypotension history of pulmonary emboli EXAM: CHEST  2 VIEW COMPARISON:  CT 05/07/2015 FINDINGS: Normal cardiac silhouette. Lungs are hyperinflated. No effusion, infiltrate or  pneumothorax. There is compression fractures in the mid thoracic spine. There vertebroplasty augmentation in mid thoracic spinal or thoracic spine IMPRESSION: Hyperinflated lungs with no acute findings Chronic compression fractures in the thoracic spine Electronically Signed   By: Suzy Bouchard M.D.   On: 09/05/2015 13:01   Mr Brain Wo Contrast  09/08/2015  CLINICAL DATA:  Dizziness EXAM: MRI HEAD WITHOUT CONTRAST TECHNIQUE: Multiplanar, multiecho pulse sequences of the brain and surrounding structures were obtained without intravenous contrast. COMPARISON:  None. FINDINGS: Negative for acute infarct. Moderate chronic microvascular ischemic change in the white matter. No significant ischemic change in the brainstem or cerebellum. Moderate atrophy. Negative for intracranial hemorrhage. Negative for mass or edema. No shift of the midline structures Pituitary  normal in size. Mucosal edema paranasal sinuses. Air-fluid level right maxillary sinus. Normal orbit. IMPRESSION: Moderate atrophy.  Chronic microvascular ischemic change No acute intracranial abnormality Sinusitis with air-fluid level on the right. Electronically Signed   By: Franchot Gallo M.D.   On: 09/08/2015 21:02    Assessment/Plan  Essential hypertension Permissive blood pressure control, continue Avapro 150mg  daily. Monitor BP    Chronic pain syndrome Continue Oxycodone/Acetaminophine 7.5/325 qid, daily prn.    Senile osteoporosis Continue Prolia q 6 months.   Hypothyroidism continue Synthroid 126mcg. 05/07/15 TSH 1.137    GERD (gastroesophageal reflux disease) Stable, continue Omeprazole 20mg  daily.    Constipation Stable, continue Senna S II qhs   Insomnia Stable, continue Zolpidem 5mg  nightly.      Family/ staff Communication: continue AL for care needs.   Labs/tests ordered: none

## 2015-09-30 ENCOUNTER — Other Ambulatory Visit: Payer: Self-pay

## 2015-09-30 DIAGNOSIS — G4700 Insomnia, unspecified: Secondary | ICD-10-CM

## 2015-09-30 MED ORDER — ZOLPIDEM TARTRATE 5 MG PO TABS: ORAL_TABLET | ORAL | Status: DC

## 2015-09-30 NOTE — Telephone Encounter (Signed)
RX faxed to [facility]

## 2015-10-07 DIAGNOSIS — B370 Candidal stomatitis: Secondary | ICD-10-CM | POA: Diagnosis not present

## 2015-10-07 DIAGNOSIS — M79642 Pain in left hand: Secondary | ICD-10-CM | POA: Diagnosis not present

## 2015-10-07 DIAGNOSIS — M79641 Pain in right hand: Secondary | ICD-10-CM | POA: Diagnosis not present

## 2015-10-07 DIAGNOSIS — M150 Primary generalized (osteo)arthritis: Secondary | ICD-10-CM | POA: Diagnosis not present

## 2015-10-07 DIAGNOSIS — M2550 Pain in unspecified joint: Secondary | ICD-10-CM | POA: Diagnosis not present

## 2015-10-07 DIAGNOSIS — G8921 Chronic pain due to trauma: Secondary | ICD-10-CM | POA: Diagnosis not present

## 2015-10-07 DIAGNOSIS — M810 Age-related osteoporosis without current pathological fracture: Secondary | ICD-10-CM | POA: Diagnosis not present

## 2015-10-07 DIAGNOSIS — R5383 Other fatigue: Secondary | ICD-10-CM | POA: Diagnosis not present

## 2015-10-20 ENCOUNTER — Non-Acute Institutional Stay: Payer: Medicare Other | Admitting: Internal Medicine

## 2015-10-20 ENCOUNTER — Encounter: Payer: Self-pay | Admitting: Internal Medicine

## 2015-10-20 VITALS — BP 164/98 | HR 66 | Temp 97.8°F | Ht 68.0 in | Wt 131.0 lb

## 2015-10-20 DIAGNOSIS — G4700 Insomnia, unspecified: Secondary | ICD-10-CM

## 2015-10-20 DIAGNOSIS — R609 Edema, unspecified: Secondary | ICD-10-CM

## 2015-10-20 DIAGNOSIS — M150 Primary generalized (osteo)arthritis: Secondary | ICD-10-CM

## 2015-10-20 DIAGNOSIS — G894 Chronic pain syndrome: Secondary | ICD-10-CM | POA: Diagnosis not present

## 2015-10-20 DIAGNOSIS — E871 Hypo-osmolality and hyponatremia: Secondary | ICD-10-CM | POA: Diagnosis not present

## 2015-10-20 DIAGNOSIS — I10 Essential (primary) hypertension: Secondary | ICD-10-CM | POA: Diagnosis not present

## 2015-10-20 DIAGNOSIS — M159 Polyosteoarthritis, unspecified: Secondary | ICD-10-CM

## 2015-10-20 MED ORDER — AMLODIPINE BESYLATE 5 MG PO TABS: ORAL_TABLET | ORAL | Status: DC

## 2015-10-20 MED ORDER — OXYCODONE-ACETAMINOPHEN 7.5-325 MG PO TABS
ORAL_TABLET | ORAL | Status: DC
Start: 2015-10-20 — End: 2015-11-26

## 2015-10-20 NOTE — Progress Notes (Signed)
Patient ID: Bailey [name], female   DOB: 1926/10/23, 82 y.o.   MRN: [id number]    [place] Room Number: AL35  Place of Service: Clinic (12)     Allergies  Allergen Reactions  . Aspirin     Drop in body temp with large quantities, can tolerate low doses of aspirin  . Penicillins Swelling    Has patient had a PCN reaction causing immediate rash, facial/tongue/throat swelling, SOB or lightheadedness with hypotension: No Has patient had a PCN reaction causing severe rash involving mucus membranes or skin necrosis: No Has patient had a PCN reaction that required hospitalization No Has patient had a PCN reaction occurring within the last 10 years: No If all of the above answers are "NO", then may proceed with Cephalosporin use.    Chief Complaint  Patient presents with  . Medical Management of Chronic Issues    follow up after discharge from [facility], unstable blood pressure, does very low, now high    HPI:  Essential hypertension - Most blood pressures. Patient was hypotensive during her last hospital stay so multiple medications that were holding her blood pressure and check were discontinued. Now blood pressures have risen again.  Insomnia - chronic condition. Patient has been on zolpidem for years. She recognizes that this may be a problem medication that could increase her risk of falling, particularly when used along with oxycodone.  Primary osteoarthritis involving multiple joints - multiple joint pains. Patient has been to see a rheumatologist who is going to rule out other inflammatory conditions. A follow-up appointment is scheduled with Dr. Lenna Gilford.  Chronic pain syndrome - patient has been on chronic narcotics for several years. This worries her son. It increases her risk for falling.  Chronic hyponatremia - sodium level was corrected to normal by April 2017  Edema, unspecified type - approximately 1+ bipedal and unchanged from previous  visits    Medications: Patient's Medications  New Prescriptions   No medications on file  Previous Medications   APIXABAN (ELIQUIS) 5 MG TABS TABLET    Take 1 tablet (5 mg total) by mouth 2 (two) times daily.   ATENOLOL (TENORMIN) 25 MG TABLET       CHOLECALCIFEROL (VITAMIN D) 1000 UNITS TABLET    Take 3,000 Units by mouth daily.   CLONIDINE (CATAPRES) 0.1 MG TABLET       CYANOCOBALAMIN (VITAMIN B-12 CR PO)    Take 1,000 mcg by mouth daily.    DENOSUMAB (PROLIA) 60 MG/ML SOLN INJECTION    Inject 60 mg into the skin every 6 (six) months. Reported on 07/21/2015   FEEDING SUPPLEMENT (BOOST / RESOURCE BREEZE) LIQD    Take 1 Container by mouth daily with supper.    IRBESARTAN (AVAPRO) 150 MG TABLET    One daily to control BP   LEVOTHYROXINE (SYNTHROID, LEVOTHROID) 112 MCG TABLET    Take one tablet by mouth once daily for thyroid supplement   LIDOCAINE (ASPERCREME LIDOCAINE) 4 % PTCH    Apply 3 patches topically once. Apply 1 patch to the left hip, left knee, and the right shoulder in the morning. Remove in the evening.   LORATADINE (CLARITIN) 10 MG TABLET    Take 10 mg by mouth daily. Reported on 09/22/2015   OMEPRAZOLE (PRILOSEC) 20 MG CAPSULE    Take 1 capsule (20 mg total) by mouth daily.   OXYCODONE-ACETAMINOPHEN (PERCOCET) 7.5-325 MG TABLET    Take one tablet at 10 AM, 1 PM, 8  PM, and 10 PM for pain control. May take an extra tablet between midnight and 6 AM if needed for pain control.   PROCHLORPERAZINE (COMPAZINE) 10 MG TABLET    Take 1 tablet (10 mg total) by mouth every 6 (six) hours as needed for nausea or vomiting.   SENNA-DOCUSATE (SENOKOT S) 8.6-50 MG TABLET    2 tablets nightly to prevent constipation   ZOLPIDEM (AMBIEN) 5 MG TABLET    Take one tablet by mouth at bedtime for sleep  Modified Medications   No medications on file  Discontinued Medications   No medications on file     Review of Systems  Constitutional: Negative for fever, chills and diaphoresis.  HENT: Negative for  congestion, ear discharge, ear pain, hearing loss, nosebleeds and tinnitus.        Sinus allergies  Eyes: Negative for photophobia and pain.  Respiratory: Positive for shortness of breath (On exertion). Negative for cough and wheezing.   Cardiovascular: Positive for leg swelling. Negative for chest pain and palpitations.       Reported BLE pain and edema. Venous doppler 06/04/15 showed no evidence of bilateral lower extremity deep venous thrombosis  Gastrointestinal: Positive for constipation. Negative for nausea, vomiting and abdominal pain.       Flatulence. Hemorrhoids.  Genitourinary: Negative.  Negative for dysuria, urgency and frequency.  Musculoskeletal: Positive for back pain and gait problem (using walker).       History of osteoporosis. Pain in the left knee and both hips.  Skin: Negative.  Negative for rash.       Right hip surgical incision healed  Neurological: Negative for dizziness, tremors and headaches.  Psychiatric/Behavioral: Negative for suicidal ideas and hallucinations. The patient is not nervous/anxious.     Filed Vitals:   10/20/15 1150  BP: 164/98  Pulse: 66  Temp: 97.8 F (36.6 C)  TempSrc: Oral  Height: _0  (1.727 m)  Weight: 131 lb (59.421 kg)  SpO2: 99%   Wt Readings from Last 3 Encounters:  10/20/15 131 lb (59.421 kg)  09/11/15 133 lb (60.328 kg)  09/09/15 132 lb 1.6 oz (59.92 kg)    Body mass index is 19.92 kg/(m^2).  Physical Exam  Constitutional: She is oriented to person, place, and time. No distress.  Elderly. Frail. Slow in movement due to back pain.  HENT:  Right Ear: External ear normal.  Left Ear: External ear normal.  Nose: Nose normal.  Mouth/Throat: Oropharynx is clear and moist. No oropharyngeal exudate.  Eyes: Conjunctivae and EOM are normal. Pupils are equal, round, and reactive to light.  Neck: No JVD present. No tracheal deviation present. No thyromegaly present.  Cardiovascular: Normal rate, regular rhythm, normal heart  sounds and intact distal pulses.  Exam reveals no gallop and no friction rub.   No murmur heard. Pulmonary/Chest: No respiratory distress. She has no wheezes. She has no rales. She exhibits no tenderness.  Abdominal: She exhibits no distension and no mass. There is no tenderness.  Musculoskeletal: Normal range of motion. She exhibits edema and tenderness (Lumbar area and left knee).  BLE edema 1+, mainly in ankles and feet. Unstable on standing. Uses walker to maintain balance.  Lymphadenopathy:    She has no cervical adenopathy.  Neurological: She is alert and oriented to person, place, and time. She has normal reflexes.  10/22/2013 MMSE 30/30. Passed clock drawing  Skin: No rash noted. No erythema. No pallor.  Right hip surgical incision healed.  Psychiatric: She has a normal mood  and affect. Her behavior is normal. Judgment and thought content normal.     Labs reviewed: Lab Summary Latest Ref Rng 09/09/2015 09/08/2015 09/07/2015 09/06/2015 09/05/2015  Hemoglobin 12.0 - 15.0 g/dL 10.6(L) (None) 9.8(L) 10.9(L) 10.7(L)  Hematocrit 36.0 - 46.0 % 29.8(L) (None) 27.8(L) 30.9(L) 29.8(L)  White count 4.0 - 10.5 K/uL 5.2 (None) 4.5 5.4 6.7  Platelet count 150 - 400 K/uL 143(L) (None) 142(L) 160 150  Sodium 135 - 145 mmol/L 136 134(L) 134(L) 133(L) 123(L)  Potassium 3.5 - 5.1 mmol/L 4.4 4.2 4.3 4.9 5.2(H)  Calcium 8.9 - 10.3 mg/dL 8.5(L) 8.6(L) 8.1(L) 8.6(L) 8.6(L)  Phosphorus - (None) (None) (None) (None) (None)  Creatinine 0.44 - 1.00 mg/dL 1.19(H) 1.12(H) 1.23(H) 1.32(H) 1.44(H)  AST 15 - 41 U/L (None) (None) (None) (None) 19  Alk Phos 38 - 126 U/L (None) (None) (None) (None) 54  Bilirubin 0.3 - 1.2 mg/dL (None) (None) (None) (None) 0.9  Glucose 65 - 99 mg/dL 101(H) 104(H) 98 102(H) 86  Cholesterol - (None) (None) (None) (None) (None)  HDL cholesterol - (None) (None) (None) (None) (None)  Triglycerides - (None) (None) (None) (None) (None)  LDL Direct - (None) (None) (None) (None) (None)   LDL Calc - (None) (None) (None) (None) (None)  Total protein 6.5 - 8.1 g/dL (None) (None) (None) (None) 6.6  Albumin 3.5 - 5.0 g/dL (None) (None) (None) (None) 3.7   Lab Results  Component Value Date   TSH 1.150 09/06/2015   Lab Results  Component Value Date   BUN 20 09/09/2015   BUN 19 09/08/2015   BUN 26* 09/07/2015   Lab Results  Component Value Date   CREATININE 1.19* 09/09/2015   CREATININE 1.12* 09/08/2015   CREATININE 1.23* 09/07/2015   No results found for: HGBA1C     Assessment/Plan  1. Essential hypertension Resume amlodipine 5 mg at bedtime. Resume atenolol 25 mg at bedtime  2. Insomnia Discontinue zolpidem  3. Primary osteoarthritis involving multiple joints See Dr. Lenna Gilford as planned  4. Chronic pain syndrome Continue Percocet 7.5/325 at 8 AM, 1 PM, 6 PM, and 10 PM. May have an additional tablet between the hours of 1 AM-5 AM if needed for pain control   5. Chronic hyponaremia resolved  6. Edema, unspecified type stable

## 2015-10-26 DIAGNOSIS — M150 Primary generalized (osteo)arthritis: Secondary | ICD-10-CM | POA: Diagnosis not present

## 2015-10-26 DIAGNOSIS — G8921 Chronic pain due to trauma: Secondary | ICD-10-CM | POA: Diagnosis not present

## 2015-10-26 DIAGNOSIS — M2550 Pain in unspecified joint: Secondary | ICD-10-CM | POA: Diagnosis not present

## 2015-10-26 DIAGNOSIS — M810 Age-related osteoporosis without current pathological fracture: Secondary | ICD-10-CM | POA: Diagnosis not present

## 2015-10-27 ENCOUNTER — Encounter: Payer: Self-pay | Admitting: Nurse Practitioner

## 2015-10-27 ENCOUNTER — Non-Acute Institutional Stay: Payer: Medicare Other | Admitting: Nurse Practitioner

## 2015-10-27 DIAGNOSIS — I10 Essential (primary) hypertension: Secondary | ICD-10-CM

## 2015-10-27 DIAGNOSIS — R609 Edema, unspecified: Secondary | ICD-10-CM | POA: Diagnosis not present

## 2015-10-27 DIAGNOSIS — B370 Candidal stomatitis: Secondary | ICD-10-CM | POA: Diagnosis not present

## 2015-10-27 DIAGNOSIS — G894 Chronic pain syndrome: Secondary | ICD-10-CM | POA: Diagnosis not present

## 2015-10-27 DIAGNOSIS — D500 Iron deficiency anemia secondary to blood loss (chronic): Secondary | ICD-10-CM

## 2015-10-27 DIAGNOSIS — E7800 Pure hypercholesterolemia, unspecified: Secondary | ICD-10-CM

## 2015-10-27 DIAGNOSIS — K219 Gastro-esophageal reflux disease without esophagitis: Secondary | ICD-10-CM

## 2015-10-27 DIAGNOSIS — H7092 Unspecified mastoiditis, left ear: Secondary | ICD-10-CM

## 2015-10-27 DIAGNOSIS — E039 Hypothyroidism, unspecified: Secondary | ICD-10-CM

## 2015-10-27 DIAGNOSIS — K5900 Constipation, unspecified: Secondary | ICD-10-CM | POA: Diagnosis not present

## 2015-10-27 NOTE — Assessment & Plan Note (Signed)
Not able to fall asleep until 2-3 am, hx of Ambien use, Son and the patient declined Ambien, thought it may cause her falling in the past.

## 2015-10-27 NOTE — Assessment & Plan Note (Addendum)
09/09/15 Hgb 10.6

## 2015-10-27 NOTE — Assessment & Plan Note (Signed)
Permissive blood pressure control, continue Avapro 150mg  daily, Atenolol 25mg , Amlodipine 5mg . Monitor BP

## 2015-10-27 NOTE — Assessment & Plan Note (Signed)
Stable, continue Senna S II qhs.    

## 2015-10-27 NOTE — Progress Notes (Signed)
Patient ID: Bailey [name], female   DOB: 04-20-1927, 82 y.o.   MRN: [id number]  Location:  [facility] Room Number: AL35 Place of Service: [place] Provider:  Lennie Odor Huong Luthi NP  GREEN, [name], MD  Patient Care Team: Estill Dooms, MD as PCP - General (Internal Medicine) Lorry Anastasi Otho Darner, NP as Nurse Practitioner (Nurse Practitioner)  Extended Emergency Contact Information Primary Emergency Contact: Magnan,[name] Address: [phone] [place]          [place], [place] [address] [name] of [place] Phone: [phone] Mobile Phone: [phone] Relation: Spouse Secondary Emergency Contact: Matteo,[name] Address: [address]          [place], [place] [address] [name] of [place] Mobile Phone: [phone] Relation: Son  Code Status:  DNR Goals of care: Advanced Directive information Advanced Directives 10/27/2015  Does patient have an advance directive? Yes  Type of Paramedic of [place];Living will;Out of facility DNR (pink MOST or yellow form)  Does patient want to make changes to advanced directive? No - Patient declined  Copy of advanced directive(s) in chart? Yes  Would patient like information on creating an advanced directive? No - patient declined information     Chief Complaint  Patient presents with  . Acute Visit    pain behind ear on rt side    HPI:  Pt is a 82 y.o. female seen today for medical management of chronic diseases. Blood pressure is elevated frequently, taking Atenolol 25mg  qd, Amlodipine 5mg , Avapro 15-mg daily, prn Clonidine. She is off diuretics, BLE edema persisted.  Hx of PE: chronic Eliquis. Post op anemia, takes Vit B12 daily, last Hgb 10.6 09/09/15, takes Levothyroxine 177mcg daily for hypothyroidism. TSH 1.15 09/06/15  Unable to sleep and rest wo Ambien, but her son and the patient declined Ambien, said it made her wobbly. Noted yellow coated tongue, c/o pain in the left mastoid bone, no  fever or swelling.    Past Medical History  Diagnosis Date  . Osteoarthrosis, unspecified whether generalized or localized, forearm     bilaterally wrist  . Allergic rhinitis, cause unspecified   . Senile osteoporosis   . Malignant neoplasm of breast (female), unspecified site 1990    left. Had surgerey and chemo, but no radiation  . Acquired cyst of kidney     left  . Left cavernous carotid aneurysm 08/2008    1-1/69mm aneurysm of cavernous carotid artery  . Anemia, unspecified   . Unspecified gastritis and gastroduodenitis without mention of hemorrhage   . 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 Other and unspecified nonspecific immunological findings     positive ANA  . History of Epstein-Barr virus infection   . Herpes simplex without mention of complication   . Insomnia, unspecified   . Unspecified hypothyroidism   . Hyposmolality and/or hyponatremia     07/13/15 Na 131  . Unspecified vitamin D deficiency   . Pure hypercholesterolemia   . Other B-complex deficiencies   . Osteoarthrosis of knee     bilaterally  . Closed fracture of unspecified part of vertebral column without mention of spinal cord injury     T7 s/p kyphoplasty, T12  . Unspecified essential hypertension   . Pain in joint, site unspecified     severe, diffuse, chronic pain  . Osteoarthrosis, hip   . Edema 2015  . CKD stage 2 due to type  2 diabetes mellitus ([facility]) 11/18/2014  . Neuropathy ([place])   . Positive ANA (antinuclear antibody)   . Insomnia   . Pulmonary embolism ([place]) 05/07/2015  . Chronic hyponatremia 09/05/2015   Past Surgical History  Procedure Laterality Date  . Dilation and curettage of uterus  X 2  . Middle ear surgery Bilateral 1938  . Mastectomy Left 04/29/1989  . Tonsillectomy  1968  . Nasal sinus surgery  1990 & 2000  . Laparoscopic cholecystectomy  09/20/2006  . Orif hip fracture Left 06/13/2007    following a syncopal episode  .  Kyphoplasty  07/03/2010    T12  . Kyphoplasty      C7  . Femur im nail Right 03/18/2015    Procedure: INTRAMEDULLARY (IM) NAIL FEMORAL;  Surgeon: Rod Can, MD;  Location: [facility];  Service: Orthopedics;  Laterality: Right;  . Fracture surgery      Allergies  Allergen Reactions  . Aspirin     Drop in body temp with large quantities, can tolerate low doses of aspirin  . Penicillins Swelling    Has patient had a PCN reaction causing immediate rash, facial/tongue/throat swelling, SOB or lightheadedness with hypotension: No Has patient had a PCN reaction causing severe rash involving mucus membranes or skin necrosis: No Has patient had a PCN reaction that required hospitalization No Has patient had a PCN reaction occurring within the last 10 years: No If all of the above answers are "NO", then may proceed with Cephalosporin use.      Medication List       This list is accurate as of: 10/27/15  3:48 PM.  Always use your most recent med list.               amLODipine 5 MG tablet  Commonly known as:  NORVASC  One daily to control BP     apixaban 5 MG Tabs tablet  Commonly known as:  ELIQUIS  Take 1 tablet (5 mg total) by mouth 2 (two) times daily.     atenolol 25 MG tablet  Commonly known as:  TENORMIN  Take 25 mg by mouth at bedtime.     cholecalciferol 1000 units tablet  Commonly known as:  VITAMIN D  Take 3,000 Units by mouth daily.     cloNIDine 0.1 MG tablet  Commonly known as:  CATAPRES     denosumab 60 MG/ML Soln injection  Commonly known as:  PROLIA  Inject 60 mg into the skin every 6 (six) months. Reported on 07/21/2015     feeding supplement Liqd  Take 1 Container by mouth daily with supper.     irbesartan 150 MG tablet  Commonly known as:  AVAPRO  One daily to control BP     levothyroxine 112 MCG tablet  Commonly known as:  SYNTHROID, LEVOTHROID  Take one tablet by mouth once daily for thyroid supplement     Lidocaine 4 % Ptch  Commonly known as:   ASPERCREME LIDOCAINE  Apply 3 patches topically once. Apply 1 patch to the left hip, left knee, and the right shoulder in the morning. Remove in the evening.     loratadine 10 MG tablet  Commonly known as:  CLARITIN  Take 10 mg by mouth daily. Reported on 09/22/2015     omeprazole 20 MG capsule  Commonly known as:  PRILOSEC  Take 1 capsule (20 mg total) by mouth daily.     oxyCODONE-acetaminophen 7.5-325 MG tablet  Commonly known as:  PERCOCET  Take  one tablet at 8 AM, 1 PM, 6 PM, and 10 PM for pain control. May take an extra tablet between 1AM and 5 AM if needed for pain control.     prochlorperazine 10 MG tablet  Commonly known as:  COMPAZINE  Take 1 tablet (10 mg total) by mouth every 6 (six) hours as needed for nausea or vomiting.     senna-docusate 8.6-50 MG tablet  Commonly known as:  SENOKOT S  2 tablets nightly to prevent constipation     VITAMIN B-12 CR PO  Take 1,000 mcg by mouth daily.        Review of Systems  Constitutional: Negative for fever, chills and diaphoresis.  HENT: Negative for congestion, ear discharge, ear pain, hearing loss, nosebleeds and tinnitus.        Sinus allergies. Left mastoid bone area pain when palpated.   Eyes: Negative for photophobia and pain.  Respiratory: Positive for shortness of breath (On exertion). Negative for cough and wheezing.   Cardiovascular: Positive for leg swelling. Negative for chest pain and palpitations.       Reported BLE pain and edema. Venous doppler 06/04/15 showed no evidence of bilateral lower extremity deep venous thrombosis  Gastrointestinal: Positive for constipation. Negative for nausea, vomiting and abdominal pain.       Flatulence. Hemorrhoids.  Genitourinary: Negative.  Negative for dysuria, urgency and frequency.  Musculoskeletal: Positive for back pain and gait problem (using walker).       History of osteoporosis. Pain in the left knee and both hips.  Skin: Negative.  Negative for rash.       Right hip  surgical incision healed  Neurological: Negative for dizziness, tremors and headaches.  Psychiatric/Behavioral: Negative for suicidal ideas and hallucinations. The patient is not nervous/anxious.     Immunization History  Administered Date(s) Administered  . Influenza Split 02/13/2013  . Influenza-Unspecified 02/27/2014, 02/12/2015  . Pneumococcal Polysaccharide-23 02/13/2013   Pertinent  Health Maintenance Due  Topic Date Due  . PNA vac Low Risk Adult (2 of 2 - PCV13) 02/13/2014  . MAMMOGRAM  09/09/2017 (Originally 03/21/1945)  . INFLUENZA VACCINE  12/15/2015  . DEXA SCAN  Completed   Fall Risk  10/20/2015 10/20/2015 06/30/2015 03/30/2015 11/18/2014  Falls in the past year? Yes No No Yes Yes  Number falls in past yr: 2 or more - - 2 or more 1  Injury with Fall? Yes - - Yes No  Risk Factor Category  - - - High Fall Risk -  Risk for fall due to : History of fall(s);Impaired balance/gait - - History of fall(s);Impaired balance/gait;Impaired mobility History of fall(s);Impaired balance/gait  Follow up - - - Falls evaluation completed Falls prevention discussed   Functional Status Survey:    Filed Vitals:   10/27/15 1434  BP: 158/88  Pulse: 56  Temp: 97.1 F (36.2 C)  Resp: 18  Height: 5\' 8"  (1.727 m)  Weight: 131 lb (59.421 kg)   Body mass index is 19.92 kg/(m^2). Physical Exam  Constitutional: She is oriented to person, place, and time. No distress.  Elderly. Frail. Slow in movement due to back pain.  HENT:  Right Ear: External ear normal.  Left Ear: External ear normal.  Nose: Nose normal.  Mouth/Throat: Oropharynx is clear and moist. No oropharyngeal exudate.  Left mastoid bone tenderness when palpated.   Eyes: Conjunctivae and EOM are normal. Pupils are equal, round, and reactive to light.  Neck: No JVD present. No tracheal deviation present. No thyromegaly present.  Cardiovascular: Normal rate, regular rhythm, normal heart sounds and intact distal pulses.  Exam reveals no  gallop and no friction rub.   No murmur heard. Pulmonary/Chest: No respiratory distress. She has no wheezes. She has no rales. She exhibits no tenderness.  Abdominal: She exhibits no distension and no mass. There is no tenderness.  Musculoskeletal: Normal range of motion. She exhibits edema and tenderness (Lumbar area and left knee).  BLE edema 1+, mainly in ankles and feet. Unstable on standing. Uses walker to maintain balance.  Lymphadenopathy:    She has no cervical adenopathy.  Neurological: She is alert and oriented to person, place, and time. She has normal reflexes.  10/22/2013 MMSE 30/30. Passed clock drawing  Skin: No rash noted. No erythema. No pallor.  Right hip surgical incision healed.  Psychiatric: She has a normal mood and affect. Her behavior is normal. Judgment and thought content normal.    Labs reviewed:  Recent Labs  03/19/15 0415  09/05/15 1704  09/07/15 0452 09/08/15 0504 09/09/15 0507  NA 135  < >  --   < > 134* 134* 136  K 4.2  < >  --   < > 4.3 4.2 4.4  CL 107  < >  --   < > 106 104 106  CO2 22  < >  --   < > 19* 22 23  GLUCOSE 170*  < >  --   < > 98 104* 101*  BUN 24*  < >  --   < > 26* 19 20  CREATININE 1.05*  < >  --   < > 1.23* 1.12* 1.19*  CALCIUM 7.9*  < >  --   < > 8.1* 8.6* 8.5*  MG 2.0  --  2.2  --   --   --   --   < > = values in this interval not displayed.  Recent Labs  04/10/15 05/14/15 09/05/15 1234  AST 14 12* 19  ALT 8 8 14   ALKPHOS 95 62 54  BILITOT  --   --  0.9  PROT  --   --  6.6  ALBUMIN  --   --  3.7    Recent Labs  03/18/15 0237  09/05/15 1234 09/06/15 0516 09/07/15 0452 09/09/15 0507  WBC 9.9  < > 6.7 5.4 4.5 5.2  NEUTROABS 8.0*  --  5.2  --   --   --   HGB 11.8*  < > 10.7* 10.9* 9.8* 10.6*  HCT 34.1*  < > 29.8* 30.9* 27.8* 29.8*  MCV 92.9  < > 91.4 93.9 95.5 95.8  PLT 165  < > 150 160 142* 143*  < > = values in this interval not displayed. Lab Results  Component Value Date   TSH 1.150 09/06/2015   No  results found for: HGBA1C Lab Results  Component Value Date   CHOL 175 11/10/2014   HDL 53 11/10/2014   LDLCALC 87 11/10/2014   TRIG 173* 11/10/2014    Significant Diagnostic Results in last 30 days:  No results found.  Assessment/Plan  Mastoiditis of left side Presumed clinically, will treat with Doxy 100mg  bid x 2 weeks. Monitor the patient.   Pure hypercholesterolemia Not able to fall asleep until 2-3 am, hx of Ambien use, Son and the patient declined Ambien, thought it may cause her falling in the past.   Edema Trace BLE edema, requested from staff at [facility] for venous doppler 06/04/15 showed no evidence of bilateral lower  extremity deep venous thrombosis, off Furosemide 40mg , Spironolactone 25mg  daily. 07/07/15 Na 131, K 4.5, Bun 28, creat 1.0     Chronic pain syndrome Continue Oxycodone/Acetaminophine 7.5/325 qid, daily prn.    Essential hypertension Permissive blood pressure control, continue Avapro 150mg  daily, Atenolol 25mg , Amlodipine 5mg . Monitor BP  Thrush, oral Magic mouth wash 70ml s/s ac and hs for 4 weeks.   Hypothyroidism continue Synthroid 144mcg. 05/07/15 TSH 1.137, 09/06/15 TSH 1.15   GERD (gastroesophageal reflux disease) Stable, continue Omeprazole 20mg  daily.   Constipation Stable, continue Senna S II qhs  Anemia 09/09/15 Hgb 10.6    Family/ staff Communication: continue AL for care needs.   Labs/tests ordered: none

## 2015-10-27 NOTE — Assessment & Plan Note (Signed)
Stable, continue Omeprazole 20mg daily.  

## 2015-10-27 NOTE — Assessment & Plan Note (Signed)
Magic mouth wash 60ml s/s ac and hs for 4 weeks.

## 2015-10-27 NOTE — Assessment & Plan Note (Signed)
Trace BLE edema, requested from staff at [facility] for venous doppler 06/04/15 showed no evidence of bilateral lower extremity deep venous thrombosis, off Furosemide 40mg , Spironolactone 25mg  daily. 07/07/15 Na 131, K 4.5, Bun 28, creat 1.0

## 2015-10-27 NOTE — Assessment & Plan Note (Addendum)
continue Synthroid 122mcg. 05/07/15 TSH 1.137, 09/06/15 TSH 1.15

## 2015-10-27 NOTE — Assessment & Plan Note (Signed)
Presumed clinically, will treat with Doxy 100mg  bid x 2 weeks. Monitor the patient.

## 2015-10-27 NOTE — Assessment & Plan Note (Signed)
Continue Oxycodone/Acetaminophine 7.5/325 qid, daily prn.

## 2015-11-10 ENCOUNTER — Encounter: Payer: Self-pay | Admitting: Internal Medicine

## 2015-11-10 ENCOUNTER — Non-Acute Institutional Stay: Payer: Medicare Other | Admitting: Internal Medicine

## 2015-11-10 VITALS — BP 124/68 | HR 54 | Temp 97.7°F | Ht 68.0 in | Wt 131.0 lb

## 2015-11-10 DIAGNOSIS — H7092 Unspecified mastoiditis, left ear: Secondary | ICD-10-CM

## 2015-11-10 DIAGNOSIS — D500 Iron deficiency anemia secondary to blood loss (chronic): Secondary | ICD-10-CM

## 2015-11-10 DIAGNOSIS — N182 Chronic kidney disease, stage 2 (mild): Secondary | ICD-10-CM

## 2015-11-10 DIAGNOSIS — G4700 Insomnia, unspecified: Secondary | ICD-10-CM | POA: Diagnosis not present

## 2015-11-10 DIAGNOSIS — G894 Chronic pain syndrome: Secondary | ICD-10-CM | POA: Diagnosis not present

## 2015-11-10 DIAGNOSIS — E871 Hypo-osmolality and hyponatremia: Secondary | ICD-10-CM

## 2015-11-10 DIAGNOSIS — I10 Essential (primary) hypertension: Secondary | ICD-10-CM

## 2015-11-10 DIAGNOSIS — R609 Edema, unspecified: Secondary | ICD-10-CM

## 2015-11-10 NOTE — Progress Notes (Signed)
Patient ID: Bailey [name], female   DOB: 02-21-1927, 82 y.o.   MRN: [id number]    [facility] Room Number: 35  Place of Service: Clinic (12)     Allergies  Allergen Reactions  . Aspirin     Drop in body temp with large quantities, can tolerate low doses of aspirin  . Penicillins Swelling    Has patient had a PCN reaction causing immediate rash, facial/tongue/throat swelling, SOB or lightheadedness with hypotension: No Has patient had a PCN reaction causing severe rash involving mucus membranes or skin necrosis: No Has patient had a PCN reaction that required hospitalization No Has patient had a PCN reaction occurring within the last 10 years: No If all of the above answers are "NO", then may proceed with Cephalosporin use.    Chief Complaint  Patient presents with  . Medical Management of Chronic Issues    3 week follow-up blood pressure    HPI:  Essential hypertension - Better controlled on current medication  Insomnia - basically unchanged. Doing okay off zolpidem.  Edema, unspecified type-improved   CKD (chronic kidney disease) stage 2, GFR 60-89 ml/min - Follow-up lab next visit  Chronic pain syndrome - Tolerating pains with current oxycodone/APAP dosing.  Chronic hyponatremia - Follow-up lab next visit  Iron deficiency anemia due to chronic blood loss - Follow-up lab next visit   Mastoiditis of left side - improved. Residual mild ache from time to time.    Medications: Patient's Medications  New Prescriptions   No medications on file  Previous Medications   APIXABAN (ELIQUIS) 5 MG TABS TABLET    Take 1 tablet (5 mg total) by mouth 2 (two) times daily.   ATENOLOL (TENORMIN) 25 MG TABLET    Take 25 mg by mouth at bedtime.    CHOLECALCIFEROL (VITAMIN D) 1000 UNITS TABLET    Take 3,000 Units by mouth daily.   CLONIDINE (CATAPRES) 0.1 MG TABLET    Take one tablet every 8 hours as needed for BP > 180/100   CYANOCOBALAMIN (VITAMIN B-12 CR PO)    Take  1,000 mcg by mouth daily.    DENOSUMAB (PROLIA) 60 MG/ML SOLN INJECTION    Inject 60 mg into the skin every 6 (six) months. Reported on 07/21/2015   DOXYCYCLINE (VIBRA-TABS) 100 MG TABLET    Take one tablet twice daily for 2 weeks, started 10/27/15   FEEDING SUPPLEMENT (BOOST / RESOURCE BREEZE) LIQD    Take 1 Container by mouth daily with supper.    IRBESARTAN (AVAPRO) 150 MG TABLET    One daily to control BP   LEVOTHYROXINE (SYNTHROID, LEVOTHROID) 112 MCG TABLET    Take one tablet by mouth once daily for thyroid supplement   LIDOCAINE (ASPERCREME LIDOCAINE) 4 % PTCH    Apply 3 patches topically once. Apply 1 patch to the left hip, left knee, and the right shoulder in the morning. Remove in the evening.   LORATADINE (CLARITIN) 10 MG TABLET    Take 10 mg by mouth daily. Reported on 09/22/2015   MAGIC MOUTHWASH SOLN    Take 5 mLs by mouth. 5 ml 4 times daily for 4 weeks, started 10/27/15   OMEPRAZOLE (PRILOSEC) 20 MG CAPSULE    Take 1 capsule (20 mg total) by mouth daily.   OXYCODONE-ACETAMINOPHEN (PERCOCET) 7.5-325 MG TABLET    Take one tablet at 8 AM, 1 PM, 6 PM, and 10 PM for pain control. May take an extra tablet between 1AM and  5 AM if needed for pain control.   PROCHLORPERAZINE (COMPAZINE) 10 MG TABLET    Take 1 tablet (10 mg total) by mouth every 6 (six) hours as needed for nausea or vomiting.   SENNA-DOCUSATE (SENOKOT S) 8.6-50 MG TABLET    2 tablets nightly to prevent constipation  Modified Medications   No medications on file  Discontinued Medications   AMLODIPINE (NORVASC) 5 MG TABLET    One daily to control BP     Review of Systems  Constitutional: Negative for fever, chills and diaphoresis.  HENT: Negative for congestion, ear discharge, ear pain, hearing loss, nosebleeds and tinnitus.        Sinus allergies. Left mastoid bone area pain when palpated. History of left mastoid surgery as a child.  Eyes: Negative for photophobia and pain.  Respiratory: Positive for shortness of breath (On  exertion). Negative for cough and wheezing.   Cardiovascular: Positive for leg swelling. Negative for chest pain and palpitations.       Reported BLE pain and edema. Venous doppler 06/04/15 showed no evidence of lower extremity deep venous thrombosis  Gastrointestinal: Positive for constipation. Negative for nausea, vomiting and abdominal pain.       Flatulence. Hemorrhoids.  Genitourinary: Negative.  Negative for dysuria, urgency and frequency.  Musculoskeletal: Positive for back pain and gait problem (using walker).       History of osteoporosis. Pain in the left knee and both hips.  Skin: Negative.  Negative for rash.       Right hip surgical incision healed  Neurological: Negative for dizziness, tremors and headaches.  Psychiatric/Behavioral: Negative for suicidal ideas and hallucinations. The patient is not nervous/anxious.     Filed Vitals:   11/10/15 0913  BP: 124/68  Pulse: 54  Temp: 97.7 F (36.5 C)  Height: 5' 8" (1.727 m)  Weight: 131 lb (59.421 kg)  SpO2: 99%   Wt Readings from Last 3 Encounters:  11/10/15 131 lb (59.421 kg)  10/27/15 131 lb (59.421 kg)  10/20/15 131 lb (59.421 kg)    Body mass index is 19.92 kg/(m^2).  Physical Exam  Constitutional: She is oriented to person, place, and time. No distress.  Elderly. Frail. Slow in movement due to back pain.  HENT:  Right Ear: External ear normal.  Left Ear: External ear normal.  Nose: Nose normal.  Mouth/Throat: Oropharynx is clear and moist. No oropharyngeal exudate.  Left mastoid bone tenderness when palpated.   Eyes: Conjunctivae and EOM are normal. Pupils are equal, round, and reactive to light.  Neck: No JVD present. No tracheal deviation present. No thyromegaly present.  Cardiovascular: Normal rate, regular rhythm, normal heart sounds and intact distal pulses.  Exam reveals no gallop and no friction rub.   No murmur heard. Pulmonary/Chest: No respiratory distress. She has no wheezes. She has no rales.  She exhibits no tenderness.  Abdominal: She exhibits no distension and no mass. There is no tenderness.  Musculoskeletal: Normal range of motion. She exhibits edema and tenderness (Lumbar area and left knee).  BLE edema 1+, mainly in ankles and feet. Unstable on standing. Uses walker to maintain balance.  Lymphadenopathy:    She has no cervical adenopathy.  Neurological: She is alert and oriented to person, place, and time. She has normal reflexes.  10/22/2013 MMSE 30/30. Passed clock drawing  Skin: No rash noted. No erythema. No pallor.  Right hip surgical incision healed.  Psychiatric: She has a normal mood and affect. Her behavior is normal. Judgment and  thought content normal.     Labs reviewed: Lab Summary Latest Ref Rng 09/09/2015 09/08/2015 09/07/2015 09/06/2015 09/05/2015  Hemoglobin 12.0 - 15.0 g/dL 10.6(L) (None) 9.8(L) 10.9(L) 10.7(L)  Hematocrit 36.0 - 46.0 % 29.8(L) (None) 27.8(L) 30.9(L) 29.8(L)  White count 4.0 - 10.5 K/uL 5.2 (None) 4.5 5.4 6.7  Platelet count 150 - 400 K/uL 143(L) (None) 142(L) 160 150  Sodium 135 - 145 mmol/L 136 134(L) 134(L) 133(L) 123(L)  Potassium 3.5 - 5.1 mmol/L 4.4 4.2 4.3 4.9 5.2(H)  Calcium 8.9 - 10.3 mg/dL 8.5(L) 8.6(L) 8.1(L) 8.6(L) 8.6(L)  Phosphorus - (None) (None) (None) (None) (None)  Creatinine 0.44 - 1.00 mg/dL 1.19(H) 1.12(H) 1.23(H) 1.32(H) 1.44(H)  AST 15 - 41 U/L (None) (None) (None) (None) 19  Alk Phos 38 - 126 U/L (None) (None) (None) (None) 54  Bilirubin 0.3 - 1.2 mg/dL (None) (None) (None) (None) 0.9  Glucose 65 - 99 mg/dL 101(H) 104(H) 98 102(H) 86  Cholesterol - (None) (None) (None) (None) (None)  HDL cholesterol - (None) (None) (None) (None) (None)  Triglycerides - (None) (None) (None) (None) (None)  LDL Direct - (None) (None) (None) (None) (None)  LDL Calc - (None) (None) (None) (None) (None)  Total protein 6.5 - 8.1 g/dL (None) (None) (None) (None) 6.6  Albumin 3.5 - 5.0 g/dL (None) (None) (None) (None) 3.7   Lab Results   Component Value Date   TSH 1.150 09/06/2015   Lab Results  Component Value Date   BUN 20 09/09/2015   BUN 19 09/08/2015   BUN 26* 09/07/2015   Lab Results  Component Value Date   CREATININE 1.19* 09/09/2015   CREATININE 1.12* 09/08/2015   CREATININE 1.23* 09/07/2015   No results found for: HGBA1C   Assessment/Plan  1. Essential hypertension Controlled on current medication  2. Insomnia Continue to monitor  3. Edema, unspecified type Improved  4. CKD (chronic kidney disease) stage 2, GFR 60-89 ml/min -CMP, future  5. Chronic pain syndrome Continue current dosing of oxycodone/APAP  6. Chronic hyponatremia -CMP, future  7. Iron deficiency anemia due to chronic blood loss -CBC, future  8. Mastoiditis of left side Finishing 2 weeks of doxycycline today. Will remain on Magic mouthwash for another 2 weeks.

## 2015-11-26 ENCOUNTER — Other Ambulatory Visit: Payer: Self-pay

## 2015-11-26 DIAGNOSIS — G894 Chronic pain syndrome: Secondary | ICD-10-CM

## 2015-11-26 MED ORDER — OXYCODONE-ACETAMINOPHEN 7.5-325 MG PO TABS: ORAL_TABLET | ORAL | Status: DC

## 2015-12-21 ENCOUNTER — Other Ambulatory Visit: Payer: Self-pay

## 2015-12-21 DIAGNOSIS — G894 Chronic pain syndrome: Secondary | ICD-10-CM

## 2015-12-21 MED ORDER — OXYCODONE-ACETAMINOPHEN 7.5-325 MG PO TABS: ORAL_TABLET | ORAL | 0 refills | Status: DC

## 2015-12-21 NOTE — Telephone Encounter (Signed)
RX faxed to [facility] @ [phone]

## 2015-12-29 ENCOUNTER — Non-Acute Institutional Stay: Payer: Medicare Other | Admitting: Internal Medicine

## 2015-12-29 ENCOUNTER — Encounter: Payer: Self-pay | Admitting: Internal Medicine

## 2015-12-29 VITALS — BP 124/68 | HR 56 | Temp 97.5°F | Ht 68.0 in | Wt 137.0 lb

## 2015-12-29 DIAGNOSIS — R609 Edema, unspecified: Secondary | ICD-10-CM | POA: Diagnosis not present

## 2015-12-29 DIAGNOSIS — G894 Chronic pain syndrome: Secondary | ICD-10-CM

## 2015-12-29 DIAGNOSIS — E039 Hypothyroidism, unspecified: Secondary | ICD-10-CM

## 2015-12-29 DIAGNOSIS — E871 Hypo-osmolality and hyponatremia: Secondary | ICD-10-CM | POA: Diagnosis not present

## 2015-12-29 DIAGNOSIS — N182 Chronic kidney disease, stage 2 (mild): Secondary | ICD-10-CM | POA: Diagnosis not present

## 2015-12-29 DIAGNOSIS — I10 Essential (primary) hypertension: Secondary | ICD-10-CM

## 2015-12-29 DIAGNOSIS — K5900 Constipation, unspecified: Secondary | ICD-10-CM

## 2015-12-29 MED ORDER — POLYETHYLENE GLYCOL 3350 17 GM/SCOOP PO POWD: ORAL | 1 refills | Status: DC

## 2015-12-29 MED ORDER — PSYLLIUM 28.3 % PO POWD: ORAL | 5 refills | Status: DC

## 2015-12-29 NOTE — Progress Notes (Signed)
[place] Room Number: AL35  Place of Service: Clinic (12)     Allergies  Allergen Reactions  . Aspirin     Drop in body temp with large quantities, can tolerate low doses of aspirin  . Penicillins Swelling    Has patient had a PCN reaction causing immediate rash, facial/tongue/throat swelling, SOB or lightheadedness with hypotension: No Has patient had a PCN reaction causing severe rash involving mucus membranes or skin necrosis: No Has patient had a PCN reaction that required hospitalization No Has patient had a PCN reaction occurring within the last 10 years: No If all of the above answers are "NO", then may proceed with Cephalosporin use.    Chief Complaint  Patient presents with  . Medical Management of Chronic Issues    4 week follow-up blood pressure, chronic pain, hyponatremia    HPI:  Essential hypertension - Controlled  Chronic pain syndrome - coping with current medications. She is in pain a lot of the time. Pain is mainly in her joints.  Chronic hyponatremia - stable  CKD (chronic kidney disease) stage 2, GFR 60-89 ml/min - stable  Edema, unspecified type - patient thinks it is a little worse over the last 2 months. Amlodipine was started the time of her last visit due to very elevated blood pressures. Blood pressures are under good control. Patient considers the swelling more of a nuisance than a real problem.  Hypothyroidism, unspecified hypothyroidism type - compensated  Constipation, unspecified constipation type - small hard pellet like stools    Medications: Patient's Medications  New Prescriptions   No medications on file  Previous Medications   AMLODIPINE (NORVASC) 5 MG TABLET    Take one tablet at bedtime for blood pressure   APIXABAN (ELIQUIS) 5 MG TABS TABLET    Take 1 tablet (5 mg total) by mouth 2 (two) times daily.   ATENOLOL (TENORMIN) 25 MG TABLET    Take 25 mg by mouth at bedtime.    CHOLECALCIFEROL (VITAMIN D) 1000  UNITS TABLET    Take 3,000 Units by mouth daily.   CLONIDINE (CATAPRES) 0.1 MG TABLET    Take one tablet every 8 hours as needed for BP > 180/100   CYANOCOBALAMIN (VITAMIN B-12 CR PO)    Take 1,000 mcg by mouth daily.    DENOSUMAB (PROLIA) 60 MG/ML SOLN INJECTION    Inject 60 mg into the skin every 6 (six) months. Reported on 07/21/2015   FEEDING SUPPLEMENT (BOOST / RESOURCE BREEZE) LIQD    Take 1 Container by mouth daily with supper.    IRBESARTAN (AVAPRO) 150 MG TABLET    One daily to control BP   LEVOTHYROXINE (SYNTHROID, LEVOTHROID) 112 MCG TABLET    Take one tablet by mouth once daily for thyroid supplement   LIDOCAINE (ASPERCREME LIDOCAINE) 4 % PTCH    Apply 3 patches topically once. Apply 1 patch to the left hip, left knee, and the right shoulder in the morning. Remove in the evening.   LORATADINE (CLARITIN) 10 MG TABLET    Take 10 mg by mouth daily. Reported on 09/22/2015   OMEPRAZOLE (PRILOSEC) 20 MG CAPSULE    Take 1 capsule (20 mg total) by mouth daily.   OXYCODONE-ACETAMINOPHEN (PERCOCET) 7.5-325 MG TABLET    Take one tablet at 8 AM, 1 PM, 6 PM, and 10 PM for pain control. May take an extra tablet between 1AM and 5 AM if needed for pain control.   PROCHLORPERAZINE (COMPAZINE)  10 MG TABLET    Take 1 tablet (10 mg total) by mouth every 6 (six) hours as needed for nausea or vomiting.   SENNA-DOCUSATE (SENOKOT S) 8.6-50 MG TABLET    2 tablets nightly to prevent constipation  Modified Medications   No medications on file  Discontinued Medications   DOXYCYCLINE (VIBRA-TABS) 100 MG TABLET    Take one tablet twice daily for 2 weeks, started 10/27/15   MAGIC MOUTHWASH SOLN    Take 5 mLs by mouth. 5 ml 4 times daily for 4 weeks, started 10/27/15     Review of Systems  Constitutional: Negative for chills, diaphoresis and fever.  HENT: Negative for congestion, ear discharge, ear pain, hearing loss, nosebleeds and tinnitus.        Sinus allergies. Left mastoid bone area pain when palpated. History  of left mastoid surgery as a child.  Eyes: Negative for photophobia and pain.  Respiratory: Positive for shortness of breath (On exertion). Negative for cough and wheezing.   Cardiovascular: Positive for leg swelling. Negative for chest pain and palpitations.       Reported BLE pain and edema. Venous doppler 06/04/15 showed no evidence of lower extremity deep venous thrombosis  Gastrointestinal: Positive for constipation. Negative for abdominal pain, nausea and vomiting.       Flatulence. Hemorrhoids.  Genitourinary: Negative.  Negative for dysuria, frequency and urgency.  Musculoskeletal: Positive for back pain and gait problem (using walker).       History of osteoporosis. Pain in the left knee and both hips.  Skin: Negative.  Negative for rash.       Right hip surgical incision healed  Neurological: Negative for dizziness, tremors and headaches.  Psychiatric/Behavioral: Negative for hallucinations and suicidal ideas. The patient is not nervous/anxious.     Vitals:   12/29/15 0957  BP: 124/68  Pulse: (!) 56  Temp: 97.5 F (36.4 C)  TempSrc: Axillary  Weight: 137 lb (62.1 kg)  Height: '5\' 8"'  (1.727 m)   Wt Readings from Last 3 Encounters:  12/29/15 137 lb (62.1 kg)  11/10/15 131 lb (59.4 kg)  10/27/15 131 lb (59.4 kg)    Body mass index is 20.83 kg/m.  Physical Exam  Constitutional: She is oriented to person, place, and time. No distress.  Elderly. Frail. Slow in movement due to back pain.  HENT:  Right Ear: External ear normal.  Left Ear: External ear normal.  Nose: Nose normal.  Mouth/Throat: Oropharynx is clear and moist. No oropharyngeal exudate.  Left mastoid bone tenderness when palpated.   Eyes: Conjunctivae and EOM are normal. Pupils are equal, round, and reactive to light.  Neck: No JVD present. No tracheal deviation present. No thyromegaly present.  Cardiovascular: Normal rate, regular rhythm, normal heart sounds and intact distal pulses.  Exam reveals no  gallop and no friction rub.   No murmur heard. Pulmonary/Chest: No respiratory distress. She has no wheezes. She has no rales. She exhibits no tenderness.  Abdominal: She exhibits no distension and no mass. There is no tenderness.  Musculoskeletal: Normal range of motion. She exhibits edema and tenderness (Lumbar area and left knee).  BLE edema 1+, mainly in ankles and feet. Unstable on standing. Uses walker to maintain balance.  Lymphadenopathy:    She has no cervical adenopathy.  Neurological: She is alert and oriented to person, place, and time. She has normal reflexes.  10/22/2013 MMSE 30/30. Passed clock drawing  Skin: No rash noted. No erythema. No pallor.  Right hip surgical  incision healed.  Psychiatric: She has a normal mood and affect. Her behavior is normal. Judgment and thought content normal.     Labs reviewed: Lab Summary Latest Ref Rng & Units 09/09/2015 09/08/2015 09/07/2015 09/06/2015 09/05/2015  Hemoglobin 12.0 - 15.0 g/dL 10.6(L) (None) 9.8(L) 10.9(L) 10.7(L)  Hematocrit 36.0 - 46.0 % 29.8(L) (None) 27.8(L) 30.9(L) 29.8(L)  White count 4.0 - 10.5 K/uL 5.2 (None) 4.5 5.4 6.7  Platelet count 150 - 400 K/uL 143(L) (None) 142(L) 160 150  Sodium 135 - 145 mmol/L 136 134(L) 134(L) 133(L) 123(L)  Potassium 3.5 - 5.1 mmol/L 4.4 4.2 4.3 4.9 5.2(H)  Calcium 8.9 - 10.3 mg/dL 8.5(L) 8.6(L) 8.1(L) 8.6(L) 8.6(L)  Phosphorus - (None) (None) (None) (None) (None)  Creatinine 0.44 - 1.00 mg/dL 1.19(H) 1.12(H) 1.23(H) 1.32(H) 1.44(H)  AST 15 - 41 U/L (None) (None) (None) (None) 19  Alk Phos 38 - 126 U/L (None) (None) (None) (None) 54  Bilirubin 0.3 - 1.2 mg/dL (None) (None) (None) (None) 0.9  Glucose 65 - 99 mg/dL 101(H) 104(H) 98 102(H) 86  Cholesterol - (None) (None) (None) (None) (None)  HDL cholesterol - (None) (None) (None) (None) (None)  Triglycerides - (None) (None) (None) (None) (None)  LDL Direct - (None) (None) (None) (None) (None)  LDL Calc - (None) (None) (None) (None)  (None)  Total protein 6.5 - 8.1 g/dL (None) (None) (None) (None) 6.6  Albumin 3.5 - 5.0 g/dL (None) (None) (None) (None) 3.7  Some recent data might be hidden   Lab Results  Component Value Date   TSH 1.150 09/06/2015   Lab Results  Component Value Date   BUN 20 09/09/2015   BUN 19 09/08/2015   BUN 26 (H) 09/07/2015   Lab Results  Component Value Date   CREATININE 1.19 (H) 09/09/2015   CREATININE 1.12 (H) 09/08/2015   CREATININE 1.23 (H) 09/07/2015   No results found for: HGBA1C     Assessment/Plan  1. Essential hypertension Controlled -CMP, future  2. Chronic pain syndrome Adequately controlled   3. Chronic hyponatremia follow-up CMP in future  4. CKD (chronic kidney disease) stage 2, GFR 60-89 ml/min Follow-up CMP in the future  5. Edema, unspecified type Trace to 1+ bipedal. Ongoing labor on her amlodipine for the time being. Her stopping in the future.  6. Hypothyroidism, unspecified hypothyroidism type Compensated  7. Constipation, unspecified constipation type - Psyllium (METAMUCIL SMOOTH TEXTURE) 28.3 % POWD; One tablespoon in 6 oz wsater or juice daily to help prevent constipation  Dispense: 500 g; Refill: 5 - polyethylene glycol powder (GLYCOLAX/MIRALAX) powder; Take 17 grams in 6 oz water or juice daily to prevent constipation  Dispense: 3350 g; Refill: 1

## 2016-01-04 DIAGNOSIS — Z79899 Other long term (current) drug therapy: Secondary | ICD-10-CM | POA: Diagnosis not present

## 2016-01-04 LAB — MAGNESIUM: MAGNESIUM: 2.2

## 2016-01-13 ENCOUNTER — Other Ambulatory Visit: Payer: Self-pay

## 2016-01-13 DIAGNOSIS — G894 Chronic pain syndrome: Secondary | ICD-10-CM

## 2016-01-13 MED ORDER — OXYCODONE-ACETAMINOPHEN 7.5-325 MG PO TABS: ORAL_TABLET | ORAL | 0 refills | Status: DC

## 2016-01-28 IMAGING — DX DG CHEST 2V
2 series · 2 of 2 positions shown · non-contrast
Comparison: None.

CLINICAL DATA: Chest pain with headache and shortness of breath for
2 days.

EXAM:
CHEST  2 VIEW

[w chest lat]
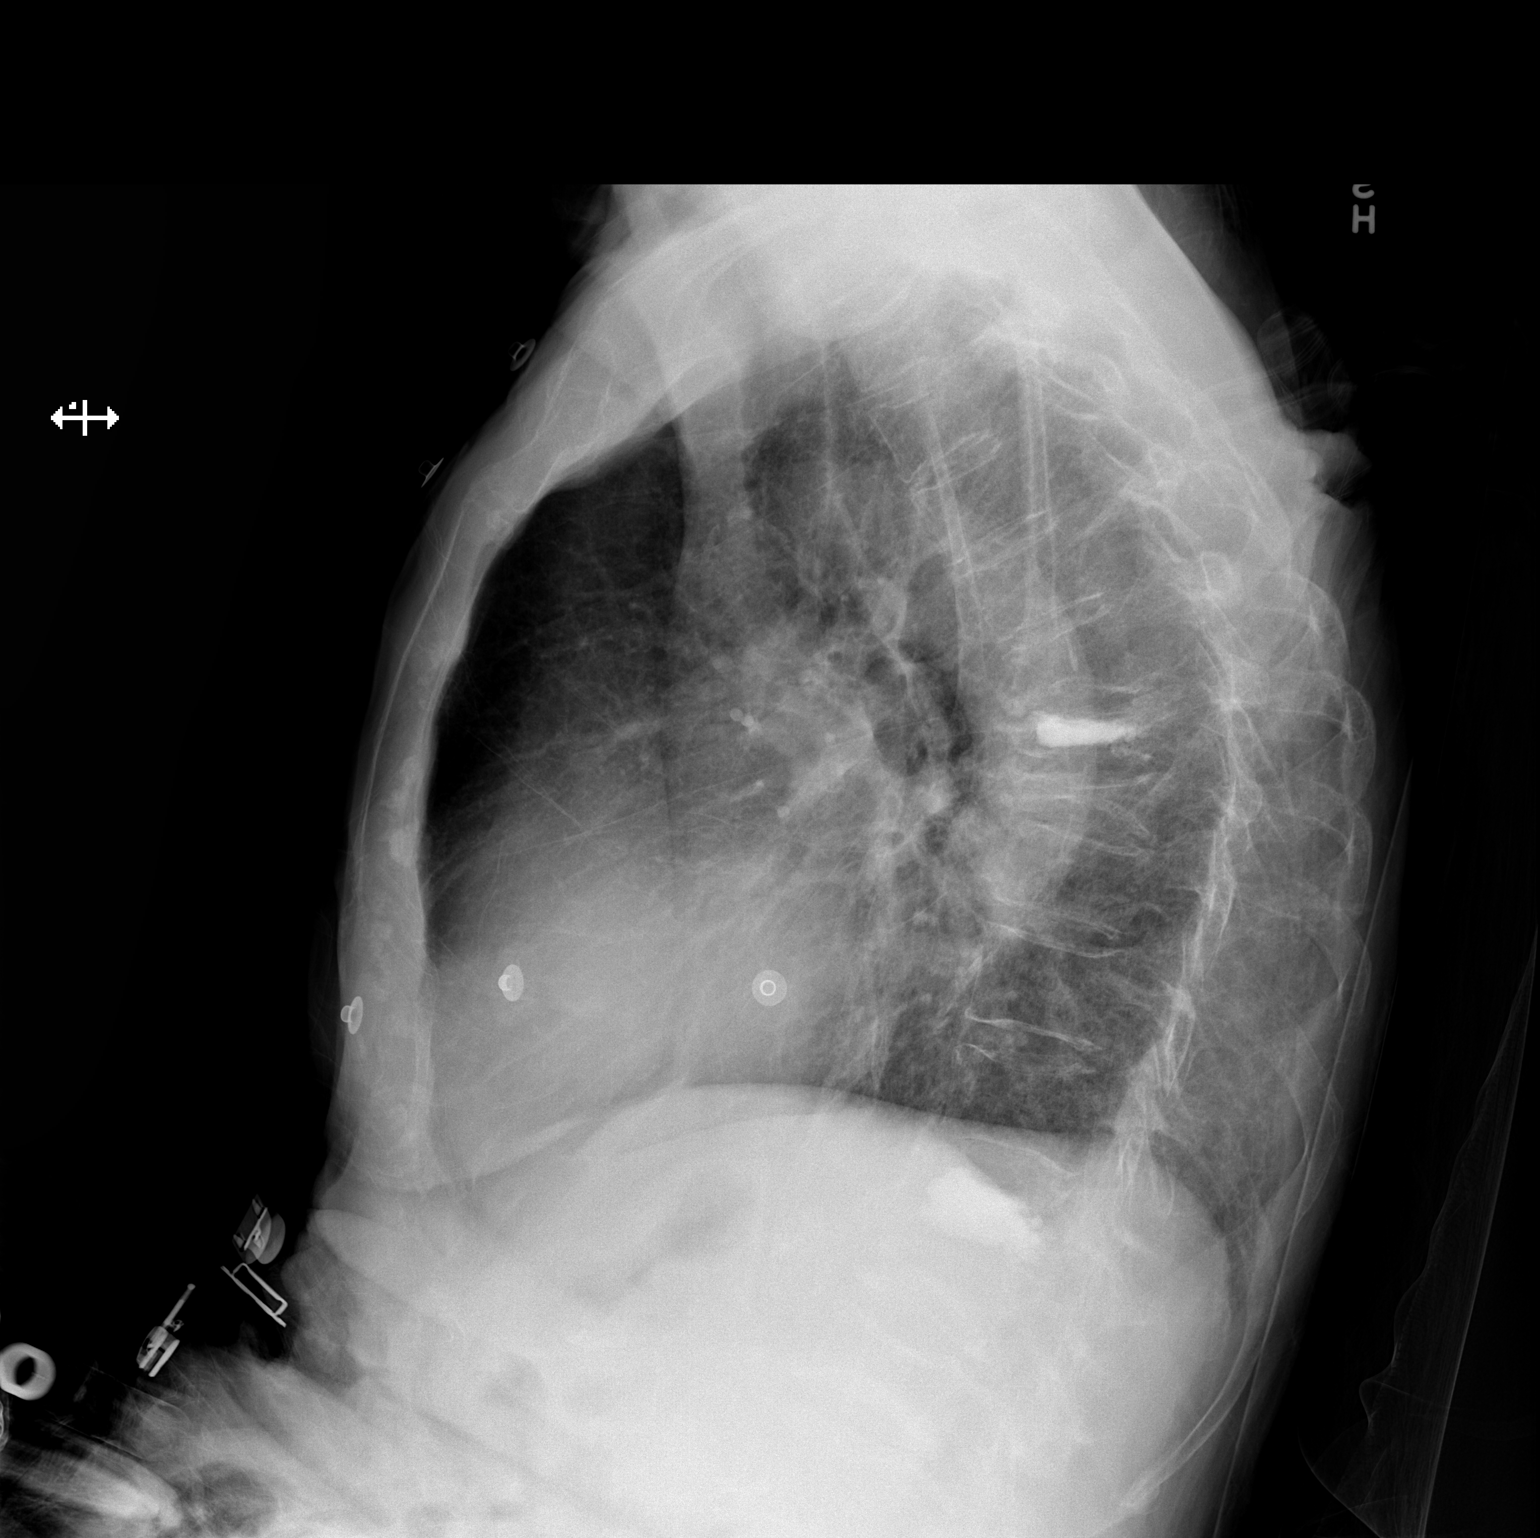

[x chest ap]
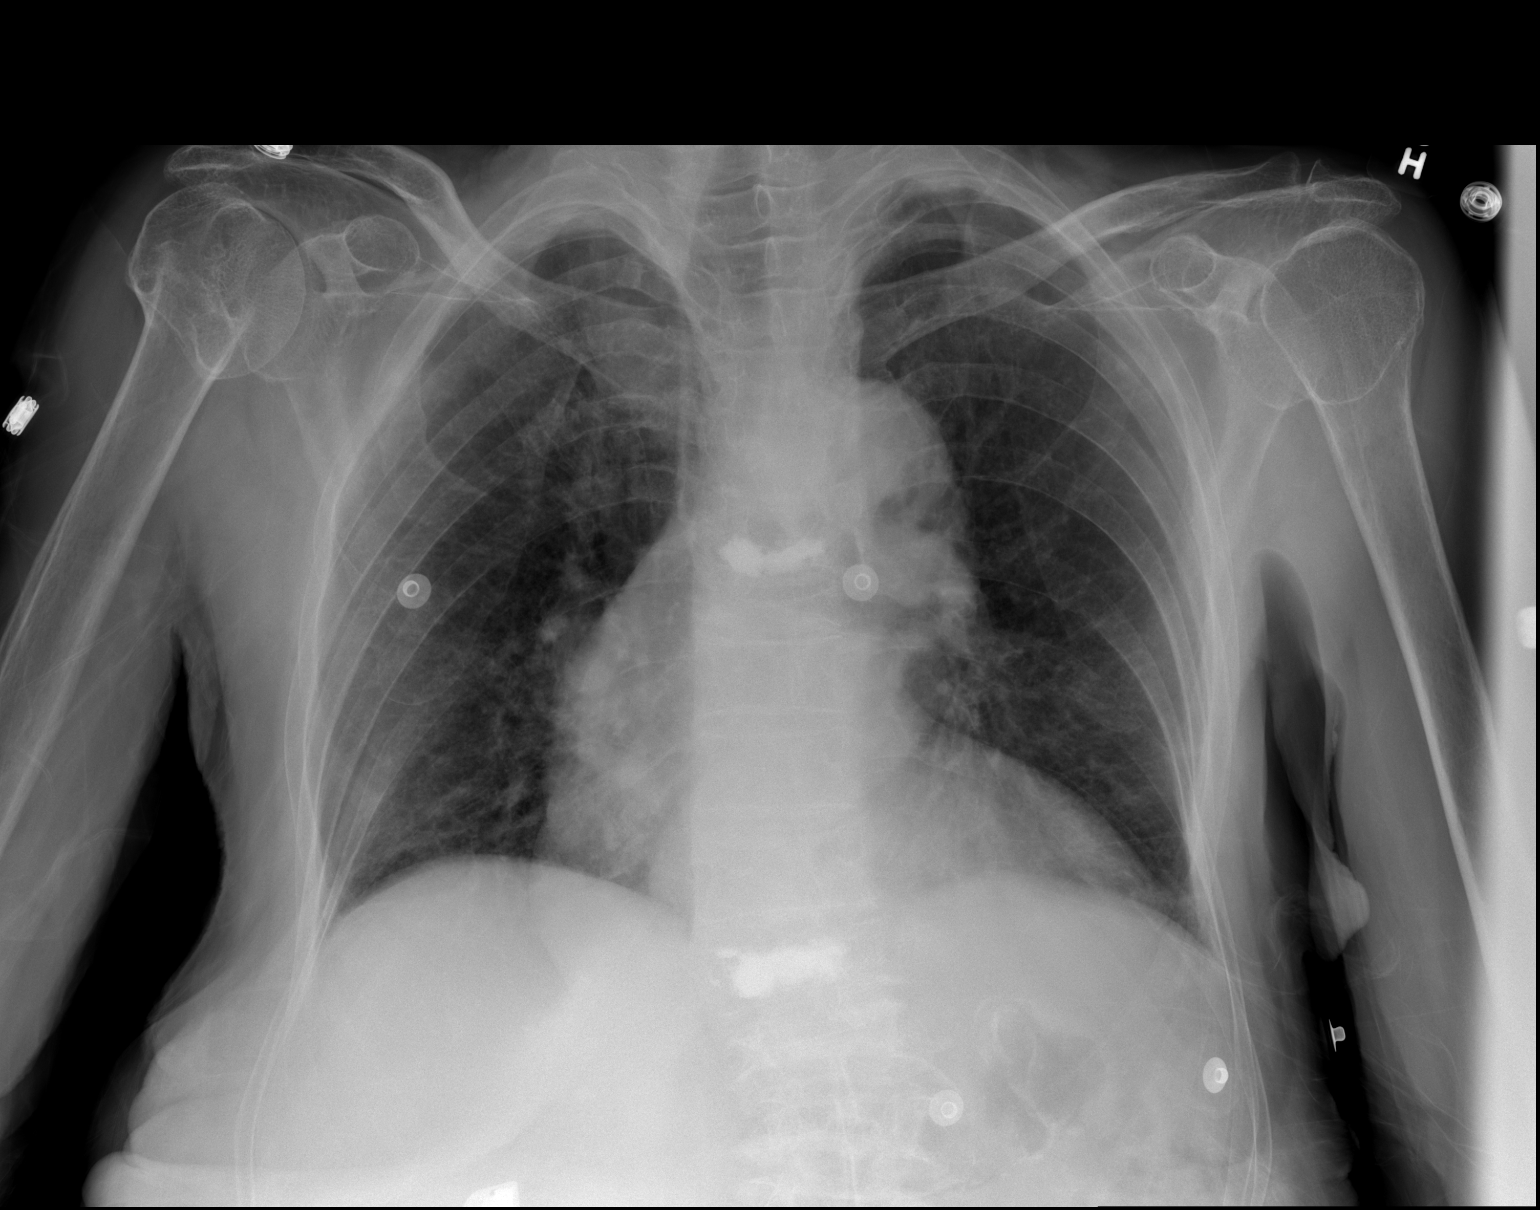

[2 of 2 positions shown; findings below may reference images not displayed]

FINDINGS: Mild cardiomegaly. Tortuosity of the thoracic aorta. No confluent
airspace opacities or effusions. No acute bony abnormality. Multiple
thoracic compression fractures with multilevel vertebroplasty
changes.
IMPRESSION: Cardiomegaly.  No active disease.

## 2016-03-04 ENCOUNTER — Other Ambulatory Visit: Payer: Self-pay

## 2016-03-04 DIAGNOSIS — G894 Chronic pain syndrome: Secondary | ICD-10-CM

## 2016-03-04 MED ORDER — OXYCODONE-ACETAMINOPHEN 7.5-325 MG PO TABS: ORAL_TABLET | ORAL | 0 refills | Status: DC

## 2016-03-04 NOTE — Telephone Encounter (Signed)
Order faxed to [facility] [phone]

## 2016-03-22 ENCOUNTER — Ambulatory Visit: Payer: Medicare Other

## 2016-03-22 DIAGNOSIS — M810 Age-related osteoporosis without current pathological fracture: Secondary | ICD-10-CM

## 2016-03-22 MED ORDER — DENOSUMAB 60 MG/ML ~~LOC~~ SOLN: 60.0000 mg | Freq: Once | SUBCUTANEOUS | 0 refills | Status: DC

## 2016-03-22 MED ORDER — DENOSUMAB 60 MG/ML ~~LOC~~ SOLN: 60.0000 mg | Freq: Once | SUBCUTANEOUS | 0 refills | Status: AC

## 2016-03-23 DIAGNOSIS — M810 Age-related osteoporosis without current pathological fracture: Secondary | ICD-10-CM | POA: Diagnosis not present

## 2016-03-23 MED ORDER — DENOSUMAB 60 MG/ML ~~LOC~~ SOLN
60.0000 mg | Freq: Once | SUBCUTANEOUS | Status: AC
  Administered 2016-03-23: 60 mg via SUBCUTANEOUS

## 2016-04-25 ENCOUNTER — Other Ambulatory Visit: Payer: Self-pay | Admitting: *Deleted

## 2016-04-25 DIAGNOSIS — G894 Chronic pain syndrome: Secondary | ICD-10-CM

## 2016-04-25 MED ORDER — OXYCODONE-ACETAMINOPHEN 7.5-325 MG PO TABS: ORAL_TABLET | ORAL | 0 refills | Status: DC

## 2016-04-25 NOTE — Telephone Encounter (Signed)
[facility]

## 2016-05-03 ENCOUNTER — Encounter: Payer: Self-pay | Admitting: Internal Medicine

## 2016-05-03 ENCOUNTER — Non-Acute Institutional Stay: Payer: Medicare Other | Admitting: Internal Medicine

## 2016-05-03 VITALS — BP 108/62 | HR 57 | Temp 97.9°F | Ht 68.0 in | Wt 137.0 lb

## 2016-05-03 DIAGNOSIS — E039 Hypothyroidism, unspecified: Secondary | ICD-10-CM | POA: Diagnosis not present

## 2016-05-03 DIAGNOSIS — E871 Hypo-osmolality and hyponatremia: Secondary | ICD-10-CM

## 2016-05-03 DIAGNOSIS — R609 Edema, unspecified: Secondary | ICD-10-CM | POA: Diagnosis not present

## 2016-05-03 DIAGNOSIS — D500 Iron deficiency anemia secondary to blood loss (chronic): Secondary | ICD-10-CM | POA: Diagnosis not present

## 2016-05-03 DIAGNOSIS — N182 Chronic kidney disease, stage 2 (mild): Secondary | ICD-10-CM

## 2016-05-03 DIAGNOSIS — G894 Chronic pain syndrome: Secondary | ICD-10-CM | POA: Diagnosis not present

## 2016-05-03 DIAGNOSIS — K5900 Constipation, unspecified: Secondary | ICD-10-CM

## 2016-05-03 DIAGNOSIS — E7800 Pure hypercholesterolemia, unspecified: Secondary | ICD-10-CM

## 2016-05-03 DIAGNOSIS — I10 Essential (primary) hypertension: Secondary | ICD-10-CM | POA: Diagnosis not present

## 2016-05-03 NOTE — Progress Notes (Signed)
Facility  [facility]    Place of Service: (P) Clinic (12)     Allergies  Allergen Reactions  . Aspirin     Drop in body temp with large quantities, can tolerate low doses of aspirin  . Penicillins Swelling    Has patient had a PCN reaction causing immediate rash, facial/tongue/throat swelling, SOB or lightheadedness with hypotension: No Has patient had a PCN reaction causing severe rash involving mucus membranes or skin necrosis: No Has patient had a PCN reaction that required hospitalization No Has patient had a PCN reaction occurring within the last 10 years: No If all of the above answers are "NO", then may proceed with Cephalosporin use.    Chief Complaint  Patient presents with  . Medical Management of Chronic Issues    4 month medication management blood pressure, chronic pain edema. Here with son and husband    HPI:  Essential hypertension - controlled  Chronic pain syndrome - Still with chronic pains, but she feels the current medical regimen is reasonably effective;  continue present plan and medications.   Iron deficiency anemia due to chronic blood loss - stable. Needs followup lab.  Chronic hyponatremia - needs follow up lab  CKD (chronic kidney disease) stage 2, GFR 60-89 ml/min - needs follow up lab  Hypothyroidism, unspecified type - needs follow up lab  Pure hypercholesterolemia - needs follow up lab  Constipation, unspecified constipation type - improved  Edema, unspecified type - about 1-2+ bipedal. Not uncomfortable.    Medications: Patient's Medications  New Prescriptions   No medications on file  Previous Medications   AMLODIPINE (NORVASC) 5 MG TABLET    Take one tablet at bedtime for blood pressure   APIXABAN (ELIQUIS) 5 MG TABS TABLET    Take 1 tablet (5 mg total) by mouth 2 (two) times daily.   ATENOLOL (TENORMIN) 25 MG TABLET    Take 25 mg by mouth at bedtime.    CHOLECALCIFEROL (VITAMIN D) 1000 UNITS TABLET    Take 3,000 Units by mouth  daily.   CLONIDINE (CATAPRES) 0.1 MG TABLET    Take one tablet every 8 hours as needed for BP > 180/100   CYANOCOBALAMIN (VITAMIN B-12 CR PO)    Take 1,000 mcg by mouth daily.    FEEDING SUPPLEMENT (BOOST / RESOURCE BREEZE) LIQD    Take 1 Container by mouth daily with supper.    IRBESARTAN (AVAPRO) 150 MG TABLET    One daily to control BP   LEVOTHYROXINE (SYNTHROID, LEVOTHROID) 112 MCG TABLET    Take one tablet by mouth once daily for thyroid supplement   LIDOCAINE (ASPERCREME LIDOCAINE) 4 % PTCH    Apply 3 patches topically once. Apply 1 patch to the left hip, left knee, and the right shoulder in the morning. Remove in the evening.   LORATADINE (CLARITIN) 10 MG TABLET    Take 10 mg by mouth daily. Reported on 09/22/2015   OMEPRAZOLE (PRILOSEC) 20 MG CAPSULE    Take 1 capsule (20 mg total) by mouth daily.   OXYCODONE-ACETAMINOPHEN (PERCOCET) 7.5-325 MG TABLET    Take one tablet by mouth four times daily for pain   POLYETHYLENE GLYCOL POWDER (GLYCOLAX/MIRALAX) POWDER    Take 17 grams in 6 oz water or juice daily to prevent constipation   PROCHLORPERAZINE (COMPAZINE) 10 MG TABLET    Take 1 tablet (10 mg total) by mouth every 6 (six) hours as needed for nausea or vomiting.   PSYLLIUM (METAMUCIL SMOOTH TEXTURE)  28.3 % POWD    One tablespoon in 6 oz wsater or juice daily to help prevent constipation   SENNA-DOCUSATE (SENOKOT S) 8.6-50 MG TABLET    2 tablets nightly to prevent constipation  Modified Medications   No medications on file  Discontinued Medications   No medications on file     Review of Systems  Constitutional: Negative for chills, diaphoresis and fever.  HENT: Negative for congestion, ear discharge, ear pain, hearing loss, nosebleeds and tinnitus.        Sinus allergies. Left mastoid bone area pain when palpated. History of left mastoid surgery as a child.  Eyes: Negative for photophobia and pain.  Respiratory: Positive for shortness of breath (On exertion). Negative for cough and  wheezing.   Cardiovascular: Positive for leg swelling. Negative for chest pain and palpitations.       Reported BLE pain and edema. Venous doppler 06/04/15 showed no evidence of lower extremity deep venous thrombosis  Gastrointestinal: Positive for constipation. Negative for abdominal pain, nausea and vomiting.       Flatulence. Hemorrhoids.  Genitourinary: Negative.  Negative for dysuria, frequency and urgency.  Musculoskeletal: Positive for back pain and gait problem (using walker).       History of osteoporosis. Pain in the left knee and both hips.  Skin: Negative.  Negative for rash.       Right hip surgical incision healed  Neurological: Negative for dizziness, tremors and headaches.  Psychiatric/Behavioral: Negative for hallucinations and suicidal ideas. The patient is not nervous/anxious.     Vitals:   05/03/16 1052  BP: 108/62  Pulse: (!) 57  Temp: 97.9 F (36.6 C)  TempSrc: Oral  SpO2: 99%  Weight: 137 lb (62.1 kg)  Height: '5\' 8"'  (1.727 m)   Wt Readings from Last 3 Encounters:  05/03/16 137 lb (62.1 kg)  12/29/15 137 lb (62.1 kg)  11/10/15 131 lb (59.4 kg)    Body mass index is 20.83 kg/m.  Physical Exam  Constitutional: She is oriented to person, place, and time. No distress.  Elderly. Frail. Slow in movement due to back pain.  HENT:  Right Ear: External ear normal.  Left Ear: External ear normal.  Nose: Nose normal.  Mouth/Throat: Oropharynx is clear and moist. No oropharyngeal exudate.  Left mastoid bone tenderness when palpated.   Eyes: Conjunctivae and EOM are normal. Pupils are equal, round, and reactive to light.  Neck: No JVD present. No tracheal deviation present. No thyromegaly present.  Cardiovascular: Normal rate, regular rhythm, normal heart sounds and intact distal pulses.  Exam reveals no gallop and no friction rub.   No murmur heard. Pulmonary/Chest: No respiratory distress. She has no wheezes. She has no rales. She exhibits no tenderness.    Abdominal: She exhibits no distension and no mass. There is no tenderness.  Musculoskeletal: Normal range of motion. She exhibits edema and tenderness (Lumbar area and left knee).  BLE edema 1+, mainly in ankles and feet. Unstable on standing. Uses walker to maintain balance.  Lymphadenopathy:    She has no cervical adenopathy.  Neurological: She is alert and oriented to person, place, and time. She has normal reflexes.  10/22/2013 MMSE 30/30. Passed clock drawing  Skin: No rash noted. No erythema. No pallor.  Right hip surgical incision healed.  Psychiatric: She has a normal mood and affect. Her behavior is normal. Judgment and thought content normal.     Labs reviewed: Lab Summary Latest Ref Rng & Units 09/09/2015 09/08/2015 09/07/2015 09/06/2015 09/05/2015  Hemoglobin 12.0 - 15.0 g/dL 10.6(L) (None) 9.8(L) 10.9(L) 10.7(L)  Hematocrit 36.0 - 46.0 % 29.8(L) (None) 27.8(L) 30.9(L) 29.8(L)  White count 4.0 - 10.5 K/uL 5.2 (None) 4.5 5.4 6.7  Platelet count 150 - 400 K/uL 143(L) (None) 142(L) 160 150  Sodium 135 - 145 mmol/L 136 134(L) 134(L) 133(L) 123(L)  Potassium 3.5 - 5.1 mmol/L 4.4 4.2 4.3 4.9 5.2(H)  Calcium 8.9 - 10.3 mg/dL 8.5(L) 8.6(L) 8.1(L) 8.6(L) 8.6(L)  Phosphorus - (None) (None) (None) (None) (None)  Creatinine 0.44 - 1.00 mg/dL 1.19(H) 1.12(H) 1.23(H) 1.32(H) 1.44(H)  AST 15 - 41 U/L (None) (None) (None) (None) 19  Alk Phos 38 - 126 U/L (None) (None) (None) (None) 54  Bilirubin 0.3 - 1.2 mg/dL (None) (None) (None) (None) 0.9  Glucose 65 - 99 mg/dL 101(H) 104(H) 98 102(H) 86  Cholesterol - (None) (None) (None) (None) (None)  HDL cholesterol - (None) (None) (None) (None) (None)  Triglycerides - (None) (None) (None) (None) (None)  LDL Direct - (None) (None) (None) (None) (None)  LDL Calc - (None) (None) (None) (None) (None)  Total protein 6.5 - 8.1 g/dL (None) (None) (None) (None) 6.6  Albumin 3.5 - 5.0 g/dL (None) (None) (None) (None) 3.7  Some recent data might be hidden    Lab Results  Component Value Date   TSH 1.150 09/06/2015   Lab Results  Component Value Date   BUN 20 09/09/2015   BUN 19 09/08/2015   BUN 26 (H) 09/07/2015   Lab Results  Component Value Date   CREATININE 1.19 (H) 09/09/2015   CREATININE 1.12 (H) 09/08/2015   CREATININE 1.23 (H) 09/07/2015   No results found for: HGBA1C     Assessment/Plan  1. Essential hypertension Stop amlodipine due to edema. - CMP, future  2. Chronic pain syndrome The current medical regimen is effective;  continue present plan and medications.  3. Iron deficiency anemia due to chronic blood loss -CBC, future  4. Chronic hyponatremia -CMP, Future  5. CKD (chronic kidney disease) stage 2, GFR 60-89 ml/min -CKD, future  6. Hypothyroidism, unspecified type -TSH, future  7. Pure hypercholesterolemia -lipids, future  8. Constipation, unspecified constipation type improved  9. Edema, unspecified type Continue to observe  Orders written to give Zostavax, Prevnar, and Td

## 2016-05-05 DIAGNOSIS — E039 Hypothyroidism, unspecified: Secondary | ICD-10-CM | POA: Diagnosis not present

## 2016-05-05 DIAGNOSIS — I10 Essential (primary) hypertension: Secondary | ICD-10-CM | POA: Diagnosis not present

## 2016-05-05 DIAGNOSIS — R6889 Other general symptoms and signs: Secondary | ICD-10-CM | POA: Diagnosis not present

## 2016-05-05 LAB — BASIC METABOLIC PANEL
BUN: 24 — AB (ref 4–21)
Creatinine: 1.2 — AB (ref 0.5–1.1)
GLUCOSE: 92
POTASSIUM: 4.1 (ref 3.4–5.3)
Sodium: 133 — AB (ref 137–147)

## 2016-05-05 LAB — HEPATIC FUNCTION PANEL
ALT: 10 (ref 7–35)
AST: 17 (ref 13–35)
Alkaline Phosphatase: 39 (ref 25–125)
BILIRUBIN, TOTAL: 1

## 2016-05-05 LAB — CBC AND DIFFERENTIAL
HEMATOCRIT: 35 — AB (ref 36–46)
HEMOGLOBIN: 11.7 — AB (ref 12.0–16.0)
PLATELETS: 158 (ref 150–399)
WBC: 4.4

## 2016-05-05 LAB — TSH: TSH: 5.78 (ref 0.41–5.90)

## 2016-06-02 DIAGNOSIS — I10 Essential (primary) hypertension: Secondary | ICD-10-CM | POA: Diagnosis not present

## 2016-06-02 LAB — BASIC METABOLIC PANEL
BUN: 23 mg/dL — AB (ref 4–21)
Creatinine: 1 mg/dL (ref <=1.1)
GLUCOSE: 99 mg/dL
Potassium: 3.3 mmol/L — AB (ref 3.4–5.3)
SODIUM: 139 mmol/L (ref 137–147)

## 2016-06-03 ENCOUNTER — Other Ambulatory Visit: Payer: Self-pay | Admitting: *Deleted

## 2016-06-23 ENCOUNTER — Encounter: Payer: Self-pay | Admitting: Nurse Practitioner

## 2016-06-23 DIAGNOSIS — R05 Cough: Secondary | ICD-10-CM | POA: Diagnosis not present

## 2016-06-23 DIAGNOSIS — I10 Essential (primary) hypertension: Secondary | ICD-10-CM | POA: Diagnosis not present

## 2016-06-23 DIAGNOSIS — N390 Urinary tract infection, site not specified: Secondary | ICD-10-CM | POA: Diagnosis not present

## 2016-06-23 DIAGNOSIS — D649 Anemia, unspecified: Secondary | ICD-10-CM | POA: Diagnosis not present

## 2016-06-23 LAB — CBC AND DIFFERENTIAL
HCT: 36 % (ref 36–46)
Hemoglobin: 12.3 g/dL (ref 12.0–16.0)
PLATELETS: 159 10*3/uL (ref 150–399)
WBC: 5.6 10^3/mL

## 2016-06-23 LAB — HEPATIC FUNCTION PANEL
ALT: 8 U/L (ref 7–35)
AST: 17 U/L (ref 13–35)
Alkaline Phosphatase: 52 U/L (ref 25–125)
Bilirubin, Total: 1 mg/dL

## 2016-06-23 LAB — BASIC METABOLIC PANEL
BUN: 29 mg/dL — AB (ref 4–21)
CREATININE: 1.2 mg/dL — AB (ref <=1.1)
Glucose: 93 mg/dL
POTASSIUM: 4 mmol/L (ref 3.4–5.3)
SODIUM: 126 mmol/L — AB (ref 137–147)

## 2016-06-24 ENCOUNTER — Encounter: Payer: Self-pay | Admitting: Nurse Practitioner

## 2016-06-24 ENCOUNTER — Non-Acute Institutional Stay: Payer: Medicare Other | Admitting: Nurse Practitioner

## 2016-06-24 DIAGNOSIS — I2699 Other pulmonary embolism without acute cor pulmonale: Secondary | ICD-10-CM | POA: Diagnosis not present

## 2016-06-24 DIAGNOSIS — K5900 Constipation, unspecified: Secondary | ICD-10-CM | POA: Diagnosis not present

## 2016-06-24 DIAGNOSIS — K219 Gastro-esophageal reflux disease without esophagitis: Secondary | ICD-10-CM

## 2016-06-24 DIAGNOSIS — N182 Chronic kidney disease, stage 2 (mild): Secondary | ICD-10-CM | POA: Diagnosis not present

## 2016-06-24 DIAGNOSIS — D500 Iron deficiency anemia secondary to blood loss (chronic): Secondary | ICD-10-CM | POA: Diagnosis not present

## 2016-06-24 DIAGNOSIS — E039 Hypothyroidism, unspecified: Secondary | ICD-10-CM

## 2016-06-24 DIAGNOSIS — G4700 Insomnia, unspecified: Secondary | ICD-10-CM | POA: Diagnosis not present

## 2016-06-24 DIAGNOSIS — E871 Hypo-osmolality and hyponatremia: Secondary | ICD-10-CM

## 2016-06-24 DIAGNOSIS — R609 Edema, unspecified: Secondary | ICD-10-CM

## 2016-06-24 DIAGNOSIS — I10 Essential (primary) hypertension: Secondary | ICD-10-CM | POA: Diagnosis not present

## 2016-06-24 LAB — BASIC METABOLIC PANEL
BUN: 27 — AB (ref 4–21)
Creatinine: 1.2 — AB (ref 0.5–1.1)
Glucose: 107
Potassium: 4.1 (ref 3.4–5.3)
Sodium: 125 — AB (ref 137–147)

## 2016-06-24 NOTE — Assessment & Plan Note (Signed)
Stable, continue Vit B12 

## 2016-06-24 NOTE — Assessment & Plan Note (Signed)
Continue Eliquis 2.5 mg b.i.d..

## 2016-06-24 NOTE — Assessment & Plan Note (Signed)
05/14/15 Na 128 05/28/15 Na 130 07/07/15 Na 131 06/23/16 Na 126, dc HCT, repeat BMP in am 06/24/16 Na 125, K 4.1, Bun 27, creat 1.27, repeat BMP next Monday.

## 2016-06-24 NOTE — Assessment & Plan Note (Addendum)
Chronic, trace to 1+ BLE, no apparent SOB, paroxysmal nocturnal orthopnea, sputum production. Off HCT-Na 125, observe the patient. 06/23/16 CXR no acute pulmonary pathology.   

## 2016-06-24 NOTE — Assessment & Plan Note (Signed)
Sleeps better, continue Trazodone 50mg qhs.  

## 2016-06-24 NOTE — Assessment & Plan Note (Signed)
Stable, continue Omeprazole 20mg daily.  

## 2016-06-24 NOTE — Assessment & Plan Note (Signed)
04/20/15 Bun/creatinine 23/1.14 07/07/15 Na 131, K 4.5, Bun 28, creat 1.0 06/24/16 Na 125, K 4.1, Bun 27, creat 1.23

## 2016-06-24 NOTE — Progress Notes (Signed)
Location:  [facility] Room Number: 35 Place of Service:  ALF [phone]) Provider:  Ronesha Heenan, Manxie  NP  Jeanmarie Hubert, MD  Patient Care Team: Estill Dooms, MD as PCP - General (Internal Medicine) Blayton Huttner Otho Darner, NP as Nurse Practitioner (Nurse Practitioner)  Extended Emergency Contact Information Primary Emergency Contact: Mand,[name] Address: [phone] [place]          [place], [place] [address] [name] of [place] Phone: [phone] Mobile Phone: [phone] Relation: Spouse Secondary Emergency Contact: Zuno,[name] Address: [address]          [place], [place] [address] [name] of [place] Mobile Phone: [phone] Relation: Son  Code Status:  DNR Goals of care: Advanced Directive information Advanced Directives 06/24/2016  Does Patient Have a Medical Advance Directive? Yes  Type of Paramedic of [place];Living will;Out of facility DNR (pink MOST or yellow form)  Does patient want to make changes to medical advance directive? No - Patient declined  Copy of [place] in Chart? Yes  Would patient like information on creating a medical advance directive? -  Pre-existing out of facility DNR order (yellow form or pink MOST form) -     Chief Complaint  Patient presents with  . Acute Visit    Elevated sodium     HPI:  Pt is a 83 y.o. female seen today for an acute visit for hyponatremia, Na 06/23/16 126, repeat 06/24/16 125, off HCT x1 day, the patient stated she is feeling well, BLE edema has no change. 06/23/16 CXR no acute pulmonary pathology.   Hx of Blood pressure, elevated frequently, taking Atenolol 25mg  qd, Avapro 150mg  daily, prn Clonidine. She is off diuretics, BLE edema persisted.  Hx of PE: chronic Eliquis. Post op anemia, takes Vit B12 daily, last Hgb 12.3 06/23/16, takes Levothyroxine 134mcg daily for hypothyroidism. TSH 1.15 09/06/15, sleep better with Trazodone 50mg  hs.    Past Medical  History:  Diagnosis Date  . Acquired cyst of kidney    left  . Allergic rhinitis, cause unspecified   . Anemia, unspecified   . Chronic hyponatremia 09/05/2015  . CKD stage 2 due to type 2 diabetes mellitus ([facility]) 11/18/2014  . Closed fracture of unspecified part of vertebral column without mention of spinal cord injury    T7 s/p kyphoplasty, T12  . Edema 2015  . Herpes simplex without mention of complication   . History of Epstein-Barr virus infection   . Hyposmolality and/or hyponatremia    07/13/15 Na 131  . Insomnia   . Insomnia, unspecified   . Left cavernous carotid aneurysm 08/2008   1-1/59mm aneurysm of cavernous carotid artery  . Malignant neoplasm of breast (female), unspecified site 1990   left. Had surgerey and chemo, but no radiation  . 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 Neuropathy ([facility])   . Osteoarthrosis of knee    bilaterally  . Osteoarthrosis, hip   . Osteoarthrosis, unspecified whether generalized or localized, forearm    bilaterally wrist  . Other and unspecified nonspecific immunological findings    positive ANA  . Other B-complex deficiencies   . Pain in joint, site unspecified    severe, diffuse, chronic pain  . Positive ANA (antinuclear antibody)   . Pulmonary embolism ([facility]) 05/07/2015  . Pure hypercholesterolemia   . Senile osteoporosis   . Unspecified essential hypertension   . Unspecified gastritis and gastroduodenitis  without mention of hemorrhage   . Unspecified hypothyroidism   . Unspecified vitamin D deficiency    Past Surgical History:  Procedure Laterality Date  . DILATION AND CURETTAGE OF UTERUS  X 2  . FEMUR IM NAIL Right 03/18/2015   Procedure: INTRAMEDULLARY (IM) NAIL FEMORAL;  Surgeon: Rod Can, MD;  Location: [facility];  Service: Orthopedics;  Laterality: Right;  . FRACTURE SURGERY    . KYPHOPLASTY  07/03/2010   T12  . KYPHOPLASTY     C7  . LAPAROSCOPIC CHOLECYSTECTOMY  09/20/2006  .  MASTECTOMY Left 04/29/1989  . MIDDLE EAR SURGERY Bilateral 1938  . NASAL SINUS SURGERY  1990 & 2000  . ORIF HIP FRACTURE Left 06/13/2007   following a syncopal episode  . TONSILLECTOMY  1968    Allergies  Allergen Reactions  . Aspirin     Drop in body temp with large quantities, can tolerate low doses of aspirin  . Penicillins Swelling    Has patient had a PCN reaction causing immediate rash, facial/tongue/throat swelling, SOB or lightheadedness with hypotension: No Has patient had a PCN reaction causing severe rash involving mucus membranes or skin necrosis: No Has patient had a PCN reaction that required hospitalization No Has patient had a PCN reaction occurring within the last 10 years: No If all of the above answers are "NO", then may proceed with Cephalosporin use.    Allergies as of 06/24/2016      Reactions   Aspirin    Drop in body temp with large quantities, can tolerate low doses of aspirin   Penicillins Swelling   Has patient had a PCN reaction causing immediate rash, facial/tongue/throat swelling, SOB or lightheadedness with hypotension: No Has patient had a PCN reaction causing severe rash involving mucus membranes or skin necrosis: No Has patient had a PCN reaction that required hospitalization No Has patient had a PCN reaction occurring within the last 10 years: No If all of the above answers are "NO", then may proceed with Cephalosporin use.      Medication List       Accurate as of 06/24/16  2:17 PM. Always use your most recent med list.          acetaminophen 325 MG tablet Commonly known as:  TYLENOL Take 650 mg by mouth every 4 (four) hours as needed.   apixaban 5 MG Tabs tablet Commonly known as:  ELIQUIS Take 1 tablet (5 mg total) by mouth 2 (two) times daily.   atenolol 25 MG tablet Commonly known as:  TENORMIN Take 25 mg by mouth at bedtime.   cholecalciferol 1000 units tablet Commonly known as:  VITAMIN D Take 3,000 Units by mouth daily.     cloNIDine 0.1 MG tablet Commonly known as:  CATAPRES Take one tablet every 8 hours as needed for BP > 180/100   feeding supplement Liqd Take 1 Container by mouth daily with supper.   guaiFENesin-codeine 100-10 MG/5ML syrup Commonly known as:  ROBITUSSIN AC Take 10 mLs by mouth every 6 (six) hours as needed for cough.   irbesartan 150 MG tablet Commonly known as:  AVAPRO One daily to control BP   levothyroxine 112 MCG tablet Commonly known as:  SYNTHROID, LEVOTHROID Take one tablet by mouth once daily for thyroid supplement   Lidocaine 4 % Ptch Commonly known as:  ASPERCREME LIDOCAINE Apply 3 patches topically once. Apply 1 patch to the left hip, left knee, and the right shoulder in the morning. Remove in the evening.  loratadine 10 MG tablet Commonly known as:  CLARITIN Take 10 mg by mouth daily. Reported on 09/22/2015   omeprazole 20 MG capsule Commonly known as:  PRILOSEC Take 1 capsule (20 mg total) by mouth daily.   oxyCODONE-acetaminophen 7.5-325 MG tablet Commonly known as:  PERCOCET Take one tablet by mouth four times daily for pain   polyethylene glycol powder powder Commonly known as:  GLYCOLAX/MIRALAX Take 17 grams in 6 oz water or juice daily to prevent constipation   Psyllium 28.3 % Powd Commonly known as:  METAMUCIL SMOOTH TEXTURE One tablespoon in 6 oz wsater or juice daily to help prevent constipation   senna-docusate 8.6-50 MG tablet Commonly known as:  SENOKOT S 2 tablets nightly to prevent constipation   traZODone 50 MG tablet Commonly known as:  DESYREL Take 50 mg by mouth at bedtime.   VITAMIN B-12 CR PO Take 1,000 mcg by mouth daily.       Review of Systems  Constitutional: Negative for chills, diaphoresis and fever.  HENT: Negative for congestion, ear discharge, ear pain, hearing loss, nosebleeds and tinnitus.        Sinus allergies. Left mastoid bone area pain when palpated. History of left mastoid surgery as a child.  Eyes:  Negative for photophobia and pain.  Respiratory: Positive for shortness of breath (On exertion). Negative for cough and wheezing.   Cardiovascular: Positive for leg swelling. Negative for chest pain and palpitations.       Reported BLE pain and edema. Venous doppler 06/04/15 showed no evidence of lower extremity deep venous thrombosis  Gastrointestinal: Positive for constipation. Negative for abdominal pain, nausea and vomiting.       Flatulence. Hemorrhoids.  Genitourinary: Negative.  Negative for dysuria, frequency and urgency.  Musculoskeletal: Positive for back pain and gait problem (using walker).       History of osteoporosis. Pain in the left knee and both hips.  Skin: Negative.  Negative for rash.       Right hip surgical incision healed  Neurological: Negative for dizziness, tremors and headaches.  Psychiatric/Behavioral: Negative for hallucinations and suicidal ideas. The patient is not nervous/anxious.     Immunization History  Administered Date(s) Administered  . Influenza Split 02/13/2013  . Influenza-Unspecified 02/27/2014, 02/12/2015, 02/25/2016  . Pneumococcal Polysaccharide-23 02/13/2013   Pertinent  Health Maintenance Due  Topic Date Due  . PNA vac Low Risk Adult (2 of 2 - PCV13) 02/13/2014  . MAMMOGRAM  09/09/2017 (Originally 03/21/1945)  . INFLUENZA VACCINE  Completed  . DEXA SCAN  Completed   Fall Risk  11/10/2015 10/20/2015 10/20/2015 06/30/2015 03/30/2015  Falls in the past year? No Yes No No Yes  Number falls in past yr: - 2 or more - - 2 or more  Injury with Fall? - Yes - - Yes  Risk Factor Category  - - - - High Fall Risk  Risk for fall due to : - History of fall(s);Impaired balance/gait - - History of fall(s);Impaired balance/gait;Impaired mobility  Follow up - - - - Falls evaluation completed   Functional Status Survey:    Vitals:   06/24/16 1109  BP: (!) 176/96  Pulse: (!) 55  Resp: 14  Temp: 99 F (37.2 C)  SpO2: 98%  Weight: 138 lb (62.6 kg)   Height: 5\' 8"  (1.727 m)   Body mass index is 20.98 kg/m. Physical Exam  Constitutional: She is oriented to person, place, and time. No distress.  Elderly. Frail. Slow in movement due to back pain.  HENT:  Right Ear: External ear normal.  Left Ear: External ear normal.  Nose: Nose normal.  Mouth/Throat: Oropharynx is clear and moist. No oropharyngeal exudate.  Left mastoid bone tenderness when palpated.   Eyes: Conjunctivae and EOM are normal. Pupils are equal, round, and reactive to light.  Neck: No JVD present. No tracheal deviation present. No thyromegaly present.  Cardiovascular: Normal rate, regular rhythm, normal heart sounds and intact distal pulses.  Exam reveals no gallop and no friction rub.   No murmur heard. Pulmonary/Chest: No respiratory distress. She has no wheezes. She has no rales. She exhibits no tenderness.  Abdominal: She exhibits no distension and no mass. There is no tenderness.  Musculoskeletal: Normal range of motion. She exhibits edema and tenderness (Lumbar area and left knee).  BLE edema 1+, mainly in ankles and feet. Unstable on standing. Uses walker to maintain balance.  Lymphadenopathy:    She has no cervical adenopathy.  Neurological: She is alert and oriented to person, place, and time. She has normal reflexes.  10/22/2013 MMSE 30/30. Passed clock drawing  Skin: No rash noted. No erythema. No pallor.  Right hip surgical incision healed.  Psychiatric: She has a normal mood and affect. Her behavior is normal. Judgment and thought content normal.    Labs reviewed:  Recent Labs  09/05/15 1704  09/07/15 0452 09/08/15 0504 09/09/15 0507 06/02/16 06/23/16  NA  --   < > 134* 134* 136 139 126*  K  --   < > 4.3 4.2 4.4 3.3* 4.0  CL  --   < > 106 104 106  --   --   CO2  --   < > 19* 22 23  --   --   GLUCOSE  --   < > 98 104* 101*  --   --   BUN  --   < > 26* 19 20 23* 29*  CREATININE  --   < > 1.23* 1.12* 1.19* 1.0 1.2*  CALCIUM  --   < > 8.1* 8.6*  8.5*  --   --   MG 2.2  --   --   --   --   --   --   < > = values in this interval not displayed.  Recent Labs  09/05/15 1234 06/23/16  AST 19 17  ALT 14 8  ALKPHOS 54 52  BILITOT 0.9  --   PROT 6.6  --   ALBUMIN 3.7  --     Recent Labs  09/05/15 1234 09/06/15 0516 09/07/15 0452 09/09/15 0507 06/23/16  WBC 6.7 5.4 4.5 5.2 5.6  NEUTROABS 5.2  --   --   --   --   HGB 10.7* 10.9* 9.8* 10.6* 12.3  HCT 29.8* 30.9* 27.8* 29.8* 36  MCV 91.4 93.9 95.5 95.8  --   PLT 150 160 142* 143* 159   Lab Results  Component Value Date   TSH 1.150 09/06/2015   No results found for: HGBA1C Lab Results  Component Value Date   CHOL 175 11/10/2014   HDL 53 11/10/2014   LDLCALC 87 11/10/2014   TRIG 173 (A) 11/10/2014    Significant Diagnostic Results in last 30 days:  No results found.  Assessment/Plan Hyponatremia 05/14/15 Na 128 05/28/15 Na 130 07/07/15 Na 131 06/23/16 Na 126, dc HCT, repeat BMP in am 06/24/16 Na 125, K 4.1, Bun 27, creat 1.27, repeat BMP next Monday.    Essential hypertension Permissive blood pressure control, continue Avapro 150mg   daily, Atenolol 25mg , prn Clonidine, failed HCT-Na 125   Pulmonary embolus (HCC) Continue Eliquis 2.5mg  bid.   GERD (gastroesophageal reflux disease) Stable, continue Omeprazole 20mg  daily.   Constipation Stable, continue Senna S, MiraLax, Metamucil.   Hypothyroidism continue Synthroid 177mcg. 05/07/15 TSH 1.137, 09/06/15 TSH 1.15  CKD (chronic kidney disease) stage 2, GFR 60-89 ml/min 04/20/15 Bun/creatinine 23/1.14 07/07/15 Na 131, K 4.5, Bun 28, creat 1.0 06/24/16 Na 125, K 4.1, Bun 27, creat 1.23   Insomnia Sleeps better, continue Trazodone 50mg  qhs.   Edema Chronic, trace to 1+ BLE, no apparent SOB, paroxysmal nocturnal orthopnea, sputum production. Off HCT-Na 125, observe the patient. 06/23/16 CXR no acute pulmonary pathology.    Anemia Stable, continue Vit B12     Family/ staff Communication: AL  Labs/tests  ordered:  BMP

## 2016-06-24 NOTE — Assessment & Plan Note (Signed)
continue Synthroid 136mcg. 05/07/15 TSH 1.137, 09/06/15 TSH 1.15

## 2016-06-24 NOTE — Assessment & Plan Note (Addendum)
Stable, continue Senna S, MiraLax, Metamucil.  

## 2016-06-24 NOTE — Assessment & Plan Note (Signed)
Permissive blood pressure control, continue Avapro 150mg  daily, Atenolol 25mg , prn Clonidine, failed HCT-Na 125

## 2016-06-27 DIAGNOSIS — E871 Hypo-osmolality and hyponatremia: Secondary | ICD-10-CM | POA: Diagnosis not present

## 2016-06-27 LAB — BASIC METABOLIC PANEL
BUN: 29 — AB (ref 4–21)
CREATININE: 1.2 — AB (ref 0.5–1.1)
GLUCOSE: 89
POTASSIUM: 4.3 (ref 3.4–5.3)
Sodium: 128 — AB (ref 137–147)

## 2016-07-04 ENCOUNTER — Other Ambulatory Visit: Payer: Self-pay | Admitting: Internal Medicine

## 2016-07-04 ENCOUNTER — Telehealth: Payer: Self-pay | Admitting: *Deleted

## 2016-07-04 DIAGNOSIS — E871 Hypo-osmolality and hyponatremia: Secondary | ICD-10-CM | POA: Diagnosis not present

## 2016-07-04 DIAGNOSIS — I10 Essential (primary) hypertension: Secondary | ICD-10-CM

## 2016-07-04 MED ORDER — DOXAZOSIN MESYLATE 4 MG PO TABS: ORAL_TABLET | ORAL | 5 refills | Status: DC

## 2016-07-04 NOTE — Telephone Encounter (Signed)
Derald Macleod, son called and stated that patient was taken off of BP medication that she has been taking for years. He stated that she has been taking Amlodipine 5mg , Avapro 300mg  and Atenolol 25mg  for years and her BP has been managed. He stated patient was taken off of Atenolol and placed on Cardura instead and her BP pressure has been spiking. Wants to go back on what she has been taking for years. Please Advise.

## 2016-07-04 NOTE — Progress Notes (Signed)
Persistently elevated  BP despite regular use on metoprolol and irbesartan. Added Cardura 4 mg qd.

## 2016-07-05 ENCOUNTER — Other Ambulatory Visit: Payer: Self-pay | Admitting: *Deleted

## 2016-07-05 LAB — BASIC METABOLIC PANEL
BUN: 25 mg/dL — AB (ref 4–21)
Creatinine: 1.1 mg/dL (ref <=1.1)
Glucose: 87 mg/dL
Potassium: 4.2 mmol/L (ref 3.4–5.3)
SODIUM: 131 mmol/L — AB (ref 137–147)

## 2016-07-05 NOTE — Telephone Encounter (Signed)
Patient son notified and agreed.  ?

## 2016-07-05 NOTE — Telephone Encounter (Signed)
She remains on atenolol, Avapro, and hydrochlorothiazide. She was taken off amlodipine in dec 2017. I added the Cardura because almost all BP have been elevated while taking the atenolol, Avapro, and hydrochlorothiazide. She is gong to need the extra medication (Cardura) to control her BP.

## 2016-07-07 DIAGNOSIS — E871 Hypo-osmolality and hyponatremia: Secondary | ICD-10-CM | POA: Diagnosis not present

## 2016-07-07 LAB — BASIC METABOLIC PANEL
BUN: 27 mg/dL — AB (ref 4–21)
CREATININE: 1.2 mg/dL — AB (ref <=1.1)
Glucose: 91 mg/dL
POTASSIUM: 4.4 mmol/L (ref 3.4–5.3)
SODIUM: 130 mmol/L — AB (ref 137–147)

## 2016-07-08 ENCOUNTER — Other Ambulatory Visit: Payer: Self-pay | Admitting: *Deleted

## 2016-08-01 ENCOUNTER — Other Ambulatory Visit: Payer: Self-pay | Admitting: *Deleted

## 2016-08-01 DIAGNOSIS — G894 Chronic pain syndrome: Secondary | ICD-10-CM

## 2016-08-01 MED ORDER — OXYCODONE-ACETAMINOPHEN 7.5-325 MG PO TABS: ORAL_TABLET | ORAL | 0 refills | Status: DC

## 2016-08-01 NOTE — Telephone Encounter (Signed)
[facility] #[phone] Fax: [phone]  

## 2016-08-30 ENCOUNTER — Non-Acute Institutional Stay: Payer: Medicare Other | Admitting: Internal Medicine

## 2016-08-30 ENCOUNTER — Encounter: Payer: Self-pay | Admitting: Internal Medicine

## 2016-08-30 VITALS — BP 142/76 | HR 54 | Temp 97.7°F | Ht 68.0 in | Wt 142.0 lb

## 2016-08-30 DIAGNOSIS — D500 Iron deficiency anemia secondary to blood loss (chronic): Secondary | ICD-10-CM | POA: Diagnosis not present

## 2016-08-30 DIAGNOSIS — I10 Essential (primary) hypertension: Secondary | ICD-10-CM

## 2016-08-30 DIAGNOSIS — E871 Hypo-osmolality and hyponatremia: Secondary | ICD-10-CM

## 2016-08-30 DIAGNOSIS — G894 Chronic pain syndrome: Secondary | ICD-10-CM | POA: Diagnosis not present

## 2016-08-30 DIAGNOSIS — E039 Hypothyroidism, unspecified: Secondary | ICD-10-CM | POA: Diagnosis not present

## 2016-08-30 DIAGNOSIS — M810 Age-related osteoporosis without current pathological fracture: Secondary | ICD-10-CM | POA: Diagnosis not present

## 2016-08-30 DIAGNOSIS — R609 Edema, unspecified: Secondary | ICD-10-CM | POA: Diagnosis not present

## 2016-08-30 MED ORDER — DENOSUMAB 60 MG/ML ~~LOC~~ SOLN
60.0000 mg | Freq: Once | SUBCUTANEOUS | Status: AC
  Administered 2016-08-30: 60 mg via SUBCUTANEOUS

## 2016-08-30 NOTE — Progress Notes (Signed)
[facility] Room Number: AL35  Place of Service: Clinic (12)     Allergies  Allergen Reactions  . Aspirin     Drop in body temp with large quantities, can tolerate low doses of aspirin  . Penicillins Swelling    Has patient had a PCN reaction causing immediate rash, facial/tongue/throat swelling, SOB or lightheadedness with hypotension: No Has patient had a PCN reaction causing severe rash involving mucus membranes or skin necrosis: No Has patient had a PCN reaction that required hospitalization No Has patient had a PCN reaction occurring within the last 10 years: No If all of the above answers are "NO", then may proceed with Cephalosporin use.    Chief Complaint  Patient presents with  . Medical Management of Chronic Issues    4 month medication management blood pressure, chronic pain, tyroid, CKD, anemia, osteoporosis    HPI:  Senile osteoporosis - given denosumab (PROLIA) injection 60 mg today  Chronic pain syndrome - multiple sites: left wrist, left hip, groin, back  Iron deficiency anemia due to chronic blood loss - improved  Chronic hyponatremia - stable at 130  Edema, unspecified type - 2+ bipedal  Essential hypertension - controlled. Still goes high if she is in pain.  Hypothyroidism, unspecified type - needs follow up    Medications: Patient's Medications  New Prescriptions   No medications on file  Previous Medications   ACETAMINOPHEN (TYLENOL) 325 MG TABLET    Take 650 mg by mouth every 4 (four) hours as needed.   APIXABAN (ELIQUIS) 5 MG TABS TABLET    Take 1 tablet (5 mg total) by mouth 2 (two) times daily.   ATENOLOL (TENORMIN) 25 MG TABLET    Take 25 mg by mouth at bedtime.    CHOLECALCIFEROL (VITAMIN D) 1000 UNITS TABLET    Take 3,000 Units by mouth daily.   CLONIDINE (CATAPRES) 0.1 MG TABLET    Take one tablet every 8 hours as needed for BP > 180/100   CYANOCOBALAMIN (VITAMIN B-12 CR PO)    Take 1,000 mcg by mouth daily.    DOXAZOSIN (CARDURA) 4 MG TABLET    One daily to help control BP   FEEDING SUPPLEMENT (BOOST / RESOURCE BREEZE) LIQD    Take 1 Container by mouth daily with supper.    GUAIFENESIN-CODEINE (ROBITUSSIN AC) 100-10 MG/5ML SYRUP    Take 10 mLs by mouth every 6 (six) hours as needed for cough.   IRBESARTAN (AVAPRO) 150 MG TABLET    One daily to control BP   LEVOTHYROXINE (SYNTHROID, LEVOTHROID) 112 MCG TABLET    Take one tablet by mouth once daily for thyroid supplement   LIDOCAINE (ASPERCREME LIDOCAINE) 4 % PTCH    Apply 3 patches topically once. Apply 1 patch to the left hip, left knee, and the right shoulder in the morning. Remove in the evening.   LORATADINE (CLARITIN) 10 MG TABLET    Take 10 mg by mouth daily. Reported on 09/22/2015   OMEPRAZOLE (PRILOSEC) 20 MG CAPSULE    Take 1 capsule (20 mg total) by mouth daily.   OXYCODONE-ACETAMINOPHEN (PERCOCET) 7.5-325 MG TABLET    Take one tablet by mouth four times daily for pain   POLYETHYLENE GLYCOL POWDER (GLYCOLAX/MIRALAX) POWDER    Take 17 grams in 6 oz water or juice daily to prevent constipation   PSYLLIUM (METAMUCIL SMOOTH TEXTURE) 28.3 % POWD    One tablespoon in 6 oz wsater or juice daily to help prevent  constipation   SENNA-DOCUSATE (SENOKOT S) 8.6-50 MG TABLET    2 tablets nightly to prevent constipation   TRAZODONE (DESYREL) 50 MG TABLET    Take 50 mg by mouth at bedtime.  Modified Medications   No medications on file  Discontinued Medications   No medications on file     Review of Systems  Constitutional: Negative for chills, diaphoresis and fever.  HENT: Negative for congestion, ear discharge, ear pain, hearing loss, nosebleeds and tinnitus.        Sinus allergies. Left mastoid bone area pain when palpated. History of left mastoid surgery as a child.  Eyes: Negative for photophobia and pain.  Respiratory: Positive for shortness of breath (On exertion). Negative for cough and wheezing.   Cardiovascular: Positive for leg swelling.  Negative for chest pain and palpitations.       Reported BLE pain and edema. Venous doppler 06/04/15 showed no evidence of lower extremity deep venous thrombosis  Gastrointestinal: Positive for constipation. Negative for abdominal pain, nausea and vomiting.       Flatulence. Hemorrhoids.  Genitourinary: Negative.  Negative for dysuria, frequency and urgency.  Musculoskeletal: Positive for back pain and gait problem (using walker).       History of osteoporosis. Pain in the left knee and both hips, left wrist.  Skin: Negative.  Negative for rash.       Right hip surgical incision healed  Neurological: Negative for dizziness, tremors and headaches.  Psychiatric/Behavioral: Negative for hallucinations and suicidal ideas. The patient is not nervous/anxious.     Vitals:   08/30/16 1015  BP: (!) 142/76  Pulse: (!) 54  Temp: 97.7 F (36.5 C)  TempSrc: Oral  SpO2: 99%  Weight: 142 lb (64.4 kg)  Height: '5\' 8"'  (1.727 m)   Wt Readings from Last 3 Encounters:  08/30/16 142 lb (64.4 kg)  06/24/16 138 lb (62.6 kg)  05/03/16 137 lb (62.1 kg)    Body mass index is 21.59 kg/m.  Physical Exam  Constitutional: She is oriented to person, place, and time. No distress.  Elderly. Frail. Slow in movement due to back pain.  HENT:  Right Ear: External ear normal.  Left Ear: External ear normal.  Nose: Nose normal.  Mouth/Throat: Oropharynx is clear and moist. No oropharyngeal exudate.  Left mastoid bone tenderness when palpated.   Eyes: Conjunctivae and EOM are normal. Pupils are equal, round, and reactive to light.  Neck: No JVD present. No tracheal deviation present. No thyromegaly present.  Cardiovascular: Normal rate, regular rhythm, normal heart sounds and intact distal pulses.  Exam reveals no gallop and no friction rub.   No murmur heard. Pulmonary/Chest: No respiratory distress. She has no wheezes. She has no rales. She exhibits no tenderness.  Abdominal: She exhibits no distension and  no mass. There is no tenderness.  Musculoskeletal: Normal range of motion. She exhibits edema and tenderness (Lumbar area and left knee).  BLE edema 1+, mainly in ankles and feet. Unstable on standing. Uses walker to maintain balance.  Lymphadenopathy:    She has no cervical adenopathy.  Neurological: She is alert and oriented to person, place, and time. She has normal reflexes.  10/22/2013 MMSE 30/30. Passed clock drawing  Skin: No rash noted. No erythema. No pallor.  Right hip surgical incision healed.  Psychiatric: She has a normal mood and affect. Her behavior is normal. Judgment and thought content normal.     Labs reviewed: Lab Summary Latest Ref Rng & Units 07/07/2016 07/05/2016 06/23/2016 06/02/2016  09/09/2015 09/08/2015  Hemoglobin 12.0 - 16.0 g/dL (None) (None) 12.3 (None) 10.6(L) (None)  Hematocrit 36 - 46 % (None) (None) 36 (None) 29.8(L) (None)  White count 10:3/mL (None) (None) 5.6 (None) 5.2 (None)  Platelet count 150 - 399 K/L (None) (None) 159 (None) 143(L) (None)  Sodium 137 - 147 mmol/L 130(A) 131(A) 126(A) 139 136 134(L)  Potassium 3.4 - 5.3 mmol/L 4.4 4.2 4.0 3.3(A) 4.4 4.2  Calcium 8.9 - 10.3 mg/dL (None) (None) (None) (None) 8.5(L) 8.6(L)  Phosphorus - (None) (None) (None) (None) (None) (None)  Creatinine 0.5 - 1.1 mg/dL 1.2(A) 1.1 1.2(A) 1.0 1.19(H) 1.12(H)  AST 13 - 35 U/L (None) (None) 17 (None) (None) (None)  Alk Phos 25 - 125 U/L (None) (None) 52 (None) (None) (None)  Bilirubin - (None) (None) (None) (None) (None) (None)  Glucose mg/dL 91 87 93 99 101(H) 104(H)  Cholesterol - (None) (None) (None) (None) (None) (None)  HDL cholesterol - (None) (None) (None) (None) (None) (None)  Triglycerides - (None) (None) (None) (None) (None) (None)  LDL Direct - (None) (None) (None) (None) (None) (None)  LDL Calc - (None) (None) (None) (None) (None) (None)  Total protein - (None) (None) (None) (None) (None) (None)  Albumin - (None) (None) (None) (None) (None) (None)  Some  recent data might be hidden   Lab Results  Component Value Date   TSH 1.150 09/06/2015   Lab Results  Component Value Date   BUN 27 (A) 07/07/2016   BUN 25 (A) 07/05/2016   BUN 29 (A) 06/23/2016   Lab Results  Component Value Date   CREATININE 1.2 (A) 07/07/2016   CREATININE 1.1 07/05/2016   CREATININE 1.2 (A) 06/23/2016   No results found for: HGBA1C     Assessment/Plan  1. Senile osteoporosis - denosumab (PROLIA) injection 60 mg; Inject 60 mg into the skin once.  2. Chronic pain syndrome Continue current medications  3. Iron deficiency anemia due to chronic blood loss Normal hgb 06/23/16  4. Chronic hyponatremia stable  5. Edema, unspecified type Unchanged  6. Essential hypertension controlled  7. Hypothyroidism, unspecified type - TSH; Future

## 2016-09-01 DIAGNOSIS — R946 Abnormal results of thyroid function studies: Secondary | ICD-10-CM | POA: Diagnosis not present

## 2016-09-01 LAB — TSH
TSH: 9.25 u[IU]/mL — AB (ref <=5.90)
TSH: 9.25 — AB (ref 0.41–5.90)

## 2016-09-02 ENCOUNTER — Encounter: Payer: Self-pay | Admitting: *Deleted

## 2016-09-02 ENCOUNTER — Other Ambulatory Visit: Payer: Self-pay | Admitting: *Deleted

## 2016-09-02 ENCOUNTER — Non-Acute Institutional Stay: Payer: Medicare Other | Admitting: Nurse Practitioner

## 2016-09-02 DIAGNOSIS — M150 Primary generalized (osteo)arthritis: Secondary | ICD-10-CM

## 2016-09-02 DIAGNOSIS — G4700 Insomnia, unspecified: Secondary | ICD-10-CM | POA: Diagnosis not present

## 2016-09-02 DIAGNOSIS — E871 Hypo-osmolality and hyponatremia: Secondary | ICD-10-CM | POA: Diagnosis not present

## 2016-09-02 DIAGNOSIS — N182 Chronic kidney disease, stage 2 (mild): Secondary | ICD-10-CM

## 2016-09-02 DIAGNOSIS — R609 Edema, unspecified: Secondary | ICD-10-CM

## 2016-09-02 DIAGNOSIS — I10 Essential (primary) hypertension: Secondary | ICD-10-CM | POA: Diagnosis not present

## 2016-09-02 DIAGNOSIS — I2699 Other pulmonary embolism without acute cor pulmonale: Secondary | ICD-10-CM | POA: Diagnosis not present

## 2016-09-02 DIAGNOSIS — D500 Iron deficiency anemia secondary to blood loss (chronic): Secondary | ICD-10-CM | POA: Diagnosis not present

## 2016-09-02 DIAGNOSIS — E039 Hypothyroidism, unspecified: Secondary | ICD-10-CM

## 2016-09-02 DIAGNOSIS — M159 Polyosteoarthritis, unspecified: Secondary | ICD-10-CM

## 2016-09-02 DIAGNOSIS — K5900 Constipation, unspecified: Secondary | ICD-10-CM | POA: Diagnosis not present

## 2016-09-02 DIAGNOSIS — K219 Gastro-esophageal reflux disease without esophagitis: Secondary | ICD-10-CM

## 2016-09-02 DIAGNOSIS — C50912 Malignant neoplasm of unspecified site of left female breast: Secondary | ICD-10-CM | POA: Diagnosis not present

## 2016-09-02 NOTE — Assessment & Plan Note (Signed)
09/01/16 TSH 9.25 09/02/16 increase Levothyroxine 124mcg qd, TSH 8 weeks.

## 2016-09-02 NOTE — Assessment & Plan Note (Signed)
Multiple sites, continue Percocet 7.5/325 qid and qd prn

## 2016-09-02 NOTE — Progress Notes (Signed)
Location:  [facility] Room Number: 35 Place of Service:  ALF [phone]) Provider: Mast, Manxie  NP  Jeanmarie Hubert, MD  Patient Care Team: Estill Dooms, MD as PCP - General (Internal Medicine) Man Otho Darner, NP as Nurse Practitioner (Nurse Practitioner)  Extended Emergency Contact Information Primary Emergency Contact: Show,[name] Address: [phone] [place]          [place], [place] [address] [name] of [place] Phone: [phone] Mobile Phone: [phone] Relation: Spouse Secondary Emergency Contact: Fenley,[name] Address: [address]          [place],  [address] [name] of [place] Mobile Phone: [phone] Relation: Son  Code Status:  DNR Goals of care: Advanced Directive information Advanced Directives 09/02/2016  Does Patient Have a Medical Advance Directive? Yes  Type of Paramedic of [place];Living will;Out of facility DNR (pink MOST or yellow form)  Does patient want to make changes to medical advance directive? No - Patient declined  Copy of [place] in Chart? Yes  Would patient like information on creating a medical advance directive? -  Pre-existing out of facility DNR order (yellow form or pink MOST form) Yellow form placed in chart (order not valid for inpatient use)     Chief Complaint  Patient presents with  . Acute Visit    Elevated TSH results    HPI:  Pt is a 83 y.o. female seen today for an acute visit for TSH 9.25 08/23/16, taking Levothyroxine 159mcg po daily.   Hx of Blood pressure, elevated frequently, taking Atenolol 25mg  qd, Avapro 150mg  daily, prn Clonidine. She is off diuretics, BLE edema persisted. Hx of PE: chronic Eliquis. Post op anemia, takes Vit B12 daily, last Hgb 12.3 06/23/16, sleep better with Trazodone 50mg  hs.    Past Medical History:  Diagnosis Date  . Acquired cyst of kidney    left  . Allergic rhinitis, cause unspecified   . Anemia,  unspecified   . Chronic hyponatremia 09/05/2015  . CKD stage 2 due to type 2 diabetes mellitus ([facility]) 11/18/2014  . Closed fracture of unspecified part of vertebral column without mention of spinal cord injury    T7 s/p kyphoplasty, T12  . Edema 2015  . Herpes simplex without mention of complication   . History of Epstein-Barr virus infection   . Hyposmolality and/or hyponatremia    07/13/15 Na 131  . Insomnia   . Insomnia, unspecified   . Left cavernous carotid aneurysm 08/2008   1-1/64mm aneurysm of cavernous carotid artery  . Malignant neoplasm of breast (female), unspecified site 1990   left. Had surgerey and chemo, but no radiation  . 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 Neuropathy   . Osteoarthrosis of knee    bilaterally  . Osteoarthrosis, hip   . Osteoarthrosis, unspecified whether generalized or localized, forearm    bilaterally wrist  . Other and unspecified nonspecific immunological findings    positive ANA  . Other B-complex deficiencies   . Pain in joint, site unspecified    severe, diffuse, chronic pain  . Positive ANA (antinuclear antibody)   . Pulmonary embolism ([facility]) 05/07/2015  . Pure hypercholesterolemia   . Senile osteoporosis   . Unspecified essential hypertension   . Unspecified gastritis and gastroduodenitis without mention of hemorrhage   . Unspecified hypothyroidism   . Unspecified vitamin D deficiency    Past Surgical History:  Procedure Laterality Date  . DILATION AND CURETTAGE OF UTERUS  X 2  . FEMUR IM NAIL Right 03/18/2015   Procedure: INTRAMEDULLARY (IM) NAIL FEMORAL;  Surgeon: Rod Can, MD;  Location: [facility];  Service: Orthopedics;  Laterality: Right;  . FRACTURE SURGERY    . KYPHOPLASTY  07/03/2010   T12  . KYPHOPLASTY     C7  . LAPAROSCOPIC CHOLECYSTECTOMY  09/20/2006  . MASTECTOMY Left 04/29/1989  . MIDDLE EAR SURGERY Bilateral 1938  . NASAL SINUS SURGERY  1990 & 2000  . ORIF HIP FRACTURE  Left 06/13/2007   following a syncopal episode  . TONSILLECTOMY  1968    Allergies  Allergen Reactions  . Aspirin     Drop in body temp with large quantities, can tolerate low doses of aspirin  . Penicillins Swelling    Has patient had a PCN reaction causing immediate rash, facial/tongue/throat swelling, SOB or lightheadedness with hypotension: No Has patient had a PCN reaction causing severe rash involving mucus membranes or skin necrosis: No Has patient had a PCN reaction that required hospitalization No Has patient had a PCN reaction occurring within the last 10 years: No If all of the above answers are "NO", then may proceed with Cephalosporin use.    Outpatient Encounter Prescriptions as of 09/02/2016  Medication Sig  . acetaminophen (TYLENOL) 325 MG tablet Take 650 mg by mouth every 4 (four) hours as needed.  Marland Kitchen apixaban (ELIQUIS) 5 MG TABS tablet Take 1 tablet (5 mg total) by mouth 2 (two) times daily.  Marland Kitchen atenolol (TENORMIN) 25 MG tablet Take 25 mg by mouth at bedtime.   . cholecalciferol (VITAMIN D) 1000 UNITS tablet Take 3,000 Units by mouth daily.  . cloNIDine (CATAPRES) 0.1 MG tablet Take one tablet every 8 hours as needed for BP > 180/100  . Cyanocobalamin (VITAMIN B-12 CR PO) Take 1,000 mcg by mouth daily.   Marland Kitchen denosumab (PROLIA) 60 MG/ML SOLN injection Inject 60 mg into the skin every 6 (six) months. Administer in upper arm, thigh, or abdomen  . doxazosin (CARDURA) 4 MG tablet One daily to help control BP  . feeding supplement (BOOST / RESOURCE BREEZE) LIQD Take 1 Container by mouth daily with supper.   Marland Kitchen guaiFENesin-codeine (ROBITUSSIN AC) 100-10 MG/5ML syrup Take 10 mLs by mouth every 6 (six) hours as needed for cough.  . irbesartan (AVAPRO) 150 MG tablet One daily to control BP  . levothyroxine (SYNTHROID, LEVOTHROID) 112 MCG tablet Take one tablet by mouth once daily for thyroid supplement  . Lidocaine (ASPERCREME LIDOCAINE) 4 % PTCH Apply 3 patches topically once. Apply  1 patch to the left hip, left knee, and the right shoulder in the morning. Remove in the evening.  . loratadine (CLARITIN) 10 MG tablet Take 10 mg by mouth daily. Reported on 09/22/2015  . omeprazole (PRILOSEC) 20 MG capsule Take 1 capsule (20 mg total) by mouth daily.  . polyethylene glycol powder (GLYCOLAX/MIRALAX) powder Take 17 grams in 6 oz water or juice daily to prevent constipation  . senna-docusate (SENOKOT S) 8.6-50 MG tablet 2 tablets nightly to prevent constipation  . traZODone (DESYREL) 50 MG tablet Take 50 mg by mouth at bedtime.  . Psyllium (METAMUCIL SMOOTH TEXTURE) 28.3 % POWD One tablespoon in 6 oz wsater or juice daily to help prevent constipation  . [DISCONTINUED] oxyCODONE-acetaminophen (PERCOCET) 7.5-325 MG tablet Take one tablet by mouth four times daily for pain   No facility-administered encounter medications on file as of 09/02/2016.  Review of Systems  Constitutional: Negative for chills, diaphoresis and fever.  HENT: Negative for congestion, ear discharge, ear pain, hearing loss, nosebleeds and tinnitus.        Sinus allergies. Left mastoid bone area pain when palpated. History of left mastoid surgery as a child.  Eyes: Negative for photophobia and pain.  Respiratory: Positive for shortness of breath (On exertion). Negative for cough and wheezing.   Cardiovascular: Positive for leg swelling. Negative for chest pain and palpitations.       Reported BLE pain and edema. Venous doppler 06/04/15 showed no evidence of lower extremity deep venous thrombosis  Gastrointestinal: Positive for constipation. Negative for abdominal pain, nausea and vomiting.       Flatulence. Hemorrhoids.  Genitourinary: Negative.  Negative for dysuria, frequency and urgency.  Musculoskeletal: Positive for back pain and gait problem (using walker).       History of osteoporosis. Pain in the left knee and both hips, left wrist.  Skin: Negative.  Negative for rash.       Right hip surgical  incision healed  Neurological: Negative for dizziness, tremors and headaches.  Psychiatric/Behavioral: Negative for hallucinations and suicidal ideas. The patient is not nervous/anxious.     Immunization History  Administered Date(s) Administered  . Influenza Split 02/13/2013  . Influenza-Unspecified 02/27/2014, 02/12/2015, 02/25/2016  . Pneumococcal Polysaccharide-23 02/13/2013   Pertinent  Health Maintenance Due  Topic Date Due  . PNA vac Low Risk Adult (2 of 2 - PCV13) 02/13/2014  . MAMMOGRAM  09/09/2017 (Originally 03/21/1945)  . INFLUENZA VACCINE  12/14/2016  . DEXA SCAN  Completed   Fall Risk  11/10/2015 10/20/2015 10/20/2015 06/30/2015 03/30/2015  Falls in the past year? No Yes No No Yes  Number falls in past yr: - 2 or more - - 2 or more  Injury with Fall? - Yes - - Yes  Risk Factor Category  - - - - High Fall Risk  Risk for fall due to : - History of fall(s);Impaired balance/gait - - History of fall(s);Impaired balance/gait;Impaired mobility  Follow up - - - - Falls evaluation completed   Functional Status Survey:    Vitals:   09/02/16 0905  BP: (!) 158/98  Pulse: (!) 57  Resp: 18  Temp: 97.5 F (36.4 C)  SpO2: 99%  Weight: 143 lb 6.4 oz (65 kg)  Height: 5\' 8"  (1.727 m)   Body mass index is 21.8 kg/m. Physical Exam  Constitutional: She is oriented to person, place, and time. No distress.  Elderly. Frail. Slow in movement due to back pain.  HENT:  Right Ear: External ear normal.  Left Ear: External ear normal.  Nose: Nose normal.  Mouth/Throat: Oropharynx is clear and moist. No oropharyngeal exudate.  Left mastoid bone tenderness when palpated.   Eyes: Conjunctivae and EOM are normal. Pupils are equal, round, and reactive to light.  Neck: No JVD present. No tracheal deviation present. No thyromegaly present.  Cardiovascular: Normal rate, regular rhythm and intact distal pulses.  Exam reveals no gallop and no friction rub.   No murmur heard. Pulmonary/Chest: No  respiratory distress. She has no wheezes. She has no rales. She exhibits no tenderness.  Abdominal: She exhibits no distension and no mass. There is no tenderness.  Musculoskeletal: Normal range of motion. She exhibits edema and tenderness (Lumbar area and left knee).  BLE edema 1+, mainly in ankles and feet. Unstable on standing. Uses walker to maintain balance.  Lymphadenopathy:    She has no cervical adenopathy.  Neurological: She is alert and oriented to person, place, and time. She has normal reflexes.  10/22/2013 MMSE 30/30. Passed clock drawing  Skin: No rash noted. No erythema. No pallor.  Right hip surgical incision healed. Left s/p mastectomy.   Psychiatric: She has a normal mood and affect. Her behavior is normal. Judgment and thought content normal.    Labs reviewed:  Recent Labs  09/05/15 1704  09/07/15 0452 09/08/15 0504 09/09/15 0507  06/23/16 07/05/16 07/07/16  NA  --   < > 134* 134* 136  < > 126* 131* 130*  K  --   < > 4.3 4.2 4.4  < > 4.0 4.2 4.4  CL  --   < > 106 104 106  --   --   --   --   CO2  --   < > 19* 22 23  --   --   --   --   GLUCOSE  --   < > 98 104* 101*  --   --   --   --   BUN  --   < > 26* 19 20  < > 29* 25* 27*  CREATININE  --   < > 1.23* 1.12* 1.19*  < > 1.2* 1.1 1.2*  CALCIUM  --   < > 8.1* 8.6* 8.5*  --   --   --   --   MG 2.2  --   --   --   --   --   --   --   --   < > = values in this interval not displayed.  Recent Labs  09/05/15 1234 06/23/16  AST 19 17  ALT 14 8  ALKPHOS 54 52  BILITOT 0.9  --   PROT 6.6  --   ALBUMIN 3.7  --     Recent Labs  09/05/15 1234 09/06/15 0516 09/07/15 0452 09/09/15 0507 06/23/16  WBC 6.7 5.4 4.5 5.2 5.6  NEUTROABS 5.2  --   --   --   --   HGB 10.7* 10.9* 9.8* 10.6* 12.3  HCT 29.8* 30.9* 27.8* 29.8* 36  MCV 91.4 93.9 95.5 95.8  --   PLT 150 160 142* 143* 159   Lab Results  Component Value Date   TSH 9.25 (A) 09/01/2016   No results found for: HGBA1C Lab Results  Component Value Date     CHOL 175 11/10/2014   HDL 53 11/10/2014   LDLCALC 87 11/10/2014   TRIG 173 (A) 11/10/2014    Significant Diagnostic Results in last 30 days:  No results found.  Assessment/Plan Hypothyroidism 09/01/16 TSH 9.25 09/02/16 increase Levothyroxine 144mcg qd, TSH 8 weeks.    Essential hypertension Permissive blood pressure control, continue Avapro 150mg  daily, Atenolol 25mg , prn Clonidine, 07/07/16 Na 130, K 4.4, Bun 27, creat 1.22  Pulmonary embolus ([facility]) Continue Eliquis 5mg  bid.   GERD (gastroesophageal reflux disease) Stable, continue Omeprazole 20mg  daily.   Constipation Stable, continue Senna S, MiraLax, Metamucil.   Osteoarthritis Multiple sites, continue Percocet 7.5/325 qid and qd prn  CKD (chronic kidney disease) stage 2, GFR 60-89 ml/min Last creat 1.22 07/07/16  Insomnia Sleeps better, continue Trazodone 50mg  qhs.   Edema Chronic, trace to 1+ BLE, no apparent SOB, paroxysmal nocturnal orthopnea, sputum production. Off HCT-Na 125, observe the patient. 06/23/16 CXR no acute pulmonary pathology.    Anemia Last Hgb 12.3 06/23/16  Chronic hyponatremia Last Na 130 07/07/16  Malignant neoplasm of female breast ([facility]) left. Had  surgerey and chemo, but no radiation. May Mammogram R breast if the patient desires.      Family/ staff Communication: AL  Labs/tests ordered: TSH 8 weeks.

## 2016-09-02 NOTE — Assessment & Plan Note (Signed)
Chronic, trace to 1+ BLE, no apparent SOB, paroxysmal nocturnal orthopnea, sputum production. Off HCT-Na 125, observe the patient. 06/23/16 CXR no acute pulmonary pathology.

## 2016-09-02 NOTE — Assessment & Plan Note (Signed)
Last Hgb 12.3 06/23/16

## 2016-09-02 NOTE — Assessment & Plan Note (Signed)
Stable, continue Omeprazole 20mg daily.  

## 2016-09-02 NOTE — Assessment & Plan Note (Signed)
left. Had surgerey and chemo, but no radiation. May Mammogram R breast if the patient desires.

## 2016-09-02 NOTE — Assessment & Plan Note (Signed)
Last creat 1.22 07/07/16

## 2016-09-02 NOTE — Assessment & Plan Note (Signed)
Stable, continue Senna S, MiraLax, Metamucil.

## 2016-09-02 NOTE — Assessment & Plan Note (Signed)
Sleeps better, continue Trazodone 50mg  qhs.

## 2016-09-02 NOTE — Assessment & Plan Note (Addendum)
Continue Eliquis 5 mg b.i.d..

## 2016-09-02 NOTE — Assessment & Plan Note (Addendum)
Permissive blood pressure control, continue Avapro 150mg  daily, Atenolol 25mg , prn Clonidine, 07/07/16 Na 130, K 4.4, Bun 27, creat 1.22

## 2016-09-02 NOTE — Assessment & Plan Note (Signed)
Last Na 130 07/07/16

## 2016-09-13 ENCOUNTER — Encounter: Payer: Self-pay | Admitting: Nurse Practitioner

## 2016-09-13 ENCOUNTER — Non-Acute Institutional Stay: Payer: Medicare Other | Admitting: Nurse Practitioner

## 2016-09-13 DIAGNOSIS — K219 Gastro-esophageal reflux disease without esophagitis: Secondary | ICD-10-CM | POA: Diagnosis not present

## 2016-09-13 DIAGNOSIS — D500 Iron deficiency anemia secondary to blood loss (chronic): Secondary | ICD-10-CM

## 2016-09-13 DIAGNOSIS — R609 Edema, unspecified: Secondary | ICD-10-CM

## 2016-09-13 DIAGNOSIS — E039 Hypothyroidism, unspecified: Secondary | ICD-10-CM

## 2016-09-13 DIAGNOSIS — E871 Hypo-osmolality and hyponatremia: Secondary | ICD-10-CM | POA: Diagnosis not present

## 2016-09-13 DIAGNOSIS — N182 Chronic kidney disease, stage 2 (mild): Secondary | ICD-10-CM

## 2016-09-13 DIAGNOSIS — G4700 Insomnia, unspecified: Secondary | ICD-10-CM

## 2016-09-13 DIAGNOSIS — M150 Primary generalized (osteo)arthritis: Secondary | ICD-10-CM

## 2016-09-13 DIAGNOSIS — I10 Essential (primary) hypertension: Secondary | ICD-10-CM

## 2016-09-13 DIAGNOSIS — M159 Polyosteoarthritis, unspecified: Secondary | ICD-10-CM

## 2016-09-13 DIAGNOSIS — I2699 Other pulmonary embolism without acute cor pulmonale: Secondary | ICD-10-CM

## 2016-09-13 DIAGNOSIS — K5900 Constipation, unspecified: Secondary | ICD-10-CM

## 2016-09-13 NOTE — Assessment & Plan Note (Signed)
Chronic, trace to 1+ BLE, no apparent SOB, paroxysmal nocturnal orthopnea, sputum production. Off HCT-Na 125, observe the patient. 06/23/16 CXR no acute pulmonary pathology.

## 2016-09-13 NOTE — Assessment & Plan Note (Signed)
Continue Eliquis 2.5 mg b.i.d..

## 2016-09-13 NOTE — Progress Notes (Signed)
Location:  [facility] Room Number: 35 Place of Service:  ALF [phone]) Provider:  Tashea Othman, Manxie  NP  Jeanmarie Hubert, MD  Patient Care Team: Estill Dooms, MD as PCP - General (Internal Medicine) Alba Kriesel Otho Darner, NP as Nurse Practitioner (Nurse Practitioner)  Extended Emergency Contact Information Primary Emergency Contact: Gaut,[name] Address: [phone] [place]          [place], [place] [address] [name] of [place] Phone: [phone] Mobile Phone: [phone] Relation: Spouse Secondary Emergency Contact: Deschamps,[name] Address: [address]          [place], [place] [address] [name] of [place] Mobile Phone: [phone] Relation: Son  Code Status:  DNR Goals of care: Advanced Directive information Advanced Directives 09/13/2016  Does Patient Have a Medical Advance Directive? Yes  Type of Paramedic of [place];Living will;Out of facility DNR (pink MOST or yellow form)  Does patient want to make changes to medical advance directive? No - Patient declined  Copy of [place] in Chart? Yes  Would patient like information on creating a medical advance directive? -  Pre-existing out of facility DNR order (yellow form or pink MOST form) Yellow form placed in chart (order not valid for inpatient use)     Chief Complaint  Patient presents with  . Acute Visit    BP  Issues    HPI:  Pt is a 83 y.o. female seen today for an acute visit for elevated BP in am, runs in 180s/100s, she stated she is under stress since her husband was admitted to SNF, lots of separation anxiety, but she denied chest pain, pressure, cough, admitted palpitation x1 a few days ago, resolved w/o intervention. Her appetite is poor, denied dysuria.     Hx of Blood pressure, elevated frequently, taking Atenolol 25mg  qd, Avapro 150mg  daily, prn Clonidine. She is off diuretics, BLE edema persisted. Hx of PE: chronic Eliquis. Post op  anemia, takes Vit B12 daily, last Hgb 12.3 06/23/16, sleep better with Trazodone 50mg  hs. 08/23/16 TSH 9.25, taking Levothyroxine   Past Medical History:  Diagnosis Date  . Acquired cyst of kidney    left  . Allergic rhinitis, cause unspecified   . Anemia, unspecified   . Chronic hyponatremia 09/05/2015  . CKD stage 2 due to type 2 diabetes mellitus ([facility]) 11/18/2014  . Closed fracture of unspecified part of vertebral column without mention of spinal cord injury    T7 s/p kyphoplasty, T12  . Edema 2015  . Herpes simplex without mention of complication   . History of Epstein-Barr virus infection   . Hyposmolality and/or hyponatremia    07/13/15 Na 131  . Insomnia   . Insomnia, unspecified   . Left cavernous carotid aneurysm 08/2008   1-1/21mm aneurysm of cavernous carotid artery  . Malignant neoplasm of breast (female), unspecified site 1990   left. Had surgerey and chemo, but no radiation  . 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 Neuropathy   . Osteoarthrosis of knee    bilaterally  . Osteoarthrosis, hip   . Osteoarthrosis, unspecified whether generalized or localized, forearm    bilaterally wrist  . Other and unspecified nonspecific immunological findings    positive ANA  . Other B-complex deficiencies   . Pain in joint, site unspecified    severe, diffuse, chronic pain  . Positive ANA (antinuclear antibody)   . Pulmonary embolism ([facility])  05/07/2015  . Pure hypercholesterolemia   . Senile osteoporosis   . Unspecified essential hypertension   . Unspecified gastritis and gastroduodenitis without mention of hemorrhage   . Unspecified hypothyroidism   . Unspecified vitamin D deficiency    Past Surgical History:  Procedure Laterality Date  . DILATION AND CURETTAGE OF UTERUS  X 2  . FEMUR IM NAIL Right 03/18/2015   Procedure: INTRAMEDULLARY (IM) NAIL FEMORAL;  Surgeon: Rod Can, MD;  Location: [facility];  Service: Orthopedics;   Laterality: Right;  . FRACTURE SURGERY    . KYPHOPLASTY  07/03/2010   T12  . KYPHOPLASTY     C7  . LAPAROSCOPIC CHOLECYSTECTOMY  09/20/2006  . MASTECTOMY Left 04/29/1989  . MIDDLE EAR SURGERY Bilateral 1938  . NASAL SINUS SURGERY  1990 & 2000  . ORIF HIP FRACTURE Left 06/13/2007   following a syncopal episode  . TONSILLECTOMY  1968    Allergies  Allergen Reactions  . Aspirin     Drop in body temp with large quantities, can tolerate low doses of aspirin  . Penicillins Swelling    Has patient had a PCN reaction causing immediate rash, facial/tongue/throat swelling, SOB or lightheadedness with hypotension: No Has patient had a PCN reaction causing severe rash involving mucus membranes or skin necrosis: No Has patient had a PCN reaction that required hospitalization No Has patient had a PCN reaction occurring within the last 10 years: No If all of the above answers are "NO", then may proceed with Cephalosporin use.    Outpatient Encounter Prescriptions as of 09/13/2016  Medication Sig  . acetaminophen (TYLENOL) 325 MG tablet Take 650 mg by mouth every 4 (four) hours as needed.  Marland Kitchen apixaban (ELIQUIS) 5 MG TABS tablet Take 1 tablet (5 mg total) by mouth 2 (two) times daily.  Marland Kitchen atenolol (TENORMIN) 25 MG tablet Take 25 mg by mouth at bedtime.   . cholecalciferol (VITAMIN D) 1000 UNITS tablet Take 3,000 Units by mouth daily.  . cloNIDine (CATAPRES) 0.1 MG tablet Take one tablet every 8 hours as needed for BP > 180/100  . Cyanocobalamin (VITAMIN B-12 CR PO) Take 1,000 mcg by mouth daily.   Marland Kitchen denosumab (PROLIA) 60 MG/ML SOLN injection Inject 60 mg into the skin every 6 (six) months. Administer in upper arm, thigh, or abdomen  . doxazosin (CARDURA) 4 MG tablet One daily to help control BP  . feeding supplement (BOOST / RESOURCE BREEZE) LIQD Take 1 Container by mouth daily with supper.   Marland Kitchen guaiFENesin-codeine (ROBITUSSIN AC) 100-10 MG/5ML syrup Take 10 mLs by mouth every 6 (six) hours as needed for  cough.  . irbesartan (AVAPRO) 150 MG tablet One daily to control BP  . levothyroxine (SYNTHROID, LEVOTHROID) 112 MCG tablet Take one tablet by mouth once daily for thyroid supplement  . Lidocaine (ASPERCREME LIDOCAINE) 4 % PTCH Apply 3 patches topically once. Apply 1 patch to the left hip, left knee, and the right shoulder in the morning. Remove in the evening.  . loratadine (CLARITIN) 10 MG tablet Take 10 mg by mouth daily. Reported on 09/22/2015  . omeprazole (PRILOSEC) 20 MG capsule Take 1 capsule (20 mg total) by mouth daily.  . polyethylene glycol powder (GLYCOLAX/MIRALAX) powder Take 17 grams in 6 oz water or juice daily to prevent constipation  . Psyllium (METAMUCIL SMOOTH TEXTURE) 28.3 % POWD One tablespoon in 6 oz wsater or juice daily to help prevent constipation  . senna-docusate (SENOKOT S) 8.6-50 MG tablet 2 tablets nightly to prevent  constipation  . traZODone (DESYREL) 50 MG tablet Take 50 mg by mouth at bedtime.   No facility-administered encounter medications on file as of 09/13/2016.     Review of Systems  Constitutional: Negative for chills, diaphoresis and fever.  HENT: Negative for congestion, ear discharge, ear pain, hearing loss, nosebleeds and tinnitus.        Sinus allergies. Left mastoid bone area pain when palpated. History of left mastoid surgery as a child.  Eyes: Negative for photophobia and pain.  Respiratory: Positive for shortness of breath (On exertion). Negative for cough and wheezing.   Cardiovascular: Positive for leg swelling. Negative for chest pain and palpitations.       Reported BLE pain and edema. Venous doppler 06/04/15 showed no evidence of lower extremity deep venous thrombosis  Gastrointestinal: Positive for constipation. Negative for abdominal pain, nausea and vomiting.       Flatulence. Hemorrhoids.  Genitourinary: Negative.  Negative for dysuria, frequency and urgency.  Musculoskeletal: Positive for back pain and gait problem (using walker).        History of osteoporosis. Pain in the left knee and both hips, left wrist.  Skin: Negative.  Negative for rash.       Right hip surgical incision healed  Neurological: Negative for dizziness, tremors and headaches.  Psychiatric/Behavioral: Negative for hallucinations and suicidal ideas. The patient is not nervous/anxious.     Immunization History  Administered Date(s) Administered  . Influenza Split 02/13/2013  . Influenza-Unspecified 02/27/2014, 02/12/2015, 02/25/2016  . Pneumococcal Polysaccharide-23 02/13/2013   Pertinent  Health Maintenance Due  Topic Date Due  . PNA vac Low Risk Adult (2 of 2 - PCV13) 02/13/2014  . MAMMOGRAM  09/09/2017 (Originally 03/21/1945)  . INFLUENZA VACCINE  12/14/2016  . DEXA SCAN  Completed   Fall Risk  11/10/2015 10/20/2015 10/20/2015 06/30/2015 03/30/2015  Falls in the past year? No Yes No No Yes  Number falls in past yr: - 2 or more - - 2 or more  Injury with Fall? - Yes - - Yes  Risk Factor Category  - - - - High Fall Risk  Risk for fall due to : - History of fall(s);Impaired balance/gait - - History of fall(s);Impaired balance/gait;Impaired mobility  Follow up - - - - Falls evaluation completed   Functional Status Survey:    Vitals:   09/13/16 1402  BP: 140/86  Pulse: 63  Resp: 18  Temp: 98 F (36.7 C)  SpO2: 98%  Weight: 143 lb (64.9 kg)  Height: 5\' 8"  (1.727 m)   Body mass index is 21.74 kg/m. Physical Exam  Constitutional: She is oriented to person, place, and time. No distress.  Elderly. Frail. Slow in movement due to back pain.  HENT:  Right Ear: External ear normal.  Left Ear: External ear normal.  Nose: Nose normal.  Mouth/Throat: Oropharynx is clear and moist. No oropharyngeal exudate.  Left mastoid bone tenderness when palpated.   Eyes: Conjunctivae and EOM are normal. Pupils are equal, round, and reactive to light.  Neck: No JVD present. No tracheal deviation present. No thyromegaly present.  Cardiovascular: Normal rate,  regular rhythm and intact distal pulses.  Exam reveals no gallop and no friction rub.   No murmur heard. Pulmonary/Chest: No respiratory distress. She has no wheezes. She has no rales. She exhibits no tenderness.  Abdominal: She exhibits no distension and no mass. There is no tenderness.  Musculoskeletal: Normal range of motion. She exhibits edema and tenderness (Lumbar area and left knee).  BLE edema 1+, mainly in ankles and feet. Unstable on standing. Uses walker to maintain balance.  Lymphadenopathy:    She has no cervical adenopathy.  Neurological: She is alert and oriented to person, place, and time. She has normal reflexes.  10/22/2013 MMSE 30/30. Passed clock drawing  Skin: No rash noted. No erythema. No pallor.  Right hip surgical incision healed. Left s/p mastectomy.   Psychiatric: She has a normal mood and affect. Her behavior is normal. Judgment and thought content normal.    Labs reviewed:  Recent Labs  06/23/16 07/05/16 07/07/16  NA 126* 131* 130*  K 4.0 4.2 4.4  BUN 29* 25* 27*  CREATININE 1.2* 1.1 1.2*    Recent Labs  06/23/16  AST 17  ALT 8  ALKPHOS 52    Recent Labs  06/23/16  WBC 5.6  HGB 12.3  HCT 36  PLT 159   Lab Results  Component Value Date   TSH 9.25 (A) 09/01/2016   No results found for: HGBA1C Lab Results  Component Value Date   CHOL 175 11/10/2014   HDL 53 11/10/2014   LDLCALC 87 11/10/2014   TRIG 173 (A) 11/10/2014    Significant Diagnostic Results in last 30 days:  No results found.  Assessment/Plan Essential hypertension Permissive blood pressure control, increase Avapro 300mg  daily, continue Atenolol 25mg  qhs, Cardura 4mg  qd, prn Clonidine, 07/07/16 Na 130, K 4.4, Bun 27, creat 1.22. Update CBC CMP  Pulmonary embolus (HCC) Continue Eliquis 2.5mg  bid  GERD (gastroesophageal reflux disease) Stable, continue Omeprazole 20mg  daily.   Constipation Stable, continue Senna S, MiraLax, Metamucil.   Hypothyroidism 09/01/16 TSH  9.25 09/02/16 increase Levothyroxine 188mcg qd, TSH 8 weeks.   Osteoarthritis Multiple sites, continue Percocet 7.5/325 qid and qd prn   CKD (chronic kidney disease) stage 2, GFR 60-89 ml/min Last creat 1.22 07/07/16  Insomnia Sleeps better, continue Trazodone 50mg  qhs.   Edema Chronic, trace to 1+ BLE, no apparent SOB, paroxysmal nocturnal orthopnea, sputum production. Off HCT-Na 125, observe the patient. 06/23/16 CXR no acute pulmonary pathology.   Anemia Stable.   Chronic hyponatremia Last Na 130 07/07/16, repeat CMP     Family/ staff Communication: AL  Labs/tests ordered:  CBC CMP

## 2016-09-13 NOTE — Assessment & Plan Note (Signed)
Stable, continue Omeprazole 20mg daily.  

## 2016-09-13 NOTE — Assessment & Plan Note (Signed)
09/01/16 TSH 9.25 09/02/16 increase Levothyroxine 126mcg qd, TSH 8 weeks.

## 2016-09-13 NOTE — Assessment & Plan Note (Signed)
Stable

## 2016-09-13 NOTE — Assessment & Plan Note (Signed)
Last Na 130 07/07/16, repeat CMP

## 2016-09-13 NOTE — Assessment & Plan Note (Signed)
Sleeps better, continue Trazodone 50mg  qhs.

## 2016-09-13 NOTE — Assessment & Plan Note (Signed)
Multiple sites, continue Percocet 7.5/325 qid and qd prn

## 2016-09-13 NOTE — Assessment & Plan Note (Signed)
Last creat 1.22 07/07/16

## 2016-09-13 NOTE — Assessment & Plan Note (Signed)
Permissive blood pressure control, increase Avapro 300mg  daily, continue Atenolol 25mg  qhs, Cardura 4mg  qd, prn Clonidine, 07/07/16 Na 130, K 4.4, Bun 27, creat 1.22. Update CBC CMP

## 2016-09-13 NOTE — Assessment & Plan Note (Signed)
Stable, continue Senna S, MiraLax, Metamucil.

## 2016-09-19 DIAGNOSIS — R7989 Other specified abnormal findings of blood chemistry: Secondary | ICD-10-CM | POA: Diagnosis not present

## 2016-09-19 LAB — HEPATIC FUNCTION PANEL
ALT: 7 (ref 7–35)
AST: 15 (ref 13–35)
Alkaline Phosphatase: 44 (ref 25–125)
BILIRUBIN, TOTAL: 1

## 2016-09-19 LAB — BASIC METABOLIC PANEL
BUN: 23 — AB (ref 4–21)
CREATININE: 1.1 (ref 0.5–1.1)
GLUCOSE: 89
Potassium: 4.1 (ref 3.4–5.3)
Sodium: 127 — AB (ref 137–147)

## 2016-09-19 LAB — CBC AND DIFFERENTIAL
HEMATOCRIT: 34 — AB (ref 36–46)
HEMOGLOBIN: 11.9 — AB (ref 12.0–16.0)
Platelets: 156 (ref 150–399)
WBC: 4.4

## 2016-09-22 DIAGNOSIS — E871 Hypo-osmolality and hyponatremia: Secondary | ICD-10-CM | POA: Diagnosis not present

## 2016-09-22 LAB — BASIC METABOLIC PANEL WITH GFR
BUN: 25 mg/dL — AB (ref 4–21)
Creatinine: 1.2 mg/dL — AB (ref <=1.1)
Glucose: 90 mg/dL
Potassium: 4.1 mmol/L (ref 3.4–5.3)
Sodium: 127 mmol/L — AB (ref 137–147)

## 2016-09-23 ENCOUNTER — Encounter: Payer: Self-pay | Admitting: Nurse Practitioner

## 2016-09-23 ENCOUNTER — Other Ambulatory Visit: Payer: Self-pay | Admitting: *Deleted

## 2016-09-27 ENCOUNTER — Encounter: Payer: Self-pay | Admitting: Internal Medicine

## 2016-09-29 DIAGNOSIS — E871 Hypo-osmolality and hyponatremia: Secondary | ICD-10-CM | POA: Diagnosis not present

## 2016-09-29 LAB — BASIC METABOLIC PANEL
BUN: 17 (ref 4–21)
CREATININE: 1.1 (ref 0.5–1.1)
Glucose: 82
Potassium: 4.1 (ref 3.4–5.3)
Sodium: 131 — AB (ref 137–147)

## 2016-10-06 DIAGNOSIS — E871 Hypo-osmolality and hyponatremia: Secondary | ICD-10-CM | POA: Diagnosis not present

## 2016-10-06 DIAGNOSIS — I10 Essential (primary) hypertension: Secondary | ICD-10-CM | POA: Diagnosis not present

## 2016-10-06 LAB — BASIC METABOLIC PANEL
BUN: 27 mg/dL — AB (ref 4–21)
CREATININE: 1.1 mg/dL (ref 0.5–1.1)
Glucose: 85 mg/dL
POTASSIUM: 4.2 mmol/L (ref 3.4–5.3)
Sodium: 131 mmol/L — AB (ref 137–147)

## 2016-10-13 ENCOUNTER — Encounter: Payer: Self-pay | Admitting: Internal Medicine

## 2016-10-13 DIAGNOSIS — I10 Essential (primary) hypertension: Secondary | ICD-10-CM | POA: Diagnosis not present

## 2016-10-13 LAB — BASIC METABOLIC PANEL
BUN: 19 mg/dL (ref 4–21)
CREATININE: 1.1 mg/dL (ref <=1.1)
GLUCOSE: 108 mg/dL
Potassium: 4.2 mmol/L (ref 3.4–5.3)
Sodium: 133 mmol/L — AB (ref 137–147)

## 2016-10-14 ENCOUNTER — Other Ambulatory Visit: Payer: Self-pay | Admitting: *Deleted

## 2016-10-17 ENCOUNTER — Encounter: Payer: Self-pay | Admitting: Internal Medicine

## 2016-10-24 DIAGNOSIS — I10 Essential (primary) hypertension: Secondary | ICD-10-CM | POA: Diagnosis not present

## 2016-10-24 LAB — BASIC METABOLIC PANEL
BUN: 24 — AB (ref 4–21)
Creatinine: 1.1 (ref 0.5–1.1)
GLUCOSE: 91
Potassium: 4 (ref 3.4–5.3)
SODIUM: 132 — AB (ref 137–147)

## 2016-10-27 ENCOUNTER — Encounter: Payer: Self-pay | Admitting: Internal Medicine

## 2016-10-27 DIAGNOSIS — E871 Hypo-osmolality and hyponatremia: Secondary | ICD-10-CM | POA: Diagnosis not present

## 2016-10-27 DIAGNOSIS — I10 Essential (primary) hypertension: Secondary | ICD-10-CM | POA: Diagnosis not present

## 2016-10-27 LAB — BASIC METABOLIC PANEL
BUN: 24 — AB (ref 4–21)
CREATININE: 1.1 (ref 0.5–1.1)
Glucose: 86
POTASSIUM: 4.3 (ref 3.4–5.3)
Sodium: 132 — AB (ref 137–147)

## 2016-10-27 LAB — TSH: TSH: 2.8 (ref 0.41–5.90)

## 2016-10-28 ENCOUNTER — Other Ambulatory Visit: Payer: Self-pay

## 2016-10-28 ENCOUNTER — Other Ambulatory Visit: Payer: Self-pay | Admitting: *Deleted

## 2016-10-28 MED ORDER — OXYCODONE-ACETAMINOPHEN 7.5-325 MG PO TABS: 1.0000 | ORAL_TABLET | Freq: Four times a day (QID) | ORAL | 0 refills | Status: DC

## 2016-10-28 NOTE — Telephone Encounter (Signed)
A refill request was received from [facility] for oxycodone-APAP 7.5-325 mg tablets. This medication was not on patient's medication list, so I called [facility] to confirm that this medication was prescribed. The floor Nurse did confirm that patient takes this medication as follows: Take 1 tablet by mouth 4 times daily scheduled and take 1 tablet by mouth daily as needed between 1 am and 6:30 am.   Rx was printed for #[id number] with no RF and placed in Dr. [place] Haven folder for signing. Fax Rx to [phone].

## 2016-10-31 DIAGNOSIS — I10 Essential (primary) hypertension: Secondary | ICD-10-CM | POA: Diagnosis not present

## 2016-10-31 DIAGNOSIS — E871 Hypo-osmolality and hyponatremia: Secondary | ICD-10-CM | POA: Diagnosis not present

## 2016-11-07 DIAGNOSIS — E876 Hypokalemia: Secondary | ICD-10-CM | POA: Diagnosis not present

## 2016-11-07 LAB — BASIC METABOLIC PANEL
BUN: 25 — AB (ref 4–21)
Creatinine: 1.1 (ref 0.5–1.1)
GLUCOSE: 99
Potassium: 4.3 (ref 3.4–5.3)
SODIUM: 133 — AB (ref 137–147)

## 2016-11-14 DIAGNOSIS — E876 Hypokalemia: Secondary | ICD-10-CM | POA: Diagnosis not present

## 2016-11-14 LAB — BASIC METABOLIC PANEL
BUN: 27 — AB (ref 4–21)
Creatinine: 1.1 (ref 0.5–1.1)
Glucose: 93
Potassium: 4.2 (ref 3.4–5.3)
Sodium: 133 — AB (ref 137–147)

## 2016-11-15 ENCOUNTER — Encounter: Payer: Self-pay | Admitting: *Deleted

## 2016-11-17 ENCOUNTER — Encounter: Payer: Self-pay | Admitting: Family

## 2016-11-17 ENCOUNTER — Non-Acute Institutional Stay: Payer: Medicare Other | Admitting: Family

## 2016-11-17 DIAGNOSIS — K219 Gastro-esophageal reflux disease without esophagitis: Secondary | ICD-10-CM | POA: Diagnosis not present

## 2016-11-17 DIAGNOSIS — E039 Hypothyroidism, unspecified: Secondary | ICD-10-CM | POA: Diagnosis not present

## 2016-11-17 DIAGNOSIS — I10 Essential (primary) hypertension: Secondary | ICD-10-CM | POA: Diagnosis not present

## 2016-11-17 DIAGNOSIS — K5901 Slow transit constipation: Secondary | ICD-10-CM | POA: Diagnosis not present

## 2016-11-17 DIAGNOSIS — B379 Candidiasis, unspecified: Secondary | ICD-10-CM | POA: Diagnosis not present

## 2016-11-20 DIAGNOSIS — E876 Hypokalemia: Secondary | ICD-10-CM | POA: Diagnosis not present

## 2016-11-25 ENCOUNTER — Encounter: Payer: Self-pay | Admitting: Family

## 2016-11-25 ENCOUNTER — Non-Acute Institutional Stay: Payer: Medicare Other | Admitting: Family

## 2016-11-25 DIAGNOSIS — B370 Candidal stomatitis: Secondary | ICD-10-CM | POA: Diagnosis not present

## 2016-11-25 DIAGNOSIS — B372 Candidiasis of skin and nail: Secondary | ICD-10-CM | POA: Diagnosis not present

## 2016-11-25 DIAGNOSIS — I10 Essential (primary) hypertension: Secondary | ICD-10-CM

## 2016-11-25 MED ORDER — HYDRALAZINE HCL 50 MG PO TABS: 50.0000 mg | ORAL_TABLET | Freq: Three times a day (TID) | ORAL | Status: DC

## 2016-11-25 MED ORDER — NYSTATIN 100000 UNIT/ML MT SUSP: 5.0000 mL | Freq: Four times a day (QID) | OROMUCOSAL | Status: AC

## 2016-11-25 NOTE — Progress Notes (Signed)
Location:  [place] Room Number: 35 Place of Service:  ALF [phone]) Provider: Zendaya Groseclose FNP-C  Blanchie Serve, MD  Patient Care Team: Blanchie Serve, MD as PCP - General (Internal Medicine) Mast, Man X, NP as Nurse Practitioner (Nurse Practitioner)  Extended Emergency Contact Information Primary Emergency Contact: Norland,[name] Address: [phone] [place]          [place], [place] [address] [name] of [place] Phone: [phone] Mobile Phone: [phone] Relation: Spouse Secondary Emergency Contact: Procida,[name] Address: [address]          [place], [place] [address] [name] of [place] Mobile Phone: [phone] Relation: Son  Code Status:  DNR Goals of care: Advanced Directive information Advanced Directives 11/25/2016  Does Patient Have a Medical Advance Directive? Yes  Type of Advance Directive Out of facility DNR (pink MOST or yellow form);Living will;Healthcare Power of Attorney  Does patient want to make changes to medical advance directive? -  Copy of [place] in Chart? Yes  Would patient like information on creating a medical advance directive? -  Pre-existing out of facility DNR order (yellow form or pink MOST form) Yellow form placed in chart (order not valid for inpatient use)     Chief Complaint  Patient presents with  . Acute Visit    BP follow up    HPI:  Pt is a 83 y.o. female seen today at [facility] for an acute visit for evaluation of high blood pressure.she has a significant medical history of HTN,CKD stage 2,Anemia,OA among other conditions.She is seen in her room today with son at bedside.Her blood pressure log reviewed readings have improved from previous readings. B/p ranging in the 140's/80's-170's/ 80's and has x 3 episodes in 180's/100's. Overall B/p has improved compared to previous visit. She states had a headache this morning when she woke up. She denies any dizziness,  shortness of breath or chest pain.    Past Medical History:  Diagnosis Date  . Acquired cyst of kidney    left  . Allergic rhinitis, cause unspecified   . Anemia, unspecified   . Chronic hyponatremia 09/05/2015  . CKD stage 2 due to type 2 diabetes mellitus ([place]) 11/18/2014  . Closed fracture of unspecified part of vertebral column without mention of spinal cord injury    T7 s/p kyphoplasty, T12  . Edema 2015  . Herpes simplex without mention of complication   . History of Epstein-Barr virus infection   . Hyposmolality and/or hyponatremia    07/13/15 Na 131  . Insomnia   . Insomnia, unspecified   . Left cavernous carotid aneurysm 08/2008   1-1/72mm aneurysm of cavernous carotid artery  . Malignant neoplasm of breast (female), unspecified site 1990   left. Had surgerey and chemo, but no radiation  . 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 Neuropathy   . Osteoarthrosis of knee    bilaterally  . Osteoarthrosis, hip   . Osteoarthrosis, unspecified whether generalized or localized, forearm    bilaterally wrist  . Other and unspecified nonspecific immunological findings    positive ANA  . Other B-complex deficiencies   . Pain in joint, site unspecified    severe, diffuse, chronic pain  . Positive ANA (antinuclear antibody)   . Pulmonary embolism ([place]) 05/07/2015  . Pure hypercholesterolemia   . Senile osteoporosis   . Unspecified essential hypertension   . Unspecified gastritis and  gastroduodenitis without mention of hemorrhage   . Unspecified hypothyroidism   . Unspecified vitamin D deficiency    Past Surgical History:  Procedure Laterality Date  . DILATION AND CURETTAGE OF UTERUS  X 2  . FEMUR IM NAIL Right 03/18/2015   Procedure: INTRAMEDULLARY (IM) NAIL FEMORAL;  Surgeon: Rod Can, MD;  Location: [facility]S;  Service: Orthopedics;  Laterality: Right;  . FRACTURE SURGERY    . KYPHOPLASTY  07/03/2010   T12  . KYPHOPLASTY     C7    . LAPAROSCOPIC CHOLECYSTECTOMY  09/20/2006  . MASTECTOMY Left 04/29/1989  . MIDDLE EAR SURGERY Bilateral 1938  . NASAL SINUS SURGERY  1990 & 2000  . ORIF HIP FRACTURE Left 06/13/2007   following a syncopal episode  . TONSILLECTOMY  1968    Allergies  Allergen Reactions  . Aspirin     Drop in body temp with large quantities, can tolerate low doses of aspirin  . Penicillins Swelling    Has patient had a PCN reaction causing immediate rash, facial/tongue/throat swelling, SOB or lightheadedness with hypotension: No Has patient had a PCN reaction causing severe rash involving mucus membranes or skin necrosis: No Has patient had a PCN reaction that required hospitalization No Has patient had a PCN reaction occurring within the last 10 years: No If all of the above answers are "NO", then may proceed with Cephalosporin use.    Allergies as of 11/25/2016      Reactions   Aspirin    Drop in body temp with large quantities, can tolerate low doses of aspirin   Penicillins Swelling   Has patient had a PCN reaction causing immediate rash, facial/tongue/throat swelling, SOB or lightheadedness with hypotension: No Has patient had a PCN reaction causing severe rash involving mucus membranes or skin necrosis: No Has patient had a PCN reaction that required hospitalization No Has patient had a PCN reaction occurring within the last 10 years: No If all of the above answers are "NO", then may proceed with Cephalosporin use.      Medication List       Accurate as of 11/25/16 11:53 AM. Always use your most recent med list.          acetaminophen 325 MG tablet Commonly known as:  TYLENOL Take 650 mg by mouth every 4 (four) hours as needed.   apixaban 5 MG Tabs tablet Commonly known as:  ELIQUIS Take 1 tablet (5 mg total) by mouth 2 (two) times daily.   atenolol 25 MG tablet Commonly known as:  TENORMIN Take 25 mg by mouth at bedtime.   cholecalciferol 1000 units tablet Commonly known as:   VITAMIN D Take 3,000 Units by mouth daily.   cloNIDine 0.1 MG tablet Commonly known as:  CATAPRES Take one tablet every 8 hours as needed for BP > 180/100   denosumab 60 MG/ML Soln injection Commonly known as:  PROLIA Inject 60 mg into the skin every 6 (six) months. Administer in upper arm, thigh, or abdomen   doxazosin 4 MG tablet Commonly known as:  CARDURA One daily to help control BP   feeding supplement Liqd Take 1 Container by mouth daily with supper.   hydrALAZINE 25 MG tablet Commonly known as:  APRESOLINE Take 25 mg by mouth 3 (three) times daily.   irbesartan 300 MG tablet Commonly known as:  AVAPRO Take 300 mg by mouth daily.   levothyroxine 150 MCG tablet Commonly known as:  SYNTHROID, LEVOTHROID Take 150 mcg by mouth daily  before breakfast.   Lidocaine 4 % Ptch Commonly known as:  ASPERCREME LIDOCAINE Apply 3 patches topically once. Apply 1 patch to the left hip, left knee, and the right shoulder in the morning. Remove in the evening.   loratadine 10 MG tablet Commonly known as:  CLARITIN Take 10 mg by mouth daily. Reported on 09/22/2015   nystatin cream Commonly known as:  MYCOSTATIN Apply 1 application topically 2 (two) times daily. AAA to breast folds and upper abdomen   omeprazole 20 MG capsule Commonly known as:  PRILOSEC Take 1 capsule (20 mg total) by mouth daily.   oxyCODONE-acetaminophen 7.5-325 MG tablet Commonly known as:  PERCOCET Take 1 tablet by mouth 4 (four) times daily. Scheduled. Take 1 table by mouth daily as needed between 1 am and 6:30 am.   polyethylene glycol powder powder Commonly known as:  GLYCOLAX/MIRALAX Take 17 grams in 6 oz water or juice daily to prevent constipation   Psyllium 28.3 % Powd Commonly known as:  METAMUCIL SMOOTH TEXTURE One tablespoon in 6 oz wsater or juice daily to help prevent constipation   ROBAFEN DM 10-100 MG/5ML liquid Generic drug:  Dextromethorphan-Guaifenesin Take 5 mLs by mouth every 6 (six)  hours as needed.   senna-docusate 8.6-50 MG tablet Commonly known as:  SENOKOT S 2 tablets nightly to prevent constipation   traZODone 50 MG tablet Commonly known as:  DESYREL Take 50 mg by mouth at bedtime.   VITAMIN B-12 CR PO Take 1,000 mcg by mouth daily.       Review of Systems  Constitutional: Negative for activity change, appetite change, chills, fatigue and fever.  HENT: Negative for congestion, rhinorrhea, sinus pain, sinus pressure, sneezing and sore throat.   Eyes: Negative.   Respiratory: Negative for cough, chest tightness, shortness of breath and wheezing.   Cardiovascular: Negative for chest pain, palpitations and leg swelling.  Gastrointestinal: Negative for abdominal distention, abdominal pain, constipation, diarrhea and nausea.  Genitourinary: Negative for difficulty urinating, dysuria, flank pain, frequency and urgency.  Musculoskeletal: Positive for arthralgias and gait problem.  Skin: Negative for color change, pallor and rash.  Neurological: Negative for dizziness, seizures, syncope, light-headedness and headaches.  Hematological: Does not bruise/bleed easily.  Psychiatric/Behavioral: Negative for agitation, confusion, hallucinations and sleep disturbance. The patient is not nervous/anxious.     Immunization History  Administered Date(s) Administered  . Influenza Split 02/13/2013  . Influenza-Unspecified 02/27/2014, 02/12/2015, 02/25/2016  . PPD Test 09/09/2015  . Pneumococcal Polysaccharide-23 02/13/2013  . Pneumococcal-Unspecified 05/05/2016  . Td 05/05/2016  . Zoster 05/04/2016   Pertinent  Health Maintenance Due  Topic Date Due  . MAMMOGRAM  09/09/2017 (Originally 03/21/1945)  . INFLUENZA VACCINE  12/14/2016  . PNA vac Low Risk Adult (2 of 2 - PCV13) 05/05/2017  . DEXA SCAN  Completed   Fall Risk  11/10/2015 10/20/2015 10/20/2015 06/30/2015 03/30/2015  Falls in the past year? No Yes No No Yes  Number falls in past yr: - 2 or more - - 2 or more    Injury with Fall? - Yes - - Yes  Risk Factor Category  - - - - High Fall Risk  Risk for fall due to : - History of fall(s);Impaired balance/gait - - History of fall(s);Impaired balance/gait;Impaired mobility  Follow up - - - - Falls evaluation completed    Vitals:   11/25/16 1021  BP: (!) 178/92  Pulse: 64  Resp: 16  Temp: 97.7 F (36.5 C)  SpO2: 95%  Weight: 140 lb 12.8  oz (63.9 kg)  Height: 5\' 8"  (1.727 m)   Body mass index is 21.41 kg/m. Physical Exam  Constitutional: She is oriented to person, place, and time. She appears well-developed and well-nourished. No distress.  HENT:  Head: Normocephalic.  Right Ear: External ear normal.  Left Ear: External ear normal.  Mouth/Throat: Oropharynx is clear and moist. No oropharyngeal exudate.  Whitish patch noted on tongue   Eyes: Pupils are equal, round, and reactive to light. Conjunctivae and EOM are normal. Right eye exhibits no discharge. Left eye exhibits no discharge. No scleral icterus.  Neck: Normal range of motion. No JVD present. No thyromegaly present.  Cardiovascular: Normal rate, normal heart sounds and intact distal pulses.  Exam reveals no gallop and no friction rub.   No murmur heard. Pulmonary/Chest: Effort normal and breath sounds normal. No respiratory distress. She has no wheezes. She has no rales.  Abdominal: Soft. Bowel sounds are normal. She exhibits no distension. There is no tenderness. There is no rebound and no guarding.  Musculoskeletal: She exhibits no tenderness or deformity.  Unsteady gait . Bilateral lower extremities trace-1+ edema.   Lymphadenopathy:    She has no cervical adenopathy.  Neurological: She is oriented to person, place, and time. Coordination normal.  Skin: Skin is warm and dry. No rash noted. No pallor.  Previous upper abdominal folds redness has improved.   Psychiatric: She has a normal mood and affect.    Labs reviewed:  Recent Labs  10/27/16 11/07/16 11/14/16  NA 132* 133*  133*  K 4.3 4.3 4.2  BUN 24* 25* 27*  CREATININE 1.1 1.1 1.1    Recent Labs  06/23/16  AST 17  ALT 8  ALKPHOS 52    Recent Labs  06/23/16  WBC 5.6  HGB 12.3  HCT 36  PLT 159   Lab Results  Component Value Date   TSH 2.80 10/27/2016   No results found for: HGBA1C Lab Results  Component Value Date   CHOL 175 11/10/2014   HDL 53 11/10/2014   LDLCALC 87 11/10/2014   TRIG 173 (A) 11/10/2014    Significant Diagnostic Results in last 30 days:  No results found.  Assessment/Plan 1. Uncontrolled hypertension B/p ranging in the 140's/80's-170's/ 80's and has x 3 episodes in 180's/100's.will continue on Avapro 300 mg tablet daily,clonidine 0.1mg  Tablet PRN, Atenolol 25 mg Tablet at bedtime. Increase Hydralazine to 50 mg Tablet three times daily. Monitor B/p every shift.notify provider if B/p readings > 150/90.   2. Oral thrush Whitish patches noted on tongue. Start Nystatin 100,000 units /ml suspension swish 5 mls by mouth four times daily X 14 days.   3. Candidiasis of skin Upper abdominal /breast skin fold beefy redness has improved. Continue on Nystatin cream. Continue to monitor.   Family/ staff Communication: Reviewed plan of care with patient and facility Nurse.   Labs/tests ordered: None   Marisa Hufstetler C Darrold Bezek, NP

## 2016-11-27 NOTE — Progress Notes (Signed)
Location:  [facility] Room Number: 35 Place of Service:  ALF [phone]) Provider: Dinah Ngetich FNP-C   Blanchie Serve, MD  Patient Care Team: Blanchie Serve, MD as PCP - General (Internal Medicine) Mast, Man X, NP as Nurse Practitioner (Nurse Practitioner)  Extended Emergency Contact Information Primary Emergency Contact: Bailey [name] Address: [phone] [place]          [place], [place] [address] Bailey [name] of [place] Phone: [phone] Mobile Phone: [phone] Relation: Spouse Secondary Emergency Contact: Bailey [name] Address: [address]          [place],  [address] Bailey [name] of [place] Mobile Phone: [phone] Relation: Son  Code Status:  DNR Goals of care: Advanced Directive information Advanced Directives 11/25/2016  Does Patient Have a Medical Advance Directive? Yes  Type of Advance Directive Out of facility DNR (pink MOST or yellow form);Living will;Healthcare Power of Attorney  Does patient want to make changes to medical advance directive? -  Copy of [place] in Chart? Yes  Would patient like information on creating a medical advance directive? -  Pre-existing out of facility DNR order (yellow form or pink MOST form) Yellow form placed in chart (order not valid for inpatient use)     Chief Complaint  Patient presents with  . Medical Management of Chronic Issues    routine visit and blood pressure issues    HPI:  Pt is a 83 y.o. female seen today Bailey [name] for medical management of chronic diseases. She has a medical histroy of HTN, PE on EliQuis, GERD, hypothyroidism, OA, CKD stage 2,Breast cancer, Osteoporosis among other conditions.she is seen in her room today. She is worried that her blood pressure has been running high. She denies any dizziness, fatigue,chest pain or shortness of breath.No recent fall episodes or hospital admission.Facility Nurse reports no new concerns. Facility  Blood pressure log reviewed readings ranging in the 140's/80's-200's/110's.     Past Medical History:  Diagnosis Date  . Acquired cyst of kidney    left  . Allergic rhinitis, cause unspecified   . Anemia, unspecified   . Chronic hyponatremia 09/05/2015  . CKD stage 2 due to type 2 diabetes mellitus ([place]) 11/18/2014  . Closed fracture of unspecified part of vertebral column without mention of spinal cord injury    T7 s/p kyphoplasty, T12  . Edema 2015  . Herpes simplex without mention of complication   . History of Epstein-Barr virus infection   . Hyposmolality and/or hyponatremia    07/13/15 Na 131  . Insomnia   . Insomnia, unspecified   . Left cavernous carotid aneurysm 08/2008   1-1/35mm aneurysm of cavernous carotid artery  . Malignant neoplasm of breast (female), unspecified site 1990   left. Had surgerey and chemo, but no radiation  . 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 Neuropathy   . Osteoarthrosis of knee    bilaterally  . Osteoarthrosis, hip   . Osteoarthrosis, unspecified whether generalized or localized, forearm    bilaterally wrist  . Other and unspecified nonspecific immunological findings    positive ANA  . Other B-complex deficiencies   . Pain in joint, site unspecified    severe, diffuse, chronic pain  . Positive ANA (antinuclear antibody)   . Pulmonary embolism ([facility]) 05/07/2015  . Pure hypercholesterolemia   . Senile osteoporosis   . Unspecified essential hypertension   . Unspecified gastritis and gastroduodenitis without  mention of hemorrhage   . Unspecified hypothyroidism   . Unspecified vitamin D deficiency    Past Surgical History:  Procedure Laterality Date  . DILATION AND CURETTAGE OF UTERUS  X 2  . FEMUR IM NAIL Right 03/18/2015   Procedure: INTRAMEDULLARY (IM) NAIL FEMORAL;  Surgeon: Rod Can, MD;  Location: [facility];  Service: Orthopedics;  Laterality: Right;  . FRACTURE SURGERY    . KYPHOPLASTY   07/03/2010   T12  . KYPHOPLASTY     C7  . LAPAROSCOPIC CHOLECYSTECTOMY  09/20/2006  . MASTECTOMY Left 04/29/1989  . MIDDLE EAR SURGERY Bilateral 1938  . NASAL SINUS SURGERY  1990 & 2000  . ORIF HIP FRACTURE Left 06/13/2007   following a syncopal episode  . TONSILLECTOMY  1968    Allergies  Allergen Reactions  . Aspirin     Drop in body temp with large quantities, can tolerate low doses of aspirin  . Penicillins Swelling    Has patient had a PCN reaction causing immediate rash, facial/tongue/throat swelling, SOB or lightheadedness with hypotension: No Has patient had a PCN reaction causing severe rash involving mucus membranes or skin necrosis: No Has patient had a PCN reaction that required hospitalization No Has patient had a PCN reaction occurring within the last 10 years: No If all of the above answers are "NO", then may proceed with Cephalosporin use.    Allergies as of 11/17/2016      Reactions   Aspirin    Drop in body temp with large quantities, can tolerate low doses of aspirin   Penicillins Swelling   Has patient had a PCN reaction causing immediate rash, facial/tongue/throat swelling, SOB or lightheadedness with hypotension: No Has patient had a PCN reaction causing severe rash involving mucus membranes or skin necrosis: No Has patient had a PCN reaction that required hospitalization No Has patient had a PCN reaction occurring within the last 10 years: No If all of the above answers are "NO", then may proceed with Cephalosporin use.      Medication List       Accurate as of 11/17/16 11:59 PM. Always use your most recent med list.          acetaminophen 325 MG tablet Commonly known as:  TYLENOL Take 650 mg by mouth every 4 (four) hours as needed.   apixaban 5 MG Tabs tablet Commonly known as:  ELIQUIS Take 1 tablet (5 mg total) by mouth 2 (two) times daily.   atenolol 25 MG tablet Commonly known as:  TENORMIN Take 25 mg by mouth at bedtime.   cholecalciferol  1000 units tablet Commonly known as:  VITAMIN D Take 3,000 Units by mouth daily.   cloNIDine 0.1 MG tablet Commonly known as:  CATAPRES Take one tablet every 8 hours as needed for BP > 180/100   denosumab 60 MG/ML Soln injection Commonly known as:  PROLIA Inject 60 mg into the skin every 6 (six) months. Administer in upper arm, thigh, or abdomen   doxazosin 4 MG tablet Commonly known as:  CARDURA One daily to help control BP   feeding supplement Liqd Take 1 Container by mouth daily with supper.   irbesartan 300 MG tablet Commonly known as:  AVAPRO Take 300 mg by mouth daily.   levothyroxine 150 MCG tablet Commonly known as:  SYNTHROID, LEVOTHROID Take 150 mcg by mouth daily before breakfast.   Lidocaine 4 % Ptch Commonly known as:  ASPERCREME LIDOCAINE Apply 3 patches topically once. Apply 1 patch to  the left hip, left knee, and the right shoulder in the morning. Remove in the evening.   loratadine 10 MG tablet Commonly known as:  CLARITIN Take 10 mg by mouth daily. Reported on 09/22/2015   omeprazole 20 MG capsule Commonly known as:  PRILOSEC Take 1 capsule (20 mg total) by mouth daily.   oxyCODONE-acetaminophen 7.5-325 MG tablet Commonly known as:  PERCOCET Take 1 tablet by mouth 4 (four) times daily. Scheduled. Take 1 table by mouth daily as needed between 1 am and 6:30 am.   polyethylene glycol powder powder Commonly known as:  GLYCOLAX/MIRALAX Take 17 grams in 6 oz water or juice daily to prevent constipation   Psyllium 28.3 % Powd Commonly known as:  METAMUCIL SMOOTH TEXTURE One tablespoon in 6 oz wsater or juice daily to help prevent constipation   ROBAFEN DM 10-100 MG/5ML liquid Generic drug:  Dextromethorphan-Guaifenesin Take 5 mLs by mouth every 6 (six) hours as needed.   senna-docusate 8.6-50 MG tablet Commonly known as:  SENOKOT S 2 tablets nightly to prevent constipation   traZODone 50 MG tablet Commonly known as:  DESYREL Take 50 mg by mouth at  bedtime.   VITAMIN B-12 CR PO Take 1,000 mcg by mouth daily.       Review of Systems  Constitutional: Negative for activity change, appetite change, fatigue and fever.  HENT: Negative for congestion, rhinorrhea, sinus pain, sinus pressure, sneezing and sore throat.   Eyes: Negative.   Respiratory: Negative for cough, chest tightness, shortness of breath and wheezing.   Cardiovascular: Negative for chest pain, palpitations and leg swelling.  Gastrointestinal: Negative for abdominal distention, abdominal pain, constipation, diarrhea, nausea and vomiting.  Endocrine: Negative.   Genitourinary: Negative for dysuria, flank pain, frequency and urgency.  Musculoskeletal: Positive for gait problem.       Chronic shoulder and knee pain current pain regimen effective.   Skin: Negative for color change, pallor, rash and wound.  Neurological: Negative for dizziness, tremors, seizures, light-headedness and headaches.  Hematological: Does not bruise/bleed easily.  Psychiatric/Behavioral: Negative for agitation, confusion, hallucinations and sleep disturbance. The patient is not nervous/anxious.     Immunization History  Administered Date(s) Administered  . Influenza Split 02/13/2013  . Influenza-Unspecified 02/27/2014, 02/12/2015, 02/25/2016  . PPD Test 09/09/2015  . Pneumococcal Polysaccharide-23 02/13/2013  . Pneumococcal-Unspecified 05/05/2016  . Td 05/05/2016  . Zoster 05/04/2016   Pertinent  Health Maintenance Due  Topic Date Due  . MAMMOGRAM  09/09/2017 (Originally 03/21/1945)  . INFLUENZA VACCINE  12/14/2016  . PNA vac Low Risk Adult (2 of 2 - PCV13) 05/05/2017  . DEXA SCAN  Completed   Fall Risk  11/10/2015 10/20/2015 10/20/2015 06/30/2015 03/30/2015  Falls in the past year? No Yes No No Yes  Number falls in past yr: - 2 or more - - 2 or more  Injury with Fall? - Yes - - Yes  Risk Factor Category  - - - - High Fall Risk  Risk for fall due to : - History of fall(s);Impaired  balance/gait - - History of fall(s);Impaired balance/gait;Impaired mobility  Follow up - - - - Falls evaluation completed    Vitals:   11/17/16 1154  BP: (!) 206/114  Pulse: (!) 57  Resp: 18  Temp: 97.7 F (36.5 C)  SpO2: 97%  Weight: 140 lb 12.8 oz (63.9 kg)  Height: 5\' 8"  (1.727 m)   Body mass index is 21.41 kg/m. Physical Exam  Constitutional: She is oriented to person, place, and time. She  appears well-developed and well-nourished. No distress.  HENT:  Head: Normocephalic.  Right Ear: External ear normal.  Left Ear: External ear normal.  Mouth/Throat: Oropharynx is clear and moist. No oropharyngeal exudate.  Eyes: Pupils are equal, round, and reactive to light. Conjunctivae and EOM are normal. Right eye exhibits no discharge. Left eye exhibits no discharge. No scleral icterus.  Neck: Normal range of motion. No JVD present. No thyromegaly present.  Cardiovascular: Normal rate, regular rhythm, normal heart sounds and intact distal pulses.  Exam reveals no gallop and no friction rub.   No murmur heard. Pulmonary/Chest: Effort normal and breath sounds normal. No respiratory distress. She has no wheezes. She has no rales.  Abdominal: Soft. Bowel sounds are normal. She exhibits no distension. There is no tenderness. There is no rebound and no guarding.  Musculoskeletal: She exhibits no tenderness or deformity.  Unsteady gait. Left arm lymphedema   Lymphadenopathy:    She has no cervical adenopathy.  Neurological: She is oriented to person, place, and time.  Skin: Skin is warm and dry. No rash noted. No erythema. No pallor.  Upper abdominal/breast skin fold beefy redness  Psychiatric: She has a normal mood and affect.    Labs reviewed:  Recent Labs  10/27/16 11/07/16 11/14/16  NA 132* 133* 133*  K 4.3 4.3 4.2  BUN 24* 25* 27*  CREATININE 1.1 1.1 1.1    Recent Labs  06/23/16  AST 17  ALT 8  ALKPHOS 52    Recent Labs  06/23/16  WBC 5.6  HGB 12.3  HCT 36  PLT  159   Lab Results  Component Value Date   TSH 2.80 10/27/2016   No results found for: HGBA1C Lab Results  Component Value Date   CHOL 175 11/10/2014   HDL 53 11/10/2014   LDLCALC 87 11/10/2014   TRIG 173 (A) 11/10/2014    Significant Diagnostic Results in last 30 days:  No results found.  Assessment/Plan 1. Essential hypertension asymptomatic.Blood pressure log reviewed readings ranging in the 140's/80's-200's/110's. Has required clonidine PRN.continue on Avapro 300 mg tablet daily. Start Hydralazine 25 mg tablet three times daily. Monitor B/P every shift. Notify provider for 3 consecutive readings of B/P > 150/90.  2. Candidiasis Upper abdominal/breast skin fold beefy redness. Start Nystatin 100,000 units cream apply twice daily to affected areas x 14 days. Continue to monitor.   3. Hypothyroidism Lab Results  Component Value Date   TSH 2.80 10/27/2016  Continue on levothyroxine 150 mcg tablet daily. Monitor TSH level.   4. Gastroesophageal reflux disease without esophagitis Stable.Continue on Omeprazole 20 mg capsule.Will change to as needed if symptoms remains stable.  5. Slow transit constipation Current regimen effective.continue to encourage hydration.   Family/ staff Communication: Reviewed plan of care with patient and facility Nurse supervisor   Labs/tests ordered: None   Sandrea Hughs, NP

## 2016-11-28 DIAGNOSIS — N182 Chronic kidney disease, stage 2 (mild): Secondary | ICD-10-CM | POA: Diagnosis not present

## 2016-11-29 ENCOUNTER — Non-Acute Institutional Stay: Payer: Medicare Other | Admitting: Family

## 2016-11-29 DIAGNOSIS — E871 Hypo-osmolality and hyponatremia: Secondary | ICD-10-CM

## 2016-11-29 MED ORDER — CALCIUM CITRATE 950 (200 CA) MG PO TABS: 200.0000 mg | ORAL_TABLET | Freq: Every day | ORAL | Status: DC

## 2016-11-29 NOTE — Progress Notes (Signed)
Location:  [facility] Room Number: 35 Place of Service:  ALF [phone]) Provider: Dinah Ngetich FNP-C  Blanchie Serve, MD  Patient Care Team: Blanchie Serve, MD as PCP - General (Internal Medicine) Mast, Man X, NP as Nurse Practitioner (Nurse Practitioner)  Extended Emergency Contact Information Primary Emergency Contact: Mutschler,[name] Address: [phone] [place]          [place], [place] [address] [name] of [place] Phone: [phone] Mobile Phone: [phone] Relation: Spouse Secondary Emergency Contact: Mohr,[name] Address: [address]          [place],  [address] [name] of [place] Mobile Phone: [phone] Relation: Son  Code Status:  DNR Goals of care: Advanced Directive information Advanced Directives 11/25/2016  Does Patient Have a Medical Advance Directive? Yes  Type of Advance Directive Out of facility DNR (pink MOST or yellow form);Living will;Healthcare Power of Attorney  Does patient want to make changes to medical advance directive? -  Copy of [place] in Chart? Yes  Would patient like information on creating a medical advance directive? -  Pre-existing out of facility DNR order (yellow form or pink MOST form) Yellow form placed in chart (order not valid for inpatient use)     Chief Complaint  Patient presents with  . Acute Visit    abnormal lab results     HPI:  Pt is a 83 y.o. female seen today at [facility] for an acute visit for evaluation of abnormal lab results. She is seen in her room today. She continues to complain of chronic pain on the knees, left hip , right shoulder and back pain. She states current pain medication eases off the pain but does not take the pain off completely. Her recent lab results showed Na+ 128, Cl 97,Ca 8.3 ( 11/28/2016).she states has had low sodium level in the past.she denies any headache, nausea, vomiting or seizures.      Past Medical History:   Diagnosis Date  . Acquired cyst of kidney    left  . Allergic rhinitis, cause unspecified   . Anemia, unspecified   . Chronic hyponatremia 09/05/2015  . CKD stage 2 due to type 2 diabetes mellitus ([place]) 11/18/2014  . Closed fracture of unspecified part of vertebral column without mention of spinal cord injury    T7 s/p kyphoplasty, T12  . Edema 2015  . Herpes simplex without mention of complication   . History of Epstein-Barr virus infection   . Hyposmolality and/or hyponatremia    07/13/15 Na 131  . Insomnia   . Insomnia, unspecified   . Left cavernous carotid aneurysm 08/2008   1-1/46mm aneurysm of cavernous carotid artery  . Malignant neoplasm of breast (female), unspecified site 1990   left. Had surgerey and chemo, but no radiation  . 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 Neuropathy   . Osteoarthrosis of knee    bilaterally  . Osteoarthrosis, hip   . Osteoarthrosis, unspecified whether generalized or localized, forearm    bilaterally wrist  . Other and unspecified nonspecific immunological findings    positive ANA  . Other B-complex deficiencies   . Pain in joint, site unspecified    severe, diffuse, chronic pain  . Positive ANA (antinuclear antibody)   . Pulmonary embolism ([place]) 05/07/2015  . Pure hypercholesterolemia   . Senile osteoporosis   . Unspecified essential hypertension   . Unspecified gastritis and gastroduodenitis  without mention of hemorrhage   . Unspecified hypothyroidism   . Unspecified vitamin D deficiency    Past Surgical History:  Procedure Laterality Date  . DILATION AND CURETTAGE OF UTERUS  X 2  . FEMUR IM NAIL Right 03/18/2015   Procedure: INTRAMEDULLARY (IM) NAIL FEMORAL;  Surgeon: Rod Can, MD;  Location: [facility] ORS;  Service: Orthopedics;  Laterality: Right;  . FRACTURE SURGERY    . KYPHOPLASTY  07/03/2010   T12  . KYPHOPLASTY     C7  . LAPAROSCOPIC CHOLECYSTECTOMY  09/20/2006  . MASTECTOMY Left  04/29/1989  . MIDDLE EAR SURGERY Bilateral 1938  . NASAL SINUS SURGERY  1990 & 2000  . ORIF HIP FRACTURE Left 06/13/2007   following a syncopal episode  . TONSILLECTOMY  1968    Allergies  Allergen Reactions  . Aspirin     Drop in body temp with large quantities, can tolerate low doses of aspirin  . Penicillins Swelling    Has patient had a PCN reaction causing immediate rash, facial/tongue/throat swelling, SOB or lightheadedness with hypotension: No Has patient had a PCN reaction causing severe rash involving mucus membranes or skin necrosis: No Has patient had a PCN reaction that required hospitalization No Has patient had a PCN reaction occurring within the last 10 years: No If all of the above answers are "NO", then may proceed with Cephalosporin use.    Allergies as of 11/29/2016      Reactions   Aspirin    Drop in body temp with large quantities, can tolerate low doses of aspirin   Penicillins Swelling   Has patient had a PCN reaction causing immediate rash, facial/tongue/throat swelling, SOB or lightheadedness with hypotension: No Has patient had a PCN reaction causing severe rash involving mucus membranes or skin necrosis: No Has patient had a PCN reaction that required hospitalization No Has patient had a PCN reaction occurring within the last 10 years: No If all of the above answers are "NO", then may proceed with Cephalosporin use.      Medication List       Accurate as of 11/29/16  2:30 PM. Always use your most recent med list.          acetaminophen 325 MG tablet Commonly known as:  TYLENOL Take 650 mg by mouth every 4 (four) hours as needed.   apixaban 5 MG Tabs tablet Commonly known as:  ELIQUIS Take 1 tablet (5 mg total) by mouth 2 (two) times daily.   atenolol 25 MG tablet Commonly known as:  TENORMIN Take 25 mg by mouth at bedtime.   calcium citrate 950 MG tablet Commonly known as:  CALCITRATE Take 1 tablet (200 mg of elemental calcium total) by  mouth daily.   cholecalciferol 1000 units tablet Commonly known as:  VITAMIN D Take 3,000 Units by mouth daily.   cloNIDine 0.1 MG tablet Commonly known as:  CATAPRES Take one tablet every 8 hours as needed for BP > 180/100   denosumab 60 MG/ML Soln injection Commonly known as:  PROLIA Inject 60 mg into the skin every 6 (six) months. Administer in upper arm, thigh, or abdomen   doxazosin 4 MG tablet Commonly known as:  CARDURA One daily to help control BP   feeding supplement Liqd Take 1 Container by mouth daily with supper.   hydrALAZINE 50 MG tablet Commonly known as:  APRESOLINE Take 1 tablet (50 mg total) by mouth 3 (three) times daily.   irbesartan 300 MG tablet Commonly known as:  AVAPRO Take 300 mg by mouth daily.   levothyroxine 150 MCG tablet Commonly known as:  SYNTHROID, LEVOTHROID Take 150 mcg by mouth daily before breakfast.   Lidocaine 4 % Ptch Commonly known as:  ASPERCREME LIDOCAINE Apply 3 patches topically once. Apply 1 patch to the left hip, left knee, and the right shoulder in the morning. Remove in the evening.   loratadine 10 MG tablet Commonly known as:  CLARITIN Take 10 mg by mouth daily. Reported on 09/22/2015   nystatin cream Commonly known as:  MYCOSTATIN Apply 1 application topically 2 (two) times daily. AAA to breast folds and upper abdomen   omeprazole 20 MG capsule Commonly known as:  PRILOSEC Take 1 capsule (20 mg total) by mouth daily.   oxyCODONE-acetaminophen 7.5-325 MG tablet Commonly known as:  PERCOCET Take 1 tablet by mouth 4 (four) times daily. Scheduled. Take 1 table by mouth daily as needed between 1 am and 6:30 am.   polyethylene glycol powder powder Commonly known as:  GLYCOLAX/MIRALAX Take 17 grams in 6 oz water or juice daily to prevent constipation   Psyllium 28.3 % Powd Commonly known as:  METAMUCIL SMOOTH TEXTURE One tablespoon in 6 oz wsater or juice daily to help prevent constipation   ROBAFEN DM 10-100  MG/5ML liquid Generic drug:  Dextromethorphan-Guaifenesin Take 5 mLs by mouth every 6 (six) hours as needed.   senna-docusate 8.6-50 MG tablet Commonly known as:  SENOKOT S 2 tablets nightly to prevent constipation   traZODone 50 MG tablet Commonly known as:  DESYREL Take 50 mg by mouth at bedtime.   VITAMIN B-12 CR PO Take 1,000 mcg by mouth daily.       Review of Systems  Constitutional: Negative for activity change, appetite change, chills, fatigue and fever.  HENT: Negative for congestion, sinus pain, sinus pressure, sneezing and sore throat.   Eyes: Negative.   Respiratory: Negative for cough, chest tightness, shortness of breath and wheezing.   Cardiovascular: Positive for leg swelling. Negative for chest pain and palpitations.  Gastrointestinal: Negative for abdominal distention, abdominal pain, constipation, diarrhea, nausea and vomiting.  Endocrine: Negative for cold intolerance, heat intolerance, polydipsia, polyphagia and polyuria.  Genitourinary: Negative for dysuria, frequency and urgency.  Musculoskeletal: Positive for back pain and gait problem.       Chronic knee,left hip, right shoulder and back pain   Skin: Negative for color change, pallor, rash and wound.  Neurological: Negative for dizziness, seizures, syncope, light-headedness and headaches.  Psychiatric/Behavioral: Negative for agitation, confusion, hallucinations and sleep disturbance. The patient is not nervous/anxious.     Immunization History  Administered Date(s) Administered  . Influenza Split 02/13/2013  . Influenza-Unspecified 02/27/2014, 02/12/2015, 02/25/2016  . PPD Test 09/09/2015  . Pneumococcal Polysaccharide-23 02/13/2013  . Pneumococcal-Unspecified 05/05/2016  . Td 05/05/2016  . Zoster 05/04/2016   Pertinent  Health Maintenance Due  Topic Date Due  . MAMMOGRAM  09/09/2017 (Originally 03/21/1945)  . INFLUENZA VACCINE  12/14/2016  . PNA vac Low Risk Adult (2 of 2 - PCV13) 05/05/2017    . DEXA SCAN  Completed   Fall Risk  11/10/2015 10/20/2015 10/20/2015 06/30/2015 03/30/2015  Falls in the past year? No Yes No No Yes  Number falls in past yr: - 2 or more - - 2 or more  Injury with Fall? - Yes - - Yes  Risk Factor Category  - - - - High Fall Risk  Risk for fall due to : - History of fall(s);Impaired balance/gait - - History  of fall(s);Impaired balance/gait;Impaired mobility  Follow up - - - - Falls evaluation completed    Vitals:   11/29/16 1100  BP: 120/62  Pulse: (!) 56  Resp: 18  Temp: 98.1 F (36.7 C)  SpO2: 95%  Weight: 140 lb 12.8 oz (63.9 kg)  Height: 5\' 8"  (1.727 m)   Body mass index is 21.41 kg/m. Physical Exam  Constitutional: She is oriented to person, place, and time. She appears well-developed and well-nourished. No distress.  HENT:  Head: Normocephalic.  Right Ear: External ear normal.  Left Ear: External ear normal.  Mouth/Throat: Oropharynx is clear and moist. No oropharyngeal exudate.  Eyes: Pupils are equal, round, and reactive to light. Conjunctivae and EOM are normal. Right eye exhibits no discharge. Left eye exhibits no discharge. No scleral icterus.  Neck: Normal range of motion. No JVD present. No thyromegaly present.  Cardiovascular: Normal rate, normal heart sounds and intact distal pulses.  Exam reveals no gallop and no friction rub.   No murmur heard. Pulmonary/Chest: Effort normal and breath sounds normal. No respiratory distress. She has no wheezes. She has no rales.  Abdominal: Soft. Bowel sounds are normal. She exhibits no distension. There is no tenderness. There is no rebound and no guarding.  Musculoskeletal: She exhibits no tenderness or deformity.  Unsteady gait.Bilat. lower extremities 1+ edema.   Lymphadenopathy:    She has no cervical adenopathy.  Neurological: She is oriented to person, place, and time. Coordination normal.  Skin: Skin is warm and dry. No rash noted. No pallor.  Whitish patch on tongue and upper abdominal  folds redness has improved.   Psychiatric: She has a normal mood and affect.    Labs reviewed:  Recent Labs  10/27/16 11/07/16 11/14/16  NA 132* 133* 133*  K 4.3 4.3 4.2  BUN 24* 25* 27*  CREATININE 1.1 1.1 1.1    Recent Labs  06/23/16  AST 17  ALT 8  ALKPHOS 52    Recent Labs  06/23/16  WBC 5.6  HGB 12.3  HCT 36  PLT 159   Lab Results  Component Value Date   TSH 2.80 10/27/2016   No results found for: HGBA1C Lab Results  Component Value Date   CHOL 175 11/10/2014   HDL 53 11/10/2014   LDLCALC 87 11/10/2014   TRIG 173 (A) 11/10/2014    Significant Diagnostic Results in last 30 days:  No results found.  Assessment/Plan 1. Hyponatremia Na + 128 previous 132. Asymptomatic. Continue to monitor. Recheck BMP 11/29/2016. Will consider decreasing Trazodone if sodium level remains in the lower ranger. Recheck BMP 12/01/2016.   2. Hypocalcemia Ca 8.3 ( 11/28/2016). Start Calcium Citrate 950 mg Tablet take one by mouth daily. Continue to monitor.   Family/ staff Communication: Reviewed plan of care with patient and facility Nurse   Labs/tests ordered:  BMP 12/01/2016   Sandrea Hughs, NP

## 2016-12-05 ENCOUNTER — Encounter: Payer: Self-pay | Admitting: *Deleted

## 2016-12-05 DIAGNOSIS — I10 Essential (primary) hypertension: Secondary | ICD-10-CM | POA: Diagnosis not present

## 2016-12-05 DIAGNOSIS — E876 Hypokalemia: Secondary | ICD-10-CM | POA: Diagnosis not present

## 2016-12-05 LAB — BASIC METABOLIC PANEL
BUN: 23 — AB (ref 4–21)
Creatinine: 1.1 (ref 0.5–1.1)
Glucose: 90
POTASSIUM: 4.4 (ref 3.4–5.3)
SODIUM: 128 — AB (ref 137–147)

## 2016-12-12 ENCOUNTER — Non-Acute Institutional Stay: Payer: Medicare Other | Admitting: Family

## 2016-12-12 DIAGNOSIS — B370 Candidal stomatitis: Secondary | ICD-10-CM

## 2016-12-12 DIAGNOSIS — B372 Candidiasis of skin and nail: Secondary | ICD-10-CM

## 2016-12-12 DIAGNOSIS — I10 Essential (primary) hypertension: Secondary | ICD-10-CM | POA: Diagnosis not present

## 2016-12-12 MED ORDER — FLUCONAZOLE 200 MG PO TABS: 200.0000 mg | ORAL_TABLET | Freq: Once | ORAL | 0 refills | Status: AC

## 2016-12-12 NOTE — Progress Notes (Signed)
Location:  [facility] Room Number: 35  Place of Service:  ALF [phone]) Provider: Kada Friesen FNP-C  Blanchie Serve, MD  Patient Care Team: Blanchie Serve, MD as PCP - General (Internal Medicine) Laporchia Nakajima, Nelda Bucks, NP as Nurse Practitioner (Family Medicine)  Extended Emergency Contact Information Primary Emergency Contact: Verhagen,[name] Address: [phone] [place]          [place], [place] [address] [name] of [place] Phone: [phone] Mobile Phone: [phone] Relation: Spouse Secondary Emergency Contact: Brunsman,[name] Address: [address]          [place], [place] [address] [name] of [place] Mobile Phone: [phone] Relation: Son  Code Status: DNR  Goals of care: Advanced Directive information Advanced Directives 11/25/2016  Does Patient Have a Medical Advance Directive? Yes  Type of Advance Directive Out of facility DNR (pink MOST or yellow form);Living will;Healthcare Power of Attorney  Does patient want to make changes to medical advance directive? -  Copy of [place] in Chart? Yes  Would patient like information on creating a medical advance directive? -  Pre-existing out of facility DNR order (yellow form or pink MOST form) Yellow form placed in chart (order not valid for inpatient use)     Chief Complaint  Patient presents with  . Acute Visit    follow up yeast infections    HPI:  Pt is a 83 y.o. female seen today at [facility] for an acute visit for evaluation follow up oral thrush and upper abdominal skin fold candidiasis. She is seen in her room today.she has completed Nystatin oral swish solution without any improvement.She has also used topical Nystatin cream to abdominal skin folds with some improvement. She denies any fever, chills or cough.   Past Medical History:  Diagnosis Date  . Acquired cyst of kidney    left  . Allergic rhinitis, cause unspecified   . Anemia, unspecified    . Chronic hyponatremia 09/05/2015  . CKD stage 2 due to type 2 diabetes mellitus ([place]) 11/18/2014  . Closed fracture of unspecified part of vertebral column without mention of spinal cord injury    T7 s/p kyphoplasty, T12  . Edema 2015  . Herpes simplex without mention of complication   . History of Epstein-Barr virus infection   . Hyposmolality and/or hyponatremia    07/13/15 Na 131  . Insomnia   . Insomnia, unspecified   . Left cavernous carotid aneurysm 08/2008   1-1/23mm aneurysm of cavernous carotid artery  . Malignant neoplasm of breast (female), unspecified site 1990   left. Had surgerey and chemo, but no radiation  . 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 Neuropathy   . Osteoarthrosis of knee    bilaterally  . Osteoarthrosis, hip   . Osteoarthrosis, unspecified whether generalized or localized, forearm    bilaterally wrist  . Other and unspecified nonspecific immunological findings    positive ANA  . Other B-complex deficiencies   . Pain in joint, site unspecified    severe, diffuse, chronic pain  . Positive ANA (antinuclear antibody)   . Pulmonary embolism ([place]) 05/07/2015  . Pure hypercholesterolemia   . Senile osteoporosis   . Unspecified essential hypertension   . Unspecified gastritis and gastroduodenitis without mention of hemorrhage   . Unspecified hypothyroidism   . Unspecified vitamin D deficiency    Past Surgical History:  Procedure Laterality Date  . DILATION AND  CURETTAGE OF UTERUS  X 2  . FEMUR IM NAIL Right 03/18/2015   Procedure: INTRAMEDULLARY (IM) NAIL FEMORAL;  Surgeon: Rod Can, MD;  Location: [facility];  Service: Orthopedics;  Laterality: Right;  . FRACTURE SURGERY    . KYPHOPLASTY  07/03/2010   T12  . KYPHOPLASTY     C7  . LAPAROSCOPIC CHOLECYSTECTOMY  09/20/2006  . MASTECTOMY Left 04/29/1989  . MIDDLE EAR SURGERY Bilateral 1938  . NASAL SINUS SURGERY  1990 & 2000  . ORIF HIP FRACTURE Left  06/13/2007   following a syncopal episode  . TONSILLECTOMY  1968    Allergies  Allergen Reactions  . Aspirin     Drop in body temp with large quantities, can tolerate low doses of aspirin  . Penicillins Swelling    Has patient had a PCN reaction causing immediate rash, facial/tongue/throat swelling, SOB or lightheadedness with hypotension: No Has patient had a PCN reaction causing severe rash involving mucus membranes or skin necrosis: No Has patient had a PCN reaction that required hospitalization No Has patient had a PCN reaction occurring within the last 10 years: No If all of the above answers are "NO", then may proceed with Cephalosporin use.    Allergies as of 12/12/2016      Reactions   Aspirin    Drop in body temp with large quantities, can tolerate low doses of aspirin   Penicillins Swelling   Has patient had a PCN reaction causing immediate rash, facial/tongue/throat swelling, SOB or lightheadedness with hypotension: No Has patient had a PCN reaction causing severe rash involving mucus membranes or skin necrosis: No Has patient had a PCN reaction that required hospitalization No Has patient had a PCN reaction occurring within the last 10 years: No If all of the above answers are "NO", then may proceed with Cephalosporin use.      Medication List       Accurate as of 12/12/16  4:23 PM. Always use your most recent med list.          acetaminophen 325 MG tablet Commonly known as:  TYLENOL Take 650 mg by mouth every 4 (four) hours as needed.   apixaban 5 MG Tabs tablet Commonly known as:  ELIQUIS Take 1 tablet (5 mg total) by mouth 2 (two) times daily.   atenolol 25 MG tablet Commonly known as:  TENORMIN Take 25 mg by mouth at bedtime.   calcium citrate 950 MG tablet Commonly known as:  CALCITRATE Take 1 tablet (200 mg of elemental calcium total) by mouth daily.   cholecalciferol 1000 units tablet Commonly known as:  VITAMIN D Take 3,000 Units by mouth  daily.   cloNIDine 0.1 MG tablet Commonly known as:  CATAPRES Take one tablet every 8 hours as needed for BP > 180/100   denosumab 60 MG/ML Soln injection Commonly known as:  PROLIA Inject 60 mg into the skin every 6 (six) months. Administer in upper arm, thigh, or abdomen   doxazosin 4 MG tablet Commonly known as:  CARDURA One daily to help control BP   feeding supplement Liqd Take 1 Container by mouth daily with supper.   fluconazole 200 MG tablet Commonly known as:  DIFLUCAN Take 1 tablet (200 mg total) by mouth once. Then take 100 mg Tablet daily by mouth x 7 days.   hydrALAZINE 50 MG tablet Commonly known as:  APRESOLINE Take 1 tablet (50 mg total) by mouth 3 (three) times daily.   irbesartan 300 MG tablet Commonly known  as:  AVAPRO Take 300 mg by mouth daily.   levothyroxine 150 MCG tablet Commonly known as:  SYNTHROID, LEVOTHROID Take 150 mcg by mouth daily before breakfast.   Lidocaine 4 % Ptch Commonly known as:  ASPERCREME LIDOCAINE Apply 3 patches topically once. Apply 1 patch to the left hip, left knee, and the right shoulder in the morning. Remove in the evening.   loratadine 10 MG tablet Commonly known as:  CLARITIN Take 10 mg by mouth daily. Reported on 09/22/2015   nystatin cream Commonly known as:  MYCOSTATIN Apply 1 application topically 2 (two) times daily. AAA to breast folds and upper abdomen   omeprazole 20 MG capsule Commonly known as:  PRILOSEC Take 1 capsule (20 mg total) by mouth daily.   oxyCODONE-acetaminophen 7.5-325 MG tablet Commonly known as:  PERCOCET Take 1 tablet by mouth 4 (four) times daily. Scheduled. Take 1 table by mouth daily as needed between 1 am and 6:30 am.   polyethylene glycol powder powder Commonly known as:  GLYCOLAX/MIRALAX Take 17 grams in 6 oz water or juice daily to prevent constipation   Psyllium 28.3 % Powd Commonly known as:  METAMUCIL SMOOTH TEXTURE One tablespoon in 6 oz wsater or juice daily to help  prevent constipation   ROBAFEN DM 10-100 MG/5ML liquid Generic drug:  Dextromethorphan-Guaifenesin Take 5 mLs by mouth every 6 (six) hours as needed.   senna-docusate 8.6-50 MG tablet Commonly known as:  SENOKOT S 2 tablets nightly to prevent constipation   traZODone 50 MG tablet Commonly known as:  DESYREL Take 50 mg by mouth at bedtime.   VITAMIN B-12 CR PO Take 1,000 mcg by mouth daily.       Review of Systems  Constitutional: Negative for activity change, appetite change, chills, fatigue and fever.  HENT: Negative for congestion, sinus pain, sinus pressure, sneezing and sore throat.   Eyes: Negative.   Respiratory: Negative for cough, chest tightness, shortness of breath and wheezing.   Cardiovascular: Positive for leg swelling. Negative for chest pain and palpitations.  Gastrointestinal: Negative for abdominal distention, abdominal pain, constipation, diarrhea, nausea and vomiting.  Skin: Negative for color change, pallor, rash and wound.       Upper abdominal fold skin redness  Psychiatric/Behavioral: Negative for agitation, confusion, hallucinations and sleep disturbance. The patient is not nervous/anxious.     Immunization History  Administered Date(s) Administered  . Influenza Split 02/13/2013  . Influenza-Unspecified 02/27/2014, 02/12/2015, 02/25/2016  . PPD Test 09/09/2015  . Pneumococcal Polysaccharide-23 02/13/2013  . Pneumococcal-Unspecified 05/05/2016  . Td 05/05/2016  . Zoster 05/04/2016   Pertinent  Health Maintenance Due  Topic Date Due  . MAMMOGRAM  09/09/2017 (Originally 03/21/1945)  . INFLUENZA VACCINE  12/14/2016  . PNA vac Low Risk Adult (2 of 2 - PCV13) 05/05/2017  . DEXA SCAN  Completed   Fall Risk  11/10/2015 10/20/2015 10/20/2015 06/30/2015 03/30/2015  Falls in the past year? No Yes No No Yes  Number falls in past yr: - 2 or more - - 2 or more  Injury with Fall? - Yes - - Yes  Risk Factor Category  - - - - High Fall Risk  Risk for fall due to  : - History of fall(s);Impaired balance/gait - - History of fall(s);Impaired balance/gait;Impaired mobility  Follow up - - - - Falls evaluation completed   Functional Status Survey:    Vitals:   12/12/16 1200  BP: (!) 158/77  Pulse: 60  Resp: 18  Temp: (!) 96.7 F (  35.9 C)  SpO2: 94%  Weight: 140 lb 12.8 oz (63.9 kg)  Height: 5\' 8"  (1.727 m)   Body mass index is 21.41 kg/m. Physical Exam  Constitutional: She is oriented to person, place, and time. She appears well-developed and well-nourished. No distress.  HENT:  Head: Normocephalic.  Right Ear: External ear normal.  Left Ear: External ear normal.  Mouth/Throat: Oropharynx is clear and moist. No oropharyngeal exudate.  Eyes: Pupils are equal, round, and reactive to light. Conjunctivae and EOM are normal. Right eye exhibits no discharge. Left eye exhibits no discharge. No scleral icterus.  Neck: Normal range of motion. No JVD present. No thyromegaly present.  Cardiovascular: Normal rate, normal heart sounds and intact distal pulses.  Exam reveals no gallop and no friction rub.   No murmur heard. Pulmonary/Chest: Effort normal and breath sounds normal. No respiratory distress. She has no wheezes. She has no rales.  Abdominal: Soft. Bowel sounds are normal. She exhibits no distension. There is no tenderness. There is no rebound and no guarding.  Lymphadenopathy:    She has no cervical adenopathy.  Neurological: She is oriented to person, place, and time. Coordination normal.  Skin: Skin is warm and dry. No rash noted. No pallor.  Whitish patch on tongue.upper abdominal folds beefy skin  Redness.   Psychiatric: She has a normal mood and affect.    Labs reviewed:  Recent Labs  11/07/16 11/14/16 12/05/16  NA 133* 133* 128*  K 4.3 4.2 4.4  BUN 25* 27* 23*  CREATININE 1.1 1.1 1.1    Recent Labs  06/23/16  AST 17  ALT 8  ALKPHOS 52    Recent Labs  06/23/16  WBC 5.6  HGB 12.3  HCT 36  PLT 159   Lab Results   Component Value Date   TSH 2.80 10/27/2016   No results found for: HGBA1C Lab Results  Component Value Date   CHOL 175 11/10/2014   HDL 53 11/10/2014   LDLCALC 87 11/10/2014   TRIG 173 (A) 11/10/2014    Significant Diagnostic Results in last 30 days:  No results found.  Assessment/Plan 1. Oropharyngeal candidiasis Whitish patches on tongue and inner buccal.Has completed Nystatin swish but plaque persist. Due to also affected areas to skin folds. Will start Fluconazole 200 mg Tablet one by mouth x 1 dose then 100 mg tablet daily by mouth x 7 days. Continue to monitor.   2. Candidiasis of skin Skin redness to upper abdominal fold has improved but not resolved. Will start fluconazole 200 mg tablet as above. Continue to monitor.   Family/ staff Communication: Reviewed plan of care with patient and facility Nurse supervisor  Labs/tests ordered: None   Sandrea Hughs, NP

## 2016-12-19 DIAGNOSIS — E876 Hypokalemia: Secondary | ICD-10-CM | POA: Diagnosis not present

## 2016-12-26 DIAGNOSIS — I10 Essential (primary) hypertension: Secondary | ICD-10-CM | POA: Diagnosis not present

## 2016-12-27 ENCOUNTER — Non-Acute Institutional Stay: Payer: Medicare Other | Admitting: Family

## 2016-12-27 ENCOUNTER — Encounter: Payer: Self-pay | Admitting: Family

## 2016-12-27 DIAGNOSIS — E871 Hypo-osmolality and hyponatremia: Secondary | ICD-10-CM

## 2016-12-27 DIAGNOSIS — G4700 Insomnia, unspecified: Secondary | ICD-10-CM | POA: Diagnosis not present

## 2016-12-27 MED ORDER — TRAZODONE HCL 50 MG PO TABS: 25.0000 mg | ORAL_TABLET | Freq: Every day | ORAL | 0 refills | Status: DC

## 2016-12-27 MED ORDER — MELATONIN 3 MG PO TABS: 3.0000 mg | ORAL_TABLET | Freq: Every day | ORAL | 0 refills | Status: DC

## 2016-12-27 NOTE — Progress Notes (Addendum)
Location:  [place] Room Number: 35 Place of Service:  ALF [phone]) Provider: Madalyn Legner FNP-C  Blanchie Serve, MD  Patient Care Team: Blanchie Serve, MD as PCP - General (Internal Medicine) Ashlynn Gunnels, Nelda Bucks, NP as Nurse Practitioner (Family Medicine)  Extended Emergency Contact Information Primary Emergency Contact: Vanderhoof,[name] Address: [phone] [place]          [place], [place] [address] [name] of [place] Phone: [phone] Mobile Phone: [phone] Relation: Spouse Secondary Emergency Contact: Ledwell,[name] Address: [address]          [place], [place] [address] [name] of [place] Mobile Phone: [phone] Relation: Son  Code Status:  DNR Goals of care: Advanced Directive information Advanced Directives 12/27/2016  Does Patient Have a Medical Advance Directive? Yes  Type of Advance Directive Out of facility DNR (pink MOST or yellow form);Living will;Healthcare Power of Attorney  Does patient want to make changes to medical advance directive? -  Copy of [place] in Chart? Yes  Would patient like information on creating a medical advance directive? -  Pre-existing out of facility DNR order (yellow form or pink MOST form) Yellow form placed in chart (order not valid for inpatient use)     Chief Complaint  Patient presents with  . Acute Visit    Abnormal labs    HPI:  Pt is a 83 y.o. female seen today at [facility] for an acute visit for evaluation of abnormal lab results. She has a significant medical history of HTN, hypothyroidism,CKD stage 2 among other conditions. She is seen in her room today. She states feeling sleepy today " I didn't sleep well last night". She states sleeps on her recliner during the night so that she can elevate her leg.Her recent lab results showed CR 1.26 previous 1.12; Cl 94 and Na 127 previous was 129, 133 (11/07/2016). She denies any Headache,dizziness, nausea,  vomiting, generalized weakness or fatigue. Also denies any fever or chills.    Past Medical History:  Diagnosis Date  . Acquired cyst of kidney    left  . Allergic rhinitis, cause unspecified   . Anemia, unspecified   . Chronic hyponatremia 09/05/2015  . CKD stage 2 due to type 2 diabetes mellitus ([place]) 11/18/2014  . Closed fracture of unspecified part of vertebral column without mention of spinal cord injury    T7 s/p kyphoplasty, T12  . Edema 2015  . Herpes simplex without mention of complication   . History of Epstein-Barr virus infection   . Hyposmolality and/or hyponatremia    07/13/15 Na 131  . Insomnia   . Insomnia, unspecified   . Left cavernous carotid aneurysm 08/2008   1-1/1mm aneurysm of cavernous carotid artery  . Malignant neoplasm of breast (female), unspecified site 1990   left. Had surgerey and chemo, but no radiation  . 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 Neuropathy   . Osteoarthrosis of knee    bilaterally  . Osteoarthrosis, hip   . Osteoarthrosis, unspecified whether generalized or localized, forearm    bilaterally wrist  . Other and unspecified nonspecific immunological findings    positive ANA  . Other B-complex deficiencies   . Pain in joint, site unspecified    severe, diffuse, chronic pain  . Positive ANA (antinuclear antibody)   . Pulmonary embolism ([place]) 05/07/2015  . Pure hypercholesterolemia   . Senile osteoporosis   . Unspecified  essential hypertension   . Unspecified gastritis and gastroduodenitis without mention of hemorrhage   . Unspecified hypothyroidism   . Unspecified vitamin D deficiency    Past Surgical History:  Procedure Laterality Date  . DILATION AND CURETTAGE OF UTERUS  X 2  . FEMUR IM NAIL Right 03/18/2015   Procedure: INTRAMEDULLARY (IM) NAIL FEMORAL;  Surgeon: Rod Can, MD;  Location: [facility]S;  Service: Orthopedics;  Laterality: Right;  . FRACTURE SURGERY    . KYPHOPLASTY   07/03/2010   T12  . KYPHOPLASTY     C7  . LAPAROSCOPIC CHOLECYSTECTOMY  09/20/2006  . MASTECTOMY Left 04/29/1989  . MIDDLE EAR SURGERY Bilateral 1938  . NASAL SINUS SURGERY  1990 & 2000  . ORIF HIP FRACTURE Left 06/13/2007   following a syncopal episode  . TONSILLECTOMY  1968    Allergies  Allergen Reactions  . Aspirin     Drop in body temp with large quantities, can tolerate low doses of aspirin  . Penicillins Swelling    Has patient had a PCN reaction causing immediate rash, facial/tongue/throat swelling, SOB or lightheadedness with hypotension: No Has patient had a PCN reaction causing severe rash involving mucus membranes or skin necrosis: No Has patient had a PCN reaction that required hospitalization No Has patient had a PCN reaction occurring within the last 10 years: No If all of the above answers are "NO", then may proceed with Cephalosporin use.    Allergies as of 12/27/2016      Reactions   Aspirin    Drop in body temp with large quantities, can tolerate low doses of aspirin   Penicillins Swelling   Has patient had a PCN reaction causing immediate rash, facial/tongue/throat swelling, SOB or lightheadedness with hypotension: No Has patient had a PCN reaction causing severe rash involving mucus membranes or skin necrosis: No Has patient had a PCN reaction that required hospitalization No Has patient had a PCN reaction occurring within the last 10 years: No If all of the above answers are "NO", then may proceed with Cephalosporin use.      Medication List       Accurate as of 12/27/16  2:44 PM. Always use your most recent med list.          acetaminophen 325 MG tablet Commonly known as:  TYLENOL Take 650 mg by mouth every 4 (four) hours as needed.   apixaban 5 MG Tabs tablet Commonly known as:  ELIQUIS Take 1 tablet (5 mg total) by mouth 2 (two) times daily.   atenolol 25 MG tablet Commonly known as:  TENORMIN Take 25 mg by mouth at bedtime.   calcium  citrate 950 MG tablet Commonly known as:  CALCITRATE Take 1 tablet (200 mg of elemental calcium total) by mouth daily.   cholecalciferol 1000 units tablet Commonly known as:  VITAMIN D Take 3,000 Units by mouth daily.   cloNIDine 0.1 MG tablet Commonly known as:  CATAPRES Take one tablet every 8 hours as needed for BP > 180/100   denosumab 60 MG/ML Soln injection Commonly known as:  PROLIA Inject 60 mg into the skin every 6 (six) months. Administer in upper arm, thigh, or abdomen   doxazosin 4 MG tablet Commonly known as:  CARDURA One daily to help control BP   feeding supplement Liqd Take 1 Container by mouth daily with supper.   hydrALAZINE 50 MG tablet Commonly known as:  APRESOLINE Take 50 mg by mouth 2 (two) times daily.   irbesartan  300 MG tablet Commonly known as:  AVAPRO Take 300 mg by mouth daily.   levothyroxine 150 MCG tablet Commonly known as:  SYNTHROID, LEVOTHROID Take 150 mcg by mouth daily before breakfast.   Lidocaine 4 % Ptch Commonly known as:  ASPERCREME LIDOCAINE Apply 3 patches topically once. Apply 1 patch to the left hip, left knee, and the right shoulder in the morning. Remove in the evening.   loratadine 10 MG tablet Commonly known as:  CLARITIN Take 10 mg by mouth daily. Reported on 09/22/2015   Melatonin 3 MG Tabs Take 1 tablet (3 mg total) by mouth at bedtime.   omeprazole 20 MG capsule Commonly known as:  PRILOSEC Take 1 capsule (20 mg total) by mouth daily.   oxyCODONE-acetaminophen 7.5-325 MG tablet Commonly known as:  PERCOCET Take 1 tablet by mouth 4 (four) times daily. Scheduled. Take 1 table by mouth daily as needed between 1 am and 6:30 am.   polyethylene glycol powder powder Commonly known as:  GLYCOLAX/MIRALAX Take 17 grams in 6 oz water or juice daily to prevent constipation   Psyllium 28.3 % Powd Commonly known as:  METAMUCIL SMOOTH TEXTURE One tablespoon in 6 oz wsater or juice daily to help prevent constipation     ROBAFEN DM 10-100 MG/5ML liquid Generic drug:  Dextromethorphan-Guaifenesin Take 5 mLs by mouth every 6 (six) hours as needed.   senna-docusate 8.6-50 MG tablet Commonly known as:  SENOKOT S 2 tablets nightly to prevent constipation   traZODone 50 MG tablet Commonly known as:  DESYREL Take 0.5 tablets (25 mg total) by mouth at bedtime. Then discontinue due to Hyponatremia.   VITAMIN B-12 CR PO Take 1,000 mcg by mouth daily.       Review of Systems  Constitutional: Negative for activity change, appetite change, chills, fatigue and fever.  HENT: Negative for congestion, sinus pain, sinus pressure, sneezing and sore throat.   Eyes: Negative.   Respiratory: Negative for cough, chest tightness, shortness of breath and wheezing.   Cardiovascular: Positive for leg swelling. Negative for chest pain and palpitations.  Gastrointestinal: Negative for abdominal distention, abdominal pain, constipation, diarrhea, nausea and vomiting.  Endocrine: Negative for cold intolerance, heat intolerance, polydipsia, polyphagia and polyuria.  Genitourinary: Negative for dysuria, flank pain, frequency and urgency.  Musculoskeletal: Positive for arthralgias and gait problem.  Skin: Negative for color change, pallor, rash and wound.  Neurological: Negative for dizziness, seizures, syncope, weakness, light-headedness and headaches.  Psychiatric/Behavioral: Positive for sleep disturbance. Negative for agitation, confusion and hallucinations. The patient is not nervous/anxious.     Immunization History  Administered Date(s) Administered  . Influenza Split 02/13/2013  . Influenza-Unspecified 02/27/2014, 02/12/2015, 02/25/2016  . PPD Test 09/09/2015  . Pneumococcal Polysaccharide-23 02/13/2013  . Pneumococcal-Unspecified 05/05/2016  . Td 05/05/2016  . Zoster 05/04/2016   Pertinent  Health Maintenance Due  Topic Date Due  . INFLUENZA VACCINE  12/14/2016  . MAMMOGRAM  09/09/2017 (Originally 03/21/1945)   . PNA vac Low Risk Adult (2 of 2 - PCV13) 05/05/2017  . DEXA SCAN  Completed   Fall Risk  11/10/2015 10/20/2015 10/20/2015 06/30/2015 03/30/2015  Falls in the past year? No Yes No No Yes  Number falls in past yr: - 2 or more - - 2 or more  Injury with Fall? - Yes - - Yes  Risk Factor Category  - - - - High Fall Risk  Risk for fall due to : - History of fall(s);Impaired balance/gait - - History of fall(s);Impaired balance/gait;Impaired mobility  Follow up - - - - Falls evaluation completed    Vitals:   12/27/16 1132  BP: (!) 188/98  Pulse: (!) 56  Resp: 20  Temp: 98.3 F (36.8 C)  SpO2: 98%  Weight: 138 lb 12.8 oz (63 kg)  Height: 5\' 8"  (1.727 m)   Body mass index is 21.1 kg/m. Physical Exam  Constitutional: She is oriented to person, place, and time. She appears well-developed and well-nourished. No distress.  HENT:  Head: Normocephalic.  Right Ear: External ear normal.  Left Ear: External ear normal.  Mouth/Throat: Oropharynx is clear and moist. No oropharyngeal exudate.  Eyes: Pupils are equal, round, and reactive to light. Conjunctivae and EOM are normal. Right eye exhibits no discharge. Left eye exhibits no discharge. No scleral icterus.  Neck: Normal range of motion. No JVD present. No thyromegaly present.  Cardiovascular: Normal rate, normal heart sounds and intact distal pulses.  Exam reveals no gallop and no friction rub.   No murmur heard. Pulmonary/Chest: Effort normal and breath sounds normal. No respiratory distress. She has no wheezes. She has no rales.  Abdominal: Soft. Bowel sounds are normal. She exhibits no distension. There is no tenderness. There is no rebound and no guarding.  Musculoskeletal: She exhibits no tenderness.  Bilateral knee ROM limited due to pain. Bilateral lower extremities 1+ edema.elevates legs while seated.   Lymphadenopathy:    She has no cervical adenopathy.  Neurological: She is oriented to person, place, and time. Coordination normal.   Skin: Skin is warm and dry. No rash noted. No pallor.  Psychiatric: She has a normal mood and affect.    Labs reviewed:  Recent Labs  11/07/16 11/14/16 12/05/16  NA 133* 133* 128*  K 4.3 4.2 4.4  BUN 25* 27* 23*  CREATININE 1.1 1.1 1.1    Recent Labs  06/23/16  AST 17  ALT 8  ALKPHOS 52    Recent Labs  06/23/16  WBC 5.6  HGB 12.3  HCT 36  PLT 159   Lab Results  Component Value Date   TSH 2.80 10/27/2016   No results found for: HGBA1C Lab Results  Component Value Date   CHOL 175 11/10/2014   HDL 53 11/10/2014   LDLCALC 87 11/10/2014   TRIG 173 (A) 11/10/2014    Significant Diagnostic Results in last 30 days:  No results found.  Assessment/Plan  Hyponatremia Asymptomatic. Medication reviewed  Trazodone could be contributing to her chronic hyponatremia. Will wean off trazodone to 1/2 Tablet (25 mg )  by mouth daily at bedtime for insomnia.   Insomnia  Wean off trazodone as above. Start melatonin 3 mg Tablet one by mouth daily at bedtime.  Family/ staff Communication: Reviewed plan of care with patient and facility Nurse supervisor.  Labs/tests ordered: None   Edin Skarda C Kiptyn Rafuse, NP

## 2016-12-29 ENCOUNTER — Encounter: Payer: Self-pay | Admitting: Family

## 2016-12-29 ENCOUNTER — Non-Acute Institutional Stay: Payer: Medicare Other | Admitting: Family

## 2016-12-29 DIAGNOSIS — B370 Candidal stomatitis: Secondary | ICD-10-CM

## 2016-12-29 DIAGNOSIS — I10 Essential (primary) hypertension: Secondary | ICD-10-CM

## 2016-12-29 MED ORDER — OXYCODONE-ACETAMINOPHEN 7.5-325 MG PO TABS: ORAL_TABLET | ORAL | 0 refills | Status: DC

## 2016-12-29 NOTE — Progress Notes (Signed)
Location:  [place] Room Number: 35 Place of Service:  ALF [id number]) Provider: Delany Steury FNP-C  Blanchie Serve, MD  Patient Care Team: Blanchie Serve, MD as PCP - General (Internal Medicine) Nicoli Nardozzi, Nelda Bucks, NP as Nurse Practitioner (Family Medicine)  Extended Emergency Contact Information Primary Emergency Contact: Lie,[name] Address: [phone] [place]          [place], [place] [address] [name] of [place] Phone: [phone] Mobile Phone: [phone] Relation: Spouse Secondary Emergency Contact: Lundeen,[name] Address: [address]          [place], [place] [address] [name] of [place] Mobile Phone: [phone] Relation: Son  Code Status: DNR  Goals of care: Advanced Directive information Advanced Directives 12/29/2016  Does Patient Have a Medical Advance Directive? Yes  Type of Advance Directive Out of facility DNR (pink MOST or yellow form);Living will;Healthcare Power of Attorney  Does patient want to make changes to medical advance directive? -  Copy of [place] in Chart? Yes  Would patient like information on creating a medical advance directive? -  Pre-existing out of facility DNR order (yellow form or pink MOST form) Yellow form placed in chart (order not valid for inpatient use)     Chief Complaint  Patient presents with  . Acute Visit    Recheck oral thrush    HPI:  Pt is a 83 y.o. female seen today at [facility] for an acute visit for follow up oral thrush.she is seen in her room today with facility Nurse supervisor at bedside. She completed diflucan course 12/19/2016 for oral thrush and abdominal skin redness. She has also used Nystatin swish and spit prior to diflucan. She states tongue feels much better but Just feel like " something is on the tongue" cannot describe feeling.she denies any fever,chills or signs of infections. She tolerates her diet well. She wears both upper and  lower dentures and soaks them in cleanser overnight. Her medications reviewed not on any inhalers or immunosuppressant. Her blood pressure log elevated this though B/p log reviewed B/p tends to be high prior to her morning medications but runs within range after taking her medications.      Past Medical History:  Diagnosis Date  . Acquired cyst of kidney    left  . Allergic rhinitis, cause unspecified   . Anemia, unspecified   . Chronic hyponatremia 09/05/2015  . CKD stage 2 due to type 2 diabetes mellitus ([place]) 11/18/2014  . Closed fracture of unspecified part of vertebral column without mention of spinal cord injury    T7 s/p kyphoplasty, T12  . Edema 2015  . Herpes simplex without mention of complication   . History of Epstein-Barr virus infection   . Hyposmolality and/or hyponatremia    07/13/15 Na 131  . Insomnia   . Insomnia, unspecified   . Left cavernous carotid aneurysm 08/2008   1-1/87mm aneurysm of cavernous carotid artery  . Malignant neoplasm of breast (female), unspecified site 1990   left. Had surgerey and chemo, but no radiation  . 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 Neuropathy   . Osteoarthrosis of knee    bilaterally  . Osteoarthrosis, hip   . Osteoarthrosis, unspecified whether generalized or localized, forearm    bilaterally wrist  . Other and unspecified nonspecific immunological findings    positive ANA  . Other B-complex deficiencies   . Pain in joint,  site unspecified    severe, diffuse, chronic pain  . Positive ANA (antinuclear antibody)   . Pulmonary embolism ([place]) 05/07/2015  . Pure hypercholesterolemia   . Senile osteoporosis   . Unspecified essential hypertension   . Unspecified gastritis and gastroduodenitis without mention of hemorrhage   . Unspecified hypothyroidism   . Unspecified vitamin D deficiency    Past Surgical History:  Procedure Laterality Date  . DILATION AND CURETTAGE OF UTERUS  X 2    . FEMUR IM NAIL Right 03/18/2015   Procedure: INTRAMEDULLARY (IM) NAIL FEMORAL;  Surgeon: Rod Can, MD;  Location: [facility];  Service: Orthopedics;  Laterality: Right;  . FRACTURE SURGERY    . KYPHOPLASTY  07/03/2010   T12  . KYPHOPLASTY     C7  . LAPAROSCOPIC CHOLECYSTECTOMY  09/20/2006  . MASTECTOMY Left 04/29/1989  . MIDDLE EAR SURGERY Bilateral 1938  . NASAL SINUS SURGERY  1990 & 2000  . ORIF HIP FRACTURE Left 06/13/2007   following a syncopal episode  . TONSILLECTOMY  1968    Allergies  Allergen Reactions  . Aspirin     Drop in body temp with large quantities, can tolerate low doses of aspirin  . Penicillins Swelling    Has patient had a PCN reaction causing immediate rash, facial/tongue/throat swelling, SOB or lightheadedness with hypotension: No Has patient had a PCN reaction causing severe rash involving mucus membranes or skin necrosis: No Has patient had a PCN reaction that required hospitalization No Has patient had a PCN reaction occurring within the last 10 years: No If all of the above answers are "NO", then may proceed with Cephalosporin use.    Allergies as of 12/29/2016      Reactions   Aspirin    Drop in body temp with large quantities, can tolerate low doses of aspirin   Penicillins Swelling   Has patient had a PCN reaction causing immediate rash, facial/tongue/throat swelling, SOB or lightheadedness with hypotension: No Has patient had a PCN reaction causing severe rash involving mucus membranes or skin necrosis: No Has patient had a PCN reaction that required hospitalization No Has patient had a PCN reaction occurring within the last 10 years: No If all of the above answers are "NO", then may proceed with Cephalosporin use.      Medication List       Accurate as of 12/29/16 12:02 PM. Always use your most recent med list.          acetaminophen 325 MG tablet Commonly known as:  TYLENOL Take 650 mg by mouth every 4 (four) hours as needed.    apixaban 5 MG Tabs tablet Commonly known as:  ELIQUIS Take 1 tablet (5 mg total) by mouth 2 (two) times daily.   atenolol 25 MG tablet Commonly known as:  TENORMIN Take 25 mg by mouth at bedtime.   calcium citrate 950 MG tablet Commonly known as:  CALCITRATE Take 1 tablet (200 mg of elemental calcium total) by mouth daily.   cholecalciferol 1000 units tablet Commonly known as:  VITAMIN D Take 3,000 Units by mouth daily.   cloNIDine 0.1 MG tablet Commonly known as:  CATAPRES Take one tablet every 8 hours as needed for BP > 180/100   denosumab 60 MG/ML Soln injection Commonly known as:  PROLIA Inject 60 mg into the skin every 6 (six) months. Administer in upper arm, thigh, or abdomen   doxazosin 4 MG tablet Commonly known as:  CARDURA One daily to help control BP  feeding supplement Liqd Take 1 Container by mouth daily with supper.   hydrALAZINE 50 MG tablet Commonly known as:  APRESOLINE Take 50 mg by mouth 2 (two) times daily.   irbesartan 300 MG tablet Commonly known as:  AVAPRO Take 300 mg by mouth daily.   levothyroxine 150 MCG tablet Commonly known as:  SYNTHROID, LEVOTHROID Take 150 mcg by mouth daily before breakfast.   Lidocaine 4 % Ptch Commonly known as:  ASPERCREME LIDOCAINE Apply 3 patches topically once. Apply 1 patch to the left hip, left knee, and the right shoulder in the morning. Remove in the evening.   loratadine 10 MG tablet Commonly known as:  CLARITIN Take 10 mg by mouth daily. Reported on 09/22/2015   Melatonin 3 MG Tabs Take 1 tablet (3 mg total) by mouth at bedtime.   omeprazole 20 MG capsule Commonly known as:  PRILOSEC Take 1 capsule (20 mg total) by mouth daily.   oxyCODONE-acetaminophen 7.5-325 MG tablet Commonly known as:  PERCOCET Take 1 tablet by mouth 4 (four) times daily. Four times daily scheduled and one at midnight as needed.   polyethylene glycol powder powder Commonly known as:  GLYCOLAX/MIRALAX Take 17 grams in 6  oz water or juice daily to prevent constipation   Psyllium 28.3 % Powd Commonly known as:  METAMUCIL SMOOTH TEXTURE One tablespoon in 6 oz wsater or juice daily to help prevent constipation   ROBAFEN DM 10-100 MG/5ML liquid Generic drug:  Dextromethorphan-Guaifenesin Take 5 mLs by mouth every 6 (six) hours as needed.   senna-docusate 8.6-50 MG tablet Commonly known as:  SENOKOT S 2 tablets nightly to prevent constipation   traZODone 50 MG tablet Commonly known as:  DESYREL Take 0.5 tablets (25 mg total) by mouth at bedtime. Then discontinue due to Hyponatremia.   VITAMIN B-12 CR PO Take 1,000 mcg by mouth daily.       Review of Systems  Constitutional: Negative for activity change, appetite change, chills, fatigue and fever.  HENT: Negative for congestion, rhinorrhea, sinus pain, sinus pressure, sneezing and sore throat.        Wears upper and lower dentures  Respiratory: Negative for cough, chest tightness, shortness of breath and wheezing.   Cardiovascular: Positive for leg swelling. Negative for chest pain and palpitations.  Gastrointestinal: Negative for abdominal distention, abdominal pain, constipation, diarrhea, nausea and vomiting.  Endocrine: Negative for polydipsia, polyphagia and polyuria.  Skin: Negative for color change, pallor, rash and wound.       Whitish patch on tongue per HPI   Psychiatric/Behavioral: Positive for sleep disturbance. Negative for agitation, confusion and hallucinations. The patient is not nervous/anxious.     Immunization History  Administered Date(s) Administered  . Influenza Split 02/13/2013  . Influenza-Unspecified 02/27/2014, 02/12/2015, 02/25/2016  . PPD Test 09/09/2015  . Pneumococcal Polysaccharide-23 02/13/2013  . Pneumococcal-Unspecified 05/05/2016  . Td 05/05/2016  . Zoster 05/04/2016   Pertinent  Health Maintenance Due  Topic Date Due  . INFLUENZA VACCINE  12/14/2016  . MAMMOGRAM  09/09/2017 (Originally 03/21/1945)  . PNA  vac Low Risk Adult (2 of 2 - PCV13) 05/05/2017  . DEXA SCAN  Completed   Fall Risk  11/10/2015 10/20/2015 10/20/2015 06/30/2015 03/30/2015  Falls in the past year? No Yes No No Yes  Number falls in past yr: - 2 or more - - 2 or more  Injury with Fall? - Yes - - Yes  Risk Factor Category  - - - - High Fall Risk  Risk for  fall due to : - History of fall(s);Impaired balance/gait - - History of fall(s);Impaired balance/gait;Impaired mobility  Follow up - - - - Falls evaluation completed    Vitals:   12/29/16 0948  BP: (!) 168/92  Pulse: (!) 58  Resp: 14  Temp: 98.9 F (37.2 C)  SpO2: 94%  Weight: 138 lb 12.8 oz (63 kg)  Height: 5\' 8"  (1.727 m)   Body mass index is 21.1 kg/m. Physical Exam  Constitutional: She is oriented to person, place, and time. She appears well-developed and well-nourished. No distress.  HENT:  Head: Normocephalic.  Mouth/Throat: No oropharyngeal exudate.  Whitish patch on tongue has improved now concentrated to the middle of the tongue unable to scrape off.   Eyes: Pupils are equal, round, and reactive to light. Conjunctivae and EOM are normal. Right eye exhibits no discharge. Left eye exhibits no discharge. No scleral icterus.  Neck: Normal range of motion. No JVD present. No thyromegaly present.  Cardiovascular: Normal rate, normal heart sounds and intact distal pulses.  Exam reveals no gallop and no friction rub.   No murmur heard. Pulmonary/Chest: Effort normal and breath sounds normal. No respiratory distress. She has no wheezes. She has no rales.  Abdominal: Soft. Bowel sounds are normal. She exhibits no distension. There is no tenderness. There is no rebound and no guarding.  Lymphadenopathy:    She has no cervical adenopathy.  Neurological: She is oriented to person, place, and time. Coordination normal.  Skin: Skin is warm and dry. No rash noted. No pallor.  Psychiatric: She has a normal mood and affect.    Labs reviewed:  Recent Labs  11/07/16  11/14/16 12/05/16  NA 133* 133* 128*  K 4.3 4.2 4.4  BUN 25* 27* 23*  CREATININE 1.1 1.1 1.1    Recent Labs  06/23/16  AST 17  ALT 8  ALKPHOS 52    Recent Labs  06/23/16  WBC 5.6  HGB 12.3  HCT 36  PLT 159   Lab Results  Component Value Date   TSH 2.80 10/27/2016   Lab Results  Component Value Date   CHOL 175 11/10/2014   HDL 53 11/10/2014   LDLCALC 87 11/10/2014   TRIG 173 (A) 11/10/2014    Significant Diagnostic Results in last 30 days:  No results found.  Assessment/Plan 1. Oral thrush Whitish patches has improved now concentrated on the middle of the tongue unable to scrape off this visit.she tolerates her diet well and currently not on any inhalers or immunosuppressant.No signs or symptoms of infections.She wears both upper and lower dentures which she soaks them in cleanser overnight this could be contributing to her oral thrush. Will encourage her to maintain oral hygiene with every meal. Start Nystatin 100,000 units swish and spit 5 ml four times daily X 14 days. Avoid eating or drinking x 30 minutes after using Nystatin.  Continue to monitor.   2. Essential hypertension Her blood pressure elevated this visit though B/p log reviewed B/p tends to be high prior to her morning medications but runs within range after taking her medications.will continue on current medication and monitor.   Family/ staff Communication: Reviewed plan of care with patient and facility Nurse supervisor  Labs/tests ordered: None   Sandrea Hughs, NP

## 2017-01-02 ENCOUNTER — Non-Acute Institutional Stay: Payer: Medicare Other

## 2017-01-02 DIAGNOSIS — Z0000 Encounter for general adult medical examination without abnormal findings: Secondary | ICD-10-CM

## 2017-01-02 DIAGNOSIS — E871 Hypo-osmolality and hyponatremia: Secondary | ICD-10-CM | POA: Diagnosis not present

## 2017-01-02 LAB — BASIC METABOLIC PANEL
BUN: 28 — AB (ref 4–21)
CREATININE: 1.2 — AB (ref 0.5–1.1)
Glucose: 86
Potassium: 4.2 (ref 3.4–5.3)
Sodium: 127 — AB (ref 137–147)

## 2017-01-02 LAB — HEPATIC FUNCTION PANEL
ALK PHOS: 45 (ref 25–125)
ALT: 10 (ref 7–35)
AST: 14 (ref 13–35)
Bilirubin, Total: 0.6

## 2017-01-02 NOTE — Progress Notes (Signed)
Subjective:   Bailey [name] is a 83 y.o. female who presents for an Initial Medicare Annual Wellness Visit at [facility]    Objective:    Today's Vitals   01/02/17 1307 01/02/17 1310  BP: (!) 120/51   Pulse: (!) 51   Temp: (!) 97.3 F (36.3 C)   TempSrc: Oral   SpO2: 98%   Weight: 139 lb (63 kg)   Height: 5\' 8"  (1.727 m)   PainSc:  4    Body mass index is 21.13 kg/m.   Current Medications (verified) Outpatient Encounter Prescriptions as of 01/02/2017  Medication Sig  . acetaminophen (TYLENOL) 325 MG tablet Take 650 mg by mouth every 4 (four) hours as needed.  Marland Kitchen apixaban (ELIQUIS) 2.5 MG TABS tablet Take 2.5 mg by mouth 2 (two) times daily.  Marland Kitchen atenolol (TENORMIN) 25 MG tablet Take 25 mg by mouth at bedtime.   . calcium citrate (CALCITRATE) 950 MG tablet Take 1 tablet (200 mg of elemental calcium total) by mouth daily.  . cholecalciferol (VITAMIN D) 1000 UNITS tablet Take 3,000 Units by mouth daily.  . cloNIDine (CATAPRES) 0.1 MG tablet Take one tablet every 8 hours as needed for BP > 180/100  . Cyanocobalamin (VITAMIN B-12 CR PO) Take 1,000 mcg by mouth daily.   Marland Kitchen denosumab (PROLIA) 60 MG/ML SOLN injection Inject 60 mg into the skin every 6 (six) months. Administer in upper arm, thigh, or abdomen  . doxazosin (CARDURA) 4 MG tablet One daily to help control BP  . feeding supplement (BOOST / RESOURCE BREEZE) LIQD Take 1 Container by mouth daily with supper.   . hydrALAZINE (APRESOLINE) 50 MG tablet Take 50 mg by mouth 2 (two) times daily.  . irbesartan (AVAPRO) 300 MG tablet Take 300 mg by mouth daily.  Marland Kitchen levothyroxine (SYNTHROID, LEVOTHROID) 150 MCG tablet Take 150 mcg by mouth daily before breakfast.  . Lidocaine (ASPERCREME LIDOCAINE) 4 % PTCH Apply 3 patches topically once. Apply 1 patch to the left hip, left knee, and the right shoulder in the morning. Remove in the evening.  . loratadine (CLARITIN) 10 MG tablet Take 10 mg by mouth daily. Reported on  09/22/2015  . Melatonin 3 MG TABS Take 1 tablet (3 mg total) by mouth at bedtime.  Marland Kitchen nystatin (MYCOSTATIN) 100000 UNIT/ML suspension Take 5 mLs by mouth 4 (four) times daily.  Marland Kitchen omeprazole (PRILOSEC) 20 MG capsule Take 1 capsule (20 mg total) by mouth daily.  Marland Kitchen oxyCODONE-acetaminophen (PERCOCET) 7.5-325 MG tablet Take 1 tablet by mouth 4 (four) times daily. Four times daily scheduled and one at midnight as needed.  . polyethylene glycol powder (GLYCOLAX/MIRALAX) powder Take 17 grams in 6 oz water or juice daily to prevent constipation  . Psyllium (METAMUCIL SMOOTH TEXTURE) 28.3 % POWD One tablespoon in 6 oz wsater or juice daily to help prevent constipation  . senna-docusate (SENOKOT S) 8.6-50 MG tablet 2 tablets nightly to prevent constipation  . Dextromethorphan-Guaifenesin (ROBAFEN DM) 10-100 MG/5ML liquid Take 5 mLs by mouth every 6 (six) hours as needed.  . [DISCONTINUED] apixaban (ELIQUIS) 5 MG TABS tablet Take 1 tablet (5 mg total) by mouth 2 (two) times daily.  . [DISCONTINUED] traZODone (DESYREL) 50 MG tablet Take 0.5 tablets (25 mg total) by mouth at bedtime. Then discontinue due to Hyponatremia.   No facility-administered encounter medications on file as of 01/02/2017.     Allergies (verified) Aspirin and Penicillins   History: Past Medical History:  Diagnosis Date  .  Acquired cyst of kidney    left  . Allergic rhinitis, cause unspecified   . Anemia, unspecified   . Chronic hyponatremia 09/05/2015  . CKD stage 2 due to type 2 diabetes mellitus ([place]) 11/18/2014  . Closed fracture of unspecified part of vertebral column without mention of spinal cord injury    T7 s/p kyphoplasty, T12  . Edema 2015  . Herpes simplex without mention of complication   . History of Epstein-Barr virus infection   . Hyposmolality and/or hyponatremia    07/13/15 Na 131  . Insomnia   . Insomnia, unspecified   . Left cavernous carotid aneurysm 08/2008   1-1/69mm aneurysm of cavernous carotid artery  .  Malignant neoplasm of breast (female), unspecified site 1990   left. Had surgerey and chemo, but no radiation  . 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 Neuropathy   . Osteoarthrosis of knee    bilaterally  . Osteoarthrosis, hip   . Osteoarthrosis, unspecified whether generalized or localized, forearm    bilaterally wrist  . Other and unspecified nonspecific immunological findings    positive ANA  . Other B-complex deficiencies   . Pain in joint, site unspecified    severe, diffuse, chronic pain  . Positive ANA (antinuclear antibody)   . Pulmonary embolism ([place]) 05/07/2015  . Pure hypercholesterolemia   . Senile osteoporosis   . Unspecified essential hypertension   . Unspecified gastritis and gastroduodenitis without mention of hemorrhage   . Unspecified hypothyroidism   . Unspecified vitamin D deficiency    Past Surgical History:  Procedure Laterality Date  . DILATION AND CURETTAGE OF UTERUS  X 2  . FEMUR IM NAIL Right 03/18/2015   Procedure: INTRAMEDULLARY (IM) NAIL FEMORAL;  Surgeon: Rod Can, MD;  Location: [facility];  Service: Orthopedics;  Laterality: Right;  . FRACTURE SURGERY    . KYPHOPLASTY  07/03/2010   T12  . KYPHOPLASTY     C7  . LAPAROSCOPIC CHOLECYSTECTOMY  09/20/2006  . MASTECTOMY Left 04/29/1989  . MIDDLE EAR SURGERY Bilateral 1938  . NASAL SINUS SURGERY  1990 & 2000  . ORIF HIP FRACTURE Left 06/13/2007   following a syncopal episode  . TONSILLECTOMY  1968   No family history on file. Social History   Occupational History  . retired Marine scientist    Social History Main Topics  . Smoking status: Never Smoker  . Smokeless tobacco: Never Used  . Alcohol use Yes     Comment: 05/08/2015 "I'll have a glass of white wine q now and then"  . Drug use: No  . Sexual activity: No    Tobacco Counseling Counseling given: Not Answered   Activities of Daily Living In your present state of health, do you have any  difficulty performing the following activities: 01/02/2017  Hearing? Y  Vision? N  Difficulty concentrating or making decisions? N  Walking or climbing stairs? Y  Dressing or bathing? Y  Doing errands, shopping? Y  Preparing Food and eating ? Y  Using the Toilet? Y  In the past six months, have you accidently leaked urine? Y  Comment wears pads  Do you have problems with loss of bowel control? N  Managing your Medications? Y  Managing your Finances? Y  Housekeeping or managing your Housekeeping? Y  Some recent data might be hidden    Immunizations and Health Maintenance Immunization History  Administered Date(s) Administered  . Influenza Split 02/13/2013  . Influenza-Unspecified 02/27/2014,  02/12/2015, 02/25/2016  . PPD Test 09/09/2015  . Pneumococcal Polysaccharide-23 02/13/2013  . Pneumococcal-Unspecified 05/05/2016  . Td 05/05/2016  . Zoster 05/04/2016   Health Maintenance Due  Topic Date Due  . INFLUENZA VACCINE  12/14/2016    Patient Care Team: Blanchie Serve, MD as PCP - General (Internal Medicine) N[place], Nelda Bucks, NP as Nurse Practitioner (Family Medicine)  Indicate any recent Medical Services you may have received from other than [facility] providers in the past year (date may be approximate).     Assessment:   This is a routine wellness examination for [place].   Hearing/Vision screen No exam data present  Dietary issues and exercise activities discussed: Current Exercise Habits: Home exercise routine, Type of exercise: walking, Time (Minutes): 15, Frequency (Times/Week): 6, Weekly Exercise (Minutes/Week): 90, Intensity: Mild, Exercise limited by: orthopedic condition(s)  Goals    None     Depression Screen PHQ 2/9 Scores 01/02/2017 10/20/2015 03/30/2015 11/18/2014 07/22/2014 10/22/2013  PHQ - 2 Score 0 0 0 0 1 1    Fall Risk Fall Risk  01/02/2017 11/10/2015 10/20/2015 10/20/2015 06/30/2015  Falls in the past year? No No Yes No No  Number falls in past yr: - - 2 or more -  -  Injury with Fall? - - Yes - -  Risk Factor Category  - - - - -  Risk for fall due to : - - History of fall(s);Impaired balance/gait - -  Follow up - - - - -    Cognitive Function: MMSE - Mini Mental State Exam 01/02/2017  Orientation to time 4  Orientation to Place 5  Registration 3  Attention/ Calculation 5  Recall 3  Language- name 2 objects 2  Language- repeat 1  Language- follow 3 step command 3  Language- read & follow direction 1  Write a sentence 1  Copy design 1  Total score 29        Screening Tests Health Maintenance  Topic Date Due  . INFLUENZA VACCINE  12/14/2016  . MAMMOGRAM  09/09/2017 (Originally 03/21/1945)  . PNA vac Low Risk Adult (2 of 2 - PCV13) 05/05/2017  . TETANUS/TDAP  05/05/2026  . DEXA SCAN  Completed      Plan:    I have personally reviewed and addressed the Medicare Annual Wellness questionnaire and have noted the following in the patient's chart:  A. Medical and social history B. Use of alcohol, tobacco or illicit drugs  C. Current medications and supplements D. Functional ability and status E.  Nutritional status F.  Physical activity G. Advance directives H. List of other physicians I.  Hospitalizations, surgeries, and ER visits in previous 12 months J.  [place] to include hearing, vision, cognitive, depression L. Referrals and appointments - none  In addition, I have reviewed and discussed with patient certain preventive protocols, quality metrics, and best practice recommendations. A written personalized care plan for preventive services as well as general preventive health recommendations were provided to patient.  See attached scanned questionnaire for additional information.   Signed,   Rich Reining, RN Nurse Health Advisor   Quick Notes   Health Maintenance: Up to date     Abnormal Screen: MMSE 29/30. Passed clock drawing     Patient Concerns: None     Nurse Concerns: None

## 2017-01-02 NOTE — Patient Instructions (Signed)
[name] , Thank you for taking time to come for your Medicare Wellness Visit. I appreciate your ongoing commitment to your health goals. Please review the following plan we discussed and let me know if I can assist you in the future.   Screening recommendations/referrals: Colonoscopy excluded pt over age 81 Mammogram excluded, pt over age 55 Bone Density up to date Recommended yearly ophthalmology/optometry visit for glaucoma screening and checkup Recommended yearly dental visit for hygiene and checkup  Vaccinations: Influenza vaccine due 2018 fall season Pneumococcal vaccine up to date Tdap vaccine up to date. Due 05/[birth year]/1926 Shingles vaccine not in records  Advanced directives: DNR and Health care power of attorney in chart. Need living will for chart  Conditions/risks identified: None  Next appointment: Dr. Bubba Camp makes rounds   Preventive Care 65 Years and Older, Female Preventive care refers to lifestyle choices and visits with your health care provider that can promote health and wellness. What does preventive care include?  A yearly physical exam. This is also called an annual well check.  Dental exams once or twice a year.  Routine eye exams. Ask your health care provider how often you should have your eyes checked.  Personal lifestyle choices, including:  Daily care of your teeth and gums.  Regular physical activity.  Eating a healthy diet.  Avoiding tobacco and drug use.  Limiting alcohol use.  Practicing safe sex.  Taking low-dose aspirin every day.  Taking vitamin and mineral supplements as recommended by your health care provider. What happens during an annual well check? The services and screenings done by your health care provider during your annual well check will depend on your age, overall health, lifestyle risk factors, and family history of disease. Counseling  Your health care provider may ask you questions about your:  Alcohol  use.  Tobacco use.  Drug use.  Emotional well-being.  Home and relationship well-being.  Sexual activity.  Eating habits.  History of falls.  Memory and ability to understand (cognition).  Work and work Statistician.  Reproductive health. Screening  You may have the following tests or measurements:  Height, weight, and BMI.  Blood pressure.  Lipid and cholesterol levels. These may be checked every 5 years, or more frequently if you are over 40 years old.  Skin check.  Lung cancer screening. You may have this screening every year starting at age 33 if you have a 30-pack-year history of smoking and currently smoke or have quit within the past 15 years.  Fecal occult blood test (FOBT) of the stool. You may have this test every year starting at age 42.  Flexible sigmoidoscopy or colonoscopy. You may have a sigmoidoscopy every 5 years or a colonoscopy every 10 years starting at age 76.  Hepatitis C blood test.  Hepatitis B blood test.  Sexually transmitted disease (STD) testing.  Diabetes screening. This is done by checking your blood sugar (glucose) after you have not eaten for a while (fasting). You may have this done every 1-3 years.  Bone density scan. This is done to screen for osteoporosis. You may have this done starting at age 58.  Mammogram. This may be done every 1-2 years. Talk to your health care provider about how often you should have regular mammograms. Talk with your health care provider about your test results, treatment options, and if necessary, the need for more tests. Vaccines  Your health care provider may recommend certain vaccines, such as:  Influenza vaccine. This is recommended every year.  Tetanus, diphtheria, and acellular pertussis (Tdap, Td) vaccine. You may need a Td booster every 10 years.  Zoster vaccine. You may need this after age 11.  Pneumococcal 13-valent conjugate (PCV13) vaccine. One dose is recommended after age  6.  Pneumococcal polysaccharide (PPSV23) vaccine. One dose is recommended after age 69. Talk to your health care provider about which screenings and vaccines you need and how often you need them. This information is not intended to replace advice given to you by your health care provider. Make sure you discuss any questions you have with your health care provider. Document Released: 05/29/2015 Document Revised: 01/20/2016 Document Reviewed: 03/03/2015 Elsevier Interactive Patient Education  2017 [place] Prevention in the Home Falls can cause injuries. They can happen to people of all ages. There are many things you can do to make your home safe and to help prevent falls. What can I do on the outside of my home?  Regularly fix the edges of walkways and driveways and fix any cracks.  Remove anything that might make you trip as you walk through a door, such as a raised step or threshold.  Trim any bushes or trees on the path to your home.  Use bright outdoor lighting.  Clear any walking paths of anything that might make someone trip, such as rocks or tools.  Regularly check to see if handrails are loose or broken. Make sure that both sides of any steps have handrails.  Any raised decks and porches should have guardrails on the edges.  Have any leaves, snow, or ice cleared regularly.  Use sand or salt on walking paths during winter.  Clean up any spills in your garage right away. This includes oil or grease spills. What can I do in the bathroom?  Use night lights.  Install grab bars by the toilet and in the tub and shower. Do not use towel bars as grab bars.  Use non-skid mats or decals in the tub or shower.  If you need to sit down in the shower, use a plastic, non-slip stool.  Keep the floor dry. Clean up any water that spills on the floor as soon as it happens.  Remove soap buildup in the tub or shower regularly.  Attach bath mats securely with double-sided  non-slip rug tape.  Do not have throw rugs and other things on the floor that can make you trip. What can I do in the bedroom?  Use night lights.  Make sure that you have a light by your bed that is easy to reach.  Do not use any sheets or blankets that are too big for your bed. They should not hang down onto the floor.  Have a firm chair that has side arms. You can use this for support while you get dressed.  Do not have throw rugs and other things on the floor that can make you trip. What can I do in the kitchen?  Clean up any spills right away.  Avoid walking on wet floors.  Keep items that you use a lot in easy-to-reach places.  If you need to reach something above you, use a strong step stool that has a grab bar.  Keep electrical cords out of the way.  Do not use floor polish or wax that makes floors slippery. If you must use wax, use non-skid floor wax.  Do not have throw rugs and other things on the floor that can make you trip. What can I do  with my stairs?  Do not leave any items on the stairs.  Make sure that there are handrails on both sides of the stairs and use them. Fix handrails that are broken or loose. Make sure that handrails are as long as the stairways.  Check any carpeting to make sure that it is firmly attached to the stairs. Fix any carpet that is loose or worn.  Avoid having throw rugs at the top or bottom of the stairs. If you do have throw rugs, attach them to the floor with carpet tape.  Make sure that you have a light switch at the top of the stairs and the bottom of the stairs. If you do not have them, ask someone to add them for you. What else can I do to help prevent falls?  Wear shoes that:  Do not have high heels.  Have rubber bottoms.  Are comfortable and fit you well.  Are closed at the toe. Do not wear sandals.  If you use a stepladder:  Make sure that it is fully opened. Do not climb a closed stepladder.  Make sure that both  sides of the stepladder are locked into place.  Ask someone to hold it for you, if possible.  Clearly mark and make sure that you can see:  Any grab bars or handrails.  First and last steps.  Where the edge of each step is.  Use tools that help you move around (mobility aids) if they are needed. These include:  Canes.  Walkers.  Scooters.  Crutches.  Turn on the lights when you go into a dark area. Replace any light bulbs as soon as they burn out.  Set up your furniture so you have a clear path. Avoid moving your furniture around.  If any of your floors are uneven, fix them.  If there are any pets around you, be aware of where they are.  Review your medicines with your doctor. Some medicines can make you feel dizzy. This can increase your chance of falling. Ask your doctor what other things that you can do to help prevent falls. This information is not intended to replace advice given to you by your health care provider. Make sure you discuss any questions you have with your health care provider. Document Released: 02/26/2009 Document Revised: 10/08/2015 Document Reviewed: 06/06/2014 Elsevier Interactive Patient Education  2017 [employer].

## 2017-01-03 ENCOUNTER — Encounter: Payer: Self-pay | Admitting: *Deleted

## 2017-01-09 DIAGNOSIS — E871 Hypo-osmolality and hyponatremia: Secondary | ICD-10-CM | POA: Diagnosis not present

## 2017-01-09 LAB — BASIC METABOLIC PANEL
BUN: 26 — AB (ref 4–21)
CREATININE: 1 (ref 0.5–1.1)
GLUCOSE: 90
POTASSIUM: 4.1 (ref 3.4–5.3)
Sodium: 129 — AB (ref 137–147)

## 2017-01-10 ENCOUNTER — Non-Acute Institutional Stay: Payer: Medicare Other | Admitting: Family

## 2017-01-10 ENCOUNTER — Encounter: Payer: Self-pay | Admitting: *Deleted

## 2017-01-10 ENCOUNTER — Encounter: Payer: Self-pay | Admitting: Family

## 2017-01-10 DIAGNOSIS — E871 Hypo-osmolality and hyponatremia: Secondary | ICD-10-CM | POA: Diagnosis not present

## 2017-01-10 DIAGNOSIS — I10 Essential (primary) hypertension: Secondary | ICD-10-CM | POA: Diagnosis not present

## 2017-01-10 NOTE — Progress Notes (Signed)
Location:  [facility] Room Number: 35 Place of Service:  [phone]) Provider: Dinah Ngetich FNP-C  Blanchie Serve, MD  Patient Care Team: Blanchie Serve, MD as PCP - General (Internal Medicine) Ngetich, Nelda Bucks, NP as Nurse Practitioner (Family Medicine)  Extended Emergency Contact Information Primary Emergency Contact: Casas,[name] Address: [phone] [place]          [place], [place] [address] [name] of [place] Phone: [phone] Mobile Phone: [phone] Relation: Spouse Secondary Emergency Contact: Riden,[name] Address: [address]          [place], [place] [address] [name] of [place] Mobile Phone: [phone] Relation: Son  Code Status: DNR Goals of care: Advanced Directive information Advanced Directives 01/10/2017  Does Patient Have a Medical Advance Directive? Yes  Type of Advance Directive Out of facility DNR (pink MOST or yellow form);Living will;Healthcare Power of Attorney  Does patient want to make changes to medical advance directive? -  Copy of [place] in Chart? Yes  Would patient like information on creating a medical advance directive? -  Pre-existing out of facility DNR order (yellow form or pink MOST form) Yellow form placed in chart (order not valid for inpatient use)     Chief Complaint  Patient presents with  . Acute Visit    abnormal labs    HPI:  Pt is a 83 y.o. female seen today at [facility] for an acute visit for evaluation of abnormal lab results. She has a significant medical history of HTN,hypothyroidism, CKD stage 2 among other conditions. She is seen in her room today. She denies any acute issues. Her recent lab results showed Na+129,Previous Na + 133 (11/14/16); 128 (12/05/2016);127 ( 01/02/2017).  CL 97,Ca 8.4, BUN 26, CR 1.03 ( 01/09/2017 ). Facility blood pressure reviewed B/P readings ranging in the 110's/70's-150's/80's with sporadic SBP in 160's-170's. She  denies any headache,dizziness, nausea, vomiting, or chest pain.    Past Medical History:  Diagnosis Date  . Acquired cyst of kidney    left  . Allergic rhinitis, cause unspecified   . Anemia, unspecified   . Chronic hyponatremia 09/05/2015  . CKD stage 2 due to type 2 diabetes mellitus ([place]) 11/18/2014  . Closed fracture of unspecified part of vertebral column without mention of spinal cord injury    T7 s/p kyphoplasty, T12  . Edema 2015  . Herpes simplex without mention of complication   . History of Epstein-Barr virus infection   . Hyposmolality and/or hyponatremia    07/13/15 Na 131  . Insomnia   . Insomnia, unspecified   . 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 Malignant neoplasm of breast (female), unspecified site 1990   left. Had surgerey and chemo, but no radiation  . 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 Neuropathy   . Osteoarthrosis of knee    bilaterally  . Osteoarthrosis, hip   . Osteoarthrosis, unspecified whether generalized or localized, forearm    bilaterally wrist  . Other and unspecified nonspecific immunological findings    positive ANA  . Other B-complex deficiencies   . Pain in joint, site unspecified    severe, diffuse, chronic pain  . Positive ANA (antinuclear antibody)   . Pulmonary embolism ([place]) 05/07/2015  . Pure hypercholesterolemia   . Senile osteoporosis   . Unspecified essential hypertension   . Unspecified gastritis and gastroduodenitis  without mention of hemorrhage   . Unspecified hypothyroidism   . Unspecified vitamin D deficiency    Past Surgical History:  Procedure Laterality Date  . DILATION AND CURETTAGE OF UTERUS  X 2  . FEMUR IM NAIL Right 03/18/2015   Procedure: INTRAMEDULLARY (IM) NAIL FEMORAL;  Surgeon: Rod Can, MD;  Location: [facility]S;  Service: Orthopedics;  Laterality: Right;  . FRACTURE SURGERY    . KYPHOPLASTY  07/03/2010    T12  . KYPHOPLASTY     C7  . LAPAROSCOPIC CHOLECYSTECTOMY  09/20/2006  . MASTECTOMY Left 04/29/1989  . MIDDLE EAR SURGERY Bilateral 1938  . NASAL SINUS SURGERY  1990 & 2000  . ORIF HIP FRACTURE Left 06/13/2007   following a syncopal episode  . TONSILLECTOMY  1968    Allergies  Allergen Reactions  . Aspirin     Drop in body temp with large quantities, can tolerate low doses of aspirin  . Penicillins Swelling    Has patient had a PCN reaction causing immediate rash, facial/tongue/throat swelling, SOB or lightheadedness with hypotension: No Has patient had a PCN reaction causing severe rash involving mucus membranes or skin necrosis: No Has patient had a PCN reaction that required hospitalization No Has patient had a PCN reaction occurring within the last 10 years: No If all of the above answers are "NO", then may proceed with Cephalosporin use.    Allergies as of 01/10/2017      Reactions   Aspirin    Drop in body temp with large quantities, can tolerate low doses of aspirin   Penicillins Swelling   Has patient had a PCN reaction causing immediate rash, facial/tongue/throat swelling, SOB or lightheadedness with hypotension: No Has patient had a PCN reaction causing severe rash involving mucus membranes or skin necrosis: No Has patient had a PCN reaction that required hospitalization No Has patient had a PCN reaction occurring within the last 10 years: No If all of the above answers are "NO", then may proceed with Cephalosporin use.      Medication List       Accurate as of 01/10/17  1:26 PM. Always use your most recent med list.          acetaminophen 325 MG tablet Commonly known as:  TYLENOL Take 650 mg by mouth every 4 (four) hours as needed.   atenolol 25 MG tablet Commonly known as:  TENORMIN Take 25 mg by mouth at bedtime.   calcium citrate 950 MG tablet Commonly known as:  CALCITRATE Take 1 tablet (200 mg of elemental calcium total) by mouth daily.     cholecalciferol 1000 units tablet Commonly known as:  VITAMIN D Take 3,000 Units by mouth daily.   cloNIDine 0.1 MG tablet Commonly known as:  CATAPRES Take one tablet every 8 hours as needed for BP > 180/100   denosumab 60 MG/ML Soln injection Commonly known as:  PROLIA Inject 60 mg into the skin every 6 (six) months. Administer in upper arm, thigh, or abdomen   doxazosin 4 MG tablet Commonly known as:  CARDURA One daily to help control BP   ELIQUIS 2.5 MG Tabs tablet Generic drug:  apixaban Take 2.5 mg by mouth 2 (two) times daily.   feeding supplement Liqd Take 1 Container by mouth daily with supper.   hydrALAZINE 50 MG tablet Commonly known as:  APRESOLINE Take 50 mg by mouth 2 (two) times daily.   irbesartan 300 MG tablet Commonly known as:  AVAPRO Take 300 mg by  mouth daily.   levothyroxine 150 MCG tablet Commonly known as:  SYNTHROID, LEVOTHROID Take 150 mcg by mouth daily before breakfast.   Lidocaine 4 % Ptch Commonly known as:  ASPERCREME LIDOCAINE Apply 3 patches topically once. Apply 1 patch to the left hip, left knee, and the right shoulder in the morning. Remove in the evening.   loratadine 10 MG tablet Commonly known as:  CLARITIN Take 10 mg by mouth daily. Reported on 09/22/2015   Melatonin 3 MG Tabs Take 1 tablet (3 mg total) by mouth at bedtime.   nystatin 100000 UNIT/ML suspension Commonly known as:  MYCOSTATIN Take 5 mLs by mouth 4 (four) times daily.   omeprazole 20 MG capsule Commonly known as:  PRILOSEC Take 1 capsule (20 mg total) by mouth daily.   oxyCODONE-acetaminophen 7.5-325 MG tablet Commonly known as:  PERCOCET Take 1 tablet by mouth 4 (four) times daily. Four times daily scheduled and one at midnight as needed.   polyethylene glycol powder powder Commonly known as:  GLYCOLAX/MIRALAX Take 17 grams in 6 oz water or juice daily to prevent constipation   Psyllium 28.3 % Powd Commonly known as:  METAMUCIL SMOOTH TEXTURE One  tablespoon in 6 oz wsater or juice daily to help prevent constipation   ROBAFEN DM 10-100 MG/5ML liquid Generic drug:  Dextromethorphan-Guaifenesin Take 5 mLs by mouth every 6 (six) hours as needed.   senna-docusate 8.6-50 MG tablet Commonly known as:  SENOKOT S 2 tablets nightly to prevent constipation   VITAMIN B-12 CR PO Take 1,000 mcg by mouth daily.            Discharge Care Instructions        Start     Ordered   01/10/17 2130  Basic metabolic panel    Comments:  This external order was created through the Results Console.    01/10/17 1008   01/10/17 0000  CBC and differential    Comments:  This external order was created through the Results Console.    01/10/17 1011   01/10/17 8657  Basic metabolic panel    Comments:  This external order was created through the Results Console.    01/10/17 1011   01/10/17 0000  Hepatic function panel    Comments:  This external order was created through the Results Console.    01/10/17 1011   01/10/17 0000  TSH    Comments:  This external order was created through the Results Console.    01/10/17 1011   01/10/17 8469  Basic metabolic panel    Comments:  This external order was created through the Results Console.    01/10/17 1013   01/10/17 6295  Basic metabolic panel    Comments:  This external order was created through the Results Console.    01/10/17 1014   01/10/17 0000  CBC and differential    Comments:  This external order was created through the Results Console.    01/10/17 1016   01/10/17 2841  Basic metabolic panel    Comments:  This external order was created through the Results Console.    01/10/17 1016   01/10/17 0000  Hepatic function panel    Comments:  This external order was created through the Results Console.    01/10/17 1016   01/10/17 0000  TSH    Comments:  This external order was created through the Results Console.    01/10/17 1016   01/10/17 0000  Magnesium    Comments:  This  order was  created through External Result Entry    01/10/17 1017      Review of Systems  Constitutional: Negative for activity change, appetite change, chills, fatigue and fever.  Eyes: Negative for pain, discharge, redness and itching.  Respiratory: Negative for cough, chest tightness, shortness of breath and wheezing.   Cardiovascular: Negative for chest pain, palpitations and leg swelling.  Gastrointestinal: Negative for abdominal distention, abdominal pain, constipation and nausea.  Endocrine: Negative for cold intolerance and heat intolerance.  Genitourinary: Negative for flank pain, frequency and urgency.  Musculoskeletal: Positive for arthralgias and gait problem.  Skin: Negative for color change, pallor and rash.  Neurological: Negative for dizziness, seizures, syncope, light-headedness and headaches.  Psychiatric/Behavioral: Negative for agitation, confusion and hallucinations. The patient is not nervous/anxious.     Immunization History  Administered Date(s) Administered  . Influenza Split 02/13/2013  . Influenza-Unspecified 02/27/2014, 02/12/2015, 02/25/2016  . PPD Test 09/09/2015  . Pneumococcal Conjugate-13 05/05/2016  . Pneumococcal Polysaccharide-23 02/13/2013  . Td 05/05/2016  . Zoster 05/04/2016   Pertinent  Health Maintenance Due  Topic Date Due  . INFLUENZA VACCINE  12/14/2016  . MAMMOGRAM  09/09/2017 (Originally 03/21/1945)  . PNA vac Low Risk Adult (2 of 2 - PCV13) 05/05/2017  . DEXA SCAN  Completed   Fall Risk  01/02/2017 11/10/2015 10/20/2015 10/20/2015 06/30/2015  Falls in the past year? No No Yes No No  Number falls in past yr: - - 2 or more - -  Injury with Fall? - - Yes - -  Risk Factor Category  - - - - -  Risk for fall due to : - - History of fall(s);Impaired balance/gait - -  Follow up - - - - -    Vitals:   01/10/17 1003  BP: (!) 160/92  Pulse: (!) 56  Resp: 20  Temp: 98.8 F (37.1 C)  SpO2: 98%  Weight: 138 lb 12.8 oz (63 kg)  Height: 5\' 8"  (1.727  m)   Body mass index is 21.1 kg/m. Physical Exam  Constitutional: She is oriented to person, place, and time. She appears well-developed and well-nourished. No distress.  HENT:  Head: Normocephalic.  Mouth/Throat: No oropharyngeal exudate.  Previous whitish patches on tongue has improved  Eyes: Pupils are equal, round, and reactive to light. Conjunctivae and EOM are normal. Right eye exhibits no discharge. Left eye exhibits no discharge. No scleral icterus.  Neck: Normal range of motion. No JVD present. No thyromegaly present.  Cardiovascular: Normal rate, regular rhythm, normal heart sounds and intact distal pulses.  Exam reveals no gallop and no friction rub.   No murmur heard. Pulmonary/Chest: Effort normal and breath sounds normal. No respiratory distress. She has no wheezes. She has no rales.  Abdominal: Soft. Bowel sounds are normal. She exhibits no distension and no mass. There is no tenderness. There is no rebound and no guarding.  Musculoskeletal: She exhibits no tenderness.  Moves x 4 extremities with slight limited ROM to knees due to arthritic pain. Unsteady gait uses Walker.   Lymphadenopathy:    She has no cervical adenopathy.  Neurological: She is oriented to person, place, and time. Coordination normal.  Skin: Skin is warm and dry. No rash noted. No erythema. No pallor.  Psychiatric: She has a normal mood and affect.    Labs reviewed:  Recent Labs  12/05/16 01/02/17 01/09/17  NA 128* 127* 129*  K 4.4 4.2 4.1  BUN 23* 28* 26*  CREATININE 1.1 1.2* 1.0  Recent Labs  06/23/16 09/19/16 01/02/17  AST 17 15 14   ALT 8 7 10   ALKPHOS 52 44 45    Recent Labs  05/05/16 06/23/16 09/19/16  WBC 4.4 5.6 4.4  HGB 11.7* 12.3 11.9*  HCT 35* 36 34*  PLT 158 159 156   Lab Results  Component Value Date   TSH 2.80 10/27/2016   No results found for: HGBA1C Lab Results  Component Value Date   CHOL 175 11/10/2014   HDL 53 11/10/2014   LDLCALC 87 11/10/2014   TRIG  173 (A) 11/10/2014    Significant Diagnostic Results in last 30 days:  No results found.  Assessment/Plan 1. Hyponatremia Chronic. Na + 133 (11/14/16); 128 (12/05/2016);127 ( 01/02/2017) and 129 (01/09/2017).She remains asymptomatic.Her chronic kidney disease could be a contributing factor.Her blood pressure reviewed B/P readings ranging in the 110's/70's-150's/80's with sporadic SBP in 160's-170's.will avoid initiating sodium tablet for now due to sporadic hypertension. Will consider checking her  ADH level for further evaluation.check urine osmolarity 01/12/2017. BMP 01/23/2017.   2. Hypocalcemia Ca 8.4 ( 01/09/2017). Continue to calcium supplement and vit D. Continue to monitor BMP  3. Essential hypertension  Facility blood pressure log reviewed B/P readings ranging in the 110's/70's-150's/80's with sporadic SBP in 160's-170's.continue on Hydralazine 50 mg Tablet twice daily, irbesartan 300 mg Tablet daily, doxazosin 4 mg tablet and Atenolol 25 mg tablet daily. Continue to monitor. BMP 01/23/2017.    Family/ staff Communication: Reviewed plan of care with patient and facility Nurse.   Labs/tests ordered:  urine osmolarity 01/12/2017. BMP 01/23/2017.    Sandrea Hughs, NP

## 2017-01-12 DIAGNOSIS — I10 Essential (primary) hypertension: Secondary | ICD-10-CM | POA: Diagnosis not present

## 2017-01-12 DIAGNOSIS — E871 Hypo-osmolality and hyponatremia: Secondary | ICD-10-CM | POA: Diagnosis not present

## 2017-01-18 ENCOUNTER — Other Ambulatory Visit: Payer: Self-pay | Admitting: *Deleted

## 2017-01-20 ENCOUNTER — Encounter: Payer: Self-pay | Admitting: Internal Medicine

## 2017-01-23 DIAGNOSIS — E871 Hypo-osmolality and hyponatremia: Secondary | ICD-10-CM | POA: Diagnosis not present

## 2017-01-23 DIAGNOSIS — I10 Essential (primary) hypertension: Secondary | ICD-10-CM | POA: Diagnosis not present

## 2017-01-23 LAB — BASIC METABOLIC PANEL
BUN: 22 — AB (ref 4–21)
Creatinine: 1.1 (ref 0.5–1.1)
GLUCOSE: 103
Potassium: 4.3 (ref 3.4–5.3)
Sodium: 129 — AB (ref 137–147)

## 2017-01-24 ENCOUNTER — Non-Acute Institutional Stay: Payer: Medicare Other | Admitting: Family

## 2017-01-24 ENCOUNTER — Encounter: Payer: Self-pay | Admitting: Family

## 2017-01-24 ENCOUNTER — Encounter: Payer: Medicare Other | Admitting: Internal Medicine

## 2017-01-24 DIAGNOSIS — N182 Chronic kidney disease, stage 2 (mild): Secondary | ICD-10-CM | POA: Diagnosis not present

## 2017-01-24 DIAGNOSIS — E871 Hypo-osmolality and hyponatremia: Secondary | ICD-10-CM

## 2017-01-24 DIAGNOSIS — I10 Essential (primary) hypertension: Secondary | ICD-10-CM

## 2017-01-24 NOTE — Progress Notes (Signed)
Location:  [facility] Room Number: 35 Place of Service:  ALF [phone]) Provider: Dinah Ngetich FNP-C  Blanchie Serve, MD  Patient Care Team: Blanchie Serve, MD as PCP - General (Internal Medicine) Ngetich, Nelda Bucks, NP as Nurse Practitioner (Family Medicine)  Extended Emergency Contact Information Primary Emergency Contact: Clairmont,[name] Address: [phone] [place]          [place], [place] [address] [name] of [place] Phone: [phone] Mobile Phone: [phone] Relation: Spouse Secondary Emergency Contact: Monts,[name] Address: [address]          [place],  [address] [name] of [place] Mobile Phone: [phone] Relation: Son  Code Status:  DNR Goals of care: Advanced Directive information Advanced Directives 01/24/2017  Does Patient Have a Medical Advance Directive? Yes  Type of Advance Directive Out of facility DNR (pink MOST or yellow form);Living will;Healthcare Power of Attorney  Does patient want to make changes to medical advance directive? -  Copy of [place] in Chart? Yes  Would patient like information on creating a medical advance directive? -  Pre-existing out of facility DNR order (yellow form or pink MOST form) Yellow form placed in chart (order not valid for inpatient use)     Chief Complaint  Patient presents with  . Acute Visit    abnormal labs and swelling to left wrist x1 week    HPI:  Pt is a 83 y.o. female seen today at [facility] for an acute visit for evaluation of abnormal lab results. She is seen in her room today.she has a significant medical history of hyponatremia,CKD stage 2, HTN, OA, hypothyroidism among other conditions. She denies any acute issues this visit. Her recent lab results showed BUN 22, CR 1.13, Na 129, CL 96 ( 01/23/2017). Previous Na 129 (01/09/2017). Her blood pressure prior to medication administration elevated at times SBP in the 150's-180's. She  denies any headache, dizziness, lightheaded, faint feeling or chest pain. Facility Nurse reports no new concerns. No recent fall episodes.    Past Medical History:  Diagnosis Date  . Acquired cyst of kidney    left  . Allergic rhinitis, cause unspecified   . Anemia, unspecified   . Chronic hyponatremia 09/05/2015  . CKD stage 2 due to type 2 diabetes mellitus () 11/18/2014  . Closed fracture of unspecified part of vertebral column without mention of spinal cord injury    T7 s/p kyphoplasty, T12  . Edema 2015  . Herpes simplex without mention of complication   . History of Epstein-Barr virus infection   . Hyposmolality and/or hyponatremia    07/13/15 Na 131  . Insomnia   . Insomnia, unspecified   . Left cavernous carotid aneurysm 08/2008   1-1/62mm aneurysm of cavernous carotid artery  . Malignant neoplasm of breast (female), unspecified site 1990   left. Had surgerey and chemo, but no radiation  . 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 Neuropathy   . Osteoarthrosis of knee    bilaterally  . Osteoarthrosis, hip   . Osteoarthrosis, unspecified whether generalized or localized, forearm    bilaterally wrist  . Other and unspecified nonspecific immunological findings    positive ANA  . Other B-complex deficiencies   . Pain in joint, site unspecified    severe, diffuse, chronic pain  . Positive ANA (antinuclear antibody)   . Pulmonary embolism ([place]) 05/07/2015  . Pure hypercholesterolemia   .  Senile osteoporosis   . Unspecified essential hypertension   . Unspecified gastritis and gastroduodenitis without mention of hemorrhage   . Unspecified hypothyroidism   . Unspecified vitamin D deficiency    Past Surgical History:  Procedure Laterality Date  . DILATION AND CURETTAGE OF UTERUS  X 2  . FEMUR IM NAIL Right 03/18/2015   Procedure: INTRAMEDULLARY (IM) NAIL FEMORAL;  Surgeon: Rod Can, MD;  Location: [facility];  Service:  Orthopedics;  Laterality: Right;  . FRACTURE SURGERY    . KYPHOPLASTY  07/03/2010   T12  . KYPHOPLASTY     C7  . LAPAROSCOPIC CHOLECYSTECTOMY  09/20/2006  . MASTECTOMY Left 04/29/1989  . MIDDLE EAR SURGERY Bilateral 1938  . NASAL SINUS SURGERY  1990 & 2000  . ORIF HIP FRACTURE Left 06/13/2007   following a syncopal episode  . TONSILLECTOMY  1968    Allergies  Allergen Reactions  . Aspirin     Drop in body temp with large quantities, can tolerate low doses of aspirin  . Penicillins Swelling    Has patient had a PCN reaction causing immediate rash, facial/tongue/throat swelling, SOB or lightheadedness with hypotension: No Has patient had a PCN reaction causing severe rash involving mucus membranes or skin necrosis: No Has patient had a PCN reaction that required hospitalization No Has patient had a PCN reaction occurring within the last 10 years: No If all of the above answers are "NO", then may proceed with Cephalosporin use.    Outpatient Encounter Prescriptions as of 01/24/2017  Medication Sig  . acetaminophen (TYLENOL) 325 MG tablet Take 650 mg by mouth every 4 (four) hours as needed.  Marland Kitchen apixaban (ELIQUIS) 2.5 MG TABS tablet Take 2.5 mg by mouth 2 (two) times daily.  Marland Kitchen atenolol (TENORMIN) 25 MG tablet Take 25 mg by mouth at bedtime.   . calcium citrate (CALCITRATE) 950 MG tablet Take 1 tablet (200 mg of elemental calcium total) by mouth daily.  . cholecalciferol (VITAMIN D) 1000 UNITS tablet Take 3,000 Units by mouth daily.  . cloNIDine (CATAPRES) 0.1 MG tablet Take one tablet every 8 hours as needed for BP > 180/100  . Cyanocobalamin (VITAMIN B-12 CR PO) Take 1,000 mcg by mouth daily.   Marland Kitchen denosumab (PROLIA) 60 MG/ML SOLN injection Inject 60 mg into the skin every 6 (six) months. Administer in upper arm, thigh, or abdomen  . Dextromethorphan-Guaifenesin (ROBAFEN DM) 10-100 MG/5ML liquid Take 5 mLs by mouth every 6 (six) hours as needed.  . doxazosin (CARDURA) 4 MG tablet One daily  to help control BP  . feeding supplement (BOOST / RESOURCE BREEZE) LIQD Take 1 Container by mouth daily with supper.   . hydrALAZINE (APRESOLINE) 50 MG tablet Take 50 mg by mouth 2 (two) times daily. Hold for BP <90/60  . irbesartan (AVAPRO) 300 MG tablet Take 300 mg by mouth daily.  Marland Kitchen levothyroxine (SYNTHROID, LEVOTHROID) 150 MCG tablet Take 150 mcg by mouth daily before breakfast.  . Lidocaine (ASPERCREME LIDOCAINE) 4 % PTCH Apply 3 patches topically once. Apply 1 patch to the left hip, left knee, and the right shoulder in the morning. Remove in the evening.  . loratadine (CLARITIN) 10 MG tablet Take 10 mg by mouth daily. Reported on 09/22/2015  . Melatonin 3 MG TABS Take 1 tablet (3 mg total) by mouth at bedtime.  Marland Kitchen omeprazole (PRILOSEC) 20 MG capsule Take 1 capsule (20 mg total) by mouth daily.  Marland Kitchen oxyCODONE-acetaminophen (PERCOCET) 7.5-325 MG tablet Take 1 tablet by mouth 4 (  four) times daily. Four times daily scheduled and one at midnight as needed.  . polyethylene glycol powder (GLYCOLAX/MIRALAX) powder Take 17 grams in 6 oz water or juice daily to prevent constipation  . Psyllium (METAMUCIL SMOOTH TEXTURE) 28.3 % POWD One tablespoon in 6 oz wsater or juice daily to help prevent constipation  . senna-docusate (SENOKOT S) 8.6-50 MG tablet 2 tablets nightly to prevent constipation   No facility-administered encounter medications on file as of 01/24/2017.     Review of Systems  Constitutional: Negative for activity change, appetite change, chills, fatigue and fever.  HENT: Negative for congestion, rhinorrhea, sinus pain, sinus pressure, sneezing and sore throat.   Eyes: Negative for pain, discharge, redness and itching.  Respiratory: Negative for cough, chest tightness, shortness of breath and wheezing.   Cardiovascular: Positive for leg swelling. Negative for chest pain and palpitations.  Gastrointestinal: Negative for abdominal distention, abdominal pain, constipation, diarrhea, nausea and  vomiting.  Genitourinary: Negative for dysuria, flank pain, frequency and urgency.  Musculoskeletal: Positive for arthralgias and gait problem.  Skin: Negative for color change, pallor and rash.  Neurological: Negative for dizziness, seizures, syncope, light-headedness and headaches.  Psychiatric/Behavioral: Negative for agitation, hallucinations and sleep disturbance. The patient is not nervous/anxious.     Immunization History  Administered Date(s) Administered  . Influenza Split 02/13/2013  . Influenza-Unspecified 02/27/2014, 02/12/2015, 02/25/2016  . PPD Test 09/09/2015  . Pneumococcal Conjugate-13 05/05/2016  . Pneumococcal Polysaccharide-23 02/13/2013  . Td 05/05/2016  . Zoster 05/04/2016   Pertinent  Health Maintenance Due  Topic Date Due  . INFLUENZA VACCINE  12/14/2016  . MAMMOGRAM  09/09/2017 (Originally 03/21/1945)  . DEXA SCAN  Completed  . PNA vac Low Risk Adult  Completed   Fall Risk  01/02/2017 11/10/2015 10/20/2015 10/20/2015 06/30/2015  Falls in the past year? No No Yes No No  Number falls in past yr: - - 2 or more - -  Injury with Fall? - - Yes - -  Risk Factor Category  - - - - -  Risk for fall due to : - - History of fall(s);Impaired balance/gait - -  Follow up - - - - -    Vitals:   01/24/17 0931  BP: (!) 168/90  Pulse: (!) 52  Resp: 20  Temp: (!) 97 F (36.1 C)  SpO2: 93%  Weight: 143 lb (64.9 kg)  Height: 5\' 8"  (1.727 m)   Body mass index is 21.74 kg/m. Physical Exam  Constitutional: She is oriented to person, place, and time. She appears well-developed and well-nourished.  Elderly   HENT:  Head: Normocephalic.  Right Ear: External ear normal.  Left Ear: External ear normal.  Mouth/Throat: Oropharynx is clear and moist. No oropharyngeal exudate.  Whitish patch coating on tongue has improved.   Eyes: Pupils are equal, round, and reactive to light. Conjunctivae and EOM are normal. Right eye exhibits no discharge. Left eye exhibits no discharge. No  scleral icterus.  Neck: Normal range of motion. No JVD present. No thyromegaly present.  Cardiovascular: Normal rate, regular rhythm, normal heart sounds and intact distal pulses.  Exam reveals no gallop and no friction rub.   No murmur heard. Pulmonary/Chest: Effort normal and breath sounds normal. No respiratory distress. She has no wheezes. She has no rales.  Abdominal: Soft. Bowel sounds are normal. She exhibits no distension. There is no tenderness. There is no rebound and no guarding.  Musculoskeletal: She exhibits no tenderness.  Unsteady gait uses FWW. Moves x 4 extremities  except limited ROM to knees due to arthritis. Bilateral lower extremities trace-1+ edema noted. Elevates legs on recliner.   Lymphadenopathy:    She has no cervical adenopathy.  Neurological: She is oriented to person, place, and time. Coordination normal.  Skin: Skin is warm and dry. No rash noted. No erythema. No pallor.  Psychiatric: She has a normal mood and affect.   Labs reviewed:  Recent Labs  01/02/17 01/09/17 01/23/17  NA 127* 129* 129*  K 4.2 4.1 4.3  BUN 28* 26* 22*  CREATININE 1.2* 1.0 1.1    Recent Labs  06/23/16 09/19/16 01/02/17  AST 17 15 14   ALT 8 7 10   ALKPHOS 52 44 45    Recent Labs  05/05/16 06/23/16 09/19/16  WBC 4.4 5.6 4.4  HGB 11.7* 12.3 11.9*  HCT 35* 36 34*  PLT 158 159 156   Lab Results  Component Value Date   TSH 2.80 10/27/2016   No results found for: HGBA1C Lab Results  Component Value Date   CHOL 175 11/10/2014   HDL 53 11/10/2014   LDLCALC 87 11/10/2014   TRIG 173 (A) 11/10/2014   Significant Diagnostic Results in last 30 days:  No results found.  Assessment/Plan 1. Hyponatremia Recent Na + 129 ( 01/23/2017) level stable as compared to last lab results.Asymptomatic. Will continue to monitor for now. Recheck CMP in 4 weeks.   2. CKD (chronic kidney disease) stage 2, GFR 60-89 ml/min CR 1.13 ( 01/23/2017) at her baseline. Continue to avoid nephrotoxins  and dose all other medications for renal clearance. CMP in 4 weeks.   3. Hypertension with CKD  SBP elevated prior medication administration but within range after medications are given.asymptomatic during visit.Continue to monitor.    Family/ staff Communication: Reviewed plan of care with patient and facility Nurse supervisor  Labs/tests ordered: CMP in 4 weeks.  Sandrea Hughs, NP

## 2017-01-25 ENCOUNTER — Encounter: Payer: Medicare Other | Admitting: Internal Medicine

## 2017-01-26 ENCOUNTER — Encounter: Payer: Medicare Other | Admitting: Internal Medicine

## 2017-01-29 DIAGNOSIS — E871 Hypo-osmolality and hyponatremia: Secondary | ICD-10-CM | POA: Diagnosis not present

## 2017-01-29 DIAGNOSIS — I10 Essential (primary) hypertension: Secondary | ICD-10-CM | POA: Diagnosis not present

## 2017-01-30 LAB — BASIC METABOLIC PANEL
BUN: 23 — AB (ref 4–21)
CREATININE: 1 (ref 0.5–1.1)
GLUCOSE: 121
POTASSIUM: 4.1 (ref 3.4–5.3)
Sodium: 126 — AB (ref 137–147)

## 2017-01-31 ENCOUNTER — Encounter: Payer: Self-pay | Admitting: Internal Medicine

## 2017-02-06 ENCOUNTER — Encounter: Payer: Self-pay | Admitting: Internal Medicine

## 2017-02-06 ENCOUNTER — Non-Acute Institutional Stay: Payer: Medicare Other | Admitting: Internal Medicine

## 2017-02-06 ENCOUNTER — Encounter: Payer: Medicare Other | Admitting: Internal Medicine

## 2017-02-06 DIAGNOSIS — R600 Localized edema: Secondary | ICD-10-CM | POA: Diagnosis not present

## 2017-02-06 DIAGNOSIS — B370 Candidal stomatitis: Secondary | ICD-10-CM

## 2017-02-06 DIAGNOSIS — E871 Hypo-osmolality and hyponatremia: Secondary | ICD-10-CM | POA: Diagnosis not present

## 2017-02-06 NOTE — Progress Notes (Signed)
Location:  [facility] Room Number: 35 Place of Service:  ALF [phone]) Provider:  Blanchie Serve, MD  Blanchie Serve, MD  Patient Care Team: Blanchie Serve, MD as PCP - General (Internal Medicine) Ngetich, Nelda Bucks, NP as Nurse Practitioner (Family Medicine)  Extended Emergency Contact Information Primary Emergency Contact: Szczesny,[name] Address: [phone] [place]          [place], [place] [address] [name] of [place] Phone: [phone] Mobile Phone: [phone] Relation: Spouse Secondary Emergency Contact: Gerken,[name] Address: [address]          [place], [place] [address] [name] of [place] Mobile Phone: [phone] Relation: Son  Code Status:  DNR Goals of care: Advanced Directive information Advanced Directives 02/06/2017  Does Patient Have a Medical Advance Directive? Yes  Type of Paramedic of [place];Living will;Out of facility DNR (pink MOST or yellow form)  Does patient want to make changes to medical advance directive? No - Patient declined  Copy of [place] in Chart? Yes  Would patient like information on creating a medical advance directive? -  Pre-existing out of facility DNR order (yellow form or pink MOST form) Yellow form placed in chart (order not valid for inpatient use)     Chief Complaint  Patient presents with  . Acute Visit    Left foot swelling with pain  . Acute Visit    white patch to tongue    HPI:  Pt is a 83 y.o. female seen today for an acute visit for swelling with discomfort to left foot. She has noticed increased edema to her left foot x 1 week now. She complaints of it feeling tight. She has been walking for activity. She has history of PVD and does not tolerate ted hose. Staff have noticed white patch to her tongue for few days. She has required care for oral thrush in the past.    Past Medical History:  Diagnosis Date  . Acquired cyst of  kidney    left  . Allergic rhinitis, cause unspecified   . Anemia, unspecified   . Chronic hyponatremia 09/05/2015  . CKD stage 2 due to type 2 diabetes mellitus ([place]) 11/18/2014  . Closed fracture of unspecified part of vertebral column without mention of spinal cord injury    T7 s/p kyphoplasty, T12  . Edema 2015  . Herpes simplex without mention of complication   . History of Epstein-Barr virus infection   . Hyposmolality and/or hyponatremia    07/13/15 Na 131  . Insomnia   . Insomnia, unspecified   . Left cavernous carotid aneurysm 08/2008   1-1/73mm aneurysm of cavernous carotid artery  . Malignant neoplasm of breast (female), unspecified site 1990   left. Had surgerey and chemo, but no radiation  . 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 Neuropathy   . Osteoarthrosis of knee    bilaterally  . Osteoarthrosis, hip   . Osteoarthrosis, unspecified whether generalized or localized, forearm    bilaterally wrist  . Other and unspecified nonspecific immunological findings    positive ANA  . Other B-complex deficiencies   . Pain in joint, site unspecified    severe, diffuse, chronic pain  . Positive ANA (antinuclear antibody)   . Pulmonary embolism ([place]) 05/07/2015  . Pure hypercholesterolemia   . Senile osteoporosis   . Unspecified essential hypertension   . Unspecified gastritis and gastroduodenitis without mention  of hemorrhage   . Unspecified hypothyroidism   . Unspecified vitamin D deficiency    Past Surgical History:  Procedure Laterality Date  . DILATION AND CURETTAGE OF UTERUS  X 2  . FEMUR IM NAIL Right 03/18/2015   Procedure: INTRAMEDULLARY (IM) NAIL FEMORAL;  Surgeon: Rod Can, MD;  Location: [facility];  Service: Orthopedics;  Laterality: Right;  . FRACTURE SURGERY    . KYPHOPLASTY  07/03/2010   T12  . KYPHOPLASTY     C7  . LAPAROSCOPIC CHOLECYSTECTOMY  09/20/2006  . MASTECTOMY Left 04/29/1989  . MIDDLE EAR SURGERY  Bilateral 1938  . NASAL SINUS SURGERY  1990 & 2000  . ORIF HIP FRACTURE Left 06/13/2007   following a syncopal episode  . TONSILLECTOMY  1968    Allergies  Allergen Reactions  . Aspirin     Drop in body temp with large quantities, can tolerate low doses of aspirin  . Penicillins Swelling    Has patient had a PCN reaction causing immediate rash, facial/tongue/throat swelling, SOB or lightheadedness with hypotension: No Has patient had a PCN reaction causing severe rash involving mucus membranes or skin necrosis: No Has patient had a PCN reaction that required hospitalization No Has patient had a PCN reaction occurring within the last 10 years: No If all of the above answers are "NO", then may proceed with Cephalosporin use.    Outpatient Encounter Prescriptions as of 02/06/2017  Medication Sig  . acetaminophen (TYLENOL) 325 MG tablet Take 650 mg by mouth every 4 (four) hours as needed.  Marland Kitchen apixaban (ELIQUIS) 2.5 MG TABS tablet Take 2.5 mg by mouth 2 (two) times daily.  Marland Kitchen atenolol (TENORMIN) 25 MG tablet Take 25 mg by mouth at bedtime.   . calcium citrate (CALCITRATE) 950 MG tablet Take 1 tablet (200 mg of elemental calcium total) by mouth daily.  . cholecalciferol (VITAMIN D) 1000 UNITS tablet Take 3,000 Units by mouth daily.  . cloNIDine (CATAPRES) 0.1 MG tablet Take one tablet every 8 hours as needed for BP > 180/100  . Cyanocobalamin (VITAMIN B-12 CR PO) Take 1,000 mcg by mouth daily.   Marland Kitchen denosumab (PROLIA) 60 MG/ML SOLN injection Inject 60 mg into the skin every 6 (six) months. Administer in upper arm, thigh, or abdomen  . Dextromethorphan-Guaifenesin (ROBAFEN DM) 10-100 MG/5ML liquid Take 10 mLs by mouth every 6 (six) hours as needed.   Marland Kitchen DM-Benzocaine-Menthol 09-18-08 MG LOZG Use as directed 1 lozenge in the mouth or throat every 2 (two) hours as needed.  . doxazosin (CARDURA) 4 MG tablet One daily to help control BP  . feeding supplement (BOOST / RESOURCE BREEZE) LIQD Take 1  Container by mouth 2 (two) times daily between meals.   . hydrALAZINE (APRESOLINE) 50 MG tablet Take 50 mg by mouth 2 (two) times daily. Hold for BP <90/60  . irbesartan (AVAPRO) 300 MG tablet Take 300 mg by mouth daily.  Marland Kitchen levothyroxine (SYNTHROID, LEVOTHROID) 150 MCG tablet Take 150 mcg by mouth daily before breakfast.  . Lidocaine (ASPERCREME LIDOCAINE) 4 % PTCH Apply 3 patches topically once. Apply 1 patch to the left hip, left knee, and the right shoulder in the morning. Remove in the evening.  . loratadine (CLARITIN) 10 MG tablet Take 10 mg by mouth daily. Reported on 09/22/2015  . Melatonin 3 MG TABS Take 1 tablet (3 mg total) by mouth at bedtime.  Marland Kitchen omeprazole (PRILOSEC) 20 MG capsule Take 1 capsule (20 mg total) by mouth daily.  Marland Kitchen oxyCODONE-acetaminophen (PERCOCET)  7.5-325 MG tablet Take 1 tablet by mouth 4 (four) times daily. Four times daily scheduled and one at midnight as needed.  . polyethylene glycol powder (GLYCOLAX/MIRALAX) powder Take 17 grams in 6 oz water or juice daily to prevent constipation  . Psyllium (METAMUCIL SMOOTH TEXTURE) 28.3 % POWD One tablespoon in 6 oz wsater or juice daily to help prevent constipation  . senna-docusate (SENOKOT S) 8.6-50 MG tablet 2 tablets nightly to prevent constipation   No facility-administered encounter medications on file as of 02/06/2017.     Review of Systems  Constitutional: Negative for chills and fever.  HENT: Negative for congestion, mouth sores, rhinorrhea, sore throat and trouble swallowing.   Respiratory: Negative for shortness of breath.   Cardiovascular: Negative for chest pain and palpitations.  Gastrointestinal: Negative for abdominal pain, nausea and vomiting.  Musculoskeletal: Positive for gait problem.  Neurological: Negative for light-headedness.  Psychiatric/Behavioral: Negative for confusion.    Immunization History  Administered Date(s) Administered  . Influenza Split 02/13/2013  . Influenza-Unspecified  02/27/2014, 02/12/2015, 02/25/2016  . PPD Test 09/09/2015  . Pneumococcal Conjugate-13 05/05/2016  . Pneumococcal Polysaccharide-23 02/13/2013  . Td 05/05/2016  . Zoster 05/04/2016   Pertinent  Health Maintenance Due  Topic Date Due  . INFLUENZA VACCINE  02/13/2017 (Originally 12/14/2016)  . MAMMOGRAM  09/09/2017 (Originally 03/21/1945)  . DEXA SCAN  Completed  . PNA vac Low Risk Adult  Completed   Fall Risk  01/02/2017 11/10/2015 10/20/2015 10/20/2015 06/30/2015  Falls in the past year? No No Yes No No  Number falls in past yr: - - 2 or more - -  Injury with Fall? - - Yes - -  Risk Factor Category  - - - - -  Risk for fall due to : - - History of fall(s);Impaired balance/gait - -  Follow up - - - - -   Functional Status Survey:    Vitals:   02/06/17 1044  BP: (!) 152/74  Pulse: 60  Resp: 20  Temp: 98.3 F (36.8 C)  TempSrc: Oral  SpO2: 95%  Weight: 143 lb (64.9 kg)  Height: 5\' 8"  (1.727 m)   Body mass index is 21.74 kg/m. Physical Exam  Constitutional: She is oriented to person, place, and time. She appears well-developed and well-nourished. No distress.  HENT:  Head: Normocephalic and atraumatic.  Mouth/Throat: Oropharynx is clear and moist.  White coating to middle of her tongue  Eyes: Pupils are equal, round, and reactive to light. Conjunctivae are normal.  Neck: Normal range of motion. Neck supple.  Cardiovascular: Normal rate and regular rhythm.   Pulmonary/Chest: Effort normal and breath sounds normal.  Abdominal: Soft. Bowel sounds are normal.  Musculoskeletal: She exhibits edema.  Left foot edema, dp palpable  Lymphadenopathy:    She has no cervical adenopathy.  Neurological: She is alert and oriented to person, place, and time.  Skin: Skin is warm and dry.  Psychiatric: She has a normal mood and affect.    Labs reviewed:  Recent Labs  01/09/17 01/23/17 01/30/17  NA 129* 129* 126*  K 4.1 4.3 4.1  BUN 26* 22* 23*  CREATININE 1.0 1.1 1.0    Recent  Labs  06/23/16 09/19/16 01/02/17  AST 17 15 14   ALT 8 7 10   ALKPHOS 52 44 45    Recent Labs  05/05/16 06/23/16 09/19/16  WBC 4.4 5.6 4.4  HGB 11.7* 12.3 11.9*  HCT 35* 36 34*  PLT 158 159 156   Lab Results  Component  Value Date   TSH 2.80 10/27/2016   No results found for: HGBA1C Lab Results  Component Value Date   CHOL 175 11/10/2014   HDL 53 11/10/2014   LDLCALC 87 11/10/2014   TRIG 173 (A) 11/10/2014    Significant Diagnostic Results in last 30 days:  No results found.  Assessment/Plan  Left foot edema Echocardiogram EF 60-65% 05/08/15. Keep legs elevated at rest for now with minimal edema of her feet. dp palpable. No claudication pain. Monitor clinically for now  Oral thrush Oral care qid along with care for dentures. Nystatin swish and spit qid x 2 weeks. Monitor clinically.   Family/ staff Communication: reviewed care plan with patient and charge nurse.    Labs/tests ordered:  None   Blanchie Serve, MD Internal Medicine [facility] [address] [place], [place] [address] Cell Phone (Monday-Friday 8 am - 5 pm): [phone] On Call: [phone] and follow prompts after 5 pm and on weekends Office Phone: [phone] Office Fax: [phone]

## 2017-02-13 DIAGNOSIS — E871 Hypo-osmolality and hyponatremia: Secondary | ICD-10-CM | POA: Diagnosis not present

## 2017-02-13 DIAGNOSIS — I10 Essential (primary) hypertension: Secondary | ICD-10-CM | POA: Diagnosis not present

## 2017-02-13 LAB — BASIC METABOLIC PANEL
BUN: 27 — AB (ref 4–21)
CREATININE: 1.2 — AB (ref 0.5–1.1)
Glucose: 96
Potassium: 4.3 (ref 3.4–5.3)
Sodium: 125 — AB (ref 137–147)

## 2017-02-15 ENCOUNTER — Non-Acute Institutional Stay: Payer: Medicare Other | Admitting: Internal Medicine

## 2017-02-15 ENCOUNTER — Encounter: Payer: Self-pay | Admitting: Internal Medicine

## 2017-02-15 VITALS — BP 142/80 | HR 60 | Temp 97.7°F | Resp 16 | Ht 68.0 in | Wt 140.0 lb

## 2017-02-15 DIAGNOSIS — E871 Hypo-osmolality and hyponatremia: Secondary | ICD-10-CM

## 2017-02-15 DIAGNOSIS — Z86711 Personal history of pulmonary embolism: Secondary | ICD-10-CM | POA: Diagnosis not present

## 2017-02-15 DIAGNOSIS — B370 Candidal stomatitis: Secondary | ICD-10-CM

## 2017-02-15 DIAGNOSIS — E538 Deficiency of other specified B group vitamins: Secondary | ICD-10-CM | POA: Diagnosis not present

## 2017-02-15 DIAGNOSIS — E039 Hypothyroidism, unspecified: Secondary | ICD-10-CM | POA: Diagnosis not present

## 2017-02-15 DIAGNOSIS — M810 Age-related osteoporosis without current pathological fracture: Secondary | ICD-10-CM | POA: Diagnosis not present

## 2017-02-15 DIAGNOSIS — K5909 Other constipation: Secondary | ICD-10-CM

## 2017-02-15 DIAGNOSIS — J309 Allergic rhinitis, unspecified: Secondary | ICD-10-CM

## 2017-02-15 DIAGNOSIS — K219 Gastro-esophageal reflux disease without esophagitis: Secondary | ICD-10-CM

## 2017-02-15 DIAGNOSIS — M1990 Unspecified osteoarthritis, unspecified site: Secondary | ICD-10-CM | POA: Diagnosis not present

## 2017-02-15 DIAGNOSIS — I10 Essential (primary) hypertension: Secondary | ICD-10-CM | POA: Diagnosis not present

## 2017-02-15 MED ORDER — FEXOFENADINE HCL 60 MG PO TABS: 60.0000 mg | ORAL_TABLET | Freq: Two times a day (BID) | ORAL | 0 refills | Status: DC

## 2017-02-15 MED ORDER — OMEPRAZOLE 10 MG PO CPDR: 10.0000 mg | DELAYED_RELEASE_CAPSULE | Freq: Every day | ORAL | 0 refills | Status: DC

## 2017-02-15 NOTE — Progress Notes (Signed)
[facility]  Provider: Blanchie Serve MD   Location:  [place] of Service:  Clinic (12)  PCP: Blanchie Serve, MD Patient Care Team: Blanchie Serve, MD as PCP - General (Internal Medicine) Ngetich, Nelda Bucks, NP as Nurse Practitioner (Family Medicine)  Extended Emergency Contact Information Primary Emergency Contact: BaileyDavid Address: [address] [place]          [place], [place] [address] [name] Phone: [phone] Mobile Phone: [phone] Relation: Spouse Secondary Emergency Contact: Stangeland,[name] Address: [address]          [place], [place] [address] [name] Phone: [phone] Relation: Son  Goals of Care: Advanced Directive information Advanced Directives 9/Bailey/2018  Does Patient Have a Medical Advance Directive? Yes  Type of Paramedic of [place];Living will;Out of facility DNR (pink MOST or yellow form)  Does patient want to make changes to medical advance directive? No - Patient declined  Copy of Bailey Bailey [name] in Chart? Yes  Would patient like information on creating a medical advance directive? -  Pre-existing out of facility DNR order (yellow form or pink MOST form) Yellow form placed in chart (order not valid for inpatient use)    Chief Complaint  Patient presents with  . Medical Management of Chronic Issues    3 month follow up. recurrent thrush, pain, cough and chest congestion  . Results    Discuss labs    HPI: Patient is a 83 y.o. Bailey [name] seen today for routine follow up visit. Her son present during the visit.   Hypertension- few elevated BP readings in ALF. Currently on hydralazine 50 mg bid, irbesartan 300 mg daily, atenolol 25 mg qhs, doxazsoin 4 mg daily and prn clonidine.  History of PE- breathing is stable. Taking apixaban 2.5 mg bid  Generalized arthritis- has RA and OA. Currently on oxycodone-acetaminophen  7.5-325 mg qid with acetaminophen 650 mg q4h prn pain. Also on lidocaine patch. Complaints of meds not helping.   Oral thrush- recurrent, has dentures, denies oral pain or mouth sore. Currently on nystatin swish and spit   Chronic constipation- has been on opioids chronically. miralax and senokot s helps with her bowel movement.  Hypothyroidism- taking levothyroxine  Allergic rhinitis- on daily claritin with prn cough expectorant.   b12 def- taking b12 supplement  Osteoporosis- on prolia injection every 6 months with calcium and vit d supplement. No recent dexa  gerd- controlled symptom, currently on omeprazole 20 mg daily   Past Medical History:  Diagnosis Date  . Acquired cyst of kidney    left  . Allergic rhinitis, cause unspecified   . Anemia, unspecified   . Chronic hyponatremia 09/05/2015  . CKD stage 2 due to type 2 diabetes mellitus ([place]) 11/18/2014  . Closed fracture of unspecified part of vertebral column without mention of spinal cord injury    T7 s/p kyphoplasty, T12  . Edema 2015  . Herpes simplex without mention of complication   . History of Epstein-Barr virus infection   . Hyposmolality and/or hyponatremia    07/13/15 Na 131  . Insomnia   . Insomnia, unspecified   . Left cavernous carotid aneurysm 08/2008   1-1/53mm aneurysm of cavernous carotid artery  . Malignant neoplasm of breast (Bailey [name]), unspecified site 1990   left. Had surgerey and chemo, but no radiation  . 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 Neuropathy   . Osteoarthrosis of knee    bilaterally  . Osteoarthrosis, hip   . Osteoarthrosis, unspecified whether generalized or localized, forearm    bilaterally wrist  . Other and unspecified nonspecific immunological findings    positive ANA  . Other B-complex deficiencies   . Pain in joint, site unspecified    severe, diffuse, chronic pain  . Positive ANA (antinuclear antibody)   . Pulmonary embolism  ([facility]) 05/07/2015  . Pure hypercholesterolemia   . Senile osteoporosis   . Unspecified essential hypertension   . Unspecified gastritis and gastroduodenitis without mention of hemorrhage   . Unspecified hypothyroidism   . Unspecified vitamin D deficiency    Past Surgical History:  Procedure Laterality Date  . DILATION AND CURETTAGE OF UTERUS  X 2  . FEMUR IM NAIL Right 03/18/2015   Procedure: INTRAMEDULLARY (IM) NAIL FEMORAL;  Surgeon: Rod Can, MD;  Location: [facility];  Service: Orthopedics;  Laterality: Right;  . FRACTURE SURGERY    . KYPHOPLASTY  07/03/2010   T12  . KYPHOPLASTY     C7  . LAPAROSCOPIC CHOLECYSTECTOMY  09/20/2006  . MASTECTOMY Left 04/29/1989  . MIDDLE EAR SURGERY Bilateral 1938  . NASAL SINUS SURGERY  1990 & 2000  . ORIF HIP FRACTURE Left 06/13/2007   following a syncopal episode  . TONSILLECTOMY  1968    reports that she has never smoked. She has never used smokeless tobacco. She reports that she drinks alcohol. She reports that she does not use drugs. Social History   Social History  . Marital status: Married    Spouse name: N/A  . Number of children: N/A  . Years of education: N/A   Occupational History  . retired Marine scientist    Social History Main Topics  . Smoking status: Never Smoker  . Smokeless tobacco: Never Used  . Alcohol use Yes     Comment: 05/08/2015 "I'll have a glass of white wine q now and then"  . Drug use: No  . Sexual activity: No   Other Topics Concern  . Not on file   Social History Narrative   Lives at [facility] since 10/16/2013, moved from [place]    Married 1968   Never smoked   No alcohol    Exercise walking, walks with cane   POA/Living Will                 History reviewed. No pertinent family history.  Health Maintenance  Topic Date Due  . INFLUENZA VACCINE  02/22/2017 (Originally 12/14/2016)  . MAMMOGRAM  09/09/2017 (Originally 03/21/1945)  . TETANUS/TDAP  05/05/2026  . DEXA SCAN  Completed  . PNA vac Low Risk  Adult  Completed    Allergies  Allergen Reactions  . Aspirin     Drop in body temp with large quantities, can tolerate low doses of aspirin  . Penicillins Swelling    Has patient had a PCN reaction causing immediate rash, facial/tongue/throat swelling, SOB or lightheadedness with hypotension: No Has patient had a PCN reaction causing severe rash involving mucus membranes or skin necrosis: No Has patient had a PCN reaction that required hospitalization No Has patient had a PCN reaction occurring within the last 10 years: No If all of the above answers are "NO", then may proceed with Cephalosporin use.    Outpatient Encounter Prescriptions as of 02/15/2017  Medication Sig  . acetaminophen (TYLENOL) 325 MG tablet Take 650 mg by mouth every 4 (four) hours  as needed.  Marland Kitchen alum & mag hydroxide-simeth (MINTOX) 621-308-65 MG/5ML suspension Take 30 mLs by mouth every 4 (four) hours as needed for indigestion or heartburn.  Marland Kitchen apixaban (ELIQUIS) 2.5 MG TABS tablet Take 2.5 mg by mouth 2 (two) times daily.  Marland Kitchen atenolol (TENORMIN) 25 MG tablet Take 25 mg by mouth at bedtime.   . calcium citrate (CALCITRATE) 950 MG tablet Take 1 tablet (200 mg of elemental calcium total) by mouth daily.  . cholecalciferol (VITAMIN D) 1000 UNITS tablet Take 3,000 Units by mouth daily.  . Cyanocobalamin (VITAMIN B-12 CR PO) Take 1,000 mcg by mouth daily.   Marland Kitchen denosumab (PROLIA) 60 MG/ML SOLN injection Inject 60 mg into the skin every 6 (six) months. Administer in upper arm, thigh, or abdomen  . Dextromethorphan-Guaifenesin (ROBAFEN DM) 10-100 MG/5ML liquid Take 10 mLs by mouth every 6 (six) hours as needed.   . doxazosin (CARDURA) 4 MG tablet One daily to help control BP  . feeding supplement (BOOST / RESOURCE BREEZE) LIQD Take 1 Container by mouth 2 (two) times daily between meals.   . hydrALAZINE (APRESOLINE) 50 MG tablet Take 50 mg by mouth 2 (two) times daily. Hold for BP <90/60  . irbesartan (AVAPRO) 300 MG tablet Take  300 mg by mouth daily.  Marland Kitchen levothyroxine (SYNTHROID, LEVOTHROID) 150 MCG tablet Take 150 mcg by mouth daily before breakfast.  . Lidocaine (ASPERCREME LIDOCAINE) 4 % PTCH Apply 3 patches topically once. Apply 1 patch to the left hip, left knee, and the right shoulder in the morning. Remove in the evening.  . loperamide (IMODIUM A-D) 2 MG tablet Take 2 mg by mouth as needed for diarrhea or loose stools.  Marland Kitchen loratadine (CLARITIN) 10 MG tablet Take 10 mg by mouth daily. Reported on 09/22/2015  . Melatonin 3 MG TABS Take 1 tablet (3 mg total) by mouth at bedtime.  Marland Kitchen nystatin (MYCOSTATIN) 100000 UNIT/ML suspension Take 5 mLs by mouth 4 (four) times daily.  Marland Kitchen omeprazole (PRILOSEC) 20 MG capsule Take 1 capsule (20 mg total) by mouth daily.  Marland Kitchen oxyCODONE-acetaminophen (PERCOCET) 7.5-325 MG tablet Take 1 tablet by mouth 4 (four) times daily. Four times daily scheduled and one at midnight as needed.  . polyethylene glycol powder (GLYCOLAX/MIRALAX) powder Take 17 grams in 6 oz water or juice daily to prevent constipation  . Psyllium (METAMUCIL SMOOTH TEXTURE) 28.3 % POWD One tablespoon in 6 oz wsater or juice daily to help prevent constipation  . senna-docusate (SENOKOT S) 8.6-50 MG tablet 2 tablets nightly to prevent constipation  . [DISCONTINUED] cloNIDine (CATAPRES) 0.1 MG tablet Take one tablet every 8 hours as needed for BP > 180/100  . [DISCONTINUED] DM-Benzocaine-Menthol 09-18-08 MG LOZG Use as directed 1 lozenge in the mouth or throat every 2 (two) hours as needed.   No facility-administered encounter medications on file as of 02/15/2017.     Review of Systems  Constitutional: Negative for appetite change, chills, fatigue and fever.  HENT: Positive for congestion, hearing loss and rhinorrhea. Negative for ear discharge, ear pain, mouth sores, nosebleeds, sinus pain, sinus pressure, sore throat and trouble swallowing.   Eyes: Negative for visual disturbance.  Respiratory: Negative for cough and shortness  of breath.   Cardiovascular: Positive for leg swelling. Negative for chest pain and palpitations.       Foot swelling  Gastrointestinal: Positive for constipation. Negative for abdominal pain, blood in stool, nausea and vomiting.  Genitourinary: Negative for dysuria, frequency and pelvic pain.  Musculoskeletal: Positive for arthralgias,  back pain and gait problem.       Uses a walker  Skin: Negative for rash.  Neurological: Negative for dizziness, tremors and headaches.  Hematological: Bruises/bleeds easily.  Psychiatric/Behavioral: Negative for behavioral problems and confusion. The patient is not nervous/anxious.     Vitals:   02/15/17 1144  BP: (!) 142/80  Pulse: 60  Resp: 16  Temp: 97.7 F (36.5 C)  TempSrc: Oral  SpO2: 98%  Weight: 140 lb (63.5 kg)  Height: 5\' 8"  (1.727 m)   Body mass index is 21.29 kg/m.   Wt Readings from Last 3 Encounters:  02/15/17 140 lb (63.5 kg)  09/Bailey/18 143 lb (64.9 kg)  01/24/17 143 lb (64.9 kg)   Physical Exam  Constitutional: She is oriented to person, place, and time. She appears well-developed and well-nourished. No distress.  HENT:  Head: Normocephalic and atraumatic.  Mouth/Throat: Oropharynx is clear and moist. No oropharyngeal exudate.  Has dentures, white coating to mid tongue, able to scrape it some with tongue depressor  Eyes: Pupils are equal, round, and reactive to light. Conjunctivae and EOM are normal. Right eye exhibits no discharge. Left eye exhibits no discharge.  Neck: Normal range of motion. Neck supple.  Cardiovascular: Normal rate and regular rhythm.   Pulmonary/Chest: Effort normal and breath sounds normal. No respiratory distress. She has no wheezes. She has no rales.  Abdominal: Soft. Bowel sounds are normal. She exhibits no distension. There is no rebound and no guarding.  Musculoskeletal: She exhibits edema.  Can move all 4 extremities, arthritis changes to joints, trace edema mainly to left foot    Lymphadenopathy:    She has no cervical adenopathy.  Neurological: She is alert and oriented to person, place, and time.  Skin: Skin is warm and dry. She is not diaphoretic.  Psychiatric: She has a normal mood and affect.    Labs reviewed: Basic Metabolic Panel:  Recent Labs  01/09/17 01/23/17 01/30/17  NA 129* 129* 126*  K 4.1 4.3 4.1  BUN 26* 22* 23*  CREATININE 1.0 1.1 1.0   Liver Function Tests:  Recent Labs  06/23/16 09/19/16 01/02/17  AST 17 15 14   ALT 8 7 10   ALKPHOS 52 44 45   No results for input(s): LIPASE, AMYLASE in the last 8760 hours. No results for input(s): AMMONIA in the last 8760 hours. CBC:  Recent Labs  05/05/16 06/23/16 09/19/16  WBC 4.4 5.6 4.4  HGB 11.7* 12.3 11.9*  HCT 35* 36 34*  PLT 158 159 156   Cardiac Enzymes: No results for input(s): CKTOTAL, CKMB, CKMBINDEX, TROPONINI in the last 8760 hours. BNP: Invalid input(s): POCBNP No results found for: HGBA1C Lab Results  Component Value Date   TSH 2.80 10/27/2016   Lab Results  Component Value Date   VITAMINB12 1,380 (H) 09/05/2015   Lab Results  Component Value Date   FOLATE 23.4 09/05/2015   Lab Results  Component Value Date   IRON 75 09/05/2015   TIBC 265 09/05/2015   FERRITIN 124 09/05/2015    Lipid Panel: No results for input(s): CHOL, HDL, LDLCALC, TRIG, CHOLHDL, LDLDIRECT in the last 8760 hours. No results found for: HGBA1C  Procedures since last visit: No results found.  Assessment/Plan  Hypertension few elevated BP readings in ALF. Currently on hydralazine 50 mg bid, irbesartan 300 mg daily, atenolol 25 mg qhs, doxazsoin 4 mg daily and prn clonidine. D/c prn clonidine. Monitor BP and increase hydralazine if needed.   History of PE Continue apixaban 2.5  mg bid  Generalized arthritis continue oxycodone-acetaminophen 7.5-325 mg qid and lidocaine patch. D/c prn tylenol order. Start tylenol 500 mg bid. Reassess in few weeks  Oral thrush has dentures, denies  oral pain or mouth sore. Currently on nystatin swish and spit. Continue this and also soak her dentures overnight in nystatin suspension. Maintain oral hygiene.   Chronic constipation Continue miralax and senokot s   Hypothyroidism Lab Results  Component Value Date   TSH 2.80 10/27/2016  Continue levothyroxine.   Allergic rhinitis On claritin but feels congested and has increased runny nose. D/c claritin and start fexofenadine 60 mg bid and monitor  b12 def Check b12. Continue b12 supplement for now  Osteoporosis Give prolia every 6 month. Obtain dexa. Continue ca- vit d  gerd Contorlled. Decrease omeprazole to 10 mg daily  Hyponatremia No diarrhea, vomiting reported. Not on any diuretic drug. TSH, LFT stable. Denies polydypsia. Around 131 in may and now 126. Check serum osmolality and urine osmolality with urine sodium level. Also check am cortisol level to assess further. Has ckd stage 2 and this could contribute some. Maintain hydration.    Labs/tests ordered:  bmp, b12, serum osmolality, urine na and urine osmolality  Next appointment: 3 weeks  Communication: reviewed care plan with patient, her son and charge nurse.     Blanchie Serve, MD Internal Medicine [facility] [address] [place], [place] [address] Cell Phone (Monday-Friday 8 am - 5 pm): [phone] On Call: [phone] and follow prompts after 5 pm and on weekends Office Phone: [phone] Office Fax: [phone]

## 2017-02-16 DIAGNOSIS — E538 Deficiency of other specified B group vitamins: Secondary | ICD-10-CM | POA: Diagnosis not present

## 2017-02-16 DIAGNOSIS — I10 Essential (primary) hypertension: Secondary | ICD-10-CM | POA: Diagnosis not present

## 2017-02-16 DIAGNOSIS — N182 Chronic kidney disease, stage 2 (mild): Secondary | ICD-10-CM | POA: Diagnosis not present

## 2017-02-16 DIAGNOSIS — E249 Cushing's syndrome, unspecified: Secondary | ICD-10-CM | POA: Diagnosis not present

## 2017-02-16 DIAGNOSIS — D500 Iron deficiency anemia secondary to blood loss (chronic): Secondary | ICD-10-CM | POA: Diagnosis not present

## 2017-02-16 DIAGNOSIS — E871 Hypo-osmolality and hyponatremia: Secondary | ICD-10-CM | POA: Diagnosis not present

## 2017-02-16 DIAGNOSIS — D649 Anemia, unspecified: Secondary | ICD-10-CM | POA: Diagnosis not present

## 2017-02-16 LAB — VITAMIN B12: VITAMIN B 12: 1076

## 2017-02-16 NOTE — Addendum Note (Signed)
Addended byBlanchie Serve on: 02/16/2017 05:16 PM   Modules accepted: Level of Service

## 2017-02-20 ENCOUNTER — Other Ambulatory Visit: Payer: Self-pay | Admitting: *Deleted

## 2017-02-20 DIAGNOSIS — I10 Essential (primary) hypertension: Secondary | ICD-10-CM | POA: Diagnosis not present

## 2017-02-20 DIAGNOSIS — E871 Hypo-osmolality and hyponatremia: Secondary | ICD-10-CM | POA: Diagnosis not present

## 2017-02-20 LAB — BASIC METABOLIC PANEL
BUN: 30 — AB (ref 4–21)
CREATININE: 1.2 — AB (ref 0.5–1.1)
Glucose: 92
POTASSIUM: 4.1 (ref 3.4–5.3)
SODIUM: 128 — AB (ref 137–147)

## 2017-02-20 LAB — HEPATIC FUNCTION PANEL
ALK PHOS: 46 (ref 25–125)
ALT: 12 (ref 7–35)
AST: 15 (ref 13–35)
Bilirubin, Total: 0.8

## 2017-02-22 ENCOUNTER — Other Ambulatory Visit: Payer: Self-pay | Admitting: *Deleted

## 2017-02-22 MED ORDER — OXYCODONE-ACETAMINOPHEN 7.5-325 MG PO TABS: 1.0000 | ORAL_TABLET | Freq: Four times a day (QID) | ORAL | 0 refills | Status: DC

## 2017-02-23 ENCOUNTER — Encounter: Payer: Self-pay | Admitting: *Deleted

## 2017-02-23 NOTE — Progress Notes (Signed)
Opened in error

## 2017-02-24 ENCOUNTER — Non-Acute Institutional Stay: Payer: Medicare Other | Admitting: Family

## 2017-02-24 DIAGNOSIS — B372 Candidiasis of skin and nail: Secondary | ICD-10-CM

## 2017-02-24 DIAGNOSIS — B370 Candidal stomatitis: Secondary | ICD-10-CM

## 2017-02-24 NOTE — Progress Notes (Addendum)
Location:  [facility] Room Number: 35 Place of Service:  ALF [phone]) Provider: Tri Chittick FNP-C  Blanchie Serve, MD  Patient Care Team: Blanchie Serve, MD as PCP - General (Internal Medicine) Jaquelyne Firkus, Nelda Bucks, NP as Nurse Practitioner (Family Medicine)  Extended Emergency Contact Information Primary Emergency Contact: Fergeson,[name] Address: [phone] [place]          [place], [place] [address] [name] of [place] Phone: [phone] Mobile Phone: [phone] Relation: Spouse Secondary Emergency Contact: Judon,[name] Address: [address]          [place], [place] [address] [name] of [place] Mobile Phone: [phone] Relation: Son  Code Status:  DNR Goals of care: Advanced Directive information Advanced Directives 02/06/2017  Does Patient Have a Medical Advance Directive? Yes  Type of Paramedic of [place];Living will;Out of facility DNR (pink MOST or yellow form)  Does patient want to make changes to medical advance directive? No - Patient declined  Copy of [place] in Chart? Yes  Would patient like information on creating a medical advance directive? -  Pre-existing out of facility DNR order (yellow form or pink MOST form) Yellow form placed in chart (order not valid for inpatient use)     Chief Complaint  Patient presents with  . Acute Visit    oral thrush, abdominal fold redness     HPI:  Pt is a 83 y.o. female seen today at [facility] for an acute visit for evaluation of oral thrush and skin redness on abdominal fold. She is seen in her room today per facility Nurse request. Nurse reports patient has had a recurrence of redness on her upper abdominal skin fold and still has whitish patches on her tongue.Nurse states contacted Pharmacist recommend treating with Nizoral 200 mg Tablet daily x 7 days then increase to 400 mg Tablet x 4  Days if no changes. Patient states has  been soaking dentures with Nystatin daily at bedtime and brushing her tongue. She also states tongue not painful anymore except that she just " feel like something is there". Of note patient has been treated with Nystatin oral solution in 11/25/2016; 12/29/2016 and 02/06/2017. She was also treated with seven days course of Fluconazole 02/15/2017.No signs of immunosuppression WBC within normal range. I've discussed patient's persistent whitish patches with Dr. Bubba Camp who has also seen patient recently 02/15/2017 thinks whitish patches might be chronic condition for patient or related to dentures.also possible leukoplakia.will not treat with antifungal for now recommended waiting for evaluation by patient's dentist. Possible referral to ENT for evaluation for orapharyngeal candidiasis.        Past Medical History:  Diagnosis Date  . Acquired cyst of kidney    left  . Allergic rhinitis, cause unspecified   . Anemia, unspecified   . Chronic hyponatremia 09/05/2015  . CKD stage 2 due to type 2 diabetes mellitus ([place]) 11/18/2014  . Closed fracture of unspecified part of vertebral column without mention of spinal cord injury    T7 s/p kyphoplasty, T12  . Edema 2015  . Herpes simplex without mention of complication   . History of Epstein-Barr virus infection   . Hyposmolality and/or hyponatremia    07/13/15 Na 131  . Insomnia   . Insomnia, unspecified   . Left cavernous carotid aneurysm 08/2008   1-1/34mm aneurysm of cavernous carotid artery  . Malignant neoplasm of breast (female), unspecified site 1990   left. Had surgerey  and chemo, but no radiation  . 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 Neuropathy   . Osteoarthrosis of knee    bilaterally  . Osteoarthrosis, hip   . Osteoarthrosis, unspecified whether generalized or localized, forearm    bilaterally wrist  . Other and unspecified nonspecific immunological findings    positive ANA  . Other B-complex  deficiencies   . Pain in joint, site unspecified    severe, diffuse, chronic pain  . Positive ANA (antinuclear antibody)   . Pulmonary embolism ([place]) 05/07/2015  . Pure hypercholesterolemia   . Senile osteoporosis   . Unspecified essential hypertension   . Unspecified gastritis and gastroduodenitis without mention of hemorrhage   . Unspecified hypothyroidism   . Unspecified vitamin D deficiency    Past Surgical History:  Procedure Laterality Date  . DILATION AND CURETTAGE OF UTERUS  X 2  . FEMUR IM NAIL Right 03/18/2015   Procedure: INTRAMEDULLARY (IM) NAIL FEMORAL;  Surgeon: Rod Can, MD;  Location: [facility] ORS;  Service: Orthopedics;  Laterality: Right;  . FRACTURE SURGERY    . KYPHOPLASTY  07/03/2010   T12  . KYPHOPLASTY     C7  . LAPAROSCOPIC CHOLECYSTECTOMY  09/20/2006  . MASTECTOMY Left 04/29/1989  . MIDDLE EAR SURGERY Bilateral 1938  . NASAL SINUS SURGERY  1990 & 2000  . ORIF HIP FRACTURE Left 06/13/2007   following a syncopal episode  . TONSILLECTOMY  1968    Allergies  Allergen Reactions  . Aspirin     Drop in body temp with large quantities, can tolerate low doses of aspirin  . Penicillins Swelling    Has patient had a PCN reaction causing immediate rash, facial/tongue/throat swelling, SOB or lightheadedness with hypotension: No Has patient had a PCN reaction causing severe rash involving mucus membranes or skin necrosis: No Has patient had a PCN reaction that required hospitalization No Has patient had a PCN reaction occurring within the last 10 years: No If all of the above answers are "NO", then may proceed with Cephalosporin use.    Outpatient Encounter Prescriptions as of 02/24/2017  Medication Sig  . acetaminophen (TYLENOL) 325 MG tablet Take 650 mg by mouth every 4 (four) hours as needed.  Marland Kitchen alum & mag hydroxide-simeth (MINTOX) 161-096-04 MG/5ML suspension Take 30 mLs by mouth every 4 (four) hours as needed for indigestion or heartburn.  Marland Kitchen apixaban  (ELIQUIS) 2.5 MG TABS tablet Take 2.5 mg by mouth 2 (two) times daily.  Marland Kitchen atenolol (TENORMIN) 25 MG tablet Take 25 mg by mouth at bedtime.   . calcium citrate (CALCITRATE) 950 MG tablet Take 1 tablet (200 mg of elemental calcium total) by mouth daily.  . cholecalciferol (VITAMIN D) 1000 UNITS tablet Take 3,000 Units by mouth daily.  . Cyanocobalamin (VITAMIN B-12 CR PO) Take 1,000 mcg by mouth daily.   Marland Kitchen denosumab (PROLIA) 60 MG/ML SOLN injection Inject 60 mg into the skin every 6 (six) months. Administer in upper arm, thigh, or abdomen  . Dextromethorphan-Guaifenesin (ROBAFEN DM) 10-100 MG/5ML liquid Take 10 mLs by mouth every 6 (six) hours as needed.   . doxazosin (CARDURA) 4 MG tablet One daily to help control BP  . feeding supplement (BOOST / RESOURCE BREEZE) LIQD Take 1 Container by mouth 2 (two) times daily between meals.   . fexofenadine (ALLEGRA) 60 MG tablet Take 1 tablet (60 mg total) by mouth 2 (two) times daily.  . hydrALAZINE (APRESOLINE) 50 MG tablet Take  50 mg by mouth 2 (two) times daily. Hold for BP <90/60  . irbesartan (AVAPRO) 300 MG tablet Take 300 mg by mouth daily.  Marland Kitchen levothyroxine (SYNTHROID, LEVOTHROID) 150 MCG tablet Take 150 mcg by mouth daily before breakfast.  . Lidocaine (ASPERCREME LIDOCAINE) 4 % PTCH Apply 3 patches topically once. Apply 1 patch to the left hip, left knee, and the right shoulder in the morning. Remove in the evening.  . loperamide (IMODIUM A-D) 2 MG tablet Take 2 mg by mouth as needed for diarrhea or loose stools.  Marland Kitchen loratadine (CLARITIN) 10 MG tablet Take 10 mg by mouth daily. Reported on 09/22/2015  . Melatonin 3 MG TABS Take 1 tablet (3 mg total) by mouth at bedtime.  Marland Kitchen nystatin (MYCOSTATIN) 100000 UNIT/ML suspension Take 5 mLs by mouth 4 (four) times daily.  Marland Kitchen omeprazole (PRILOSEC) 10 MG capsule Take 1 capsule (10 mg total) by mouth daily.  Marland Kitchen oxyCODONE-acetaminophen (PERCOCET) 7.5-325 MG tablet Take 1 tablet by mouth 4 (four) times daily. Four  times daily scheduled and one at midnight as needed.  . polyethylene glycol powder (GLYCOLAX/MIRALAX) powder Take 17 grams in 6 oz water or juice daily to prevent constipation  . Psyllium (METAMUCIL SMOOTH TEXTURE) 28.3 % POWD One tablespoon in 6 oz wsater or juice daily to help prevent constipation  . senna-docusate (SENOKOT S) 8.6-50 MG tablet 2 tablets nightly to prevent constipation   No facility-administered encounter medications on file as of 02/24/2017.     Review of Systems  Constitutional: Negative for activity change, appetite change, chills, fatigue and fever.  HENT: Negative for congestion, ear pain, rhinorrhea, sinus pain, sinus pressure, sneezing, sore throat and trouble swallowing.        Wears upper and lower dentures. Whitish patch to mid tongue area has improved but not resolved.   Eyes: Negative for pain, discharge and redness.  Respiratory: Negative for cough, chest tightness, shortness of breath and wheezing.   Cardiovascular: Positive for leg swelling. Negative for chest pain and palpitations.  Gastrointestinal: Negative for abdominal distention, abdominal pain, constipation, diarrhea, nausea and vomiting.  Musculoskeletal: Positive for arthralgias and gait problem.  Skin: Negative for color change, pallor and rash.  Neurological: Negative for dizziness, seizures, syncope, light-headedness, numbness and headaches.  Psychiatric/Behavioral: Negative for agitation, confusion and sleep disturbance. The patient is not nervous/anxious.     Immunization History  Administered Date(s) Administered  . Influenza Split 02/13/2013  . Influenza-Unspecified 02/27/2014, 02/12/2015, 02/25/2016  . PPD Test 09/09/2015  . Pneumococcal Conjugate-13 05/05/2016  . Pneumococcal Polysaccharide-23 02/13/2013  . Td 05/05/2016  . Zoster 05/04/2016   Pertinent  Health Maintenance Due  Topic Date Due  . INFLUENZA VACCINE  12/14/2016  . MAMMOGRAM  09/09/2017 (Originally 03/21/1945)  . DEXA  SCAN  Completed  . PNA vac Low Risk Adult  Completed   Fall Risk  01/02/2017 11/10/2015 10/20/2015 10/20/2015 06/30/2015  Falls in the past year? No No Yes No No  Number falls in past yr: - - 2 or more - -  Injury with Fall? - - Yes - -  Risk Factor Category  - - - - -  Risk for fall due to : - - History of fall(s);Impaired balance/gait - -  Follow up - - - - -    Vitals:   02/24/17 1030  BP: (!) 168/88  Pulse: 60  Resp: 20  Temp: (!) 97.4 F (36.3 C)  SpO2: 97%  Weight: 140 lb (63.5 kg)  Height: 5\' 8"  (1.727  m)   Body mass index is 21.29 kg/m. Physical Exam  Constitutional: She is oriented to person, place, and time. She appears well-developed and well-nourished. No distress.  Elderly   HENT:  Head: Normocephalic.  Right Ear: External ear normal.  Left Ear: External ear normal.  Mouth/Throat: No oropharyngeal exudate.  Mid tongue whitish patch unable to scrape off with surrounding red areas. Buccal moist and clear.   Eyes: Pupils are equal, round, and reactive to light. Conjunctivae and EOM are normal. Right eye exhibits no discharge. Left eye exhibits no discharge. No scleral icterus.  Neck: Normal range of motion. No JVD present. No thyromegaly present.  Cardiovascular: Normal rate, regular rhythm, normal heart sounds and intact distal pulses.  Exam reveals no gallop and no friction rub.   No murmur heard. Pulmonary/Chest: Effort normal and breath sounds normal. No respiratory distress. She has no wheezes. She has no rales.  Abdominal: Soft. Bowel sounds are normal. She exhibits no distension. There is no tenderness. There is no rebound and no guarding.  Lymphadenopathy:    She has no cervical adenopathy.  Neurological: She is oriented to person, place, and time. Coordination normal.  Skin: Skin is warm and dry. No rash noted. No erythema. No pallor.   Upper Abdominal skin fold thin redness linear laceration area.   Psychiatric: She has a normal mood and affect.   Labs  reviewed:  Recent Labs  01/23/17 01/30/17 02/20/17  NA 129* 126* 128*  K 4.3 4.1 4.1  BUN 22* 23* 30*  CREATININE 1.1 1.0 1.2*    Recent Labs  09/19/16 01/02/17 02/20/17  AST 15 14 15   ALT 7 10 12   ALKPHOS 44 45 46    Recent Labs  05/05/16 06/23/16 09/19/16  WBC 4.4 5.6 4.4  HGB 11.7* 12.3 11.9*  HCT 35* 36 34*  PLT 158 159 156   Lab Results  Component Value Date   TSH 2.80 10/27/2016   No results found for: HGBA1C Lab Results  Component Value Date   CHOL 175 11/10/2014   HDL 53 11/10/2014   LDLCALC 87 11/10/2014   TRIG 173 (A) 11/10/2014    Significant Diagnostic Results in last 30 days:  No results found.  Assessment/Plan 1. Candidiasis of skin  Upper Abdominal skin fold thin redness linear laceration area.apply nystatin cream twice daily until resolved.   2. Oral candidiasis Previous whitish patches on tongue resolved except mid tongue area whitish patch unable to scrape off with some red clear areas especially to the right side of patch noted. has been treated with Nystatin oral solution in 11/25/2016; 12/29/2016 and 02/06/2017. She was also treated with seven days course of Fluconazole 02/15/2017.No signs of immunosuppression WBC within normal range. I've discussed patient's persistent whitish patches with Dr. Bubba Camp who has also seen patient recently 02/15/2017 thinks whitish patches might be chronic condition for patient or related to dentures.also possible leukoplakia.will not treat with antifungal for now recommended waiting for evaluation by patient's dentist. Possible referral to ENT for evaluation for orapharyngeal candidiasis.      Continue to monitor.Notify provider if worsening. Continue oral care and denture care.    Family/ staff Communication: Reviewed plan of care with patient and facility Nurse supervisor  Labs/tests ordered: None   Sandrea Hughs, NP

## 2017-03-02 LAB — VITAMIN D 25 HYDROXY (VIT D DEFICIENCY, FRACTURES): VIT D 25 HYDROXY: 58

## 2017-03-03 ENCOUNTER — Encounter: Payer: Self-pay | Admitting: *Deleted

## 2017-03-03 DIAGNOSIS — M810 Age-related osteoporosis without current pathological fracture: Secondary | ICD-10-CM | POA: Diagnosis not present

## 2017-03-03 LAB — HM DEXA SCAN

## 2017-03-06 ENCOUNTER — Non-Acute Institutional Stay: Payer: Medicare Other | Admitting: Internal Medicine

## 2017-03-06 ENCOUNTER — Encounter: Payer: Self-pay | Admitting: Internal Medicine

## 2017-03-06 VITALS — BP 132/74 | HR 60 | Temp 97.6°F | Resp 18 | Ht 68.0 in | Wt 143.2 lb

## 2017-03-06 DIAGNOSIS — E871 Hypo-osmolality and hyponatremia: Secondary | ICD-10-CM

## 2017-03-06 DIAGNOSIS — E538 Deficiency of other specified B group vitamins: Secondary | ICD-10-CM

## 2017-03-06 DIAGNOSIS — I10 Essential (primary) hypertension: Secondary | ICD-10-CM | POA: Diagnosis not present

## 2017-03-06 DIAGNOSIS — K148 Other diseases of tongue: Secondary | ICD-10-CM | POA: Diagnosis not present

## 2017-03-06 LAB — BASIC METABOLIC PANEL
BUN: 22 — AB (ref 4–21)
Creatinine: 1.1 (ref 0.5–1.1)
GLUCOSE: 91
Potassium: 4.4 (ref 3.4–5.3)
Sodium: 128 — AB (ref 137–147)

## 2017-03-06 MED ORDER — SODIUM CHLORIDE 1 G PO TABS: ORAL_TABLET | ORAL | 0 refills | Status: DC

## 2017-03-06 MED ORDER — VITAMIN B-12 100 MCG PO TABS: 100.0000 ug | ORAL_TABLET | Freq: Every day | ORAL | 0 refills | Status: DC

## 2017-03-06 NOTE — Progress Notes (Signed)
[facility]  Provider: Blanchie Serve MD   Location:  [place] of Service:  Clinic (12)  PCP: Blanchie Serve, MD Patient Care Team: Blanchie Serve, MD as PCP - General (Internal Medicine) Ngetich, Nelda Bucks, NP as Nurse Practitioner (Family Medicine)  Extended Emergency Contact Information Primary Emergency Contact: Gruwell,[name] Address: [address] [place]          [place], [place] [address] [name] of [place] Phone: [phone] Mobile Phone: [phone] Relation: Spouse Secondary Emergency Contact: Lieurance,[name] Address: [address]          [place], [place] [address] [name] of [place] Phone: [phone] Relation: Son  Goals of Care: Advanced Directive information Advanced Directives 02/06/2017  Does Patient Have a Medical Advance Directive? Yes  Type of Paramedic of [place];Living will;Out of facility DNR (pink MOST or yellow form)  Does patient want to make changes to medical advance directive? No - Patient declined  Copy of [place] in Chart? Yes  Would patient like information on creating a medical advance directive? -  Pre-existing out of facility DNR order (yellow form or pink MOST form) Yellow form placed in chart (order not valid for inpatient use)    Chief Complaint  Patient presents with  . Acute Visit    follow up on low sodium, white patch to tongue, BP  . Medication Refill    No refills needed at this time.     HPI: Patient is a 83 y.o. female seen today for follow up visit. Her son present during the visit.   Hypertension- few elevated BP readings in ALF. Currently on hydralazine 50 mg bid, irbesartan 300 mg daily, atenolol 25 mg qhs, doxazsoin 4 mg daily.  White patch- to her tongue, recurrent, has been on several rounds of nystatin and even fluconazole at times without complete resolution of patch. Denies any soreness to mouth. No  trouble swallowing. Normal wbc count. Has new well fitting dentures now.   b12 def- on b12 supplement. Reviewed recent b12 level.    Past Medical History:  Diagnosis Date  . Acquired cyst of kidney    left  . Allergic rhinitis, cause unspecified   . Anemia, unspecified   . Chronic hyponatremia 09/05/2015  . CKD stage 2 due to type 2 diabetes mellitus ([place]) 11/18/2014  . Closed fracture of unspecified part of vertebral column without mention of spinal cord injury    T7 s/p kyphoplasty, T12  . Edema 2015  . Herpes simplex without mention of complication   . History of Epstein-Barr virus infection   . Hyposmolality and/or hyponatremia    07/13/15 Na 131  . Insomnia   . Insomnia, unspecified   . Left cavernous carotid aneurysm 08/2008   1-1/60mm aneurysm of cavernous carotid artery  . Malignant neoplasm of breast (female), unspecified site 1990   left. Had surgerey and chemo, but no radiation  . 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 Neuropathy   . Osteoarthrosis of knee    bilaterally  . Osteoarthrosis, hip   . Osteoarthrosis, unspecified whether generalized or localized, forearm    bilaterally wrist  . Other and unspecified nonspecific immunological findings    positive ANA  . Other B-complex deficiencies   . Pain in joint, site unspecified    severe, diffuse, chronic pain  . Positive ANA (antinuclear antibody)   .  Pulmonary embolism ([place]) 05/07/2015  . Pure hypercholesterolemia   . Senile osteoporosis   . Unspecified essential hypertension   . Unspecified gastritis and gastroduodenitis without mention of hemorrhage   . Unspecified hypothyroidism   . Unspecified vitamin D deficiency    Past Surgical History:  Procedure Laterality Date  . DILATION AND CURETTAGE OF UTERUS  X 2  . FEMUR IM NAIL Right 03/18/2015   Procedure: INTRAMEDULLARY (IM) NAIL FEMORAL;  Surgeon: Rod Can, MD;  Location: [facility];  Service: Orthopedics;   Laterality: Right;  . FRACTURE SURGERY    . KYPHOPLASTY  07/03/2010   T12  . KYPHOPLASTY     C7  . LAPAROSCOPIC CHOLECYSTECTOMY  09/20/2006  . MASTECTOMY Left 04/29/1989  . MIDDLE EAR SURGERY Bilateral 1938  . NASAL SINUS SURGERY  1990 & 2000  . ORIF HIP FRACTURE Left 06/13/2007   following a syncopal episode  . TONSILLECTOMY  1968    reports that she has never smoked. She has never used smokeless tobacco. She reports that she drinks alcohol. She reports that she does not use drugs. Social History   Social History  . Marital status: Married    Spouse name: N/A  . Number of children: N/A  . Years of education: N/A   Occupational History  . retired Marine scientist    Social History Main Topics  . Smoking status: Never Smoker  . Smokeless tobacco: Never Used  . Alcohol use Yes     Comment: 05/08/2015 "I'll have a glass of white wine q now and then"  . Drug use: No  . Sexual activity: No   Other Topics Concern  . Not on file   Social History Narrative   Lives at [facility] since 10/16/2013, moved from [place]    Married 1968   Never smoked   No alcohol    Exercise walking, walks with cane   POA/Living Will                 History reviewed. No pertinent family history.  Health Maintenance  Topic Date Due  . INFLUENZA VACCINE  12/14/2016  . MAMMOGRAM  09/09/2017 (Originally 03/21/1945)  . TETANUS/TDAP  05/05/2026  . DEXA SCAN  Completed  . PNA vac Low Risk Adult  Completed    Allergies  Allergen Reactions  . Aspirin     Drop in body temp with large quantities, can tolerate low doses of aspirin  . Penicillins Swelling    Has patient had a PCN reaction causing immediate rash, facial/tongue/throat swelling, SOB or lightheadedness with hypotension: No Has patient had a PCN reaction causing severe rash involving mucus membranes or skin necrosis: No Has patient had a PCN reaction that required hospitalization No Has patient had a PCN reaction occurring within the last 10 years:  No If all of the above answers are "NO", then may proceed with Cephalosporin use.    Outpatient Encounter Prescriptions as of 03/06/2017  Medication Sig  . acetaminophen (TYLENOL) 325 MG tablet Take 650 mg by mouth every 4 (four) hours as needed.  Marland Kitchen alum & mag hydroxide-simeth (MINTOX) 629-528-41 MG/5ML suspension Take 30 mLs by mouth every 4 (four) hours as needed for indigestion or heartburn.  Marland Kitchen apixaban (ELIQUIS) 2.5 MG TABS tablet Take 2.5 mg by mouth 2 (two) times daily.  Marland Kitchen atenolol (TENORMIN) 25 MG tablet Take 25 mg by mouth at bedtime.   . calcium citrate (CALCITRATE) 950 MG tablet Take 1 tablet (200 mg of elemental calcium total) by mouth  daily.  . cholecalciferol (VITAMIN D) 1000 UNITS tablet Take 3,000 Units by mouth daily.  Marland Kitchen denosumab (PROLIA) 60 MG/ML SOLN injection Inject 60 mg into the skin every 6 (six) months. Administer in upper arm, thigh, or abdomen  . Dextromethorphan-Guaifenesin (ROBAFEN DM) 10-100 MG/5ML liquid Take 10 mLs by mouth every 6 (six) hours as needed.   . doxazosin (CARDURA) 4 MG tablet One daily to help control BP  . feeding supplement (BOOST / RESOURCE BREEZE) LIQD Take 1 Container by mouth 2 (two) times daily between meals.   . fexofenadine (ALLEGRA) 60 MG tablet Take 1 tablet (60 mg total) by mouth 2 (two) times daily.  . hydrALAZINE (APRESOLINE) 50 MG tablet Take 50 mg by mouth 3 (three) times daily. Hold for SBP <110  . irbesartan (AVAPRO) 300 MG tablet Take 300 mg by mouth daily.  Marland Kitchen levothyroxine (SYNTHROID, LEVOTHROID) 150 MCG tablet Take 150 mcg by mouth daily before breakfast.  . Lidocaine (ASPERCREME LIDOCAINE) 4 % PTCH Apply 3 patches topically once. Apply 1 patch to the left hip, left knee, and the right shoulder in the morning. Remove in the evening.  . loperamide (IMODIUM A-D) 2 MG tablet Take 2 mg by mouth as needed for diarrhea or loose stools.  Marland Kitchen loratadine (CLARITIN) 10 MG tablet Take 10 mg by mouth daily. Reported on 09/22/2015  . Melatonin 3  MG TABS Take 1 tablet (3 mg total) by mouth at bedtime.  Marland Kitchen nystatin (MYCOSTATIN) 100000 UNIT/ML suspension Take 5 mLs by mouth 4 (four) times daily.  Marland Kitchen omeprazole (PRILOSEC) 10 MG capsule Take 1 capsule (10 mg total) by mouth daily.  Marland Kitchen oxyCODONE-acetaminophen (PERCOCET) 7.5-325 MG tablet Take 1 tablet by mouth 4 (four) times daily. Four times daily scheduled and one at midnight as needed.  . polyethylene glycol powder (GLYCOLAX/MIRALAX) powder Take 17 grams in 6 oz water or juice daily to prevent constipation  . Psyllium (METAMUCIL SMOOTH TEXTURE) 28.3 % POWD One tablespoon in 6 oz wsater or juice daily to help prevent constipation  . senna-docusate (SENOKOT S) 8.6-50 MG tablet 2 tablets nightly to prevent constipation  . [DISCONTINUED] Cyanocobalamin (VITAMIN B-12 CR PO) Take 1,000 mcg by mouth daily.   . sodium chloride 1 g tablet 1 tablet daily for hyponatremia  . vitamin B-12 (CYANOCOBALAMIN) 100 MCG tablet Take 1 tablet (100 mcg total) by mouth daily.   No facility-administered encounter medications on file as of 03/06/2017.     Review of Systems  Constitutional: Negative for appetite change, fatigue and fever.  HENT: Positive for congestion and hearing loss. Negative for ear discharge, ear pain, mouth sores, sinus pain, sinus pressure, sore throat and trouble swallowing.   Eyes: Negative for visual disturbance.  Respiratory: Negative for cough and shortness of breath.   Cardiovascular: Positive for leg swelling. Negative for chest pain and palpitations.       Improved foot swelling.   Gastrointestinal: Positive for constipation. Negative for abdominal pain, diarrhea, nausea and vomiting.  Endocrine: Negative for polydipsia and polyuria.  Genitourinary: Negative for dysuria, frequency and pelvic pain.  Musculoskeletal: Positive for arthralgias, back pain and gait problem.       Uses a walker  Skin: Negative for rash.       Itching to skin on her nose for few days. Has been using new  moisturizer  Neurological: Negative for dizziness, tremors and headaches.  Hematological: Bruises/bleeds easily.  Psychiatric/Behavioral: Negative for behavioral problems and confusion. The patient is not nervous/anxious.  Vitals:   03/06/17 1546  BP: 132/74  Pulse: 60  Resp: 18  Temp: 97.6 F (36.4 C)  TempSrc: Oral  SpO2: 99%  Weight: 143 lb 3.2 oz (65 kg)  Height: 5\' 8"  (1.727 m)   Body mass index is 21.77 kg/m.   Wt Readings from Last 3 Encounters:  03/06/17 143 lb 3.2 oz (65 kg)  02/24/17 140 lb (63.5 kg)  02/15/17 140 lb (63.5 kg)   Physical Exam  Constitutional: She is oriented to person, place, and time. She appears well-developed and well-nourished. No distress.  HENT:  Head: Normocephalic and atraumatic.  Mouth/Throat: Oropharynx is clear and moist. No oropharyngeal exudate.  Has dentures, on removal of dentures, gums look healthy. No open sores to her mouth. White patches to tongue noted, no bleed of surface on scraping.  Eyes: Conjunctivae are normal. Right eye exhibits no discharge. Left eye exhibits no discharge.  Neck: Normal range of motion. Neck supple.  Cardiovascular: Normal rate and regular rhythm.   Pulmonary/Chest: Effort normal and breath sounds normal.  Abdominal: Soft. Bowel sounds are normal. She exhibits no distension. There is no rebound and no guarding.  Musculoskeletal: She exhibits edema.  Can move all 4 extremities, arthritis changes to joints, trace edema to feet  Lymphadenopathy:    She has no cervical adenopathy.  Neurological: She is alert and oriented to person, place, and time.  Skin: Skin is warm and dry. She is not diaphoretic.  Psychiatric: She has a normal mood and affect.    Labs reviewed: Basic Metabolic Panel:  Recent Labs  01/30/17 02/13/17 02/20/17  NA 126* 125* 128*  K 4.1 4.3 4.1  BUN 23* 27* 30*  CREATININE 1.0 1.2* 1.2*   Liver Function Tests:  Recent Labs  09/19/16 01/02/17 02/20/17  AST 15 14 15   ALT  7 10 12   ALKPHOS 44 45 46   No results for input(s): LIPASE, AMYLASE in the last 8760 hours. No results for input(s): AMMONIA in the last 8760 hours. CBC:  Recent Labs  05/05/16 06/23/16 09/19/16  WBC 4.4 5.6 4.4  HGB 11.7* 12.3 11.9*  HCT 35* 36 34*  PLT 158 159 156   Cardiac Enzymes: No results for input(s): CKTOTAL, CKMB, CKMBINDEX, TROPONINI in the last 8760 hours. BNP: Invalid input(s): POCBNP No results found for: HGBA1C Lab Results  Component Value Date   TSH 2.80 10/27/2016   Lab Results  Component Value Date   VITAMINB12 1,076 02/16/2017   Lab Results  Component Value Date   FOLATE 23.4 09/05/2015   Lab Results  Component Value Date   IRON 75 09/05/2015   TIBC 265 09/05/2015   FERRITIN 124 09/05/2015   02/16/17 urine osmolarity 233, urine sodium 37, serum osmolarity 271, cortisol 18.7   Lipid Panel: No results for input(s): CHOL, HDL, LDLCALC, TRIG, CHOLHDL, LDLDIRECT in the last 8760 hours. No results found for: HGBA1C  Procedures since last visit: No results found.  Assessment/Plan  Hypertension few elevated BP readings in ALF on review. Increase hydralazine to 50 mg tid. Continue irbesartan 300 mg daily and atenolol 25 mg qhs with doxazosin 4 mg daily. Monitor BP daily.   White patch to tongue Persistent, no signs of immuno supression. Concern for geographic tongue vs leukoplakia. ENT referral  Chronic Hyponatremia aao x 3. Reviewed med list, not on amy that can precipitate hyponatremia. No signs of fluid loss. Low serum osmolality and normal urine osmolality and sodium level. Has ckd and this likely is causing it.  Start 1 g salt tablet daily. Check bmp in 1 week.   b12 deficiency Elevated b12 level. Decrease b12 from 1000 mcg daily to 100 mcg daily.   Labs/tests ordered:  Bmp in 1 week.   Next appointment: 3 months  Communication: reviewed care plan with patient, her son and charge nurse.    Blanchie Serve, MD Internal  Medicine [facility] [address] [place], [place] [address] Cell Phone (Monday-Friday 8 am - 5 pm): [phone] On Call: [phone] and follow prompts after 5 pm and on weekends Office Phone: [phone] Office Fax: [phone]

## 2017-03-10 ENCOUNTER — Encounter: Payer: Self-pay | Admitting: Family

## 2017-03-10 ENCOUNTER — Non-Acute Institutional Stay: Payer: Medicare Other | Admitting: Family

## 2017-03-10 DIAGNOSIS — B001 Herpesviral vesicular dermatitis: Secondary | ICD-10-CM

## 2017-03-10 MED ORDER — DOCOSANOL 10 % EX CREA: 1.0000 "application " | TOPICAL_CREAM | Freq: Four times a day (QID) | CUTANEOUS | 0 refills | Status: AC

## 2017-03-10 NOTE — Progress Notes (Signed)
Location:  [place] Room Number: 35 Place of Service:  ALF [phone]) Provider: Garrit Marrow FNP-C  Blanchie Serve, MD  Patient Care Team: Blanchie Serve, MD as PCP - General (Internal Medicine) Kaetlyn Noa, Nelda Bucks, NP as Nurse Practitioner (Family Medicine)  Extended Emergency Contact Information Primary Emergency Contact: Bagheri,[name] Address: [phone] [place]          [place], [place] [address] [name] of [place] Phone: [phone] Mobile Phone: [phone] Relation: Spouse Secondary Emergency Contact: Cardosa,[name] Address: [address]          [place], [place] [address] [name] of [place] Mobile Phone: [phone] Relation: Son  Code Status:  DNR Goals of care: Advanced Directive information Advanced Directives 03/10/2017  Does Patient Have a Medical Advance Directive? Yes  Type of Advance Directive Out of facility DNR (pink MOST or yellow form);Living will;Healthcare Power of Attorney  Does patient want to make changes to medical advance directive? -  Copy of [place] in Chart? Yes  Would patient like information on creating a medical advance directive? -  Pre-existing out of facility DNR order (yellow form or pink MOST form) Yellow form placed in chart (order not valid for inpatient use)     Chief Complaint  Patient presents with  . Acute Visit    cold sore and flu-like symptoms (flu vaccine given 2 days ago)    HPI:  Pt is a 83 y.o. female seen today at [facility] for an acute visit for evaluation of cold sore. She is seen in her room today per facility Nurse request. She states has had cold sore x 1 day. She also reports was not feeling well yesterday but feels much better today. She had aches and runny nose. She denies any fever or chills. Of note she had a flu shot two days ago.   Past Medical History:  Diagnosis Date  . Acquired cyst of kidney    left  . Allergic rhinitis, cause  unspecified   . Anemia, unspecified   . Chronic hyponatremia 09/05/2015  . CKD stage 2 due to type 2 diabetes mellitus ([place]) 11/18/2014  . Closed fracture of unspecified part of vertebral column without mention of spinal cord injury    T7 s/p kyphoplasty, T12  . Edema 2015  . Herpes simplex without mention of complication   . History of Epstein-Barr virus infection   . Hyposmolality and/or hyponatremia    07/13/15 Na 131  . Insomnia   . Insomnia, unspecified   . Left cavernous carotid aneurysm 08/2008   1-1/73mm aneurysm of cavernous carotid artery  . Malignant neoplasm of breast (female), unspecified site 1990   left. Had surgerey and chemo, but no radiation  . 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 Neuropathy   . Osteoarthrosis of knee    bilaterally  . Osteoarthrosis, hip   . Osteoarthrosis, unspecified whether generalized or localized, forearm    bilaterally wrist  . Other and unspecified nonspecific immunological findings    positive ANA  . Other B-complex deficiencies   . Pain in joint, site unspecified    severe, diffuse, chronic pain  . Positive ANA (antinuclear antibody)   . Pulmonary embolism ([place]) 05/07/2015  . Pure hypercholesterolemia   . Senile osteoporosis   . Unspecified essential hypertension   . Unspecified gastritis and gastroduodenitis without mention of hemorrhage   . Unspecified hypothyroidism   .  Unspecified vitamin D deficiency    Past Surgical History:  Procedure Laterality Date  . DILATION AND CURETTAGE OF UTERUS  X 2  . FEMUR IM NAIL Right 03/18/2015   Procedure: INTRAMEDULLARY (IM) NAIL FEMORAL;  Surgeon: Rod Can, MD;  Location: [facility];  Service: Orthopedics;  Laterality: Right;  . FRACTURE SURGERY    . KYPHOPLASTY  07/03/2010   T12  . KYPHOPLASTY     C7  . LAPAROSCOPIC CHOLECYSTECTOMY  09/20/2006  . MASTECTOMY Left 04/29/1989  . MIDDLE EAR SURGERY Bilateral 1938  . NASAL SINUS SURGERY  1990 &  2000  . ORIF HIP FRACTURE Left 06/13/2007   following a syncopal episode  . TONSILLECTOMY  1968    Allergies  Allergen Reactions  . Aspirin     Drop in body temp with large quantities, can tolerate low doses of aspirin  . Penicillins Swelling    Has patient had a PCN reaction causing immediate rash, facial/tongue/throat swelling, SOB or lightheadedness with hypotension: No Has patient had a PCN reaction causing severe rash involving mucus membranes or skin necrosis: No Has patient had a PCN reaction that required hospitalization No Has patient had a PCN reaction occurring within the last 10 years: No If all of the above answers are "NO", then may proceed with Cephalosporin use.    Outpatient Encounter Prescriptions as of 03/10/2017  Medication Sig  . acetaminophen (TYLENOL) 325 MG tablet Take 650 mg by mouth every 4 (four) hours as needed.  Marland Kitchen alum & mag hydroxide-simeth (MINTOX) 009-381-82 MG/5ML suspension Take 30 mLs by mouth every 4 (four) hours as needed for indigestion or heartburn.  Marland Kitchen apixaban (ELIQUIS) 2.5 MG TABS tablet Take 2.5 mg by mouth 2 (two) times daily.  Marland Kitchen atenolol (TENORMIN) 25 MG tablet Take 25 mg by mouth at bedtime.   . calcium citrate (CALCITRATE) 950 MG tablet Take 1 tablet (200 mg of elemental calcium total) by mouth daily.  . cholecalciferol (VITAMIN D) 1000 UNITS tablet Take 3,000 Units by mouth daily.  Marland Kitchen denosumab (PROLIA) 60 MG/ML SOLN injection Inject 60 mg into the skin every 6 (six) months. Administer in upper arm, thigh, or abdomen  . Dextromethorphan-Guaifenesin (ROBAFEN DM) 10-100 MG/5ML liquid Take 10 mLs by mouth every 6 (six) hours as needed.   . doxazosin (CARDURA) 4 MG tablet One daily to help control BP  . feeding supplement (BOOST / RESOURCE BREEZE) LIQD Take 1 Container by mouth 2 (two) times daily between meals.   . fexofenadine (ALLEGRA) 60 MG tablet Take 1 tablet (60 mg total) by mouth 2 (two) times daily.  . hydrALAZINE (APRESOLINE) 50 MG  tablet Take 50 mg by mouth 3 (three) times daily. Hold for SBP <110  . irbesartan (AVAPRO) 300 MG tablet Take 300 mg by mouth daily.  Marland Kitchen levothyroxine (SYNTHROID, LEVOTHROID) 150 MCG tablet Take 150 mcg by mouth daily before breakfast.  . Lidocaine (ASPERCREME LIDOCAINE) 4 % PTCH Apply 3 patches topically once. Apply 1 patch to the left hip, left knee, and the right shoulder in the morning. Remove in the evening.  . loperamide (IMODIUM A-D) 2 MG tablet Take 2 mg by mouth as needed for diarrhea or loose stools.  . Melatonin 3 MG TABS Take 1 tablet (3 mg total) by mouth at bedtime.  Marland Kitchen nystatin (MYCOSTATIN) 100000 UNIT/ML suspension Take 5 mLs by mouth 4 (four) times daily.  Marland Kitchen omeprazole (PRILOSEC) 10 MG capsule Take 1 capsule (10 mg total) by mouth daily.  Marland Kitchen oxyCODONE-acetaminophen (PERCOCET) 7.5-325  MG tablet Take 1 tablet by mouth 4 (four) times daily. Four times daily scheduled and one at midnight as needed.  . polyethylene glycol powder (GLYCOLAX/MIRALAX) powder Take 17 grams in 6 oz water or juice daily to prevent constipation  . Psyllium (METAMUCIL SMOOTH TEXTURE) 28.3 % POWD One tablespoon in 6 oz wsater or juice daily to help prevent constipation  . senna-docusate (SENOKOT S) 8.6-50 MG tablet 2 tablets nightly to prevent constipation  . sodium chloride 1 g tablet 1 tablet daily for hyponatremia  . vitamin B-12 (CYANOCOBALAMIN) 100 MCG tablet Take 1 tablet (100 mcg total) by mouth daily.  . Docosanol (ABREVA) 10 % CREA Apply 1 application topically 4 (four) times daily.  . [DISCONTINUED] loratadine (CLARITIN) 10 MG tablet Take 10 mg by mouth daily. Reported on 09/22/2015   No facility-administered encounter medications on file as of 03/10/2017.     Review of Systems  Constitutional: Negative for activity change, appetite change, chills, fatigue and fever.  HENT: Negative for congestion, ear pain, postnasal drip, rhinorrhea, sinus pressure, sinus pain, sneezing and sore throat.   Eyes:  Negative for redness and itching.  Respiratory: Negative for cough, chest tightness, shortness of breath and wheezing.   Cardiovascular: Negative for chest pain, palpitations and leg swelling.  Gastrointestinal: Negative for abdominal distention, abdominal pain, constipation, diarrhea, nausea and vomiting.  Genitourinary: Negative for dysuria, flank pain, frequency and urgency.  Musculoskeletal: Positive for gait problem.       Chronic arthritic pain   Skin: Negative for color change, pallor, rash and wound.  Neurological: Negative for dizziness, syncope, weakness, light-headedness, numbness and headaches.  Psychiatric/Behavioral: Negative for agitation, confusion and sleep disturbance. The patient is not nervous/anxious.     Immunization History  Administered Date(s) Administered  . Influenza Split 02/13/2013  . Influenza-Unspecified 02/27/2014, 02/12/2015, 02/25/2016, 03/08/2017  . PPD Test 09/09/2015  . Pneumococcal Conjugate-13 05/05/2016  . Pneumococcal Polysaccharide-23 02/13/2013  . Td 05/05/2016  . Zoster 05/04/2016   Pertinent  Health Maintenance Due  Topic Date Due  . MAMMOGRAM  09/09/2017 (Originally 03/21/1945)  . INFLUENZA VACCINE  Completed  . DEXA SCAN  Completed  . PNA vac Low Risk Adult  Completed   Fall Risk  01/02/2017 11/10/2015 10/20/2015 10/20/2015 06/30/2015  Falls in the past year? No No Yes No No  Number falls in past yr: - - 2 or more - -  Injury with Fall? - - Yes - -  Risk Factor Category  - - - - -  Risk for fall due to : - - History of fall(s);Impaired balance/gait - -  Follow up - - - - -    Vitals:   03/10/17 1001  BP: (!) 166/86  Pulse: (!) 55  Resp: 18  Temp: 98.6 F (37 C)  Weight: 141 lb 6.4 oz (64.1 kg)  Height: 5\' 8"  (1.727 m)   Body mass index is 21.5 kg/m. Physical Exam  Constitutional: She is oriented to person, place, and time. She appears well-developed and well-nourished. No distress.  Elderly  HENT:  Head: Normocephalic.    Right Ear: External ear normal.  Left Ear: External ear normal.  Mouth/Throat: Oropharynx is clear and moist. No oropharyngeal exudate.  Eyes: Conjunctivae and EOM are normal. Pupils are equal, round, and reactive to light. Right eye exhibits no discharge. Left eye exhibits no discharge. No scleral icterus.  Neck: Normal range of motion. No JVD present. No thyromegaly present.  Cardiovascular: Normal rate, regular rhythm, normal heart sounds and intact  distal pulses. Exam reveals no gallop and no friction rub.  No murmur heard. Pulmonary/Chest: Effort normal and breath sounds normal. No respiratory distress. She has no wheezes. She has no rales.  Abdominal: Soft. Bowel sounds are normal. She exhibits no distension. There is no tenderness. There is no rebound and no guarding.  Genitourinary:  Genitourinary Comments: Continent   Musculoskeletal: She exhibits no edema or tenderness.  Moves x 4 extremities. Unsteady gait uses FWW  Lymphadenopathy:    She has no cervical adenopathy.  Neurological: She is oriented to person, place, and time. Coordination normal.  Skin: Skin is warm and dry. No rash noted. No erythema.  Mid upper lip cold sore without any signs of infections.   Psychiatric: She has a normal mood and affect.   Labs reviewed:  Recent Labs  01/30/17 02/13/17 02/20/17  NA 126* 125* 128*  K 4.1 4.3 4.1  BUN 23* 27* 30*  CREATININE 1.0 1.2* 1.2*    Recent Labs  09/19/16 01/02/17 02/20/17  AST 15 14 15   ALT 7 10 12   ALKPHOS 44 45 46    Recent Labs  05/05/16 06/23/16 09/19/16  WBC 4.4 5.6 4.4  HGB 11.7* 12.3 11.9*  HCT 35* 36 34*  PLT 158 159 156   Lab Results  Component Value Date   TSH 2.80 10/27/2016   No results found for: HGBA1C Lab Results  Component Value Date   CHOL 175 11/10/2014   HDL 53 11/10/2014   LDLCALC 87 11/10/2014   TRIG 173 (A) 11/10/2014    Significant Diagnostic Results in last 30 days:  No results found.  Assessment/Plan  Cold  sore Afebrile.Status post flu shot 2 days ago.Flu like symptoms attributed to recent flu shot expected symptoms.apply Abreva 10% topical cream to affected areas on upper lip every 6 hours x 7 days.Continue to monitor.  Family/ staff Communication: Reviewed plan of care with patient and facility Nurse.   Labs/tests ordered: None   Bryonna Sundby C Johnanthony Wilden, NP

## 2017-03-11 ENCOUNTER — Encounter: Payer: Self-pay | Admitting: Internal Medicine

## 2017-03-13 DIAGNOSIS — I10 Essential (primary) hypertension: Secondary | ICD-10-CM | POA: Diagnosis not present

## 2017-03-13 DIAGNOSIS — E871 Hypo-osmolality and hyponatremia: Secondary | ICD-10-CM | POA: Diagnosis not present

## 2017-03-13 LAB — BASIC METABOLIC PANEL
BUN: 21 (ref 4–21)
Creatinine: 1.1 (ref 0.5–1.1)
Glucose: 95
Potassium: 4.3 (ref 3.4–5.3)
Sodium: 127 — AB (ref 137–147)

## 2017-03-13 LAB — HM DEXA SCAN

## 2017-03-13 NOTE — Telephone Encounter (Signed)
Patient's Dexa scan results discussed with patient's son Ozzie Hoyle. Patient's son request to be notified previous Dexa scan results to compare with current results. Dexa scan results from 2012 printed and given to facility Nurse supervisor to notify patient's POA.

## 2017-03-20 ENCOUNTER — Encounter: Payer: Self-pay | Admitting: Internal Medicine

## 2017-03-20 ENCOUNTER — Non-Acute Institutional Stay: Payer: Medicare Other | Admitting: Internal Medicine

## 2017-03-20 DIAGNOSIS — L853 Xerosis cutis: Secondary | ICD-10-CM

## 2017-03-20 DIAGNOSIS — K1379 Other lesions of oral mucosa: Secondary | ICD-10-CM

## 2017-03-20 DIAGNOSIS — K143 Hypertrophy of tongue papillae: Secondary | ICD-10-CM | POA: Diagnosis not present

## 2017-03-20 DIAGNOSIS — E871 Hypo-osmolality and hyponatremia: Secondary | ICD-10-CM | POA: Diagnosis not present

## 2017-03-20 NOTE — Progress Notes (Signed)
Location:  [place] Room Number: 35 Place of Service:  ALF [phone]) Provider:  Blanchie Serve, MD  Blanchie Serve, MD  Patient Care Team: Blanchie Serve, MD as PCP - General (Internal Medicine) Ngetich, Nelda Bucks, NP as Nurse Practitioner (Family Medicine)  Extended Emergency Contact Information Primary Emergency Contact: Hennessee,[name] Address: [phone] [place]          [place], [place] [address] [name] of [place] Phone: [phone] Mobile Phone: [phone] Relation: Spouse Secondary Emergency Contact: Deckard,[name] Address: [address]          [place], [place] [address] [name] of [place] Mobile Phone: [phone] Relation: Son  Code Status:  DNR  Goals of care: Advanced Directive information Advanced Directives 03/20/2017  Does Patient Have a Medical Advance Directive? Yes  Type of Advance Directive Out of facility DNR (pink MOST or yellow form);[place];Living will  Does patient want to make changes to medical advance directive? No - Patient declined  Copy of [place] in Chart? Yes  Would patient like information on creating a medical advance directive? -  Pre-existing out of facility DNR order (yellow form or pink MOST form) Yellow form placed in chart (order not valid for inpatient use)     Chief Complaint  Patient presents with  . Acute Visit    dry skin, mouth sore, low sodium, white patches to her tongue    HPI:  Pt is a 83 y.o. female seen today for an acute visit. She has itching to her nose bridge and face at times. Nurse has SBAR with complaints of mouth sore. Pt mentions having fever blister to her lip that has resolved. Denies any sore inside her mouth. Continues to soak dentures in nystatin solution. Reviewed recent na level.    Past Medical History:  Diagnosis Date  . Acquired cyst of kidney    left  . Allergic rhinitis, cause unspecified   . Anemia, unspecified    . Chronic hyponatremia 09/05/2015  . CKD stage 2 due to type 2 diabetes mellitus ([place]) 11/18/2014  . Closed fracture of unspecified part of vertebral column without mention of spinal cord injury    T7 s/p kyphoplasty, T12  . Edema 2015  . Herpes simplex without mention of complication   . History of Epstein-Barr virus infection   . Hyposmolality and/or hyponatremia    07/13/15 Na 131  . Insomnia   . Insomnia, unspecified   . Left cavernous carotid aneurysm 08/2008   1-1/61mm aneurysm of cavernous carotid artery  . Malignant neoplasm of breast (female), unspecified site 1990   left. Had surgerey and chemo, but no radiation  . 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 Neuropathy   . Osteoarthrosis of knee    bilaterally  . Osteoarthrosis, hip   . Osteoarthrosis, unspecified whether generalized or localized, forearm    bilaterally wrist  . Other and unspecified nonspecific immunological findings    positive ANA  . Other B-complex deficiencies   . Pain in joint, site unspecified    severe, diffuse, chronic pain  . Positive ANA (antinuclear antibody)   . Pulmonary embolism ([place]) 05/07/2015  . Pure hypercholesterolemia   . Senile osteoporosis   . Unspecified essential hypertension   . Unspecified gastritis and gastroduodenitis without mention of hemorrhage   . Unspecified hypothyroidism   . Unspecified vitamin D deficiency    Past Surgical History:  Procedure Laterality Date  . DILATION AND CURETTAGE OF UTERUS  X 2  . FRACTURE SURGERY    . KYPHOPLASTY  07/03/2010   T12  . KYPHOPLASTY     C7  . LAPAROSCOPIC CHOLECYSTECTOMY  09/20/2006  . MASTECTOMY Left 04/29/1989  . MIDDLE EAR SURGERY Bilateral 1938  . NASAL SINUS SURGERY  1990 & 2000  . ORIF HIP FRACTURE Left 06/13/2007   following a syncopal episode  . TONSILLECTOMY  1968    Allergies  Allergen Reactions  . Aspirin     Drop in body temp with large quantities, can tolerate low  doses of aspirin  . Penicillins Swelling    Has patient had a PCN reaction causing immediate rash, facial/tongue/throat swelling, SOB or lightheadedness with hypotension: No Has patient had a PCN reaction causing severe rash involving mucus membranes or skin necrosis: No Has patient had a PCN reaction that required hospitalization No Has patient had a PCN reaction occurring within the last 10 years: No If all of the above answers are "NO", then may proceed with Cephalosporin use.    Outpatient Encounter Medications as of 03/20/2017  Medication Sig  . acetaminophen (TYLENOL) 500 MG tablet Take 500 mg 2 (two) times daily by mouth.  Marland Kitchen alum & mag hydroxide-simeth (MINTOX) 093-267-12 MG/5ML suspension Take 30 mLs by mouth every 4 (four) hours as needed for indigestion or heartburn.  Marland Kitchen apixaban (ELIQUIS) 2.5 MG TABS tablet Take 2.5 mg by mouth 2 (two) times daily.  Marland Kitchen atenolol (TENORMIN) 25 MG tablet Take 25 mg by mouth at bedtime.   . calcium citrate (CALCITRATE) 950 MG tablet Take 1 tablet (200 mg of elemental calcium total) by mouth daily.  . cholecalciferol (VITAMIN D) 1000 UNITS tablet Take 3,000 Units by mouth daily.  Marland Kitchen denosumab (PROLIA) 60 MG/ML SOLN injection Inject 60 mg into the skin every 6 (six) months. Administer in upper arm, thigh, or abdomen  . Dextromethorphan-Guaifenesin (ROBAFEN DM) 10-100 MG/5ML liquid Take 10 mLs by mouth every 6 (six) hours as needed.   . doxazosin (CARDURA) 4 MG tablet One daily to help control BP  . feeding supplement (BOOST / RESOURCE BREEZE) LIQD Take 1 Container by mouth 2 (two) times daily between meals.   . fexofenadine (ALLEGRA) 60 MG tablet Take 1 tablet (60 mg total) by mouth 2 (two) times daily.  . hydrALAZINE (APRESOLINE) 50 MG tablet Take 50 mg by mouth 3 (three) times daily. Hold for SBP <110  . irbesartan (AVAPRO) 300 MG tablet Take 300 mg by mouth daily.  Marland Kitchen levothyroxine (SYNTHROID, LEVOTHROID) 150 MCG tablet Take 150 mcg by mouth daily before  breakfast.  . Lidocaine (ASPERCREME LIDOCAINE) 4 % PTCH Apply 3 patches topically once. Apply 1 patch to the left hip, left knee, and the right shoulder in the morning. Remove in the evening.  . loperamide (IMODIUM A-D) 2 MG tablet Take 2 mg by mouth as needed for diarrhea or loose stools.  . Melatonin 3 MG TABS Take 1 tablet (3 mg total) by mouth at bedtime.  Marland Kitchen omeprazole (PRILOSEC) 10 MG capsule Take 1 capsule (10 mg total) by mouth daily.  Marland Kitchen oxyCODONE-acetaminophen (PERCOCET) 7.5-325 MG tablet Take 1 tablet by mouth 4 (four) times daily. Four times daily scheduled and one at midnight as needed.  . polyethylene glycol powder (GLYCOLAX/MIRALAX) powder Take 17 grams in 6 oz water or juice daily to prevent constipation  . promethazine (PHENERGAN) 12.5 MG suppository Place 12.5 mg every 4 (four) hours as needed rectally  for nausea or vomiting.  . Psyllium (METAMUCIL SMOOTH TEXTURE) 28.3 % POWD One tablespoon in 6 oz wsater or juice daily to help prevent constipation  . senna-docusate (SENOKOT S) 8.6-50 MG tablet 2 tablets nightly to prevent constipation  . sodium chloride 1 g tablet 1 tablet daily for hyponatremia  . vitamin B-12 (CYANOCOBALAMIN) 100 MCG tablet Take 1 tablet (100 mcg total) by mouth daily.  . [DISCONTINUED] acetaminophen (TYLENOL) 325 MG tablet Take 650 mg by mouth every 4 (four) hours as needed.  . [DISCONTINUED] nystatin (MYCOSTATIN) 100000 UNIT/ML suspension Take 5 mLs by mouth 4 (four) times daily.   No facility-administered encounter medications on file as of 03/20/2017.     Review of Systems  Constitutional: Negative for appetite change, chills and fever.  HENT: Negative for congestion, dental problem, mouth sores, postnasal drip, rhinorrhea and sore throat.   Eyes: Negative for discharge and itching.  Respiratory: Negative for shortness of breath.   Cardiovascular: Negative for chest pain.  Skin: Negative for rash and wound.    Immunization History  Administered  Date(s) Administered  . Influenza Split 02/13/2013  . Influenza-Unspecified 02/27/2014, 02/12/2015, 02/25/2016, 03/08/2017  . PPD Test 09/09/2015  . Pneumococcal Conjugate-13 05/05/2016  . Pneumococcal Polysaccharide-23 02/13/2013  . Td 05/05/2016  . Zoster 05/04/2016   Pertinent  Health Maintenance Due  Topic Date Due  . MAMMOGRAM  09/09/2017 (Originally 03/21/1945)  . INFLUENZA VACCINE  Completed  . DEXA SCAN  Completed  . PNA vac Low Risk Adult  Completed   Fall Risk  01/02/2017 11/10/2015 10/20/2015 10/20/2015 06/30/2015  Falls in the past year? No No Yes No No  Number falls in past yr: - - 2 or more - -  Injury with Fall? - - Yes - -  Risk Factor Category  - - - - -  Risk for fall due to : - - History of fall(s);Impaired balance/gait - -  Follow up - - - - -   Functional Status Survey:    Vitals:   03/20/17 1035  BP: 134/74  Pulse: 78  Resp: 18  Temp: 99.6 F (37.6 C)  TempSrc: Oral  SpO2: 96%  Weight: 141 lb 6.4 oz (64.1 kg)  Height: 5\' 8"  (1.727 m)   Body mass index is 21.5 kg/m. Physical Exam  Constitutional: She appears well-developed and well-nourished. No distress.  HENT:  Head: Normocephalic and atraumatic.  Mouth/Throat: No oropharyngeal exudate.  Moist mucus membrane, few white patches to tongue, none to buccal mucosa, no open sores inside her oral cavity.   Neck: Neck supple.  Pulmonary/Chest: Effort normal and breath sounds normal.  Lymphadenopathy:    She has no cervical adenopathy.  Skin: Skin is warm and dry. No rash noted. She is not diaphoretic. No erythema.  Rough skin to touch on nose and cheek, no erythema, intact skin  Psychiatric: She has a normal mood and affect.    Labs reviewed: Recent Labs    02/20/17 03/06/17 03/13/17  NA 128* 128* 127*  K 4.1 4.4 4.3  BUN 30* 22* 21  CREATININE 1.2* 1.1 1.1   Recent Labs    09/19/16 01/02/17 02/20/17  AST 15 14 15   ALT 7 10 12   ALKPHOS 44 45 46   Recent Labs    05/05/16 06/23/16 09/19/16    WBC 4.4 5.6 4.4  HGB 11.7* 12.3 11.9*  HCT 35* 36 34*  PLT 158 159 156   Lab Results  Component Value Date   TSH 2.80 10/27/2016  No results found for: HGBA1C Lab Results  Component Value Date   CHOL 175 11/10/2014   HDL 53 11/10/2014   LDLCALC 87 11/10/2014   TRIG 173 (A) 11/10/2014    Significant Diagnostic Results in last 30 days:  No results found.  Assessment/Plan  Dry skin No signs of infection. Advised to keep skin clean and moisturized. Patient would not want to try new cream to her face but agrees to try applying her nivea cream several times in the day. Reassess if worsens or fails to improve.   Mouth sore Resolved.   Hyponatremia Chronic. Currently on nacl 1 g daily. Check bmp in 2 weeks.   White patches Patchy tongue coating. Concern for geographic tongue, pending ENT appointment. No signs of infection on oral exam today. Monitor.    Family/ staff Communication: reviewed care plan with patient and charge nurse.   Labs/tests ordered:  bmp  Blanchie Serve, MD Internal Medicine [facility] [address] [place], [place] [address] Cell Phone (Monday-Friday 8 am - 5 pm): [phone] On Call: [phone] and follow prompts after 5 pm and on weekends Office Phone: [phone] Office Fax: [phone]

## 2017-03-29 ENCOUNTER — Encounter: Payer: Self-pay | Admitting: *Deleted

## 2017-03-29 DIAGNOSIS — R0602 Shortness of breath: Secondary | ICD-10-CM | POA: Diagnosis not present

## 2017-03-29 DIAGNOSIS — J019 Acute sinusitis, unspecified: Secondary | ICD-10-CM | POA: Diagnosis not present

## 2017-03-29 DIAGNOSIS — R42 Dizziness and giddiness: Secondary | ICD-10-CM | POA: Diagnosis not present

## 2017-03-29 DIAGNOSIS — I10 Essential (primary) hypertension: Secondary | ICD-10-CM | POA: Diagnosis not present

## 2017-03-29 LAB — COMPLETE METABOLIC PANEL WITH GFR
ALBUMIN: 3.6
ALK PHOS: 47
ALT: 11
AST: 15
BILIRUBIN TOTAL: 0.8
BUN: 22 — AB (ref 4–21)
Calcium: 8.1
Creat: 0.98
GLUCOSE: 122
Potassium: 4.4
Sodium: 127
TOTAL PROTEIN: 6 g/dL

## 2017-03-29 LAB — CBC AND DIFFERENTIAL
HEMATOCRIT: 33 — AB (ref 36–46)
HEMOGLOBIN: 11.5 — AB (ref 12.0–16.0)
Platelets: 153 (ref 150–399)
WBC: 5

## 2017-03-29 LAB — CBC
HCT: 33.3
HEMOGLOBIN: 11.5
WBC: 5
platelet count: 153

## 2017-03-30 ENCOUNTER — Other Ambulatory Visit: Payer: Self-pay

## 2017-03-30 ENCOUNTER — Emergency Department (HOSPITAL_COMMUNITY): Payer: Medicare Other

## 2017-03-30 ENCOUNTER — Encounter (HOSPITAL_COMMUNITY): Payer: Self-pay | Admitting: Emergency Medicine

## 2017-03-30 ENCOUNTER — Observation Stay (HOSPITAL_BASED_OUTPATIENT_CLINIC_OR_DEPARTMENT_OTHER): Payer: Medicare Other

## 2017-03-30 ENCOUNTER — Observation Stay (HOSPITAL_COMMUNITY)
Admission: EM | Admit: 2017-03-30 | Discharge: 2017-03-31 | Disposition: A | Discharge status: Home / Self Care | Payer: Medicare Other | Attending: Internal Medicine | Admitting: Internal Medicine

## 2017-03-30 DIAGNOSIS — R079 Chest pain, unspecified: Secondary | ICD-10-CM | POA: Diagnosis not present

## 2017-03-30 DIAGNOSIS — I10 Essential (primary) hypertension: Secondary | ICD-10-CM | POA: Diagnosis present

## 2017-03-30 DIAGNOSIS — R519 Headache, unspecified: Secondary | ICD-10-CM

## 2017-03-30 DIAGNOSIS — R358 Other polyuria: Secondary | ICD-10-CM | POA: Insufficient documentation

## 2017-03-30 DIAGNOSIS — I371 Nonrheumatic pulmonary valve insufficiency: Secondary | ICD-10-CM | POA: Diagnosis not present

## 2017-03-30 DIAGNOSIS — R82998 Other abnormal findings in urine: Secondary | ICD-10-CM | POA: Insufficient documentation

## 2017-03-30 DIAGNOSIS — I2510 Atherosclerotic heart disease of native coronary artery without angina pectoris: Secondary | ICD-10-CM | POA: Insufficient documentation

## 2017-03-30 DIAGNOSIS — R51 Headache: Principal | ICD-10-CM | POA: Insufficient documentation

## 2017-03-30 DIAGNOSIS — Z79899 Other long term (current) drug therapy: Secondary | ICD-10-CM | POA: Insufficient documentation

## 2017-03-30 DIAGNOSIS — E1122 Type 2 diabetes mellitus with diabetic chronic kidney disease: Secondary | ICD-10-CM | POA: Diagnosis not present

## 2017-03-30 DIAGNOSIS — Z86711 Personal history of pulmonary embolism: Secondary | ICD-10-CM | POA: Insufficient documentation

## 2017-03-30 DIAGNOSIS — I129 Hypertensive chronic kidney disease with stage 1 through stage 4 chronic kidney disease, or unspecified chronic kidney disease: Secondary | ICD-10-CM | POA: Diagnosis not present

## 2017-03-30 DIAGNOSIS — Z7901 Long term (current) use of anticoagulants: Secondary | ICD-10-CM | POA: Insufficient documentation

## 2017-03-30 DIAGNOSIS — R3589 Other polyuria: Secondary | ICD-10-CM | POA: Diagnosis present

## 2017-03-30 DIAGNOSIS — R0602 Shortness of breath: Secondary | ICD-10-CM | POA: Diagnosis not present

## 2017-03-30 DIAGNOSIS — N183 Chronic kidney disease, stage 3 unspecified: Secondary | ICD-10-CM | POA: Diagnosis present

## 2017-03-30 DIAGNOSIS — R030 Elevated blood-pressure reading, without diagnosis of hypertension: Secondary | ICD-10-CM | POA: Diagnosis not present

## 2017-03-30 DIAGNOSIS — R0782 Intercostal pain: Secondary | ICD-10-CM

## 2017-03-30 DIAGNOSIS — E039 Hypothyroidism, unspecified: Secondary | ICD-10-CM | POA: Insufficient documentation

## 2017-03-30 DIAGNOSIS — R0789 Other chest pain: Secondary | ICD-10-CM | POA: Diagnosis not present

## 2017-03-30 DIAGNOSIS — I714 Abdominal aortic aneurysm, without rupture: Secondary | ICD-10-CM | POA: Diagnosis not present

## 2017-03-30 DIAGNOSIS — E871 Hypo-osmolality and hyponatremia: Secondary | ICD-10-CM | POA: Diagnosis present

## 2017-03-30 DIAGNOSIS — R0781 Pleurodynia: Secondary | ICD-10-CM | POA: Diagnosis present

## 2017-03-30 LAB — CBC
HCT: 33.2 % — ABNORMAL LOW (ref 36.0–46.0)
Hemoglobin: 11.8 g/dL — ABNORMAL LOW (ref 12.0–15.0)
MCH: 33.8 pg (ref 26.0–34.0)
MCHC: 35.5 g/dL (ref 30.0–36.0)
MCV: 95.1 fL (ref 78.0–100.0)
Platelets: 143 10*3/uL — ABNORMAL LOW (ref 150–400)
RBC: 3.49 MIL/uL — ABNORMAL LOW (ref 3.87–5.11)
RDW: 11.8 % (ref 11.5–15.5)
WBC: 5.2 10*3/uL (ref 4.0–10.5)

## 2017-03-30 LAB — OSMOLALITY, URINE: Osmolality, Ur: 234 mOsm/kg — ABNORMAL LOW (ref 300–900)

## 2017-03-30 LAB — ECHOCARDIOGRAM COMPLETE
E decel time: 348 msec
E/e' ratio: 10.33
FS: 33 % (ref 28–44)
Height: 68 in
IVS/LV PW RATIO, ED: 0.78
LA ID, A-P, ES: 36 mm
LA diam end sys: 36 mm
LA diam index: 2.06 cm/m2
LA vol A4C: 55.2 ml
LA vol index: 29.7 mL/m2
LA vol: 51.9 mL
LV E/e' medial: 10.33
LV E/e'average: 10.33
LV PW d: 11.9 mm — AB (ref 0.6–1.1)
LV e' LATERAL: 6.59 cm/s
LVOT SV: 56 mL
LVOT VTI: 16.2 cm
LVOT area: 3.46 cm2
LVOT diameter: 21 mm
LVOT peak vel: 69.2 cm/s
Lateral S' vel: 19.1 cm/s
MV Dec: 348
MV pk A vel: 105 m/s
MV pk E vel: 68.1 m/s
TAPSE: 19.1 mm
TDI e' lateral: 6.59
TDI e' medial: 5.44
Weight: 2256 oz

## 2017-03-30 LAB — BASIC METABOLIC PANEL
Anion gap: 11 (ref 5–15)
BUN: 21 mg/dL — ABNORMAL HIGH (ref 6–20)
CO2: 19 mmol/L — ABNORMAL LOW (ref 22–32)
Calcium: 8.7 mg/dL — ABNORMAL LOW (ref 8.9–10.3)
Chloride: 97 mmol/L — ABNORMAL LOW (ref 101–111)
Creatinine, Ser: 1.03 mg/dL — ABNORMAL HIGH (ref 0.44–1.00)
GFR calc Af Amer: 55 mL/min — ABNORMAL LOW (ref >60)
GFR calc non Af Amer: 47 mL/min — ABNORMAL LOW (ref >60)
Glucose, Bld: 114 mg/dL — ABNORMAL HIGH (ref 65–99)
Potassium: 4 mmol/L (ref 3.5–5.1)
Sodium: 127 mmol/L — ABNORMAL LOW (ref 135–145)

## 2017-03-30 LAB — URINALYSIS, ROUTINE W REFLEX MICROSCOPIC
Bilirubin Urine: NEGATIVE
Glucose, UA: NEGATIVE mg/dL
Hgb urine dipstick: NEGATIVE
Ketones, ur: NEGATIVE mg/dL
Leukocytes, UA: NEGATIVE
Nitrite: NEGATIVE
Protein, ur: NEGATIVE mg/dL
Specific Gravity, Urine: 1.004 — ABNORMAL LOW (ref 1.005–1.030)
pH: 9 — ABNORMAL HIGH (ref 5.0–8.0)

## 2017-03-30 LAB — I-STAT TROPONIN, ED: TROPONIN I, POC: 0.01 ng/mL (ref 0.00–0.08)

## 2017-03-30 LAB — TROPONIN I
Troponin I: 0.04 ng/mL (ref <0.03)
Troponin I: 0.06 ng/mL (ref <0.03)
Troponin I: 0.04 ng/mL (ref <0.03)

## 2017-03-30 LAB — BRAIN NATRIURETIC PEPTIDE: B Natriuretic Peptide: 170.7 pg/mL — ABNORMAL HIGH (ref 0.0–100.0)

## 2017-03-30 LAB — OSMOLALITY: Osmolality: 276 mOsm/kg (ref 275–295)

## 2017-03-30 LAB — PHOSPHORUS: Phosphorus: 2.6 mg/dL (ref 2.5–4.6)

## 2017-03-30 LAB — TSH: TSH: 1.496 u[IU]/mL (ref 0.350–4.500)

## 2017-03-30 LAB — SODIUM, URINE, RANDOM: Sodium, Ur: 74 mmol/L

## 2017-03-30 LAB — CORTISOL: Cortisol, Plasma: 14.9 ug/dL

## 2017-03-30 LAB — MAGNESIUM: MAGNESIUM: 2.1 mg/dL (ref 1.7–2.4)

## 2017-03-30 MED ORDER — KETOROLAC TROMETHAMINE 15 MG/ML IJ SOLN
15.0000 mg | Freq: Once | INTRAMUSCULAR | Status: AC
Start: 2017-03-30 — End: 2017-03-30
  Administered 2017-03-30: 15 mg via INTRAVENOUS
  Filled 2017-03-30: qty 1

## 2017-03-30 MED ORDER — VITAMIN D 1000 UNITS PO TABS
3000.0000 [IU] | ORAL_TABLET | Freq: Every day | ORAL | Status: DC
  Administered 2017-03-30 – 2017-03-31 (×2): 3000 [IU] via ORAL
  Filled 2017-03-30 (×3): qty 3

## 2017-03-30 MED ORDER — SENNOSIDES-DOCUSATE SODIUM 8.6-50 MG PO TABS
2.0000 | ORAL_TABLET | Freq: Every day | ORAL | Status: DC
  Administered 2017-03-30: 2 via ORAL
  Filled 2017-03-30: qty 2

## 2017-03-30 MED ORDER — MORPHINE SULFATE (PF) 4 MG/ML IV SOLN
1.0000 mg | Freq: Once | INTRAVENOUS | Status: AC
  Administered 2017-03-30: 1 mg via INTRAVENOUS
  Filled 2017-03-30: qty 1

## 2017-03-30 MED ORDER — ATENOLOL 25 MG PO TABS
25.0000 mg | ORAL_TABLET | Freq: Every day | ORAL | Status: DC
  Filled 2017-03-30: qty 1

## 2017-03-30 MED ORDER — APIXABAN 2.5 MG PO TABS
2.5000 mg | ORAL_TABLET | Freq: Two times a day (BID) | ORAL | Status: DC
  Administered 2017-03-30 – 2017-03-31 (×3): 2.5 mg via ORAL
  Filled 2017-03-30 (×4): qty 1

## 2017-03-30 MED ORDER — HYDRALAZINE HCL 20 MG/ML IJ SOLN
INTRAMUSCULAR | Status: AC
  Administered 2017-03-30: 10 mg via INTRAVENOUS
  Filled 2017-03-30: qty 1

## 2017-03-30 MED ORDER — ACETAMINOPHEN 325 MG PO TABS
650.0000 mg | ORAL_TABLET | Freq: Once | ORAL | Status: AC
  Administered 2017-03-30: 650 mg via ORAL
  Filled 2017-03-30: qty 2

## 2017-03-30 MED ORDER — LORATADINE 10 MG PO TABS
10.0000 mg | ORAL_TABLET | Freq: Every day | ORAL | Status: DC
  Administered 2017-03-30 – 2017-03-31 (×2): 10 mg via ORAL
  Filled 2017-03-30 (×2): qty 1

## 2017-03-30 MED ORDER — NITROGLYCERIN 0.4 MG SL SUBL: 0.4000 mg | SUBLINGUAL_TABLET | SUBLINGUAL | Status: DC | PRN

## 2017-03-30 MED ORDER — OXYCODONE-ACETAMINOPHEN 7.5-325 MG PO TABS
1.0000 | ORAL_TABLET | Freq: Four times a day (QID) | ORAL | Status: DC
  Administered 2017-03-30 – 2017-03-31 (×5): 1 via ORAL
  Filled 2017-03-30 (×5): qty 1

## 2017-03-30 MED ORDER — ONDANSETRON HCL 4 MG/2ML IJ SOLN: 4.0000 mg | Freq: Four times a day (QID) | INTRAMUSCULAR | Status: DC | PRN

## 2017-03-30 MED ORDER — LEVOTHYROXINE SODIUM 75 MCG PO TABS
150.0000 ug | ORAL_TABLET | Freq: Every day | ORAL | Status: DC
  Administered 2017-03-31: 150 ug via ORAL
  Filled 2017-03-30: qty 2

## 2017-03-30 MED ORDER — HYDRALAZINE HCL 20 MG/ML IJ SOLN
10.0000 mg | Freq: Once | INTRAMUSCULAR | Status: AC
  Administered 2017-03-30: 10 mg via INTRAVENOUS

## 2017-03-30 MED ORDER — CALCIUM CITRATE 950 (200 CA) MG PO TABS
200.0000 mg | ORAL_TABLET | Freq: Every day | ORAL | Status: DC
Start: 2017-03-30 — End: 2017-03-31
  Administered 2017-03-30 – 2017-03-31 (×2): 200 mg via ORAL
  Filled 2017-03-30 (×2): qty 1

## 2017-03-30 MED ORDER — ACETAMINOPHEN 325 MG PO TABS: 650.0000 mg | ORAL_TABLET | Freq: Four times a day (QID) | ORAL | Status: DC | PRN

## 2017-03-30 MED ORDER — HYDRALAZINE HCL 50 MG PO TABS
50.0000 mg | ORAL_TABLET | Freq: Three times a day (TID) | ORAL | Status: DC
  Administered 2017-03-30 – 2017-03-31 (×4): 50 mg via ORAL
  Filled 2017-03-30 (×3): qty 1
  Filled 2017-03-30: qty 2

## 2017-03-30 MED ORDER — BOOST / RESOURCE BREEZE PO LIQD
1.0000 | Freq: Two times a day (BID) | ORAL | Status: DC
  Administered 2017-03-30 – 2017-03-31 (×4): 1 via ORAL
  Filled 2017-03-30: qty 1

## 2017-03-30 MED ORDER — IOPAMIDOL (ISOVUE-370) INJECTION 76%
INTRAVENOUS | Status: AC
  Administered 2017-03-30: 100 mL
  Filled 2017-03-30: qty 100

## 2017-03-30 MED ORDER — VITAMIN B-12 100 MCG PO TABS
100.0000 ug | ORAL_TABLET | Freq: Every day | ORAL | Status: DC
  Administered 2017-03-30 – 2017-03-31 (×2): 100 ug via ORAL
  Filled 2017-03-30 (×2): qty 1

## 2017-03-30 MED ORDER — ONDANSETRON HCL 4 MG PO TABS: 4.0000 mg | ORAL_TABLET | Freq: Four times a day (QID) | ORAL | Status: DC | PRN

## 2017-03-30 MED ORDER — SODIUM CHLORIDE 1 G PO TABS
1.0000 g | ORAL_TABLET | Freq: Every day | ORAL | Status: DC
  Administered 2017-03-30 – 2017-03-31 (×2): 1 g via ORAL
  Filled 2017-03-30 (×2): qty 1

## 2017-03-30 MED ORDER — PANTOPRAZOLE SODIUM 40 MG PO TBEC
40.0000 mg | DELAYED_RELEASE_TABLET | Freq: Every day | ORAL | Status: DC
  Administered 2017-03-30 – 2017-03-31 (×2): 40 mg via ORAL
  Filled 2017-03-30 (×2): qty 1

## 2017-03-30 MED ORDER — ACETAMINOPHEN 650 MG RE SUPP: 650.0000 mg | Freq: Four times a day (QID) | RECTAL | Status: DC | PRN

## 2017-03-30 MED ORDER — IRBESARTAN 150 MG PO TABS
300.0000 mg | ORAL_TABLET | Freq: Every day | ORAL | Status: DC
  Administered 2017-03-30 – 2017-03-31 (×2): 300 mg via ORAL
  Filled 2017-03-30: qty 1
  Filled 2017-03-30: qty 2

## 2017-03-30 MED ORDER — LIDOCAINE 5 % EX PTCH
1.0000 | MEDICATED_PATCH | CUTANEOUS | Status: DC
  Administered 2017-03-30 – 2017-03-31 (×2): 1 via TRANSDERMAL
  Filled 2017-03-30 (×2): qty 1

## 2017-03-30 MED ORDER — DOXAZOSIN MESYLATE 4 MG PO TABS
4.0000 mg | ORAL_TABLET | Freq: Every day | ORAL | Status: DC
  Administered 2017-03-30 – 2017-03-31 (×2): 4 mg via ORAL
  Filled 2017-03-30 (×2): qty 1

## 2017-03-30 NOTE — ED Triage Notes (Signed)
Pt brought to ED by [facility] from [facility] for c/o left 8/10 axillar Pain and HA, pt denies any fever, chills, nausea or vomiting last feeling normal yesterday around 5 pm. Pt is AO x4 no neuro deficit noticed.  VS  BP 214/100 HR 66 R 20 SPO2 98% RA

## 2017-03-30 NOTE — ED Notes (Signed)
Pt ambulated to bathroom with steady gait. 

## 2017-03-30 NOTE — H&P (Signed)
History and Physical    [name] DPO:[id number] DOB: [birth year] DOA: 03/30/2017   PCP: Blanchie Serve, MD   Attending physician: Manuella Ghazi  Patient coming from/Resides with: SNF[facility]  Chief Complaint: Left axilla pain and headache  HPI: Bailey [name] is a 83 y.o. female with medical history significant for chronic hyponatremia with chronic low urine specific gravity, hypertension, stage III chronic kidney disease, history of PE on Eliquis, hypothyroidism, osteoporosis with prior compression fractures and kyphoplasty.  He was sent from the nursing facility for reports of headache and left chest discomfort.  Symptoms began around 5 PM on 11/14 and were described as gradual onset of frontal headache.  She also noted left lateral rib cage radiating towards the back but not involving the spine discomfort that is tender to palpation.  He had not taken any medication for this.  Upon arrival to the ER she was moderately hypertensive.  Chest x-ray did not reveal any new compression fractures of the spine, CT of the head was unremarkable for acute changes, CT angio of the chest, abdomen, pelvis for dissection was negative.  The patient has had greater than 1000 cc of urinary output since arrival to the ER.  ED Course:  Vital Signs: BP (!) 189/100   Pulse 65   Temp 97.7 F (36.5 C) (Oral)   Resp 16   Ht '5\' 8"'  (1.727 m)   Wt 64 kg (141 lb)   SpO2 99%   BMI 21.44 kg/m  Chest x-ray: Negative CT head: Negative CTA chest/abdomen/pelvis: Negative for dissection or other significant acute vascular abnormalities Lab data: Sodium 127, potassium 4.0, chloride 97, CO2 19, glucose 114, BUN 21, creatinine 1.03, calcium 8.7, anion gap 11, BNP 170, poc troponin 0 0.01, white count 5200 differential not obtained, hemoglobin 9.8, platelets 143,000, urinalysis abnormal with straw-colored appearance, pH of 9 and specific gravity 1.004 otherwise unremarkable Medications and treatments: Hydralazine  10 mg IV x1, Tylenol 650 mg x1  Review of Systems:  In addition to the HPI above,  No Fever-chills, myalgias or other constitutional symptoms No changes with Vision or hearing, new weakness, tingling, numbness in any extremity, dizziness, dysarthria or word finding difficulty, gait disturbance or imbalance, tremors or seizure activity No problems swallowing food or Liquids, indigestion/reflux, choking or coughing while eating, abdominal pain with or after eating No Cough or Shortness of Breath, palpitations, orthopnea or DOE No Abdominal pain, N/V, melena,hematochezia, dark tarry stools, constipation No dysuria, malodorous urine, hematuria or flank pain No new skin rashes, lesions, masses or bruises, No new joint pains, aches, swelling or redness No recent unintentional weight gain or loss No polydypsia or polyphagia   Past Medical History:  Diagnosis Date  . Acquired cyst of kidney    left  . Allergic rhinitis, cause unspecified   . Anemia, unspecified   . Chronic hyponatremia 09/05/2015  . CKD stage 2 due to type 2 diabetes mellitus ([place]) 11/18/2014  . Closed fracture of unspecified part of vertebral column without mention of spinal cord injury    T7 s/p kyphoplasty, T12  . Edema 2015  . Herpes simplex without mention of complication   . History of Epstein-Barr virus infection   . Hyposmolality and/or hyponatremia    07/13/15 Na 131  . Insomnia   . Insomnia, unspecified   . Left cavernous carotid aneurysm 08/2008   1-1/45m aneurysm of cavernous carotid artery  . Malignant neoplasm of breast (female), unspecified site 1990   left. Had surgerey and  chemo, but no radiation  . 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 Neuropathy   . Osteoarthrosis of knee    bilaterally  . Osteoarthrosis, hip   . Osteoarthrosis, unspecified whether generalized or localized, forearm    bilaterally wrist  . Other and unspecified nonspecific immunological  findings    positive ANA  . Other B-complex deficiencies   . Pain in joint, site unspecified    severe, diffuse, chronic pain  . Positive ANA (antinuclear antibody)   . Pulmonary embolism ([place]) 05/07/2015  . Pure hypercholesterolemia   . Senile osteoporosis   . Unspecified essential hypertension   . Unspecified gastritis and gastroduodenitis without mention of hemorrhage   . Unspecified hypothyroidism   . Unspecified vitamin D deficiency     Past Surgical History:  Procedure Laterality Date  . DILATION AND CURETTAGE OF UTERUS  X 2  . FEMUR IM NAIL Right 03/18/2015   Procedure: INTRAMEDULLARY (IM) NAIL FEMORAL;  Surgeon: Rod Can, MD;  Location: [facility];  Service: Orthopedics;  Laterality: Right;  . FRACTURE SURGERY    . KYPHOPLASTY  07/03/2010   T12  . KYPHOPLASTY     C7  . LAPAROSCOPIC CHOLECYSTECTOMY  09/20/2006  . MASTECTOMY Left 04/29/1989  . MIDDLE EAR SURGERY Bilateral 1938  . NASAL SINUS SURGERY  1990 & 2000  . ORIF HIP FRACTURE Left 06/13/2007   following a syncopal episode  . TONSILLECTOMY  1968    Social History   Socioeconomic History  . Marital status: Married    Spouse name: Not on file  . Number of children: Not on file  . Years of education: Not on file  . Highest education level: Not on file  Social Needs  . Financial resource strain: Not on file  . Food insecurity - worry: Not on file  . Food insecurity - inability: Not on file  . Transportation needs - medical: Not on file  . Transportation needs - non-medical: Not on file  Occupational History  . Occupation: retired Marine scientist  Tobacco Use  . Smoking status: Never Smoker  . Smokeless tobacco: Never Used  Substance and Sexual Activity  . Alcohol use: Yes    Comment: 05/08/2015 "I'll have a glass of white wine q now and then"  . Drug use: No  . Sexual activity: No  Other Topics Concern  . Not on file  Social History Narrative   Lives at [facility] since 10/16/2013, moved from [place]    Married 1968    Never smoked   No alcohol    Exercise walking, walks with cane   POA/Living Will             Mobility: Rolling walker Work history: Not obtained   Allergies  Allergen Reactions  . Aspirin     Drop in body temp with large quantities, can tolerate low doses of aspirin  . Penicillins Swelling    Has patient had a PCN reaction causing immediate rash, facial/tongue/throat swelling, SOB or lightheadedness with hypotension: No Has patient had a PCN reaction causing severe rash involving mucus membranes or skin necrosis: No Has patient had a PCN reaction that required hospitalization No Has patient had a PCN reaction occurring within the last 10 years: No If all of the above answers are "NO", then may proceed with Cephalosporin use.    Family history reviewed and not pertinent current admission findings or diagnosis  Prior to Admission medications   Medication  Sig Start Date End Date Taking? Authorizing Provider  acetaminophen (TYLENOL) 500 MG tablet Take 500 mg 2 (two) times daily by mouth.    [provider]  alum & mag hydroxide-simeth ([place]) 200-200-20 MG/5ML suspension Take 30 mLs by mouth every 4 (four) hours as needed for indigestion or heartburn.    [provider]  apixaban (ELIQUIS) 2.5 MG TABS tablet Take 2.5 mg by mouth 2 (two) times daily.    [provider]  atenolol (TENORMIN) 25 MG tablet Take 25 mg by mouth at bedtime.  08/31/15   [provider]  calcium citrate (CALCITRATE) 950 MG tablet Take 1 tablet (200 mg of elemental calcium total) by mouth daily. 11/29/16   Ngetich, Dinah C, NP  cholecalciferol (VITAMIN D) 1000 UNITS tablet Take 3,000 Units by mouth daily.    [provider]  denosumab (PROLIA) 60 MG/ML SOLN injection Inject 60 mg into the skin every 6 (six) months. Administer in upper arm, thigh, or abdomen    [provider]  Dextromethorphan-Guaifenesin (ROBAFEN DM) 10-100 MG/5ML liquid Take 10 mLs by  mouth every 6 (six) hours as needed.     [provider]  doxazosin (CARDURA) 4 MG tablet One daily to help control BP 07/04/16   Estill Dooms, MD  feeding supplement (BOOST / RESOURCE BREEZE) LIQD Take 1 Container by mouth 2 (two) times daily between meals.     [provider]  fexofenadine (ALLEGRA) 60 MG tablet Take 1 tablet (60 mg total) by mouth 2 (two) times daily. 02/15/17   Blanchie Serve, MD  hydrALAZINE (APRESOLINE) 50 MG tablet Take 50 mg by mouth 3 (three) times daily. Hold for SBP <110    [provider]  irbesartan (AVAPRO) 300 MG tablet Take 300 mg by mouth daily.    [provider]  levothyroxine (SYNTHROID, LEVOTHROID) 150 MCG tablet Take 150 mcg by mouth daily before breakfast.    [provider]  Lidocaine (ASPERCREME LIDOCAINE) 4 % PTCH Apply 3 patches topically once. Apply 1 patch to the left hip, left knee, and the right shoulder in the morning. Remove in the evening. 05/14/15   Estill Dooms, MD  loperamide (IMODIUM A-D) 2 MG tablet Take 2 mg by mouth as needed for diarrhea or loose stools.    [provider]  Melatonin 3 MG TABS Take 1 tablet (3 mg total) by mouth at bedtime. 12/27/16   Ngetich, Dinah C, NP  omeprazole (PRILOSEC) 10 MG capsule Take 1 capsule (10 mg total) by mouth daily. 02/15/17   Blanchie Serve, MD  oxyCODONE-acetaminophen (PERCOCET) 7.5-325 MG tablet Take 1 tablet by mouth 4 (four) times daily. Four times daily scheduled and one at midnight as needed. 02/22/17   Ngetich, Dinah C, NP  polyethylene glycol powder (GLYCOLAX/MIRALAX) powder Take 17 grams in 6 oz water or juice daily to prevent constipation 12/29/15   Estill Dooms, MD  promethazine (PHENERGAN) 12.5 MG suppository Place 12.5 mg every 4 (four) hours as needed rectally for nausea or vomiting.    [provider]  Psyllium (METAMUCIL SMOOTH TEXTURE) 28.3 % POWD One tablespoon in 6 oz wsater or juice daily to help prevent constipation  12/29/15   Estill Dooms, MD  senna-docusate (SENOKOT S) 8.6-50 MG tablet 2 tablets nightly to prevent constipation 06/30/15   Estill Dooms, MD  sodium chloride 1 g tablet 1 tablet daily for hyponatremia 03/06/17   Blanchie Serve, MD  vitamin B-12 (CYANOCOBALAMIN) 100 MCG tablet Take  1 tablet (100 mcg total) by mouth daily. 03/06/17   Blanchie Serve, MD    Physical Exam: Vitals:   03/30/17 0800 03/30/17 0815 03/30/17 0900 03/30/17 1000  BP: (!) 179/75  (!) 181/84 (!) 189/100  Pulse: 65 71 63 65  Resp: '19 12 16 16  ' Temp:      TempSrc:      SpO2: 98% 98% 99% 99%  Weight:      Height:          Constitutional: NAD, calm, uncomfortable 2/2 ongoing headache Eyes: PERRL, lids and conjunctivae normal ENMT: Mucous membranes are moist. Posterior pharynx clear of any exudate or lesions.age-appropriate dentition.  Neck: normal, supple, no masses, no thyromegaly Respiratory: clear to auscultation bilaterally, no wheezing, no crackles. Normal respiratory effort. No accessory muscle use.  Left lateral chest wall tender to palpation. Cardiovascular: Regular rate and rhythm, no murmurs / rubs / gallops. No extremity edema. 2+ pedal pulses. No carotid bruits.  Abdomen: no tenderness, no masses palpated. No hepatosplenomegaly. Bowel sounds positive.  Genitourinary: PureWick in place draining clear light yellow colored urine to collection canister Musculoskeletal: no clubbing / cyanosis. No joint deformity upper and lower extremities. Good ROM, no contractures. Normal muscle tone.  Skin: no rashes, lesions, ulcers. No induration Neurologic: CN 2-12 grossly intact. Sensation intact, DTR normal. Strength 5/5 x all 4 extremities.  Psychiatric:  Alert and oriented x 3. Normal mood.    Labs on Admission: I have personally reviewed following labs and imaging studies  CBC: Recent Labs  Lab 03/29/17 03/30/17 0604  WBC 5.0 5.2  HGB 11.5 11.8*  HCT 33.3 33.2*  MCV  --  95.1  PLT  --  143*   Basic  Metabolic Panel: Recent Labs  Lab 03/29/17 03/30/17 0604  NA 127 127*  K 4.4 4.0  CL  --  97*  CO2  --  19*  GLUCOSE  --  114*  BUN 22* 21*  CREATININE 0.98 1.03*  CALCIUM 8.1 8.7*   GFR: Estimated Creatinine Clearance: 38.1 mL/min (A) (by C-G formula based on SCr of 1.03 mg/dL (H)). Liver Function Tests: Recent Labs  Lab 03/29/17  AST 15  ALT 11  ALKPHOS 47  BILITOT 0.8  PROT 6.0  ALBUMIN 3.6   No results for input(s): LIPASE, AMYLASE in the last 168 hours. No results for input(s): AMMONIA in the last 168 hours. Coagulation Profile: No results for input(s): INR, PROTIME in the last 168 hours. Cardiac Enzymes: No results for input(s): CKTOTAL, CKMB, CKMBINDEX, TROPONINI in the last 168 hours. BNP (last 3 results) No results for input(s): PROBNP in the last 8760 hours. HbA1C: No results for input(s): HGBA1C in the last 72 hours. CBG: No results for input(s): GLUCAP in the last 168 hours. Lipid Profile: No results for input(s): CHOL, HDL, LDLCALC, TRIG, CHOLHDL, LDLDIRECT in the last 72 hours. Thyroid Function Tests: No results for input(s): TSH, T4TOTAL, FREET4, T3FREE, THYROIDAB in the last 72 hours. Anemia Panel: No results for input(s): VITAMINB12, FOLATE, FERRITIN, TIBC, IRON, RETICCTPCT in the last 72 hours. Urine analysis:    Component Value Date/Time   COLORURINE STRAW (A) 03/30/2017 0631   APPEARANCEUR CLEAR 03/30/2017 0631   LABSPEC 1.004 (L) 03/30/2017 0631   PHURINE 9.0 (H) 03/30/2017 0631   GLUCOSEU NEGATIVE 03/30/2017 0631   HGBUR NEGATIVE 03/30/2017 0631   BILIRUBINUR NEGATIVE 03/30/2017 0631   KETONESUR NEGATIVE 03/30/2017 0631   PROTEINUR NEGATIVE 03/30/2017 0631   UROBILINOGEN 1.0 03/22/2015 1200   NITRITE NEGATIVE  03/30/2017 0631   LEUKOCYTESUR NEGATIVE 03/30/2017 0631   Sepsis Labs: '@LABRCNTIP' (procalcitonin:4,lacticidven:4) )No results found for this or any previous visit (from the past 240 hour(s)).   Radiological Exams on  Admission: Dg Chest 2 View  Result Date: 03/30/2017 CLINICAL DATA:  Left axillary pain and headache. EXAM: CHEST  2 VIEW COMPARISON:  09/05/2015 FINDINGS: Mild enlargement of the cardiopericardial silhouette with tortuous and atherosclerotic aorta. Mild prominence of the contour of the ascending aorta which may be rotational. Thoracic vertebral compression fractures, 2 with augmentations. No new compression fracture. Bony demineralization. Deformity from a prior right proximal humeral fracture. Mild lingular scarring or atelectasis. IMPRESSION: 1. Mild enlargement of the cardiopericardial silhouette, without edema. 2.  Aortic Atherosclerosis (ICD10-I70.0). 3. Stable thoracic compression fractures.  Bony demineralization. 4. Stable deformity from right proximal humeral fracture. Electronically Signed   By: Van Clines M.D.   On: 03/30/2017 07:50   Ct Head Wo Contrast  Result Date: 03/30/2017 CLINICAL DATA:  Acute headache EXAM: CT HEAD WITHOUT CONTRAST TECHNIQUE: Contiguous axial images were obtained from the base of the skull through the vertex without intravenous contrast. COMPARISON:  MRI head 09/08/2015 FINDINGS: Brain: Mild atrophy unchanged. Negative for hydrocephalus. Chronic white matter hypodensity bilaterally unchanged. Negative for acute infarct.  Negative for hemorrhage or mass. Vascular: Atherosclerotic calcification. Negative for hyperdense vessel Skull: Negative Sinuses/Orbits: Medial antrostomy of the maxillary sinus bilaterally with mild mucosal edema in the paranasal sinuses Mastoidectomy bilaterally. Mastoidectomy cavity is clear bilaterally. Other: None IMPRESSION: Atrophy and chronic microvascular ischemic changes in the white matter are stable. No acute abnormality. Electronically Signed   By: Franchot Gallo M.D.   On: 03/30/2017 08:51   Ct Angio Chest/abd/pel For Dissection W And/or W/wo  Result Date: 03/30/2017 CLINICAL DATA:  Chest pain and abnormal EKG. EXAM: CT  ANGIOGRAPHY CHEST, ABDOMEN AND PELVIS TECHNIQUE: Multidetector CT imaging through the chest, abdomen and pelvis was performed using the standard protocol during bolus administration of intravenous contrast. Multiplanar reconstructed images and MIPs were obtained and reviewed to evaluate the vascular anatomy. CONTRAST:  32m ISOVUE-370 IOPAMIDOL (ISOVUE-370) INJECTION 76% COMPARISON:  None. FINDINGS: CTA CHEST FINDINGS Cardiovascular: Noncontrast CT shows no intramural hematoma in the aorta. Postcontrast imaging is affected by motion and streak artifact from EKG leads at the aortic root. No evidence of dissection or intramural hematoma. The aorta is tortuous without aneurysmal dimension. Three vessel branching pattern with patent great vessels. Atherosclerotic calcification of the aorta and coronaries. Trace anterior pericardial fluid. No pulmonary artery filling defect when accounting for streak artifact. Mediastinum/Nodes: Left mastectomy. No axillary adenopathy. No thoracic adenopathy. No explanation for left maxillary pain. Lungs/Pleura: Mosaic attenuation of the lungs, often from small airways disease. There is no edema, consolidation, effusion, or pneumothorax. Central airways are clear Musculoskeletal: See below Left mastectomy Review of the MIP images confirms the above findings. CTA ABDOMEN AND PELVIS FINDINGS VASCULAR Aorta: Diffuse atherosclerotic plaque. The aorta is tortuous without aneurysm. No dissection or evidence of intramural hematoma. Celiac: Atherosclerosis at the ostium without stenosis or dissection. Diffuse atherosclerotic calcification of the splenic artery. SMA: Atherosclerosis at the ostium without flow limiting stenosis or dissection. Renals: Bulky plaque at the left renal artery ostium with poor flow in the left renal artery. Smooth left renal atrophy attributed to chronic arterial compromise. Atherosclerosis of the right renal artery without superimposed dissection or aneurysm. IMA:  Patent Inflow: Diffuse atherosclerotic plaque on the iliacs. There is high-grade narrowing at the origin of the left hypogastric artery, proceeded by lobulated ectasia  measuring up to 121 mm in diameter. There is heavily diseased bilateral superficial femoral arteries, especially on the left where there are areas of flow gap. High-grade narrowing at the distal right common femoral artery due to bulky calcified plaque. Veins: Negative Review of the MIP images confirms the above findings. NON-VASCULAR Hepatobiliary: No focal liver abnormality. Borderline liver surface lobulation without other convincing changes of cirrhosis. Cholecystectomy which likely accounts for the common bile duct dilatation to 17 mm proximally. Normal biliary labs on recent evaluation. Pancreas: Unremarkable. Spleen: Unremarkable. Adrenals/Urinary Tract: Negative adrenals. Smooth left renal atrophy from left renal artery compromise. No hydronephrosis or stone. Unremarkable bladder. Stomach/Bowel: Formed stool throughout the elongated colon. No bowel obstruction or visible inflammation. Mild colonic diverticulosis. Uncomplicated duodenal diverticulum. No appendicitis. Lymphatic:   No mass or adenopathy. Reproductive:Hysterectomy.  Pelvic floor laxity. Other: No ascites or pneumoperitoneum. Musculoskeletal: Remote T6, T7, T8, T12, L1, and L2 compression fractures. Remote manubrial fracture. Status post cement augmentation at T7 and T12. Osteopenia, scoliosis, and spinal degeneration. Remote bilateral intertrochanteric femur fracture with ORIF. Remote obturator ring fractures. Probable remote sacral insufficiency fractures. Review of the MIP images confirms the above findings. IMPRESSION: 1. No evidence of acute aortic syndrome. No acute finding throughout the chest and abdomen. 2.  Aortic Atherosclerosis (ICD10-I70.0). 3. Chronic atherosclerotic compromise of the left renal artery with left renal atrophy. 4. Ectatic proximal left hypogastric  artery measuring 12 mm. 5. Heavily diseased bilateral common femoral and superficial femoral arteries with SFA stenosis greater on the left. 6. Incidental findings noted above. Electronically Signed   By: Monte Fantasia M.D.   On: 03/30/2017 09:06    EKG: (Independently reviewed) sinus rhythm with ventricular rate 64 bpm, QTC 459 ms, normal R wave rotation voltage criteria met for LVH, no acute ischemic changes  Assessment/Plan Principal Problem:   Chest pain/Chest wall pain -Patient presents with headache and chest pain later clarified as chest wall pain noting is reproducible upon palpation and has no other typical symptoms -Initial EKG and troponin reassuring -Given advanced age and risk factors heart score is 4 -For completeness of evaluation cycle troponin and check echocardiogram -Suspect musculoskeletal etiology in a patient with underlying chronic pain syndrome -Place Lidoderm patch to left chest wall -History of kyphoplasty and compression fractures in the past; no evidence of acute compression fracture on chest x-ray but if unable to control pain consider CT thoracic spine to detect occult compression fracture -Continue preadmission Percocet  Active Problems:   Polyuria/Inappropriately low urine specific gravity -Patient denies chronic polyuria although upon review of previous urinalysis does have chronic low specific gravity typically 1.007 -Previous urine studies were collected while patient was still taking Lasix and Aldactone (medications were discontinued secondary to hypotension) -Repeat urine and serum osmolality and urine sodium -1 L fluid restriction/24 hrs -Not on offending medications -Has chronic hyponatremia which is not consistent with chronic polyuria and ADH abnormalities    Uncontrolled hypertension -Potentially etiology for patient's headache -Continue preadmission Tenormin, Cardura, hydralazine, and Avapro -Toradol 50 mg IV x1 for headache (aspirin allergy  clarified: Documented tolerates low-dose aspirin but higher doses causes low temperature) -Current EKG with voltage criteria consistent with LVH-Echo pending -Last echocardiogram was completed in December 2016: EF 60-65%, no LVH, grade 1 diastolic dysfunction without valvular abnormalities    Chronic Hyponatremia -Patient has chronic low sodium with readings between 125 and 129 -At facility has been prescribed daily sodium tablet 1 g which is suggestive of SIADH etiology -No longer on diuretics -Fluid  restriction as above -Repeat TSH and cortisol level -Follow labs -Pending above evaluation may require either higher doses of sodium and or the initiation of demeclocycline -Patient is alert and has no neurological symptoms    CKD (chronic kidney disease), stage III  -Renal function stable and at baseline although she does have mild metabolic acidemia    Unspecified hypothyroidism -Continue Synthroid -Follow-up on TSH    History of pulmonary embolus (PE) -Continue Eliquis -CTA chest today confirms no recurrent PE      DVT prophylaxis: Eliquis Code Status: DNR Family Communication: No family at bedside Disposition Plan: SNF Consults called: None    ELLIS,ALLISON L. ANP-BC [facility] Pager [phone]   If 7PM-7AM, please contact night-coverage [email or web address] Password [email or web address]  03/30/2017, 10:38 AM

## 2017-03-30 NOTE — ED Provider Notes (Signed)
[name] EMERGENCY DEPARTMENT Provider Note   CSN: [id number] Arrival date & time: 03/30/17  0554     History   Chief Complaint Chief Complaint  Patient presents with  . Headache  . left axillar pain    HPI Bailey [name] is a 83 y.o. female with a history of diabetes, CKD stage II, breast cancer (s/p treatment), hypothyroidism, hypertension and PE (after hip surgery in 2016) on Eliquis who presents from [facility] for headache and left chest pain.  Patient is followed at [facility] ([place]) by Dr. Bubba Camp. At approximately 5pm yesterday the patient notes gradual onset of frontal headache and describes as "painful".   She says that she very seldomly gets headaches but this is similar to the ones she has had in the past. She has not taken anything for this. She denies fever, syncope, head trauma, photophobia, phonophobia, UL throbbing pain, N/V, visual changes, dizziness, difficulty with speech, changes in hearing, unilateral weakness, numbness/tingiling, stiff neck, neck pain, rash, jaw claudication, or "thunderclap" onset. Not first HA. Not worst HA of life.  Patient also notes she has had left lower chest pain with radiation to left flank/back since yesterday afternoon.  There is no radiation of the pain into the neck, jaw, or either shoulder.  There is no associated nausea or diaphoresis.  She notes some associated shortness of breath but denies any dyspnea on exertion.  The patient walks with walker at baseline. She denies chest pain being exertional. Is not positional.  She does note that it is slightly more painful when taking a deep breath.  It is also worsened with palpation.  She has not taken anything for this.  Patient has been taking her Eliquis as prescribed. She denies dysuria,  suprapubic pain, frequency, urgency, or hematuria.  No recent surgery or travel.  No lower leg swelling. No falls or trauma.  His last echo was December 2016 which showed normal  systolic function with estimated ejection fraction of 60-65%.  No wall motion abnormalities and grade 1 diastolic dysfunction.  No heart catheterizations. Never smoker.    HPI  Past Medical History:  Diagnosis Date  . Acquired cyst of kidney    left  . Allergic rhinitis, cause unspecified   . Anemia, unspecified   . Chronic hyponatremia 09/05/2015  . CKD stage 2 due to type 2 diabetes mellitus ([place]) 11/18/2014  . Closed fracture of unspecified part of vertebral column without mention of spinal cord injury    T7 s/p kyphoplasty, T12  . Edema 2015  . Herpes simplex without mention of complication   . History of Epstein-Barr virus infection   . Hyposmolality and/or hyponatremia    07/13/15 Na 131  . Insomnia   . Insomnia, unspecified   . Left cavernous carotid aneurysm 08/2008   1-1/36mm aneurysm of cavernous carotid artery  . Malignant neoplasm of breast (female), unspecified site 1990   left. Had surgerey and chemo, but no radiation  . 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 Neuropathy   . Osteoarthrosis of knee    bilaterally  . Osteoarthrosis, hip   . Osteoarthrosis, unspecified whether generalized or localized, forearm    bilaterally wrist  . Other and unspecified nonspecific immunological findings    positive ANA  . Other B-complex deficiencies   . Pain in joint, site unspecified    severe, diffuse, chronic pain  . Positive ANA (antinuclear antibody)   .  Pulmonary embolism ([place]) 05/07/2015  . Pure hypercholesterolemia   . Senile osteoporosis   . Unspecified essential hypertension   . Unspecified gastritis and gastroduodenitis without mention of hemorrhage   . Unspecified hypothyroidism   . Unspecified vitamin D deficiency     Patient Active Problem List   Diagnosis Date Noted  . History of pulmonary embolism 02/15/2017  . Osteoporosis without current pathological fracture 02/15/2017  . B12 deficiency 02/15/2017  .  Generalized arthritis 02/15/2017  . Weakness generalized   . Anemia 09/05/2015  . Chronic hyponatremia 09/05/2015  . Pulmonary embolus ([place]) 05/07/2015  . Chronic pain syndrome 04/16/2015  . Muscle spasm of right leg 03/31/2015  . Fracture of multiple pubic rami ([place]) 03/30/2015  . GERD without esophagitis 03/24/2015  . Chronic constipation 03/24/2015  . Fracture, intertrochanteric, right femur ([place])   . CKD (chronic kidney disease) stage 2, GFR 60-89 ml/min 11/18/2014  . Osteoarthritis 03/25/2014  . Osteoarthrosis, forearm   . Senile osteoporosis   . Malignant neoplasm of female breast ([place])   . Insomnia   . Hypothyroidism   . Pure hypercholesterolemia   . Closed fracture of unspecified part of vertebral column without mention of spinal cord injury   . Essential hypertension   . Osteoarthrosis, hip   . Edema     Past Surgical History:  Procedure Laterality Date  . DILATION AND CURETTAGE OF UTERUS  X 2  . FEMUR IM NAIL Right 03/18/2015   Procedure: INTRAMEDULLARY (IM) NAIL FEMORAL;  Surgeon: Rod Can, MD;  Location: [facility] ORS;  Service: Orthopedics;  Laterality: Right;  . FRACTURE SURGERY    . KYPHOPLASTY  07/03/2010   T12  . KYPHOPLASTY     C7  . LAPAROSCOPIC CHOLECYSTECTOMY  09/20/2006  . MASTECTOMY Left 04/29/1989  . MIDDLE EAR SURGERY Bilateral 1938  . NASAL SINUS SURGERY  1990 & 2000  . ORIF HIP FRACTURE Left 06/13/2007   following a syncopal episode  . TONSILLECTOMY  1968    OB History    No data available       Home Medications    Prior to Admission medications   Medication Sig Start Date End Date Taking? Authorizing Provider  acetaminophen (TYLENOL) 500 MG tablet Take 500 mg 2 (two) times daily by mouth.    [provider]  alum & mag hydroxide-simeth (Jet) 200-200-20 MG/5ML suspension Take 30 mLs by mouth every 4 (four) hours as needed for indigestion or heartburn.    [provider]  apixaban (ELIQUIS) 2.5 MG TABS tablet Take 2.5 mg  by mouth 2 (two) times daily.    [provider]  atenolol (TENORMIN) 25 MG tablet Take 25 mg by mouth at bedtime.  08/31/15   [provider]  calcium citrate (CALCITRATE) 950 MG tablet Take 1 tablet (200 mg of elemental calcium total) by mouth daily. 11/29/16   Ngetich, Dinah C, NP  cholecalciferol (VITAMIN D) 1000 UNITS tablet Take 3,000 Units by mouth daily.    [provider]  denosumab (PROLIA) 60 MG/ML SOLN injection Inject 60 mg into the skin every 6 (six) months. Administer in upper arm, thigh, or abdomen    [provider]  Dextromethorphan-Guaifenesin (ROBAFEN DM) 10-100 MG/5ML liquid Take 10 mLs by mouth every 6 (six) hours as needed.     [provider]  doxazosin (CARDURA) 4 MG tablet One daily to help control BP 07/04/16   Nyoka Cowden [name], MD  feeding supplement (BOOST / RESOURCE BREEZE) LIQD Take 1 Container by  mouth 2 (two) times daily between meals.     [provider]  fexofenadine (ALLEGRA) 60 MG tablet Take 1 tablet (60 mg total) by mouth 2 (two) times daily. 02/15/17   Blanchie Serve, MD  hydrALAZINE (APRESOLINE) 50 MG tablet Take 50 mg by mouth 3 (three) times daily. Hold for SBP <110    [provider]  irbesartan (AVAPRO) 300 MG tablet Take 300 mg by mouth daily.    [provider]  levothyroxine (SYNTHROID, LEVOTHROID) 150 MCG tablet Take 150 mcg by mouth daily before breakfast.    [provider]  Lidocaine (ASPERCREME LIDOCAINE) 4 % PTCH Apply 3 patches topically once. Apply 1 patch to the left hip, left knee, and the right shoulder in the morning. Remove in the evening. 05/14/15   Estill Dooms, MD  loperamide (IMODIUM A-D) 2 MG tablet Take 2 mg by mouth as needed for diarrhea or loose stools.    [provider]  Melatonin 3 MG TABS Take 1 tablet (3 mg total) by mouth at bedtime. 12/27/16   Ngetich, Dinah C, NP  omeprazole (PRILOSEC) 10 MG capsule Take 1 capsule (10 mg total) by mouth  daily. 02/15/17   Blanchie Serve, MD  oxyCODONE-acetaminophen (PERCOCET) 7.5-325 MG tablet Take 1 tablet by mouth 4 (four) times daily. Four times daily scheduled and one at midnight as needed. 02/22/17   Ngetich, Dinah C, NP  polyethylene glycol powder (GLYCOLAX/MIRALAX) powder Take 17 grams in 6 oz water or juice daily to prevent constipation 12/29/15   Estill Dooms, MD  promethazine (PHENERGAN) 12.5 MG suppository Place 12.5 mg every 4 (four) hours as needed rectally for nausea or vomiting.    [provider]  Psyllium (METAMUCIL SMOOTH TEXTURE) 28.3 % POWD One tablespoon in 6 oz wsater or juice daily to help prevent constipation 12/29/15   Estill Dooms, MD  senna-docusate (SENOKOT S) 8.6-50 MG tablet 2 tablets nightly to prevent constipation 06/30/15   Estill Dooms, MD  sodium chloride 1 g tablet 1 tablet daily for hyponatremia 03/06/17   Blanchie Serve, MD  vitamin B-12 (CYANOCOBALAMIN) 100 MCG tablet Take 1 tablet (100 mcg total) by mouth daily. 03/06/17   Blanchie Serve, MD    Family History No family history on file.  Social History Social History   Tobacco Use  . Smoking status: Never Smoker  . Smokeless tobacco: Never Used  Substance Use Topics  . Alcohol use: Yes    Comment: 05/08/2015 "I'll have a glass of white wine q now and then"  . Drug use: No     Allergies   Aspirin and Penicillins   Review of Systems Review of Systems  All other systems reviewed and are negative.    Physical Exam Updated Vital Signs BP (!) 148/96 (BP Location: Right Arm)   Pulse 69   Temp 97.7 F (36.5 C) (Oral)   Resp 19   Ht 5\' 8"  (1.727 m)   Wt 64 kg (141 lb)   SpO2 100%   BMI 21.44 kg/m   Physical Exam  Constitutional: She appears well-developed and well-nourished.  HENT:  Head: Normocephalic and atraumatic.  Right Ear: External ear normal.  Left Ear: External ear normal.  Nose: Nose normal.  Mouth/Throat: Uvula is midline, oropharynx is clear and moist and  mucous membranes are normal. No tonsillar exudate.  No temporal tenderness palpation  Eyes: Pupils are equal, round, and reactive to light. Right eye exhibits no discharge. Left eye exhibits no discharge.  No scleral icterus.  Neck: Trachea normal. Neck supple. No JVD present. No spinous process tenderness present. Carotid bruit is not present. No neck rigidity. Normal range of motion present.  No meningismus  Cardiovascular: Normal rate, regular rhythm and intact distal pulses.  No murmur heard. Pulses:      Radial pulses are 2+ on the right side, and 2+ on the left side.       Dorsalis pedis pulses are 2+ on the right side, and 2+ on the left side.       Posterior tibial pulses are 2+ on the right side, and 2+ on the left side.  No lower extremity swelling or edema. Calves symmetric in size bilaterally.  Pulmonary/Chest: Effort normal and breath sounds normal. She exhibits tenderness.  Abdominal: Soft. Bowel sounds are normal. There is no tenderness. There is CVA tenderness (left). There is no rigidity, no rebound and no guarding.  Musculoskeletal: She exhibits no edema.  Lymphadenopathy:    She has no cervical adenopathy.  Neurological: She is alert.  Mental Status:  Alert, oriented, thought content appropriate, able to give a coherent history. Speech fluent without evidence of aphasia. Able to follow 2 step commands without difficulty.  Cranial Nerves:  II:  Peripheral visual fields grossly normal, pupils equal, round, reactive to light III,IV, VI: ptosis not present, extra-ocular motions intact bilaterally  V,VII: smile symmetric, eyebrows raise symmetric, facial light touch sensation equal VIII: hearing grossly normal to voice  X: uvula elevates symmetrically  XI: bilateral shoulder shrug symmetric and strong XII: midline tongue extension without fassiculations Motor:  Normal tone. 5/5 in upper and lower extremities bilaterally including strong and equal grip strength and  dorsiflexion/plantar flexion Sensory: Sensation intact to light touch in all extremities.  Deep Tendon Reflexes: 2+ and symmetric in the biceps and patella Cerebellar: normal finger-to-nose with bilateral upper extremities. Normal heel-to -shin balance bilaterally of the lower extremity. No pronator drift.  Gait: normal gait  CV: distal pulses palpable throughout   Skin: Skin is warm and dry. No rash noted. She is not diaphoretic.  Psychiatric: She has a normal mood and affect.  Nursing note and vitals reviewed.    ED Treatments / Results  Labs (all labs ordered are listed, but only abnormal results are displayed) Labs Reviewed  BASIC METABOLIC PANEL - Abnormal; Notable for the following components:      Result Value   Sodium 127 (*)    Chloride 97 (*)    CO2 19 (*)    Glucose, Bld 114 (*)    BUN 21 (*)    Creatinine, Ser 1.03 (*)    Calcium 8.7 (*)    GFR calc non Af Amer 47 (*)    GFR calc Af Amer 55 (*)    All other components within normal limits  CBC - Abnormal; Notable for the following components:   RBC 3.49 (*)    Hemoglobin 11.8 (*)    HCT 33.2 (*)    Platelets 143 (*)    All other components within normal limits  URINALYSIS, ROUTINE W REFLEX MICROSCOPIC - Abnormal; Notable for the following components:   Color, Urine STRAW (*)    Specific Gravity, Urine 1.004 (*)    pH 9.0 (*)    All other components within normal limits  BRAIN NATRIURETIC PEPTIDE - Abnormal; Notable for the following components:   B Natriuretic Peptide 170.7 (*)    All other components within normal limits  TROPONIN I  TROPONIN I  TROPONIN  I  SODIUM, URINE, RANDOM  OSMOLALITY, URINE  OSMOLALITY  TSH  I-STAT TROPONIN, ED    EKG  EKG Interpretation  Date/Time:  Thursday March 30 2017 06:02:03 EST Ventricular Rate:  64 PR Interval:    QRS Duration: 113 QT Interval:  444 QTC Calculation: 459 R Axis:   -15 Text Interpretation:  Sinus rhythm Atrial premature complex Left  ventricular hypertrophy Confirmed by Pryor Curia [phone]) on 03/30/2017 6:48:29 AM       Radiology Dg Chest 2 View  Result Date: 03/30/2017 CLINICAL DATA:  Left axillary pain and headache. EXAM: CHEST  2 VIEW COMPARISON:  09/05/2015 FINDINGS: Mild enlargement of the cardiopericardial silhouette with tortuous and atherosclerotic aorta. Mild prominence of the contour of the ascending aorta which may be rotational. Thoracic vertebral compression fractures, 2 with augmentations. No new compression fracture. Bony demineralization. Deformity from a prior right proximal humeral fracture. Mild lingular scarring or atelectasis. IMPRESSION: 1. Mild enlargement of the cardiopericardial silhouette, without edema. 2.  Aortic Atherosclerosis (ICD10-I70.0). 3. Stable thoracic compression fractures.  Bony demineralization. 4. Stable deformity from right proximal humeral fracture. Electronically Signed   By: Van Clines M.D.   On: 03/30/2017 07:50   Ct Head Wo Contrast  Result Date: 03/30/2017 CLINICAL DATA:  Acute headache EXAM: CT HEAD WITHOUT CONTRAST TECHNIQUE: Contiguous axial images were obtained from the base of the skull through the vertex without intravenous contrast. COMPARISON:  MRI head 09/08/2015 FINDINGS: Brain: Mild atrophy unchanged. Negative for hydrocephalus. Chronic white matter hypodensity bilaterally unchanged. Negative for acute infarct.  Negative for hemorrhage or mass. Vascular: Atherosclerotic calcification. Negative for hyperdense vessel Skull: Negative Sinuses/Orbits: Medial antrostomy of the maxillary sinus bilaterally with mild mucosal edema in the paranasal sinuses Mastoidectomy bilaterally. Mastoidectomy cavity is clear bilaterally. Other: None IMPRESSION: Atrophy and chronic microvascular ischemic changes in the white matter are stable. No acute abnormality. Electronically Signed   By: Franchot Gallo M.D.   On: 03/30/2017 08:51   Ct Angio Chest/abd/pel For Dissection W And/or  W/wo  Result Date: 03/30/2017 CLINICAL DATA:  Chest pain and abnormal EKG. EXAM: CT ANGIOGRAPHY CHEST, ABDOMEN AND PELVIS TECHNIQUE: Multidetector CT imaging through the chest, abdomen and pelvis was performed using the standard protocol during bolus administration of intravenous contrast. Multiplanar reconstructed images and MIPs were obtained and reviewed to evaluate the vascular anatomy. CONTRAST:  54mL ISOVUE-370 IOPAMIDOL (ISOVUE-370) INJECTION 76% COMPARISON:  None. FINDINGS: CTA CHEST FINDINGS Cardiovascular: Noncontrast CT shows no intramural hematoma in the aorta. Postcontrast imaging is affected by motion and streak artifact from EKG leads at the aortic root. No evidence of dissection or intramural hematoma. The aorta is tortuous without aneurysmal dimension. Three vessel branching pattern with patent great vessels. Atherosclerotic calcification of the aorta and coronaries. Trace anterior pericardial fluid. No pulmonary artery filling defect when accounting for streak artifact. Mediastinum/Nodes: Left mastectomy. No axillary adenopathy. No thoracic adenopathy. No explanation for left maxillary pain. Lungs/Pleura: Mosaic attenuation of the lungs, often from small airways disease. There is no edema, consolidation, effusion, or pneumothorax. Central airways are clear Musculoskeletal: See below Left mastectomy Review of the MIP images confirms the above findings. CTA ABDOMEN AND PELVIS FINDINGS VASCULAR Aorta: Diffuse atherosclerotic plaque. The aorta is tortuous without aneurysm. No dissection or evidence of intramural hematoma. Celiac: Atherosclerosis at the ostium without stenosis or dissection. Diffuse atherosclerotic calcification of the splenic artery. SMA: Atherosclerosis at the ostium without flow limiting stenosis or dissection. Renals: Bulky plaque at the left renal artery ostium with poor flow in  the left renal artery. Smooth left renal atrophy attributed to chronic arterial compromise.  Atherosclerosis of the right renal artery without superimposed dissection or aneurysm. IMA: Patent Inflow: Diffuse atherosclerotic plaque on the iliacs. There is high-grade narrowing at the origin of the left hypogastric artery, proceeded by lobulated ectasia measuring up to 121 mm in diameter. There is heavily diseased bilateral superficial femoral arteries, especially on the left where there are areas of flow gap. High-grade narrowing at the distal right common femoral artery due to bulky calcified plaque. Veins: Negative Review of the MIP images confirms the above findings. NON-VASCULAR Hepatobiliary: No focal liver abnormality. Borderline liver surface lobulation without other convincing changes of cirrhosis. Cholecystectomy which likely accounts for the common bile duct dilatation to 17 mm proximally. Normal biliary labs on recent evaluation. Pancreas: Unremarkable. Spleen: Unremarkable. Adrenals/Urinary Tract: Negative adrenals. Smooth left renal atrophy from left renal artery compromise. No hydronephrosis or stone. Unremarkable bladder. Stomach/Bowel: Formed stool throughout the elongated colon. No bowel obstruction or visible inflammation. Mild colonic diverticulosis. Uncomplicated duodenal diverticulum. No appendicitis. Lymphatic:   No mass or adenopathy. Reproductive:Hysterectomy.  Pelvic floor laxity. Other: No ascites or pneumoperitoneum. Musculoskeletal: Remote T6, T7, T8, T12, L1, and L2 compression fractures. Remote manubrial fracture. Status post cement augmentation at T7 and T12. Osteopenia, scoliosis, and spinal degeneration. Remote bilateral intertrochanteric femur fracture with ORIF. Remote obturator ring fractures. Probable remote sacral insufficiency fractures. Review of the MIP images confirms the above findings. IMPRESSION: 1. No evidence of acute aortic syndrome. No acute finding throughout the chest and abdomen. 2.  Aortic Atherosclerosis (ICD10-I70.0). 3. Chronic atherosclerotic compromise  of the left renal artery with left renal atrophy. 4. Ectatic proximal left hypogastric artery measuring 12 mm. 5. Heavily diseased bilateral common femoral and superficial femoral arteries with SFA stenosis greater on the left. 6. Incidental findings noted above. Electronically Signed   By: Monte Fantasia M.D.   On: 03/30/2017 09:06    Procedures Procedures (including critical care time)  Medications Ordered in ED Medications  morphine 4 MG/ML injection 1 mg (not administered)  apixaban (ELIQUIS) tablet 2.5 mg (not administered)  atenolol (TENORMIN) tablet 25 mg (not administered)  doxazosin (CARDURA) tablet 4 mg (not administered)  hydrALAZINE (APRESOLINE) tablet 50 mg (not administered)  irbesartan (AVAPRO) tablet 300 mg (not administered)  levothyroxine (SYNTHROID, LEVOTHROID) tablet 150 mcg (not administered)  oxyCODONE-acetaminophen (PERCOCET) 7.5-325 MG per tablet 1 tablet (not administered)  nitroGLYCERIN (NITROSTAT) SL tablet 0.4 mg (not administered)  lidocaine (LIDODERM) 5 % 1 patch (not administered)  hydrALAZINE (APRESOLINE) injection 10 mg (10 mg Intravenous Given 03/30/17 0740)  iopamidol (ISOVUE-370) 76 % injection (100 mLs  Contrast Given 03/30/17 0830)  acetaminophen (TYLENOL) tablet 650 mg (650 mg Oral Given 03/30/17 0947)     Initial Impression / Assessment and Plan / ED Course  I have reviewed the triage vital signs and the nursing notes.  Pertinent labs & imaging results that were available during my care of the patient were reviewed by me and considered in my medical decision making (see chart for details).     Patient with headache and left chest pain/back pain.  Patient has vital signs with elevated blood pressure.  Otherwise reassuring.  Patient says she very seldomly gets headaches however this is similar to the ones she gets in the past.  Her neurologic exam is reassuring and without focal deficits.  Will obtain CT scan however due to age and infrequency of  HA.  Patient with left-sided chest pain and low  back pain.  There is associated shortness of breath.  She is tender on palpation to left lower chest and left flank/back. No urinary symptoms. Will obtain EKG, chest x-ray, troponin, UA, BNP and basic blood work to evaluate.  After discussion with Dr. Leonides Schanz will also obtain CT dissection study to rule out dissection.  Pain medication administered.  Patient given hydralazine for blood pressure.  ECG without evidence of ischemia.  Blood pressure improving.  Pain improved.  UA without signs of infection.  Troponin within normal limits.  BMP with chronic hyponatremia.  Potassium within normal limits.  Kidney function near previous. No anion gap acidosis. No leukocytosis. BNP mildly elevated at 170. CT head with no acute abnormality and with above findings. CT dissection study without evidence of acute aortic syndrome.  No acute findings throughout the chest and abdomen.  There is incidental findings as noted above.   Heart score 4. Concern for cardiac etiology of Chest Pain. Pt has been re-evaluated prior to consult and VSS, NAD, heart RRR, pain 0/10, lungs CTAB. No acute abnormalities found on EKG and first round of cardiac enzymes negative. This case was discussed with Dr. Leonides Schanz and Dr. Thomasene Lot who has seen the patient and agrees with plan to admit for chest pain rule out.   Dr. Manuella Ghazi agrees to admit the patient. Patient is made aware and agrees with plan. Appears medically stable at time of admission.   Vitals:   03/30/17 0745 03/30/17 0800 03/30/17 0815 03/30/17 0900  BP: (!) 169/71 (!) 179/75  (!) 181/84  Pulse: 60 65 71 63  Resp: (!) 21 19 12 16   Temp:      TempSrc:      SpO2: 99% 98% 98% 99%  Weight:      Height:         Final Clinical Impressions(s) / ED Diagnoses   Final diagnoses:  Bad headache  Other chest pain    ED Discharge Orders    None       Jillyn Ledger, PA-C 03/30/17 1020    Ward, Delice Bison, DO 03/31/17 2310

## 2017-03-30 NOTE — Progress Notes (Signed)
  Echocardiogram 2D Echocardiogram has been performed.  Bailey [name] 03/30/2017, 5:20 PM

## 2017-03-30 NOTE — ED Notes (Signed)
Food tray ordered

## 2017-03-30 NOTE — ED Notes (Signed)
Patient transported to CT 

## 2017-03-30 NOTE — Care Management Note (Signed)
Case Management Note  Patient Details  Name: Bailey [name] MRN: [id number] Date of Birth: 1926/08/08  Subjective/Objective:                  Headache and left axillary pain  Action/Plan: CM spoke with the patient at the bedside in the ED. Patient states she lives at [place]. She has a wheelchair and walker but primarily uses the walker.  Expected Discharge Date:                  Expected Discharge Plan:  Assisted Living / Rest Home  In-House Referral:     Discharge planning Services  CM Consult  Post Acute Care Choice:    Choice offered to:     DME Arranged:    DME Agency:     HH Arranged:    HH Agency:     Status of Service:  In process, will continue to follow  If discussed at Long Length of Stay Meetings, dates discussed:    Additional Comments:  Apolonio Schneiders, RN 03/30/2017, 5:59 PM

## 2017-03-30 NOTE — ED Notes (Signed)
Date and time results received: 03/30/17 11:17 AM   Test: Troponin  Critical Value: 0.04  Name of Provider Notified: Bryson Ha NP with [facility]

## 2017-03-30 NOTE — Care Management Obs Status (Signed)
[facility] NOTIFICATION   Patient Details  Name: CAMIAH HUMM MRN: [id number] Date of Birth: 1927-01-14   Medicare Observation Status Notification Given:  Yes    Apolonio Schneiders, RN 03/30/2017, 5:51 PM

## 2017-03-30 NOTE — ED Notes (Signed)
Pt placed on bedpan

## 2017-03-30 NOTE — ED Provider Notes (Signed)
Medical screening examination/treatment/procedure(s) were conducted as a shared visit with non-physician practitioner(s) and myself.  I personally evaluated the patient during the encounter.   EKG Interpretation  Date/Time:  Thursday March 30 2017 06:02:03 EST Ventricular Rate:  64 PR Interval:    QRS Duration: 113 QT Interval:  444 QTC Calculation: 459 R Axis:   -15 Text Interpretation:  Sinus rhythm Atrial premature complex Left ventricular hypertrophy Reconfirmed by Zenovia Jarred [id number]) on 03/30/2017 7:24:26 AM      Patient is a 83 year old female with history of right-sided PE in 2016 on Eliquis, hypertension, chronic kidney disease, previous breast cancer who presents to the emergency department gradual onset frontal headache that started yesterday.  Also started having left-sided chest pain that radiates into her back all the way down the left side of her back and shortness of breath that started this morning.  On exam, patient is hypertensive and tachypneic.  Her lungs are clear to auscultation.  She is neurologically intact.  Abdomen is soft and nontender.  EKG shows no new ischemic abnormality.  Troponin negative.  CT head negative.  No dissection on CTA chest, abdomen, pelvis but does have heavy arthrosclerotic disease diffusely.  BP improved with hydralazine.  Could be from hypertensive urgency.  Will admit for ACS rule out.   Ward, Delice Bison, DO 03/30/17 2310

## 2017-03-30 NOTE — ED Notes (Signed)
Pt returned from [facility]

## 2017-03-30 NOTE — ED Notes (Signed)
Attempted report x1. 

## 2017-03-31 DIAGNOSIS — Z86711 Personal history of pulmonary embolism: Secondary | ICD-10-CM

## 2017-03-31 DIAGNOSIS — N183 Chronic kidney disease, stage 3 (moderate): Secondary | ICD-10-CM

## 2017-03-31 DIAGNOSIS — I160 Hypertensive urgency: Secondary | ICD-10-CM

## 2017-03-31 DIAGNOSIS — I10 Essential (primary) hypertension: Secondary | ICD-10-CM | POA: Diagnosis not present

## 2017-03-31 DIAGNOSIS — R51 Headache: Secondary | ICD-10-CM

## 2017-03-31 DIAGNOSIS — R0789 Other chest pain: Secondary | ICD-10-CM

## 2017-03-31 DIAGNOSIS — E871 Hypo-osmolality and hyponatremia: Secondary | ICD-10-CM | POA: Diagnosis not present

## 2017-03-31 LAB — COMPREHENSIVE METABOLIC PANEL
ALBUMIN: 3.3 g/dL — AB (ref 3.5–5.0)
ALT: 13 U/L — ABNORMAL LOW (ref 14–54)
ANION GAP: 6 (ref 5–15)
AST: 19 U/L (ref 15–41)
Alkaline Phosphatase: 47 U/L (ref 38–126)
BUN: 21 mg/dL — ABNORMAL HIGH (ref 6–20)
CALCIUM: 8.4 mg/dL — AB (ref 8.9–10.3)
CO2: 24 mmol/L (ref 22–32)
Chloride: 97 mmol/L — ABNORMAL LOW (ref 101–111)
Creatinine, Ser: 1.15 mg/dL — ABNORMAL HIGH (ref 0.44–1.00)
GFR calc non Af Amer: 41 mL/min — ABNORMAL LOW (ref >60)
GFR, EST AFRICAN AMERICAN: 48 mL/min — AB (ref >60)
GLUCOSE: 96 mg/dL (ref 65–99)
POTASSIUM: 4.1 mmol/L (ref 3.5–5.1)
SODIUM: 127 mmol/L — AB (ref 135–145)
TOTAL PROTEIN: 6.2 g/dL — AB (ref 6.5–8.1)
Total Bilirubin: 0.9 mg/dL (ref 0.3–1.2)

## 2017-03-31 LAB — CBC
HEMATOCRIT: 32.4 % — AB (ref 36.0–46.0)
HEMOGLOBIN: 11 g/dL — AB (ref 12.0–15.0)
MCH: 33 pg (ref 26.0–34.0)
MCHC: 34 g/dL (ref 30.0–36.0)
MCV: 97.3 fL (ref 78.0–100.0)
Platelets: 143 10*3/uL — ABNORMAL LOW (ref 150–400)
RBC: 3.33 MIL/uL — AB (ref 3.87–5.11)
RDW: 12.4 % (ref 11.5–15.5)
WBC: 4.5 10*3/uL (ref 4.0–10.5)

## 2017-03-31 NOTE — Progress Notes (Signed)
Report called to Teressa Senter at [facility] at [phone].  Pt going to Bed AL-35.  Transport here ready to take pt.  Son was at bedside briefly who spoke to MD regarding pt getting d/c and the need to have stress test as an outpt/f/u with Cardiology.  Pt in no acute distress.  All belongings taken with pt.

## 2017-03-31 NOTE — Clinical Social Work Note (Signed)
Clinical Social Work Assessment  Patient Details  Name: Bailey [name] MRN: [id number] Date of Birth: 09-29-1926  Date of referral:  03/31/17               Reason for consult:  Facility Placement(from [facility])                Permission sought to share information with:  Facility Sport and exercise psychologist, Family Supports Permission granted to share information::  Yes, Verbal Permission Granted  Name::     [place]::  [facility]  Relationship::  son  Contact Information:  [phone]  Housing/Transportation Living arrangements for the past 2 months:  [place] of Information:  Adult Children Patient Interpreter Needed:  None Criminal Activity/Legal Involvement Pertinent to Current Situation/Hospitalization:  No - Comment as needed Significant Relationships:  Adult Children Lives with:  Facility Resident Do you feel safe going back to the place where you live?  Yes Need for family participation in patient care:  Yes (Comment)  Care giving concerns: Patient from [facility].    Social Worker assessment / plan:  CSW met with patient and son at bedside. Patient is discharged and patient and family agreeable for patient to return to ALF. CSW sent discharge paperwork to ALF and will support with transition back to facility.  Employment status:  Retired Forensic scientist:  Medicare PT Recommendations:  Not assessed at this time [facility] / Referral to community resources:  Other (Comment Required)(back to ALF)  Patient/Family's Response to care: Patient and son appreciative of care.  Patient/Family's Understanding of and Emotional Response to Diagnosis, Current Treatment, and Prognosis: Patient and son with questions about patient's condition and hopeful to speak to physician about discharge.  Emotional Assessment Appearance:  Appears stated age Attitude/Demeanor/Rapport:  Other(appropriate) Affect (typically observed):   Pleasant, Calm Orientation:  Oriented to Self, Oriented to Place, Oriented to  Time, Oriented to Situation Alcohol / Substance use:  Not Applicable Psych involvement (Current and /or in the community):  No (Comment)  Discharge Needs  Concerns to be addressed:  Discharge Planning Concerns Readmission within the last 30 days:  No Current discharge risk:  None Barriers to Discharge:  No Barriers Identified   Estanislado Emms, LCSW 03/31/2017, 3:40 PM

## 2017-03-31 NOTE — Progress Notes (Addendum)
Pt's HR 55-60. Beta Blocker scheduled. Notified provider on call. Per Provider, okay to hold if HR < 60. Will continue to monitor.

## 2017-03-31 NOTE — Plan of Care (Signed)
Pain has improved. Pt denies any cp. VSS. Medication administered as ordered. Will continue to monitor.

## 2017-03-31 NOTE — Progress Notes (Signed)
Patient will discharge back to [facility]F. Anticipated discharge date: 03/31/17 Family notified: Whitlee Sluder, son Transportation by: [facility]  Nurse to call report to [phone].   CSW signing off.  Estanislado Emms, [place]  Clinical Social Worker

## 2017-03-31 NOTE — Discharge Summary (Signed)
Physician Discharge Summary  Patient ID: Bailey [name] MRN: [id number] DOB/AGE: 1926/11/25 83 y.o.  Admit date: 03/30/2017 Discharge date: 03/31/2017  Admission Diagnoses:  Discharge Diagnoses:  Principal Problem:   Chest pain Active Problems:   Chest wall pain   Polyuria   Inappropriately low urine specific gravity   Uncontrolled hypertension   CKD (chronic kidney disease), stage III (HCC)   Chronic Hyponatremia   Unspecified hypothyroidism   History of pulmonary embolus (PE)   Discharged Condition: Stable.  Hospital Course: Patient is an 83 year old Female with past medical history significant for chronic hyponatremia, hypertension, stage III chronic kidney disease, history of PE on Eliquis, hypothyroidism, osteoporosis with prior compression fractures and kyphoplasty. Patient is a resident of an assisted living facility. Patient with pain at the left posterior chest wall area, worse on palpitation, hypertensive urgency and headache. No associated pleuritic chest pain or shortness of breath. Posterior lower chest wall pain is improving with lidocaine patch. Patient was admitted for further assessment and management. Cardiac enzymes were trended, EKG revealed LVH, CTA chest was negative for pulmonary embolism. Left posterior lower chest wall pain has improved. Blood pressure has been optimized. Headache has resolved. Patient will be discharged back to the Assisted living facility. Patient will follow up with PCP and Cardiology within 1 week.  Significant Diagnostic Studies: CTA was negative for pulmonary embolism.  Treatments: BP has been optimized. Chest wall pain is controlled.  Discharge Exam: Blood pressure (!) 116/55, pulse 71, temperature 97.9 F (36.6 C), temperature source Oral, resp. rate 15, height 5\' 8"  (1.727 m), weight 61.9 kg (136 lb 8 oz), SpO2 98 %.   Disposition: Assisted living facility  Discharge Instructions    Call MD for:  severe uncontrolled pain    Complete by:  As directed    Diet - low sodium heart healthy   Complete by:  As directed    Increase activity slowly   Complete by:  As directed      Allergies as of 03/31/2017      Reactions   Aspirin    Drop in body temp with large quantities, can tolerate low doses of aspirin   Penicillins Swelling   Has patient had a PCN reaction causing immediate rash, facial/tongue/throat swelling, SOB or lightheadedness with hypotension: No Has patient had a PCN reaction causing severe rash involving mucus membranes or skin necrosis: No Has patient had a PCN reaction that required hospitalization No Has patient had a PCN reaction occurring within the last 10 years: No If all of the above answers are "NO", then may proceed with Cephalosporin use.      Medication List    STOP taking these medications   acetaminophen 500 MG tablet Commonly known as:  TYLENOL     TAKE these medications   atenolol 25 MG tablet Commonly known as:  TENORMIN Take 25 mg by mouth at bedtime.   calcium citrate 950 MG tablet Commonly known as:  CALCITRATE Take 1 tablet (200 mg of elemental calcium total) by mouth daily.   cholecalciferol 1000 units tablet Commonly known as:  VITAMIN D Take 3,000 Units by mouth daily.   denosumab 60 MG/ML Soln injection Commonly known as:  PROLIA Inject 60 mg every 6 (six) months into the skin. 10th of sixth month - Administer in upper arm, thigh, or abdomen   doxazosin 4 MG tablet Commonly known as:  CARDURA One daily to help control BP What changed:    how much to take  how to take this  when to take this  additional instructions   ELIQUIS 2.5 MG Tabs tablet Generic drug:  apixaban Take 2.5 mg by mouth 2 (two) times daily.   feeding supplement Liqd Take 1 Container by mouth 2 (two) times daily between meals.   fexofenadine 60 MG tablet Commonly known as:  ALLEGRA Take 1 tablet (60 mg total) by mouth 2 (two) times daily.   hydrALAZINE 50 MG  tablet Commonly known as:  APRESOLINE Take 50 mg 3 (three) times daily by mouth. 0600, 1400, 2200 - Hold for SBP <110   irbesartan 300 MG tablet Commonly known as:  AVAPRO Take 300 mg daily by mouth. Hold for BP <90/60   levothyroxine 150 MCG tablet Commonly known as:  SYNTHROID, LEVOTHROID Take 150 mcg by mouth daily before breakfast.   Lidocaine 4 % Ptch Commonly known as:  ASPERCREME LIDOCAINE Apply 3 patches topically once. Apply 1 patch to the left hip, left knee, and the right shoulder in the morning. Remove in the evening.   loperamide 2 MG tablet Commonly known as:  IMODIUM A-D Take 2 mg by mouth as needed for diarrhea or loose stools.   Melatonin 3 MG Tabs Take 1 tablet (3 mg total) by mouth at bedtime.   [place] 200-200-20 MG/5ML suspension Generic drug:  alum & mag hydroxide-simeth Take 30 mLs by mouth every 4 (four) hours as needed for indigestion or heartburn.   omeprazole 10 MG capsule Commonly known as:  PRILOSEC Take 1 capsule (10 mg total) by mouth daily.   oxyCODONE-acetaminophen 7.5-325 MG tablet Commonly known as:  PERCOCET Take 1 tablet by mouth 4 (four) times daily. Four times daily scheduled and one at midnight as needed. What changed:  additional instructions   polyethylene glycol powder powder Commonly known as:  GLYCOLAX/MIRALAX Take 17 grams in 6 oz water or juice daily to prevent constipation   promethazine 12.5 MG suppository Commonly known as:  PHENERGAN Place 12.5 mg every 4 (four) hours as needed rectally for nausea or vomiting.   Psyllium 28.3 % Powd Commonly known as:  METAMUCIL SMOOTH TEXTURE One tablespoon in 6 oz wsater or juice daily to help prevent constipation   ROBAFEN DM 10-100 MG/5ML liquid Generic drug:  Dextromethorphan-Guaifenesin Take 10 mLs by mouth every 6 (six) hours as needed.   senna-docusate 8.6-50 MG tablet Commonly known as:  SENOKOT S 2 tablets nightly to prevent constipation What changed:    how much to  take  how to take this  when to take this  additional instructions   sodium chloride 1 g tablet 1 tablet daily for hyponatremia What changed:    how much to take  how to take this  when to take this  additional instructions   vitamin B-12 100 MCG tablet Commonly known as:  CYANOCOBALAMIN Take 1 tablet (100 mcg total) by mouth daily.        SignedBonnell Public 03/31/2017, 2:29 PM

## 2017-04-03 ENCOUNTER — Encounter: Payer: Self-pay | Admitting: *Deleted

## 2017-04-03 ENCOUNTER — Encounter: Payer: Self-pay | Admitting: Internal Medicine

## 2017-04-03 ENCOUNTER — Non-Acute Institutional Stay: Payer: Medicare Other | Admitting: Internal Medicine

## 2017-04-03 DIAGNOSIS — G894 Chronic pain syndrome: Secondary | ICD-10-CM | POA: Diagnosis not present

## 2017-04-03 DIAGNOSIS — K219 Gastro-esophageal reflux disease without esophagitis: Secondary | ICD-10-CM | POA: Diagnosis not present

## 2017-04-03 DIAGNOSIS — E039 Hypothyroidism, unspecified: Secondary | ICD-10-CM | POA: Diagnosis not present

## 2017-04-03 DIAGNOSIS — D638 Anemia in other chronic diseases classified elsewhere: Secondary | ICD-10-CM | POA: Diagnosis not present

## 2017-04-03 DIAGNOSIS — R5381 Other malaise: Secondary | ICD-10-CM

## 2017-04-03 DIAGNOSIS — Z86711 Personal history of pulmonary embolism: Secondary | ICD-10-CM

## 2017-04-03 DIAGNOSIS — N183 Chronic kidney disease, stage 3 unspecified: Secondary | ICD-10-CM

## 2017-04-03 DIAGNOSIS — K5909 Other constipation: Secondary | ICD-10-CM | POA: Diagnosis not present

## 2017-04-03 DIAGNOSIS — R0789 Other chest pain: Secondary | ICD-10-CM | POA: Diagnosis not present

## 2017-04-03 DIAGNOSIS — E871 Hypo-osmolality and hyponatremia: Secondary | ICD-10-CM

## 2017-04-03 DIAGNOSIS — I10 Essential (primary) hypertension: Secondary | ICD-10-CM | POA: Diagnosis not present

## 2017-04-03 LAB — BASIC METABOLIC PANEL
BUN: 24 — AB (ref 4–21)
Calcium: 8.4
Chloride: 96
Creat: 1.12
GLUCOSE: 100
Potassium: 4
Sodium: 127

## 2017-04-03 NOTE — Progress Notes (Signed)
Provider:  Blanchie Serve MD  Location:  [place] Room Number: 38 Place of Service:  ALF (13)  PCP: Blanchie Serve, MD Patient Care Team: Blanchie Serve, MD as PCP - General (Internal Medicine) Ngetich, Nelda Bucks, NP as Nurse Practitioner (Family Medicine)  Extended Emergency Contact Information Primary Emergency Contact: Ruehl,[name] Address: [id number] [place]          [place], [place] [address] [name] of [place] Phone: [phone] Mobile Phone: [phone] Relation: Spouse Secondary Emergency Contact: Jerde,[name] Address: [address]          [place], [place] [address] [name] of [employer] Phone: [phone] Relation: Son  Code Status: DNR  Goals of Care: Advanced Directive information Advanced Directives 04/03/2017  Does Patient Have a Medical Advance Directive? Yes  Type of Paramedic of [place];Out of facility DNR (pink MOST or yellow form);Living will  Does patient want to make changes to medical advance directive? No - Patient declined  Copy of [place] in Chart? Yes  Would patient like information on creating a medical advance directive? -  Pre-existing out of facility DNR order (yellow form or pink MOST form) Yellow form placed in chart (order not valid for inpatient use)      Chief Complaint  Patient presents with  . Hospitalization Follow-up    Readmission Visit to assisted living     HPI: Patient is a 83 y.o. female seen today for re-admission visit. She resides in assisted living and was in the hospital from 03/30/17-03/31/17 with chest pain, elevated blood pressure and headache for hypertensive urgency. Acute pulmonary embolism, acute pulmonary pathology and acute coronary syndrome were ruled out. Acute fracture was ruled out. Chest pain was thought to be musculoskeletal in nature with history of chronic pain syndrome. She responded well to lidocaine  patch. Her antihypertensives were continued. She is seen in her room today. She complaints of occasional left posterior chest wall pain. Denies any known injury or trauma. I have reviewed her hospital discharge paperwork, imaging and lab work. Echocardiogram shows normal EF 60-65% with normal systolic function.   Past Medical History:  Diagnosis Date  . Acquired cyst of kidney    left  . Allergic rhinitis, cause unspecified   . Anemia, unspecified   . Chronic hyponatremia 09/05/2015  . CKD stage 2 due to type 2 diabetes mellitus ([place]) 11/18/2014  . Closed fracture of unspecified part of vertebral column without mention of spinal cord injury    T7 s/p kyphoplasty, T12  . Edema 2015  . Herpes simplex without mention of complication   . History of Epstein-Barr virus infection   . Hyposmolality and/or hyponatremia    07/13/15 Na 131  . Insomnia   . Insomnia, unspecified   . Left cavernous carotid aneurysm 08/2008   1-1/28mm aneurysm of cavernous carotid artery  . Malignant neoplasm of breast (female), unspecified site 1990   left. Had surgerey and chemo, but no radiation  . 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 Neuropathy   . Osteoarthrosis of knee    bilaterally  . Osteoarthrosis, hip   . Osteoarthrosis, unspecified whether generalized or localized, forearm    bilaterally wrist  . Other and unspecified nonspecific immunological findings    positive ANA  . Other B-complex deficiencies   . Pain in joint, site unspecified    severe, diffuse, chronic pain  . Positive  ANA (antinuclear antibody)   . Pulmonary embolism ([place]) 05/07/2015  . Pure hypercholesterolemia   . Senile osteoporosis   . Unspecified essential hypertension   . Unspecified gastritis and gastroduodenitis without mention of hemorrhage   . Unspecified hypothyroidism   . Unspecified vitamin D deficiency    Past Surgical History:  Procedure Laterality Date  . DILATION AND  CURETTAGE OF UTERUS  X 2  . FRACTURE SURGERY    . INTRAMEDULLARY (IM) NAIL FEMORAL Right 03/18/2015   Performed by Rod Can, MD at [facility] ORS  . KYPHOPLASTY  07/03/2010   T12  . KYPHOPLASTY     C7  . LAPAROSCOPIC CHOLECYSTECTOMY  09/20/2006  . MASTECTOMY Left 04/29/1989  . MIDDLE EAR SURGERY Bilateral 1938  . NASAL SINUS SURGERY  1990 & 2000  . ORIF HIP FRACTURE Left 06/13/2007   following a syncopal episode  . TONSILLECTOMY  1968    reports that  has never smoked. she has never used smokeless tobacco. She reports that she drinks alcohol. She reports that she does not use drugs. Social History   Socioeconomic History  . Marital status: Married    Spouse name: Not on file  . Number of children: Not on file  . Years of education: Not on file  . Highest education level: Not on file  Social Needs  . Financial resource strain: Not on file  . Food insecurity - worry: Not on file  . Food insecurity - inability: Not on file  . Transportation needs - medical: Not on file  . Transportation needs - non-medical: Not on file  Occupational History  . Occupation: retired Marine scientist  Tobacco Use  . Smoking status: Never Smoker  . Smokeless tobacco: Never Used  Substance and Sexual Activity  . Alcohol use: Yes    Comment: 05/08/2015 "I'll have a glass of white wine q now and then"  . Drug use: No  . Sexual activity: No  Other Topics Concern  . Not on file  Social History Narrative   L[facility] at [facility] since 10/16/[facility], moved from [place]    Married 1968   Never smoked   No alcohol    Exercise walking, walks with cane   POA/Living Will             Functional Status Survey:    No family history on file.  Health Maintenance  Topic Date Due  . MAMMOGRAM  09/09/2017 (Originally 03/21/1945)  . TETANUS/TDAP  05/05/2026  . INFLUENZA VACCINE  Completed  . DEXA SCAN  Completed  . PNA vac Low Risk Adult  Completed    Allergies  Allergen Reactions  . Aspirin     Drop in body temp with  large quantities, can tolerate low doses of aspirin  . Penicillins Swelling    Has patient had a PCN reaction causing immediate rash, facial/tongue/throat swelling, SOB or lightheadedness with hypotension: No Has patient had a PCN reaction causing severe rash involving mucus membranes or skin necrosis: No Has patient had a PCN reaction that required hospitalization No Has patient had a PCN reaction occurring within the last 10 years: No If all of the above answers are "NO", then may proceed with Cephalosporin use.    Outpatient Encounter Medications as of 04/03/2017  Medication Sig  . alum & mag hydroxide-simeth (MINTOX) 200-200-20 MG/5ML suspension Take 5 mLs as needed by mouth for indigestion or heartburn.   Marland Kitchen apixaban (ELIQUIS) 2.5 MG TABS tablet Take 2.5 mg by mouth 2 (two) times daily.  Marland Kitchen  atenolol (TENORMIN) 25 MG tablet Take 25 mg by mouth at bedtime.   . calcium citrate (CALCITRATE) 950 MG tablet Take 1 tablet (200 mg of elemental calcium total) by mouth daily.  . cholecalciferol (VITAMIN D) 1000 UNITS tablet Take 3,000 Units by mouth daily.  Marland Kitchen denosumab (PROLIA) 60 MG/ML SOLN injection Inject 60 mg every 6 (six) months into the skin. 10th of sixth month - Administer in upper arm, thigh, or abdomen  . Dextromethorphan-Guaifenesin (ROBAFEN DM) 10-100 MG/5ML liquid Take 10 mLs by mouth every 6 (six) hours as needed.   . doxazosin (CARDURA) 4 MG tablet One daily to help control BP  . feeding supplement (BOOST / RESOURCE BREEZE) LIQD Take 1 Container by mouth 2 (two) times daily between meals.   . fexofenadine (ALLEGRA) 60 MG tablet Take 1 tablet (60 mg total) by mouth 2 (two) times daily.  . hydrALAZINE (APRESOLINE) 50 MG tablet Take 50 mg 3 (three) times daily by mouth. 0600, 1400, 2200 - Hold for SBP <110  . irbesartan (AVAPRO) 300 MG tablet Take 300 mg daily by mouth. Hold for BP <90/60  . levothyroxine (SYNTHROID, LEVOTHROID) 150 MCG tablet Take 150 mcg by mouth daily before breakfast.   . Lidocaine (ASPERCREME LIDOCAINE) 4 % PTCH Apply 3 patches topically once. Apply 1 patch to the left hip, left knee, and the right shoulder in the morning. Remove in the evening.  . Melatonin 3 MG TABS Take 1 tablet (3 mg total) by mouth at bedtime.  Marland Kitchen omeprazole (PRILOSEC) 10 MG capsule Take 1 capsule (10 mg total) by mouth daily.  Marland Kitchen oxyCODONE-acetaminophen (PERCOCET) 7.5-325 MG tablet Take 1 tablet by mouth 4 (four) times daily. Four times daily scheduled and one at midnight as needed.  . polyethylene glycol powder (GLYCOLAX/MIRALAX) powder Take 17 grams in 6 oz water or juice daily to prevent constipation  . Psyllium (METAMUCIL SMOOTH TEXTURE) 28.3 % POWD One tablespoon in 6 oz wsater or juice daily to help prevent constipation  . senna-docusate (SENOKOT S) 8.6-50 MG tablet 2 tablets nightly to prevent constipation  . sodium chloride 1 g tablet 1 tablet daily for hyponatremia  . vitamin B-12 (CYANOCOBALAMIN) 100 MCG tablet Take 1 tablet (100 mcg total) by mouth daily.  . [DISCONTINUED] loperamide (IMODIUM A-D) 2 MG tablet Take 2 mg by mouth as needed for diarrhea or loose stools.  . [DISCONTINUED] promethazine (PHENERGAN) 12.5 MG suppository Place 12.5 mg every 4 (four) hours as needed rectally for nausea or vomiting.   No facility-administered encounter medications on file as of 04/03/2017.     Review of Systems  Constitutional: Negative for appetite change, chills, diaphoresis, fatigue and fever.  HENT: Negative for congestion, ear pain, mouth sores, sinus pressure, sore throat and trouble swallowing.   Respiratory: Negative for cough, shortness of breath and wheezing.   Cardiovascular: Negative for chest pain, palpitations and leg swelling.  Gastrointestinal: Negative for abdominal pain, constipation, diarrhea, nausea and vomiting.  Endocrine: Negative for polydipsia and polyuria.  Genitourinary: Negative for dysuria and flank pain.  Musculoskeletal: Positive for arthralgias and gait  problem. Negative for back pain.       Chronic left hip and both knee arthritis  pain  Skin: Negative for rash and wound.  Neurological: Negative for dizziness, seizures and headaches.  Hematological: Bruises/bleeds easily.  Psychiatric/Behavioral: Negative for behavioral problems, confusion and dysphoric mood.    Vitals:   04/03/17 0954  BP: 112/69  Pulse: 69  Resp: 18  Temp: 98.2 F (36.8 C)  TempSrc: Oral  SpO2: 94%  Weight: 136 lb 12.8 oz (62.1 kg)  Height: 5\' 8"  (1.727 m)   Body mass index is 20.8 kg/m.   Wt Readings from Last 3 Encounters:  04/03/17 136 lb 12.8 oz (62.1 kg)  03/31/17 136 lb 8 oz (61.9 kg)  03/20/17 141 lb 6.4 oz (64.1 kg)   Physical Exam  Constitutional: She is oriented to person, place, and time. She appears well-developed and well-nourished. No distress.  HENT:  Head: Normocephalic and atraumatic.  Nose: Nose normal.  Mouth/Throat: Oropharynx is clear and moist. No oropharyngeal exudate.  Eyes: Conjunctivae and EOM are normal. Pupils are equal, round, and reactive to light.  Neck: Normal range of motion. Neck supple.  Cardiovascular: Normal rate and regular rhythm.  Pulmonary/Chest: Effort normal and breath sounds normal. No respiratory distress. She has no wheezes. She has no rales. She exhibits tenderness.  Reproducible pain to left posterior chest wall area, no palpable mass  Abdominal: Soft. Bowel sounds are normal. There is no tenderness. There is no guarding.  Musculoskeletal:  Trace leg edema, arthritis changes to her fingers, limited ROM with left hip area. Uses wheelchair to ambulate.   Lymphadenopathy:    She has no cervical adenopathy.  Neurological: She is alert and oriented to person, place, and time.  Skin: Skin is warm and dry. No rash noted. She is not diaphoretic.  Psychiatric: She has a normal mood and affect.    Labs reviewed: Basic Metabolic Panel: Recent Labs    03/29/17 03/30/17 0604 03/30/17 1016 03/31/17 0557  NA  127 127*  --  127*  K 4.4 4.0  --  4.1  CL  --  97*  --  97*  CO2  --  19*  --  24  GLUCOSE  --  114*  --  96  BUN 22* 21*  --  21*  CREATININE 0.98 1.03*  --  1.15*  CALCIUM 8.1 8.7*  --  8.4*  MG  --   --  2.1  --   PHOS  --   --  2.6  --    Liver Function Tests: Recent Labs    02/20/17 03/29/17 03/31/17 0557  AST 15 15 19   ALT 12 11 13*  ALKPHOS 46 47 47  BILITOT  --  0.8 0.9  PROT  --  6.0 6.2*  ALBUMIN  --  3.6 3.3*   No results for input(s): LIPASE, AMYLASE in the last 8760 hours. No results for input(s): AMMONIA in the last 8760 hours. CBC: Recent Labs    03/29/17 03/30/17 0604 03/31/17 0557  WBC 5.0  5.0 5.2 4.5  HGB 11.5*  11.5 11.8* 11.0*  HCT 33*  33.3 33.2* 32.4*  MCV  --  95.1 97.3  PLT 153 143* 143*   Cardiac Enzymes: Recent Labs    03/30/17 0949 03/30/17 1545 03/30/17 2113  TROPONINI 0.04* 0.06* 0.04*   BNP: Invalid input(s): POCBNP No results found for: HGBA1C Lab Results  Component Value Date   TSH 1.496 03/30/2017   Lab Results  Component Value Date   VITAMINB12 1,076 02/16/2017   Lab Results  Component Value Date   FOLATE 23.4 09/05/2015   Lab Results  Component Value Date   IRON 75 09/05/2015   TIBC 265 09/05/2015   FERRITIN 124 09/05/2015    Imaging and Procedures obtained prior to SNF admission: Dg Chest 2 View  Result Date: 03/30/2017 CLINICAL DATA:  Left axillary pain and headache. EXAM: CHEST  2 VIEW COMPARISON:  09/05/2015 FINDINGS: Mild enlargement of the cardiopericardial silhouette with tortuous and atherosclerotic aorta. Mild prominence of the contour of the ascending aorta which may be rotational. Thoracic vertebral compression fractures, 2 with augmentations. No new compression fracture. Bony demineralization. Deformity from a prior right proximal humeral fracture. Mild lingular scarring or atelectasis. IMPRESSION: 1. Mild enlargement of the cardiopericardial silhouette, without edema. 2.  Aortic Atherosclerosis  (ICD10-I70.0). 3. Stable thoracic compression fractures.  Bony demineralization. 4. Stable deformity from right proximal humeral fracture. Electronically Signed   By: Van Clines M.D.   On: 03/30/2017 07:50   Ct Head Wo Contrast  Result Date: 03/30/2017 CLINICAL DATA:  Acute headache EXAM: CT HEAD WITHOUT CONTRAST TECHNIQUE: Contiguous axial images were obtained from the base of the skull through the vertex without intravenous contrast. COMPARISON:  MRI head 09/08/2015 FINDINGS: Brain: Mild atrophy unchanged. Negative for hydrocephalus. Chronic white matter hypodensity bilaterally unchanged. Negative for acute infarct.  Negative for hemorrhage or mass. Vascular: Atherosclerotic calcification. Negative for hyperdense vessel Skull: Negative Sinuses/Orbits: Medial antrostomy of the maxillary sinus bilaterally with mild mucosal edema in the paranasal sinuses Mastoidectomy bilaterally. Mastoidectomy cavity is clear bilaterally. Other: None IMPRESSION: Atrophy and chronic microvascular ischemic changes in the white matter are stable. No acute abnormality. Electronically Signed   By: Franchot Gallo M.D.   On: 03/30/2017 08:51   Ct Angio Chest/abd/pel For Dissection W And/or W/wo  Result Date: 03/30/2017 CLINICAL DATA:  Chest pain and abnormal EKG. EXAM: CT ANGIOGRAPHY CHEST, ABDOMEN AND PELVIS TECHNIQUE: Multidetector CT imaging through the chest, abdomen and pelvis was performed using the standard protocol during bolus administration of intravenous contrast. Multiplanar reconstructed images and MIPs were obtained and reviewed to evaluate the vascular anatomy. CONTRAST:  20mL ISOVUE-370 IOPAMIDOL (ISOVUE-370) INJECTION 76% COMPARISON:  None. FINDINGS: CTA CHEST FINDINGS Cardiovascular: Noncontrast CT shows no intramural hematoma in the aorta. Postcontrast imaging is affected by motion and streak artifact from EKG leads at the aortic root. No evidence of dissection or intramural hematoma. The aorta is  tortuous without aneurysmal dimension. Three vessel branching pattern with patent great vessels. Atherosclerotic calcification of the aorta and coronaries. Trace anterior pericardial fluid. No pulmonary artery filling defect when accounting for streak artifact. Mediastinum/Nodes: Left mastectomy. No axillary adenopathy. No thoracic adenopathy. No explanation for left maxillary pain. Lungs/Pleura: Mosaic attenuation of the lungs, often from small airways disease. There is no edema, consolidation, effusion, or pneumothorax. Central airways are clear Musculoskeletal: See below Left mastectomy Review of the MIP images confirms the above findings. CTA ABDOMEN AND PELVIS FINDINGS VASCULAR Aorta: Diffuse atherosclerotic plaque. The aorta is tortuous without aneurysm. No dissection or evidence of intramural hematoma. Celiac: Atherosclerosis at the ostium without stenosis or dissection. Diffuse atherosclerotic calcification of the splenic artery. SMA: Atherosclerosis at the ostium without flow limiting stenosis or dissection. Renals: Bulky plaque at the left renal artery ostium with poor flow in the left renal artery. Smooth left renal atrophy attributed to chronic arterial compromise. Atherosclerosis of the right renal artery without superimposed dissection or aneurysm. IMA: Patent Inflow: Diffuse atherosclerotic plaque on the iliacs. There is high-grade narrowing at the origin of the left hypogastric artery, proceeded by lobulated ectasia measuring up to 121 mm in diameter. There is heavily diseased bilateral superficial femoral arteries, especially on the left where there are areas of flow gap. High-grade narrowing at the distal right common femoral artery due to bulky calcified plaque. Veins: Negative Review of the MIP images confirms the above findings. NON-VASCULAR Hepatobiliary:  No focal liver abnormality. Borderline liver surface lobulation without other convincing changes of cirrhosis. Cholecystectomy which likely  accounts for the common bile duct dilatation to 17 mm proximally. Normal biliary labs on recent evaluation. Pancreas: Unremarkable. Spleen: Unremarkable. Adrenals/Urinary Tract: Negative adrenals. Smooth left renal atrophy from left renal artery compromise. No hydronephrosis or stone. Unremarkable bladder. Stomach/Bowel: Formed stool throughout the elongated colon. No bowel obstruction or visible inflammation. Mild colonic diverticulosis. Uncomplicated duodenal diverticulum. No appendicitis. Lymphatic:   No mass or adenopathy. Reproductive:Hysterectomy.  Pelvic floor laxity. Other: No ascites or pneumoperitoneum. Musculoskeletal: Remote T6, T7, T8, T12, L1, and L2 compression fractures. Remote manubrial fracture. Status post cement augmentation at T7 and T12. Osteopenia, scoliosis, and spinal degeneration. Remote bilateral intertrochanteric femur fracture with ORIF. Remote obturator ring fractures. Probable remote sacral insufficiency fractures. Review of the MIP images confirms the above findings. IMPRESSION: 1. No evidence of acute aortic syndrome. No acute finding throughout the chest and abdomen. 2.  Aortic Atherosclerosis (ICD10-I70.0). 3. Chronic atherosclerotic compromise of the left renal artery with left renal atrophy. 4. Ectatic proximal left hypogastric artery measuring 12 mm. 5. Heavily diseased bilateral common femoral and superficial femoral arteries with SFA stenosis greater on the left. 6. Incidental findings noted above. Electronically Signed   By: Monte Fantasia M.D.   On: 03/30/2017 09:06    Assessment/Plan  Physical deconditioning With pain. Will have patient work with PT/OT as tolerated to regain strength and restore function.  Fall precautions are in place.  Left posterior chest wall pain Reproducible pain. Normal chest xray, CT chest. ACS and PE ruled out. Appears musculoskeletal. Start robaxin 500 mg bid x 3 days for muscle tightness and then q12h prn for 1 week. Continue lidocaine  patch to effected area for pain.  Chronic pain syndrome Pain to left hip and both knees (chronic). Now has pain to posterior chest wall area. Continue oxycodone-acetaminophen 7.5-325 mg qid for now.   Chronic hyponatremia With her history of CKD. Continue sodium tablet 1 g daily and monitor  Hypertension Controlled this am. Continue hydralazine 50 mg tid, irbesartan 300 mg daily and atenolol 25 mg daily with doxazosin 4 mg daily. Monitor clinically.   ckd stage 3 Monitor bmp periodically  History of pulmonary embolism Continue eliquis and monitor  Chronic constipation Continue miralax with senokot s and maintain hydration  Hypothyroidism Continue levothyroxine Lab Results  Component Value Date   TSH 1.496 03/30/2017   gerd Controlled, continue omeprazole for now  Anemia of chronic disease Monitor cbc periodically.   Family/ staff Communication: reviewed care plan with patient and charge nurse.    Labs/tests ordered: cbc, bmp 04/10/17  Blanchie Serve, MD Internal Medicine [facility] [address] [place], [place] [address] Cell Phone (Monday-Friday 8 am - 5 pm): [phone] On Call: [phone] and follow prompts after 5 pm and on weekends Office Phone: [phone] Office Fax: [phone]

## 2017-04-04 ENCOUNTER — Telehealth: Payer: Self-pay

## 2017-04-04 DIAGNOSIS — D500 Iron deficiency anemia secondary to blood loss (chronic): Secondary | ICD-10-CM | POA: Diagnosis not present

## 2017-04-04 DIAGNOSIS — N182 Chronic kidney disease, stage 2 (mild): Secondary | ICD-10-CM | POA: Diagnosis not present

## 2017-04-04 DIAGNOSIS — E039 Hypothyroidism, unspecified: Secondary | ICD-10-CM | POA: Diagnosis not present

## 2017-04-04 DIAGNOSIS — M1610 Unilateral primary osteoarthritis, unspecified hip: Secondary | ICD-10-CM | POA: Diagnosis not present

## 2017-04-04 DIAGNOSIS — I10 Essential (primary) hypertension: Secondary | ICD-10-CM | POA: Diagnosis not present

## 2017-04-04 DIAGNOSIS — C50919 Malignant neoplasm of unspecified site of unspecified female breast: Secondary | ICD-10-CM | POA: Diagnosis not present

## 2017-04-04 DIAGNOSIS — D649 Anemia, unspecified: Secondary | ICD-10-CM | POA: Diagnosis not present

## 2017-04-04 DIAGNOSIS — Z4789 Encounter for other orthopedic aftercare: Secondary | ICD-10-CM | POA: Diagnosis not present

## 2017-04-04 DIAGNOSIS — S72141A Displaced intertrochanteric fracture of right femur, initial encounter for closed fracture: Secondary | ICD-10-CM | POA: Diagnosis not present

## 2017-04-04 DIAGNOSIS — M810 Age-related osteoporosis without current pathological fracture: Secondary | ICD-10-CM | POA: Diagnosis not present

## 2017-04-04 DIAGNOSIS — G894 Chronic pain syndrome: Secondary | ICD-10-CM | POA: Diagnosis not present

## 2017-04-04 DIAGNOSIS — G4700 Insomnia, unspecified: Secondary | ICD-10-CM | POA: Diagnosis not present

## 2017-04-04 DIAGNOSIS — R2681 Unsteadiness on feet: Secondary | ICD-10-CM | POA: Diagnosis not present

## 2017-04-04 DIAGNOSIS — R29898 Other symptoms and signs involving the musculoskeletal system: Secondary | ICD-10-CM | POA: Diagnosis not present

## 2017-04-04 DIAGNOSIS — S72001A Fracture of unspecified part of neck of right femur, initial encounter for closed fracture: Secondary | ICD-10-CM | POA: Diagnosis not present

## 2017-04-04 DIAGNOSIS — R609 Edema, unspecified: Secondary | ICD-10-CM | POA: Diagnosis not present

## 2017-04-04 DIAGNOSIS — E7800 Pure hypercholesterolemia, unspecified: Secondary | ICD-10-CM | POA: Diagnosis not present

## 2017-04-04 DIAGNOSIS — M6281 Muscle weakness (generalized): Secondary | ICD-10-CM | POA: Diagnosis not present

## 2017-04-04 DIAGNOSIS — R1311 Dysphagia, oral phase: Secondary | ICD-10-CM | POA: Diagnosis not present

## 2017-04-04 DIAGNOSIS — M1991 Primary osteoarthritis, unspecified site: Secondary | ICD-10-CM | POA: Diagnosis not present

## 2017-04-04 NOTE — Telephone Encounter (Signed)
Possible re-admission to facility. This is a patient you were seeing at [facility] F/U is needed if patient was re-admitted to facility upon discharge. Hospital discharge from [facility] on 03/31/2017

## 2017-04-10 DIAGNOSIS — E871 Hypo-osmolality and hyponatremia: Secondary | ICD-10-CM | POA: Diagnosis not present

## 2017-04-10 DIAGNOSIS — I10 Essential (primary) hypertension: Secondary | ICD-10-CM | POA: Diagnosis not present

## 2017-04-11 DIAGNOSIS — G894 Chronic pain syndrome: Secondary | ICD-10-CM | POA: Diagnosis not present

## 2017-04-11 DIAGNOSIS — R1311 Dysphagia, oral phase: Secondary | ICD-10-CM | POA: Diagnosis not present

## 2017-04-11 DIAGNOSIS — R29898 Other symptoms and signs involving the musculoskeletal system: Secondary | ICD-10-CM | POA: Diagnosis not present

## 2017-04-11 DIAGNOSIS — S72141A Displaced intertrochanteric fracture of right femur, initial encounter for closed fracture: Secondary | ICD-10-CM | POA: Diagnosis not present

## 2017-04-11 DIAGNOSIS — M6281 Muscle weakness (generalized): Secondary | ICD-10-CM | POA: Diagnosis not present

## 2017-04-11 DIAGNOSIS — R2681 Unsteadiness on feet: Secondary | ICD-10-CM | POA: Diagnosis not present

## 2017-04-13 DIAGNOSIS — M6281 Muscle weakness (generalized): Secondary | ICD-10-CM | POA: Diagnosis not present

## 2017-04-13 DIAGNOSIS — R29898 Other symptoms and signs involving the musculoskeletal system: Secondary | ICD-10-CM | POA: Diagnosis not present

## 2017-04-13 DIAGNOSIS — S72141A Displaced intertrochanteric fracture of right femur, initial encounter for closed fracture: Secondary | ICD-10-CM | POA: Diagnosis not present

## 2017-04-13 DIAGNOSIS — R2681 Unsteadiness on feet: Secondary | ICD-10-CM | POA: Diagnosis not present

## 2017-04-13 DIAGNOSIS — R1311 Dysphagia, oral phase: Secondary | ICD-10-CM | POA: Diagnosis not present

## 2017-04-13 DIAGNOSIS — G894 Chronic pain syndrome: Secondary | ICD-10-CM | POA: Diagnosis not present

## 2017-04-14 DIAGNOSIS — R29898 Other symptoms and signs involving the musculoskeletal system: Secondary | ICD-10-CM | POA: Diagnosis not present

## 2017-04-14 DIAGNOSIS — G894 Chronic pain syndrome: Secondary | ICD-10-CM | POA: Diagnosis not present

## 2017-04-14 DIAGNOSIS — M6281 Muscle weakness (generalized): Secondary | ICD-10-CM | POA: Diagnosis not present

## 2017-04-14 DIAGNOSIS — S72141A Displaced intertrochanteric fracture of right femur, initial encounter for closed fracture: Secondary | ICD-10-CM | POA: Diagnosis not present

## 2017-04-14 DIAGNOSIS — R1311 Dysphagia, oral phase: Secondary | ICD-10-CM | POA: Diagnosis not present

## 2017-04-14 DIAGNOSIS — R2681 Unsteadiness on feet: Secondary | ICD-10-CM | POA: Diagnosis not present

## 2017-04-17 DIAGNOSIS — G4700 Insomnia, unspecified: Secondary | ICD-10-CM | POA: Diagnosis not present

## 2017-04-17 DIAGNOSIS — R29898 Other symptoms and signs involving the musculoskeletal system: Secondary | ICD-10-CM | POA: Diagnosis not present

## 2017-04-17 DIAGNOSIS — E039 Hypothyroidism, unspecified: Secondary | ICD-10-CM | POA: Diagnosis not present

## 2017-04-17 DIAGNOSIS — G894 Chronic pain syndrome: Secondary | ICD-10-CM | POA: Diagnosis not present

## 2017-04-17 DIAGNOSIS — D649 Anemia, unspecified: Secondary | ICD-10-CM | POA: Diagnosis not present

## 2017-04-17 DIAGNOSIS — M6281 Muscle weakness (generalized): Secondary | ICD-10-CM | POA: Diagnosis not present

## 2017-04-17 DIAGNOSIS — I10 Essential (primary) hypertension: Secondary | ICD-10-CM | POA: Diagnosis not present

## 2017-04-17 DIAGNOSIS — S72141A Displaced intertrochanteric fracture of right femur, initial encounter for closed fracture: Secondary | ICD-10-CM | POA: Diagnosis not present

## 2017-04-17 DIAGNOSIS — D500 Iron deficiency anemia secondary to blood loss (chronic): Secondary | ICD-10-CM | POA: Diagnosis not present

## 2017-04-17 DIAGNOSIS — M1991 Primary osteoarthritis, unspecified site: Secondary | ICD-10-CM | POA: Diagnosis not present

## 2017-04-17 DIAGNOSIS — Z4789 Encounter for other orthopedic aftercare: Secondary | ICD-10-CM | POA: Diagnosis not present

## 2017-04-17 DIAGNOSIS — S72001A Fracture of unspecified part of neck of right femur, initial encounter for closed fracture: Secondary | ICD-10-CM | POA: Diagnosis not present

## 2017-04-17 DIAGNOSIS — R609 Edema, unspecified: Secondary | ICD-10-CM | POA: Diagnosis not present

## 2017-04-17 DIAGNOSIS — E7800 Pure hypercholesterolemia, unspecified: Secondary | ICD-10-CM | POA: Diagnosis not present

## 2017-04-17 DIAGNOSIS — M810 Age-related osteoporosis without current pathological fracture: Secondary | ICD-10-CM | POA: Diagnosis not present

## 2017-04-17 DIAGNOSIS — M1610 Unilateral primary osteoarthritis, unspecified hip: Secondary | ICD-10-CM | POA: Diagnosis not present

## 2017-04-17 DIAGNOSIS — C50919 Malignant neoplasm of unspecified site of unspecified female breast: Secondary | ICD-10-CM | POA: Diagnosis not present

## 2017-04-17 DIAGNOSIS — R1311 Dysphagia, oral phase: Secondary | ICD-10-CM | POA: Diagnosis not present

## 2017-04-17 DIAGNOSIS — N182 Chronic kidney disease, stage 2 (mild): Secondary | ICD-10-CM | POA: Diagnosis not present

## 2017-04-17 DIAGNOSIS — R2681 Unsteadiness on feet: Secondary | ICD-10-CM | POA: Diagnosis not present

## 2017-04-18 DIAGNOSIS — R2681 Unsteadiness on feet: Secondary | ICD-10-CM | POA: Diagnosis not present

## 2017-04-18 DIAGNOSIS — R1311 Dysphagia, oral phase: Secondary | ICD-10-CM | POA: Diagnosis not present

## 2017-04-18 DIAGNOSIS — S72141A Displaced intertrochanteric fracture of right femur, initial encounter for closed fracture: Secondary | ICD-10-CM | POA: Diagnosis not present

## 2017-04-18 DIAGNOSIS — G894 Chronic pain syndrome: Secondary | ICD-10-CM | POA: Diagnosis not present

## 2017-04-18 DIAGNOSIS — R29898 Other symptoms and signs involving the musculoskeletal system: Secondary | ICD-10-CM | POA: Diagnosis not present

## 2017-04-18 DIAGNOSIS — M6281 Muscle weakness (generalized): Secondary | ICD-10-CM | POA: Diagnosis not present

## 2017-04-19 DIAGNOSIS — R1311 Dysphagia, oral phase: Secondary | ICD-10-CM | POA: Diagnosis not present

## 2017-04-19 DIAGNOSIS — R2681 Unsteadiness on feet: Secondary | ICD-10-CM | POA: Diagnosis not present

## 2017-04-19 DIAGNOSIS — G894 Chronic pain syndrome: Secondary | ICD-10-CM | POA: Diagnosis not present

## 2017-04-19 DIAGNOSIS — R29898 Other symptoms and signs involving the musculoskeletal system: Secondary | ICD-10-CM | POA: Diagnosis not present

## 2017-04-19 DIAGNOSIS — M6281 Muscle weakness (generalized): Secondary | ICD-10-CM | POA: Diagnosis not present

## 2017-04-19 DIAGNOSIS — S72141A Displaced intertrochanteric fracture of right femur, initial encounter for closed fracture: Secondary | ICD-10-CM | POA: Diagnosis not present

## 2017-04-20 DIAGNOSIS — M6281 Muscle weakness (generalized): Secondary | ICD-10-CM | POA: Diagnosis not present

## 2017-04-20 DIAGNOSIS — G894 Chronic pain syndrome: Secondary | ICD-10-CM | POA: Diagnosis not present

## 2017-04-20 DIAGNOSIS — S72141A Displaced intertrochanteric fracture of right femur, initial encounter for closed fracture: Secondary | ICD-10-CM | POA: Diagnosis not present

## 2017-04-20 DIAGNOSIS — R1311 Dysphagia, oral phase: Secondary | ICD-10-CM | POA: Diagnosis not present

## 2017-04-20 DIAGNOSIS — R29898 Other symptoms and signs involving the musculoskeletal system: Secondary | ICD-10-CM | POA: Diagnosis not present

## 2017-04-20 DIAGNOSIS — R2681 Unsteadiness on feet: Secondary | ICD-10-CM | POA: Diagnosis not present

## 2017-04-23 DIAGNOSIS — R2681 Unsteadiness on feet: Secondary | ICD-10-CM | POA: Diagnosis not present

## 2017-04-23 DIAGNOSIS — M6281 Muscle weakness (generalized): Secondary | ICD-10-CM | POA: Diagnosis not present

## 2017-04-23 DIAGNOSIS — R1311 Dysphagia, oral phase: Secondary | ICD-10-CM | POA: Diagnosis not present

## 2017-04-23 DIAGNOSIS — S72141A Displaced intertrochanteric fracture of right femur, initial encounter for closed fracture: Secondary | ICD-10-CM | POA: Diagnosis not present

## 2017-04-23 DIAGNOSIS — G894 Chronic pain syndrome: Secondary | ICD-10-CM | POA: Diagnosis not present

## 2017-04-23 DIAGNOSIS — R29898 Other symptoms and signs involving the musculoskeletal system: Secondary | ICD-10-CM | POA: Diagnosis not present

## 2017-04-25 DIAGNOSIS — G894 Chronic pain syndrome: Secondary | ICD-10-CM | POA: Diagnosis not present

## 2017-04-25 DIAGNOSIS — R1311 Dysphagia, oral phase: Secondary | ICD-10-CM | POA: Diagnosis not present

## 2017-04-25 DIAGNOSIS — M6281 Muscle weakness (generalized): Secondary | ICD-10-CM | POA: Diagnosis not present

## 2017-04-25 DIAGNOSIS — S72141A Displaced intertrochanteric fracture of right femur, initial encounter for closed fracture: Secondary | ICD-10-CM | POA: Diagnosis not present

## 2017-04-25 DIAGNOSIS — R29898 Other symptoms and signs involving the musculoskeletal system: Secondary | ICD-10-CM | POA: Diagnosis not present

## 2017-04-25 DIAGNOSIS — R2681 Unsteadiness on feet: Secondary | ICD-10-CM | POA: Diagnosis not present

## 2017-04-26 DIAGNOSIS — R1311 Dysphagia, oral phase: Secondary | ICD-10-CM | POA: Diagnosis not present

## 2017-04-26 DIAGNOSIS — R2681 Unsteadiness on feet: Secondary | ICD-10-CM | POA: Diagnosis not present

## 2017-04-26 DIAGNOSIS — R29898 Other symptoms and signs involving the musculoskeletal system: Secondary | ICD-10-CM | POA: Diagnosis not present

## 2017-04-26 DIAGNOSIS — M6281 Muscle weakness (generalized): Secondary | ICD-10-CM | POA: Diagnosis not present

## 2017-04-26 DIAGNOSIS — G894 Chronic pain syndrome: Secondary | ICD-10-CM | POA: Diagnosis not present

## 2017-04-26 DIAGNOSIS — S72141A Displaced intertrochanteric fracture of right femur, initial encounter for closed fracture: Secondary | ICD-10-CM | POA: Diagnosis not present

## 2017-04-27 DIAGNOSIS — G894 Chronic pain syndrome: Secondary | ICD-10-CM | POA: Diagnosis not present

## 2017-04-27 DIAGNOSIS — R2681 Unsteadiness on feet: Secondary | ICD-10-CM | POA: Diagnosis not present

## 2017-04-27 DIAGNOSIS — R29898 Other symptoms and signs involving the musculoskeletal system: Secondary | ICD-10-CM | POA: Diagnosis not present

## 2017-04-27 DIAGNOSIS — R1311 Dysphagia, oral phase: Secondary | ICD-10-CM | POA: Diagnosis not present

## 2017-04-27 DIAGNOSIS — S72141A Displaced intertrochanteric fracture of right femur, initial encounter for closed fracture: Secondary | ICD-10-CM | POA: Diagnosis not present

## 2017-04-27 DIAGNOSIS — M6281 Muscle weakness (generalized): Secondary | ICD-10-CM | POA: Diagnosis not present

## 2017-05-01 DIAGNOSIS — R29898 Other symptoms and signs involving the musculoskeletal system: Secondary | ICD-10-CM | POA: Diagnosis not present

## 2017-05-01 DIAGNOSIS — S72141A Displaced intertrochanteric fracture of right femur, initial encounter for closed fracture: Secondary | ICD-10-CM | POA: Diagnosis not present

## 2017-05-01 DIAGNOSIS — G894 Chronic pain syndrome: Secondary | ICD-10-CM | POA: Diagnosis not present

## 2017-05-01 DIAGNOSIS — R2681 Unsteadiness on feet: Secondary | ICD-10-CM | POA: Diagnosis not present

## 2017-05-01 DIAGNOSIS — M6281 Muscle weakness (generalized): Secondary | ICD-10-CM | POA: Diagnosis not present

## 2017-05-01 DIAGNOSIS — R1311 Dysphagia, oral phase: Secondary | ICD-10-CM | POA: Diagnosis not present

## 2017-05-02 DIAGNOSIS — R2681 Unsteadiness on feet: Secondary | ICD-10-CM | POA: Diagnosis not present

## 2017-05-02 DIAGNOSIS — G894 Chronic pain syndrome: Secondary | ICD-10-CM | POA: Diagnosis not present

## 2017-05-02 DIAGNOSIS — M6281 Muscle weakness (generalized): Secondary | ICD-10-CM | POA: Diagnosis not present

## 2017-05-02 DIAGNOSIS — S72141A Displaced intertrochanteric fracture of right femur, initial encounter for closed fracture: Secondary | ICD-10-CM | POA: Diagnosis not present

## 2017-05-02 DIAGNOSIS — R1311 Dysphagia, oral phase: Secondary | ICD-10-CM | POA: Diagnosis not present

## 2017-05-02 DIAGNOSIS — R29898 Other symptoms and signs involving the musculoskeletal system: Secondary | ICD-10-CM | POA: Diagnosis not present

## 2017-05-03 DIAGNOSIS — G894 Chronic pain syndrome: Secondary | ICD-10-CM | POA: Diagnosis not present

## 2017-05-03 DIAGNOSIS — R29898 Other symptoms and signs involving the musculoskeletal system: Secondary | ICD-10-CM | POA: Diagnosis not present

## 2017-05-03 DIAGNOSIS — R2681 Unsteadiness on feet: Secondary | ICD-10-CM | POA: Diagnosis not present

## 2017-05-03 DIAGNOSIS — M6281 Muscle weakness (generalized): Secondary | ICD-10-CM | POA: Diagnosis not present

## 2017-05-03 DIAGNOSIS — R1311 Dysphagia, oral phase: Secondary | ICD-10-CM | POA: Diagnosis not present

## 2017-05-03 DIAGNOSIS — S72141A Displaced intertrochanteric fracture of right femur, initial encounter for closed fracture: Secondary | ICD-10-CM | POA: Diagnosis not present

## 2017-05-04 DIAGNOSIS — G894 Chronic pain syndrome: Secondary | ICD-10-CM | POA: Diagnosis not present

## 2017-05-04 DIAGNOSIS — S72141A Displaced intertrochanteric fracture of right femur, initial encounter for closed fracture: Secondary | ICD-10-CM | POA: Diagnosis not present

## 2017-05-04 DIAGNOSIS — R1311 Dysphagia, oral phase: Secondary | ICD-10-CM | POA: Diagnosis not present

## 2017-05-04 DIAGNOSIS — R2681 Unsteadiness on feet: Secondary | ICD-10-CM | POA: Diagnosis not present

## 2017-05-04 DIAGNOSIS — R29898 Other symptoms and signs involving the musculoskeletal system: Secondary | ICD-10-CM | POA: Diagnosis not present

## 2017-05-04 DIAGNOSIS — M6281 Muscle weakness (generalized): Secondary | ICD-10-CM | POA: Diagnosis not present

## 2017-05-05 ENCOUNTER — Non-Acute Institutional Stay: Payer: Medicare Other | Admitting: Family

## 2017-05-05 ENCOUNTER — Encounter: Payer: Self-pay | Admitting: *Deleted

## 2017-05-05 DIAGNOSIS — K143 Hypertrophy of tongue papillae: Secondary | ICD-10-CM

## 2017-05-05 NOTE — Progress Notes (Signed)
Opened in error

## 2017-05-05 NOTE — Progress Notes (Signed)
Location:  [place] Room Number: 35 Place of Service:  ALF [phone]) Provider: Gail Creekmore FNP-C  Blanchie Serve, MD  Patient Care Team: Blanchie Serve, MD as PCP - General (Internal Medicine) Lailani Tool, Nelda Bucks, NP as Nurse Practitioner (Family Medicine)  Extended Emergency Contact Information Primary Emergency Contact: Else,[name] Address: [id number] [place]          [place], [place] [address] [name] of [place] Phone: [phone] Mobile Phone: [phone] Relation: Spouse Secondary Emergency Contact: Knock,[name] Address: [address]          [place], [place] [address] [name] of [place] Mobile Phone: [phone] Relation: Son  Code Status:  DNR Goals of care: Advanced Directive information Advanced Directives 05/05/2017  Does Patient Have a Medical Advance Directive? Yes  Type of Paramedic of [place];Out of facility DNR (pink MOST or yellow form);Living will  Does patient want to make changes to medical advance directive? -  Copy of [place] in Chart? Yes  Would patient like information on creating a medical advance directive? -  Pre-existing out of facility DNR order (yellow form or pink MOST form) Yellow form placed in chart (order not valid for inpatient use)     Chief Complaint  Patient presents with  . Acute Visit    ? thrush    HPI:  Pt is a 83 y.o. female seen today at [facility] for an acute visit for evaluation of whitish coating on the tongue.she is seen in her room today per facility Nurse request.Nurse reports patient's previously referred to ENT but son did not make appointment post hospital discharge 03/31/2017 wonders if ENT referral still needed.Patient states tongue coating does not bother her anymore. She states no pain or bleeding. She denies any fever,chills or loss of appetite.      Past Medical History:  Diagnosis Date  . Acquired cyst of  kidney    left  . Allergic rhinitis, cause unspecified   . Anemia, unspecified   . Chronic hyponatremia 09/05/2015  . CKD stage 2 due to type 2 diabetes mellitus ([place]) 11/18/2014  . Closed fracture of unspecified part of vertebral column without mention of spinal cord injury    T7 s/p kyphoplasty, T12  . Edema 2015  . Herpes simplex without mention of complication   . History of Epstein-Barr virus infection   . Hyposmolality and/or hyponatremia    07/13/15 Na 131  . Insomnia   . Insomnia, unspecified   . Left cavernous carotid aneurysm 08/2008   1-1/51mm aneurysm of cavernous carotid artery  . Malignant neoplasm of breast (female), unspecified site 1990   left. Had surgerey and chemo, but no radiation  . 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 Neuropathy   . Osteoarthrosis of knee    bilaterally  . Osteoarthrosis, hip   . Osteoarthrosis, unspecified whether generalized or localized, forearm    bilaterally wrist  . Other and unspecified nonspecific immunological findings    positive ANA  . Other B-complex deficiencies   . Pain in joint, site unspecified    severe, diffuse, chronic pain  . Positive ANA (antinuclear antibody)   . Pulmonary embolism ([place]) 05/07/2015  . Pure hypercholesterolemia   . Senile osteoporosis   . Unspecified essential hypertension   . Unspecified gastritis and gastroduodenitis without mention of hemorrhage   . Unspecified hypothyroidism   . Unspecified vitamin D deficiency  Past Surgical History:  Procedure Laterality Date  . DILATION AND CURETTAGE OF UTERUS  X 2  . FEMUR IM NAIL Right 03/18/2015   Procedure: INTRAMEDULLARY (IM) NAIL FEMORAL;  Surgeon: Rod Can, MD;  Location: [facility] ORS;  Service: Orthopedics;  Laterality: Right;  . FRACTURE SURGERY    . KYPHOPLASTY  07/03/2010   T12  . KYPHOPLASTY     C7  . LAPAROSCOPIC CHOLECYSTECTOMY  09/20/2006  . MASTECTOMY Left 04/29/1989  . MIDDLE EAR SURGERY  Bilateral 1938  . NASAL SINUS SURGERY  1990 & 2000  . ORIF HIP FRACTURE Left 06/13/2007   following a syncopal episode  . TONSILLECTOMY  1968    Allergies  Allergen Reactions  . Aspirin     Drop in body temp with large quantities, can tolerate low doses of aspirin  . Penicillins Swelling    Has patient had a PCN reaction causing immediate rash, facial/tongue/throat swelling, SOB or lightheadedness with hypotension: No Has patient had a PCN reaction causing severe rash involving mucus membranes or skin necrosis: No Has patient had a PCN reaction that required hospitalization No Has patient had a PCN reaction occurring within the last 10 years: No If all of the above answers are "NO", then may proceed with Cephalosporin use.    Outpatient Encounter Medications as of 05/05/2017  Medication Sig  . alum & mag hydroxide-simeth (MINTOX) 200-200-20 MG/5ML suspension Take 5 mLs as needed by mouth for indigestion or heartburn.   Marland Kitchen apixaban (ELIQUIS) 2.5 MG TABS tablet Take 2.5 mg by mouth 2 (two) times daily.  Marland Kitchen atenolol (TENORMIN) 25 MG tablet Take 25 mg by mouth at bedtime.   . calcium citrate (CALCITRATE) 950 MG tablet Take 1 tablet (200 mg of elemental calcium total) by mouth daily.  . cholecalciferol (VITAMIN D) 1000 UNITS tablet Take 3,000 Units by mouth daily.  Marland Kitchen denosumab (PROLIA) 60 MG/ML SOLN injection Inject 60 mg every 6 (six) months into the skin. 10th of sixth month - Administer in upper arm, thigh, or abdomen  . Dextromethorphan-Guaifenesin (ROBAFEN DM) 10-100 MG/5ML liquid Take 10 mLs by mouth every 6 (six) hours as needed.   . doxazosin (CARDURA) 4 MG tablet One daily to help control BP  . feeding supplement (BOOST / RESOURCE BREEZE) LIQD Take 1 Container by mouth 2 (two) times daily between meals.   . fexofenadine (ALLEGRA) 60 MG tablet Take 1 tablet (60 mg total) by mouth 2 (two) times daily.  . hydrALAZINE (APRESOLINE) 50 MG tablet Take 50 mg 3 (three) times daily by mouth.  0600, 1400, 2200 - Hold for SBP <110  . irbesartan (AVAPRO) 300 MG tablet Take 300 mg daily by mouth. Hold for BP <90/60  . levothyroxine (SYNTHROID, LEVOTHROID) 150 MCG tablet Take 150 mcg by mouth daily before breakfast.  . Lidocaine (ASPERCREME LIDOCAINE) 4 % PTCH Apply 3 patches topically once. Apply 1 patch to the left hip, left knee, and the right shoulder in the morning. Remove in the evening.  . Melatonin 3 MG TABS Take 1 tablet (3 mg total) by mouth at bedtime.  Marland Kitchen omeprazole (PRILOSEC) 10 MG capsule Take 1 capsule (10 mg total) by mouth daily.  Marland Kitchen oxyCODONE-acetaminophen (PERCOCET) 7.5-325 MG tablet Take 1 tablet by mouth 4 (four) times daily. Four times daily scheduled and one at midnight as needed.  . polyethylene glycol powder (GLYCOLAX/MIRALAX) powder Take 17 grams in 6 oz water or juice daily to prevent constipation  . Psyllium (METAMUCIL SMOOTH TEXTURE) 28.3 % POWD One  tablespoon in 6 oz wsater or juice daily to help prevent constipation  . senna-docusate (SENOKOT S) 8.6-50 MG tablet 2 tablets nightly to prevent constipation  . sodium chloride 1 g tablet 1 tablet daily for hyponatremia  . vitamin B-12 (CYANOCOBALAMIN) 100 MCG tablet Take 1 tablet (100 mcg total) by mouth daily.   No facility-administered encounter medications on file as of 05/05/2017.     Review of Systems  Constitutional: Negative for activity change, appetite change, chills, fatigue and fever.  HENT: Negative for congestion, mouth sores, rhinorrhea, sinus pressure, sinus pain, sneezing, sore throat and trouble swallowing.        Wears dentures.Whitish tongue coating  Respiratory: Negative for cough, chest tightness, shortness of breath and wheezing.   Cardiovascular: Negative for chest pain, palpitations and leg swelling.  Gastrointestinal: Negative for abdominal distention, abdominal pain, constipation, diarrhea, nausea and vomiting.  Endocrine: Negative for polydipsia, polyphagia and polyuria.    Musculoskeletal: Positive for arthralgias and gait problem.  Skin: Negative for color change, pallor, rash and wound.  Psychiatric/Behavioral: Negative for agitation, confusion and sleep disturbance. The patient is not nervous/anxious.     Immunization History  Administered Date(s) Administered  . Influenza Split 02/13/2013  . Influenza-Unspecified 02/27/2014, 02/12/2015, 02/25/2016, 03/08/2017  . PPD Test 09/09/2015  . Pneumococcal Conjugate-13 05/05/2016  . Pneumococcal Polysaccharide-23 02/13/2013  . Td 05/05/2016  . Zoster 05/04/2016   Pertinent  Health Maintenance Due  Topic Date Due  . MAMMOGRAM  09/09/2017 (Originally 03/21/1945)  . INFLUENZA VACCINE  Completed  . DEXA SCAN  Completed  . PNA vac Low Risk Adult  Completed   Fall Risk  01/02/2017 11/10/2015 10/20/2015 10/20/2015 06/30/2015  Falls in the past year? No No Yes No No  Number falls in past yr: - - 2 or more - -  Injury with Fall? - - Yes - -  Risk Factor Category  - - - - -  Risk for fall due to : - - History of fall(s);Impaired balance/gait - -  Follow up - - - - -   Functional Status Survey:    Vitals:   05/05/17 1207  BP: (!) 158/67  Pulse: (!) 58  Resp: 18  Temp: (!) 97.1 F (36.2 C)  SpO2: 97%  Weight: 141 lb 3.2 oz (64 kg)  Height: 5\' 8"  (1.727 m)   Body mass index is 21.47 kg/m. Physical Exam  Constitutional: She is oriented to person, place, and time.  Frail elderly in no acute distress   HENT:  Head: Normocephalic.  Mouth/Throat: No oropharyngeal exudate.  Mid  Tongue whitish coating has improved compared to previous visit.   Eyes: Conjunctivae and EOM are normal. Pupils are equal, round, and reactive to light. Right eye exhibits no discharge. Left eye exhibits no discharge. No scleral icterus.  Neck: Normal range of motion. No thyromegaly present.  Cardiovascular: Normal rate, regular rhythm, normal heart sounds and intact distal pulses. Exam reveals no gallop and no friction rub.  No  murmur heard. Pulmonary/Chest: Effort normal and breath sounds normal. No respiratory distress. She has no wheezes. She has no rales.  Abdominal: Soft. Bowel sounds are normal. She exhibits no distension. There is no tenderness. There is no rebound and no guarding.  Lymphadenopathy:    She has no cervical adenopathy.  Neurological: She is oriented to person, place, and time. Coordination normal.  Skin: Skin is warm and dry. No rash noted. No erythema. No pallor.  Psychiatric: She has a normal mood and affect.  Labs reviewed: Recent Labs    03/30/17 0604 03/30/17 1016 03/31/17 0557 04/03/17  NA 127*  --  127* 127  K 4.0  --  4.1 4.0  CL 97*  --  97* 96  CO2 19*  --  24  --   GLUCOSE 114*  --  96  --   BUN 21*  --  21* 24*  CREATININE 1.03*  --  1.15* 1.12  CALCIUM 8.7*  --  8.4* 8.4  MG  --  2.1  --   --   PHOS  --  2.6  --   --    Recent Labs    02/20/17 03/29/17 03/31/17 0557  AST 15 15 19   ALT 12 11 13*  ALKPHOS 46 47 47  BILITOT  --  0.8 0.9  PROT  --  6.0 6.2*  ALBUMIN  --  3.6 3.3*   Recent Labs    03/29/17 03/30/17 0604 03/31/17 0557  WBC 5.0  5.0 5.2 4.5  HGB 11.5*  11.5 11.8* 11.0*  HCT 33*  33.3 33.2* 32.4*  MCV  --  95.1 97.3  PLT 153 143* 143*   Lab Results  Component Value Date   TSH 1.496 03/30/2017   No results found for: HGBA1C Lab Results  Component Value Date   CHOL 175 11/10/2014   HDL 53 11/10/2014   LDLCALC 87 11/10/2014   TRIG 173 (A) 11/10/2014    Significant Diagnostic Results in last 30 days:  No results found.  Assessment/Plan   Tongue coating Afebrile.Mid tongue whitish coating has improved.ENT previous recommended but POA didn't make an appointment per facility Nurse report. She states coating does not bother her for now.she doesn't want to see a specialist anymore.Encourage to brush tongue three times daily after meals and swish with mouth wash. Discontinue ENT referral.   Family/ staff Communication: Reviewed plan  of care with patient and facility Nurse.   Labs/tests ordered: None   Taila Basinski C Sanjuanita Condrey, NP

## 2017-05-07 DIAGNOSIS — R1311 Dysphagia, oral phase: Secondary | ICD-10-CM | POA: Diagnosis not present

## 2017-05-07 DIAGNOSIS — M6281 Muscle weakness (generalized): Secondary | ICD-10-CM | POA: Diagnosis not present

## 2017-05-07 DIAGNOSIS — G894 Chronic pain syndrome: Secondary | ICD-10-CM | POA: Diagnosis not present

## 2017-05-07 DIAGNOSIS — R29898 Other symptoms and signs involving the musculoskeletal system: Secondary | ICD-10-CM | POA: Diagnosis not present

## 2017-05-07 DIAGNOSIS — R2681 Unsteadiness on feet: Secondary | ICD-10-CM | POA: Diagnosis not present

## 2017-05-07 DIAGNOSIS — S72141A Displaced intertrochanteric fracture of right femur, initial encounter for closed fracture: Secondary | ICD-10-CM | POA: Diagnosis not present

## 2017-05-08 DIAGNOSIS — R1311 Dysphagia, oral phase: Secondary | ICD-10-CM | POA: Diagnosis not present

## 2017-05-08 DIAGNOSIS — G894 Chronic pain syndrome: Secondary | ICD-10-CM | POA: Diagnosis not present

## 2017-05-08 DIAGNOSIS — M6281 Muscle weakness (generalized): Secondary | ICD-10-CM | POA: Diagnosis not present

## 2017-05-08 DIAGNOSIS — R2681 Unsteadiness on feet: Secondary | ICD-10-CM | POA: Diagnosis not present

## 2017-05-08 DIAGNOSIS — R29898 Other symptoms and signs involving the musculoskeletal system: Secondary | ICD-10-CM | POA: Diagnosis not present

## 2017-05-08 DIAGNOSIS — S72141A Displaced intertrochanteric fracture of right femur, initial encounter for closed fracture: Secondary | ICD-10-CM | POA: Diagnosis not present

## 2017-05-10 DIAGNOSIS — R2681 Unsteadiness on feet: Secondary | ICD-10-CM | POA: Diagnosis not present

## 2017-05-10 DIAGNOSIS — S72141A Displaced intertrochanteric fracture of right femur, initial encounter for closed fracture: Secondary | ICD-10-CM | POA: Diagnosis not present

## 2017-05-10 DIAGNOSIS — M6281 Muscle weakness (generalized): Secondary | ICD-10-CM | POA: Diagnosis not present

## 2017-05-10 DIAGNOSIS — G894 Chronic pain syndrome: Secondary | ICD-10-CM | POA: Diagnosis not present

## 2017-05-10 DIAGNOSIS — R29898 Other symptoms and signs involving the musculoskeletal system: Secondary | ICD-10-CM | POA: Diagnosis not present

## 2017-05-10 DIAGNOSIS — R1311 Dysphagia, oral phase: Secondary | ICD-10-CM | POA: Diagnosis not present

## 2017-05-11 DIAGNOSIS — R29898 Other symptoms and signs involving the musculoskeletal system: Secondary | ICD-10-CM | POA: Diagnosis not present

## 2017-05-11 DIAGNOSIS — S72141A Displaced intertrochanteric fracture of right femur, initial encounter for closed fracture: Secondary | ICD-10-CM | POA: Diagnosis not present

## 2017-05-11 DIAGNOSIS — E871 Hypo-osmolality and hyponatremia: Secondary | ICD-10-CM | POA: Diagnosis not present

## 2017-05-11 DIAGNOSIS — R2681 Unsteadiness on feet: Secondary | ICD-10-CM | POA: Diagnosis not present

## 2017-05-11 DIAGNOSIS — R1311 Dysphagia, oral phase: Secondary | ICD-10-CM | POA: Diagnosis not present

## 2017-05-11 DIAGNOSIS — G894 Chronic pain syndrome: Secondary | ICD-10-CM | POA: Diagnosis not present

## 2017-05-11 DIAGNOSIS — M6281 Muscle weakness (generalized): Secondary | ICD-10-CM | POA: Diagnosis not present

## 2017-05-11 LAB — BASIC METABOLIC PANEL
BUN: 21 (ref 4–21)
Creatinine: 1 (ref 0.5–1.1)
Glucose: 98
Potassium: 4.2 (ref 3.4–5.3)
Sodium: 129 — AB (ref 137–147)

## 2017-05-17 DIAGNOSIS — S72001A Fracture of unspecified part of neck of right femur, initial encounter for closed fracture: Secondary | ICD-10-CM | POA: Diagnosis not present

## 2017-05-17 DIAGNOSIS — M1610 Unilateral primary osteoarthritis, unspecified hip: Secondary | ICD-10-CM | POA: Diagnosis not present

## 2017-05-17 DIAGNOSIS — D500 Iron deficiency anemia secondary to blood loss (chronic): Secondary | ICD-10-CM | POA: Diagnosis not present

## 2017-05-17 DIAGNOSIS — R4189 Other symptoms and signs involving cognitive functions and awareness: Secondary | ICD-10-CM | POA: Diagnosis not present

## 2017-05-17 DIAGNOSIS — M810 Age-related osteoporosis without current pathological fracture: Secondary | ICD-10-CM | POA: Diagnosis not present

## 2017-05-17 DIAGNOSIS — R29898 Other symptoms and signs involving the musculoskeletal system: Secondary | ICD-10-CM | POA: Diagnosis not present

## 2017-05-17 DIAGNOSIS — D649 Anemia, unspecified: Secondary | ICD-10-CM | POA: Diagnosis not present

## 2017-05-17 DIAGNOSIS — G894 Chronic pain syndrome: Secondary | ICD-10-CM | POA: Diagnosis not present

## 2017-05-17 DIAGNOSIS — I10 Essential (primary) hypertension: Secondary | ICD-10-CM | POA: Diagnosis not present

## 2017-05-17 DIAGNOSIS — G4700 Insomnia, unspecified: Secondary | ICD-10-CM | POA: Diagnosis not present

## 2017-05-17 DIAGNOSIS — M1991 Primary osteoarthritis, unspecified site: Secondary | ICD-10-CM | POA: Diagnosis not present

## 2017-05-17 DIAGNOSIS — E039 Hypothyroidism, unspecified: Secondary | ICD-10-CM | POA: Diagnosis not present

## 2017-05-17 DIAGNOSIS — S72141A Displaced intertrochanteric fracture of right femur, initial encounter for closed fracture: Secondary | ICD-10-CM | POA: Diagnosis not present

## 2017-05-17 DIAGNOSIS — N182 Chronic kidney disease, stage 2 (mild): Secondary | ICD-10-CM | POA: Diagnosis not present

## 2017-05-17 DIAGNOSIS — Z4789 Encounter for other orthopedic aftercare: Secondary | ICD-10-CM | POA: Diagnosis not present

## 2017-05-17 DIAGNOSIS — R1311 Dysphagia, oral phase: Secondary | ICD-10-CM | POA: Diagnosis not present

## 2017-05-17 DIAGNOSIS — C50919 Malignant neoplasm of unspecified site of unspecified female breast: Secondary | ICD-10-CM | POA: Diagnosis not present

## 2017-05-17 DIAGNOSIS — M6281 Muscle weakness (generalized): Secondary | ICD-10-CM | POA: Diagnosis not present

## 2017-05-17 DIAGNOSIS — E7800 Pure hypercholesterolemia, unspecified: Secondary | ICD-10-CM | POA: Diagnosis not present

## 2017-05-17 DIAGNOSIS — R609 Edema, unspecified: Secondary | ICD-10-CM | POA: Diagnosis not present

## 2017-05-18 DIAGNOSIS — R1311 Dysphagia, oral phase: Secondary | ICD-10-CM | POA: Diagnosis not present

## 2017-05-18 DIAGNOSIS — R4189 Other symptoms and signs involving cognitive functions and awareness: Secondary | ICD-10-CM | POA: Diagnosis not present

## 2017-05-18 DIAGNOSIS — S72141A Displaced intertrochanteric fracture of right femur, initial encounter for closed fracture: Secondary | ICD-10-CM | POA: Diagnosis not present

## 2017-05-18 DIAGNOSIS — R29898 Other symptoms and signs involving the musculoskeletal system: Secondary | ICD-10-CM | POA: Diagnosis not present

## 2017-05-18 DIAGNOSIS — G894 Chronic pain syndrome: Secondary | ICD-10-CM | POA: Diagnosis not present

## 2017-05-18 DIAGNOSIS — M6281 Muscle weakness (generalized): Secondary | ICD-10-CM | POA: Diagnosis not present

## 2017-05-22 DIAGNOSIS — G894 Chronic pain syndrome: Secondary | ICD-10-CM | POA: Diagnosis not present

## 2017-05-22 DIAGNOSIS — S72141A Displaced intertrochanteric fracture of right femur, initial encounter for closed fracture: Secondary | ICD-10-CM | POA: Diagnosis not present

## 2017-05-22 DIAGNOSIS — R1311 Dysphagia, oral phase: Secondary | ICD-10-CM | POA: Diagnosis not present

## 2017-05-22 DIAGNOSIS — M6281 Muscle weakness (generalized): Secondary | ICD-10-CM | POA: Diagnosis not present

## 2017-05-22 DIAGNOSIS — R4189 Other symptoms and signs involving cognitive functions and awareness: Secondary | ICD-10-CM | POA: Diagnosis not present

## 2017-05-22 DIAGNOSIS — R29898 Other symptoms and signs involving the musculoskeletal system: Secondary | ICD-10-CM | POA: Diagnosis not present

## 2017-05-23 ENCOUNTER — Non-Acute Institutional Stay: Payer: Medicare Other | Admitting: Family

## 2017-05-23 ENCOUNTER — Encounter: Payer: Self-pay | Admitting: Family

## 2017-05-23 DIAGNOSIS — R29898 Other symptoms and signs involving the musculoskeletal system: Secondary | ICD-10-CM | POA: Diagnosis not present

## 2017-05-23 DIAGNOSIS — E039 Hypothyroidism, unspecified: Secondary | ICD-10-CM

## 2017-05-23 DIAGNOSIS — R4189 Other symptoms and signs involving cognitive functions and awareness: Secondary | ICD-10-CM | POA: Diagnosis not present

## 2017-05-23 DIAGNOSIS — N183 Chronic kidney disease, stage 3 unspecified: Secondary | ICD-10-CM

## 2017-05-23 DIAGNOSIS — S72141A Displaced intertrochanteric fracture of right femur, initial encounter for closed fracture: Secondary | ICD-10-CM | POA: Diagnosis not present

## 2017-05-23 DIAGNOSIS — E871 Hypo-osmolality and hyponatremia: Secondary | ICD-10-CM

## 2017-05-23 DIAGNOSIS — R1311 Dysphagia, oral phase: Secondary | ICD-10-CM | POA: Diagnosis not present

## 2017-05-23 DIAGNOSIS — M6281 Muscle weakness (generalized): Secondary | ICD-10-CM | POA: Diagnosis not present

## 2017-05-23 DIAGNOSIS — I129 Hypertensive chronic kidney disease with stage 1 through stage 4 chronic kidney disease, or unspecified chronic kidney disease: Secondary | ICD-10-CM

## 2017-05-23 DIAGNOSIS — G894 Chronic pain syndrome: Secondary | ICD-10-CM | POA: Diagnosis not present

## 2017-05-23 NOTE — Progress Notes (Signed)
Location:  [facility] Room Number: 35 Place of Service:  ALF [phone]) Provider: Dinah Ngetich FNP-C   Blanchie Serve, MD  Patient Care Team: Blanchie Serve, MD as PCP - General (Internal Medicine) Ngetich, Nelda Bucks, NP as Nurse Practitioner (Family Medicine)  Extended Emergency Contact Information Primary Emergency Contact: Maiers,[name] Address: [phone] [place]          [place], [place] [address] [name] of [place] Phone: [phone] Mobile Phone: [phone] Relation: Spouse Secondary Emergency Contact: Canady,[name] Address: [address]          [place], [place] [address] [name] of [place] Mobile Phone: [phone] Relation: Son  Code Status:  DNR Goals of care: Advanced Directive information Advanced Directives 05/23/2017  Does Patient Have a Medical Advance Directive? Yes  Type of Paramedic of [place];Out of facility DNR (pink MOST or yellow form);Living will  Does patient want to make changes to medical advance directive? -  Copy of [place] in Chart? Yes  Would patient like information on creating a medical advance directive? -  Pre-existing out of facility DNR order (yellow form or pink MOST form) Yellow form placed in chart (order not valid for inpatient use)     Chief Complaint  Patient presents with  . Medical Management of Chronic Issues    routine visit    HPI:  Pt is a 84 y.o. female seen today [facility] for medical management of chronic diseases.shre has a medical history of HTN,CKD stage 3, hypothyroidism,GERD,OA,Osteoporosis,chronic hyponatremia among other conditions.she is seen in her room today.shedneies any acute issues during visit.she states chronic pain under control with current pain medications. No recent fall episodes or hospitalization since prior visit.She has had no weight changes.she continues to use walker for ambulation and walks on facility  hallway. Facility Nurse reports no new concerns.      Past Medical History:  Diagnosis Date  . Acquired cyst of kidney    left  . Allergic rhinitis, cause unspecified   . Anemia, unspecified   . Chronic hyponatremia 09/05/2015  . CKD stage 2 due to type 2 diabetes mellitus ([place] [place]) 11/18/2014  . Closed fracture of unspecified part of vertebral column without mention of spinal cord injury    T7 s/p kyphoplasty, T12  . Edema 2015  . Herpes simplex without mention of complication   . History of Epstein-Barr virus infection   . Hyposmolality and/or hyponatremia    07/13/15 Na 131  . Insomnia   . Insomnia, unspecified   . Left cavernous carotid aneurysm 08/2008   1-1/23mm aneurysm of cavernous carotid artery  . Malignant neoplasm of breast (female), unspecified site 1990   left. Had surgerey and chemo, but no radiation  . 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 Neuropathy   . Osteoarthrosis of knee    bilaterally  . Osteoarthrosis, hip   . Osteoarthrosis, unspecified whether generalized or localized, forearm    bilaterally wrist  . Other and unspecified nonspecific immunological findings    positive ANA  . Other B-complex deficiencies   . Pain in joint, site unspecified    severe, diffuse, chronic pain  . Positive ANA (antinuclear antibody)   . Pulmonary embolism ([facility]) 05/07/2015  . Pure hypercholesterolemia   . Senile osteoporosis   . Unspecified essential hypertension   . Unspecified gastritis and gastroduodenitis without mention of hemorrhage   . Unspecified hypothyroidism   .  Unspecified vitamin D deficiency    Past Surgical History:  Procedure Laterality Date  . DILATION AND CURETTAGE OF UTERUS  X 2  . FEMUR IM NAIL Right 03/18/2015   Procedure: INTRAMEDULLARY (IM) NAIL FEMORAL;  Surgeon: Rod Can, MD;  Location: [facility];  Service: Orthopedics;  Laterality: Right;  . FRACTURE SURGERY    . KYPHOPLASTY  07/03/2010   T12  .  KYPHOPLASTY     C7  . LAPAROSCOPIC CHOLECYSTECTOMY  09/20/2006  . MASTECTOMY Left 04/29/1989  . MIDDLE EAR SURGERY Bilateral 1938  . NASAL SINUS SURGERY  1990 & 2000  . ORIF HIP FRACTURE Left 06/13/2007   following a syncopal episode  . TONSILLECTOMY  1968    Allergies  Allergen Reactions  . Aspirin     Drop in body temp with large quantities, can tolerate low doses of aspirin  . Penicillins Swelling    Has patient had a PCN reaction causing immediate rash, facial/tongue/throat swelling, SOB or lightheadedness with hypotension: No Has patient had a PCN reaction causing severe rash involving mucus membranes or skin necrosis: No Has patient had a PCN reaction that required hospitalization No Has patient had a PCN reaction occurring within the last 10 years: No If all of the above answers are "NO", then may proceed with Cephalosporin use.    Allergies as of 05/23/2017      Reactions   Aspirin    Drop in body temp with large quantities, can tolerate low doses of aspirin   Penicillins Swelling   Has patient had a PCN reaction causing immediate rash, facial/tongue/throat swelling, SOB or lightheadedness with hypotension: No Has patient had a PCN reaction causing severe rash involving mucus membranes or skin necrosis: No Has patient had a PCN reaction that required hospitalization No Has patient had a PCN reaction occurring within the last 10 years: No If all of the above answers are "NO", then may proceed with Cephalosporin use.      Medication List        Accurate as of 05/23/17  9:13 PM. Always use your most recent med list.          acetaminophen 500 MG tablet Commonly known as:  TYLENOL Take 500 mg by mouth 2 (two) times daily.   atenolol 25 MG tablet Commonly known as:  TENORMIN Take 25 mg by mouth at bedtime.   calcium citrate 950 MG tablet Commonly known as:  CALCITRATE Take 1 tablet (200 mg of elemental calcium total) by mouth daily.   cholecalciferol 1000 units  tablet Commonly known as:  VITAMIN D Take 3,000 Units by mouth daily.   denosumab 60 MG/ML Soln injection Commonly known as:  PROLIA Inject 60 mg every 6 (six) months into the skin. 10th of sixth month - Administer in upper arm, thigh, or abdomen   doxazosin 4 MG tablet Commonly known as:  CARDURA One daily to help control BP   ELIQUIS 2.5 MG Tabs tablet Generic drug:  apixaban Take 2.5 mg by mouth 2 (two) times daily.   feeding supplement Liqd Take 1 Container by mouth 2 (two) times daily between meals.   fexofenadine 60 MG tablet Commonly known as:  ALLEGRA Take 1 tablet (60 mg total) by mouth 2 (two) times daily.   hydrALAZINE 50 MG tablet Commonly known as:  APRESOLINE Take 50 mg 3 (three) times daily by mouth. 0600, 1400, 2200 - Hold for SBP <110   irbesartan 300 MG tablet Commonly known as:  AVAPRO Take 300  mg daily by mouth. Hold for BP <90/60   levothyroxine 150 MCG tablet Commonly known as:  SYNTHROID, LEVOTHROID Take 150 mcg by mouth daily before breakfast.   Lidocaine 4 % Ptch Commonly known as:  ASPERCREME LIDOCAINE Apply 3 patches topically once. Apply 1 patch to the left hip, left knee, and the right shoulder in the morning. Remove in the evening.   Melatonin 3 MG Tabs Take 1 tablet (3 mg total) by mouth at bedtime.   Ehrenberg 200-200-20 MG/5ML suspension Generic drug:  alum & mag hydroxide-simeth Take 5 mLs as needed by mouth for indigestion or heartburn.   omeprazole 10 MG capsule Commonly known as:  PRILOSEC Take 1 capsule (10 mg total) by mouth daily.   oxyCODONE-acetaminophen 7.5-325 MG tablet Commonly known as:  PERCOCET Take 1 tablet by mouth 4 (four) times daily. Four times daily scheduled and one at midnight as needed.   polyethylene glycol powder powder Commonly known as:  GLYCOLAX/MIRALAX Take 17 grams in 6 oz water or juice daily to prevent constipation   Psyllium 28.3 % Powd Commonly known as:  METAMUCIL SMOOTH TEXTURE One  tablespoon in 6 oz wsater or juice daily to help prevent constipation   ROBAFEN DM 10-100 MG/5ML liquid Generic drug:  Dextromethorphan-Guaifenesin Take 10 mLs by mouth every 6 (six) hours as needed.   senna-docusate 8.6-50 MG tablet Commonly known as:  SENOKOT S 2 tablets nightly to prevent constipation   sodium chloride 1 g tablet 1 tablet daily for hyponatremia   vitamin B-12 100 MCG tablet Commonly known as:  CYANOCOBALAMIN Take 1 tablet (100 mcg total) by mouth daily.       Review of Systems  Constitutional: Negative for activity change, appetite change, chills, fatigue and fever.  HENT: Negative for congestion, rhinorrhea, sinus pressure, sinus pain, sneezing and sore throat.   Eyes: Negative for discharge, redness and itching.  Respiratory: Negative for cough, chest tightness, shortness of breath and wheezing.   Cardiovascular: Negative for chest pain, palpitations and leg swelling.  Gastrointestinal: Negative for abdominal distention, abdominal pain, constipation, diarrhea, nausea and vomiting.  Endocrine: Negative for cold intolerance, heat intolerance, polydipsia, polyphagia and polyuria.  Genitourinary: Negative for dysuria, flank pain, frequency and urgency.  Musculoskeletal: Positive for arthralgias and gait problem.  Skin: Negative for color change, pallor and rash.  Neurological: Negative for dizziness, tremors, syncope, light-headedness and headaches.  Hematological: Does not bruise/bleed easily.  Psychiatric/Behavioral: Negative for agitation, confusion and sleep disturbance. The patient is not nervous/anxious.     Immunization History  Administered Date(s) Administered  . Influenza Split 02/13/2013  . Influenza-Unspecified 02/27/2014, 02/12/2015, 02/25/2016, 03/08/2017  . PPD Test 09/09/2015  . Pneumococcal Conjugate-13 05/05/2016  . Pneumococcal Polysaccharide-23 02/13/2013  . Td 05/05/2016  . Zoster 05/04/2016   Pertinent  Health Maintenance Due    Topic Date Due  . MAMMOGRAM  09/09/2017 (Originally 03/21/1945)  . INFLUENZA VACCINE  Completed  . DEXA SCAN  Completed  . PNA vac Low Risk Adult  Completed   Fall Risk  01/02/2017 11/10/2015 10/20/2015 10/20/2015 06/30/2015  Falls in the past year? No No Yes No No  Number falls in past yr: - - 2 or more - -  Injury with Fall? - - Yes - -  Risk Factor Category  - - - - -  Risk for fall due to : - - History of fall(s);Impaired balance/gait - -  Follow up - - - - -    Vitals:   05/23/17 1236  BP: Marland Kitchen)  186/70  Pulse: (!) 58  Resp: 18  Temp: 97.6 F (36.4 C)  SpO2: 96%  Weight: 138 lb (62.6 kg)  Height: 5\' 8"  (1.727 m)   Body mass index is 20.98 kg/m. Physical Exam  Constitutional: She is oriented to person, place, and time. She appears well-developed.  Elderly in no acute distress   HENT:  Head: Normocephalic.  Right Ear: External ear normal.  Left Ear: External ear normal.  Mouth/Throat: Oropharynx is clear and moist. No oropharyngeal exudate.  Eyes: Conjunctivae and EOM are normal. Pupils are equal, round, and reactive to light. Right eye exhibits no discharge. Left eye exhibits no discharge. No scleral icterus.  Neck: Normal range of motion. No JVD present. No thyromegaly present.  Cardiovascular: Normal rate, regular rhythm, normal heart sounds and intact distal pulses. Exam reveals no gallop and no friction rub.  No murmur heard. Pulmonary/Chest: Effort normal and breath sounds normal. No respiratory distress. She has no wheezes. She has no rales. She exhibits no tenderness.  Abdominal: Soft. Bowel sounds are normal. She exhibits no distension. There is no tenderness. There is no rebound and no guarding.  Musculoskeletal: She exhibits no edema or tenderness.  Unsteady gait uses FWW.chronic knee and hip pain.Arthritic changes to fingers.   Lymphadenopathy:    She has no cervical adenopathy.  Neurological: She is oriented to person, place, and time. Coordination normal.  Skin:  Skin is warm and dry. No rash noted. No erythema. No pallor.  Psychiatric: She has a normal mood and affect.    Labs reviewed: Recent Labs    03/30/17 0604 03/30/17 1016 03/31/17 0557 04/03/17  NA 127*  --  127* 127  K 4.0  --  4.1 4.0  CL 97*  --  97* 96  CO2 19*  --  24  --   GLUCOSE 114*  --  96  --   BUN 21*  --  21* 24*  CREATININE 1.03*  --  1.15* 1.12  CALCIUM 8.7*  --  8.4* 8.4  MG  --  2.1  --   --   PHOS  --  2.6  --   --    Recent Labs    02/20/17 03/29/17 03/31/17 0557  AST 15 15 19   ALT 12 11 13*  ALKPHOS 46 47 47  BILITOT  --  0.8 0.9  PROT  --  6.0 6.2*  ALBUMIN  --  3.6 3.3*   Recent Labs    03/29/17 03/30/17 0604 03/31/17 0557  WBC 5.0  5.0 5.2 4.5  HGB 11.5*  11.5 11.8* 11.0*  HCT 33*  33.3 33.2* 32.4*  MCV  --  95.1 97.3  PLT 153 143* 143*   Lab Results  Component Value Date   TSH 1.496 03/30/2017   No results found for: HGBA1C Lab Results  Component Value Date   CHOL 175 11/10/2014   HDL 53 11/10/2014   LDLCALC 87 11/10/2014   TRIG 173 (A) 11/10/2014    Significant Diagnostic Results in last 30 days:  No results found.  Assessment/Plan 1. Benign hypertension with CKD (chronic kidney disease) stage III SBP elevated at times but over all B/p in the below 150.Asymptomatic.continue on doxazosin 4 mg tablet daily,hydralazine 50 mg tablet three times daily and avapro 300 mg tablet daily.continue to monitor.check monthly BMP.  2. CKD (chronic kidney disease) stage 3, GFR 30-59 ml/min CR at baseline.Continue to monitor BMP  3. Chronic hyponatremia Na level stable.continue on sodium chloride 1 g tablet  daily.monitor BMP monthly.  4. Hypothyroidism Lab Results  Component Value Date   TSH 1.496 03/30/2017  Continue on levothyroxine 150 mcg tablet daily.monitor TSH level.   Family/ staff Communication: Reviewed plan of care with patient and facility Nurse.  Labs/tests ordered: None   Dinah C Ngetich, NP

## 2017-05-24 DIAGNOSIS — S72141A Displaced intertrochanteric fracture of right femur, initial encounter for closed fracture: Secondary | ICD-10-CM | POA: Diagnosis not present

## 2017-05-24 DIAGNOSIS — R29898 Other symptoms and signs involving the musculoskeletal system: Secondary | ICD-10-CM | POA: Diagnosis not present

## 2017-05-24 DIAGNOSIS — M6281 Muscle weakness (generalized): Secondary | ICD-10-CM | POA: Diagnosis not present

## 2017-05-24 DIAGNOSIS — R4189 Other symptoms and signs involving cognitive functions and awareness: Secondary | ICD-10-CM | POA: Diagnosis not present

## 2017-05-24 DIAGNOSIS — G894 Chronic pain syndrome: Secondary | ICD-10-CM | POA: Diagnosis not present

## 2017-05-24 DIAGNOSIS — R1311 Dysphagia, oral phase: Secondary | ICD-10-CM | POA: Diagnosis not present

## 2017-05-25 DIAGNOSIS — M6281 Muscle weakness (generalized): Secondary | ICD-10-CM | POA: Diagnosis not present

## 2017-05-25 DIAGNOSIS — R1311 Dysphagia, oral phase: Secondary | ICD-10-CM | POA: Diagnosis not present

## 2017-05-25 DIAGNOSIS — R29898 Other symptoms and signs involving the musculoskeletal system: Secondary | ICD-10-CM | POA: Diagnosis not present

## 2017-05-25 DIAGNOSIS — R4189 Other symptoms and signs involving cognitive functions and awareness: Secondary | ICD-10-CM | POA: Diagnosis not present

## 2017-05-25 DIAGNOSIS — G894 Chronic pain syndrome: Secondary | ICD-10-CM | POA: Diagnosis not present

## 2017-05-25 DIAGNOSIS — S72141A Displaced intertrochanteric fracture of right femur, initial encounter for closed fracture: Secondary | ICD-10-CM | POA: Diagnosis not present

## 2017-06-05 ENCOUNTER — Non-Acute Institutional Stay: Payer: Medicare Other | Admitting: Internal Medicine

## 2017-06-05 ENCOUNTER — Encounter: Payer: Self-pay | Admitting: Internal Medicine

## 2017-06-05 VITALS — BP 132/68 | HR 60 | Temp 97.5°F | Resp 16 | Ht 68.0 in | Wt 138.2 lb

## 2017-06-05 DIAGNOSIS — R109 Unspecified abdominal pain: Secondary | ICD-10-CM

## 2017-06-05 DIAGNOSIS — N939 Abnormal uterine and vaginal bleeding, unspecified: Secondary | ICD-10-CM

## 2017-06-05 DIAGNOSIS — R630 Anorexia: Secondary | ICD-10-CM

## 2017-06-05 DIAGNOSIS — I10 Essential (primary) hypertension: Secondary | ICD-10-CM | POA: Diagnosis not present

## 2017-06-05 DIAGNOSIS — K5900 Constipation, unspecified: Secondary | ICD-10-CM

## 2017-06-05 DIAGNOSIS — R110 Nausea: Secondary | ICD-10-CM

## 2017-06-05 DIAGNOSIS — D519 Vitamin B12 deficiency anemia, unspecified: Secondary | ICD-10-CM | POA: Diagnosis not present

## 2017-06-05 LAB — VITAMIN B12: VITAMIN B12: 724

## 2017-06-05 MED ORDER — SENNOSIDES-DOCUSATE SODIUM 8.6-50 MG PO TABS: ORAL_TABLET | ORAL | 5 refills | Status: DC

## 2017-06-05 MED ORDER — DOXAZOSIN MESYLATE 2 MG PO TABS: ORAL_TABLET | ORAL | Status: DC

## 2017-06-05 MED ORDER — OMEPRAZOLE 10 MG PO CPDR: 20.0000 mg | DELAYED_RELEASE_CAPSULE | Freq: Every day | ORAL | 0 refills | Status: DC

## 2017-06-05 MED ORDER — FEXOFENADINE HCL 60 MG PO TABS: 60.0000 mg | ORAL_TABLET | Freq: Every day | ORAL | 0 refills | Status: DC

## 2017-06-05 NOTE — Progress Notes (Signed)
[facility]  Provider: Blanchie Serve MD   Location:  [place] of Service:  Clinic (12)  PCP: Blanchie Serve, MD Patient Care Team: Blanchie Serve, MD as PCP - General (Internal Medicine) Ngetich, Nelda Bucks, NP as Nurse Practitioner (Family Medicine)  Extended Emergency Contact Information Primary Emergency Contact: [name],Bailey Address: [address] [place]          [place], [place] [address] Bailey [name] of [place] Phone: [phone] Mobile Phone: [phone] Relation: Spouse Secondary Emergency Contact: [name],Bailey Address: [address]          [place], [place] [address] Bailey [name] of [place] Phone: [phone] Relation: Son   Goals of Care: Advanced Directive information Advanced Directives 05/23/2017  Does Patient Have a Medical Advance Directive? Yes  Type of Paramedic of [place];Out of facility DNR (pink MOST or yellow form);Living will  Does patient want to make changes to medical advance directive? -  Copy of [place] in Chart? Yes  Would patient like information on creating a medical advance directive? -  Pre-existing out of facility DNR order (yellow form or pink MOST form) Yellow form placed in chart (order not valid for inpatient use)      Chief Complaint  Patient presents with  . Acute Visit    nausea, poor appetite, blood spot in underwear, abdominal cramping    HPI: Patient is a 84 y.o. female seen today for acute visit.  Few weeks back, she noticed blood spot to her underwear. Denies further spotting. Denies vaginal discharge or pelvic pain. She has upcoming appointment with Gyn. She had 5-6 episodes of loose stool 3-4 days back. It lasted for one day, denies vomiting with it. Positive for nausea. Denies blood in stool. On medication review, she is on multiple laxative and stool softener. She has lost her appetite and has nausea in the morning for  1-2 weeks. She is on omeprazole 10 mg daily. Poor appetite- for several weeks. Has nausea as well in the morning. On chart review has lost 3 lbs over 1 month.  Abdominal cramping- intermittent. Has nausea. Had multiple loose stool 1 day few days back.   Past Medical History:  Diagnosis Date  . Acquired cyst of kidney    left  . Allergic rhinitis, cause unspecified   . Anemia, unspecified   . Chronic hyponatremia 09/05/2015  . CKD stage 2 due to type 2 diabetes mellitus ([place]) 11/18/2014  . Closed fracture of unspecified part of vertebral column without mention of spinal cord injury    T7 s/p kyphoplasty, T12  . Edema 2015  . Herpes simplex without mention of complication   . History of Epstein-Barr virus infection   . Hyposmolality and/or hyponatremia    07/13/15 Na 131  . Insomnia   . Insomnia, unspecified   . Left cavernous carotid aneurysm 08/2008   1-1/89mm aneurysm of cavernous carotid artery  . Malignant neoplasm of breast (female), unspecified site 1990   left. Had surgerey and chemo, but no radiation  . 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 Neuropathy   . Osteoarthrosis of knee    bilaterally  . Osteoarthrosis, hip   . Osteoarthrosis, unspecified whether generalized or localized, forearm    bilaterally wrist  . Other and unspecified nonspecific immunological findings    positive ANA  . Other B-complex deficiencies   . Pain  in joint, site unspecified    severe, diffuse, chronic pain  . Positive ANA (antinuclear antibody)   . Pulmonary embolism ([place]) 05/07/2015  . Pure hypercholesterolemia   . Senile osteoporosis   . Unspecified essential hypertension   . Unspecified gastritis and gastroduodenitis without mention of hemorrhage   . Unspecified hypothyroidism   . Unspecified vitamin D deficiency    Past Surgical History:  Procedure Laterality Date  . DILATION AND CURETTAGE OF UTERUS  X 2  . FEMUR IM NAIL Right 03/18/2015    Procedure: INTRAMEDULLARY (IM) NAIL FEMORAL;  Surgeon: Rod Can, MD;  Location: [facility];  Service: Orthopedics;  Laterality: Right;  . FRACTURE SURGERY    . KYPHOPLASTY  07/03/2010   T12  . KYPHOPLASTY     C7  . LAPAROSCOPIC CHOLECYSTECTOMY  09/20/2006  . MASTECTOMY Left 04/29/1989  . MIDDLE EAR SURGERY Bilateral 1938  . NASAL SINUS SURGERY  1990 & 2000  . ORIF HIP FRACTURE Left 06/13/2007   following a syncopal episode  . TONSILLECTOMY  1968    reports that  has never smoked. she has never used smokeless tobacco. She reports that she drinks alcohol. She reports that she does not use drugs. Social History   Socioeconomic History  . Marital status: Married    Spouse name: Not on file  . Number of children: Not on file  . Years of education: Not on file  . Highest education level: Not on file  Social Needs  . Financial resource strain: Not on file  . Food insecurity - worry: Not on file  . Food insecurity - inability: Not on file  . Transportation needs - medical: Not on file  . Transportation needs - non-medical: Not on file  Occupational History  . Occupation: retired Marine scientist  Tobacco Use  . Smoking status: Never Smoker  . Smokeless tobacco: Never Used  Substance and Sexual Activity  . Alcohol use: Yes    Comment: 05/08/2015 "I'll have a glass of white wine q now and then"  . Drug use: No  . Sexual activity: No  Other Topics Concern  . Not on file  Social History Narrative   Lives at [facility] since 10/16/2013, moved from [place]    Married 1968   Never smoked   No alcohol    Exercise walking, walks with cane   POA/Living Will              History reviewed. No pertinent family history.  Health Maintenance  Topic Date Due  . MAMMOGRAM  09/09/2017 (Originally 03/21/1945)  . TETANUS/TDAP  05/05/2026  . INFLUENZA VACCINE  Completed  . DEXA SCAN  Completed  . PNA vac Low Risk Adult  Completed    Allergies  Allergen Reactions  . Aspirin     Drop in body temp  with large quantities, can tolerate low doses of aspirin  . Penicillins Swelling    Has patient had a PCN reaction causing immediate rash, facial/tongue/throat swelling, SOB or lightheadedness with hypotension: No Has patient had a PCN reaction causing severe rash involving mucus membranes or skin necrosis: No Has patient had a PCN reaction that required hospitalization No Has patient had a PCN reaction occurring within the last 10 years: No If all of the above answers are "NO", then may proceed with Cephalosporin use.    Outpatient Encounter Medications as of 06/05/2017  Medication Sig  . acetaminophen (TYLENOL) 325 MG tablet Take 650 mg by mouth daily as needed for mild pain  or fever.  Marland Kitchen acetaminophen (TYLENOL) 500 MG tablet Take 500 mg by mouth 2 (two) times daily.  Marland Kitchen alum & mag hydroxide-simeth (MINTOX) 200-200-20 MG/5ML suspension Take 5 mLs as needed by mouth for indigestion or heartburn.   Marland Kitchen apixaban (ELIQUIS) 2.5 MG TABS tablet Take 2.5 mg by mouth 2 (two) times daily.  Marland Kitchen atenolol (TENORMIN) 25 MG tablet Take 25 mg by mouth at bedtime.   . calcium citrate (CALCITRATE) 950 MG tablet Take 1 tablet (200 mg of elemental calcium total) by mouth daily.  . cholecalciferol (VITAMIN D) 1000 UNITS tablet Take 3,000 Units by mouth daily.  Marland Kitchen denosumab (PROLIA) 60 MG/ML SOLN injection Inject 60 mg every 6 (six) months into the skin. 10th of sixth month - Administer in upper arm, thigh, or abdomen  . doxazosin (CARDURA) 2 MG tablet One daily to help control BP  . feeding supplement (BOOST / RESOURCE BREEZE) LIQD Take 1 Container by mouth 2 (two) times daily between meals.   . fexofenadine (ALLEGRA) 60 MG tablet Take 1 tablet (60 mg total) by mouth daily.  . hydrALAZINE (APRESOLINE) 50 MG tablet Take 50 mg 3 (three) times daily by mouth. 0600, 1400, 2200 - Hold for SBP <110  . irbesartan (AVAPRO) 300 MG tablet Take 300 mg daily by mouth. Hold for BP <90/60  . levothyroxine (SYNTHROID, LEVOTHROID) 150  MCG tablet Take 150 mcg by mouth daily before breakfast.  . Lidocaine (ASPERCREME LIDOCAINE) 4 % PTCH Apply 3 patches topically once. Apply 1 patch to the left hip, left knee, and the right shoulder in the morning. Remove in the evening.  . Melatonin 3 MG TABS Take 1 tablet (3 mg total) by mouth at bedtime.  Marland Kitchen omeprazole (PRILOSEC) 10 MG capsule Take 2 capsules (20 mg total) by mouth daily.  Marland Kitchen oxyCODONE-acetaminophen (PERCOCET) 7.5-325 MG tablet Take 1 tablet by mouth 4 (four) times daily. Four times daily scheduled and one at midnight as needed.  . Psyllium (METAMUCIL SMOOTH TEXTURE) 28.3 % POWD One tablespoon in 6 oz wsater or juice daily to help prevent constipation  . senna-docusate (SENOKOT S) 8.6-50 MG tablet 1 tablet nightly to prevent constipation  . sodium chloride 1 g tablet 1 tablet daily for hyponatremia  . vitamin B-12 (CYANOCOBALAMIN) 100 MCG tablet Take 1 tablet (100 mcg total) by mouth daily.  . [DISCONTINUED] Dextromethorphan-Guaifenesin (ROBAFEN DM) 10-100 MG/5ML liquid Take 10 mLs by mouth every 6 (six) hours as needed.   . [DISCONTINUED] doxazosin (CARDURA) 4 MG tablet One daily to help control BP  . [DISCONTINUED] fexofenadine (ALLEGRA) 60 MG tablet Take 1 tablet (60 mg total) by mouth 2 (two) times daily.  . [DISCONTINUED] omeprazole (PRILOSEC) 10 MG capsule Take 1 capsule (10 mg total) by mouth daily.  . [DISCONTINUED] polyethylene glycol powder (GLYCOLAX/MIRALAX) powder Take 17 grams in 6 oz water or juice daily to prevent constipation  . [DISCONTINUED] senna-docusate (SENOKOT S) 8.6-50 MG tablet 2 tablets nightly to prevent constipation   No facility-administered encounter medications on file as of 06/05/2017.     Review of Systems  Constitutional: Positive for appetite change and chills. Negative for fatigue and fever.  HENT: Negative for congestion and rhinorrhea.   Respiratory: Negative for cough, shortness of breath and wheezing.   Cardiovascular: Positive for leg  swelling. Negative for chest pain and palpitations.  Gastrointestinal: Positive for abdominal pain and nausea. Negative for abdominal distention, blood in stool, constipation, rectal pain and vomiting.  Genitourinary: Negative for difficulty urinating, dyspareunia, flank pain,  hematuria and pelvic pain.  Musculoskeletal: Positive for arthralgias, back pain and gait problem.  Neurological: Negative for dizziness and headaches.  Psychiatric/Behavioral: Negative for behavioral problems.    Vitals:   06/05/17 1541  BP: 132/68  Pulse: 60  Resp: 16  Temp: (!) 97.5 F (36.4 C)  TempSrc: Oral  SpO2: 99%  Weight: 138 lb 3.2 oz (62.7 kg)  Height: 5\' 8"  (1.727 m)   Body mass index is 21.01 kg/m.   Wt Readings from Last 3 Encounters:  06/05/17 138 lb 3.2 oz (62.7 kg)  05/23/17 138 lb (62.6 kg)  05/05/17 141 lb 3.2 oz (64 kg)   Physical Exam  Constitutional: She is oriented to person, place, and time. She appears well-developed and well-nourished. No distress.  HENT:  Head: Normocephalic and atraumatic.  Mouth/Throat: Oropharynx is clear and moist.  Eyes: Conjunctivae and EOM are normal. Pupils are equal, round, and reactive to light.  Neck: Normal range of motion. Neck supple.  Cardiovascular: Normal rate and regular rhythm.  Pulmonary/Chest: Effort normal and breath sounds normal. No respiratory distress. She has no wheezes.  Abdominal: Soft. Bowel sounds are normal. There is no tenderness. There is no rebound and no guarding.  Musculoskeletal: She exhibits edema and deformity.  Lymphadenopathy:    She has no cervical adenopathy.  Neurological: She is alert and oriented to person, place, and time.  Skin: Skin is warm and dry. No rash noted. She is not diaphoretic.  Psychiatric: She has a normal mood and affect.    Labs reviewed: Basic Metabolic Panel: Recent Labs    03/30/17 0604 03/30/17 1016 03/31/17 0557 04/03/17 05/11/17  NA 127*  --  127* 127 129*  K 4.0  --  4.1 4.0  4.2  CL 97*  --  97* 96  --   CO2 19*  --  24  --   --   GLUCOSE 114*  --  96  --   --   BUN 21*  --  21* 24* 21  CREATININE 1.03*  --  1.15* 1.12 1.0  CALCIUM 8.7*  --  8.4* 8.4  --   MG  --  2.1  --   --   --   PHOS  --  2.6  --   --   --    Liver Function Tests: Recent Labs    02/20/17 03/29/17 03/31/17 0557  AST 15 15 19   ALT 12 11 13*  ALKPHOS 46 47 47  BILITOT  --  0.8 0.9  PROT  --  6.0 6.2*  ALBUMIN  --  3.6 3.3*   No results for input(s): LIPASE, AMYLASE in the last 8760 hours. No results for input(s): AMMONIA in the last 8760 hours. CBC: Recent Labs    03/29/17 03/30/17 0604 03/31/17 0557  WBC 5.0  5.0 5.2 4.5  HGB 11.5*  11.5 11.8* 11.0*  HCT 33*  33.3 33.2* 32.4*  MCV  --  95.1 97.3  PLT 153 143* 143*   Cardiac Enzymes: Recent Labs    03/30/17 0949 03/30/17 1545 03/30/17 2113  TROPONINI 0.04* 0.06* 0.04*   BNP: Invalid input(s): POCBNP No results found for: HGBA1C Lab Results  Component Value Date   TSH 1.496 03/30/2017   Lab Results  Component Value Date   VITAMINB12 1,076 02/16/2017   Lab Results  Component Value Date   FOLATE 23.4 09/05/2015   Lab Results  Component Value Date   IRON 75 09/05/2015   TIBC 265 09/05/2015  FERRITIN 124 09/05/2015    Lipid Panel: No results for input(s): CHOL, HDL, LDLCALC, TRIG, CHOLHDL, LDLDIRECT in the last 8760 hours. No results found for: HGBA1C  Procedures since last visit: No results found.  Assessment/Plan  1. Essential hypertension Overall stable BP on chart review. Continue hydralazine, irbesartan and atenolol current regimen. With her age, decrease cardura to 2 mg daily considering side effects. - doxazosin (CARDURA) 2 MG tablet; One daily to help control BP  2. Vaginal spotting None further. Gyn visit pending.   3. Constipation, unspecified constipation type Stable, concern for overmedication with her episodes of loose stool. D/c miralax and reduce senokot s to 1 tab daily  from 2 tab daily. Monitor her bowel movement.  - senna-docusate (SENOKOT S) 8.6-50 MG tablet; 1 tablet nightly to prevent constipation  Dispense: 60 tablet; Refill: 5  4. Nausea without vomiting Concern for GERD worsening vs side effect from polypharmacy. Change omeprazole to 20 mg daily for now. Decrease doxazosin dosing. Decrease allegra dosing. Change bowel regimen as above and monitor.   5. Poor appetite Decrease doxazosin. TSH stable. Increase PPI dosing. Weight loss of 3 lbs on review. Monitor.   6. Abdominal cramping Her laxative and stool softener dose adjusted with concern for this contributing to symptoms. reassess    Labs/tests ordered:  Bmp pending from this am per pt.   Next appointment: 2 weeks to reassess her symptoms  Communication: reviewed care plan with patient and her son and charge nurse.     Blanchie Serve, MD Internal Medicine [facility] [address] [place], [place] [address] Cell Phone (Monday-Friday 8 am - 5 pm): [phone] On Call: [phone] and follow prompts after 5 pm and on weekends Office Phone: [phone] Office Fax: [phone]

## 2017-06-06 ENCOUNTER — Encounter: Payer: Self-pay | Admitting: *Deleted

## 2017-06-06 LAB — VITAMIN B12: Vitamin B-12: 724

## 2017-06-07 ENCOUNTER — Encounter: Payer: Self-pay | Admitting: Internal Medicine

## 2017-06-07 ENCOUNTER — Other Ambulatory Visit: Payer: Self-pay | Admitting: Obstetrics and Gynecology

## 2017-06-07 DIAGNOSIS — N952 Postmenopausal atrophic vaginitis: Secondary | ICD-10-CM | POA: Diagnosis not present

## 2017-06-07 DIAGNOSIS — R1084 Generalized abdominal pain: Secondary | ICD-10-CM | POA: Diagnosis not present

## 2017-06-07 DIAGNOSIS — N950 Postmenopausal bleeding: Secondary | ICD-10-CM | POA: Diagnosis not present

## 2017-06-07 DIAGNOSIS — Z01419 Encounter for gynecological examination (general) (routine) without abnormal findings: Secondary | ICD-10-CM | POA: Diagnosis not present

## 2017-06-07 DIAGNOSIS — R9389 Abnormal findings on diagnostic imaging of other specified body structures: Secondary | ICD-10-CM | POA: Diagnosis not present

## 2017-06-08 ENCOUNTER — Encounter: Payer: Self-pay | Admitting: Internal Medicine

## 2017-06-15 ENCOUNTER — Encounter: Payer: Self-pay | Admitting: Internal Medicine

## 2017-06-15 DIAGNOSIS — D649 Anemia, unspecified: Secondary | ICD-10-CM | POA: Diagnosis not present

## 2017-06-15 DIAGNOSIS — I10 Essential (primary) hypertension: Secondary | ICD-10-CM | POA: Diagnosis not present

## 2017-06-15 DIAGNOSIS — N950 Postmenopausal bleeding: Secondary | ICD-10-CM | POA: Diagnosis not present

## 2017-06-15 DIAGNOSIS — E7800 Pure hypercholesterolemia, unspecified: Secondary | ICD-10-CM | POA: Diagnosis not present

## 2017-06-15 DIAGNOSIS — R9389 Abnormal findings on diagnostic imaging of other specified body structures: Secondary | ICD-10-CM | POA: Diagnosis not present

## 2017-06-15 DIAGNOSIS — R609 Edema, unspecified: Secondary | ICD-10-CM | POA: Diagnosis not present

## 2017-06-15 DIAGNOSIS — N182 Chronic kidney disease, stage 2 (mild): Secondary | ICD-10-CM | POA: Diagnosis not present

## 2017-06-15 DIAGNOSIS — Z1231 Encounter for screening mammogram for malignant neoplasm of breast: Secondary | ICD-10-CM | POA: Diagnosis not present

## 2017-06-19 ENCOUNTER — Non-Acute Institutional Stay: Payer: Medicare Other | Admitting: Internal Medicine

## 2017-06-19 ENCOUNTER — Encounter: Payer: Self-pay | Admitting: Internal Medicine

## 2017-06-19 VITALS — BP 128/68 | HR 64 | Temp 97.5°F | Resp 16 | Ht 68.0 in | Wt 135.6 lb

## 2017-06-19 DIAGNOSIS — M159 Polyosteoarthritis, unspecified: Secondary | ICD-10-CM | POA: Diagnosis not present

## 2017-06-19 DIAGNOSIS — K219 Gastro-esophageal reflux disease without esophagitis: Secondary | ICD-10-CM | POA: Diagnosis not present

## 2017-06-19 DIAGNOSIS — K5909 Other constipation: Secondary | ICD-10-CM

## 2017-06-19 DIAGNOSIS — N9489 Other specified conditions associated with female genital organs and menstrual cycle: Secondary | ICD-10-CM | POA: Diagnosis not present

## 2017-06-19 DIAGNOSIS — I10 Essential (primary) hypertension: Secondary | ICD-10-CM

## 2017-06-19 DIAGNOSIS — I2699 Other pulmonary embolism without acute cor pulmonale: Secondary | ICD-10-CM | POA: Diagnosis not present

## 2017-06-19 DIAGNOSIS — J3089 Other allergic rhinitis: Secondary | ICD-10-CM

## 2017-06-19 MED ORDER — DOXAZOSIN MESYLATE 1 MG PO TABS: ORAL_TABLET | ORAL | Status: DC

## 2017-06-19 MED ORDER — LORATADINE 10 MG PO TABS: 10.0000 mg | ORAL_TABLET | Freq: Every day | ORAL | 11 refills | Status: AC

## 2017-06-19 NOTE — Patient Instructions (Signed)
  I have reduced your cardura to 1 mg daily for 1 week and will stop it. With few high BP readings, I am increasing your hydralazine to 50 mg four times a day as we are reducing and discontinuing your cardura.   With your allergy symptom, I have started loratadine 10 mg daily for now.   I have received a note from your Gyn office and will provide clearance note.   Lab work has been ordered in 2 weeks.

## 2017-06-19 NOTE — Progress Notes (Signed)
[facility]  Provider: Blanchie Serve MD   Location:  [facility] of Service:  Clinic (12)  PCP: Blanchie Serve, MD Patient Care Team: Blanchie Serve, MD as PCP - General (Internal Medicine) Ngetich, Nelda Bucks, NP as Nurse Practitioner (Family Medicine)  Extended Emergency Contact Information Primary Emergency Contact: Galluzzo,[name] Address: [address] [place]          [place], [place] [address] [name] of [place] Phone: [phone] Mobile Phone: [phone] Relation: Spouse Secondary Emergency Contact: Picardi,[name] Address: [address]          [place], [place] [address] [name] of [employer] Phone: [phone] Relation: Son  Code Status: DNR  Goals of Care: Advanced Directive information Advanced Directives 05/23/2017  Does Patient Have a Medical Advance Directive? Yes  Type of Paramedic of [place];Out of facility DNR (pink MOST or yellow form);Living will  Does patient want to make changes to medical advance directive? -  Copy of [place] in Chart? Yes  Would patient like information on creating a medical advance directive? -  Pre-existing out of facility DNR order (yellow form or pink MOST form) Yellow form placed in chart (order not valid for inpatient use)      Chief Complaint  Patient presents with  . Acute Visit    follow up on nausea, BP, appetite, allergies  . Medication Refill    No refills needed at this time    HPI: Patient is a 84 y.o. female seen today for follow up on nausea and poor appetite. Appetite has improved. She does not like taste of the food though.  She is seeing Gyn for her vaginal spotting, and a ? Polyp/fibroid have been found. She is pending hysteroscopy and D&C to assess this further. Reduction in bowel regimen has helped with her bowel movement. She is tolerating omeprazole 20 mg daily well for now. Her knee pain has been  bothering her. She would like her med adjusted. She has been having symptom of allergies again since stopping allegra.   Past Medical History:  Diagnosis Date  . Acquired cyst of kidney    left  . Allergic rhinitis, cause unspecified   . Anemia, unspecified   . Chronic hyponatremia 09/05/2015  . CKD stage 2 due to type 2 diabetes mellitus ([place]) 11/18/2014  . Closed fracture of unspecified part of vertebral column without mention of spinal cord injury    T7 s/p kyphoplasty, T12  . Edema 2015  . Herpes simplex without mention of complication   . History of Epstein-Barr virus infection   . Hyposmolality and/or hyponatremia    07/13/15 Na 131  . Insomnia   . Insomnia, unspecified   . Left cavernous carotid aneurysm 08/2008   1-1/75mm aneurysm of cavernous carotid artery  . Malignant neoplasm of breast (female), unspecified site 1990   left. Had surgerey and chemo, but no radiation  . 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 Neuropathy   . Osteoarthrosis of knee    bilaterally  . Osteoarthrosis, hip   . Osteoarthrosis, unspecified whether generalized or localized, forearm    bilaterally wrist  . Other and unspecified nonspecific immunological findings    positive ANA  . Other B-complex deficiencies   . Pain in joint, site unspecified    severe, diffuse, chronic pain  . Positive ANA (antinuclear antibody)   .  Pulmonary embolism ([place]) 05/07/2015  . Pure hypercholesterolemia   . Senile osteoporosis   . Unspecified essential hypertension   . Unspecified gastritis and gastroduodenitis without mention of hemorrhage   . Unspecified hypothyroidism   . Unspecified vitamin D deficiency    Past Surgical History:  Procedure Laterality Date  . DILATION AND CURETTAGE OF UTERUS  X 2  . FEMUR IM NAIL Right 03/18/2015   Procedure: INTRAMEDULLARY (IM) NAIL FEMORAL;  Surgeon: Rod Can, MD;  Location: [facility];  Service: Orthopedics;  Laterality: Right;   . FRACTURE SURGERY    . KYPHOPLASTY  07/03/2010   T12  . KYPHOPLASTY     C7  . LAPAROSCOPIC CHOLECYSTECTOMY  09/20/2006  . MASTECTOMY Left 04/29/1989  . MIDDLE EAR SURGERY Bilateral 1938  . NASAL SINUS SURGERY  1990 & 2000  . ORIF HIP FRACTURE Left 06/13/2007   following a syncopal episode  . TONSILLECTOMY  1968    reports that  has never smoked. she has never used smokeless tobacco. She reports that she drinks alcohol. She reports that she does not use drugs. Social History   Socioeconomic History  . Marital status: Married    Spouse name: Not on file  . Number of children: Not on file  . Years of education: Not on file  . Highest education level: Not on file  Social Needs  . Financial resource strain: Not on file  . Food insecurity - worry: Not on file  . Food insecurity - inability: Not on file  . Transportation needs - medical: Not on file  . Transportation needs - non-medical: Not on file  Occupational History  . Occupation: retired Marine scientist  Tobacco Use  . Smoking status: Never Smoker  . Smokeless tobacco: Never Used  Substance and Sexual Activity  . Alcohol use: Yes    Comment: 05/08/2015 "I'll have a glass of white wine q now and then"  . Drug use: No  . Sexual activity: No  Other Topics Concern  . Not on file  Social History Narrative   Lives at [facility] since 10/16/2013, moved from [place]    Married 1968   Never smoked   No alcohol    Exercise walking, walks with cane   POA/Living Will              History reviewed. No pertinent family history.  Health Maintenance  Topic Date Due  . MAMMOGRAM  09/09/2017 (Originally 03/21/1945)  . TETANUS/TDAP  05/05/2026  . INFLUENZA VACCINE  Completed  . DEXA SCAN  Completed  . PNA vac Low Risk Adult  Completed    Allergies  Allergen Reactions  . Aspirin     Drop in body temp with large quantities, can tolerate low doses of aspirin  . Penicillins Swelling    Has patient had a PCN reaction causing immediate rash,  facial/tongue/throat swelling, SOB or lightheadedness with hypotension: No Has patient had a PCN reaction causing severe rash involving mucus membranes or skin necrosis: No Has patient had a PCN reaction that required hospitalization No Has patient had a PCN reaction occurring within the last 10 years: No If all of the above answers are "NO", then may proceed with Cephalosporin use.    Outpatient Encounter Medications as of 06/19/2017  Medication Sig  . acetaminophen (TYLENOL) 325 MG tablet Take 325 mg by mouth daily as needed for mild pain or fever.   Marland Kitchen acetaminophen (TYLENOL) 500 MG tablet Take 1,000 mg by mouth 2 (two) times daily.  Marland Kitchen  alum & mag hydroxide-simeth (MINTOX) 200-200-20 MG/5ML suspension Take 5 mLs as needed by mouth for indigestion or heartburn.   Marland Kitchen apixaban (ELIQUIS) 2.5 MG TABS tablet Take 2.5 mg by mouth 2 (two) times daily.  Marland Kitchen atenolol (TENORMIN) 25 MG tablet Take 25 mg by mouth at bedtime.   . calcium citrate (CALCITRATE) 950 MG tablet Take 1 tablet (200 mg of elemental calcium total) by mouth daily.  . cholecalciferol (VITAMIN D) 1000 UNITS tablet Take 3,000 Units by mouth daily.  Marland Kitchen denosumab (PROLIA) 60 MG/ML SOLN injection Inject 60 mg every 6 (six) months into the skin. 10th of sixth month - Administer in upper arm, thigh, or abdomen  . doxazosin (CARDURA) 1 MG tablet One daily to help control BP  . feeding supplement (BOOST / RESOURCE BREEZE) LIQD Take 1 Container by mouth 2 (two) times daily between meals.   . fexofenadine (ALLEGRA) 60 MG tablet Take 1 tablet (60 mg total) by mouth daily.  . hydrALAZINE (APRESOLINE) 50 MG tablet Take 50 mg by mouth 4 (four) times daily. 0600, 1400, 2200 - Hold for SBP <110  . irbesartan (AVAPRO) 300 MG tablet Take 300 mg daily by mouth. Hold for BP <90/60  . levothyroxine (SYNTHROID, LEVOTHROID) 150 MCG tablet Take 150 mcg by mouth daily before breakfast.  . Lidocaine (ASPERCREME LIDOCAINE) 4 % PTCH Apply 3 patches topically once.  Apply 1 patch to the left hip, left knee, and the right shoulder in the morning. Remove in the evening.  . Melatonin 3 MG TABS Take 1 tablet (3 mg total) by mouth at bedtime.  Marland Kitchen omeprazole (PRILOSEC) 10 MG capsule Take 2 capsules (20 mg total) by mouth daily.  Marland Kitchen oxyCODONE-acetaminophen (PERCOCET) 7.5-325 MG tablet Take 1 tablet by mouth 4 (four) times daily. Four times daily scheduled and one at midnight as needed.  . promethazine (PHENERGAN) 25 MG tablet Take 25 mg by mouth every 6 (six) hours as needed for nausea or vomiting.  . Psyllium (METAMUCIL SMOOTH TEXTURE) 28.3 % POWD One tablespoon in 6 oz wsater or juice daily to help prevent constipation  . senna-docusate (SENOKOT S) 8.6-50 MG tablet 1 tablet nightly to prevent constipation  . sodium chloride 1 g tablet 1 tablet daily for hyponatremia  . vitamin B-12 (CYANOCOBALAMIN) 100 MCG tablet Take 1 tablet (100 mcg total) by mouth daily.  . [DISCONTINUED] doxazosin (CARDURA) 2 MG tablet One daily to help control BP   No facility-administered encounter medications on file as of 06/19/2017.     Review of Systems  Constitutional: Negative for appetite change, chills and fever.  HENT: Positive for congestion and rhinorrhea.        She feels she needs some thing for her allergies. She was taken off allegra last week.   Eyes: Positive for itching. Negative for discharge.  Respiratory: Positive for cough. Negative for shortness of breath.        Cough with clear mucus  Gastrointestinal: Positive for abdominal pain. Negative for constipation, diarrhea, nausea and vomiting.       Lower abdominal pain with occasional cramping.  Genitourinary: Negative for dysuria, vaginal bleeding and vaginal discharge.  Musculoskeletal: Positive for arthralgias.       Unsteady gait, on wheelchair, both her knees have been bothering her.     Vitals:   06/19/17 1608  BP: 128/68  Pulse: 64  Resp: 16  Temp: (!) 97.5 F (36.4 C)  TempSrc: Oral  SpO2: 99%    Weight: 135 lb 9.6 oz (61.5  kg)  Height: 5\' 8"  (1.727 m)   Body mass index is 20.62 kg/m.   Wt Readings from Last 3 Encounters:  06/19/17 135 lb 9.6 oz (61.5 kg)  06/05/17 138 lb 3.2 oz (62.7 kg)  05/23/17 138 lb (62.6 kg)   Physical Exam  Constitutional: She is oriented to person, place, and time.  Thin built, frail, in no distress   HENT:  Head: Normocephalic and atraumatic.  Mouth/Throat: Oropharynx is clear and moist.  Eyes: Conjunctivae and EOM are normal. Pupils are equal, round, and reactive to light. Right eye exhibits no discharge. Left eye exhibits no discharge.  Neck: Neck supple.  Cardiovascular: Normal rate and regular rhythm.  Pulmonary/Chest: Effort normal and breath sounds normal. She has no wheezes. She has no rales. She exhibits no tenderness.  Abdominal: Soft. Bowel sounds are normal. There is no tenderness.  Musculoskeletal: She exhibits edema and deformity.  Uses wheelchair to ambulate. Unsteady gait  Lymphadenopathy:    She has no cervical adenopathy.  Neurological: She is alert and oriented to person, place, and time.  Skin: Skin is warm and dry. No rash noted. She is not diaphoretic.  Psychiatric: She has a normal mood and affect.    Labs reviewed: Basic Metabolic Panel: Recent Labs    03/30/17 0604 03/30/17 1016 03/31/17 0557 04/03/17 05/11/17  NA 127*  --  127* 127 129*  K 4.0  --  4.1 4.0 4.2  CL 97*  --  97* 96  --   CO2 19*  --  24  --   --   GLUCOSE 114*  --  96  --   --   BUN 21*  --  21* 24* 21  CREATININE 1.03*  --  1.15* 1.12 1.0  CALCIUM 8.7*  --  8.4* 8.4  --   MG  --  2.1  --   --   --   PHOS  --  2.6  --   --   --    Liver Function Tests: Recent Labs    02/20/17 03/29/17 03/31/17 0557  AST 15 15 19   ALT 12 11 13*  ALKPHOS 46 47 47  BILITOT  --  0.8 0.9  PROT  --  6.0 6.2*  ALBUMIN  --  3.6 3.3*   No results for input(s): LIPASE, AMYLASE in the last 8760 hours. No results for input(s): AMMONIA in the last 8760  hours. CBC: Recent Labs    03/29/17 03/30/17 0604 03/31/17 0557  WBC 5.0  5.0 5.2 4.5  HGB 11.5*  11.5 11.8* 11.0*  HCT 33*  33.3 33.2* 32.4*  MCV  --  95.1 97.3  PLT 153 143* 143*   Cardiac Enzymes: Recent Labs    03/30/17 0949 03/30/17 1545 03/30/17 2113  TROPONINI 0.04* 0.06* 0.04*   BNP: Invalid input(s): POCBNP No results found for: HGBA1C Lab Results  Component Value Date   TSH 1.496 03/30/2017   Lab Results  Component Value Date   VITAMINB12 1,076 02/16/2017   Lab Results  Component Value Date   FOLATE 23.4 09/05/2015   Lab Results  Component Value Date   IRON 75 09/05/2015   TIBC 265 09/05/2015   FERRITIN 124 09/05/2015    Lipid Panel: No results for input(s): CHOL, HDL, LDLCALC, TRIG, CHOLHDL, LDLDIRECT in the last 8760 hours. No results found for: HGBA1C  Procedures since last visit: No results found.  Assessment/Plan  1. Essential hypertension Several elevated BP readings. Decrease cardura to 1 mg daily  x 1 week and then stop. Increase hydralazine to 50 mg qid for now. C/w atenolol and irbesartan current dose. Monitor BP. - doxazosin (CARDURA) 1 MG tablet; One daily to help control BP  2. Other pulmonary embolism without acute cor pulmonale, unspecified chronicity (HCC) With history of breast cancer s/p radical mastectomy and new endometrial mass with concern for it being benign vs malignant, continue eliquis 2.5 mg bid for now for chronic anticoagulation  3. GERD without esophagitis C/w omeprazole, has been helping   4. Chronic constipation Continue current bowel regimen, hydration encouraged.   5. Osteoarthritis C/w percocet qid. Increase shceduled tylenol to 1000 mg bid from 500 mg bid. Continue lidocaine patch.   6. Non-seasonal allergic rhinitis, unspecified trigger Off allegra, d/c from med list. Add loratadine 10 mg daily and monitor  7. Endometrial mass With post menopausal vaginal bleed, she was seen by Gyn and plan is to  undergo hysteroscopy with D&C. She is on eliquis with history of PE. Reviewed recent EKG and echocardiogram. Reviewed recent renal function. Reviewed revised cardiac risk index and she has no risk factor, thus rate of major cardiac complication during and post surgery is 0.4-0.5%. She should be able to undergo D&C but will need eliquis held 72 hours prior to the surgery and if no bleeding or complications noted, it can be resumed 48 hours after surgery.     Labs/tests ordered:  Cbc, bmp  Next appointment: 3 months or earlier if needed.  Communication: reviewed care plan with patient.    Blanchie Serve, MD Internal Medicine [facility] [address] [place], [place] [address] Cell Phone (Monday-Friday 8 am - 5 pm): [phone] On Call: [phone] and follow prompts after 5 pm and on weekends Office Phone: [phone] Office Fax: [phone]

## 2017-06-22 ENCOUNTER — Telehealth: Payer: Self-pay | Admitting: *Deleted

## 2017-06-22 ENCOUNTER — Other Ambulatory Visit: Payer: Self-pay | Admitting: Obstetrics and Gynecology

## 2017-06-22 NOTE — Telephone Encounter (Signed)
[facility] for order - this request needs to be sent to NP at facility so order can be placed

## 2017-06-22 NOTE — Telephone Encounter (Signed)
Bailey [name] called from [facility] stating that patient is scheduled for surgery with Dr. Garwin Brothers 2/12 @ 1:30. Dr. Bubba Camp did a clearance for her surgery and indicated that patient's Eliquis needed to be stopped 72 hours prior to surgery. Nurse spoke with the son and he stated for Dr. Bubba Camp to put in orders to the facility for the medication to be held prior to surgery. Son wants the orders placed at the facility.

## 2017-06-22 NOTE — Telephone Encounter (Signed)
Message forwarded to [facility]

## 2017-06-23 NOTE — Telephone Encounter (Signed)
[place] from [facility] called requesting status update on message.   I dicussed conversations within this encounter and offered to fax phone encounter.  Per [place] fax to [phone], phone number [phone]  Telephone encounter faxed as requested

## 2017-06-26 ENCOUNTER — Telehealth: Payer: Self-pay | Admitting: *Deleted

## 2017-06-26 ENCOUNTER — Encounter (HOSPITAL_COMMUNITY): Payer: Self-pay

## 2017-06-26 NOTE — Telephone Encounter (Signed)
PA required for Irbesartan 150mg  2 daily. Completed and approved via Cover My Meds. 300mg  tabs on backorder.

## 2017-06-27 ENCOUNTER — Ambulatory Visit (HOSPITAL_COMMUNITY)
Admission: RE | Admit: 2017-06-27 | Discharge: 2017-06-27 | Disposition: A | Discharge status: Home / Self Care | Payer: Medicare Other | Source: Ambulatory Visit | Attending: Obstetrics and Gynecology | Admitting: Obstetrics and Gynecology

## 2017-06-27 ENCOUNTER — Ambulatory Visit (HOSPITAL_COMMUNITY): Payer: Medicare Other | Admitting: Anesthesiology

## 2017-06-27 ENCOUNTER — Encounter (HOSPITAL_COMMUNITY)
Admission: RE | Disposition: A | Discharge status: Home / Self Care | Payer: Self-pay | Source: Ambulatory Visit | Attending: Obstetrics and Gynecology

## 2017-06-27 ENCOUNTER — Other Ambulatory Visit: Payer: Self-pay

## 2017-06-27 ENCOUNTER — Encounter (HOSPITAL_COMMUNITY): Payer: Self-pay | Admitting: *Deleted

## 2017-06-27 DIAGNOSIS — N182 Chronic kidney disease, stage 2 (mild): Secondary | ICD-10-CM | POA: Insufficient documentation

## 2017-06-27 DIAGNOSIS — Z86711 Personal history of pulmonary embolism: Secondary | ICD-10-CM | POA: Insufficient documentation

## 2017-06-27 DIAGNOSIS — Z880 Allergy status to penicillin: Secondary | ICD-10-CM | POA: Diagnosis not present

## 2017-06-27 DIAGNOSIS — E1122 Type 2 diabetes mellitus with diabetic chronic kidney disease: Secondary | ICD-10-CM | POA: Diagnosis not present

## 2017-06-27 DIAGNOSIS — R9389 Abnormal findings on diagnostic imaging of other specified body structures: Secondary | ICD-10-CM | POA: Diagnosis not present

## 2017-06-27 DIAGNOSIS — Z79899 Other long term (current) drug therapy: Secondary | ICD-10-CM | POA: Insufficient documentation

## 2017-06-27 DIAGNOSIS — Z853 Personal history of malignant neoplasm of breast: Secondary | ICD-10-CM | POA: Insufficient documentation

## 2017-06-27 DIAGNOSIS — N950 Postmenopausal bleeding: Secondary | ICD-10-CM | POA: Diagnosis not present

## 2017-06-27 DIAGNOSIS — E559 Vitamin D deficiency, unspecified: Secondary | ICD-10-CM | POA: Diagnosis not present

## 2017-06-27 DIAGNOSIS — Z886 Allergy status to analgesic agent status: Secondary | ICD-10-CM | POA: Diagnosis not present

## 2017-06-27 DIAGNOSIS — Z7902 Long term (current) use of antithrombotics/antiplatelets: Secondary | ICD-10-CM | POA: Insufficient documentation

## 2017-06-27 DIAGNOSIS — D649 Anemia, unspecified: Secondary | ICD-10-CM | POA: Diagnosis not present

## 2017-06-27 DIAGNOSIS — I129 Hypertensive chronic kidney disease with stage 1 through stage 4 chronic kidney disease, or unspecified chronic kidney disease: Secondary | ICD-10-CM | POA: Diagnosis not present

## 2017-06-27 DIAGNOSIS — E039 Hypothyroidism, unspecified: Secondary | ICD-10-CM | POA: Insufficient documentation

## 2017-06-27 DIAGNOSIS — E7800 Pure hypercholesterolemia, unspecified: Secondary | ICD-10-CM | POA: Insufficient documentation

## 2017-06-27 DIAGNOSIS — N840 Polyp of corpus uteri: Secondary | ICD-10-CM | POA: Diagnosis not present

## 2017-06-27 DIAGNOSIS — G4700 Insomnia, unspecified: Secondary | ICD-10-CM | POA: Insufficient documentation

## 2017-06-27 DIAGNOSIS — N858 Other specified noninflammatory disorders of uterus: Secondary | ICD-10-CM | POA: Diagnosis not present

## 2017-06-27 HISTORY — PX: DILATATION & CURETTAGE/HYSTEROSCOPY WITH MYOSURE: SHX6511

## 2017-06-27 LAB — CBC
HCT: 36.9 % (ref 36.0–46.0)
HEMOGLOBIN: 12.8 g/dL (ref 12.0–15.0)
MCH: 34 pg (ref 26.0–34.0)
MCHC: 34.7 g/dL (ref 30.0–36.0)
MCV: 97.9 fL (ref 78.0–100.0)
Platelets: 172 10*3/uL (ref 150–400)
RBC: 3.77 MIL/uL — AB (ref 3.87–5.11)
RDW: 12 % (ref 11.5–15.5)
WBC: 5.2 10*3/uL (ref 4.0–10.5)

## 2017-06-27 LAB — BASIC METABOLIC PANEL
ANION GAP: 8 (ref 5–15)
BUN: 23 mg/dL — ABNORMAL HIGH (ref 6–20)
CO2: 23 mmol/L (ref 22–32)
Calcium: 8.6 mg/dL — ABNORMAL LOW (ref 8.9–10.3)
Chloride: 97 mmol/L — ABNORMAL LOW (ref 101–111)
Creatinine, Ser: 1.06 mg/dL — ABNORMAL HIGH (ref 0.44–1.00)
GFR, EST AFRICAN AMERICAN: 53 mL/min — AB (ref >60)
GFR, EST NON AFRICAN AMERICAN: 45 mL/min — AB (ref >60)
Glucose, Bld: 114 mg/dL — ABNORMAL HIGH (ref 65–99)
Potassium: 4.2 mmol/L (ref 3.5–5.1)
SODIUM: 128 mmol/L — AB (ref 135–145)

## 2017-06-27 SURGERY — DILATATION & CURETTAGE/HYSTEROSCOPY WITH MYOSURE
Anesthesia: General | Site: Vagina

## 2017-06-27 MED ORDER — SUCCINYLCHOLINE CHLORIDE 20 MG/ML IJ SOLN
INTRAMUSCULAR | Status: DC | PRN
  Administered 2017-06-27: 60 mg via INTRAVENOUS

## 2017-06-27 MED ORDER — HYDRALAZINE HCL 20 MG/ML IJ SOLN
INTRAMUSCULAR | Status: AC
  Administered 2017-06-27: 5 mg via INTRAVENOUS
  Filled 2017-06-27: qty 1

## 2017-06-27 MED ORDER — FENTANYL CITRATE (PF) 100 MCG/2ML IJ SOLN
INTRAMUSCULAR | Status: AC
  Filled 2017-06-27: qty 2

## 2017-06-27 MED ORDER — FENTANYL CITRATE (PF) 100 MCG/2ML IJ SOLN
INTRAMUSCULAR | Status: AC
  Administered 2017-06-27: 25 ug via INTRAVENOUS
  Filled 2017-06-27: qty 2

## 2017-06-27 MED ORDER — SUGAMMADEX SODIUM 200 MG/2ML IV SOLN
INTRAVENOUS | Status: DC | PRN
  Administered 2017-06-27: 130 mg via INTRAVENOUS

## 2017-06-27 MED ORDER — DEXAMETHASONE SODIUM PHOSPHATE 4 MG/ML IJ SOLN
INTRAMUSCULAR | Status: AC
  Filled 2017-06-27: qty 1

## 2017-06-27 MED ORDER — LACTATED RINGERS IV SOLN
INTRAVENOUS | Status: DC
  Administered 2017-06-27: 13:00:00 via INTRAVENOUS

## 2017-06-27 MED ORDER — DEXAMETHASONE SODIUM PHOSPHATE 4 MG/ML IJ SOLN
INTRAMUSCULAR | Status: DC | PRN
  Administered 2017-06-27: 4 mg via INTRAVENOUS

## 2017-06-27 MED ORDER — ROCURONIUM BROMIDE 100 MG/10ML IV SOLN
INTRAVENOUS | Status: AC
  Filled 2017-06-27: qty 1

## 2017-06-27 MED ORDER — HYDRALAZINE HCL 20 MG/ML IJ SOLN
5.0000 mg | Freq: Once | INTRAMUSCULAR | Status: AC
  Administered 2017-06-27: 5 mg via INTRAVENOUS

## 2017-06-27 MED ORDER — PHENYLEPHRINE 40 MCG/ML (10ML) SYRINGE FOR IV PUSH (FOR BLOOD PRESSURE SUPPORT)
PREFILLED_SYRINGE | INTRAVENOUS | Status: AC
  Filled 2017-06-27: qty 10

## 2017-06-27 MED ORDER — LIDOCAINE HCL (CARDIAC) 20 MG/ML IV SOLN
INTRAVENOUS | Status: AC
  Filled 2017-06-27: qty 5

## 2017-06-27 MED ORDER — ONDANSETRON HCL 4 MG/2ML IJ SOLN
INTRAMUSCULAR | Status: DC | PRN
  Administered 2017-06-27: 4 mg via INTRAVENOUS

## 2017-06-27 MED ORDER — FENTANYL CITRATE (PF) 100 MCG/2ML IJ SOLN
INTRAMUSCULAR | Status: DC | PRN
  Administered 2017-06-27 (×4): 25 ug via INTRAVENOUS

## 2017-06-27 MED ORDER — PROPOFOL 10 MG/ML IV BOLUS
INTRAVENOUS | Status: DC | PRN
  Administered 2017-06-27: 40 mg via INTRAVENOUS
  Administered 2017-06-27: 20 mg via INTRAVENOUS
  Administered 2017-06-27: 80 mg via INTRAVENOUS

## 2017-06-27 MED ORDER — FENTANYL CITRATE (PF) 100 MCG/2ML IJ SOLN
25.0000 ug | INTRAMUSCULAR | Status: DC | PRN
  Administered 2017-06-27 (×5): 25 ug via INTRAVENOUS

## 2017-06-27 MED ORDER — PROPOFOL 10 MG/ML IV BOLUS
INTRAVENOUS | Status: AC
  Filled 2017-06-27: qty 20

## 2017-06-27 MED ORDER — ONDANSETRON HCL 4 MG/2ML IJ SOLN
INTRAMUSCULAR | Status: AC
  Filled 2017-06-27: qty 2

## 2017-06-27 MED ORDER — ONDANSETRON HCL 4 MG/2ML IJ SOLN: 4.0000 mg | Freq: Once | INTRAMUSCULAR | Status: DC | PRN

## 2017-06-27 MED ORDER — SUCCINYLCHOLINE CHLORIDE 200 MG/10ML IV SOSY
PREFILLED_SYRINGE | INTRAVENOUS | Status: AC
  Filled 2017-06-27: qty 10

## 2017-06-27 MED ORDER — LIDOCAINE HCL (CARDIAC) 20 MG/ML IV SOLN
INTRAVENOUS | Status: DC | PRN
  Administered 2017-06-27: 60 mg via INTRAVENOUS

## 2017-06-27 MED ORDER — ROCURONIUM BROMIDE 100 MG/10ML IV SOLN
INTRAVENOUS | Status: DC | PRN
  Administered 2017-06-27: 20 mg via INTRAVENOUS

## 2017-06-27 MED ORDER — SUGAMMADEX SODIUM 200 MG/2ML IV SOLN
INTRAVENOUS | Status: AC
  Filled 2017-06-27: qty 2

## 2017-06-27 SURGICAL SUPPLY — 15 items
CANISTER SUCT 3000ML PPV (MISCELLANEOUS) ×3 IMPLANT
CATH ROBINSON RED A/P 16FR (CATHETERS) ×3 IMPLANT
DEVICE MYOSURE LITE (MISCELLANEOUS) IMPLANT
DEVICE MYOSURE REACH (MISCELLANEOUS) IMPLANT
FILTER ARTHROSCOPY CONVERTOR (FILTER) ×3 IMPLANT
GLOVE BIOGEL PI IND STRL 7.0 (GLOVE) ×2 IMPLANT
GLOVE BIOGEL PI INDICATOR 7.0 (GLOVE) ×4
GLOVE ECLIPSE 6.5 STRL STRAW (GLOVE) ×3 IMPLANT
GOWN STRL REUS W/TWL LRG LVL3 (GOWN DISPOSABLE) ×6 IMPLANT
PACK VAGINAL MINOR WOMEN LF (CUSTOM PROCEDURE TRAY) ×3 IMPLANT
PAD OB MATERNITY 4.3X12.25 (PERSONAL CARE ITEMS) ×3 IMPLANT
SEAL ROD LENS SCOPE MYOSURE (ABLATOR) ×3 IMPLANT
TOWEL OR 17X24 6PK STRL BLUE (TOWEL DISPOSABLE) ×6 IMPLANT
TUBING AQUILEX INFLOW (TUBING) ×3 IMPLANT
TUBING AQUILEX OUTFLOW (TUBING) ×3 IMPLANT

## 2017-06-27 NOTE — Anesthesia Procedure Notes (Signed)
Procedure Name: Intubation Date/Time: 06/27/2017 1:40 PM Performed by: Catalina Gravel, MD Pre-anesthesia Checklist: Patient identified, Emergency Drugs available, Suction available and Patient being monitored Patient Re-evaluated:Patient Re-evaluated prior to induction Oxygen Delivery Method: Circle system utilized Preoxygenation: Pre-oxygenation with 100% oxygen Induction Type: IV induction Ventilation: Mask ventilation without difficulty Laryngoscope Size: Mac and 3 Grade View: Grade I Tube type: Oral Tube size: 7.0 mm Number of attempts: 1 Airway Equipment and Method: Stylet Placement Confirmation: ETT inserted through vocal cords under direct vision,  positive ETCO2 and breath sounds checked- equal and bilateral Secured at: 21 cm Tube secured with: Tape Dental Injury: Teeth and Oropharynx as per pre-operative assessment  Comments: LMA unable to seat despite attempts x2. Decision made to place ETT.

## 2017-06-27 NOTE — Discharge Instructions (Signed)
DISCHARGE INSTRUCTIONS: D&C / D&E The following instructions have been prepared to help you care for yourself upon your return home.   Personal hygiene:  Use sanitary pads for vaginal drainage, not tampons.  Shower the day after your procedure.  NO tub baths, pools or Jacuzzis for 2-3 weeks.  Wipe front to back after using the bathroom.  Activity and limitations:  Do NOT drive or operate any equipment for 24 hours. The effects of anesthesia are still present and drowsiness may result.  Do NOT rest in bed all day.  Walking is encouraged.  Walk up and down stairs slowly.  You may resume your normal activity in one to two days or as indicated by your physician.  Sexual activity: NO intercourse for at least 2 weeks after the procedure, or as indicated by your physician.  Diet: Eat a light meal as desired this evening. You may resume your usual diet tomorrow.  Return to work: You may resume your work activities in one to two days or as indicated by your doctor.  What to expect after your surgery: Expect to have vaginal bleeding/discharge for 2-3 days and spotting for up to 10 days. It is not unusual to have soreness for up to 1-2 weeks. You may have a slight burning sensation when you urinate for the first day. Mild cramps may continue for a couple of days. You may have a regular period in 2-6 weeks.  Call your doctor for any of the following:  CALL IF SOAKING A MAXI  PAD EVERY HOUR OR MORE FREQUENTLY  NOTHING PER VAGINA X 1 WK   Pain not relieved by pain medication. CALL  IF TEMP>100.4  Unusual vaginal discharge or odor.

## 2017-06-27 NOTE — H&P (Signed)
Bailey [name] is an 84 y.o. female.P1 widowed WF with PMB and endometrial thickening and associated endom mass on sonohysterogram here for surgical mgmt. Pap nl. Remote hx breast cancer( left)  Pertinent Gynecological History: Menses: post-menopausal Bleeding: post menopausal bleeding Contraception: none DES exposure: denies Blood transfusions: none Sexually transmitted diseases: no past history Previous GYN Procedures: n/a  Last mammogram: normal Date: 2019 Last pap: normal Date: 2019 OB History: G3, P1   Menstrual History: Menarche age: n/a No LMP recorded. Patient is postmenopausal.    Past Medical History:  Diagnosis Date  . Acquired cyst of kidney    left  . Allergic rhinitis, cause unspecified   . Anemia, unspecified   . Chronic hyponatremia 09/05/2015  . CKD stage 2 due to type 2 diabetes mellitus ([place]) 11/18/2014  . Closed fracture of unspecified part of vertebral column without mention of spinal cord injury    T7 s/p kyphoplasty, T12  . Edema 2015  . Herpes simplex without mention of complication   . History of Epstein-Barr virus infection   . Hyposmolality and/or hyponatremia    07/13/15 Na 131  . Insomnia   . Insomnia, unspecified   . Left cavernous carotid aneurysm 08/2008   1-1/35mm aneurysm of cavernous carotid artery  . Malignant neoplasm of breast (female), unspecified site 1990   left. Had surgerey and chemo, but no radiation  . 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 Neuropathy   . Osteoarthrosis of knee    bilaterally  . Osteoarthrosis, hip   . Osteoarthrosis, unspecified whether generalized or localized, forearm    bilaterally wrist  . Other and unspecified nonspecific immunological findings    positive ANA  . Other B-complex deficiencies   . Pain in joint, site unspecified    severe, diffuse, chronic pain  . Positive ANA (antinuclear antibody)   . Pulmonary embolism ([place]) 05/07/2015  . Pure  hypercholesterolemia   . Senile osteoporosis   . Unspecified essential hypertension   . Unspecified gastritis and gastroduodenitis without mention of hemorrhage   . Unspecified hypothyroidism   . Unspecified vitamin D deficiency     Past Surgical History:  Procedure Laterality Date  . BREAST SURGERY Left   . DILATION AND CURETTAGE OF UTERUS  X 2  . FEMUR IM NAIL Right 03/18/2015   Procedure: INTRAMEDULLARY (IM) NAIL FEMORAL;  Surgeon: Rod Can, MD;  Location: [facility];  Service: Orthopedics;  Laterality: Right;  . FRACTURE SURGERY     hip  . KYPHOPLASTY  07/03/2010   T12  . KYPHOPLASTY     C7  . LAPAROSCOPIC CHOLECYSTECTOMY  09/20/2006  . MASTECTOMY Left 04/29/1989  . MIDDLE EAR SURGERY Bilateral 1938  . NASAL SINUS SURGERY  1990 & 2000  . ORIF HIP FRACTURE Left 06/13/2007   following a syncopal episode  . TONSILLECTOMY  1968    History reviewed. No pertinent family history.  Social History:  reports that  has never smoked. she has never used smokeless tobacco. She reports that she drinks alcohol. She reports that she does not use drugs.  Allergies:  Allergies  Allergen Reactions  . Aspirin     Drop in body temp with large quantities, can tolerate low doses of aspirin  . Penicillins Swelling    Has patient had a PCN reaction causing immediate rash, facial/tongue/throat swelling, SOB or lightheadedness with hypotension: No Has patient had a PCN reaction causing severe rash involving mucus membranes or skin necrosis:  No Has patient had a PCN reaction that required hospitalization No Has patient had a PCN reaction occurring within the last 10 years: No If all of the above answers are "NO", then may proceed with Cephalosporin use.    Medications Prior to Admission  Medication Sig Dispense Refill Last Dose  . acetaminophen (TYLENOL) 500 MG tablet Take 1,000 mg by mouth 2 (two) times daily. (0900 & 1600)   06/26/2017 at Unknown time  . alum & mag hydroxide-simeth (MINTOX)  200-200-20 MG/5ML suspension Take 5 mLs as needed by mouth for indigestion or heartburn.    06/26/2017 at Unknown time  . apixaban (ELIQUIS) 2.5 MG TABS tablet Take 2.5 mg by mouth 2 (two) times daily. (0800 & 2000)   06/24/2017 at Unknown time  . atenolol (TENORMIN) 25 MG tablet Take 25 mg by mouth at bedtime. (2000)   06/27/2017 at 0730  . calcium citrate (CALCITRATE) 950 MG tablet Take 1 tablet (200 mg of elemental calcium total) by mouth daily. (Patient taking differently: Take 200 mg of elemental calcium by mouth daily. (0800))   06/26/2017 at Unknown time  . cholecalciferol (VITAMIN D) 1000 UNITS tablet Take 3,000 Units by mouth daily at 12 noon. (1200)   06/26/2017 at Unknown time  . feeding supplement (BOOST / RESOURCE BREEZE) LIQD Take 1 Container by mouth 2 (two) times daily between meals. (1000 & 1600) Prefers Chocolate   06/26/2017 at Unknown time  . fexofenadine (ALLEGRA) 60 MG tablet Take 60 mg by mouth daily. (0800)   06/27/2017 at 0730  . hydrALAZINE (APRESOLINE) 50 MG tablet Take 50 mg by mouth 4 (four) times daily. 0800, 1200, 1600, 2000 - Hold for SBP <110   06/27/2017 at 0730  . irbesartan (AVAPRO) 300 MG tablet Take 300 mg by mouth daily. (0800) Hold for BP <90/60   06/26/2017 at Unknown time  . levothyroxine (SYNTHROID, LEVOTHROID) 150 MCG tablet Take 150 mcg by mouth daily before breakfast. (0730)   06/27/2017 at 0730  . Lidocaine (ASPERCREME LIDOCAINE) 4 % PTCH Apply 3 patches topically once. Apply 1 patch to the left hip, left knee, and the right shoulder in the morning. Remove in the evening. (Patient taking differently: Apply 3 patches topically once. (0800 & 2000) Apply 1 patch to the left hip, left knee, and the right shoulder in the morning. Remove in the evening.) 90 patch 5 06/26/2017 at Unknown time  . loratadine (CLARITIN) 10 MG tablet Take 1 tablet (10 mg total) by mouth daily. (Patient taking differently: Take 10 mg by mouth daily. (0800)) 30 tablet 11 06/27/2017 at 0730  .  Melatonin 3 MG TABS Take 1 tablet (3 mg total) by mouth at bedtime. (Patient taking differently: Take 3 mg by mouth at bedtime. (2000))  0 06/26/2017 at Unknown time  . omeprazole (PRILOSEC) 20 MG capsule Take 20 mg by mouth daily. (0800)   06/27/2017 at 0730  . oxyCODONE-acetaminophen (PERCOCET) 7.5-325 MG tablet Take 1 tablet by mouth 4 (four) times daily. Four times daily scheduled and one at midnight as needed. (Patient taking differently: Take 1 tablet by mouth 4 (four) times daily. (0600,1100, 1600,& 2000) Four times daily scheduled and one at midnight as needed.) 180 tablet 0 06/26/2017 at Unknown time  . polyethylene glycol powder (GLYCOLAX/MIRALAX) powder Take 17 g by mouth daily. (0800)   06/26/2017 at Unknown time  . Psyllium (METAMUCIL SMOOTH TEXTURE) 28.3 % POWD One tablespoon in 6 oz wsater or juice daily to help prevent constipation (Patient taking differently:  Take 15 mLs by mouth daily. (0800) One tablespoon in 6 oz wsater or juice daily to help prevent constipation) 500 g 5 06/26/2017 at Unknown time  . sodium chloride 1 g tablet 1 tablet daily for hyponatremia (Patient taking differently: Take 1 g by mouth daily. (0800)) 30 tablet 0 06/26/2017 at Unknown time  . vitamin B-12 (CYANOCOBALAMIN) 100 MCG tablet Take 1 tablet (100 mcg total) by mouth daily. (Patient taking differently: Take 100 mcg by mouth every morning. (1100)) 30 tablet 0 06/26/2017 at Unknown time  . acetaminophen (TYLENOL) 325 MG tablet Take 325 mg by mouth daily as needed for mild pain or fever.    More than a month at Unknown time  . denosumab (PROLIA) 60 MG/ML SOLN injection Inject 60 mg every 6 (six) months into the skin. 10th of sixth month - Administer in upper arm, thigh, or abdomen   More than a month at Unknown time  . doxazosin (CARDURA) 1 MG tablet One daily to help control BP (Patient not taking: Reported on 06/26/2017)   Not Taking at Unknown time  . omeprazole (PRILOSEC) 10 MG capsule Take 2 capsules (20 mg total)  by mouth daily. (Patient not taking: Reported on 06/26/2017) 30 capsule 0 Not Taking at Unknown time  . promethazine (PHENERGAN) 25 MG suppository Place 25 mg rectally every 6 (six) hours as needed for nausea or vomiting.   Unknown at Unknown time  . promethazine (PHENERGAN) 25 MG tablet Take 25 mg by mouth every 6 (six) hours as needed for nausea or vomiting.   Unknown at Unknown time  . senna-docusate (SENOKOT S) 8.6-50 MG tablet 1 tablet nightly to prevent constipation (Patient taking differently: Take 1 tablet by mouth at bedtime. (2000) Hold for loose stool.) 60 tablet 5     Review of Systems  All other systems reviewed and are negative.   Blood pressure (!) 155/75, pulse (!) 55, temperature (!) 97.5 F (36.4 C), temperature source Oral, resp. rate 14, SpO2 99 %. Physical Exam  Constitutional: She appears well-developed and well-nourished.  HENT:  Head: Atraumatic.  Eyes: EOM are normal.  Neck: Neck supple.  Cardiovascular: Regular rhythm.  Respiratory: Breath sounds normal.  Genitourinary:  Genitourinary Comments: Vaginal atrophy Cervix parous Uterus nl And non palpable  Musculoskeletal: She exhibits no edema.  Skin: Skin is warm and dry.  Psychiatric: She has a normal mood and affect.    Results for orders placed or performed during the hospital encounter of 06/27/17 (from the past 24 hour(s))  Basic metabolic panel     Status: Abnormal   Collection Time: 06/27/17 11:35 AM  Result Value Ref Range   Sodium 128 (L) 135 - 145 mmol/L   Potassium 4.2 3.5 - 5.1 mmol/L   Chloride 97 (L) 101 - 111 mmol/L   CO2 23 22 - 32 mmol/L   Glucose, Bld 114 (H) 65 - 99 mg/dL   BUN 23 (H) 6 - 20 mg/dL   Creatinine, Ser 1.06 (H) 0.44 - 1.00 mg/dL   Calcium 8.6 (L) 8.9 - 10.3 mg/dL   GFR calc non Af Amer 45 (L) >60 mL/min   GFR calc Af Amer 53 (L) >60 mL/min   Anion gap 8 5 - 15  CBC     Status: Abnormal   Collection Time: 06/27/17 11:35 AM  Result Value Ref Range   WBC 5.2 4.0 - 10.5  K/uL   RBC 3.77 (L) 3.87 - 5.11 MIL/uL   Hemoglobin 12.8 12.0 - 15.0 g/dL  HCT 36.9 36.0 - 46.0 %   MCV 97.9 78.0 - 100.0 fL   MCH 34.0 26.0 - 34.0 pg   MCHC 34.7 30.0 - 36.0 g/dL   RDW 12.0 11.5 - 15.5 %   Platelets 172 150 - 400 K/uL    No results found.  Assessment/Plan: PMB  endometrial mass  endometrial thickening for sonogram P) dx hysteroscopy, D&C, resection of endometrial mass. Risk of surgery reviewed including infection, bleeding, uterine perforation and its risk, thermal injury, fluid overload and its risk. ALL ? answered  Jaquaya Coyle A Alf Doyle 06/27/2017, 1:19 PM

## 2017-06-27 NOTE — Transfer of Care (Signed)
Immediate Anesthesia Transfer of Care Note  Patient: Bailey [name]  Procedure(s) Performed: DILATATION & CURETTAGE/HYSTEROSCOPY WITH MYOSURE (N/A Vagina )  Patient Location: PACU  Anesthesia Type:General  Level of Consciousness: awake, alert , oriented and patient cooperative  Airway & Oxygen Therapy: Patient Spontanous Breathing and Patient connected to nasal cannula oxygen  Post-op Assessment: Report given to RN and Post -op Vital signs reviewed and stable  Post vital signs: Reviewed and stable  Last Vitals:  Vitals:   06/27/17 1207  BP: (!) 155/75  Pulse: (!) 55  Resp: 14  Temp: (!) 36.4 C  SpO2: 99%    Last Pain:  Vitals:   06/27/17 1207  TempSrc: Oral  PainSc: 5       Patients Stated Pain Goal: 3 (39/53/20 2334)  Complications: No apparent anesthesia complications

## 2017-06-27 NOTE — Anesthesia Preprocedure Evaluation (Addendum)
Anesthesia Evaluation  Patient identified by MRN, date of birth, ID band Patient awake    Reviewed: Allergy & Precautions, NPO status , Patient's Chart, lab work & pertinent test results, reviewed documented beta blocker date and time   Airway Mallampati: II  TM Distance: <3 FB Neck ROM: Full    Dental  (+) Dental Advisory Given, Lower Dentures, Upper Dentures   Pulmonary PE   Pulmonary exam normal breath sounds clear to auscultation       Cardiovascular hypertension, Pt. on home beta blockers and Pt. on medications + Peripheral Vascular Disease and + DVT  Normal cardiovascular exam Rhythm:Regular Rate:Normal     Neuro/Psych  Neuromuscular disease    GI/Hepatic Neg liver ROS, GERD  Medicated,  Endo/Other  diabetes, Type 2Hypothyroidism   Renal/GU Renal InsufficiencyRenal disease     Musculoskeletal  (+) Arthritis , Osteoarthritis,    Abdominal   Peds  Hematology  (+) Blood dyscrasia (Eliquis), anemia ,   Anesthesia Other Findings Day of surgery medications reviewed with the patient.  H/o breast cancer s/p surgery, chemotherapy  Reproductive/Obstetrics PMP, endometrial mass                           Anesthesia Physical Anesthesia Plan  ASA: III  Anesthesia Plan: General   Post-op Pain Management:    Induction: Intravenous  PONV Risk Score and Plan: 4 or greater and Treatment may vary due to age or medical condition, Dexamethasone and Ondansetron  Airway Management Planned: LMA  Additional Equipment:   Intra-op Plan:   Post-operative Plan: Extubation in OR  Informed Consent: I have reviewed the patients History and Physical, chart, labs and discussed the procedure including the risks, benefits and alternatives for the proposed anesthesia with the patient or authorized representative who has indicated his/her understanding and acceptance.   Dental advisory given  Plan  Discussed with: CRNA  Anesthesia Plan Comments: (Patient with DNR in place. Discussed how to handle during surgery. No CPR, no AED, no prolonged intubation.)       Anesthesia Quick Evaluation

## 2017-06-27 NOTE — Brief Op Note (Signed)
06/27/2017  2:46 PM  PATIENT:  Bailey [name]  84 y.o. female  PRE-OPERATIVE DIAGNOSIS:  Postmenopausal Bleeding, Endometrial Mass  POST-OPERATIVE DIAGNOSIS:  postmenopausal bleeding, endometrial mass  PROCEDURE:  Diagnostic hysteroscopy, hysteroscopic resection of endometrial mass, dilation and curettage  SURGEON:  Surgeon(s) and Role:    * Servando Salina, MD - Primary  PHYSICIAN ASSISTANT:   ASSISTANTS: none   ANESTHESIA:   general Findings: endometrial mass arise off th right vaginal wall. Posterior endometrial thickening, tubal sclerosis, nl endocervical canal EBL:  5 mL  BLOOD ADMINISTERED:none  DRAINS: none   LOCAL MEDICATIONS USED:  NONE  SPECIMEN:  Source of Specimen:  enometrial mass  with endometrial curettage  DISPOSITION OF SPECIMEN:  PATHOLOGY  COUNTS:  YES  TOURNIQUET:  * No tourniquets in log *  DICTATION: .Other Dictation: Dictation Number [id number]  PLAN OF CARE: Discharge to home after PACU  PATIENT DISPOSITION:  PACU - hemodynamically stable.   Delay start of Pharmacological VTE agent (>24hrs) due to surgical blood loss or risk of bleeding: no

## 2017-06-28 ENCOUNTER — Encounter (HOSPITAL_COMMUNITY): Payer: Self-pay | Admitting: Obstetrics and Gynecology

## 2017-06-28 NOTE — Op Note (Signed)
NAME:  Bailey [name], Bailey [name]                       ACCOUNT NO.:  MEDICAL RECORD NO.:  [id number]  LOCATION:                                 FACILITY:  PHYSICIAN:  Servando Salina, M.D.DATE OF BIRTH:  1927-03-18  DATE OF PROCEDURE:  06/27/2017 DATE OF DISCHARGE:                              OPERATIVE REPORT   PREOPERATIVE DIAGNOSES:  Postmenopausal bleeding, endometrial mass.  PROCEDURES:  Diagnostic hysteroscopy, hysteroscopic resection of endometrial mass using MyoSure, dilation and curettage.  POSTOPERATIVE DIAGNOSES:  Postmenopausal bleeding, endometrial mass.  ANESTHESIA:  General.  SURGEON:  Servando Salina, M.D.  ASSISTANT:  None.  DESCRIPTION OF PROCEDURE:  Under adequate general anesthesia, the patient was subsequently placed in a dorsal lithotomy position.  She was sterilely prepped and draped in usual fashion.  The bladder was catheterized for small amount of urine.  Examination under anesthesia revealed anteverted uterus.  No adnexal masses could be appreciated. Bivalve speculum was placed in the vagina.  Atrophy of the vaginal wall was noted.  The Cytotec that the patient had placed was found on the external surface of the vulva.  The cervix was grasped with a single- tooth tenaculum.  Initial attempt at dilating the internal os was unsuccessful and  the os finder that was used was also unsuccessful. A small pratt dilator was used and the larger size did not traverse the internal os.  At that point, decision was then made to introduce the hysteroscope, which allowed me to see the internal os and navigate using that as a reference and entered into the uterine cavity.  It was then noted that deviated to the patient's left, an area that suggestive of where the cervical dilation had initially entered.  On the left of that area was the uterine cavity, which was able to be traversed without incident.  Both tubal ostia were sclerosed.  There was a lesion arising off the  right lateral wall with vascularity and some endometrial thickening posteriorly, more so than the anterior wall.  The Lite resectoscope was then inserted and the mass was resected as was the endometrial wall. Moderate amount of tissue was obtained.  At that point, the procedure was felt to be complete.  The endocervical canal was without any lesion on inspected.  Resectoscope was then removed.  All instruments were then removed from the vagina.  Specimen labeled endometrial mass with curettings was sent to Pathology. Estimated blood loss was 5 mL, Fluid deficit was 600 ml.  Complication was none.  The patient tolerated the procedure well, was transferred to recovery room in stable condition.     Servando Salina, M.D.     [place]/[facility]  D:  06/27/2017  T:  06/28/2017  Job:  [id number]

## 2017-06-28 NOTE — Anesthesia Postprocedure Evaluation (Signed)
Anesthesia Post Note  Patient: Bailey [name]  Procedure(s) Performed: DILATATION & CURETTAGE/HYSTEROSCOPY WITH MYOSURE (N/A Vagina )     Patient location during evaluation: PACU Anesthesia Type: General Level of consciousness: awake and alert Pain management: pain level controlled Vital Signs Assessment: post-procedure vital signs reviewed and stable Respiratory status: spontaneous breathing, nonlabored ventilation and respiratory function stable Cardiovascular status: blood pressure returned to baseline and stable Postop Assessment: no apparent nausea or vomiting Anesthetic complications: no    Last Vitals:  Vitals:   06/27/17 1615 06/27/17 1655  BP: (!) 186/76 (!) 184/83  Pulse: 65 66  Resp: 14 16  Temp:  36.4 C  SpO2: 98% 98%    Last Pain:  Vitals:   06/27/17 1655  TempSrc: Oral  PainSc: 3                  Catalina Gravel

## 2017-07-03 ENCOUNTER — Telehealth: Payer: Self-pay

## 2017-07-03 DIAGNOSIS — E7800 Pure hypercholesterolemia, unspecified: Secondary | ICD-10-CM | POA: Diagnosis not present

## 2017-07-03 DIAGNOSIS — I10 Essential (primary) hypertension: Secondary | ICD-10-CM | POA: Diagnosis not present

## 2017-07-03 DIAGNOSIS — E039 Hypothyroidism, unspecified: Secondary | ICD-10-CM | POA: Diagnosis not present

## 2017-07-03 DIAGNOSIS — D500 Iron deficiency anemia secondary to blood loss (chronic): Secondary | ICD-10-CM | POA: Diagnosis not present

## 2017-07-03 DIAGNOSIS — D649 Anemia, unspecified: Secondary | ICD-10-CM | POA: Diagnosis not present

## 2017-07-03 LAB — CBC AND DIFFERENTIAL
HCT: 34 — AB (ref 36–46)
Hemoglobin: 12 (ref 12.0–16.0)
Platelets: 188 (ref 150–399)
WBC: 6.2

## 2017-07-03 LAB — BASIC METABOLIC PANEL
BUN: 21 (ref 4–21)
Creatinine: 1 (ref 0.5–1.1)
GLUCOSE: 106
POTASSIUM: 4.3 (ref 3.4–5.3)
SODIUM: 130 — AB (ref 137–147)

## 2017-07-03 LAB — HEPATIC FUNCTION PANEL
ALT: 17 (ref 7–35)
AST: 26 (ref 13–35)
Alkaline Phosphatase: 45 (ref 25–125)
BILIRUBIN, TOTAL: 0.8

## 2017-07-03 NOTE — Telephone Encounter (Signed)
Per Dr. Bubba Camp pharmacy has some concerns about some medications that Bailey [name] is taking and wants to speak with the son about it along with the pros and cons of stopping Eliquis.   Patient's son Bailey [name] confirmed appointment for Wednesday 06/17/17 at 11:30 am.

## 2017-07-04 ENCOUNTER — Encounter: Payer: Self-pay | Admitting: Family

## 2017-07-04 ENCOUNTER — Non-Acute Institutional Stay: Payer: Medicare Other | Admitting: Family

## 2017-07-04 DIAGNOSIS — H00011 Hordeolum externum right upper eyelid: Secondary | ICD-10-CM | POA: Diagnosis not present

## 2017-07-04 NOTE — Progress Notes (Addendum)
Location:  [facility] Room Number: 35 Place of Service:  ALF [phone]) Provider: Zamya Culhane FNP-C  Blanchie Serve, MD  Patient Care Team: Blanchie Serve, MD as PCP - General (Internal Medicine) Mendell Bontempo, Nelda Bucks, NP as Nurse Practitioner (Family Medicine)  Extended Emergency Contact Information Primary Emergency Contact: Burgo,[name] Address: [address] [place], [place] [address] [place] Mobile Phone: [phone] Relation: Son  Code Status:  DNR Goals of care: Advanced Directive information Advanced Directives 07/04/2017  Does Patient Have a Medical Advance Directive? Yes  Type of Paramedic of [place];Out of facility DNR (pink MOST or yellow form);Living will  Does patient want to make changes to medical advance directive? -  Copy of [place] in Chart? Yes  Would patient like information on creating a medical advance directive? -  Pre-existing out of facility DNR order (yellow form or pink MOST form) Yellow form placed in chart (order not valid for inpatient use)     Chief Complaint  Patient presents with  . Acute Visit    right eyelid is red and swollen    HPI:  Pt is a 84 y.o. female seen today at [facility] for an acute visit for evaluation of right eyelid redness and swelling.she states eyelid redness and swelling started one day ago.she also reports eye feeling itchy thought it was just her allergies.she denies any fever,chills or cough.She has had no visual impairment.    Past Medical History:  Diagnosis Date  . Acquired cyst of kidney    left  . Allergic rhinitis, cause unspecified   . Anemia, unspecified   . Chronic hyponatremia 09/05/2015  . CKD stage 2 due to type 2 diabetes mellitus ([place]) 11/18/2014  . Closed fracture of unspecified part of vertebral column without mention of spinal cord injury    T7 s/p kyphoplasty, T12  . Edema 2015  . Herpes simplex  without mention of complication   . History of Epstein-Barr virus infection   . Hyposmolality and/or hyponatremia    07/13/15 Na 131  . Insomnia   . Insomnia, unspecified   . Left cavernous carotid aneurysm 08/2008   1-1/31mm aneurysm of cavernous carotid artery  . Malignant neoplasm of breast (female), unspecified site 1990   left. Had surgerey and chemo, but no radiation  . 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 Neuropathy   . Osteoarthrosis of knee    bilaterally  . Osteoarthrosis, hip   . Osteoarthrosis, unspecified whether generalized or localized, forearm    bilaterally wrist  . Other and unspecified nonspecific immunological findings    positive ANA  . Other B-complex deficiencies   . Pain in joint, site unspecified    severe, diffuse, chronic pain  . Positive ANA (antinuclear antibody)   . Pulmonary embolism ([place]) 05/07/2015  . Pure hypercholesterolemia   . Senile osteoporosis   . Unspecified essential hypertension   . Unspecified gastritis and gastroduodenitis without mention of hemorrhage   . Unspecified hypothyroidism   . Unspecified vitamin D deficiency    Past Surgical History:  Procedure Laterality Date  . BREAST SURGERY Left   . DILATATION & CURETTAGE/HYSTEROSCOPY WITH MYOSURE N/A 06/27/2017   Procedure: DILATATION & CURETTAGE/HYSTEROSCOPY WITH MYOSURE;  Surgeon: Servando Salina, MD;  Location: [facility];  Service: Gynecology;  Laterality: N/A;  . DILATION AND CURETTAGE OF UTERUS  X 2  . FEMUR IM NAIL Right 03/18/2015   Procedure: INTRAMEDULLARY (IM) NAIL FEMORAL;  Surgeon: Rod Can, MD;  Location: [facility];  Service: Orthopedics;  Laterality: Right;  . FRACTURE SURGERY     hip  . KYPHOPLASTY  07/03/2010   T12  . KYPHOPLASTY     C7  . LAPAROSCOPIC CHOLECYSTECTOMY  09/20/2006  . MASTECTOMY Left 04/29/1989  . MIDDLE EAR SURGERY Bilateral 1938  . NASAL SINUS SURGERY  1990 & 2000  . ORIF HIP FRACTURE Left 06/13/2007    following a syncopal episode  . TONSILLECTOMY  1968    Allergies  Allergen Reactions  . Aspirin     Drop in body temp with large quantities, can tolerate low doses of aspirin  . Penicillins Swelling    Has patient had a PCN reaction causing immediate rash, facial/tongue/throat swelling, SOB or lightheadedness with hypotension: No Has patient had a PCN reaction causing severe rash involving mucus membranes or skin necrosis: No Has patient had a PCN reaction that required hospitalization No Has patient had a PCN reaction occurring within the last 10 years: No If all of the above answers are "NO", then may proceed with Cephalosporin use.    Outpatient Encounter Medications as of 07/04/2017  Medication Sig  . acetaminophen (TYLENOL) 325 MG tablet Take 325 mg by mouth daily as needed for mild pain or fever.   Marland Kitchen acetaminophen (TYLENOL) 500 MG tablet Take 1,000 mg by mouth 2 (two) times daily. (0900 & 1600)  . acetaminophen (TYLENOL) 500 MG tablet Take 500 mg by mouth 2 (two) times daily.  Marland Kitchen alum & mag hydroxide-simeth (MINTOX) 200-200-20 MG/5ML suspension Take 5 mLs as needed by mouth for indigestion or heartburn.   Marland Kitchen apixaban (ELIQUIS) 2.5 MG TABS tablet Take 2.5 mg by mouth 2 (two) times daily. (0800 & 2000)  . atenolol (TENORMIN) 25 MG tablet Take 25 mg by mouth at bedtime. Hold for SBP <110 or HR <60  . calcium citrate (CALCITRATE) 950 MG tablet Take 1 tablet (200 mg of elemental calcium total) by mouth daily.  . cholecalciferol (VITAMIN D) 1000 UNITS tablet Take 3,000 Units by mouth daily at 12 noon. (1200)  . denosumab (PROLIA) 60 MG/ML SOLN injection Inject 60 mg every 6 (six) months into the skin. 10th of sixth month - Administer in upper arm, thigh, or abdomen  . feeding supplement (BOOST / RESOURCE BREEZE) LIQD Take 1 Container by mouth 2 (two) times daily between meals. (1000 & 1600) Prefers Chocolate  . fexofenadine (ALLEGRA) 60 MG tablet Take 60 mg by mouth daily. (0800)  .  hydrALAZINE (APRESOLINE) 50 MG tablet Take 50 mg by mouth 4 (four) times daily. 0800, 1200, 1600, 2000 - Hold for SBP <110  . irbesartan (AVAPRO) 300 MG tablet Take 300 mg by mouth daily. (0800) Hold for BP <90/60  . levothyroxine (SYNTHROID, LEVOTHROID) 150 MCG tablet Take 150 mcg by mouth daily before breakfast. (0730)  . Lidocaine (ASPERCREME LIDOCAINE) 4 % PTCH Apply 3 patches topically once. Apply 1 patch to the left hip, left knee, and the right shoulder in the morning. Remove in the evening.  . loratadine (CLARITIN) 10 MG tablet Take 1 tablet (10 mg total) by mouth daily.  . Melatonin 3 MG TABS Take 1 tablet (3 mg total) by mouth at bedtime.  Marland Kitchen omeprazole (PRILOSEC) 20 MG capsule Take 20 mg by mouth daily. (0800)  . oxyCODONE-acetaminophen (PERCOCET) 7.5-325 MG tablet Take 1 tablet by mouth 4 (four) times daily.  Four times daily scheduled and one at midnight as needed.  . psyllium (METAMUCIL) 58.6 % powder Take 1 packet by mouth daily. Mix in 6 oz of water or juice  . senna-docusate (SENOKOT S) 8.6-50 MG tablet 1 tablet nightly to prevent constipation  . sodium chloride 1 g tablet 1 tablet daily for hyponatremia  . vitamin B-12 (CYANOCOBALAMIN) 100 MCG tablet Take 1 tablet (100 mcg total) by mouth daily.  . [DISCONTINUED] doxazosin (CARDURA) 1 MG tablet One daily to help control BP (Patient not taking: Reported on 06/26/2017)  . [DISCONTINUED] polyethylene glycol powder (GLYCOLAX/MIRALAX) powder Take 17 g by mouth daily. (0800)  . [DISCONTINUED] promethazine (PHENERGAN) 25 MG suppository Place 25 mg rectally every 6 (six) hours as needed for nausea or vomiting.  . [DISCONTINUED] promethazine (PHENERGAN) 25 MG tablet Take 25 mg by mouth every 6 (six) hours as needed for nausea or vomiting.  . [DISCONTINUED] Psyllium (METAMUCIL SMOOTH TEXTURE) 28.3 % POWD One tablespoon in 6 oz wsater or juice daily to help prevent constipation (Patient taking differently: Take 15 mLs by mouth daily. (0800) One  tablespoon in 6 oz wsater or juice daily to help prevent constipation)   No facility-administered encounter medications on file as of 07/04/2017.     Review of Systems  Constitutional: Negative for chills and fever.  HENT: Negative for congestion, ear pain, rhinorrhea, sinus pressure, sinus pain, sneezing and sore throat.   Eyes: Positive for discharge and itching. Negative for redness and visual disturbance.       Wears eye glasses   Respiratory: Negative for cough, chest tightness, shortness of breath and wheezing.   Cardiovascular: Negative for chest pain, palpitations and leg swelling.  Skin: Negative for color change, pallor and rash.  Neurological: Negative for dizziness, light-headedness and headaches.  Psychiatric/Behavioral: Negative for agitation, confusion and sleep disturbance.    Immunization History  Administered Date(s) Administered  . Influenza Split 02/13/2013  . Influenza-Unspecified 02/27/2014, 02/12/2015, 02/25/2016, 03/08/2017  . PPD Test 09/09/2015  . Pneumococcal Conjugate-13 05/05/2016  . Pneumococcal Polysaccharide-23 02/13/2013  . Td 05/05/2016  . Zoster 05/04/2016   Pertinent  Health Maintenance Due  Topic Date Due  . MAMMOGRAM  09/09/2017 (Originally 03/21/1945)  . INFLUENZA VACCINE  Completed  . DEXA SCAN  Completed  . PNA vac Low Risk Adult  Completed   Fall Risk  01/02/2017 11/10/2015 10/20/2015 10/20/2015 06/30/2015  Falls in the past year? No No Yes No No  Number falls in past yr: - - 2 or more - -  Injury with Fall? - - Yes - -  Risk Factor Category  - - - - -  Risk for fall due to : - - History of fall(s);Impaired balance/gait - -  Follow up - - - - -    Vitals:   07/04/17 1120  BP: (!) 160/84  Pulse: 62  Resp: 16  Temp: 98 F (36.7 C)  SpO2: 95%  Weight: 132 lb (59.9 kg)  Height: 5\' 8"  (1.727 m)   Body mass index is 20.07 kg/m. Physical Exam  Constitutional: She is oriented to person, place, and time. She appears well-developed and  well-nourished.  Elderly in no acute distress   HENT:  Head: Normocephalic.  Mouth/Throat: Oropharynx is clear and moist. No oropharyngeal exudate.  Eyes: Conjunctivae and EOM are normal. Pupils are equal, round, and reactive to light. Right eye exhibits discharge and hordeolum. No scleral icterus.  Neck: Normal range of motion. No JVD present. No thyromegaly present.  Cardiovascular: Normal  rate, regular rhythm, normal heart sounds and intact distal pulses. Exam reveals no gallop and no friction rub.  No murmur heard. Pulmonary/Chest: Effort normal and breath sounds normal. No respiratory distress. She has no wheezes. She has no rales.  Neurological: She is oriented to person, place, and time. Coordination normal.  Skin: Skin is warm and dry. No rash noted. No erythema. No pallor.  Psychiatric: She has a normal mood and affect.   Labs reviewed: Recent Labs    03/30/17 0604 03/30/17 1016 03/31/17 0557 04/03/17 05/11/17 06/27/17 1135  NA 127*  --  127* 127 129* 128*  K 4.0  --  4.1 4.0 4.2 4.2  CL 97*  --  97* 96  --  97*  CO2 19*  --  24  --   --  23  GLUCOSE 114*  --  96  --   --  114*  BUN 21*  --  21* 24* 21 23*  CREATININE 1.03*  --  1.15* 1.12 1.0 1.06*  CALCIUM 8.7*  --  8.4* 8.4  --  8.6*  MG  --  2.1  --   --   --   --   PHOS  --  2.6  --   --   --   --    Recent Labs    02/20/17 03/29/17 03/31/17 0557  AST 15 15 19   ALT 12 11 13*  ALKPHOS 46 47 47  BILITOT  --  0.8 0.9  PROT  --  6.0 6.2*  ALBUMIN  --  3.6 3.3*   Recent Labs    03/30/17 0604 03/31/17 0557 06/27/17 1135  WBC 5.2 4.5 5.2  HGB 11.8* 11.0* 12.8  HCT 33.2* 32.4* 36.9  MCV 95.1 97.3 97.9  PLT 143* 143* 172   Lab Results  Component Value Date   TSH 1.496 03/30/2017    Lab Results  Component Value Date   CHOL 175 11/10/2014   HDL 53 11/10/2014   LDLCALC 87 11/10/2014   TRIG 173 (A) 11/10/2014    Significant Diagnostic Results in last 30 days:  No results  found.  Assessment/Plan  Hordeolum externum of right upper eyelid Afebrile.right upper eyelid red,swollen and tender to touch.Yellow drainage noted on eyelashes and distal corner of eye.Facility nurse to cleanse right eye with warm wash cloth three times daily. Then apply warm wash cloth compressor 5-10 minutes to right upper eyelid three times daily  until swelling resolve.Apply erythromycin 1 cm ribbon to right eye three times daily x 7 days. Notify provider if symptoms worsen or not resolved after ABX ointment completed.   Family/ staff Communication: Reviewed plan of care with patient and facility Nurse.   Labs/tests ordered: None   Dimitris Shanahan C Vivyan Biggers, NP

## 2017-07-05 ENCOUNTER — Encounter: Payer: Self-pay | Admitting: Internal Medicine

## 2017-07-05 ENCOUNTER — Non-Acute Institutional Stay: Payer: Medicare Other | Admitting: Internal Medicine

## 2017-07-05 VITALS — BP 126/70 | HR 62 | Temp 97.5°F | Resp 16 | Ht 68.0 in | Wt 133.2 lb

## 2017-07-05 DIAGNOSIS — G894 Chronic pain syndrome: Secondary | ICD-10-CM | POA: Diagnosis not present

## 2017-07-05 DIAGNOSIS — Z86711 Personal history of pulmonary embolism: Secondary | ICD-10-CM

## 2017-07-05 DIAGNOSIS — K219 Gastro-esophageal reflux disease without esophagitis: Secondary | ICD-10-CM

## 2017-07-05 DIAGNOSIS — I10 Essential (primary) hypertension: Secondary | ICD-10-CM

## 2017-07-05 DIAGNOSIS — J3089 Other allergic rhinitis: Secondary | ICD-10-CM | POA: Diagnosis not present

## 2017-07-05 NOTE — Patient Instructions (Signed)
  I am taking you off omeprazole for heartburn by slowly reducing the dose.  We will need to discuss pain management and decreasing your percocet dosing on next visit.  You are now off eliquis.

## 2017-07-05 NOTE — Progress Notes (Signed)
[facility]  Provider: Blanchie Serve MD   Location:  [place] of Service:  Clinic (12)  PCP: Blanchie Serve, MD Patient Care Team: Blanchie Serve, MD as PCP - General (Internal Medicine) Ngetich, Nelda Bucks, NP as Nurse Practitioner (Family Medicine)  Extended Emergency Contact Information Primary Emergency Contact: Fehl,[name] Address: [address] [name], [place] [address] [name] of [place] Mobile Phone: [phone] Relation: Son  Code Status: DNR  Goals of Care: Advanced Directive information Advanced Directives 07/04/2017  Does Patient Have a Medical Advance Directive? Yes  Type of Paramedic of [place];Out of facility DNR (pink MOST or yellow form);Living will  Does patient want to make changes to medical advance directive? -  Copy of [place] in Chart? Yes  Would patient like information on creating a medical advance directive? -  Pre-existing out of facility DNR order (yellow form or pink MOST form) Yellow form placed in chart (order not valid for inpatient use)      Chief Complaint  Patient presents with  . Acute Visit    medication concerns, eliquis, tylenol, right eye discomfort    HPI: Patient is a 84 y.o. female seen today for acute visit.   Patient would like to come off eliquis if possible. Son present during this visit. Patient had pulmonary embolism diagnosed during hospital admission 05/07/15. Recent CTA chest from 03/30/17 did not show any evidence of embolism. Currently on eliquis 2.5 mg bid. Has a stye to right eye seen by NP yesterday and is on warm compresses and erythromycin ointment. Patient resides in ALF and recently underwent hysterescopy with biopsy of endometrial mass that has resulted negative for malignancy. She has history of breast cancer s/p left mastectomy. On chart review, per pharmacy note, patient is on 3625 mg tylenol per 24 hours  and if asks for prn orders, will received 3950 mg of tylenol. She is on pain medication for chronic pain.   Past Medical History:  Diagnosis Date  . Acquired cyst of kidney    left  . Allergic rhinitis, cause unspecified   . Anemia, unspecified   . Chronic hyponatremia 09/05/2015  . CKD stage 2 due to type 2 diabetes mellitus ([place]) 11/18/2014  . Closed fracture of unspecified part of vertebral column without mention of spinal cord injury    T7 s/p kyphoplasty, T12  . Edema 2015  . Herpes simplex without mention of complication   . History of Epstein-Barr virus infection   . Hyposmolality and/or hyponatremia    07/13/15 Na 131  . Insomnia   . Insomnia, unspecified   . Left cavernous carotid aneurysm 08/2008   1-1/19mm aneurysm of cavernous carotid artery  . Malignant neoplasm of breast (female), unspecified site 1990   left. Had surgerey and chemo, but no radiation  . 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 Neuropathy   . Osteoarthrosis of knee    bilaterally  . Osteoarthrosis, hip   . Osteoarthrosis, unspecified whether generalized or localized, forearm    bilaterally wrist  . Other and unspecified nonspecific immunological findings    positive ANA  . Other B-complex deficiencies   . Pain in joint, site unspecified    severe, diffuse, chronic pain  . Positive ANA (antinuclear antibody)   . Pulmonary embolism ([place]) 05/07/2015  . Pure hypercholesterolemia   .  Senile osteoporosis   . Unspecified essential hypertension   . Unspecified gastritis and gastroduodenitis without mention of hemorrhage   . Unspecified hypothyroidism   . Unspecified vitamin D deficiency    Past Surgical History:  Procedure Laterality Date  . BREAST SURGERY Left   . DILATATION & CURETTAGE/HYSTEROSCOPY WITH MYOSURE N/A 06/27/2017   Procedure: DILATATION & CURETTAGE/HYSTEROSCOPY WITH MYOSURE;  Surgeon: Servando Salina, MD;  Location: [facility];  Service:  Gynecology;  Laterality: N/A;  . DILATION AND CURETTAGE OF UTERUS  X 2  . FEMUR IM NAIL Right 03/18/2015   Procedure: INTRAMEDULLARY (IM) NAIL FEMORAL;  Surgeon: Rod Can, MD;  Location: [facility];  Service: Orthopedics;  Laterality: Right;  . FRACTURE SURGERY     hip  . KYPHOPLASTY  07/03/2010   T12  . KYPHOPLASTY     C7  . LAPAROSCOPIC CHOLECYSTECTOMY  09/20/2006  . MASTECTOMY Left 04/29/1989  . MIDDLE EAR SURGERY Bilateral 1938  . NASAL SINUS SURGERY  1990 & 2000  . ORIF HIP FRACTURE Left 06/13/2007   following a syncopal episode  . TONSILLECTOMY  1968    reports that  has never smoked. she has never used smokeless tobacco. She reports that she drinks alcohol. She reports that she does not use drugs. Social History   Socioeconomic History  . Marital status: Widowed    Spouse name: Not on file  . Number of children: Not on file  . Years of education: Not on file  . Highest education level: Not on file  Social Needs  . Financial resource strain: Not on file  . Food insecurity - worry: Not on file  . Food insecurity - inability: Not on file  . Transportation needs - medical: Not on file  . Transportation needs - non-medical: Not on file  Occupational History  . Occupation: retired Marine scientist  Tobacco Use  . Smoking status: Never Smoker  . Smokeless tobacco: Never Used  Substance and Sexual Activity  . Alcohol use: Yes    Comment: 05/08/2015 "I'll have a glass of white wine q now and then"  . Drug use: No  . Sexual activity: No  Other Topics Concern  . Not on file  Social History Narrative   Lives at [facility] since 10/16/2013, moved from [place]    Married 1968   Never smoked   No alcohol    Exercise walking, walks with cane   POA/Living Will              History reviewed. No pertinent family history.  Health Maintenance  Topic Date Due  . MAMMOGRAM  09/09/2017 (Originally 03/21/1945)  . TETANUS/TDAP  05/05/2026  . INFLUENZA VACCINE  Completed  . DEXA SCAN   Completed  . PNA vac Low Risk Adult  Completed    Allergies  Allergen Reactions  . Aspirin     Drop in body temp with large quantities, can tolerate low doses of aspirin  . Penicillins Swelling    Has patient had a PCN reaction causing immediate rash, facial/tongue/throat swelling, SOB or lightheadedness with hypotension: No Has patient had a PCN reaction causing severe rash involving mucus membranes or skin necrosis: No Has patient had a PCN reaction that required hospitalization No Has patient had a PCN reaction occurring within the last 10 years: No If all of the above answers are "NO", then may proceed with Cephalosporin use.    Outpatient Encounter Medications as of 07/05/2017  Medication Sig  . acetaminophen (TYLENOL) 325 MG tablet Take  325 mg by mouth daily as needed for mild pain or fever.   Marland Kitchen acetaminophen (TYLENOL) 500 MG tablet Take 1,000 mg by mouth 2 (two) times daily. (0900 & 1600)  . acetaminophen (TYLENOL) 500 MG tablet Take 500 mg by mouth 2 (two) times daily.  Marland Kitchen alum & mag hydroxide-simeth (MINTOX) 200-200-20 MG/5ML suspension Take 5 mLs as needed by mouth for indigestion or heartburn.   Marland Kitchen atenolol (TENORMIN) 25 MG tablet Take 25 mg by mouth at bedtime. Hold for SBP <110 or HR <60  . calcium citrate (CALCITRATE) 950 MG tablet Take 1 tablet (200 mg of elemental calcium total) by mouth daily.  . cholecalciferol (VITAMIN D) 1000 UNITS tablet Take 3,000 Units by mouth daily at 12 noon. (1200)  . denosumab (PROLIA) 60 MG/ML SOLN injection Inject 60 mg every 6 (six) months into the skin. 10th of sixth month - Administer in upper arm, thigh, or abdomen  . erythromycin ophthalmic ointment Place 1 application into the right eye 3 (three) times daily.  . feeding supplement (BOOST / RESOURCE BREEZE) LIQD Take 1 Container by mouth 2 (two) times daily between meals. (1000 & 1600) Prefers Chocolate  . hydrALAZINE (APRESOLINE) 50 MG tablet Take 50 mg by mouth 4 (four) times daily. 0800,  1200, 1600, 2000 - Hold for SBP <110  . irbesartan (AVAPRO) 300 MG tablet Take 300 mg by mouth daily. (0800) Hold for BP <90/60  . levothyroxine (SYNTHROID, LEVOTHROID) 150 MCG tablet Take 150 mcg by mouth daily before breakfast. (0730)  . Lidocaine (ASPERCREME LIDOCAINE) 4 % PTCH Apply 3 patches topically once. Apply 1 patch to the left hip, left knee, and the right shoulder in the morning. Remove in the evening.  . loratadine (CLARITIN) 10 MG tablet Take 1 tablet (10 mg total) by mouth daily.  . Melatonin 3 MG TABS Take 1 tablet (3 mg total) by mouth at bedtime.  Marland Kitchen omeprazole (PRILOSEC) 20 MG capsule Take 20 mg by mouth daily. Take 10 mg daily x 1 week, then stop.   Marland Kitchen oxyCODONE-acetaminophen (PERCOCET) 7.5-325 MG tablet Take 1 tablet by mouth 4 (four) times daily. Four times daily scheduled and one at midnight as needed.  . promethazine (PHENERGAN) 25 MG suppository Place 25 mg rectally every 6 (six) hours as needed for nausea or vomiting.  . psyllium (METAMUCIL) 58.6 % powder Take 1 packet by mouth daily. Mix in 6 oz of water or juice  . senna-docusate (SENOKOT S) 8.6-50 MG tablet 1 tablet nightly to prevent constipation  . sodium chloride 1 g tablet 1 tablet daily for hyponatremia  . vitamin B-12 (CYANOCOBALAMIN) 100 MCG tablet Take 1 tablet (100 mcg total) by mouth daily.  . [DISCONTINUED] apixaban (ELIQUIS) 2.5 MG TABS tablet Take 2.5 mg by mouth 2 (two) times daily. (0800 & 2000)  . [DISCONTINUED] fexofenadine (ALLEGRA) 60 MG tablet Take 60 mg by mouth daily. (0800)   No facility-administered encounter medications on file as of 07/05/2017.     Review of Systems  Constitutional: Negative for fatigue and fever.  HENT: Negative for mouth sores.        Allergies +  Respiratory: Negative for cough and shortness of breath.   Cardiovascular: Negative for chest pain and palpitations.  Gastrointestinal: Negative for abdominal pain, constipation, diarrhea, nausea and vomiting.  Genitourinary:  Negative for dysuria, pelvic pain, vaginal bleeding and vaginal pain.  Musculoskeletal: Positive for arthralgias, back pain and gait problem.  Neurological: Negative for dizziness and headaches.  Psychiatric/Behavioral: Negative for confusion.  Vitals:   07/05/17 1155  BP: 126/70  Pulse: 62  Resp: 16  Temp: (!) 97.5 F (36.4 C)  TempSrc: Oral  SpO2: 97%  Weight: 133 lb 3.2 oz (60.4 kg)  Height: 5\' 8"  (1.727 m)   Body mass index is 20.25 kg/m. Physical Exam  Constitutional: She is oriented to person, place, and time. No distress.  Thin built, frail  HENT:  Head: Normocephalic and atraumatic.  Mouth/Throat: Oropharynx is clear and moist.  Eyes: Conjunctivae and EOM are normal. Pupils are equal, round, and reactive to light. Right eye exhibits no discharge. Left eye exhibits no discharge.  Stye to right eye  Neck: Neck supple.  Cardiovascular: Normal rate and regular rhythm.  Pulmonary/Chest: Effort normal and breath sounds normal. She has no wheezes. She has no rales.  Abdominal: Soft. Bowel sounds are normal. There is no tenderness.  Musculoskeletal: She exhibits edema and deformity.  Lymphadenopathy:    She has no cervical adenopathy.  Neurological: She is alert and oriented to person, place, and time.  Skin: Skin is warm and dry. She is not diaphoretic.  Psychiatric: She has a normal mood and affect.    Labs reviewed: Basic Metabolic Panel: Recent Labs    03/30/17 0604 03/30/17 1016 03/31/17 0557 04/03/17 05/11/17 06/27/17 1135 07/03/17  NA 127*  --  127* 127 129* 128* 130*  K 4.0  --  4.1 4.0 4.2 4.2 4.3  CL 97*  --  97* 96  --  97*  --   CO2 19*  --  24  --   --  23  --   GLUCOSE 114*  --  96  --   --  114*  --   BUN 21*  --  21* 24* 21 23* 21  CREATININE 1.03*  --  1.15* 1.12 1.0 1.06* 1.0  CALCIUM 8.7*  --  8.4* 8.4  --  8.6*  --   MG  --  2.1  --   --   --   --   --   PHOS  --  2.6  --   --   --   --   --    Liver Function Tests: Recent Labs     03/29/17 03/31/17 0557 07/03/17  AST 15 19 26   ALT 11 13* 17  ALKPHOS 47 47 45  BILITOT 0.8 0.9  --   PROT 6.0 6.2*  --   ALBUMIN 3.6 3.3*  --    No results for input(s): LIPASE, AMYLASE in the last 8760 hours. No results for input(s): AMMONIA in the last 8760 hours. CBC: Recent Labs    03/30/17 0604 03/31/17 0557 06/27/17 1135 07/03/17  WBC 5.2 4.5 5.2 6.2  HGB 11.8* 11.0* 12.8 12.0  HCT 33.2* 32.4* 36.9 34*  MCV 95.1 97.3 97.9  --   PLT 143* 143* 172 188   Cardiac Enzymes: Recent Labs    03/30/17 0949 03/30/17 1545 03/30/17 2113  TROPONINI 0.04* 0.06* 0.04*   BNP: Invalid input(s): POCBNP No results found for: HGBA1C Lab Results  Component Value Date   TSH 1.496 03/30/2017   Lab Results  Component Value Date   VITAMINB12 1,076 02/16/2017   Lab Results  Component Value Date   FOLATE 23.4 09/05/2015   Lab Results  Component Value Date   IRON 75 09/05/2015   TIBC 265 09/05/2015   FERRITIN 124 09/05/2015    Lipid Panel: No results for input(s): CHOL, HDL, LDLCALC, TRIG, CHOLHDL, LDLDIRECT in the last  8760 hours. No results found for: HGBA1C  Procedures since last visit: No results found.  Assessment/Plan  1. Non-seasonal allergic rhinitis, unspecified trigger claritin has been helping some with her symptoms, continue this  2. Essential hypertension Stable and improved BP reading. Continue hydralazine 50 mg qid, atenolol and irbesartan current regimen. If BP remains stable, consider decreasing her atenolol or ARB. Pt and son agrees.  3. History of pulmonary embolism PE in 04/2015. Currently no respiratory symptoms. Has completed treatment duration. Discontinue eliquis.   4. GERD without esophagitis Controlled symptom, decrease omeprazole to 10 mg daily for 1 week and stop.   5. Chronic pain syndrome Continue current scheduled percocet and tylenol. D/c prn tylenol and percocet order. Decrease scheduled tylenol to 500 mg in am and 1000 mg in  pm.  Reviewed recent labs from 07/03/17. Improved sodium level.   Labs/tests ordered:  none  Next appointment: has f/u in 09/2017  Communication: reviewed care plan with patient and her son.     Blanchie Serve, MD Internal Medicine [facility] [address] [place], [place] [address] Cell Phone (Monday-Friday 8 am - 5 pm): [phone] On Call: [phone] and follow prompts after 5 pm and on weekends Office Phone: [phone] Office Fax: [phone]

## 2017-07-13 ENCOUNTER — Encounter: Payer: Self-pay | Admitting: Family

## 2017-07-13 ENCOUNTER — Encounter: Payer: Self-pay | Admitting: *Deleted

## 2017-07-13 ENCOUNTER — Non-Acute Institutional Stay: Payer: Medicare Other | Admitting: Family

## 2017-07-13 DIAGNOSIS — H00011 Hordeolum externum right upper eyelid: Secondary | ICD-10-CM

## 2017-07-13 DIAGNOSIS — N182 Chronic kidney disease, stage 2 (mild): Secondary | ICD-10-CM | POA: Diagnosis not present

## 2017-07-13 DIAGNOSIS — I10 Essential (primary) hypertension: Secondary | ICD-10-CM | POA: Diagnosis not present

## 2017-07-13 LAB — BASIC METABOLIC PANEL
BUN: 21 (ref 4–21)
BUN: 21 (ref 4–21)
CALCIUM: 8.2
CREATININE: 1 (ref 0.5–1.1)
Creat: 0.98
GLUCOSE: 94
Glucose: 94
POTASSIUM: 3.9
Potassium: 3.9 (ref 3.4–5.3)
Sodium: 128
Sodium: 128 — AB (ref 137–147)

## 2017-07-13 NOTE — Progress Notes (Addendum)
Location:  [facility] Room Number: 35 Place of Service:  ALF [phone]) Provider: Edwen Mclester FNP-C  Blanchie Serve, MD  Patient Care Team: Blanchie Serve, MD as PCP - General (Internal Medicine) Preciosa Bundrick, Nelda Bucks, NP as Nurse Practitioner (Family Medicine)  Extended Emergency Contact Information Primary Emergency Contact: Wieczorek,[name] Address: [address] [place], [place] [address] [place] Mobile Phone: [phone] Relation: Son  Code Status:  DNR Goals of care: Advanced Directive information Advanced Directives 07/13/2017  Does Patient Have a Medical Advance Directive? Yes  Type of Paramedic of [place];Living will;Out of facility DNR (pink MOST or yellow form)  Does patient want to make changes to medical advance directive? -  Copy of [place] in Chart? Yes  Would patient like information on creating a medical advance directive? -  Pre-existing out of facility DNR order (yellow form or pink MOST form) Yellow form placed in chart (order not valid for inpatient use)     Chief Complaint  Patient presents with  . Acute Visit    recheck eye    HPI:  Pt is a 84 y.o. female seen today at [facility] for an acute visit for follow up right eye stye.she is seen in her room today per facility Nurse request.Nurse reports patient's stye area had some pus drainage one day ago but seems to be much better today.Patient's states pain and redness of the eye has resolved but still has small swelling on the eyelid. She denies any pain,itchying or impaired visual changes.she denies any fever or chills.    Past Medical History:  Diagnosis Date  . Acquired cyst of kidney    left  . Allergic rhinitis, cause unspecified   . Anemia, unspecified   . Chronic hyponatremia 09/05/2015  . CKD stage 2 due to type 2 diabetes mellitus ([place]) 11/18/2014  . Closed fracture of unspecified part of vertebral  column without mention of spinal cord injury    T7 s/p kyphoplasty, T12  . Edema 2015  . Herpes simplex without mention of complication   . History of Epstein-Barr virus infection   . Hyposmolality and/or hyponatremia    07/13/15 Na 131  . Insomnia   . Insomnia, unspecified   . Left cavernous carotid aneurysm 08/2008   1-1/57mm aneurysm of cavernous carotid artery  . Malignant neoplasm of breast (female), unspecified site 1990   left. Had surgerey and chemo, but no radiation  . 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 Neuropathy   . Osteoarthrosis of knee    bilaterally  . Osteoarthrosis, hip   . Osteoarthrosis, unspecified whether generalized or localized, forearm    bilaterally wrist  . Other and unspecified nonspecific immunological findings    positive ANA  . Other B-complex deficiencies   . Pain in joint, site unspecified    severe, diffuse, chronic pain  . Positive ANA (antinuclear antibody)   . Pulmonary embolism ([place]) 05/07/2015  . Pure hypercholesterolemia   . Senile osteoporosis   . Unspecified essential hypertension   . Unspecified gastritis and gastroduodenitis without mention of hemorrhage   . Unspecified hypothyroidism   . Unspecified vitamin D deficiency    Past Surgical History:  Procedure Laterality Date  . BREAST SURGERY Left   . DILATATION & CURETTAGE/HYSTEROSCOPY WITH MYOSURE N/A 06/27/2017   Procedure: DILATATION & CURETTAGE/HYSTEROSCOPY WITH MYOSURE;  Surgeon: Servando Salina, MD;  Location: [facility];  Service: Gynecology;  Laterality: N/A;  . DILATION AND CURETTAGE OF UTERUS  X 2  . FEMUR IM NAIL Right 03/18/2015   Procedure: INTRAMEDULLARY (IM) NAIL FEMORAL;  Surgeon: Rod Can, MD;  Location: [facility];  Service: Orthopedics;  Laterality: Right;  . FRACTURE SURGERY     hip  . KYPHOPLASTY  07/03/2010   T12  . KYPHOPLASTY     C7  . LAPAROSCOPIC CHOLECYSTECTOMY  09/20/2006  . MASTECTOMY Left 04/29/1989  .  MIDDLE EAR SURGERY Bilateral 1938  . NASAL SINUS SURGERY  1990 & 2000  . ORIF HIP FRACTURE Left 06/13/2007   following a syncopal episode  . TONSILLECTOMY  1968    Allergies  Allergen Reactions  . Aspirin     Drop in body temp with large quantities, can tolerate low doses of aspirin  . Penicillins Swelling    Has patient had a PCN reaction causing immediate rash, facial/tongue/throat swelling, SOB or lightheadedness with hypotension: No Has patient had a PCN reaction causing severe rash involving mucus membranes or skin necrosis: No Has patient had a PCN reaction that required hospitalization No Has patient had a PCN reaction occurring within the last 10 years: No If all of the above answers are "NO", then may proceed with Cephalosporin use.    Outpatient Encounter Medications as of 07/13/2017  Medication Sig  . acetaminophen (TYLENOL) 325 MG tablet Take 325 mg by mouth daily as needed for mild pain or fever.   Marland Kitchen acetaminophen (TYLENOL) 500 MG tablet Take 1,000 mg by mouth 2 (two) times daily. (0900 & 1600)  . acetaminophen (TYLENOL) 500 MG tablet Take 500 mg by mouth 2 (two) times daily.  Marland Kitchen alum & mag hydroxide-simeth (MINTOX) 200-200-20 MG/5ML suspension Take 5 mLs as needed by mouth for indigestion or heartburn.   Marland Kitchen atenolol (TENORMIN) 25 MG tablet Take 25 mg by mouth at bedtime. Hold for SBP <110 or HR <60  . calcium citrate (CALCITRATE) 950 MG tablet Take 1 tablet (200 mg of elemental calcium total) by mouth daily.  . cholecalciferol (VITAMIN D) 1000 UNITS tablet Take 3,000 Units by mouth daily at 12 noon. (1200)  . denosumab (PROLIA) 60 MG/ML SOLN injection Inject 60 mg every 6 (six) months into the skin. 10th of sixth month - Administer in upper arm, thigh, or abdomen  . feeding supplement (BOOST / RESOURCE BREEZE) LIQD Take 1 Container by mouth 2 (two) times daily between meals. (1000 & 1600) Prefers Chocolate  . hydrALAZINE (APRESOLINE) 50 MG tablet Take 50 mg by mouth 4 (four)  times daily. 0800, 1200, 1600, 2000 - Hold for SBP <110  . irbesartan (AVAPRO) 300 MG tablet Take 300 mg by mouth daily. (0800) Hold for BP <90/60  . levothyroxine (SYNTHROID, LEVOTHROID) 150 MCG tablet Take 150 mcg by mouth daily before breakfast. (0730)  . Lidocaine (ASPERCREME LIDOCAINE) 4 % PTCH Apply 3 patches topically once. Apply 1 patch to the left hip, left knee, and the right shoulder in the morning. Remove in the evening.  . loratadine (CLARITIN) 10 MG tablet Take 1 tablet (10 mg total) by mouth daily.  . Melatonin 3 MG TABS Take 1 tablet (3 mg total) by mouth at bedtime.  Marland Kitchen oxyCODONE-acetaminophen (PERCOCET) 7.5-325 MG tablet Take 1 tablet by mouth 4 (four) times daily. Four times daily scheduled and one at midnight as needed.  . promethazine (PHENERGAN) 25 MG suppository Place 25 mg rectally every 6 (six) hours as  needed for nausea or vomiting.  . psyllium (METAMUCIL) 58.6 % powder Take 1 packet by mouth daily. Mix in 6 oz of water or juice  . senna-docusate (SENOKOT S) 8.6-50 MG tablet 1 tablet nightly to prevent constipation  . sodium chloride 1 g tablet 1 tablet daily for hyponatremia  . vitamin B-12 (CYANOCOBALAMIN) 100 MCG tablet Take 1 tablet (100 mcg total) by mouth daily.  . [DISCONTINUED] erythromycin ophthalmic ointment Place 1 application into the right eye 3 (three) times daily.  . [DISCONTINUED] omeprazole (PRILOSEC) 20 MG capsule Take 20 mg by mouth daily. Take 10 mg daily x 1 week, then stop.    No facility-administered encounter medications on file as of 07/13/2017.     Review of Systems  Constitutional: Negative for chills, fatigue and fever.  HENT: Negative for congestion, ear pain, postnasal drip, rhinorrhea, sinus pressure, sinus pain, sneezing and sore throat.   Eyes: Negative for pain, discharge, itching and visual disturbance.       Wears eye glasses.right upper eyelid stye.  Respiratory: Negative for cough, chest tightness, shortness of breath and wheezing.    Cardiovascular: Negative for chest pain, palpitations and leg swelling.  Gastrointestinal: Negative for abdominal distention, abdominal pain, constipation, diarrhea, nausea and vomiting.  Skin: Negative for color change, pallor and rash.  Neurological: Negative for dizziness, light-headedness and headaches.  Psychiatric/Behavioral: Negative for agitation and sleep disturbance. The patient is not nervous/anxious.     Immunization History  Administered Date(s) Administered  . Influenza Split 02/13/2013  . Influenza-Unspecified 02/27/2014, 02/12/2015, 02/25/2016, 03/08/2017  . PPD Test 09/09/2015  . Pneumococcal Conjugate-13 05/05/2016  . Pneumococcal Polysaccharide-23 02/13/2013  . Td 05/05/2016  . Zoster 05/04/2016   Pertinent  Health Maintenance Due  Topic Date Due  . MAMMOGRAM  09/09/2017 (Originally 03/21/1945)  . INFLUENZA VACCINE  Completed  . DEXA SCAN  Completed  . PNA vac Low Risk Adult  Completed   Fall Risk  01/02/2017 11/10/2015 10/20/2015 10/20/2015 06/30/2015  Falls in the past year? No No Yes No No  Number falls in past yr: - - 2 or more - -  Injury with Fall? - - Yes - -  Risk Factor Category  - - - - -  Risk for fall due to : - - History of fall(s);Impaired balance/gait - -  Follow up - - - - -   Functional Status Survey:    Vitals:   07/13/17 1315  BP: 132/65  Pulse: (!) 59  Resp: 20  Temp: 98.9 F (37.2 C)  SpO2: 97%  Weight: 132 lb (59.9 kg)  Height: 5\' 8"  (1.727 m)   Body mass index is 20.07 kg/m. Physical Exam  Constitutional: She is oriented to person, place, and time. She appears well-developed.  Elderly in no acute distress   HENT:  Head: Normocephalic.  Mouth/Throat: Oropharynx is clear and moist. No oropharyngeal exudate.  Eyes: Conjunctivae and EOM are normal. Pupils are equal, round, and reactive to light. Right eye exhibits hordeolum. Right eye exhibits no discharge and no exudate. Left eye exhibits no discharge and no exudate. No scleral  icterus.  Right upper eyelid stye surrounding skin tissue without any redness or drainage.   Neck: Normal range of motion. No JVD present. No thyromegaly present.  Cardiovascular: Normal rate, regular rhythm, normal heart sounds and intact distal pulses. Exam reveals no gallop and no friction rub.  No murmur heard. Pulmonary/Chest: Effort normal and breath sounds normal. No respiratory distress. She has no wheezes. She has no  rales.  Abdominal: Soft. Bowel sounds are normal. She exhibits no distension. There is no tenderness. There is no rebound and no guarding.  Lymphadenopathy:    She has no cervical adenopathy.  Neurological: She is oriented to person, place, and time.  Skin: Skin is warm and dry. No rash noted. No erythema. No pallor.  Psychiatric: She has a normal mood and affect.   Labs reviewed: Recent Labs    03/30/17 0604 03/30/17 1016 03/31/17 0557 04/03/17  06/27/17 1135 07/03/17 07/13/17  NA 127*  --  127* 127   < > 128* 130* 128  K 4.0  --  4.1 4.0   < > 4.2 4.3 3.9  CL 97*  --  97* 96  --  97*  --   --   CO2 19*  --  24  --   --  23  --   --   GLUCOSE 114*  --  96  --   --  114*  --   --   BUN 21*  --  21* 24*   < > 23* 21 21  CREATININE 1.03*  --  1.15* 1.12   < > 1.06* 1.0 0.98  CALCIUM 8.7*  --  8.4* 8.4  --  8.6*  --  8.2  MG  --  2.1  --   --   --   --   --   --   PHOS  --  2.6  --   --   --   --   --   --    < > = values in this interval not displayed.   Recent Labs    03/29/17 03/31/17 0557 07/03/17  AST 15 19 26   ALT 11 13* 17  ALKPHOS 47 47 45  BILITOT 0.8 0.9  --   PROT 6.0 6.2*  --   ALBUMIN 3.6 3.3*  --    Recent Labs    03/30/17 0604 03/31/17 0557 06/27/17 1135 07/03/17  WBC 5.2 4.5 5.2 6.2  HGB 11.8* 11.0* 12.8 12.0  HCT 33.2* 32.4* 36.9 34*  MCV 95.1 97.3 97.9  --   PLT 143* 143* 172 188   Lab Results  Component Value Date   TSH 1.496 03/30/2017   No results found for: HGBA1C Lab Results  Component Value Date   CHOL 175  11/10/2014   HDL 53 11/10/2014   LDLCALC 87 11/10/2014   TRIG 173 (A) 11/10/2014    Significant Diagnostic Results in last 30 days:  No results found.  Assessment/Plan  Hordeolum externum of right upper eyelid Afebrile.Right upper eyelid stye surrounding skin tissue without any redness or drainage.conjuctiva clear.no visual impairment.complete course of erythromycin ointment.continue with warm wet wash cloth compressor to right upper eyelid stye 5-10 minutes three times daily stye until resolve.continue to monitor.   Family/ staff Communication: Reviewed plan of care with patient and facility Nurse.  Labs/tests ordered: None   Aneesh Faller C Derica Leiber, NP

## 2017-07-14 NOTE — Addendum Note (Signed)
Addended byMarlowe Sax C on: 07/14/2017 03:36 PM   Modules accepted: Level of Service

## 2017-07-27 DIAGNOSIS — H00011 Hordeolum externum right upper eyelid: Secondary | ICD-10-CM | POA: Diagnosis not present

## 2017-07-27 DIAGNOSIS — H00014 Hordeolum externum left upper eyelid: Secondary | ICD-10-CM | POA: Diagnosis not present

## 2017-07-31 ENCOUNTER — Encounter: Payer: Self-pay | Admitting: Internal Medicine

## 2017-07-31 ENCOUNTER — Non-Acute Institutional Stay: Payer: Medicare Other | Admitting: Internal Medicine

## 2017-07-31 ENCOUNTER — Other Ambulatory Visit: Payer: Self-pay

## 2017-07-31 DIAGNOSIS — M159 Polyosteoarthritis, unspecified: Secondary | ICD-10-CM

## 2017-07-31 DIAGNOSIS — I129 Hypertensive chronic kidney disease with stage 1 through stage 4 chronic kidney disease, or unspecified chronic kidney disease: Secondary | ICD-10-CM | POA: Diagnosis not present

## 2017-07-31 DIAGNOSIS — M150 Primary generalized (osteo)arthritis: Secondary | ICD-10-CM

## 2017-07-31 DIAGNOSIS — K219 Gastro-esophageal reflux disease without esophagitis: Secondary | ICD-10-CM

## 2017-07-31 DIAGNOSIS — N183 Chronic kidney disease, stage 3 (moderate): Secondary | ICD-10-CM

## 2017-07-31 LAB — BASIC METABOLIC PANEL
Calcium: 8.2
Carbon Dioxide, Total: 24
Chloride: 97

## 2017-07-31 MED ORDER — OXYCODONE-ACETAMINOPHEN 7.5-325 MG PO TABS: 1.0000 | ORAL_TABLET | Freq: Four times a day (QID) | ORAL | 0 refills | Status: DC

## 2017-07-31 NOTE — Progress Notes (Signed)
Location:  [name] Room Number: 35 Place of Service:  ALF [phone]) Provider:  Blanchie Serve, MD  Bailey Serve, MD  Patient Care Team: Bailey Serve, MD as PCP - General (Internal Medicine) Bailey [name], Bailey Bucks, NP as Nurse Practitioner (Family Medicine)  Extended Emergency Contact Information Primary Emergency Contact: Bailey [name],Bailey [name] Address: [address] [place], [place] [address] [place] Mobile Phone: [phone] Relation: Son  Code Status:  DNR  Goals of care: Advanced Directive information Advanced Directives 07/31/2017  Does Patient Have a Medical Advance Directive? Yes  Type of Paramedic of [place];Living will;Out of facility DNR (pink MOST or yellow form)  Does patient want to make changes to medical advance directive? No - Patient declined  Copy of [place] in Chart? Yes  Would patient like information on creating a medical advance directive? -  Pre-existing out of facility DNR order (yellow form or pink MOST form) Yellow form placed in chart (order not valid for inpatient use)     Chief Complaint  Patient presents with  . Acute Visit    Elevated blood pressure    HPI:  Pt is a 84 y.o. female seen today for an acute visit for elevated BP readings. She has medical history of hypertension and chronic pain with OA. At present she takes hydralazine 50 mg qid, irbesartan 300 mg daily, atenolol 25 mg daily for her hypertension. She is on percocet 7.5-325 mg qid with tylenol 500 mg am and1000 mg at bedtime for pain with lidocaine patch. Of note she has history of ORIF to intertrochanteric right femur fracture. dexa 03/13/17 with T score -1.0. Patient mentions having drip to the back of her throat and coughing up some mucus occasionally with some nausea in the morning.    Past Medical History:  Diagnosis Date  . Acquired cyst of kidney    left  . Allergic rhinitis, cause  unspecified   . Anemia, unspecified   . Chronic hyponatremia 09/05/2015  . CKD stage 2 due to type 2 diabetes mellitus ([facility]) 11/18/2014  . Closed fracture of unspecified part of vertebral column without mention of spinal cord injury    T7 s/p kyphoplasty, T12  . Edema 2015  . Herpes simplex without mention of complication   . History of Epstein-Barr virus infection   . Hyposmolality and/or hyponatremia    07/13/15 Na 131  . Insomnia   . Insomnia, unspecified   . Left cavernous carotid aneurysm 08/2008   1-1/49mm aneurysm of cavernous carotid artery  . Malignant neoplasm of breast (female), unspecified site 1990   left. Had surgerey and chemo, but no radiation  . 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 Neuropathy   . Osteoarthrosis of knee    bilaterally  . Osteoarthrosis, hip   . Osteoarthrosis, unspecified whether generalized or localized, forearm    bilaterally wrist  . Other and unspecified nonspecific immunological findings    positive ANA  . Other B-complex deficiencies   . Pain in joint, site unspecified    severe, diffuse, chronic pain  . Positive ANA (antinuclear antibody)   . Pulmonary embolism ([facility]) 05/07/2015  . Pure hypercholesterolemia   . Senile osteoporosis   . Unspecified essential hypertension   . Unspecified gastritis and gastroduodenitis without mention of hemorrhage   . Unspecified hypothyroidism   . Unspecified vitamin D deficiency  Past Surgical History:  Procedure Laterality Date  . BREAST SURGERY Left   . DILATATION & CURETTAGE/HYSTEROSCOPY WITH MYOSURE N/A 06/27/2017   Procedure: DILATATION & CURETTAGE/HYSTEROSCOPY WITH MYOSURE;  Surgeon: Bailey Salina, MD;  Location: [facility];  Service: Gynecology;  Laterality: N/A;  . DILATION AND CURETTAGE OF UTERUS  X 2  . FEMUR IM NAIL Right 03/18/2015   Procedure: INTRAMEDULLARY (IM) NAIL FEMORAL;  Surgeon: Bailey Can, MD;  Location: [facility];  Service:  Orthopedics;  Laterality: Right;  . FRACTURE SURGERY     hip  . KYPHOPLASTY  07/03/2010   T12  . KYPHOPLASTY     C7  . LAPAROSCOPIC CHOLECYSTECTOMY  09/20/2006  . MASTECTOMY Left 04/29/1989  . MIDDLE EAR SURGERY Bilateral 1938  . NASAL SINUS SURGERY  1990 & 2000  . ORIF HIP FRACTURE Left 06/13/2007   following a syncopal episode  . TONSILLECTOMY  1968    Allergies  Allergen Reactions  . Aspirin     Drop in body temp with large quantities, [name] tolerate low doses of aspirin  . Penicillins Swelling    Has patient had a PCN reaction causing immediate rash, facial/tongue/throat swelling, SOB or lightheadedness with hypotension: No Has patient had a PCN reaction causing severe rash involving mucus membranes or skin necrosis: No Has patient had a PCN reaction that required hospitalization No Has patient had a PCN reaction occurring within the last 10 years: No If all of the above answers are "NO", then may proceed with Cephalosporin use.    Outpatient Encounter Medications as of 07/31/2017  Medication Sig  . acetaminophen (TYLENOL) 500 MG tablet Take 1,000 mg by mouth at bedtime. (0900 & 1600)  . acetaminophen (TYLENOL) 500 MG tablet Take 500 mg by mouth daily.   Marland Kitchen alum & mag hydroxide-simeth (MINTOX) 200-200-20 MG/5ML suspension Take 5 mLs as needed by mouth for indigestion or heartburn.   Marland Kitchen atenolol (TENORMIN) 25 MG tablet Take 25 mg by mouth at bedtime. Hold for SBP <110 or HR <60  . calcium citrate (CALCITRATE) 950 MG tablet Take 1 tablet (200 mg of elemental calcium total) by mouth daily.  . cholecalciferol (VITAMIN D) 1000 UNITS tablet Take 3,000 Units by mouth daily at 12 noon. (1200)  . denosumab (PROLIA) 60 MG/ML SOLN injection Inject 60 mg every 6 (six) months into the skin. 10th of sixth month - Administer in upper arm, thigh, or abdomen  . doxycycline (DORYX) 100 MG EC tablet Take 100 mg by mouth 2 (two) times daily. Stop date 08/06/17  . feeding supplement (BOOST / RESOURCE  BREEZE) LIQD Take 1 Container by mouth 2 (two) times daily between meals. (1000 & 1600) Prefers Chocolate  . hydrALAZINE (APRESOLINE) 50 MG tablet Take 50 mg by mouth 4 (four) times daily. 0800, 1200, 1600, 2000 - Hold for SBP <110  . irbesartan (AVAPRO) 300 MG tablet Take 300 mg by mouth daily. (0800) Hold for BP <90/60  . levothyroxine (SYNTHROID, LEVOTHROID) 150 MCG tablet Take 150 mcg by mouth daily before breakfast. (0730)  . Lidocaine (ASPERCREME LIDOCAINE) 4 % PTCH Apply 3 patches topically once. Apply 1 patch to the left hip, left knee, and the right shoulder in the morning. Remove in the evening.  . loratadine (CLARITIN) 10 MG tablet Take 1 tablet (10 mg total) by mouth daily.  . Melatonin 3 MG TABS Take 1 tablet (3 mg total) by mouth at bedtime.  Marland Kitchen oxyCODONE-acetaminophen (PERCOCET) 7.5-325 MG tablet Take 1 tablet by mouth 4 (four) times daily.  Four times daily scheduled and one at midnight as needed.  . psyllium (METAMUCIL) 58.6 % powder Take 1 packet by mouth daily. Mix in 6 oz of water or juice  . saccharomyces boulardii (FLORASTOR) 250 MG capsule Take 250 mg by mouth 2 (two) times daily. Stop date 08/06/17  . senna-docusate (SENOKOT S) 8.6-50 MG tablet 1 tablet nightly to prevent constipation  . sodium chloride 1 g tablet 1 tablet daily for hyponatremia  . Tobramycin-Dexamethasone (TOBRADEX OP) Apply 1 application to eye 2 (two) times daily. Both eyes  . vitamin B-12 (CYANOCOBALAMIN) 100 MCG tablet Take 1 tablet (100 mcg total) by mouth daily.  . [DISCONTINUED] acetaminophen (TYLENOL) 325 MG tablet Take 325 mg by mouth daily as needed for mild pain or fever.   . [DISCONTINUED] promethazine (PHENERGAN) 25 MG suppository Place 25 mg rectally every 6 (six) hours as needed for nausea or vomiting.   No facility-administered encounter medications on file as of 07/31/2017.     Review of Systems  Constitutional: Negative for appetite change, chills, fatigue and fever.  HENT: Positive for  postnasal drip.   Respiratory: Negative for cough and shortness of breath.   Cardiovascular: Negative for chest pain and palpitations.  Gastrointestinal: Negative for abdominal pain.  Genitourinary: Negative for dysuria.  Musculoskeletal: Positive for arthralgias and gait problem. Negative for joint swelling.  Skin: Negative for rash and wound.  Neurological: Negative for dizziness and headaches.    Immunization History  Administered Date(s) Administered  . Influenza Split 02/13/2013  . Influenza-Unspecified 02/27/2014, 02/12/2015, 02/25/2016, 03/08/2017  . PPD Test 09/09/2015  . Pneumococcal Conjugate-13 05/05/2016  . Pneumococcal Polysaccharide-23 02/13/2013  . Td 05/05/2016  . Zoster 05/04/2016   Pertinent  Health Maintenance Due  Topic Date Due  . MAMMOGRAM  09/09/2017 (Originally 03/21/1945)  . INFLUENZA VACCINE  Completed  . DEXA SCAN  Completed  . PNA vac Low Risk Adult  Completed   Fall Risk  01/02/2017 11/10/2015 10/20/2015 10/20/2015 06/30/2015  Falls in the past year? No No Yes No No  Number falls in past yr: - - 2 or more - -  Injury with Fall? - - Yes - -  Risk Factor Category  - - - - -  Risk for fall due to : - - History of fall(s);Impaired balance/gait - -  Follow up - - - - -   Functional Status Survey:    Vitals:   07/31/17 0954  BP: (!) 144/85  Pulse: 62  Resp: 20  Temp: 98.1 F (36.7 C)  TempSrc: Oral  SpO2: 94%  Weight: 130 lb 6.4 oz (59.1 kg)  Height: 5\' 8"  (1.727 m)   Body mass index is 19.83 kg/m. Physical Exam  Constitutional: She is oriented to person, place, and time. No distress.  Thin built, frail, elderly female  HENT:  Head: Normocephalic and atraumatic.  Mouth/Throat: Oropharynx is clear and moist. No oropharyngeal exudate.  Eyes: Conjunctivae and EOM are normal. Pupils are equal, round, and reactive to light.  Neck: Normal range of motion. Neck supple.  Cardiovascular: Normal rate and regular rhythm.  Pulmonary/Chest: Effort normal  and breath sounds normal. She has no wheezes. She has no rales.  Abdominal: Soft. Bowel sounds are normal.  Musculoskeletal: She exhibits deformity.  Unsteady gait, uses walker for ambulation, arthritis changes to fingers, knee crepitus present and has good ROM  Lymphadenopathy:    She has no cervical adenopathy.  Neurological: She is alert and oriented to person, place, and time.  Skin: Skin  is warm and dry.  Psychiatric: She has a normal mood and affect.    Labs reviewed: Recent Labs    03/30/17 0604 03/30/17 1016 03/31/17 0557 04/03/17  06/27/17 1135 07/03/17 07/13/17  NA 127*  --  127* 127   < > 128* 130* 128*  128  K 4.0  --  4.1 4.0   < > 4.2 4.3 3.9  3.9  CL 97*  --  97* 96  --  97*  --  97  CO2 19*  --  24  --   --  23  --  24  GLUCOSE 114*  --  96  --   --  114*  --   --   BUN 21*  --  21* 24*   < > 23* 21 21  21   CREATININE 1.03*  --  1.15* 1.12   < > 1.06* 1.0 1.0  0.98  CALCIUM 8.7*  --  8.4* 8.4  --  8.6*  --  8.2  8.2  MG  --  2.1  --   --   --   --   --   --   PHOS  --  2.6  --   --   --   --   --   --    < > = values in this interval not displayed.   Recent Labs    03/29/17 03/31/17 0557 07/03/17  AST 15 19 26   ALT 11 13* 17  ALKPHOS 47 47 45  BILITOT 0.8 0.9  --   PROT 6.0 6.2*  --   ALBUMIN 3.6 3.3*  --    Recent Labs    03/30/17 0604 03/31/17 0557 06/27/17 1135 07/03/17  WBC 5.2 4.5 5.2 6.2  HGB 11.8* 11.0* 12.8 12.0  HCT 33.2* 32.4* 36.9 34*  MCV 95.1 97.3 97.9  --   PLT 143* 143* 172 188   Lab Results  Component Value Date   TSH 1.496 03/30/2017   No results found for: HGBA1C Lab Results  Component Value Date   CHOL 175 11/10/2014   HDL 53 11/10/2014   LDLCALC 87 11/10/2014   TRIG 173 (A) 11/10/2014   03/02/17 vitamin d 58  Significant Diagnostic Results in last 30 days:  No results found.  Assessment/Plan  1. Benign hypertension with CKD (chronic kidney disease) stage III (HCC) Elevated BP readings with SBP 156-199 and  DBP 95-100 noted. Change hydralazine from 50 mg qid to 100 mg bid with 50 mg at noon. Check BP BID for now. Continue atenolol and irbesartan current dosing. Reviewed recent BMP.  2. GERD without esophagitis Start ranitidine 75 mg bid and monitor  3. Primary osteoarthritis involving multiple joints Continue percocet current regimen with tylenol and lidocaine patch. Therapy consult to evaluate for walker with seat to help with her mobility. Add biofreeze gel tid prn to knee area and finger joints and monitor.    Family/ staff Communication: reviewed care plan with patient and charge nurse.    Labs/tests ordered:  none  Bailey Serve, MD Internal Medicine [facility] [address] [place], [place] [address] Cell Phone (Monday-Friday 8 am - 5 pm): [phone] On Call: [phone] and follow prompts after 5 pm and on weekends Office Phone: [phone] Office Fax: [phone]

## 2017-08-02 DIAGNOSIS — M810 Age-related osteoporosis without current pathological fracture: Secondary | ICD-10-CM | POA: Diagnosis not present

## 2017-08-02 DIAGNOSIS — W19XXXA Unspecified fall, initial encounter: Secondary | ICD-10-CM | POA: Diagnosis not present

## 2017-08-02 DIAGNOSIS — G4700 Insomnia, unspecified: Secondary | ICD-10-CM | POA: Diagnosis not present

## 2017-08-02 DIAGNOSIS — E039 Hypothyroidism, unspecified: Secondary | ICD-10-CM | POA: Diagnosis not present

## 2017-08-02 DIAGNOSIS — R2681 Unsteadiness on feet: Secondary | ICD-10-CM | POA: Diagnosis not present

## 2017-08-02 DIAGNOSIS — R609 Edema, unspecified: Secondary | ICD-10-CM | POA: Diagnosis not present

## 2017-08-02 DIAGNOSIS — D500 Iron deficiency anemia secondary to blood loss (chronic): Secondary | ICD-10-CM | POA: Diagnosis not present

## 2017-08-02 DIAGNOSIS — D649 Anemia, unspecified: Secondary | ICD-10-CM | POA: Diagnosis not present

## 2017-08-02 DIAGNOSIS — S329XXA Fracture of unspecified parts of lumbosacral spine and pelvis, initial encounter for closed fracture: Secondary | ICD-10-CM | POA: Diagnosis not present

## 2017-08-02 DIAGNOSIS — R29898 Other symptoms and signs involving the musculoskeletal system: Secondary | ICD-10-CM | POA: Diagnosis not present

## 2017-08-02 DIAGNOSIS — E7800 Pure hypercholesterolemia, unspecified: Secondary | ICD-10-CM | POA: Diagnosis not present

## 2017-08-02 DIAGNOSIS — S72141A Displaced intertrochanteric fracture of right femur, initial encounter for closed fracture: Secondary | ICD-10-CM | POA: Diagnosis not present

## 2017-08-02 DIAGNOSIS — C50919 Malignant neoplasm of unspecified site of unspecified female breast: Secondary | ICD-10-CM | POA: Diagnosis not present

## 2017-08-02 DIAGNOSIS — Z4789 Encounter for other orthopedic aftercare: Secondary | ICD-10-CM | POA: Diagnosis not present

## 2017-08-02 DIAGNOSIS — M6281 Muscle weakness (generalized): Secondary | ICD-10-CM | POA: Diagnosis not present

## 2017-08-02 DIAGNOSIS — G894 Chronic pain syndrome: Secondary | ICD-10-CM | POA: Diagnosis not present

## 2017-08-02 DIAGNOSIS — M1610 Unilateral primary osteoarthritis, unspecified hip: Secondary | ICD-10-CM | POA: Diagnosis not present

## 2017-08-02 DIAGNOSIS — I10 Essential (primary) hypertension: Secondary | ICD-10-CM | POA: Diagnosis not present

## 2017-08-02 DIAGNOSIS — M1991 Primary osteoarthritis, unspecified site: Secondary | ICD-10-CM | POA: Diagnosis not present

## 2017-08-02 DIAGNOSIS — S72001A Fracture of unspecified part of neck of right femur, initial encounter for closed fracture: Secondary | ICD-10-CM | POA: Diagnosis not present

## 2017-08-07 DIAGNOSIS — G894 Chronic pain syndrome: Secondary | ICD-10-CM | POA: Diagnosis not present

## 2017-08-07 DIAGNOSIS — R2681 Unsteadiness on feet: Secondary | ICD-10-CM | POA: Diagnosis not present

## 2017-08-07 DIAGNOSIS — S72141A Displaced intertrochanteric fracture of right femur, initial encounter for closed fracture: Secondary | ICD-10-CM | POA: Diagnosis not present

## 2017-08-07 DIAGNOSIS — S329XXA Fracture of unspecified parts of lumbosacral spine and pelvis, initial encounter for closed fracture: Secondary | ICD-10-CM | POA: Diagnosis not present

## 2017-08-07 DIAGNOSIS — M6281 Muscle weakness (generalized): Secondary | ICD-10-CM | POA: Diagnosis not present

## 2017-08-07 DIAGNOSIS — R29898 Other symptoms and signs involving the musculoskeletal system: Secondary | ICD-10-CM | POA: Diagnosis not present

## 2017-08-08 DIAGNOSIS — R29898 Other symptoms and signs involving the musculoskeletal system: Secondary | ICD-10-CM | POA: Diagnosis not present

## 2017-08-08 DIAGNOSIS — M6281 Muscle weakness (generalized): Secondary | ICD-10-CM | POA: Diagnosis not present

## 2017-08-08 DIAGNOSIS — G894 Chronic pain syndrome: Secondary | ICD-10-CM | POA: Diagnosis not present

## 2017-08-08 DIAGNOSIS — R2681 Unsteadiness on feet: Secondary | ICD-10-CM | POA: Diagnosis not present

## 2017-08-08 DIAGNOSIS — S72141A Displaced intertrochanteric fracture of right femur, initial encounter for closed fracture: Secondary | ICD-10-CM | POA: Diagnosis not present

## 2017-08-08 DIAGNOSIS — S329XXA Fracture of unspecified parts of lumbosacral spine and pelvis, initial encounter for closed fracture: Secondary | ICD-10-CM | POA: Diagnosis not present

## 2017-08-10 ENCOUNTER — Encounter: Payer: Self-pay | Admitting: *Deleted

## 2017-08-10 DIAGNOSIS — R2681 Unsteadiness on feet: Secondary | ICD-10-CM | POA: Diagnosis not present

## 2017-08-10 DIAGNOSIS — I10 Essential (primary) hypertension: Secondary | ICD-10-CM | POA: Diagnosis not present

## 2017-08-10 DIAGNOSIS — S72141A Displaced intertrochanteric fracture of right femur, initial encounter for closed fracture: Secondary | ICD-10-CM | POA: Diagnosis not present

## 2017-08-10 DIAGNOSIS — M6281 Muscle weakness (generalized): Secondary | ICD-10-CM | POA: Diagnosis not present

## 2017-08-10 DIAGNOSIS — R29898 Other symptoms and signs involving the musculoskeletal system: Secondary | ICD-10-CM | POA: Diagnosis not present

## 2017-08-10 DIAGNOSIS — S329XXA Fracture of unspecified parts of lumbosacral spine and pelvis, initial encounter for closed fracture: Secondary | ICD-10-CM | POA: Diagnosis not present

## 2017-08-10 DIAGNOSIS — G894 Chronic pain syndrome: Secondary | ICD-10-CM | POA: Diagnosis not present

## 2017-08-10 LAB — BASIC METABOLIC PANEL
BUN: 25 — AB (ref 4–21)
CALCIUM: 8.6
CREATININE: 1.03
Glucose: 98
Potassium: 4.2
Sodium: 132

## 2017-08-11 ENCOUNTER — Non-Acute Institutional Stay: Payer: Medicare Other | Admitting: Family

## 2017-08-11 ENCOUNTER — Encounter: Payer: Self-pay | Admitting: Family

## 2017-08-11 DIAGNOSIS — R630 Anorexia: Secondary | ICD-10-CM | POA: Diagnosis not present

## 2017-08-11 DIAGNOSIS — R519 Headache, unspecified: Secondary | ICD-10-CM

## 2017-08-11 DIAGNOSIS — R51 Headache: Secondary | ICD-10-CM | POA: Diagnosis not present

## 2017-08-11 DIAGNOSIS — J3089 Other allergic rhinitis: Secondary | ICD-10-CM

## 2017-08-11 DIAGNOSIS — R531 Weakness: Secondary | ICD-10-CM | POA: Diagnosis not present

## 2017-08-11 DIAGNOSIS — R6889 Other general symptoms and signs: Secondary | ICD-10-CM | POA: Diagnosis not present

## 2017-08-11 LAB — CBC
HCT: 34.5
HEMOGLOBIN: 12.1
WBC: 4.8
platelet count: 148

## 2017-08-11 NOTE — Progress Notes (Signed)
Location:  [facility] Room Number: 35 Place of Service:  ALF [phone]) Provider: Stepen Prins FNP-C  Blanchie Serve, MD  Patient Care Team: Blanchie Serve, MD as PCP - General (Internal Medicine) Kortlyn Koltz, Nelda Bucks, NP as Nurse Practitioner (Family Medicine)  Extended Emergency Contact Information Primary Emergency Contact: Mihalik,[name] Address: [address] [place], [place] [address] [place] Mobile Phone: [phone] Relation: Son  Code Status:  DNR Goals of care: Advanced Directive information Advanced Directives 08/11/2017  Does Patient Have a Medical Advance Directive? Yes  Type of Paramedic of [place];Out of facility DNR (pink MOST or yellow form);Living will  Does patient want to make changes to medical advance directive? -  Copy of [place] in Chart? Yes  Would patient like information on creating a medical advance directive? -  Pre-existing out of facility DNR order (yellow form or pink MOST form) Yellow form placed in chart (order not valid for inpatient use);Pink MOST form placed in chart (order not valid for inpatient use)     Chief Complaint  Patient presents with  . Acute Visit    elevated BP, headache, "feels horrible"    HPI:  Pt is a 84 y.o. female seen today at [facility] for an acute visit for evaluation of high blood pressure,headache,generalized weakness and allergies.she is seen in her room today after returning from seeing a podiatrist.Facility Nurse reports patient had a temp of 103 early this morning and tylenol was given temp down to 96.8 patient states felt like she had chills prior to getting tylenol. Her blood pressure earlier was 141/108 and 197/111 checked with automatic cuff.Blood pressure was rechecked manually by Nurse B/p 160/86 baseline for patient.  Patient also complained of frontal headache but resolved after taking tylenol.currently no  symptoms.though she states has had poor appetite did not eat her breakfast this morning.Patient was snacking on cinnamon roll and coffee during visit.she has also had clear nasal drainage from her chronic allergies.she states had a cough with yellow sputum 2 days ago but resolved.she denies any signs and symptoms of urinary tract infections. Of note she is status post oral antibiotics and topical antibiotics for bilateral stye ordered by ophthalmology.    Past Medical History:  Diagnosis Date  . Acquired cyst of kidney    left  . Allergic rhinitis, cause unspecified   . Anemia, unspecified   . Chronic hyponatremia 09/05/2015  . CKD stage 2 due to type 2 diabetes mellitus ([place]) 11/18/2014  . Closed fracture of unspecified part of vertebral column without mention of spinal cord injury    T7 s/p kyphoplasty, T12  . Edema 2015  . Herpes simplex without mention of complication   . History of Epstein-Barr virus infection   . Hyposmolality and/or hyponatremia    07/13/15 Na 131  . Insomnia   . Insomnia, unspecified   . Left cavernous carotid aneurysm 08/2008   1-1/35mm aneurysm of cavernous carotid artery  . Malignant neoplasm of breast (female), unspecified site 1990   left. Had surgerey and chemo, but no radiation  . 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 Neuropathy   . Osteoarthrosis of knee    bilaterally  . Osteoarthrosis, hip   . Osteoarthrosis, unspecified whether generalized or localized, forearm    bilaterally wrist  . Other and unspecified nonspecific immunological findings  positive ANA  . Other B-complex deficiencies   . Pain in joint, site unspecified    severe, diffuse, chronic pain  . Positive ANA (antinuclear antibody)   . Pulmonary embolism ([place]) 05/07/2015  . Pure hypercholesterolemia   . Senile osteoporosis   . Unspecified essential hypertension   . Unspecified gastritis and gastroduodenitis without mention of hemorrhage     . Unspecified hypothyroidism   . Unspecified vitamin D deficiency    Past Surgical History:  Procedure Laterality Date  . BREAST SURGERY Left   . DILATATION & CURETTAGE/HYSTEROSCOPY WITH MYOSURE N/A 06/27/2017   Procedure: DILATATION & CURETTAGE/HYSTEROSCOPY WITH MYOSURE;  Surgeon: Servando Salina, MD;  Location: [facility];  Service: Gynecology;  Laterality: N/A;  . DILATION AND CURETTAGE OF UTERUS  X 2  . FEMUR IM NAIL Right 03/18/2015   Procedure: INTRAMEDULLARY (IM) NAIL FEMORAL;  Surgeon: Rod Can, MD;  Location: [facility];  Service: Orthopedics;  Laterality: Right;  . FRACTURE SURGERY     hip  . KYPHOPLASTY  07/03/2010   T12  . KYPHOPLASTY     C7  . LAPAROSCOPIC CHOLECYSTECTOMY  09/20/2006  . MASTECTOMY Left 04/29/1989  . MIDDLE EAR SURGERY Bilateral 1938  . NASAL SINUS SURGERY  1990 & 2000  . ORIF HIP FRACTURE Left 06/13/2007   following a syncopal episode  . TONSILLECTOMY  1968    Allergies  Allergen Reactions  . Aspirin     Drop in body temp with large quantities, can tolerate low doses of aspirin  . Penicillins Swelling    Has patient had a PCN reaction causing immediate rash, facial/tongue/throat swelling, SOB or lightheadedness with hypotension: No Has patient had a PCN reaction causing severe rash involving mucus membranes or skin necrosis: No Has patient had a PCN reaction that required hospitalization No Has patient had a PCN reaction occurring within the last 10 years: No If all of the above answers are "NO", then may proceed with Cephalosporin use.    Outpatient Encounter Medications as of 08/11/2017  Medication Sig  . acetaminophen (TYLENOL) 500 MG tablet Take 1,000 mg by mouth at bedtime. (0900 & 1600)  . acetaminophen (TYLENOL) 500 MG tablet Take 500 mg by mouth daily.   Marland Kitchen alum & mag hydroxide-simeth (MINTOX) 200-200-20 MG/5ML suspension Take 5 mLs as needed by mouth for indigestion or heartburn.   Marland Kitchen atenolol (TENORMIN) 25 MG tablet Take 25 mg by mouth at  bedtime. Hold for SBP <110 or HR <60  . calcium citrate (CALCITRATE) 950 MG tablet Take 1 tablet (200 mg of elemental calcium total) by mouth daily.  . cholecalciferol (VITAMIN D) 1000 UNITS tablet Take 3,000 Units by mouth daily at 12 noon. (1200)  . denosumab (PROLIA) 60 MG/ML SOLN injection Inject 60 mg every 6 (six) months into the skin. 10th of sixth month - Administer in upper arm, thigh, or abdomen  . feeding supplement (BOOST / RESOURCE BREEZE) LIQD Take 1 Container by mouth 2 (two) times daily between meals. (1000 & 1600) Prefers Chocolate  . hydrALAZINE (APRESOLINE) 50 MG tablet Take 100 mg by mouth as directed. 100mg  twice daily at 6AM and 10PM; 50 mg daily at [place] for SBP <110  . irbesartan (AVAPRO) 300 MG tablet Take 300 mg by mouth daily. (0800) Hold for BP <90/60  . levothyroxine (SYNTHROID, LEVOTHROID) 150 MCG tablet Take 150 mcg by mouth daily before breakfast. (0730)  . Lidocaine (ASPERCREME LIDOCAINE) 4 % PTCH Apply 3 patches topically once. Apply 1 patch to the left  hip, left knee, and the right shoulder in the morning. Remove in the evening.  . loratadine (CLARITIN) 10 MG tablet Take 1 tablet (10 mg total) by mouth daily.  . Melatonin 3 MG TABS Take 1 tablet (3 mg total) by mouth at bedtime.  . Menthol, Topical Analgesic, (BIOFREEZE) 4 % GEL Apply 1 application topically 3 (three) times daily as needed.  . nystatin-triamcinolone (MYCOLOG II) cream Apply 1 application topically 2 (two) times daily.  Marland Kitchen oxyCODONE-acetaminophen (PERCOCET) 7.5-325 MG tablet Take 1 tablet by mouth 4 (four) times daily. Four times daily scheduled and one at midnight as needed.  . polyethylene glycol (MIRALAX / GLYCOLAX) packet Take 17 g by mouth daily.  . psyllium (METAMUCIL) 58.6 % powder Take 1 packet by mouth daily. Mix in 6 oz of water or juice  . ranitidine (ZANTAC) 75 MG tablet Take 75 mg by mouth 2 (two) times daily.  Marland Kitchen senna-docusate (SENOKOT S) 8.6-50 MG tablet 1 tablet nightly to prevent  constipation  . sodium chloride 1 g tablet 1 tablet daily for hyponatremia  . vitamin B-12 (CYANOCOBALAMIN) 100 MCG tablet Take 1 tablet (100 mcg total) by mouth daily.  Marland Kitchen zinc oxide (BALMEX) 11.3 % CREA cream Apply 1 application topically 2 (two) times daily.  . [DISCONTINUED] doxycycline (DORYX) 100 MG EC tablet Take 100 mg by mouth 2 (two) times daily. Stop date 08/06/17  . [DISCONTINUED] saccharomyces boulardii (FLORASTOR) 250 MG capsule Take 250 mg by mouth 2 (two) times daily. Stop date 08/06/17  . [DISCONTINUED] Tobramycin-Dexamethasone (TOBRADEX OP) Apply 1 application to eye 2 (two) times daily. Both eyes   No facility-administered encounter medications on file as of 08/11/2017.     Review of Systems  Constitutional: Positive for appetite change and fatigue. Negative for chills, fever and unexpected weight change.  HENT: Negative for congestion, postnasal drip, rhinorrhea, sinus pressure, sinus pain, sneezing and sore throat.   Eyes: Positive for visual disturbance. Negative for discharge, redness and itching.       Wears eye glasses   Respiratory: Negative for cough, chest tightness, shortness of breath and wheezing.   Cardiovascular: Negative for chest pain, palpitations and leg swelling.  Gastrointestinal: Negative for abdominal distention, abdominal pain, constipation, diarrhea, nausea and vomiting.  Genitourinary: Negative for dysuria, flank pain, frequency and urgency.  Musculoskeletal: Positive for arthralgias and gait problem.       Chronic knee pain   Skin: Negative for color change, pallor and rash.  Neurological: Negative for dizziness, light-headedness and headaches.       Reports generalized weakness   Psychiatric/Behavioral: Negative for agitation, confusion and sleep disturbance. The patient is not nervous/anxious.     Immunization History  Administered Date(s) Administered  . Influenza Split 02/13/2013  . Influenza-Unspecified 02/27/2014, 02/12/2015, 02/25/2016,  03/08/2017  . PPD Test 09/09/2015  . Pneumococcal Conjugate-13 05/05/2016  . Pneumococcal Polysaccharide-23 02/13/2013  . Td 05/05/2016  . Zoster 05/04/2016   Pertinent  Health Maintenance Due  Topic Date Due  . MAMMOGRAM  09/09/2017 (Originally 03/21/1945)  . INFLUENZA VACCINE  Completed  . DEXA SCAN  Completed  . PNA vac Low Risk Adult  Completed   Fall Risk  01/02/2017 11/10/2015 10/20/2015 10/20/2015 06/30/2015  Falls in the past year? No No Yes No No  Number falls in past yr: - - 2 or more - -  Injury with Fall? - - Yes - -  Risk Factor Category  - - - - -  Risk for fall due to : - -  History of fall(s);Impaired balance/gait - -  Follow up - - - - -    Vitals:   08/11/17 1054  BP: (!) 160/86  Pulse: 66  Resp: 18  Temp: 98.6 F (37 C)  SpO2: 94%  Weight: 130 lb 6.4 oz (59.1 kg)  Height: 5\' 8"  (1.727 m)   Body mass index is 19.83 kg/m. Physical Exam  Constitutional: She is oriented to person, place, and time. She appears well-developed.  Elderly in no acute distress   HENT:  Head: Normocephalic.  Mouth/Throat: Oropharynx is clear and moist. No oropharyngeal exudate.  Eyes: Pupils are equal, round, and reactive to light. Conjunctivae and EOM are normal. Right eye exhibits no discharge. Left eye exhibits no discharge. No scleral icterus.  Bilateral upper eye lid stye swelling has improved.No redness,tenderness or drainage noted.    Neck: Normal range of motion. No JVD present. No thyromegaly present.  Cardiovascular: Normal rate, regular rhythm, normal heart sounds and intact distal pulses. Exam reveals no gallop and no friction rub.  No murmur heard. Pulmonary/Chest: Effort normal and breath sounds normal. No respiratory distress. She has no wheezes. She has no rales.  Abdominal: Soft. Bowel sounds are normal. She exhibits no distension. There is no tenderness. There is no rebound and no guarding.  Musculoskeletal: She exhibits no tenderness.  Moves x 4 extremities except  bilateral knee ROM limited due to pain.UE/LE strength strong and equal.unsteady gait uses FWW.arthritic changes to fingers.  Lymphadenopathy:    She has no cervical adenopathy.  Neurological: She is oriented to person, place, and time. Coordination normal.  Skin: Skin is warm and dry. No rash noted. No erythema.  Psychiatric: She has a normal mood and affect. Her speech is normal and behavior is normal. Judgment and thought content normal.   Labs reviewed: Recent Labs    03/30/17 0604 03/30/17 1016 03/31/17 0557 04/03/17  06/27/17 1135 07/03/17 07/13/17 08/10/17  NA 127*  --  127* 127   < > 128* 130* 128*  128 132  K 4.0  --  4.1 4.0   < > 4.2 4.3 3.9  3.9 4.2  CL 97*  --  97* 96  --  97*  --  97  --   CO2 19*  --  24  --   --  23  --  24  --   GLUCOSE 114*  --  96  --   --  114*  --   --   --   BUN 21*  --  21* 24*   < > 23* 21 21  21  25*  CREATININE 1.03*  --  1.15* 1.12   < > 1.06* 1.0 1.0  0.98 1.03  CALCIUM 8.7*  --  8.4* 8.4  --  8.6*  --  8.2  8.2 8.6  MG  --  2.1  --   --   --   --   --   --   --   PHOS  --  2.6  --   --   --   --   --   --   --    < > = values in this interval not displayed.   Recent Labs    03/29/17 03/31/17 0557 07/03/17  AST 15 19 26   ALT 11 13* 17  ALKPHOS 47 47 45  BILITOT 0.8 0.9  --   PROT 6.0 6.2*  --   ALBUMIN 3.6 3.3*  --    Recent Labs  03/30/17 0604 03/31/17 0557 06/27/17 1135 07/03/17 08/11/17  WBC 5.2 4.5 5.2 6.2 4.8  HGB 11.8* 11.0* 12.8 12.0 12.1  HCT 33.2* 32.4* 36.9 34* 34.5  MCV 95.1 97.3 97.9  --   --   PLT 143* 143* 172 188  --    Lab Results  Component Value Date   TSH 1.496 03/30/2017   No results found for: HGBA1C Lab Results  Component Value Date   CHOL 175 11/10/2014   HDL 53 11/10/2014   LDLCALC 87 11/10/2014   TRIG 173 (A) 11/10/2014    Significant Diagnostic Results in last 30 days:  No results found.  Assessment/Plan 1. Generalized weakness Fever reports early in the morning prior to visit  but resolved with tylenol.No signs of UTI.cough 2 days ago but resolved.bilateral lung sounds CTA.will obtain stat CBC/diff to rule out infectious etiologies.her monthly BMP obtained 08/10/2017.continue to monitor.    2. Frontal headache Resolved with tylenol.suspect could be from her elevated blood pressure. Blood pressure medication given.continue to monitor.   3. Poor appetite Did not eat breakfast well but was eating cinnamon roll and her coffee during visit.continue to encourage oral intake and hydration.Stat CBC/diff as above.   4. Non-seasonal allergic rhinitis, unspecified trigger Chronic.stable.Negative exam findings.continue on loratadine 10 mg tablet daily.  Family/ staff Communication: Reviewed plan of care with patient and facility Nurse supervisor  Labs/tests ordered: Stat CBC/diff  Addendum:  CBC/diff resulted no signs of infections.   Recent Labs    03/30/17 0604 03/31/17 0557 06/27/17 1135 07/03/17 08/11/17  WBC 5.2 4.5 5.2 6.2 4.8  HGB 11.8* 11.0* 12.8 12.0 12.1  HCT 33.2* 32.4* 36.9 34* 34.5  MCV 95.1 97.3 97.9  --   --   PLT 143* 143* 172 188  --    Chasyn Cinque C Kynzlee Hucker, NP

## 2017-08-14 DIAGNOSIS — R609 Edema, unspecified: Secondary | ICD-10-CM | POA: Diagnosis not present

## 2017-08-14 DIAGNOSIS — I10 Essential (primary) hypertension: Secondary | ICD-10-CM | POA: Diagnosis not present

## 2017-08-14 DIAGNOSIS — E7800 Pure hypercholesterolemia, unspecified: Secondary | ICD-10-CM | POA: Diagnosis not present

## 2017-08-14 DIAGNOSIS — G4700 Insomnia, unspecified: Secondary | ICD-10-CM | POA: Diagnosis not present

## 2017-08-14 DIAGNOSIS — D649 Anemia, unspecified: Secondary | ICD-10-CM | POA: Diagnosis not present

## 2017-08-14 DIAGNOSIS — S329XXA Fracture of unspecified parts of lumbosacral spine and pelvis, initial encounter for closed fracture: Secondary | ICD-10-CM | POA: Diagnosis not present

## 2017-08-14 DIAGNOSIS — M6281 Muscle weakness (generalized): Secondary | ICD-10-CM | POA: Diagnosis not present

## 2017-08-14 DIAGNOSIS — R2681 Unsteadiness on feet: Secondary | ICD-10-CM | POA: Diagnosis not present

## 2017-08-14 DIAGNOSIS — C50919 Malignant neoplasm of unspecified site of unspecified female breast: Secondary | ICD-10-CM | POA: Diagnosis not present

## 2017-08-14 DIAGNOSIS — S72001A Fracture of unspecified part of neck of right femur, initial encounter for closed fracture: Secondary | ICD-10-CM | POA: Diagnosis not present

## 2017-08-14 DIAGNOSIS — E039 Hypothyroidism, unspecified: Secondary | ICD-10-CM | POA: Diagnosis not present

## 2017-08-14 DIAGNOSIS — D500 Iron deficiency anemia secondary to blood loss (chronic): Secondary | ICD-10-CM | POA: Diagnosis not present

## 2017-08-14 DIAGNOSIS — M810 Age-related osteoporosis without current pathological fracture: Secondary | ICD-10-CM | POA: Diagnosis not present

## 2017-08-14 DIAGNOSIS — R1319 Other dysphagia: Secondary | ICD-10-CM | POA: Diagnosis not present

## 2017-08-14 DIAGNOSIS — M1991 Primary osteoarthritis, unspecified site: Secondary | ICD-10-CM | POA: Diagnosis not present

## 2017-08-14 DIAGNOSIS — R1310 Dysphagia, unspecified: Secondary | ICD-10-CM | POA: Diagnosis not present

## 2017-08-14 DIAGNOSIS — W19XXXA Unspecified fall, initial encounter: Secondary | ICD-10-CM | POA: Diagnosis not present

## 2017-08-14 DIAGNOSIS — Z4789 Encounter for other orthopedic aftercare: Secondary | ICD-10-CM | POA: Diagnosis not present

## 2017-08-14 DIAGNOSIS — S72141A Displaced intertrochanteric fracture of right femur, initial encounter for closed fracture: Secondary | ICD-10-CM | POA: Diagnosis not present

## 2017-08-14 DIAGNOSIS — M1610 Unilateral primary osteoarthritis, unspecified hip: Secondary | ICD-10-CM | POA: Diagnosis not present

## 2017-08-15 DIAGNOSIS — S329XXA Fracture of unspecified parts of lumbosacral spine and pelvis, initial encounter for closed fracture: Secondary | ICD-10-CM | POA: Diagnosis not present

## 2017-08-15 DIAGNOSIS — R1319 Other dysphagia: Secondary | ICD-10-CM | POA: Diagnosis not present

## 2017-08-15 DIAGNOSIS — R2681 Unsteadiness on feet: Secondary | ICD-10-CM | POA: Diagnosis not present

## 2017-08-15 DIAGNOSIS — S72141A Displaced intertrochanteric fracture of right femur, initial encounter for closed fracture: Secondary | ICD-10-CM | POA: Diagnosis not present

## 2017-08-15 DIAGNOSIS — M6281 Muscle weakness (generalized): Secondary | ICD-10-CM | POA: Diagnosis not present

## 2017-08-15 DIAGNOSIS — R1310 Dysphagia, unspecified: Secondary | ICD-10-CM | POA: Diagnosis not present

## 2017-08-17 ENCOUNTER — Encounter: Payer: Self-pay | Admitting: Internal Medicine

## 2017-08-17 DIAGNOSIS — R1319 Other dysphagia: Secondary | ICD-10-CM | POA: Diagnosis not present

## 2017-08-17 DIAGNOSIS — S329XXA Fracture of unspecified parts of lumbosacral spine and pelvis, initial encounter for closed fracture: Secondary | ICD-10-CM | POA: Diagnosis not present

## 2017-08-17 DIAGNOSIS — M6281 Muscle weakness (generalized): Secondary | ICD-10-CM | POA: Diagnosis not present

## 2017-08-17 DIAGNOSIS — R1310 Dysphagia, unspecified: Secondary | ICD-10-CM | POA: Diagnosis not present

## 2017-08-17 DIAGNOSIS — S72141A Displaced intertrochanteric fracture of right femur, initial encounter for closed fracture: Secondary | ICD-10-CM | POA: Diagnosis not present

## 2017-08-17 DIAGNOSIS — R2681 Unsteadiness on feet: Secondary | ICD-10-CM | POA: Diagnosis not present

## 2017-08-18 NOTE — Telephone Encounter (Signed)
Please see Mychart message from patient's son.

## 2017-08-21 ENCOUNTER — Non-Acute Institutional Stay: Payer: Medicare Other | Admitting: Family

## 2017-08-21 ENCOUNTER — Encounter: Payer: Self-pay | Admitting: Family

## 2017-08-21 DIAGNOSIS — M150 Primary generalized (osteo)arthritis: Secondary | ICD-10-CM

## 2017-08-21 DIAGNOSIS — N183 Chronic kidney disease, stage 3 (moderate): Secondary | ICD-10-CM | POA: Diagnosis not present

## 2017-08-21 DIAGNOSIS — R1310 Dysphagia, unspecified: Secondary | ICD-10-CM | POA: Diagnosis not present

## 2017-08-21 DIAGNOSIS — I129 Hypertensive chronic kidney disease with stage 1 through stage 4 chronic kidney disease, or unspecified chronic kidney disease: Secondary | ICD-10-CM

## 2017-08-21 DIAGNOSIS — S329XXA Fracture of unspecified parts of lumbosacral spine and pelvis, initial encounter for closed fracture: Secondary | ICD-10-CM | POA: Diagnosis not present

## 2017-08-21 DIAGNOSIS — M6281 Muscle weakness (generalized): Secondary | ICD-10-CM | POA: Diagnosis not present

## 2017-08-21 DIAGNOSIS — S72141A Displaced intertrochanteric fracture of right femur, initial encounter for closed fracture: Secondary | ICD-10-CM | POA: Diagnosis not present

## 2017-08-21 DIAGNOSIS — R1319 Other dysphagia: Secondary | ICD-10-CM | POA: Diagnosis not present

## 2017-08-21 DIAGNOSIS — R2681 Unsteadiness on feet: Secondary | ICD-10-CM | POA: Diagnosis not present

## 2017-08-21 DIAGNOSIS — M159 Polyosteoarthritis, unspecified: Secondary | ICD-10-CM

## 2017-08-21 NOTE — Progress Notes (Signed)
Location:  [facility] Room Number: 35 Place of Service:  ALF [phone]) Provider: Carrine Kroboth FNP-C  Blanchie Serve, MD  Patient Care Team: Blanchie Serve, MD as PCP - General (Internal Medicine) Shay Bartoli, Nelda Bucks, NP as Nurse Practitioner (Family Medicine)  Extended Emergency Contact Information Primary Emergency Contact: Zehren,[name] Address: [address] [place], [place] [address] [place] Mobile Phone: [phone] Relation: Son  Code Status:  DNR Goals of care: Advanced Directive information Advanced Directives 08/21/2017  Does Patient Have a Medical Advance Directive? Yes  Type of Paramedic of [place];Out of facility DNR (pink MOST or yellow form);Living will  Does patient want to make changes to medical advance directive? -  Copy of [place] in Chart? Yes  Would patient like information on creating a medical advance directive? -  Pre-existing out of facility DNR order (yellow form or pink MOST form) Yellow form placed in chart (order not valid for inpatient use);Pink MOST form placed in chart (order not valid for inpatient use)     Chief Complaint  Patient presents with  . Acute Visit    High blood pressure,Knee pain     HPI:  Pt is a 84 y.o. female seen today at [facility] for an acute visit for evaluation of high blood pressure and bilateral knee pain.She is seen in her room today with Nurse supervisor present at bedside.she complains of bilateral knee pain rating 6-7 out of 10 on scale.she states current Percocet 7.5-325 mg tablet every 6 hours ineffective.she request stronger pain medication.Pain prevents her from walking.Facility Nurse reports patient's blood pressure has been elevated.Blood pressure log reviewed readings in the 130's/70's -160's/80's in the morning and 110's/70's-150's/80's in the afternoon.she attributes high blood pressure with her increased  pain.she has noted occasional headache. Nurse reports patient had nausea and vomiting of clear on 08/16/2017 and has no appetite.No further nausea or vomiting since then.She denies any fever,chills or cough.       Past Medical History:  Diagnosis Date  . Acquired cyst of kidney    left  . Allergic rhinitis, cause unspecified   . Anemia, unspecified   . Chronic hyponatremia 09/05/2015  . CKD stage 2 due to type 2 diabetes mellitus ([place]) 11/18/2014  . Closed fracture of unspecified part of vertebral column without mention of spinal cord injury    T7 s/p kyphoplasty, T12  . Edema 2015  . Herpes simplex without mention of complication   . History of Epstein-Barr virus infection   . Hyposmolality and/or hyponatremia    07/13/15 Na 131  . Insomnia   . Insomnia, unspecified   . Left cavernous carotid aneurysm 08/2008   1-1/25mm aneurysm of cavernous carotid artery  . Malignant neoplasm of breast (female), unspecified site 1990   left. Had surgerey and chemo, but no radiation  . 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 Neuropathy   . Osteoarthrosis of knee    bilaterally  . Osteoarthrosis, hip   . Osteoarthrosis, unspecified whether generalized or localized, forearm    bilaterally wrist  . Other and unspecified nonspecific immunological findings    positive ANA  . Other B-complex deficiencies   . Pain in joint, site unspecified    severe, diffuse, chronic pain  . Positive ANA (antinuclear antibody)   . Pulmonary embolism ([place]) 05/07/2015  . Pure hypercholesterolemia   .  Senile osteoporosis   . Unspecified essential hypertension   . Unspecified gastritis and gastroduodenitis without mention of hemorrhage   . Unspecified hypothyroidism   . Unspecified vitamin D deficiency    Past Surgical History:  Procedure Laterality Date  . BREAST SURGERY Left   . DILATATION & CURETTAGE/HYSTEROSCOPY WITH MYOSURE N/A 06/27/2017   Procedure: DILATATION &  CURETTAGE/HYSTEROSCOPY WITH MYOSURE;  Surgeon: Servando Salina, MD;  Location: [facility];  Service: Gynecology;  Laterality: N/A;  . DILATION AND CURETTAGE OF UTERUS  X 2  . FEMUR IM NAIL Right 03/18/2015   Procedure: INTRAMEDULLARY (IM) NAIL FEMORAL;  Surgeon: Rod Can, MD;  Location: [facility];  Service: Orthopedics;  Laterality: Right;  . FRACTURE SURGERY     hip  . KYPHOPLASTY  07/03/2010   T12  . KYPHOPLASTY     C7  . LAPAROSCOPIC CHOLECYSTECTOMY  09/20/2006  . MASTECTOMY Left 04/29/1989  . MIDDLE EAR SURGERY Bilateral 1938  . NASAL SINUS SURGERY  1990 & 2000  . ORIF HIP FRACTURE Left 06/13/2007   following a syncopal episode  . TONSILLECTOMY  1968    Allergies  Allergen Reactions  . Aspirin     Drop in body temp with large quantities, can tolerate low doses of aspirin  . Penicillins Swelling    Has patient had a PCN reaction causing immediate rash, facial/tongue/throat swelling, SOB or lightheadedness with hypotension: No Has patient had a PCN reaction causing severe rash involving mucus membranes or skin necrosis: No Has patient had a PCN reaction that required hospitalization No Has patient had a PCN reaction occurring within the last 10 years: No If all of the above answers are "NO", then may proceed with Cephalosporin use.    Outpatient Encounter Medications as of 08/21/2017  Medication Sig  . acetaminophen (TYLENOL) 500 MG tablet Take 1,000 mg by mouth at bedtime.   Marland Kitchen acetaminophen (TYLENOL) 500 MG tablet Take 500 mg by mouth daily.   Marland Kitchen alum & mag hydroxide-simeth (MINTOX) 200-200-20 MG/5ML suspension Take 5 mLs as needed by mouth for indigestion or heartburn.   Marland Kitchen atenolol (TENORMIN) 25 MG tablet Take 25 mg by mouth at bedtime. Hold for SBP <110 or HR <60  . calcium citrate (CALCITRATE) 950 MG tablet Take 1 tablet (200 mg of elemental calcium total) by mouth daily.  . cholecalciferol (VITAMIN D) 1000 UNITS tablet Take 3,000 Units by mouth daily at 12 noon. (1200)  .  feeding supplement (BOOST / RESOURCE BREEZE) LIQD Take 1 Container by mouth 2 (two) times daily between meals. (1000 & 1600) Prefers Chocolate  . hydrALAZINE (APRESOLINE) 50 MG tablet Take 100 mg by mouth as directed. 100mg  twice daily at 6AM and 10PM; 50 mg daily at [place] for SBP <110  . irbesartan (AVAPRO) 300 MG tablet Take 300 mg by mouth daily. (0800) Hold for BP <90/60  . levothyroxine (SYNTHROID, LEVOTHROID) 150 MCG tablet Take 150 mcg by mouth daily before breakfast. (0730)  . Lidocaine (ASPERCREME LIDOCAINE) 4 % PTCH Apply 3 patches topically once. Apply 1 patch to the left hip, left knee, and the right shoulder in the morning. Remove in the evening.  . loratadine (CLARITIN) 10 MG tablet Take 1 tablet (10 mg total) by mouth daily.  . Melatonin 3 MG TABS Take 1 tablet (3 mg total) by mouth at bedtime.  . Menthol, Topical Analgesic, (BIOFREEZE) 4 % GEL Apply 1 application topically 3 (three) times daily as needed.  . nystatin-triamcinolone (MYCOLOG II) cream Apply 1 application topically  2 (two) times daily.  Marland Kitchen oxyCODONE-acetaminophen (PERCOCET) 7.5-325 MG tablet Take 1 tablet by mouth 4 (four) times daily. Four times daily scheduled and one at midnight as needed.  . polyethylene glycol (MIRALAX / GLYCOLAX) packet Take 17 g by mouth daily.  . psyllium (METAMUCIL) 58.6 % powder Take 1 packet by mouth daily. Mix in 6 oz of water or juice  . ranitidine (ZANTAC) 75 MG tablet Take 75 mg by mouth 2 (two) times daily.  Marland Kitchen senna-docusate (SENOKOT S) 8.6-50 MG tablet 1 tablet nightly to prevent constipation  . sodium chloride 1 g tablet 1 tablet daily for hyponatremia  . vitamin B-12 (CYANOCOBALAMIN) 100 MCG tablet Take 1 tablet (100 mcg total) by mouth daily.  Marland Kitchen zinc oxide (BALMEX) 11.3 % CREA cream Apply 1 application topically 2 (two) times daily.  Marland Kitchen denosumab (PROLIA) 60 MG/ML SOLN injection Inject 60 mg every 6 (six) months into the skin. 10th of sixth month - Administer in upper arm, thigh,  or abdomen   No facility-administered encounter medications on file as of 08/21/2017.     Review of Systems  Constitutional: Negative for chills, fatigue and fever.  HENT: Negative for congestion, rhinorrhea, sinus pressure, sinus pain, sneezing and sore throat.   Eyes: Positive for visual disturbance. Negative for discharge, redness and itching.       Wears eye glasses   Respiratory: Negative for cough, chest tightness, shortness of breath and wheezing.   Cardiovascular: Negative for chest pain, palpitations and leg swelling.  Gastrointestinal: Negative for abdominal distention, abdominal pain, diarrhea, nausea and vomiting.  Genitourinary: Negative for dysuria, frequency and urgency.  Musculoskeletal: Positive for arthralgias and gait problem.       Pain worst on knees   Skin: Negative for color change, pallor and rash.  Neurological: Negative for dizziness, light-headedness and headaches.  Psychiatric/Behavioral: Negative for agitation, confusion and sleep disturbance. The patient is not nervous/anxious.     Immunization History  Administered Date(s) Administered  . Influenza Split 02/13/2013  . Influenza-Unspecified 02/27/2014, 02/12/2015, 02/25/2016, 03/08/2017  . PPD Test 09/09/2015  . Pneumococcal Conjugate-13 05/05/2016  . Pneumococcal Polysaccharide-23 02/13/2013  . Td 05/05/2016  . Zoster 05/04/2016   Pertinent  Health Maintenance Due  Topic Date Due  . MAMMOGRAM  09/09/2017 (Originally 03/21/1945)  . INFLUENZA VACCINE  12/14/2017  . DEXA SCAN  Completed  . PNA vac Low Risk Adult  Completed   Fall Risk  01/02/2017 11/10/2015 10/20/2015 10/20/2015 06/30/2015  Falls in the past year? No No Yes No No  Number falls in past yr: - - 2 or more - -  Injury with Fall? - - Yes - -  Risk Factor Category  - - - - -  Risk for fall due to : - - History of fall(s);Impaired balance/gait - -  Follow up - - - - -    Vitals:   08/21/17 1105  BP: (!) 148/87  Pulse: 60  Resp: 18  Temp:  97.9 F (36.6 C)  SpO2: 98%  Weight: 126 lb 6.4 oz (57.3 kg)  Height: 5\' 8"  (1.727 m)   Body mass index is 19.22 kg/m. Physical Exam  Constitutional: She is oriented to person, place, and time. She appears well-developed.  Elderly in no acute distress  HENT:  Head: Normocephalic.  Mouth/Throat: Oropharynx is clear and moist. No oropharyngeal exudate.  Eyes: Pupils are equal, round, and reactive to light. Conjunctivae and EOM are normal. Right eye exhibits no discharge. Left eye exhibits no discharge. No  scleral icterus.  Eye glasses in place   Neck: Normal range of motion. No JVD present. No thyromegaly present.  Cardiovascular: Normal rate, regular rhythm, normal heart sounds and intact distal pulses. Exam reveals no gallop and no friction rub.  No murmur heard. Pulmonary/Chest: Effort normal and breath sounds normal. No respiratory distress. She has no wheezes. She has no rales.  Abdominal: Soft. Bowel sounds are normal. She exhibits no distension. There is no tenderness. There is no rebound and no guarding.  Genitourinary:  Genitourinary Comments: Continent   Musculoskeletal: She exhibits no edema or tenderness.  Moves x 4 extremities except limited ROM to both knees due to pain.  Lymphadenopathy:    She has no cervical adenopathy.  Neurological: She is oriented to person, place, and time. Gait abnormal.  Skin: Skin is warm and dry. No rash noted. No erythema. No pallor.  Psychiatric: She has a normal mood and affect. Her speech is normal and behavior is normal. Judgment and thought content normal. Cognition and memory are normal.    Labs reviewed: Recent Labs    03/30/17 0604 03/30/17 1016 03/31/17 0557 04/03/17  06/27/17 1135 07/03/17 07/13/17 08/10/17  NA 127*  --  127* 127   < > 128* 130* 128*  128 132  K 4.0  --  4.1 4.0   < > 4.2 4.3 3.9  3.9 4.2  CL 97*  --  97* 96  --  97*  --  97  --   CO2 19*  --  24  --   --  23  --  24  --   GLUCOSE 114*  --  96  --   --   114*  --   --   --   BUN 21*  --  21* 24*   < > 23* 21 21  21  25*  CREATININE 1.03*  --  1.15* 1.12   < > 1.06* 1.0 1.0  0.98 1.03  CALCIUM 8.7*  --  8.4* 8.4  --  8.6*  --  8.2  8.2 8.6  MG  --  2.1  --   --   --   --   --   --   --   PHOS  --  2.6  --   --   --   --   --   --   --    < > = values in this interval not displayed.   Recent Labs    03/29/17 03/31/17 0557 07/03/17  AST 15 19 26   ALT 11 13* 17  ALKPHOS 47 47 45  BILITOT 0.8 0.9  --   PROT 6.0 6.2*  --   ALBUMIN 3.6 3.3*  --    Recent Labs    03/30/17 0604 03/31/17 0557 06/27/17 1135 07/03/17 08/11/17  WBC 5.2 4.5 5.2 6.2 4.8  HGB 11.8* 11.0* 12.8 12.0 12.1  HCT 33.2* 32.4* 36.9 34* 34.5  MCV 95.1 97.3 97.9  --   --   PLT 143* 143* 172 188  --    Lab Results  Component Value Date   TSH 1.496 03/30/2017   No results found for: HGBA1C Lab Results  Component Value Date   CHOL 175 11/10/2014   HDL 53 11/10/2014   LDLCALC 87 11/10/2014   TRIG 173 (A) 11/10/2014    Significant Diagnostic Results in last 30 days:  No results found.  Assessment/Plan 1. Benign hypertension with CKD (chronic kidney disease) stage III (H[facility]) Blood pressure log  reviewed several SBP > 160's.Pain could be a contributory factor.will continue on hydralazine 100 mg tablet twice daily and 50 mg tablet daily at 2 Pm and Avapro 300 mg tablet daily.Will increase Atenolol to 50 mg tablet daily hold if SBP < 110 or HR < 60 b/min.Continue to monitor blood pressure  And HR.    2. Primary osteoarthritis involving multiple joints Pain worst on knees.Pain affecting her ADL's.continue on lidocaine 4% patch,Biofreeze three times daily as needed.Increase Percocet to 10-325 mg tablet one by mouth four times daily and one by mouth daily at 12 Am for severe pain as needed.continue to monitor.will add Cymbalta if pain still not under control.   Family/ staff Communication: Reviewed plan of care with Dr.Pandey, patient and facility Nurse supervisor.    Labs/tests ordered: None   Rayola Everhart C Citlaly Camplin, NP

## 2017-08-22 ENCOUNTER — Encounter: Payer: Self-pay | Admitting: Internal Medicine

## 2017-08-22 DIAGNOSIS — S72141A Displaced intertrochanteric fracture of right femur, initial encounter for closed fracture: Secondary | ICD-10-CM | POA: Diagnosis not present

## 2017-08-22 DIAGNOSIS — M6281 Muscle weakness (generalized): Secondary | ICD-10-CM | POA: Diagnosis not present

## 2017-08-22 DIAGNOSIS — S329XXA Fracture of unspecified parts of lumbosacral spine and pelvis, initial encounter for closed fracture: Secondary | ICD-10-CM | POA: Diagnosis not present

## 2017-08-22 DIAGNOSIS — R1310 Dysphagia, unspecified: Secondary | ICD-10-CM | POA: Diagnosis not present

## 2017-08-22 DIAGNOSIS — R1319 Other dysphagia: Secondary | ICD-10-CM | POA: Diagnosis not present

## 2017-08-22 DIAGNOSIS — R2681 Unsteadiness on feet: Secondary | ICD-10-CM | POA: Diagnosis not present

## 2017-08-22 NOTE — Telephone Encounter (Signed)
Patient's son Mr.[name] Gama called in the presence of facility charge Nurse.Patient's son concerned that MD was trying to cut down on pain medication but pain medication increased yesterday.I've explain to patient's POA that patient was seen 08/21/2017 due to complains of increased pain.Pain medication previous decreased but dose had to be increased due to patient's uncontrolled pain.POA states just wanted to make sure MD was aware of pain medication adjustment since MD was weaning off  Medication.Notified POA that dose adjustment was discussed with MD and agrees with plan.If pain medication not effective will plan to add Cymbalta for depression/pain.Patient's POA verbalized understanding and was thankful for the care we provide for the mother.

## 2017-08-24 DIAGNOSIS — R1319 Other dysphagia: Secondary | ICD-10-CM | POA: Diagnosis not present

## 2017-08-24 DIAGNOSIS — R1310 Dysphagia, unspecified: Secondary | ICD-10-CM | POA: Diagnosis not present

## 2017-08-24 DIAGNOSIS — R2681 Unsteadiness on feet: Secondary | ICD-10-CM | POA: Diagnosis not present

## 2017-08-24 DIAGNOSIS — S72141A Displaced intertrochanteric fracture of right femur, initial encounter for closed fracture: Secondary | ICD-10-CM | POA: Diagnosis not present

## 2017-08-24 DIAGNOSIS — S329XXA Fracture of unspecified parts of lumbosacral spine and pelvis, initial encounter for closed fracture: Secondary | ICD-10-CM | POA: Diagnosis not present

## 2017-08-24 DIAGNOSIS — M6281 Muscle weakness (generalized): Secondary | ICD-10-CM | POA: Diagnosis not present

## 2017-08-28 DIAGNOSIS — R2681 Unsteadiness on feet: Secondary | ICD-10-CM | POA: Diagnosis not present

## 2017-08-28 DIAGNOSIS — R1319 Other dysphagia: Secondary | ICD-10-CM | POA: Diagnosis not present

## 2017-08-28 DIAGNOSIS — S72141A Displaced intertrochanteric fracture of right femur, initial encounter for closed fracture: Secondary | ICD-10-CM | POA: Diagnosis not present

## 2017-08-28 DIAGNOSIS — S329XXA Fracture of unspecified parts of lumbosacral spine and pelvis, initial encounter for closed fracture: Secondary | ICD-10-CM | POA: Diagnosis not present

## 2017-08-28 DIAGNOSIS — M6281 Muscle weakness (generalized): Secondary | ICD-10-CM | POA: Diagnosis not present

## 2017-08-28 DIAGNOSIS — R1310 Dysphagia, unspecified: Secondary | ICD-10-CM | POA: Diagnosis not present

## 2017-08-29 ENCOUNTER — Non-Acute Institutional Stay: Payer: Medicare Other | Admitting: Family

## 2017-08-29 ENCOUNTER — Encounter: Payer: Self-pay | Admitting: Family

## 2017-08-29 DIAGNOSIS — R110 Nausea: Secondary | ICD-10-CM

## 2017-08-29 DIAGNOSIS — R634 Abnormal weight loss: Secondary | ICD-10-CM | POA: Diagnosis not present

## 2017-08-29 DIAGNOSIS — R630 Anorexia: Secondary | ICD-10-CM | POA: Diagnosis not present

## 2017-08-29 DIAGNOSIS — S72141A Displaced intertrochanteric fracture of right femur, initial encounter for closed fracture: Secondary | ICD-10-CM | POA: Diagnosis not present

## 2017-08-29 DIAGNOSIS — R2681 Unsteadiness on feet: Secondary | ICD-10-CM | POA: Diagnosis not present

## 2017-08-29 DIAGNOSIS — S329XXA Fracture of unspecified parts of lumbosacral spine and pelvis, initial encounter for closed fracture: Secondary | ICD-10-CM | POA: Diagnosis not present

## 2017-08-29 DIAGNOSIS — M6281 Muscle weakness (generalized): Secondary | ICD-10-CM | POA: Diagnosis not present

## 2017-08-29 DIAGNOSIS — R1310 Dysphagia, unspecified: Secondary | ICD-10-CM | POA: Diagnosis not present

## 2017-08-29 DIAGNOSIS — R1319 Other dysphagia: Secondary | ICD-10-CM | POA: Diagnosis not present

## 2017-08-29 NOTE — Progress Notes (Signed)
Location:  [place] Room Number: 35 Place of Service:  ALF [phone]) Provider: Dinah Ngetich FNP-C  Blanchie Serve, MD  Patient Care Team: Blanchie Serve, MD as PCP - General (Internal Medicine) Ngetich, Nelda Bucks, NP as Nurse Practitioner (Family Medicine)  Extended Emergency Contact Information Primary Emergency Contact: Westra,[name] Address: [address] [place], [place] [address] [place] Mobile Phone: [phone] Relation: Son  Code Status:  DNR Goals of care: Advanced Directive information Advanced Directives 08/29/2017  Does Patient Have a Medical Advance Directive? Yes  Type of Paramedic of [place];Out of facility DNR (pink MOST or yellow form);Living will  Does patient want to make changes to medical advance directive? -  Copy of [place] in Chart? Yes  Would patient like information on creating a medical advance directive? -  Pre-existing out of facility DNR order (yellow form or pink MOST form) Yellow form placed in chart (order not valid for inpatient use);Pink MOST form placed in chart (order not valid for inpatient use)     Chief Complaint  Patient presents with  . Acute Visit    continued nausea    HPI:  Pt is a 84 y.o. female seen today at [facility] for an acute visit for evaluation of nausea.she is seen in her room today with facility Nurse at bedside.Nurse reports patient continues to have issues with nausea requiring phenergan suppository.patient also reports having poor appetite " when I see food I just feel nauseous".She states prefers to drink protein supplements,creamy soup and soft foods.she denies any problems with chewing or swallowing but states the vegetables tends to be hard for her to chew.she denies any fever,chills,abdominal pain or constipation.she further denies any feelings of depression.    Past Medical History:  Diagnosis Date  .  Acquired cyst of kidney    left  . Allergic rhinitis, cause unspecified   . Anemia, unspecified   . Chronic hyponatremia 09/05/2015  . CKD stage 2 due to type 2 diabetes mellitus ([place]) 11/18/2014  . Closed fracture of unspecified part of vertebral column without mention of spinal cord injury    T7 s/p kyphoplasty, T12  . Edema 2015  . Herpes simplex without mention of complication   . History of Epstein-Barr virus infection   . Hyposmolality and/or hyponatremia    07/13/15 Na 131  . Insomnia   . Insomnia, unspecified   . Left cavernous carotid aneurysm 08/2008   1-1/39mm aneurysm of cavernous carotid artery  . Malignant neoplasm of breast (female), unspecified site 1990   left. Had surgerey and chemo, but no radiation  . 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 Neuropathy   . Osteoarthrosis of knee    bilaterally  . Osteoarthrosis, hip   . Osteoarthrosis, unspecified whether generalized or localized, forearm    bilaterally wrist  . Other and unspecified nonspecific immunological findings    positive ANA  . Other B-complex deficiencies   . Pain in joint, site unspecified    severe, diffuse, chronic pain  . Positive ANA (antinuclear antibody)   . Pulmonary embolism ([place]) 05/07/2015  . Pure hypercholesterolemia   . Senile osteoporosis   . Unspecified essential hypertension   . Unspecified gastritis and gastroduodenitis without mention of hemorrhage   . Unspecified hypothyroidism   . Unspecified vitamin D deficiency    Past Surgical History:  Procedure Laterality Date  . BREAST SURGERY Left   . DILATATION & CURETTAGE/HYSTEROSCOPY WITH MYOSURE N/A 06/27/2017   Procedure: DILATATION & CURETTAGE/HYSTEROSCOPY WITH MYOSURE;  Surgeon: Servando Salina, MD;  Location: [facility];  Service: Gynecology;  Laterality: N/A;  . DILATION AND CURETTAGE OF UTERUS  X 2  . FEMUR IM NAIL Right 03/18/2015   Procedure: INTRAMEDULLARY (IM) NAIL FEMORAL;   Surgeon: Rod Can, MD;  Location: [facility];  Service: Orthopedics;  Laterality: Right;  . FRACTURE SURGERY     hip  . KYPHOPLASTY  07/03/2010   T12  . KYPHOPLASTY     C7  . LAPAROSCOPIC CHOLECYSTECTOMY  09/20/2006  . MASTECTOMY Left 04/29/1989  . MIDDLE EAR SURGERY Bilateral 1938  . NASAL SINUS SURGERY  1990 & 2000  . ORIF HIP FRACTURE Left 06/13/2007   following a syncopal episode  . TONSILLECTOMY  1968    Allergies  Allergen Reactions  . Aspirin     Drop in body temp with large quantities, can tolerate low doses of aspirin  . Penicillins Swelling    Has patient had a PCN reaction causing immediate rash, facial/tongue/throat swelling, SOB or lightheadedness with hypotension: No Has patient had a PCN reaction causing severe rash involving mucus membranes or skin necrosis: No Has patient had a PCN reaction that required hospitalization No Has patient had a PCN reaction occurring within the last 10 years: No If all of the above answers are "NO", then may proceed with Cephalosporin use.    Outpatient Encounter Medications as of 08/29/2017  Medication Sig  . acetaminophen (TYLENOL) 500 MG tablet Take 1,000 mg by mouth at bedtime.   Marland Kitchen acetaminophen (TYLENOL) 500 MG tablet Take 500 mg by mouth daily.   Marland Kitchen alum & mag hydroxide-simeth (MINTOX) 200-200-20 MG/5ML suspension Take 5 mLs as needed by mouth for indigestion or heartburn.   Marland Kitchen atenolol (TENORMIN) 50 MG tablet Take 50 mg by mouth daily. Hold for SBP <110 or HR <60  . calcium citrate (CALCITRATE) 950 MG tablet Take 1 tablet (200 mg of elemental calcium total) by mouth daily.  . cholecalciferol (VITAMIN D) 1000 UNITS tablet Take 3,000 Units by mouth daily at 12 noon. (1200)  . feeding supplement (BOOST / RESOURCE BREEZE) LIQD Take 1 Container by mouth 2 (two) times daily between meals. (1000 & 1600) Prefers Chocolate  . hydrALAZINE (APRESOLINE) 50 MG tablet Take 100 mg by mouth as directed. 100mg  twice daily at 6AM and 10PM; 50 mg  daily at [place] for SBP <110  . irbesartan (AVAPRO) 300 MG tablet Take 300 mg by mouth daily. (0800) Hold for BP <90/60  . levothyroxine (SYNTHROID, LEVOTHROID) 150 MCG tablet Take 150 mcg by mouth daily before breakfast. (0730)  . Lidocaine (ASPERCREME LIDOCAINE) 4 % PTCH Apply 3 patches topically once. Apply 1 patch to the left hip, left knee, and the right shoulder in the morning. Remove in the evening.  . loratadine (CLARITIN) 10 MG tablet Take 1 tablet (10 mg total) by mouth daily.  . Melatonin 3 MG TABS Take 1 tablet (3 mg total) by mouth at bedtime.  . Menthol, Topical Analgesic, (BIOFREEZE) 4 % GEL Apply 1 application topically 3 (three) times daily as needed.  . nystatin-triamcinolone (MYCOLOG II) cream Apply 1 application topically 2 (two) times daily.  Marland Kitchen oxyCODONE-acetaminophen (PERCOCET) 10-325 MG tablet Take 1 tablet by mouth as directed. Take one tablet four times daily and one tablet at 12AM if needed  . polyethylene glycol (MIRALAX / GLYCOLAX) packet  Take 17 g by mouth daily.  . psyllium (METAMUCIL) 58.6 % powder Take 1 packet by mouth daily. Mix in 6 oz of water or juice  . ranitidine (ZANTAC) 75 MG tablet Take 75 mg by mouth 2 (two) times daily.  Marland Kitchen senna-docusate (SENOKOT S) 8.6-50 MG tablet 1 tablet nightly to prevent constipation  . sodium chloride 1 g tablet 1 tablet daily for hyponatremia  . vitamin B-12 (CYANOCOBALAMIN) 100 MCG tablet Take 1 tablet (100 mcg total) by mouth daily.  Marland Kitchen zinc oxide (BALMEX) 11.3 % CREA cream Apply 1 application topically 2 (two) times daily.  Marland Kitchen denosumab (PROLIA) 60 MG/ML SOLN injection Inject 60 mg every 6 (six) months into the skin. 10th of sixth month - Administer in upper arm, thigh, or abdomen  . [DISCONTINUED] atenolol (TENORMIN) 25 MG tablet Take 25 mg by mouth at bedtime. Hold for SBP <110 or HR <60  . [DISCONTINUED] oxyCODONE-acetaminophen (PERCOCET) 7.5-325 MG tablet Take 1 tablet by mouth 4 (four) times daily. Four times daily  scheduled and one at midnight as needed.   No facility-administered encounter medications on file as of 08/29/2017.     Review of Systems  Constitutional: Positive for appetite change and unexpected weight change. Negative for activity change, chills, fatigue and fever.  HENT: Positive for dental problem. Negative for congestion, rhinorrhea, sinus pressure, sinus pain, sneezing, sore throat and trouble swallowing.        Wears upper and bottom dentures   Eyes: Negative for discharge, redness and itching.  Respiratory: Negative for cough, chest tightness, shortness of breath and wheezing.   Cardiovascular: Negative for chest pain, palpitations and leg swelling.  Gastrointestinal: Positive for nausea. Negative for abdominal distention, abdominal pain, constipation, diarrhea and vomiting.  Endocrine: Negative for cold intolerance, heat intolerance, polydipsia, polyphagia and polyuria.  Genitourinary: Negative for dysuria, flank pain, frequency and urgency.  Musculoskeletal: Positive for arthralgias and gait problem.  Skin: Negative for color change, pallor, rash and wound.  Neurological: Negative for dizziness, weakness, light-headedness and headaches.  Psychiatric/Behavioral: Negative for agitation, confusion and sleep disturbance. The patient is not nervous/anxious.     Immunization History  Administered Date(s) Administered  . Influenza Split 02/13/2013  . Influenza-Unspecified 02/27/2014, 02/12/2015, 02/25/2016, 03/08/2017  . PPD Test 09/09/2015  . Pneumococcal Conjugate-13 05/05/2016  . Pneumococcal Polysaccharide-23 02/13/2013  . Td 05/05/2016  . Zoster 05/04/2016   Pertinent  Health Maintenance Due  Topic Date Due  . MAMMOGRAM  09/09/2017 (Originally 03/21/1945)  . INFLUENZA VACCINE  12/14/2017  . DEXA SCAN  Completed  . PNA vac Low Risk Adult  Completed   Fall Risk  01/02/2017 11/10/2015 10/20/2015 10/20/2015 06/30/2015  Falls in the past year? No No Yes No No  Number falls in  past yr: - - 2 or more - -  Injury with Fall? - - Yes - -  Risk Factor Category  - - - - -  Risk for fall due to : - - History of fall(s);Impaired balance/gait - -  Follow up - - - - -    Vitals:   08/29/17 1139  BP: (!) 136/97  Pulse: (!) 55  Resp: 19  Temp: 97.8 F (36.6 C)  SpO2: 96%  Weight: 126 lb 6.4 oz (57.3 kg)  Height: 5\' 8"  (1.727 m)   Body mass index is 19.22 kg/m. Physical Exam  Constitutional: She is oriented to person, place, and time. She appears well-developed.  Frail elderly in no acute distress   HENT:  Head: Normocephalic.  Mouth/Throat: Oropharynx is clear and moist. No oropharyngeal exudate.  Eyes: Pupils are equal, round, and reactive to light. Conjunctivae and EOM are normal. Right eye exhibits no discharge. Left eye exhibits no discharge. No scleral icterus.  Neck: Normal range of motion. No JVD present. No thyromegaly present.  Cardiovascular: Normal rate, normal heart sounds and intact distal pulses. Exam reveals no gallop and no friction rub.  No murmur heard. Pulmonary/Chest: Effort normal and breath sounds normal. No respiratory distress. She has no wheezes. She has no rales.  Abdominal: Soft. Bowel sounds are normal. She exhibits no distension. There is no tenderness. There is no rebound and no guarding.  Genitourinary:  Genitourinary Comments: Continent   Musculoskeletal: She exhibits no edema or tenderness.  Unsteady gait uses walker.UE/LE strength 4/5  Lymphadenopathy:    She has no cervical adenopathy.  Neurological: She is oriented to person, place, and time. Gait abnormal. Coordination normal.  Skin: Skin is warm and dry. No rash noted. No erythema. No pallor.  Psychiatric: She has a normal mood and affect. Her speech is normal and behavior is normal. Judgment and thought content normal.    Labs reviewed: Recent Labs    03/30/17 0604 03/30/17 1016 03/31/17 0557 04/03/17  06/27/17 1135 07/03/17 07/13/17 08/10/17  NA 127*  --  127*  127   < > 128* 130* 128*  128 132  K 4.0  --  4.1 4.0   < > 4.2 4.3 3.9  3.9 4.2  CL 97*  --  97* 96  --  97*  --  97  --   CO2 19*  --  24  --   --  23  --  24  --   GLUCOSE 114*  --  96  --   --  114*  --   --   --   BUN 21*  --  21* 24*   < > 23* 21 21  21  25*  CREATININE 1.03*  --  1.15* 1.12   < > 1.06* 1.0 1.0  0.98 1.03  CALCIUM 8.7*  --  8.4* 8.4  --  8.6*  --  8.2  8.2 8.6  MG  --  2.1  --   --   --   --   --   --   --   PHOS  --  2.6  --   --   --   --   --   --   --    < > = values in this interval not displayed.   Recent Labs    03/29/17 03/31/17 0557 07/03/17  AST 15 19 26   ALT 11 13* 17  ALKPHOS 47 47 45  BILITOT 0.8 0.9  --   PROT 6.0 6.2*  --   ALBUMIN 3.6 3.3*  --    Recent Labs    03/30/17 0604 03/31/17 0557 06/27/17 1135 07/03/17 08/11/17  WBC 5.2 4.5 5.2 6.2 4.8  HGB 11.8* 11.0* 12.8 12.0 12.1  HCT 33.2* 32.4* 36.9 34* 34.5  MCV 95.1 97.3 97.9  --   --   PLT 143* 143* 172 188  --    Lab Results  Component Value Date   TSH 1.496 03/30/2017   No results found for: HGBA1C Lab Results  Component Value Date   CHOL 175 11/10/2014   HDL 53 11/10/2014   LDLCALC 87 11/10/2014   TRIG 173 (A) 11/10/2014    Significant Diagnostic Results in last 30 days:  No results  found.  Assessment/Plan  Nausea Afebrile.Negative exam findings.Nausea worst when she sees food.Prefers creamy soup,soft food and protein supplements.will try working with Speech therapy for food consistency and chewing.Regiestered Dietician to evaluate as indicated.continue on Phenergan 25 mg suppository as needed.will obtain CMP if nausea persist. check CBC/diff and CMP to rule out infectious and metabolic etiologies.   Poor appetite Her nausea could be contributing to her poor appetite.Remeron recommended but patient's states doesn't want more pills.Regiestered Dietician to evaluate as indicated for protein supplements.  Weight loss  Has had progressive weight loss: Wt 138 lbs (  05/16/2017) Wt 132 lbs ( 06/16/2017) Wt 130.4 lbs ( 07/14/2017) Wt 126.4 lbs ( 08/14/2017) Regiestered Dietician to evaluate as indicated for protein supplements.continue to encourage oral intake.check CBC/diff,CMP and TSH  08/31/2017 to rule out infectious and metabolic etiologies.   Family/ staff Communication: Reviewed plan of care with patient,Patient's Son Glennis Brink) and facility Nurse.  Labs/tests ordered: CBC/diff,CMP and TSH level 08/31/2017   Sandrea Hughs, NP

## 2017-08-30 DIAGNOSIS — M6281 Muscle weakness (generalized): Secondary | ICD-10-CM | POA: Diagnosis not present

## 2017-08-30 DIAGNOSIS — R1310 Dysphagia, unspecified: Secondary | ICD-10-CM | POA: Diagnosis not present

## 2017-08-30 DIAGNOSIS — R2681 Unsteadiness on feet: Secondary | ICD-10-CM | POA: Diagnosis not present

## 2017-08-30 DIAGNOSIS — S72141A Displaced intertrochanteric fracture of right femur, initial encounter for closed fracture: Secondary | ICD-10-CM | POA: Diagnosis not present

## 2017-08-30 DIAGNOSIS — S329XXA Fracture of unspecified parts of lumbosacral spine and pelvis, initial encounter for closed fracture: Secondary | ICD-10-CM | POA: Diagnosis not present

## 2017-08-30 DIAGNOSIS — R1319 Other dysphagia: Secondary | ICD-10-CM | POA: Diagnosis not present

## 2017-08-31 ENCOUNTER — Encounter: Payer: Self-pay | Admitting: *Deleted

## 2017-08-31 DIAGNOSIS — R630 Anorexia: Secondary | ICD-10-CM | POA: Diagnosis not present

## 2017-08-31 DIAGNOSIS — R7989 Other specified abnormal findings of blood chemistry: Secondary | ICD-10-CM | POA: Diagnosis not present

## 2017-08-31 DIAGNOSIS — R946 Abnormal results of thyroid function studies: Secondary | ICD-10-CM | POA: Diagnosis not present

## 2017-08-31 DIAGNOSIS — R634 Abnormal weight loss: Secondary | ICD-10-CM | POA: Diagnosis not present

## 2017-08-31 DIAGNOSIS — R6889 Other general symptoms and signs: Secondary | ICD-10-CM | POA: Diagnosis not present

## 2017-08-31 DIAGNOSIS — R110 Nausea: Secondary | ICD-10-CM | POA: Diagnosis not present

## 2017-08-31 LAB — COMPLETE METABOLIC PANEL WITH GFR
ALBUMIN: 3.3
ALK PHOS: 53
ALT: 1.57
AST: 20
BUN: 24 — AB (ref 4–21)
Calcium: 8.7
Creat: 1.05
Glucose: 104
Potassium: 4.1
SODIUM: 127
TOTAL PROTEIN: 5.5 g/dL
Total Bilirubin: 0.8

## 2017-08-31 LAB — CBC
HCT: 32.1
HGB: 11.4
WBC: 7
platelet count: 158

## 2017-08-31 LAB — TSH: TSH: 1.57

## 2017-09-04 DIAGNOSIS — S329XXA Fracture of unspecified parts of lumbosacral spine and pelvis, initial encounter for closed fracture: Secondary | ICD-10-CM | POA: Diagnosis not present

## 2017-09-04 DIAGNOSIS — R1319 Other dysphagia: Secondary | ICD-10-CM | POA: Diagnosis not present

## 2017-09-04 DIAGNOSIS — R1310 Dysphagia, unspecified: Secondary | ICD-10-CM | POA: Diagnosis not present

## 2017-09-04 DIAGNOSIS — M6281 Muscle weakness (generalized): Secondary | ICD-10-CM | POA: Diagnosis not present

## 2017-09-04 DIAGNOSIS — S72141A Displaced intertrochanteric fracture of right femur, initial encounter for closed fracture: Secondary | ICD-10-CM | POA: Diagnosis not present

## 2017-09-04 DIAGNOSIS — R2681 Unsteadiness on feet: Secondary | ICD-10-CM | POA: Diagnosis not present

## 2017-09-05 DIAGNOSIS — R2681 Unsteadiness on feet: Secondary | ICD-10-CM | POA: Diagnosis not present

## 2017-09-05 DIAGNOSIS — R1319 Other dysphagia: Secondary | ICD-10-CM | POA: Diagnosis not present

## 2017-09-05 DIAGNOSIS — S329XXA Fracture of unspecified parts of lumbosacral spine and pelvis, initial encounter for closed fracture: Secondary | ICD-10-CM | POA: Diagnosis not present

## 2017-09-05 DIAGNOSIS — S72141A Displaced intertrochanteric fracture of right femur, initial encounter for closed fracture: Secondary | ICD-10-CM | POA: Diagnosis not present

## 2017-09-05 DIAGNOSIS — R1310 Dysphagia, unspecified: Secondary | ICD-10-CM | POA: Diagnosis not present

## 2017-09-05 DIAGNOSIS — M6281 Muscle weakness (generalized): Secondary | ICD-10-CM | POA: Diagnosis not present

## 2017-09-06 DIAGNOSIS — R1310 Dysphagia, unspecified: Secondary | ICD-10-CM | POA: Diagnosis not present

## 2017-09-06 DIAGNOSIS — R2681 Unsteadiness on feet: Secondary | ICD-10-CM | POA: Diagnosis not present

## 2017-09-06 DIAGNOSIS — R1319 Other dysphagia: Secondary | ICD-10-CM | POA: Diagnosis not present

## 2017-09-06 DIAGNOSIS — M6281 Muscle weakness (generalized): Secondary | ICD-10-CM | POA: Diagnosis not present

## 2017-09-06 DIAGNOSIS — S329XXA Fracture of unspecified parts of lumbosacral spine and pelvis, initial encounter for closed fracture: Secondary | ICD-10-CM | POA: Diagnosis not present

## 2017-09-06 DIAGNOSIS — S72141A Displaced intertrochanteric fracture of right femur, initial encounter for closed fracture: Secondary | ICD-10-CM | POA: Diagnosis not present

## 2017-09-07 DIAGNOSIS — I10 Essential (primary) hypertension: Secondary | ICD-10-CM | POA: Diagnosis not present

## 2017-09-07 LAB — BASIC METABOLIC PANEL
BUN: 21 (ref 4–21)
CREATININE: 1.06
Calcium: 9
GLUCOSE: 101
Potassium: 4.1
Sodium: 126

## 2017-09-08 ENCOUNTER — Encounter: Payer: Self-pay | Admitting: *Deleted

## 2017-09-11 DIAGNOSIS — R1319 Other dysphagia: Secondary | ICD-10-CM | POA: Diagnosis not present

## 2017-09-11 DIAGNOSIS — S72141A Displaced intertrochanteric fracture of right femur, initial encounter for closed fracture: Secondary | ICD-10-CM | POA: Diagnosis not present

## 2017-09-11 DIAGNOSIS — R1310 Dysphagia, unspecified: Secondary | ICD-10-CM | POA: Diagnosis not present

## 2017-09-11 DIAGNOSIS — R2681 Unsteadiness on feet: Secondary | ICD-10-CM | POA: Diagnosis not present

## 2017-09-11 DIAGNOSIS — M6281 Muscle weakness (generalized): Secondary | ICD-10-CM | POA: Diagnosis not present

## 2017-09-11 DIAGNOSIS — S329XXA Fracture of unspecified parts of lumbosacral spine and pelvis, initial encounter for closed fracture: Secondary | ICD-10-CM | POA: Diagnosis not present

## 2017-09-12 DIAGNOSIS — R1310 Dysphagia, unspecified: Secondary | ICD-10-CM | POA: Diagnosis not present

## 2017-09-12 DIAGNOSIS — M6281 Muscle weakness (generalized): Secondary | ICD-10-CM | POA: Diagnosis not present

## 2017-09-12 DIAGNOSIS — S329XXA Fracture of unspecified parts of lumbosacral spine and pelvis, initial encounter for closed fracture: Secondary | ICD-10-CM | POA: Diagnosis not present

## 2017-09-12 DIAGNOSIS — R1319 Other dysphagia: Secondary | ICD-10-CM | POA: Diagnosis not present

## 2017-09-12 DIAGNOSIS — S72141A Displaced intertrochanteric fracture of right femur, initial encounter for closed fracture: Secondary | ICD-10-CM | POA: Diagnosis not present

## 2017-09-12 DIAGNOSIS — R2681 Unsteadiness on feet: Secondary | ICD-10-CM | POA: Diagnosis not present

## 2017-09-14 DIAGNOSIS — M1991 Primary osteoarthritis, unspecified site: Secondary | ICD-10-CM | POA: Diagnosis not present

## 2017-09-14 DIAGNOSIS — N182 Chronic kidney disease, stage 2 (mild): Secondary | ICD-10-CM | POA: Diagnosis not present

## 2017-09-14 DIAGNOSIS — S72001A Fracture of unspecified part of neck of right femur, initial encounter for closed fracture: Secondary | ICD-10-CM | POA: Diagnosis not present

## 2017-09-14 DIAGNOSIS — D649 Anemia, unspecified: Secondary | ICD-10-CM | POA: Diagnosis not present

## 2017-09-14 DIAGNOSIS — Z4789 Encounter for other orthopedic aftercare: Secondary | ICD-10-CM | POA: Diagnosis not present

## 2017-09-14 DIAGNOSIS — M6281 Muscle weakness (generalized): Secondary | ICD-10-CM | POA: Diagnosis not present

## 2017-09-14 DIAGNOSIS — M1610 Unilateral primary osteoarthritis, unspecified hip: Secondary | ICD-10-CM | POA: Diagnosis not present

## 2017-09-14 DIAGNOSIS — D500 Iron deficiency anemia secondary to blood loss (chronic): Secondary | ICD-10-CM | POA: Diagnosis not present

## 2017-09-14 DIAGNOSIS — E7800 Pure hypercholesterolemia, unspecified: Secondary | ICD-10-CM | POA: Diagnosis not present

## 2017-09-14 DIAGNOSIS — C50919 Malignant neoplasm of unspecified site of unspecified female breast: Secondary | ICD-10-CM | POA: Diagnosis not present

## 2017-09-14 DIAGNOSIS — E039 Hypothyroidism, unspecified: Secondary | ICD-10-CM | POA: Diagnosis not present

## 2017-09-14 DIAGNOSIS — R2681 Unsteadiness on feet: Secondary | ICD-10-CM | POA: Diagnosis not present

## 2017-09-14 DIAGNOSIS — R609 Edema, unspecified: Secondary | ICD-10-CM | POA: Diagnosis not present

## 2017-09-14 DIAGNOSIS — S72141A Displaced intertrochanteric fracture of right femur, initial encounter for closed fracture: Secondary | ICD-10-CM | POA: Diagnosis not present

## 2017-09-14 DIAGNOSIS — S329XXA Fracture of unspecified parts of lumbosacral spine and pelvis, initial encounter for closed fracture: Secondary | ICD-10-CM | POA: Diagnosis not present

## 2017-09-14 DIAGNOSIS — I10 Essential (primary) hypertension: Secondary | ICD-10-CM | POA: Diagnosis not present

## 2017-09-14 DIAGNOSIS — G4700 Insomnia, unspecified: Secondary | ICD-10-CM | POA: Diagnosis not present

## 2017-09-14 DIAGNOSIS — R42 Dizziness and giddiness: Secondary | ICD-10-CM | POA: Diagnosis not present

## 2017-09-14 DIAGNOSIS — W19XXXA Unspecified fall, initial encounter: Secondary | ICD-10-CM | POA: Diagnosis not present

## 2017-09-14 DIAGNOSIS — M810 Age-related osteoporosis without current pathological fracture: Secondary | ICD-10-CM | POA: Diagnosis not present

## 2017-09-18 DIAGNOSIS — M6281 Muscle weakness (generalized): Secondary | ICD-10-CM | POA: Diagnosis not present

## 2017-09-18 DIAGNOSIS — S329XXA Fracture of unspecified parts of lumbosacral spine and pelvis, initial encounter for closed fracture: Secondary | ICD-10-CM | POA: Diagnosis not present

## 2017-09-18 DIAGNOSIS — R42 Dizziness and giddiness: Secondary | ICD-10-CM | POA: Diagnosis not present

## 2017-09-18 DIAGNOSIS — S72141A Displaced intertrochanteric fracture of right femur, initial encounter for closed fracture: Secondary | ICD-10-CM | POA: Diagnosis not present

## 2017-09-18 DIAGNOSIS — R2681 Unsteadiness on feet: Secondary | ICD-10-CM | POA: Diagnosis not present

## 2017-09-18 DIAGNOSIS — N182 Chronic kidney disease, stage 2 (mild): Secondary | ICD-10-CM | POA: Diagnosis not present

## 2017-09-19 DIAGNOSIS — R42 Dizziness and giddiness: Secondary | ICD-10-CM | POA: Diagnosis not present

## 2017-09-19 DIAGNOSIS — R2681 Unsteadiness on feet: Secondary | ICD-10-CM | POA: Diagnosis not present

## 2017-09-19 DIAGNOSIS — S72141A Displaced intertrochanteric fracture of right femur, initial encounter for closed fracture: Secondary | ICD-10-CM | POA: Diagnosis not present

## 2017-09-19 DIAGNOSIS — M6281 Muscle weakness (generalized): Secondary | ICD-10-CM | POA: Diagnosis not present

## 2017-09-19 DIAGNOSIS — N182 Chronic kidney disease, stage 2 (mild): Secondary | ICD-10-CM | POA: Diagnosis not present

## 2017-09-19 DIAGNOSIS — S329XXA Fracture of unspecified parts of lumbosacral spine and pelvis, initial encounter for closed fracture: Secondary | ICD-10-CM | POA: Diagnosis not present

## 2017-09-20 ENCOUNTER — Non-Acute Institutional Stay: Payer: Medicare Other | Admitting: Internal Medicine

## 2017-09-20 ENCOUNTER — Encounter: Payer: Self-pay | Admitting: Internal Medicine

## 2017-09-20 VITALS — BP 128/70 | HR 60 | Temp 97.5°F | Resp 18 | Ht 68.0 in | Wt 123.8 lb

## 2017-09-20 DIAGNOSIS — K219 Gastro-esophageal reflux disease without esophagitis: Secondary | ICD-10-CM

## 2017-09-20 DIAGNOSIS — L8943 Pressure ulcer of contiguous site of back, buttock and hip, stage 3: Secondary | ICD-10-CM | POA: Diagnosis not present

## 2017-09-20 DIAGNOSIS — E538 Deficiency of other specified B group vitamins: Secondary | ICD-10-CM | POA: Diagnosis not present

## 2017-09-20 DIAGNOSIS — E039 Hypothyroidism, unspecified: Secondary | ICD-10-CM

## 2017-09-20 DIAGNOSIS — H6121 Impacted cerumen, right ear: Secondary | ICD-10-CM | POA: Diagnosis not present

## 2017-09-20 DIAGNOSIS — E871 Hypo-osmolality and hyponatremia: Secondary | ICD-10-CM | POA: Diagnosis not present

## 2017-09-20 DIAGNOSIS — N183 Chronic kidney disease, stage 3 unspecified: Secondary | ICD-10-CM

## 2017-09-20 DIAGNOSIS — J3089 Other allergic rhinitis: Secondary | ICD-10-CM | POA: Diagnosis not present

## 2017-09-20 DIAGNOSIS — G894 Chronic pain syndrome: Secondary | ICD-10-CM | POA: Diagnosis not present

## 2017-09-20 DIAGNOSIS — G4700 Insomnia, unspecified: Secondary | ICD-10-CM

## 2017-09-20 DIAGNOSIS — M810 Age-related osteoporosis without current pathological fracture: Secondary | ICD-10-CM

## 2017-09-20 DIAGNOSIS — K5909 Other constipation: Secondary | ICD-10-CM | POA: Diagnosis not present

## 2017-09-20 DIAGNOSIS — I129 Hypertensive chronic kidney disease with stage 1 through stage 4 chronic kidney disease, or unspecified chronic kidney disease: Secondary | ICD-10-CM

## 2017-09-20 NOTE — Progress Notes (Signed)
Location:  [place] of Service:  Clinic [phone]) Provider:  Blanchie Serve MD  Bailey Serve, MD  Patient Care Team: Bailey Serve, MD as PCP - General (Internal Medicine) Bailey [name], Bailey Bucks, NP as Nurse Practitioner [facility])  Extended Emergency Contact Information Primary Emergency Contact: Bailey [name],Bailey [name] Address: [address] [place], [name] [address] [place] Mobile Phone: [phone] Relation: Son  Code Status:  DNR  Goals of care: Advanced Directive information Advanced Directives 08/29/2017  Does Patient Have a Medical Advance Directive? Yes  Type of Paramedic of [place];Out of facility DNR (pink MOST or yellow form);Living will  Does patient want to make changes to medical advance directive? -  Copy of [place] in Chart? Yes  Would patient like information on creating a medical advance directive? -  Pre-existing out of facility DNR order (yellow form or pink MOST form) Yellow form placed in chart (order not valid for inpatient use);Pink MOST form placed in chart (order not valid for inpatient use)     Chief Complaint  Patient presents with  . Medical Management of Chronic Issues    3 month follow up, pain, soreness to bottom, medications discussion, HTN    HPI:  Pt is a 84 y.o. female seen today for medical management of chronic diseases. Her son is present this visit. She continues to lose weight. Appetite is fair. Taking nutritional supplement. Has now developed pressure ulcer to her bottom.   Insomnia- sleeping better now that the pain is better controlled. Does not feel melatonin is helping much  Chronic pain- mainly to her knees, some to low back as well. Currently on tylenol 1000 mg qhs and 500 mg in am along with percocet 10-325 mg qid with midnight dosing if needed  gerd- controlled symptom, currently on ranitidine 75 mg bid.   Hypertension- takes atenolol  50 mg daily and hydralazine 100 mg bid with 50 mg at noon. Also on irbesartan 300 mg daily  Constipation- with her on opioids, takes miralax daily and senokot, these have been helpful   Osteoporosis- takes prolia injection and calcium with vitamin d  Chronic hyponatremia- stable, on salt tablet.   Hypothyroidism- taking levothyroxine 150 mcg daily  Past Medical History:  Diagnosis Date  . Acquired cyst of kidney    left  . Allergic rhinitis, cause unspecified   . Anemia, unspecified   . Chronic hyponatremia 09/05/2015  . CKD stage 2 due to type 2 diabetes mellitus ([place]) 11/18/2014  . Closed fracture of unspecified part of vertebral column without mention of spinal cord injury    T7 s/p kyphoplasty, T12  . Edema 2015  . Herpes simplex without mention of complication   . History of Epstein-Barr virus infection   . Hyposmolality and/or hyponatremia    07/13/15 Na 131  . Insomnia   . Insomnia, unspecified   . Left cavernous carotid aneurysm 08/2008   1-1/78mm aneurysm of cavernous carotid artery  . Malignant neoplasm of breast (female), unspecified site 1990   left. Had surgerey and chemo, but no radiation  . 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 Neuropathy   . Osteoarthrosis of knee    bilaterally  . Osteoarthrosis, hip   . Osteoarthrosis, unspecified whether generalized or localized, forearm    bilaterally wrist  . Other and unspecified nonspecific immunological findings  positive ANA  . Other B-complex deficiencies   . Pain in joint, site unspecified    severe, diffuse, chronic pain  . Positive ANA (antinuclear antibody)   . Pulmonary embolism ([place]) 05/07/2015  . Pure hypercholesterolemia   . Senile osteoporosis   . Unspecified essential hypertension   . Unspecified gastritis and gastroduodenitis without mention of hemorrhage   . Unspecified hypothyroidism   . Unspecified vitamin D deficiency    Past Surgical History:    Procedure Laterality Date  . BREAST SURGERY Left   . DILATATION & CURETTAGE/HYSTEROSCOPY WITH MYOSURE N/A 06/27/2017   Procedure: DILATATION & CURETTAGE/HYSTEROSCOPY WITH MYOSURE;  Surgeon: Servando Salina, MD;  Location: [facility];  Service: Gynecology;  Laterality: N/A;  . DILATION AND CURETTAGE OF UTERUS  X 2  . FEMUR IM NAIL Right 03/18/2015   Procedure: INTRAMEDULLARY (IM) NAIL FEMORAL;  Surgeon: Rod Can, MD;  Location: [facility];  Service: Orthopedics;  Laterality: Right;  . FRACTURE SURGERY     hip  . KYPHOPLASTY  07/03/2010   T12  . KYPHOPLASTY     C7  . LAPAROSCOPIC CHOLECYSTECTOMY  09/20/2006  . MASTECTOMY Left 04/29/1989  . MIDDLE EAR SURGERY Bilateral 1938  . NASAL SINUS SURGERY  1990 & 2000  . ORIF HIP FRACTURE Left 06/13/2007   following a syncopal episode  . TONSILLECTOMY  1968    Allergies  Allergen Reactions  . Aspirin     Drop in body temp with large quantities, can tolerate low doses of aspirin  . Penicillins Swelling    Has patient had a PCN reaction causing immediate rash, facial/tongue/throat swelling, SOB or lightheadedness with hypotension: No Has patient had a PCN reaction causing severe rash involving mucus membranes or skin necrosis: No Has patient had a PCN reaction that required hospitalization No Has patient had a PCN reaction occurring within the last 10 years: No If all of the above answers are "NO", then may proceed with Cephalosporin use.    Outpatient Encounter Medications as of 09/20/2017  Medication Sig  . acetaminophen (TYLENOL) 325 MG tablet Take 650 mg by mouth every 4 (four) hours as needed for fever.  Marland Kitchen acetaminophen (TYLENOL) 500 MG tablet Take 1,000 mg by mouth at bedtime.   Marland Kitchen acetaminophen (TYLENOL) 500 MG tablet Take 500 mg by mouth daily.   Marland Kitchen alum & mag hydroxide-simeth (MINTOX) 200-200-20 MG/5ML suspension Take 5 mLs as needed by mouth for indigestion or heartburn.   . Amino Acids-Protein Hydrolys (FEEDING SUPPLEMENT, PRO-STAT  SUGAR FREE 64,) LIQD Take 30 mLs by mouth 3 (three) times daily with meals. Stop date 09/25/17  . atenolol (TENORMIN) 50 MG tablet Take 50 mg by mouth at bedtime. Hold for SBP <110 or HR <60   . calcium citrate (CALCITRATE) 950 MG tablet Take 1 tablet (200 mg of elemental calcium total) by mouth daily.  . cholecalciferol (VITAMIN D) 1000 UNITS tablet Take 3,000 Units by mouth daily at 12 noon. (1200)  . denosumab (PROLIA) 60 MG/ML SOLN injection Inject 60 mg every 6 (six) months into the skin. 10th of sixth month - Administer in upper arm, thigh, or abdomen  . feeding supplement (BOOST / RESOURCE BREEZE) LIQD Take 1 Container by mouth 2 (two) times daily between meals. (1000 & 1600) Prefers Chocolate  . hydrALAZINE (APRESOLINE) 50 MG tablet Take 100 mg by mouth as directed. 100mg  twice daily at 6AM and 10PM; 50 mg daily at [place] for SBP <110  . irbesartan (AVAPRO) 300 MG tablet Take 300  mg by mouth daily. (0800) Hold for BP <90/60  . levothyroxine (SYNTHROID, LEVOTHROID) 150 MCG tablet Take 150 mcg by mouth daily before breakfast. (0730)  . Lidocaine (ASPERCREME LIDOCAINE) 4 % PTCH Apply 3 patches topically once. Apply 1 patch to the left hip, left knee, and the right shoulder in the morning. Remove in the evening.  . loratadine (CLARITIN) 10 MG tablet Take 1 tablet (10 mg total) by mouth daily.  . Melatonin 3 MG TABS Take 1 tablet (3 mg total) by mouth at bedtime.  . Menthol, Topical Analgesic, (BIOFREEZE) 4 % GEL Apply 1 application topically 3 (three) times daily as needed.  . nystatin-triamcinolone (MYCOLOG II) cream Apply 1 application topically 2 (two) times daily as needed.   Marland Kitchen oxyCODONE-acetaminophen (PERCOCET) 10-325 MG tablet Take 1 tablet by mouth as directed. Take one tablet four times daily and one tablet at 12AM if needed  . polyethylene glycol (MIRALAX / GLYCOLAX) packet Take 17 g by mouth daily.  . psyllium (METAMUCIL) 58.6 % powder Take 1 packet by mouth daily. Mix in 6 oz of  water or juice  . ranitidine (ZANTAC) 75 MG tablet Take 75 mg by mouth 2 (two) times daily.  Marland Kitchen senna-docusate (SENOKOT S) 8.6-50 MG tablet 1 tablet nightly to prevent constipation  . SILVER SULFADIAZINE EX Apply topically. 1 foam bandage to each cheek as needed  . sodium chloride 1 g tablet 1 tablet daily for hyponatremia  . UNABLE TO FIND Med Name: Hydrocolloid dressing 4x4. Change dressing to the coccyx as needed. Check patency daily  . vitamin B-12 (CYANOCOBALAMIN) 100 MCG tablet Take 1 tablet (100 mcg total) by mouth daily.  Marland Kitchen zinc oxide (BALMEX) 11.3 % CREA cream Apply 1 application topically 2 (two) times daily.   No facility-administered encounter medications on file as of 09/20/2017.     Review of Systems  Constitutional: Positive for appetite change. Negative for chills, diaphoresis and fever.       Appetite is fair, does not like the food  HENT: Negative for congestion, hearing loss, mouth sores, rhinorrhea, sore throat and trouble swallowing.   Respiratory: Negative for cough and shortness of breath.   Cardiovascular: Negative for chest pain and palpitations.  Gastrointestinal: Positive for constipation. Negative for abdominal pain, diarrhea, nausea and vomiting.  Genitourinary: Negative for dysuria and frequency.  Musculoskeletal: Positive for arthralgias, back pain and gait problem.  Skin: Negative for rash.  Neurological: Negative for dizziness and headaches.  Psychiatric/Behavioral: Negative for confusion, dysphoric mood and sleep disturbance.    Immunization History  Administered Date(s) Administered  . Influenza Split 02/13/2013  . Influenza-Unspecified 02/27/2014, 02/12/2015, 02/25/2016, 03/08/2017  . PPD Test 09/09/2015  . Pneumococcal Conjugate-13 05/05/2016  . Pneumococcal Polysaccharide-23 02/13/2013  . Td 05/05/2016  . Zoster 05/04/2016   Pertinent  Health Maintenance Due  Topic Date Due  . MAMMOGRAM  03/21/1945  . INFLUENZA VACCINE  12/14/2017  . DEXA  SCAN  Completed  . PNA vac Low Risk Adult  Completed   Fall Risk  01/02/2017 11/10/2015 10/20/2015 10/20/2015 06/30/2015  Falls in the past year? No No Yes No No  Number falls in past yr: - - 2 or more - -  Injury with Fall? - - Yes - -  Risk Factor Category  - - - - -  Risk for fall due to : - - History of fall(s);Impaired balance/gait - -  Follow up - - - - -   Functional Status Survey:    Vitals:  09/20/17 1200  BP: 128/70  Pulse: 60  Resp: 18  Temp: (!) 97.5 F (36.4 C)  TempSrc: Oral  SpO2: 97%  Weight: 123 lb 12.8 oz (56.2 kg)  Height: 5\' 8"  (1.727 m)   Body mass index is 18.82 kg/m.   Wt Readings from Last 3 Encounters:  09/20/17 123 lb 12.8 oz (56.2 kg)  08/29/17 126 lb 6.4 oz (57.3 kg)  08/21/17 126 lb 6.4 oz (57.3 kg)   Physical Exam  Constitutional: She is oriented to person, place, and time.  Thin built, frail, elderly female in no acute distress  HENT:  Head: Normocephalic and atraumatic.  Right Ear: External ear normal.  Left Ear: External ear normal.  Nose: Nose normal.  Mouth/Throat: Oropharynx is clear and moist. No oropharyngeal exudate.  Eyes: Pupils are equal, round, and reactive to light. Conjunctivae and EOM are normal. Right eye exhibits no discharge. Left eye exhibits no discharge.  Neck: Normal range of motion. Neck supple.  Cardiovascular: Normal rate and regular rhythm.  Pulmonary/Chest: Effort normal and breath sounds normal. She has no wheezes. She has no rales.  Abdominal: Soft. Bowel sounds are normal. There is no tenderness.  Musculoskeletal: Normal range of motion.  Able to move all 4 extremities, unsteady gait, ROM to both knees limited with pain  Lymphadenopathy:    She has no cervical adenopathy.  Neurological: She is alert and oriented to person, place, and time.  Skin: Skin is warm and dry. She is not diaphoretic.  Blanchable erythema to buttock area with some purplish discoloration to buttock skin as well. 2 opening to gluteal  fold 0.3 x 0.3 cm and antoher 1.3 x 0.3 cm. Some serous drainage present. No periwound erythema or tenderness noted.   Psychiatric: She has a normal mood and affect.    Labs reviewed: Recent Labs    03/30/17 0604 03/30/17 1016 03/31/17 0557 04/03/17  06/27/17 1135  07/13/17 08/10/17 08/31/17 09/07/17  NA 127*  --  127* 127   < > 128*   < > 128*  128 132 127 126  K 4.0  --  4.1 4.0   < > 4.2   < > 3.9  3.9 4.2 4.1 4.1  CL 97*  --  97* 96  --  97*  --  97  --   --   --   CO2 19*  --  24  --   --  23  --  24  --   --   --   GLUCOSE 114*  --  96  --   --  114*  --   --   --   --   --   BUN 21*  --  21* 24*   < > 23*   < > 21  21 25* 24* 21  CREATININE 1.03*  --  1.15* 1.12   < > 1.06*   < > 1.0  0.98 1.03 1.05 1.06  CALCIUM 8.7*  --  8.4* 8.4  --  8.6*  --  8.2  8.2 8.6 8.7 9.0  MG  --  2.1  --   --   --   --   --   --   --   --   --   PHOS  --  2.6  --   --   --   --   --   --   --   --   --    < > = values in  this interval not displayed.   Recent Labs    03/29/17 03/31/17 0557 07/03/17 08/31/17  AST 15 19 26 20   ALT 11 13* 17 1.57  ALKPHOS 47 47 45 53  BILITOT 0.8 0.9  --  0.8  PROT 6.0 6.2*  --  5.5  ALBUMIN 3.6 3.3*  --  3.3   Recent Labs    03/30/17 0604 03/31/17 0557 06/27/17 1135 07/03/17 08/11/17 08/31/17  WBC 5.2 4.5 5.2 6.2 4.8 7  HGB 11.8* 11.0* 12.8 12.0 12.1 11.4  HCT 33.2* 32.4* 36.9 34* 34.5 32.1  MCV 95.1 97.3 97.9  --   --   --   PLT 143* 143* 172 188  --   --    Lab Results  Component Value Date   TSH 1.57 08/31/2017   No results found for: HGBA1C Lab Results  Component Value Date   CHOL 175 11/10/2014   HDL 53 11/10/2014   LDLCALC 87 11/10/2014   TRIG 173 (A) 11/10/2014    Significant Diagnostic Results in last 30 days:  No results found.  Assessment/Plan  1. Benign hypertension with CKD (chronic kidney disease) stage III (HCC) Continue irbesartan, hydralazine and atenolol current dosing. Reviewed bmp  2. Non-seasonal allergic  rhinitis, unspecified trigger Continue loratadine  3. GERD without esophagitis Decrease ranitidine from 75 mg bid to 75 mg daily at bedtime and monitor  4. Chronic constipation Continue miralax and senna, maintain hydration  5. Hypothyroidism, unspecified type Continue levothyroxine, stable TSH  6. Senile osteoporosis D/c prolia and calcium/ vit d supplement. Last T score -1.0 in 02/2017  7. Insomnia, unspecified type Change melatonin to daily as needed for 2 weeks and stop  8. Chronic pain syndrome Change administration time of percocet to 6 am, 12 pm, 6 pm and 11 pm per pt request, rest unchanged  9. Chronic hyponatremia No improvement with salt tablet, d/c salt tablet. Check bmp in 2 weeks  10. B12 deficiency Stable b12, d/c b12 supplement  11. Right ear impacted cerumen Debrox ear drop to right ear bid x 5 days f/b ear lavage  12. Pressure ulcer of contiguous region involving back and right buttock, stage 3 ([place]) Continues to lose weight. With protein calorie malnutrition and her being on her bottom for most part of the day, add low air loss mattress and gel cushion. Clean area with normal saline, pat dry, apply calcium alginate dressing, skin prep the periwound area and then apply foam dressing, change it q3 days and prn. Monitor for worsening wound and signs of infection  13. Pressure ulcer of contiguous region involving back and left buttock, stage 3 ([place]) See above.     Family/ staff Communication: reviewed care plan with patient, her son and charge nurse.    Labs/tests ordered:  Bmp in 2 week   Bailey Serve, MD Internal Medicine [facility] [place], [place] [address] Cell Phone (Monday-Friday 8 am - 5 pm): [phone] On Call: [phone] and follow prompts after 5 pm and on weekends Office Phone: [phone] Office Fax: [phone]

## 2017-09-21 DIAGNOSIS — S329XXA Fracture of unspecified parts of lumbosacral spine and pelvis, initial encounter for closed fracture: Secondary | ICD-10-CM | POA: Diagnosis not present

## 2017-09-21 DIAGNOSIS — R42 Dizziness and giddiness: Secondary | ICD-10-CM | POA: Diagnosis not present

## 2017-09-21 DIAGNOSIS — R2681 Unsteadiness on feet: Secondary | ICD-10-CM | POA: Diagnosis not present

## 2017-09-21 DIAGNOSIS — M6281 Muscle weakness (generalized): Secondary | ICD-10-CM | POA: Diagnosis not present

## 2017-09-21 DIAGNOSIS — S72141A Displaced intertrochanteric fracture of right femur, initial encounter for closed fracture: Secondary | ICD-10-CM | POA: Diagnosis not present

## 2017-09-21 DIAGNOSIS — N182 Chronic kidney disease, stage 2 (mild): Secondary | ICD-10-CM | POA: Diagnosis not present

## 2017-09-25 DIAGNOSIS — M6281 Muscle weakness (generalized): Secondary | ICD-10-CM | POA: Diagnosis not present

## 2017-09-25 DIAGNOSIS — N182 Chronic kidney disease, stage 2 (mild): Secondary | ICD-10-CM | POA: Diagnosis not present

## 2017-09-25 DIAGNOSIS — S72141A Displaced intertrochanteric fracture of right femur, initial encounter for closed fracture: Secondary | ICD-10-CM | POA: Diagnosis not present

## 2017-09-25 DIAGNOSIS — R42 Dizziness and giddiness: Secondary | ICD-10-CM | POA: Diagnosis not present

## 2017-09-25 DIAGNOSIS — S329XXA Fracture of unspecified parts of lumbosacral spine and pelvis, initial encounter for closed fracture: Secondary | ICD-10-CM | POA: Diagnosis not present

## 2017-09-25 DIAGNOSIS — R2681 Unsteadiness on feet: Secondary | ICD-10-CM | POA: Diagnosis not present

## 2017-09-26 DIAGNOSIS — R2681 Unsteadiness on feet: Secondary | ICD-10-CM | POA: Diagnosis not present

## 2017-09-26 DIAGNOSIS — S72141A Displaced intertrochanteric fracture of right femur, initial encounter for closed fracture: Secondary | ICD-10-CM | POA: Diagnosis not present

## 2017-09-26 DIAGNOSIS — R42 Dizziness and giddiness: Secondary | ICD-10-CM | POA: Diagnosis not present

## 2017-09-26 DIAGNOSIS — M6281 Muscle weakness (generalized): Secondary | ICD-10-CM | POA: Diagnosis not present

## 2017-09-26 DIAGNOSIS — S329XXA Fracture of unspecified parts of lumbosacral spine and pelvis, initial encounter for closed fracture: Secondary | ICD-10-CM | POA: Diagnosis not present

## 2017-09-26 DIAGNOSIS — N182 Chronic kidney disease, stage 2 (mild): Secondary | ICD-10-CM | POA: Diagnosis not present

## 2017-09-27 DIAGNOSIS — S72141A Displaced intertrochanteric fracture of right femur, initial encounter for closed fracture: Secondary | ICD-10-CM | POA: Diagnosis not present

## 2017-09-27 DIAGNOSIS — N182 Chronic kidney disease, stage 2 (mild): Secondary | ICD-10-CM | POA: Diagnosis not present

## 2017-09-27 DIAGNOSIS — R42 Dizziness and giddiness: Secondary | ICD-10-CM | POA: Diagnosis not present

## 2017-09-27 DIAGNOSIS — M6281 Muscle weakness (generalized): Secondary | ICD-10-CM | POA: Diagnosis not present

## 2017-09-27 DIAGNOSIS — S329XXA Fracture of unspecified parts of lumbosacral spine and pelvis, initial encounter for closed fracture: Secondary | ICD-10-CM | POA: Diagnosis not present

## 2017-09-27 DIAGNOSIS — R2681 Unsteadiness on feet: Secondary | ICD-10-CM | POA: Diagnosis not present

## 2017-10-02 DIAGNOSIS — N182 Chronic kidney disease, stage 2 (mild): Secondary | ICD-10-CM | POA: Diagnosis not present

## 2017-10-02 DIAGNOSIS — M6281 Muscle weakness (generalized): Secondary | ICD-10-CM | POA: Diagnosis not present

## 2017-10-02 DIAGNOSIS — S329XXA Fracture of unspecified parts of lumbosacral spine and pelvis, initial encounter for closed fracture: Secondary | ICD-10-CM | POA: Diagnosis not present

## 2017-10-02 DIAGNOSIS — S72141A Displaced intertrochanteric fracture of right femur, initial encounter for closed fracture: Secondary | ICD-10-CM | POA: Diagnosis not present

## 2017-10-02 DIAGNOSIS — R42 Dizziness and giddiness: Secondary | ICD-10-CM | POA: Diagnosis not present

## 2017-10-02 DIAGNOSIS — R2681 Unsteadiness on feet: Secondary | ICD-10-CM | POA: Diagnosis not present

## 2017-10-03 DIAGNOSIS — N182 Chronic kidney disease, stage 2 (mild): Secondary | ICD-10-CM | POA: Diagnosis not present

## 2017-10-03 DIAGNOSIS — M6281 Muscle weakness (generalized): Secondary | ICD-10-CM | POA: Diagnosis not present

## 2017-10-03 DIAGNOSIS — R42 Dizziness and giddiness: Secondary | ICD-10-CM | POA: Diagnosis not present

## 2017-10-03 DIAGNOSIS — R2681 Unsteadiness on feet: Secondary | ICD-10-CM | POA: Diagnosis not present

## 2017-10-03 DIAGNOSIS — S329XXA Fracture of unspecified parts of lumbosacral spine and pelvis, initial encounter for closed fracture: Secondary | ICD-10-CM | POA: Diagnosis not present

## 2017-10-03 DIAGNOSIS — S72141A Displaced intertrochanteric fracture of right femur, initial encounter for closed fracture: Secondary | ICD-10-CM | POA: Diagnosis not present

## 2017-10-04 DIAGNOSIS — S72141A Displaced intertrochanteric fracture of right femur, initial encounter for closed fracture: Secondary | ICD-10-CM | POA: Diagnosis not present

## 2017-10-04 DIAGNOSIS — N182 Chronic kidney disease, stage 2 (mild): Secondary | ICD-10-CM | POA: Diagnosis not present

## 2017-10-04 DIAGNOSIS — R2681 Unsteadiness on feet: Secondary | ICD-10-CM | POA: Diagnosis not present

## 2017-10-04 DIAGNOSIS — S329XXA Fracture of unspecified parts of lumbosacral spine and pelvis, initial encounter for closed fracture: Secondary | ICD-10-CM | POA: Diagnosis not present

## 2017-10-04 DIAGNOSIS — M6281 Muscle weakness (generalized): Secondary | ICD-10-CM | POA: Diagnosis not present

## 2017-10-04 DIAGNOSIS — R42 Dizziness and giddiness: Secondary | ICD-10-CM | POA: Diagnosis not present

## 2017-10-05 DIAGNOSIS — N182 Chronic kidney disease, stage 2 (mild): Secondary | ICD-10-CM | POA: Diagnosis not present

## 2017-10-05 LAB — BASIC METABOLIC PANEL
BUN: 37 — AB (ref 4–21)
CALCIUM: 9.4
CREATININE: 1.01
GFR CALC NON AF AMER: 50
Glucose: 108
POTASSIUM: 4.1
Sodium: 125

## 2017-10-06 ENCOUNTER — Encounter: Payer: Self-pay | Admitting: Family

## 2017-10-06 ENCOUNTER — Non-Acute Institutional Stay: Payer: Medicare Other | Admitting: Family

## 2017-10-06 DIAGNOSIS — E871 Hypo-osmolality and hyponatremia: Secondary | ICD-10-CM

## 2017-10-06 NOTE — Progress Notes (Signed)
Location:  [facility] Room Number: 35 Place of Service:  [phone]) Provider: Satchel Heidinger FNP-C  Blanchie Serve, MD  Patient Care Team: Blanchie Serve, MD as PCP - General (Internal Medicine) Cyrene Gharibian, Nelda Bucks, NP as Nurse Practitioner (Family Medicine)  Extended Emergency Contact Information Primary Emergency Contact: Vint,[name] Address: [address] [place], [place] [address] [place] Mobile Phone: [phone] Relation: Son  Code Status:  DNR Goals of care: Advanced Directive information Advanced Directives 10/06/2017  Does Patient Have a Medical Advance Directive? Yes  Type of Paramedic of [place];Out of facility DNR (pink MOST or yellow form);Living will  Does patient want to make changes to medical advance directive? -  Copy of [place] in Chart? Yes  Would patient like information on creating a medical advance directive? -  Pre-existing out of facility DNR order (yellow form or pink MOST form) Yellow form placed in chart (order not valid for inpatient use);Pink MOST form placed in chart (order not valid for inpatient use)     Chief Complaint  Patient presents with  . Acute Visit    abnormal labs    HPI:  Pt is a 84 y.o. female seen today at [facility] for an acute visit for evaluation of abnormal lab results.she is seen in her room today.Her lab resulted BUN 37,CR1.01,Na 125 (10/05/2017). Her previous Na+ 126 Sodium chloride 1 gm tablet recent discontinued by MD. She states has occasional headache,tiredness but denies any nausea,vomiting or muscle cramps.Facility Nurse reports no new concerns of confusion.   Past Medical History:  Diagnosis Date  . Acquired cyst of kidney    left  . Allergic rhinitis, cause unspecified   . Anemia, unspecified   . Chronic hyponatremia 09/05/2015  . CKD stage 2 due to type 2 diabetes mellitus ([place]) 11/18/2014  . Closed fracture  of unspecified part of vertebral column without mention of spinal cord injury    T7 s/p kyphoplasty, T12  . Edema 2015  . Herpes simplex without mention of complication   . History of Epstein-Barr virus infection   . Hyposmolality and/or hyponatremia    07/13/15 Na 131  . Insomnia   . Insomnia, unspecified   . Left cavernous carotid aneurysm 08/2008   1-1/48mm aneurysm of cavernous carotid artery  . Malignant neoplasm of breast (female), unspecified site 1990   left. Had surgerey and chemo, but no radiation  . 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 Neuropathy   . Osteoarthrosis of knee    bilaterally  . Osteoarthrosis, hip   . Osteoarthrosis, unspecified whether generalized or localized, forearm    bilaterally wrist  . Other and unspecified nonspecific immunological findings    positive ANA  . Other B-complex deficiencies   . Pain in joint, site unspecified    severe, diffuse, chronic pain  . Positive ANA (antinuclear antibody)   . Pulmonary embolism ([place]) 05/07/2015  . Pure hypercholesterolemia   . Senile osteoporosis   . Unspecified essential hypertension   . Unspecified gastritis and gastroduodenitis without mention of hemorrhage   . Unspecified hypothyroidism   . Unspecified vitamin D deficiency    Past Surgical History:  Procedure Laterality Date  . BREAST SURGERY Left   . DILATATION & CURETTAGE/HYSTEROSCOPY WITH MYOSURE N/A 06/27/2017   Procedure: [place];  Surgeon: Servando Salina, MD;  Location: [facility];  Service: Gynecology;  Laterality: N/A;  . DILATION AND CURETTAGE OF UTERUS  X 2  . FEMUR IM NAIL Right 03/18/2015   Procedure: INTRAMEDULLARY (IM) NAIL FEMORAL;  Surgeon: Rod Can, MD;  Location: [facility];  Service: Orthopedics;  Laterality: Right;  . FRACTURE SURGERY     hip  . KYPHOPLASTY  07/03/2010   T12  . KYPHOPLASTY     C7  . LAPAROSCOPIC CHOLECYSTECTOMY  09/20/2006    . MASTECTOMY Left 04/29/1989  . MIDDLE EAR SURGERY Bilateral 1938  . NASAL SINUS SURGERY  1990 & 2000  . ORIF HIP FRACTURE Left 06/13/2007   following a syncopal episode  . TONSILLECTOMY  1968    Allergies  Allergen Reactions  . Aspirin     Drop in body temp with large quantities, can tolerate low doses of aspirin  . Penicillins Swelling    Has patient had a PCN reaction causing immediate rash, facial/tongue/throat swelling, SOB or lightheadedness with hypotension: No Has patient had a PCN reaction causing severe rash involving mucus membranes or skin necrosis: No Has patient had a PCN reaction that required hospitalization No Has patient had a PCN reaction occurring within the last 10 years: No If all of the above answers are "NO", then may proceed with Cephalosporin use.    Outpatient Encounter Medications as of 10/06/2017  Medication Sig  . acetaminophen (TYLENOL) 325 MG tablet Take 650 mg by mouth every 4 (four) hours as needed for fever.  Marland Kitchen acetaminophen (TYLENOL) 500 MG tablet Take 1,000 mg by mouth at bedtime.   Marland Kitchen acetaminophen (TYLENOL) 500 MG tablet Take 500 mg by mouth daily. One tablet in am and 2 tablet in pm  . Amino Acids-Protein Hydrolys (FEEDING SUPPLEMENT, PRO-STAT SUGAR FREE 64,) LIQD Take 30 mLs by mouth 2 (two) times daily.   Marland Kitchen atenolol (TENORMIN) 50 MG tablet Take 50 mg by mouth at bedtime. Hold for SBP <110 or HR <60   . feeding supplement (BOOST / RESOURCE BREEZE) LIQD Take 1 Container by mouth 2 (two) times daily between meals. (1000 & 1600) Prefers Chocolate  . hydrALAZINE (APRESOLINE) 50 MG tablet Take 100 mg by mouth as directed. 100mg  twice daily at 6AM and 10PM; 50 mg daily at [place] for SBP <110  . irbesartan (AVAPRO) 300 MG tablet Take 300 mg by mouth daily. (0800) Hold for BP <90/60  . levothyroxine (SYNTHROID, LEVOTHROID) 150 MCG tablet Take 150 mcg by mouth daily before breakfast. (0730)  . Lidocaine (ASPERCREME LIDOCAINE) 4 % PTCH Apply 3 patches  topically once. Apply 1 patch to the left hip, left knee, and the right shoulder in the morning. Remove in the evening.  . loratadine (CLARITIN) 10 MG tablet Take 1 tablet (10 mg total) by mouth daily.  . Menthol, Topical Analgesic, (BIOFREEZE) 4 % GEL Apply 1 application topically 3 (three) times daily as needed.  . Multiple Vitamins-Minerals (DECUBI-VITE PO) Take 1 tablet by mouth daily.  Marland Kitchen nystatin-triamcinolone (MYCOLOG II) cream Apply 1 application topically 2 (two) times daily as needed.   Marland Kitchen oxyCODONE-acetaminophen (PERCOCET) 10-325 MG tablet Take 1 tablet by mouth as directed. Take one tablet four times daily and one tablet at 12AM if needed  . polyethylene glycol (MIRALAX / GLYCOLAX) packet Take 17 g by mouth daily.  . psyllium (METAMUCIL) 58.6 % powder Take 1 packet by mouth daily. Mix in 6 oz of water or juice  . ranitidine (ZANTAC) 75 MG tablet Take 75 mg by mouth at  bedtime.   . senna-docusate (SENOKOT S) 8.6-50 MG tablet 1 tablet nightly to prevent constipation  . UNABLE TO FIND Med Name: Hydrocolloid dressing 4x4. Change dressing to the coccyx as needed. Check patency daily  . zinc oxide (BALMEX) 11.3 % CREA cream Apply 1 application topically 2 (two) times daily.  . [DISCONTINUED] calcium citrate (CALCITRATE) 950 MG tablet Take 1 tablet (200 mg of elemental calcium total) by mouth daily.  . [DISCONTINUED] Melatonin 3 MG TABS Take 1 tablet (3 mg total) by mouth at bedtime. (Patient not taking: Reported on 10/06/2017)  . [DISCONTINUED] vitamin B-12 (CYANOCOBALAMIN) 100 MCG tablet Take 1 tablet (100 mcg total) by mouth daily.   No facility-administered encounter medications on file as of 10/06/2017.     Review of Systems  Constitutional: Positive for fatigue. Negative for appetite change, chills and fever.  HENT: Negative for congestion, rhinorrhea, sinus pressure, sinus pain, sneezing and sore throat.   Eyes: Negative for discharge and redness.  Respiratory: Negative for cough,  chest tightness, shortness of breath and wheezing.   Cardiovascular: Positive for leg swelling. Negative for chest pain and palpitations.  Gastrointestinal: Negative for abdominal distention, abdominal pain, constipation, diarrhea, nausea and vomiting.  Endocrine: Negative for cold intolerance, heat intolerance, polydipsia, polyphagia and polyuria.  Genitourinary: Negative for dysuria, frequency and urgency.  Musculoskeletal: Positive for arthralgias and gait problem.  Skin: Negative for color change, pallor and rash.  Neurological: Negative for dizziness, syncope, weakness and light-headedness.       Occasional headache   Psychiatric/Behavioral: Negative for agitation, confusion and sleep disturbance. The patient is not nervous/anxious.     Immunization History  Administered Date(s) Administered  . Influenza Split 02/13/2013  . Influenza-Unspecified 02/27/2014, 02/12/2015, 02/25/2016, 03/08/2017  . PPD Test 09/09/2015  . Pneumococcal Conjugate-13 05/05/2016  . Pneumococcal Polysaccharide-23 02/13/2013  . Td 05/05/2016  . Zoster 05/04/2016   Pertinent  Health Maintenance Due  Topic Date Due  . MAMMOGRAM  03/21/1945  . INFLUENZA VACCINE  12/14/2017  . DEXA SCAN  Completed  . PNA vac Low Risk Adult  Completed   Fall Risk  01/02/2017 11/10/2015 10/20/2015 10/20/2015 06/30/2015  Falls in the past year? No No Yes No No  Number falls in past yr: - - 2 or more - -  Injury with Fall? - - Yes - -  Risk Factor Category  - - - - -  Risk for fall due to : - - History of fall(s);Impaired balance/gait - -  Follow up - - - - -    Vitals:   10/06/17 0944  BP: (!) 160/74  Pulse: 65  Resp: 18  Temp: 98.1 F (36.7 C)  SpO2: 98%  Weight: 124 lb 9.6 oz (56.5 kg)  Height: 5\' 8"  (1.727 m)   Body mass index is 18.95 kg/m. Physical Exam  Constitutional: She is oriented to person, place, and time.  Frail elderly in no acute distress   HENT:  Head: Normocephalic.  Mouth/Throat: Oropharynx is  clear and moist. No oropharyngeal exudate.  Eyes: Pupils are equal, round, and reactive to light. Conjunctivae and EOM are normal. Right eye exhibits no discharge. Left eye exhibits no discharge. No scleral icterus.  Eye glasses in place   Neck: Normal range of motion. No JVD present. No thyromegaly present.  Cardiovascular: Normal rate, regular rhythm, normal heart sounds and intact distal pulses. Exam reveals no gallop and no friction rub.  No murmur heard. Pulmonary/Chest: Effort normal and breath sounds normal. No respiratory distress. She  has no wheezes. She has no rales.  Abdominal: Soft. Bowel sounds are normal. She exhibits no distension and no mass. There is no tenderness. There is no rebound and no guarding.  Musculoskeletal: She exhibits no tenderness.  Moves x 4 extremities except ROM limited to knees due to pain.unsteady gait ambulates with Rolator.bilateral lower extremities trace edema   Lymphadenopathy:    She has no cervical adenopathy.  Neurological: She is oriented to person, place, and time. Gait normal.  Skin: Skin is warm and dry. No rash noted. No erythema. No pallor.  Psychiatric: She has a normal mood and affect. Her behavior is normal.  Nursing note and vitals reviewed.  Labs reviewed: Recent Labs    03/30/17 0604 03/30/17 1016 03/31/17 0557 04/03/17  06/27/17 1135  07/13/17  08/31/17 09/07/17 10/05/17  NA 127*  --  127* 127   < > 128*   < > 128*  128   < > 127 126 125  K 4.0  --  4.1 4.0   < > 4.2   < > 3.9  3.9   < > 4.1 4.1 4.1  CL 97*  --  97* 96  --  97*  --  97  --   --   --   --   CO2 19*  --  24  --   --  23  --  24  --   --   --   --   GLUCOSE 114*  --  96  --   --  114*  --   --   --   --   --   --   BUN 21*  --  21* 24*   < > 23*   < > 21  21   < > 24* 21 37*  CREATININE 1.03*  --  1.15* 1.12   < > 1.06*   < > 1.0  0.98   < > 1.05 1.06 1.01  CALCIUM 8.7*  --  8.4* 8.4  --  8.6*  --  8.2  8.2   < > 8.7 9.0 9.4  MG  --  2.1  --   --   --   --    --   --   --   --   --   --   PHOS  --  2.6  --   --   --   --   --   --   --   --   --   --    < > = values in this interval not displayed.   Recent Labs    03/29/17 03/31/17 0557 07/03/17 08/31/17  AST 15 19 26 20   ALT 11 13* 17 1.57  ALKPHOS 47 47 45 53  BILITOT 0.8 0.9  --  0.8  PROT 6.0 6.2*  --  5.5  ALBUMIN 3.6 3.3*  --  3.3   Recent Labs    03/30/17 0604 03/31/17 0557 06/27/17 1135 07/03/17 08/11/17 08/31/17  WBC 5.2 4.5 5.2 6.2 4.8 7  HGB 11.8* 11.0* 12.8 12.0 12.1 11.4  HCT 33.2* 32.4* 36.9 34* 34.5 32.1  MCV 95.1 97.3 97.9  --   --   --   PLT 143* 143* 172 188  --   --    Lab Results  Component Value Date   TSH 1.57 08/31/2017   No results found for: HGBA1C Lab Results  Component Value Date   CHOL 175 11/10/2014   HDL  53 11/10/2014   LDLCALC 87 11/10/2014   TRIG 173 (A) 11/10/2014    Significant Diagnostic Results in last 30 days:  No results found.  Assessment/Plan  Chronic hyponatremia occasional headache and fatigue reported but overall asymptomatic.Na+ 125( 10/06/2017) previous 126.Will restart Sodium chloride 1 gram tablet daily.recheck BMP in 2 weeks.continue to monitor.   Family/ staff Communication: Reviewed plan of care with patient and facility Nurse.   Labs/tests ordered: BMP in 2 weeks  Sandrea Hughs, NP

## 2017-10-09 DIAGNOSIS — N182 Chronic kidney disease, stage 2 (mild): Secondary | ICD-10-CM | POA: Diagnosis not present

## 2017-10-09 DIAGNOSIS — S329XXA Fracture of unspecified parts of lumbosacral spine and pelvis, initial encounter for closed fracture: Secondary | ICD-10-CM | POA: Diagnosis not present

## 2017-10-09 DIAGNOSIS — R2681 Unsteadiness on feet: Secondary | ICD-10-CM | POA: Diagnosis not present

## 2017-10-09 DIAGNOSIS — M6281 Muscle weakness (generalized): Secondary | ICD-10-CM | POA: Diagnosis not present

## 2017-10-09 DIAGNOSIS — S72141A Displaced intertrochanteric fracture of right femur, initial encounter for closed fracture: Secondary | ICD-10-CM | POA: Diagnosis not present

## 2017-10-09 DIAGNOSIS — R42 Dizziness and giddiness: Secondary | ICD-10-CM | POA: Diagnosis not present

## 2017-10-12 ENCOUNTER — Encounter: Payer: Self-pay | Admitting: Internal Medicine

## 2017-10-12 DIAGNOSIS — R42 Dizziness and giddiness: Secondary | ICD-10-CM | POA: Diagnosis not present

## 2017-10-12 DIAGNOSIS — S329XXA Fracture of unspecified parts of lumbosacral spine and pelvis, initial encounter for closed fracture: Secondary | ICD-10-CM | POA: Diagnosis not present

## 2017-10-12 DIAGNOSIS — M6281 Muscle weakness (generalized): Secondary | ICD-10-CM | POA: Diagnosis not present

## 2017-10-12 DIAGNOSIS — S72141A Displaced intertrochanteric fracture of right femur, initial encounter for closed fracture: Secondary | ICD-10-CM | POA: Diagnosis not present

## 2017-10-12 DIAGNOSIS — R2681 Unsteadiness on feet: Secondary | ICD-10-CM | POA: Diagnosis not present

## 2017-10-12 DIAGNOSIS — N182 Chronic kidney disease, stage 2 (mild): Secondary | ICD-10-CM | POA: Diagnosis not present

## 2017-10-12 NOTE — Telephone Encounter (Signed)
Please see Mychart message from patient's son and advise. Thanks

## 2017-10-16 DIAGNOSIS — R609 Edema, unspecified: Secondary | ICD-10-CM | POA: Diagnosis not present

## 2017-10-16 DIAGNOSIS — S72141A Displaced intertrochanteric fracture of right femur, initial encounter for closed fracture: Secondary | ICD-10-CM | POA: Diagnosis not present

## 2017-10-16 DIAGNOSIS — W19XXXA Unspecified fall, initial encounter: Secondary | ICD-10-CM | POA: Diagnosis not present

## 2017-10-16 DIAGNOSIS — D649 Anemia, unspecified: Secondary | ICD-10-CM | POA: Diagnosis not present

## 2017-10-16 DIAGNOSIS — E039 Hypothyroidism, unspecified: Secondary | ICD-10-CM | POA: Diagnosis not present

## 2017-10-16 DIAGNOSIS — M1991 Primary osteoarthritis, unspecified site: Secondary | ICD-10-CM | POA: Diagnosis not present

## 2017-10-16 DIAGNOSIS — I10 Essential (primary) hypertension: Secondary | ICD-10-CM | POA: Diagnosis not present

## 2017-10-16 DIAGNOSIS — Z4789 Encounter for other orthopedic aftercare: Secondary | ICD-10-CM | POA: Diagnosis not present

## 2017-10-16 DIAGNOSIS — E7800 Pure hypercholesterolemia, unspecified: Secondary | ICD-10-CM | POA: Diagnosis not present

## 2017-10-16 DIAGNOSIS — C50919 Malignant neoplasm of unspecified site of unspecified female breast: Secondary | ICD-10-CM | POA: Diagnosis not present

## 2017-10-16 DIAGNOSIS — M810 Age-related osteoporosis without current pathological fracture: Secondary | ICD-10-CM | POA: Diagnosis not present

## 2017-10-16 DIAGNOSIS — M1610 Unilateral primary osteoarthritis, unspecified hip: Secondary | ICD-10-CM | POA: Diagnosis not present

## 2017-10-16 DIAGNOSIS — S329XXA Fracture of unspecified parts of lumbosacral spine and pelvis, initial encounter for closed fracture: Secondary | ICD-10-CM | POA: Diagnosis not present

## 2017-10-16 DIAGNOSIS — S72001A Fracture of unspecified part of neck of right femur, initial encounter for closed fracture: Secondary | ICD-10-CM | POA: Diagnosis not present

## 2017-10-16 DIAGNOSIS — R1311 Dysphagia, oral phase: Secondary | ICD-10-CM | POA: Diagnosis not present

## 2017-10-16 DIAGNOSIS — G4700 Insomnia, unspecified: Secondary | ICD-10-CM | POA: Diagnosis not present

## 2017-10-16 DIAGNOSIS — R42 Dizziness and giddiness: Secondary | ICD-10-CM | POA: Diagnosis not present

## 2017-10-16 DIAGNOSIS — R2681 Unsteadiness on feet: Secondary | ICD-10-CM | POA: Diagnosis not present

## 2017-10-16 DIAGNOSIS — M6281 Muscle weakness (generalized): Secondary | ICD-10-CM | POA: Diagnosis not present

## 2017-10-16 DIAGNOSIS — D500 Iron deficiency anemia secondary to blood loss (chronic): Secondary | ICD-10-CM | POA: Diagnosis not present

## 2017-10-17 DIAGNOSIS — S72141A Displaced intertrochanteric fracture of right femur, initial encounter for closed fracture: Secondary | ICD-10-CM | POA: Diagnosis not present

## 2017-10-17 DIAGNOSIS — S329XXA Fracture of unspecified parts of lumbosacral spine and pelvis, initial encounter for closed fracture: Secondary | ICD-10-CM | POA: Diagnosis not present

## 2017-10-17 DIAGNOSIS — R1311 Dysphagia, oral phase: Secondary | ICD-10-CM | POA: Diagnosis not present

## 2017-10-17 DIAGNOSIS — M6281 Muscle weakness (generalized): Secondary | ICD-10-CM | POA: Diagnosis not present

## 2017-10-17 DIAGNOSIS — R2681 Unsteadiness on feet: Secondary | ICD-10-CM | POA: Diagnosis not present

## 2017-10-17 DIAGNOSIS — R42 Dizziness and giddiness: Secondary | ICD-10-CM | POA: Diagnosis not present

## 2017-10-19 ENCOUNTER — Encounter: Payer: Self-pay | Admitting: Family

## 2017-10-19 ENCOUNTER — Non-Acute Institutional Stay: Payer: Medicare Other | Admitting: Family

## 2017-10-19 DIAGNOSIS — I129 Hypertensive chronic kidney disease with stage 1 through stage 4 chronic kidney disease, or unspecified chronic kidney disease: Secondary | ICD-10-CM

## 2017-10-19 DIAGNOSIS — I10 Essential (primary) hypertension: Secondary | ICD-10-CM | POA: Diagnosis not present

## 2017-10-19 DIAGNOSIS — N183 Chronic kidney disease, stage 3 (moderate): Secondary | ICD-10-CM

## 2017-10-19 DIAGNOSIS — R630 Anorexia: Secondary | ICD-10-CM

## 2017-10-19 DIAGNOSIS — R112 Nausea with vomiting, unspecified: Secondary | ICD-10-CM | POA: Diagnosis not present

## 2017-10-19 LAB — BASIC METABOLIC PANEL
BUN: 39 — AB (ref 4–21)
CALCIUM: 9.1
Creat: 1.01
EGFR (Non-African Amer.): 50
GLUCOSE: 122
POTASSIUM: 3.9
SODIUM: 128

## 2017-10-19 NOTE — Progress Notes (Addendum)
Location:  [facility] Room Number: 35 Place of Service:  ALF [phone]) Provider: Chalon Zobrist FNP-C  Blanchie Serve, MD  Patient Care Team: Blanchie Serve, MD as PCP - General (Internal Medicine) Julen Rubert, Nelda Bucks, NP as Nurse Practitioner (Family Medicine)  Extended Emergency Contact Information Primary Emergency Contact: Hone,[name] Address: [address] [place], [place] [address] [place] Mobile Phone: [phone] Relation: Son  Code Status:  DNR Goals of care: Advanced Directive information Advanced Directives 10/19/2017  Does Patient Have a Medical Advance Directive? Yes  Type of Paramedic of [place];Out of facility DNR (pink MOST or yellow form);Living will  Does patient want to make changes to medical advance directive? -  Copy of [place] in Chart? Yes  Would patient like information on creating a medical advance directive? -  Pre-existing out of facility DNR order (yellow form or pink MOST form) Yellow form placed in chart (order not valid for inpatient use);Pink MOST form placed in chart (order not valid for inpatient use)     Chief Complaint  Patient presents with  . Acute Visit    nausea and vomiting    HPI:  Pt is a 84 y.o. female seen today at [facility] for an acute visit for evaluation of nausea and vomiting.she is seen in her room today with facility charge nurse present at bedside.Nurse reports patient had an episode of nausea and vomiting greenish yellow yesterday afternoon.Phergan suppository given.Patient reported relief.she had another episode of nausea and vomiting this morning described as thick phlegm.Also reports nausea whenever she looks at the food.she prefers cream soup and her protein supplements.she states has no appetite.she states had a slight headache this morning.she denies any abdominal pain,bloating,distension,heart burn,diarrhea or  constipation.Also denies any fever though states stays cold most of the time.   Past Medical History:  Diagnosis Date  . Acquired cyst of kidney    left  . Allergic rhinitis, cause unspecified   . Anemia, unspecified   . Chronic hyponatremia 09/05/2015  . CKD stage 2 due to type 2 diabetes mellitus ([place]) 11/18/2014  . Closed fracture of unspecified part of vertebral column without mention of spinal cord injury    T7 s/p kyphoplasty, T12  . Edema 2015  . Herpes simplex without mention of complication   . History of Epstein-Barr virus infection   . Hyposmolality and/or hyponatremia    07/13/15 Na 131  . Insomnia   . Insomnia, unspecified   . Left cavernous carotid aneurysm 08/2008   1-1/13mm aneurysm of cavernous carotid artery  . Malignant neoplasm of breast (female), unspecified site 1990   left. Had surgerey and chemo, but no radiation  . 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 Neuropathy   . Osteoarthrosis of knee    bilaterally  . Osteoarthrosis, hip   . Osteoarthrosis, unspecified whether generalized or localized, forearm    bilaterally wrist  . Other and unspecified nonspecific immunological findings    positive ANA  . Other B-complex deficiencies   . Pain in joint, site unspecified    severe, diffuse, chronic pain  . Positive ANA (antinuclear antibody)   . Pulmonary embolism ([place]) 05/07/2015  . Pure hypercholesterolemia   . Senile osteoporosis   . Unspecified essential hypertension   . Unspecified gastritis and gastroduodenitis without mention of hemorrhage   . Unspecified hypothyroidism   .  Unspecified vitamin D deficiency    Past Surgical History:  Procedure Laterality Date  . BREAST SURGERY Left   . DILATATION & CURETTAGE/HYSTEROSCOPY WITH MYOSURE N/A 06/27/2017   Procedure: DILATATION & CURETTAGE/HYSTEROSCOPY WITH MYOSURE;  Surgeon: Servando Salina, MD;  Location: [facility];  Service: Gynecology;  Laterality: N/A;  .  DILATION AND CURETTAGE OF UTERUS  X 2  . FEMUR IM NAIL Right 03/18/2015   Procedure: INTRAMEDULLARY (IM) NAIL FEMORAL;  Surgeon: Rod Can, MD;  Location: [facility];  Service: Orthopedics;  Laterality: Right;  . FRACTURE SURGERY     hip  . KYPHOPLASTY  07/03/2010   T12  . KYPHOPLASTY     C7  . LAPAROSCOPIC CHOLECYSTECTOMY  09/20/2006  . MASTECTOMY Left 04/29/1989  . MIDDLE EAR SURGERY Bilateral 1938  . NASAL SINUS SURGERY  1990 & 2000  . ORIF HIP FRACTURE Left 06/13/2007   following a syncopal episode  . TONSILLECTOMY  1968    Allergies  Allergen Reactions  . Aspirin     Drop in body temp with large quantities, can tolerate low doses of aspirin  . Penicillins Swelling    Has patient had a PCN reaction causing immediate rash, facial/tongue/throat swelling, SOB or lightheadedness with hypotension: No Has patient had a PCN reaction causing severe rash involving mucus membranes or skin necrosis: No Has patient had a PCN reaction that required hospitalization No Has patient had a PCN reaction occurring within the last 10 years: No If all of the above answers are "NO", then may proceed with Cephalosporin use.    Outpatient Encounter Medications as of 10/19/2017  Medication Sig  . acetaminophen (TYLENOL) 325 MG tablet Take 650 mg by mouth every 4 (four) hours as needed for fever.  Marland Kitchen acetaminophen (TYLENOL) 500 MG tablet Take 1,000 mg by mouth at bedtime.   Marland Kitchen acetaminophen (TYLENOL) 500 MG tablet Take 500 mg by mouth daily. One tablet in am and 2 tablet in pm  . Amino Acids-Protein Hydrolys (FEEDING SUPPLEMENT, PRO-STAT SUGAR FREE 64,) LIQD Take 30 mLs by mouth 2 (two) times daily.   Marland Kitchen atenolol (TENORMIN) 50 MG tablet Take 50 mg by mouth at bedtime. Hold for SBP <110 or HR <60   . feeding supplement (BOOST / RESOURCE BREEZE) LIQD Take 1 Container by mouth 2 (two) times daily between meals. (1000 & 1600) Prefers Chocolate  . hydrALAZINE (APRESOLINE) 50 MG tablet Take 100 mg by mouth as  directed. 100mg  twice daily at 6AM and 10PM; 50 mg daily at [place] for SBP <110  . irbesartan (AVAPRO) 300 MG tablet Take 300 mg by mouth daily. (0800) Hold for BP <90/60  . levothyroxine (SYNTHROID, LEVOTHROID) 150 MCG tablet Take 150 mcg by mouth daily before breakfast. (0730)  . Lidocaine (ASPERCREME LIDOCAINE) 4 % PTCH Apply 3 patches topically once. Apply 1 patch to the left hip, left knee, and the right shoulder in the morning. Remove in the evening.  . loratadine (CLARITIN) 10 MG tablet Take 1 tablet (10 mg total) by mouth daily.  . Menthol, Topical Analgesic, (BIOFREEZE) 4 % GEL Apply 1 application topically 3 (three) times daily as needed.  . Multiple Vitamins-Minerals (DECUBI-VITE PO) Take 1 tablet by mouth daily.  Marland Kitchen nystatin-triamcinolone (MYCOLOG II) cream Apply 1 application topically 2 (two) times daily as needed.   Marland Kitchen oxyCODONE-acetaminophen (PERCOCET) 10-325 MG tablet Take 1 tablet by mouth as directed. Take one tablet four times daily and one tablet at 12AM if needed  . polyethylene glycol (MIRALAX /  GLYCOLAX) packet Take 17 g by mouth daily.  . psyllium (METAMUCIL) 58.6 % powder Take 1 packet by mouth daily. Mix in 6 oz of water or juice  . ranitidine (ZANTAC) 75 MG tablet Take 75 mg by mouth at bedtime.   . senna-docusate (SENOKOT S) 8.6-50 MG tablet 1 tablet nightly to prevent constipation  . sodium chloride 1 g tablet Take 1 g by mouth daily.  Marland Kitchen UNABLE TO FIND Med Name: Hydrocolloid dressing 4x4. Change dressing to the coccyx as needed. Check patency daily  . zinc oxide (BALMEX) 11.3 % CREA cream Apply 1 application topically 2 (two) times daily.   No facility-administered encounter medications on file as of 10/19/2017.     Review of Systems  Reason unable to perform ROS: additional information provided by facility charge nurse.  Constitutional: Positive for appetite change. Negative for chills, fatigue and fever.  Respiratory: Negative for cough, chest tightness,  shortness of breath and wheezing.   Cardiovascular: Negative for chest pain, palpitations and leg swelling.  Gastrointestinal: Positive for nausea and vomiting. Negative for abdominal distention, abdominal pain, constipation and diarrhea.  Genitourinary: Negative for dysuria, flank pain, frequency and urgency.  Musculoskeletal: Positive for arthralgias and gait problem. Negative for myalgias.  Skin: Negative for color change, pallor and rash.  Neurological: Negative for dizziness, tremors, seizures, weakness, light-headedness and headaches.       Had headache earlier but none during visit     Immunization History  Administered Date(s) Administered  . Influenza Split 02/13/2013  . Influenza-Unspecified 02/27/2014, 02/12/2015, 02/25/2016, 03/08/2017  . PPD Test 09/09/2015  . Pneumococcal Conjugate-13 05/05/2016  . Pneumococcal Polysaccharide-23 02/13/2013  . Td 05/05/2016  . Zoster 05/04/2016   Pertinent  Health Maintenance Due  Topic Date Due  . MAMMOGRAM  03/21/1945  . INFLUENZA VACCINE  12/14/2017  . DEXA SCAN  Completed  . PNA vac Low Risk Adult  Completed   Fall Risk  01/02/2017 11/10/2015 10/20/2015 10/20/2015 06/30/2015  Falls in the past year? No No Yes No No  Number falls in past yr: - - 2 or more - -  Injury with Fall? - - Yes - -  Risk Factor Category  - - - - -  Risk for fall due to : - - History of fall(s);Impaired balance/gait - -  Follow up - - - - -     Vitals:   10/19/17 0957  BP: 132/82  Pulse: 87  Resp: 18  Temp: 98.2 F (36.8 C)  SpO2: 98%  Weight: 124 lb 9.6 oz (56.5 kg)  Height: 5\' 8"  (1.727 m)   Body mass index is 18.95 kg/m. Physical Exam  Constitutional: She is oriented to person, place, and time.  Frail elderly in no acute distress   HENT:  Head: Normocephalic.  Mouth/Throat: Oropharynx is clear and moist. No oropharyngeal exudate.  Eyes: Pupils are equal, round, and reactive to light. Conjunctivae are normal. Right eye exhibits no discharge.  Left eye exhibits no discharge. No scleral icterus.  Neck: Normal range of motion.  Cardiovascular: Normal rate, regular rhythm, normal heart sounds and intact distal pulses. Exam reveals no gallop and no friction rub.  No murmur heard. Pulmonary/Chest: Effort normal and breath sounds normal. No stridor. No respiratory distress. She has no wheezes. She has no rales.  Abdominal: Soft. Bowel sounds are normal. She exhibits no distension and no mass. There is no tenderness. There is no rebound and no guarding.  Musculoskeletal: She exhibits no edema or tenderness.  Moves  x 4 extremities.unsteady gait ambulates with Rolator.  Lymphadenopathy:    She has no cervical adenopathy.  Neurological: She is oriented to person, place, and time. She has normal strength. Gait abnormal.  Skin: Skin is warm and dry. No rash noted. No erythema. No pallor.  Psychiatric: She has a normal mood and affect. Her speech is normal and behavior is normal. Judgment and thought content normal.    Labs reviewed: Recent Labs    03/30/17 0604 03/30/17 1016 03/31/17 0557 04/03/17  06/27/17 1135  07/13/17  08/31/17 09/07/17 10/05/17  NA 127*  --  127* 127   < > 128*   < > 128*  128   < > 127 126 125  K 4.0  --  4.1 4.0   < > 4.2   < > 3.9  3.9   < > 4.1 4.1 4.1  CL 97*  --  97* 96  --  97*  --  97  --   --   --   --   CO2 19*  --  24  --   --  23  --  24  --   --   --   --   GLUCOSE 114*  --  96  --   --  114*  --   --   --   --   --   --   BUN 21*  --  21* 24*   < > 23*   < > 21  21   < > 24* 21 37*  CREATININE 1.03*  --  1.15* 1.12   < > 1.06*   < > 1.0  0.98   < > 1.05 1.06 1.01  CALCIUM 8.7*  --  8.4* 8.4  --  8.6*  --  8.2  8.2   < > 8.7 9.0 9.4  MG  --  2.1  --   --   --   --   --   --   --   --   --   --   PHOS  --  2.6  --   --   --   --   --   --   --   --   --   --    < > = values in this interval not displayed.   Recent Labs    03/29/17 03/31/17 0557 07/03/17 08/31/17  AST 15 19 26 20   ALT 11 13*  17 1.57  ALKPHOS 47 47 45 53  BILITOT 0.8 0.9  --  0.8  PROT 6.0 6.2*  --  5.5  ALBUMIN 3.6 3.3*  --  3.3   Recent Labs    03/30/17 0604 03/31/17 0557 06/27/17 1135 07/03/17 08/11/17 08/31/17  WBC 5.2 4.5 5.2 6.2 4.8 7  HGB 11.8* 11.0* 12.8 12.0 12.1 11.4  HCT 33.2* 32.4* 36.9 34* 34.5 32.1  MCV 95.1 97.3 97.9  --   --   --   PLT 143* 143* 172 188  --   --    Lab Results  Component Value Date   TSH 1.57 08/31/2017   No results found for: HGBA1C Lab Results  Component Value Date   CHOL 175 11/10/2014   HDL 53 11/10/2014   LDLCALC 87 11/10/2014   TRIG 173 (A) 11/10/2014    Significant Diagnostic Results in last 30 days:  No results found.  Assessment/Plan 1. Non-intractable vomiting with nausea Afebrile.Negative exam findings.Her chronic hyponatremia could be a contributory factor  to her nausea and vomiting.Monthly BMP drawn this morning awaiting results.will add stat lipase,Amylase,LFT's and CBC/diff to rule out infectious and metabolic causes.Phenergan suppository seems to relief her symptoms.Continue to encourage fluids.clear liquids diet x 24 . Hour for bowel rests.Protonix 40 mg tablet daily for reflux.Zofran 4 mg tablet oneby mouth every 8 hours as needed for nausea or vomiting.Patient's POA Mr.[name] Beckum update on patient's condition and plan of care will be in to see patient later today.  2. Poor appetite Feels nauseous just looking at food.Diet changed to regular per patient's preference but now states prefers creamy soups and protein supplements.continue to follow up with Registered Dietician.Obtain lab work as above.clear liquid diet x 24 hours then resume previous diet.  3. Benign hypertension with CKD (chronic kidney disease) stage III B/p log reviewed few morning blood pressure in the 170's/80's but overall most blood pressure readings ranging in the 120's/50's-150's/80's with HR 60's-80's. Continue on Atenolol 50 mg tablet daily, avapro 300 mg tablet daily  and Hydralazine.continue to monitor.  Family/ staff Communication: Reviewed plan of care with Dr.Pandey,patient patient's POA Mr.clifford Pippins and facility Nurse.  Labs/tests ordered: Stat lipase,Amylase,LFT's and CBC/diff.abdominal Ultra sound.  Addendum:  Facility Nurse reported this afternoon that patient was asleep in bed periods of Apnea noted.POA was notified requested patient's percocet to be reduced. Provided and facility Nurse supervisor later observed patient walking from the bathroom alert and oriented.Has had no more episodes of nausea or vomiting.drinking her boost at bedside.Provider discussed need to reduce her percocet dosage but patient refused for her pain medication to be adjusted " Pain medication is working well now don't change it ". Patient's POA Mr.[name] Annye Asa notified by Nurse will hold on changing medication until son talks to patient.  Sandrea Hughs, NP

## 2017-10-20 ENCOUNTER — Encounter: Payer: Self-pay | Admitting: *Deleted

## 2017-10-20 DIAGNOSIS — R1311 Dysphagia, oral phase: Secondary | ICD-10-CM | POA: Diagnosis not present

## 2017-10-20 DIAGNOSIS — R2681 Unsteadiness on feet: Secondary | ICD-10-CM | POA: Diagnosis not present

## 2017-10-20 DIAGNOSIS — S329XXA Fracture of unspecified parts of lumbosacral spine and pelvis, initial encounter for closed fracture: Secondary | ICD-10-CM | POA: Diagnosis not present

## 2017-10-20 DIAGNOSIS — R112 Nausea with vomiting, unspecified: Secondary | ICD-10-CM | POA: Diagnosis not present

## 2017-10-20 DIAGNOSIS — R42 Dizziness and giddiness: Secondary | ICD-10-CM | POA: Diagnosis not present

## 2017-10-20 DIAGNOSIS — M6281 Muscle weakness (generalized): Secondary | ICD-10-CM | POA: Diagnosis not present

## 2017-10-20 DIAGNOSIS — S72141A Displaced intertrochanteric fracture of right femur, initial encounter for closed fracture: Secondary | ICD-10-CM | POA: Diagnosis not present

## 2017-10-20 LAB — HEPATIC FUNCTION PANEL
ALT: 16
AST: 28
Albumin: 3.8
Alkaline Phosphatase: 76
BILIRUBIN DIRECT: 0.2 (ref 0.01–0.4)
BILIRUBIN, INDIRECT: 0.6
TOTAL PROTEIN: 6.7 g/dL
Total Bilirubin: 0.8

## 2017-10-22 DIAGNOSIS — M6281 Muscle weakness (generalized): Secondary | ICD-10-CM | POA: Diagnosis not present

## 2017-10-22 DIAGNOSIS — R2681 Unsteadiness on feet: Secondary | ICD-10-CM | POA: Diagnosis not present

## 2017-10-22 DIAGNOSIS — R1311 Dysphagia, oral phase: Secondary | ICD-10-CM | POA: Diagnosis not present

## 2017-10-22 DIAGNOSIS — S72141A Displaced intertrochanteric fracture of right femur, initial encounter for closed fracture: Secondary | ICD-10-CM | POA: Diagnosis not present

## 2017-10-22 DIAGNOSIS — S329XXA Fracture of unspecified parts of lumbosacral spine and pelvis, initial encounter for closed fracture: Secondary | ICD-10-CM | POA: Diagnosis not present

## 2017-10-22 DIAGNOSIS — R42 Dizziness and giddiness: Secondary | ICD-10-CM | POA: Diagnosis not present

## 2017-10-23 ENCOUNTER — Encounter: Payer: Self-pay | Admitting: *Deleted

## 2017-10-23 DIAGNOSIS — R2681 Unsteadiness on feet: Secondary | ICD-10-CM | POA: Diagnosis not present

## 2017-10-23 DIAGNOSIS — M6281 Muscle weakness (generalized): Secondary | ICD-10-CM | POA: Diagnosis not present

## 2017-10-23 DIAGNOSIS — R1311 Dysphagia, oral phase: Secondary | ICD-10-CM | POA: Diagnosis not present

## 2017-10-23 DIAGNOSIS — R42 Dizziness and giddiness: Secondary | ICD-10-CM | POA: Diagnosis not present

## 2017-10-23 DIAGNOSIS — S72141A Displaced intertrochanteric fracture of right femur, initial encounter for closed fracture: Secondary | ICD-10-CM | POA: Diagnosis not present

## 2017-10-23 DIAGNOSIS — S329XXA Fracture of unspecified parts of lumbosacral spine and pelvis, initial encounter for closed fracture: Secondary | ICD-10-CM | POA: Diagnosis not present

## 2017-10-24 DIAGNOSIS — S72141A Displaced intertrochanteric fracture of right femur, initial encounter for closed fracture: Secondary | ICD-10-CM | POA: Diagnosis not present

## 2017-10-24 DIAGNOSIS — M6281 Muscle weakness (generalized): Secondary | ICD-10-CM | POA: Diagnosis not present

## 2017-10-24 DIAGNOSIS — R1311 Dysphagia, oral phase: Secondary | ICD-10-CM | POA: Diagnosis not present

## 2017-10-24 DIAGNOSIS — R42 Dizziness and giddiness: Secondary | ICD-10-CM | POA: Diagnosis not present

## 2017-10-24 DIAGNOSIS — R112 Nausea with vomiting, unspecified: Secondary | ICD-10-CM | POA: Diagnosis not present

## 2017-10-24 DIAGNOSIS — S329XXA Fracture of unspecified parts of lumbosacral spine and pelvis, initial encounter for closed fracture: Secondary | ICD-10-CM | POA: Diagnosis not present

## 2017-10-24 DIAGNOSIS — R2681 Unsteadiness on feet: Secondary | ICD-10-CM | POA: Diagnosis not present

## 2017-10-24 LAB — CBC AND DIFFERENTIAL
HCT: 33 — AB (ref 36–46)
Hemoglobin: 11.8 — AB (ref 12.0–16.0)
Platelets: 173 (ref 150–399)
WBC: 5.2

## 2017-10-25 DIAGNOSIS — R42 Dizziness and giddiness: Secondary | ICD-10-CM | POA: Diagnosis not present

## 2017-10-25 DIAGNOSIS — S329XXA Fracture of unspecified parts of lumbosacral spine and pelvis, initial encounter for closed fracture: Secondary | ICD-10-CM | POA: Diagnosis not present

## 2017-10-25 DIAGNOSIS — R2681 Unsteadiness on feet: Secondary | ICD-10-CM | POA: Diagnosis not present

## 2017-10-25 DIAGNOSIS — S72141A Displaced intertrochanteric fracture of right femur, initial encounter for closed fracture: Secondary | ICD-10-CM | POA: Diagnosis not present

## 2017-10-25 DIAGNOSIS — R1311 Dysphagia, oral phase: Secondary | ICD-10-CM | POA: Diagnosis not present

## 2017-10-25 DIAGNOSIS — M6281 Muscle weakness (generalized): Secondary | ICD-10-CM | POA: Diagnosis not present

## 2017-10-25 NOTE — Patient Instructions (Signed)
Opened in error

## 2017-10-25 NOTE — Progress Notes (Signed)
Opened in error

## 2017-10-30 DIAGNOSIS — R112 Nausea with vomiting, unspecified: Secondary | ICD-10-CM | POA: Diagnosis not present

## 2017-10-30 LAB — AMYLASE: Amylase: 45

## 2017-10-30 LAB — LIPASE: Lipase: 30

## 2017-10-31 ENCOUNTER — Encounter: Payer: Self-pay | Admitting: *Deleted

## 2017-10-31 DIAGNOSIS — R2681 Unsteadiness on feet: Secondary | ICD-10-CM | POA: Diagnosis not present

## 2017-10-31 DIAGNOSIS — S329XXA Fracture of unspecified parts of lumbosacral spine and pelvis, initial encounter for closed fracture: Secondary | ICD-10-CM | POA: Diagnosis not present

## 2017-10-31 DIAGNOSIS — R1311 Dysphagia, oral phase: Secondary | ICD-10-CM | POA: Diagnosis not present

## 2017-10-31 DIAGNOSIS — S72141A Displaced intertrochanteric fracture of right femur, initial encounter for closed fracture: Secondary | ICD-10-CM | POA: Diagnosis not present

## 2017-10-31 DIAGNOSIS — M6281 Muscle weakness (generalized): Secondary | ICD-10-CM | POA: Diagnosis not present

## 2017-10-31 DIAGNOSIS — R42 Dizziness and giddiness: Secondary | ICD-10-CM | POA: Diagnosis not present

## 2017-11-01 DIAGNOSIS — S329XXA Fracture of unspecified parts of lumbosacral spine and pelvis, initial encounter for closed fracture: Secondary | ICD-10-CM | POA: Diagnosis not present

## 2017-11-01 DIAGNOSIS — M6281 Muscle weakness (generalized): Secondary | ICD-10-CM | POA: Diagnosis not present

## 2017-11-01 DIAGNOSIS — S72141A Displaced intertrochanteric fracture of right femur, initial encounter for closed fracture: Secondary | ICD-10-CM | POA: Diagnosis not present

## 2017-11-01 DIAGNOSIS — R2681 Unsteadiness on feet: Secondary | ICD-10-CM | POA: Diagnosis not present

## 2017-11-01 DIAGNOSIS — R1311 Dysphagia, oral phase: Secondary | ICD-10-CM | POA: Diagnosis not present

## 2017-11-01 DIAGNOSIS — R42 Dizziness and giddiness: Secondary | ICD-10-CM | POA: Diagnosis not present

## 2017-11-02 DIAGNOSIS — I10 Essential (primary) hypertension: Secondary | ICD-10-CM | POA: Diagnosis not present

## 2017-11-02 LAB — BASIC METABOLIC PANEL
BUN: 23 — AB (ref 4–21)
CREATININE: 1.1 (ref 0.5–1.1)
GLUCOSE: 92
POTASSIUM: 4 (ref 3.4–5.3)
Sodium: 130 — AB (ref 137–147)

## 2017-11-03 LAB — BASIC METABOLIC PANEL
CHLORIDE: 96
Calcium: 9.1
Carbon Dioxide, Total: 27
EGFR (Non-African Amer.): 47

## 2017-11-06 DIAGNOSIS — R1311 Dysphagia, oral phase: Secondary | ICD-10-CM | POA: Diagnosis not present

## 2017-11-06 DIAGNOSIS — M6281 Muscle weakness (generalized): Secondary | ICD-10-CM | POA: Diagnosis not present

## 2017-11-06 DIAGNOSIS — R2681 Unsteadiness on feet: Secondary | ICD-10-CM | POA: Diagnosis not present

## 2017-11-06 DIAGNOSIS — S72141A Displaced intertrochanteric fracture of right femur, initial encounter for closed fracture: Secondary | ICD-10-CM | POA: Diagnosis not present

## 2017-11-06 DIAGNOSIS — R42 Dizziness and giddiness: Secondary | ICD-10-CM | POA: Diagnosis not present

## 2017-11-06 DIAGNOSIS — S329XXA Fracture of unspecified parts of lumbosacral spine and pelvis, initial encounter for closed fracture: Secondary | ICD-10-CM | POA: Diagnosis not present

## 2017-11-09 DIAGNOSIS — M6281 Muscle weakness (generalized): Secondary | ICD-10-CM | POA: Diagnosis not present

## 2017-11-09 DIAGNOSIS — R42 Dizziness and giddiness: Secondary | ICD-10-CM | POA: Diagnosis not present

## 2017-11-09 DIAGNOSIS — R1311 Dysphagia, oral phase: Secondary | ICD-10-CM | POA: Diagnosis not present

## 2017-11-09 DIAGNOSIS — R2681 Unsteadiness on feet: Secondary | ICD-10-CM | POA: Diagnosis not present

## 2017-11-09 DIAGNOSIS — S72141A Displaced intertrochanteric fracture of right femur, initial encounter for closed fracture: Secondary | ICD-10-CM | POA: Diagnosis not present

## 2017-11-09 DIAGNOSIS — S329XXA Fracture of unspecified parts of lumbosacral spine and pelvis, initial encounter for closed fracture: Secondary | ICD-10-CM | POA: Diagnosis not present

## 2017-11-27 ENCOUNTER — Non-Acute Institutional Stay: Payer: Medicare Other | Admitting: Family

## 2017-11-27 ENCOUNTER — Encounter: Payer: Self-pay | Admitting: Family

## 2017-11-27 DIAGNOSIS — R630 Anorexia: Secondary | ICD-10-CM | POA: Diagnosis not present

## 2017-11-27 DIAGNOSIS — F321 Major depressive disorder, single episode, moderate: Secondary | ICD-10-CM | POA: Diagnosis not present

## 2017-11-27 DIAGNOSIS — R634 Abnormal weight loss: Secondary | ICD-10-CM | POA: Diagnosis not present

## 2017-11-27 NOTE — Progress Notes (Signed)
Location:  [facility] Room Number: 35 Place of Service:  ALF [phone]) Provider: Dinah Ngetich FNP-C  Blanchie Serve, MD  Patient Care Team: Blanchie Serve, MD as PCP - General (Internal Medicine) Ngetich, Nelda Bucks, NP as Nurse Practitioner (Family Medicine)  Extended Emergency Contact Information Primary Emergency Contact: Cumbee,[name] Address: [address] [place], [place] [address] [place] Mobile Phone: [phone] Relation: Son  Code Status:  DNR Goals of care: Advanced Directive information Advanced Directives 11/27/2017  Does Patient Have a Medical Advance Directive? Yes  Type of Paramedic of [place];Out of facility DNR (pink MOST or yellow form);Living will  Does patient want to make changes to medical advance directive? -  Copy of [place] in Chart? Yes  Would patient like information on creating a medical advance directive? -  Pre-existing out of facility DNR order (yellow form or pink MOST form) Yellow form placed in chart (order not valid for inpatient use);Pink MOST form placed in chart (order not valid for inpatient use)     Chief Complaint  Patient presents with  . Acute Visit    weight loss    HPI:  Pt is a 84 y.o. female seen today at [name] for an acute visit for evaluation of weight loss.she is seen in her room today with facility Nurse supervisor present at bedside.she has had a progressive weight loss: Wt 130.4 lbs (07/2017);Wt 126.4 lbs (08/2017);Wt 123.8 lbs (09/2017);Wt 124.8 lbs (10/2017);Wt 119.2 lbs (11/2017).she states does not like the test of the food and continues to complain of nausea without any vomiting.she drinks 100% of her protein supplement.she tells provided and Nurse supervisor that she is 84 years and does not want to live anymore.she states wants to be with her deceased husband whom she misses.she denies feeling depressed though repeats  that she doesn't want to live anymore.She request Marinol or cannabis to be prescribed for nausea and pain.discussed with patient other option that need to try first for nausea.Also talked with patient's son on the phone in presence of the Nurse supervisor concerning patient's progressive weight loss and signs of depression.Patient had refused addition of an appetite stimulant but later accepted to discontinue Decubi vite and initiate Remeron.        Past Medical History:  Diagnosis Date  . Acquired cyst of kidney    left  . Allergic rhinitis, cause unspecified   . Anemia, unspecified   . Chronic hyponatremia 09/05/2015  . CKD stage 2 due to type 2 diabetes mellitus ([place]) 11/18/2014  . Closed fracture of unspecified part of vertebral column without mention of spinal cord injury    T7 s/p kyphoplasty, T12  . Edema 2015  . Herpes simplex without mention of complication   . History of Epstein-Barr virus infection   . Hyposmolality and/or hyponatremia    07/13/15 Na 131  . Insomnia   . Insomnia, unspecified   . Left cavernous carotid aneurysm 08/2008   1-1/3mm aneurysm of cavernous carotid artery  . Malignant neoplasm of breast (female), unspecified site 1990   left. Had surgerey and chemo, but no radiation  . 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 Neuropathy   . Osteoarthrosis of knee    bilaterally  . Osteoarthrosis, hip   . Osteoarthrosis, unspecified whether generalized or localized, forearm    bilaterally wrist  .  Other and unspecified nonspecific immunological findings    positive ANA  . Other B-complex deficiencies   . Pain in joint, site unspecified    severe, diffuse, chronic pain  . Positive ANA (antinuclear antibody)   . Pulmonary embolism ([place]) 05/07/2015  . Pure hypercholesterolemia   . Senile osteoporosis   . Unspecified essential hypertension   . Unspecified gastritis and gastroduodenitis without mention of hemorrhage     . Unspecified hypothyroidism   . Unspecified vitamin D deficiency    Past Surgical History:  Procedure Laterality Date  . BREAST SURGERY Left   . DILATATION & CURETTAGE/HYSTEROSCOPY WITH MYOSURE N/A 06/27/2017   Procedure: DILATATION & CURETTAGE/HYSTEROSCOPY WITH MYOSURE;  Surgeon: Servando Salina, MD;  Location: [facility];  Service: Gynecology;  Laterality: N/A;  . DILATION AND CURETTAGE OF UTERUS  X 2  . FEMUR IM NAIL Right 03/18/2015   Procedure: INTRAMEDULLARY (IM) NAIL FEMORAL;  Surgeon: Rod Can, MD;  Location: [facility];  Service: Orthopedics;  Laterality: Right;  . FRACTURE SURGERY     hip  . KYPHOPLASTY  07/03/2010   T12  . KYPHOPLASTY     C7  . LAPAROSCOPIC CHOLECYSTECTOMY  09/20/2006  . MASTECTOMY Left 04/29/1989  . MIDDLE EAR SURGERY Bilateral 1938  . NASAL SINUS SURGERY  1990 & 2000  . ORIF HIP FRACTURE Left 06/13/2007   following a syncopal episode  . TONSILLECTOMY  1968    Allergies  Allergen Reactions  . Aspirin     Drop in body temp with large quantities, can tolerate low doses of aspirin  . Penicillins Swelling    Has patient had a PCN reaction causing immediate rash, facial/tongue/throat swelling, SOB or lightheadedness with hypotension: No Has patient had a PCN reaction causing severe rash involving mucus membranes or skin necrosis: No Has patient had a PCN reaction that required hospitalization No Has patient had a PCN reaction occurring within the last 10 years: No If all of the above answers are "NO", then may proceed with Cephalosporin use.    Outpatient Encounter Medications as of 11/27/2017  Medication Sig  . acetaminophen (TYLENOL) 325 MG tablet Take 650 mg by mouth every 4 (four) hours as needed for fever.  Marland Kitchen acetaminophen (TYLENOL) 500 MG tablet Take 1,000 mg by mouth at bedtime.   Marland Kitchen acetaminophen (TYLENOL) 500 MG tablet Take 500 mg by mouth daily. One tablet in am and 2 tablet in pm  . Amino Acids-Protein Hydrolys (FEEDING SUPPLEMENT, PRO-STAT  SUGAR FREE 64,) LIQD Take 30 mLs by mouth 2 (two) times daily.   Marland Kitchen atenolol (TENORMIN) 50 MG tablet Take 50 mg by mouth at bedtime. Hold for SBP <110 or HR <60   . feeding supplement (BOOST / RESOURCE BREEZE) LIQD Take 1 Container by mouth 2 (two) times daily between meals. (1000 & 1600) Prefers Chocolate  . hydrALAZINE (APRESOLINE) 50 MG tablet Take 100 mg by mouth as directed. 100mg  twice daily at 6AM and 8PM; 50 mg daily at [place] for SBP <110  . irbesartan (AVAPRO) 300 MG tablet Take 300 mg by mouth daily. (0800) Hold for BP <90/60  . levothyroxine (SYNTHROID, LEVOTHROID) 150 MCG tablet Take 150 mcg by mouth daily before breakfast. (0730)  . Lidocaine (ASPERCREME LIDOCAINE) 4 % PTCH Apply 3 patches topically once. Apply 1 patch to the left hip, left knee, and the right shoulder in the morning. Remove in the evening.  . loratadine (CLARITIN) 10 MG tablet Take 1 tablet (10 mg total) by mouth daily.  Marland Kitchen  Menthol, Topical Analgesic, (BIOFREEZE) 4 % GEL Apply 1 application topically 3 (three) times daily as needed.  . Multiple Vitamins-Minerals (DECUBI-VITE PO) Take 1 tablet by mouth daily.  . ondansetron (ZOFRAN) 4 MG tablet Take 4 mg by mouth every 8 (eight) hours as needed for nausea or vomiting.  Marland Kitchen oxyCODONE-acetaminophen (PERCOCET) 10-325 MG tablet Take 1 tablet by mouth as directed. Take one tablet four times daily and one tablet at 12AM if needed  . pantoprazole (PROTONIX) 40 MG tablet Take 40 mg by mouth daily.  . polyethylene glycol (MIRALAX / GLYCOLAX) packet Take 17 g by mouth daily.  . psyllium (METAMUCIL) 58.6 % powder Take 1 packet by mouth daily. Mix in 6 oz of water or juice  . ranitidine (ZANTAC) 75 MG tablet Take 75 mg by mouth at bedtime.   . senna-docusate (SENOKOT S) 8.6-50 MG tablet 1 tablet nightly to prevent constipation  . sodium chloride 1 g tablet Take 1 g by mouth daily.  Marland Kitchen UNABLE TO FIND Med Name: Hydrocolloid dressing 4x4. Change dressing to the coccyx as needed. Check  patency daily  . zinc oxide (BALMEX) 11.3 % CREA cream Apply 1 application topically 2 (two) times daily.  Marland Kitchen nystatin-triamcinolone (MYCOLOG II) cream Apply 1 application topically 2 (two) times daily as needed.    No facility-administered encounter medications on file as of 11/27/2017.     Review of Systems  Constitutional: Positive for appetite change and unexpected weight change. Negative for chills, fatigue and fever.  HENT: Negative for congestion, rhinorrhea, sinus pressure, sinus pain, sneezing, sore throat and trouble swallowing.   Eyes: Negative for discharge, redness and itching.  Respiratory: Negative for cough, chest tightness, shortness of breath and wheezing.   Cardiovascular: Negative for chest pain, palpitations and leg swelling.  Gastrointestinal: Negative for abdominal distention, abdominal pain, constipation, diarrhea and vomiting.       Chronic nausea   Endocrine: Negative for cold intolerance and heat intolerance.  Genitourinary: Negative for dysuria, frequency and urgency.  Musculoskeletal: Positive for arthralgias and gait problem.  Skin: Negative for color change, pallor, rash and wound.  Neurological: Negative for dizziness, light-headedness and headaches.  Psychiatric/Behavioral: Negative for agitation, confusion, sleep disturbance and suicidal ideas. The patient is not nervous/anxious.     Immunization History  Administered Date(s) Administered  . Influenza Split 02/13/2013  . Influenza-Unspecified 02/27/2014, 02/12/2015, 02/25/2016, 03/08/2017  . PPD Test 09/09/2015  . Pneumococcal Conjugate-13 05/05/2016  . Pneumococcal Polysaccharide-23 02/13/2013  . Td 05/05/2016  . Zoster 05/04/2016   Pertinent  Health Maintenance Due  Topic Date Due  . MAMMOGRAM  03/21/1945  . INFLUENZA VACCINE  12/14/2017  . DEXA SCAN  Completed  . PNA vac Low Risk Adult  Completed   Fall Risk  01/02/2017 11/10/2015 10/20/2015 10/20/2015 06/30/2015  Falls in the past year? No No Yes  No No  Number falls in past yr: - - 2 or more - -  Injury with Fall? - - Yes - -  Risk Factor Category  - - - - -  Risk for fall due to : - - History of fall(s);Impaired balance/gait - -  Follow up - - - - -   Functional Status Survey:    Vitals:   11/27/17 1306  BP: (!) 175/82  Pulse: (!) 56  Resp: 16  Temp: 97.7 F (36.5 C)  SpO2: 95%  Weight: 119 lb 3.2 oz (54.1 kg)  Height: 5\' 8"  (1.727 m)   Body mass index is 18.12 kg/m. Physical  Exam  Constitutional: She is oriented to person, place, and time.  Tall frail elderly in no acute distress   HENT:  Head: Normocephalic.  Mouth/Throat: Oropharynx is clear and moist. No oropharyngeal exudate.  Eyes: Pupils are equal, round, and reactive to light. Conjunctivae and EOM are normal. Right eye exhibits no discharge. Left eye exhibits no discharge. No scleral icterus.  Neck: Normal range of motion. No JVD present. No thyromegaly present.  Cardiovascular: Normal rate, regular rhythm, normal heart sounds and intact distal pulses. Exam reveals no gallop and no friction rub.  No murmur heard. Pulmonary/Chest: Effort normal and breath sounds normal. No respiratory distress. She has no wheezes. She has no rales.  Abdominal: Soft. Bowel sounds are normal. She exhibits no distension and no mass. There is no tenderness. There is no rebound and no guarding.  Musculoskeletal: She exhibits no edema or tenderness.  Unsteady gait ambulate with Rolator.Arthritic changes to fingers and knees  Lymphadenopathy:    She has no cervical adenopathy.  Neurological: She is oriented to person, place, and time. Gait abnormal.  Skin: Skin is warm and dry. No rash noted. No erythema. There is pallor.  Psychiatric: Her speech is normal and behavior is normal. Judgment normal. She exhibits a depressed mood. She expresses no suicidal ideation.  Despair   Nursing note and vitals reviewed.   Labs reviewed: Recent Labs    03/30/17 0604 03/30/17 1016  03/31/17 0557  06/27/17 1135  07/13/17  10/05/17 10/19/17 11/02/17  NA 127*  --  127*   < > 128*   < > 128*  128   < > 125 128 130*  K 4.0  --  4.1   < > 4.2   < > 3.9  3.9   < > 4.1 3.9 4.0  CL 97*  --  97*   < > 97*  --  97  --   --   --  96  CO2 19*  --  24  --  23  --  24  --   --   --  27  GLUCOSE 114*  --  96  --  114*  --   --   --   --   --   --   BUN 21*  --  21*   < > 23*   < > 21  21   < > 37* 39* 23*  CREATININE 1.03*  --  1.15*   < > 1.06*   < > 1.0  0.98   < > 1.01 1.01 1.1  CALCIUM 8.7*  --  8.4*   < > 8.6*  --  8.2  8.2   < > 9.4 9.1 9.1  MG  --  2.1  --   --   --   --   --   --   --   --   --   PHOS  --  2.6  --   --   --   --   --   --   --   --   --    < > = values in this interval not displayed.   Recent Labs    03/31/17 0557 07/03/17 08/31/17 10/19/17  AST 19 26 20 28   ALT 13* 17 1.57 16  ALKPHOS 47 45 53 76  BILITOT 0.9  --  0.8 0.8  PROT 6.2*  --  5.5 6.7  ALBUMIN 3.3*  --  3.3 3.8   Recent Labs  03/30/17 0604 03/31/17 0557 06/27/17 1135 07/03/17 08/11/17 08/31/17 10/24/17  WBC 5.2 4.5 5.2 6.2 4.8 7 5.2  HGB 11.8* 11.0* 12.8 12.0 12.1 11.4 11.8*  HCT 33.2* 32.4* 36.9 34* 34.5 32.1 33*  MCV 95.1 97.3 97.9  --   --   --   --   PLT 143* 143* 172 188  --   --  173   Lab Results  Component Value Date   TSH 1.57 08/31/2017   No results found for: HGBA1C Lab Results  Component Value Date   CHOL 175 11/10/2014   HDL 53 11/10/2014   LDLCALC 87 11/10/2014   TRIG 173 (A) 11/10/2014    Significant Diagnostic Results in last 30 days:  No results found.  Assessment/Plan 1. Abnormal weight loss Progressive weight loss with poor oral intake.tolerates protein supplements.Depression could be a contributory factor for poor oral intake.I've discussed her weight loss with patient's son Mr.[name] Vandenberg who is the POA.Patient had initially refused to start on an appetite stimulant but later was willing for Decubi vit to be discontinue and start Remeron  7.5 mg tablet one by mouth daily at bedtime x 1 week then increase to 15 mg tablet daily at bedtime.continue to monitor weight.Also discussed with patient's son need for goals of care discussion if patient's weight condition continues to decline.POA aware of options for palliative care.Dr.Pandey wiling to meet with POA and patient to discuss goal of care if needed.     2. Poor appetite  Continue to encourage oral intake.Remeron 7.5 mg tablet as above.   3. Depression  Mentioned multiple times during visit that she is 84 years old and does not want to live anymore.she has also mention to the nursing staff too.she denies any suicidal ideation.initiate Remeron as above for appetite stimulant and mood stabilization.continue to monitor for mood changes.  Family/ staff Communication: Reviewed plan of care with patient,patient's son,Dr.Pandey and facility Nurse supervisor.  Labs/tests ordered: None   Dinah C Ngetich, NP

## 2017-12-07 DIAGNOSIS — E871 Hypo-osmolality and hyponatremia: Secondary | ICD-10-CM | POA: Diagnosis not present

## 2017-12-07 LAB — BASIC METABOLIC PANEL
BUN: 24 — AB (ref 4–21)
Creatinine: 1 (ref <=1.1)
GLUCOSE: 85
Potassium: 3.9 (ref 3.4–5.3)
Sodium: 130 — AB (ref 137–147)

## 2017-12-08 ENCOUNTER — Other Ambulatory Visit: Payer: Self-pay | Admitting: *Deleted

## 2017-12-08 LAB — BASIC METABOLIC PANEL
Calcium: 8.9
Carbon Dioxide, Total: 28
Chloride: 95
EGFR (Non-African Amer.): 48

## 2017-12-21 IMAGING — CT CT ANGIO CHEST-ABD-PELV FOR DISSECTION W/ AND WO/W CM
2 of 7 series · 13 of 46 positions shown, 15 images · IV contrast (OMNI 350)
Comparison: None.

CLINICAL DATA: Chest pain and abnormal EKG.

EXAM:
CT ANGIOGRAPHY CHEST, ABDOMEN AND PELVIS
TECHNIQUE: Multidetector CT imaging through the chest, abdomen and pelvis was
performed using the standard protocol during bolus administration of
intravenous contrast. Multiplanar reconstructed images and MIPs were
obtained and reviewed to evaluate the vascular anatomy.
CONTRAST:  80mL JVDIMO-MZF IOPAMIDOL (JVDIMO-MZF) INJECTION 76%

[Series 8: dissection 2mm (person_name) · axial · 0.83mm/px · z∈[-1154,-584]mm · 10 of 319 slices shown, 12 images]
[im 17/319  soft-tissue]
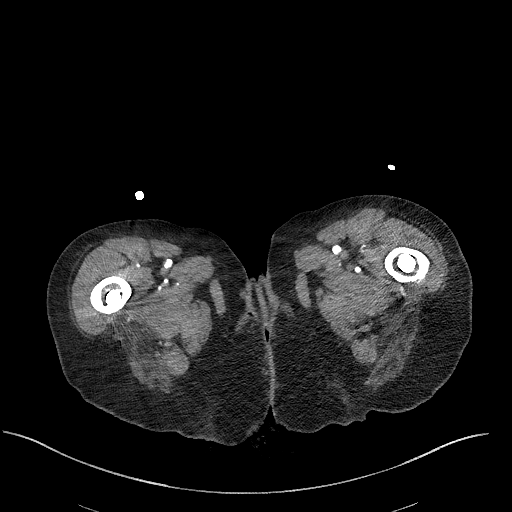
[im 17/319  bone]
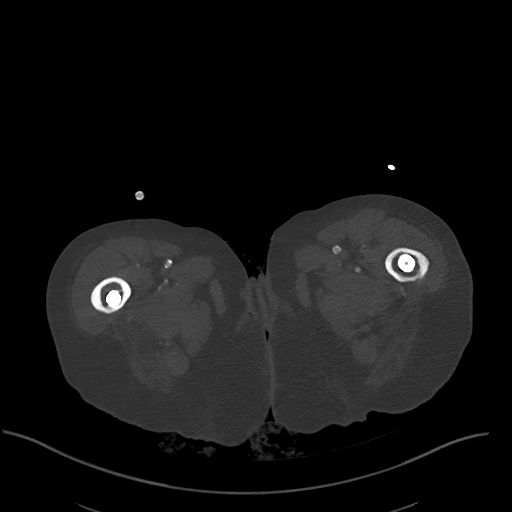
[im 51/319  soft-tissue]
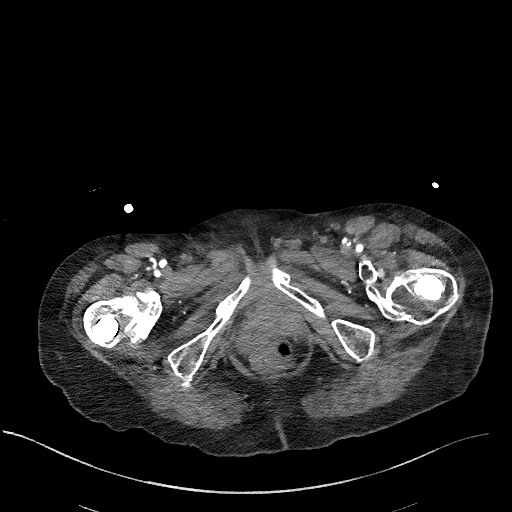
[im 84/319  soft-tissue]
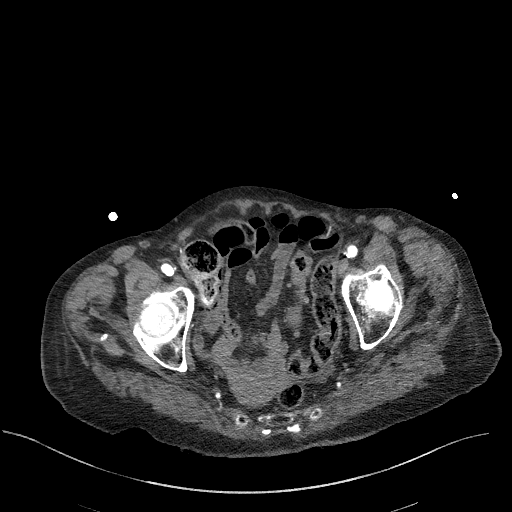
[im 118/319  soft-tissue]
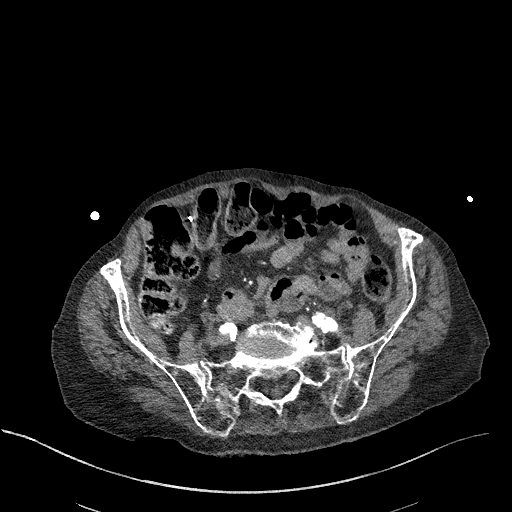
[im 151/319  soft-tissue]
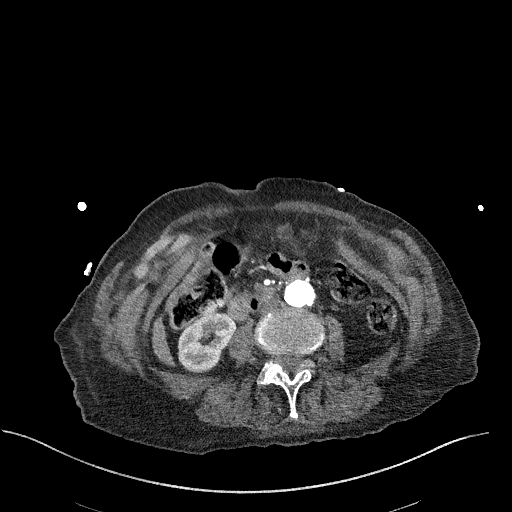
[im 168/319  soft-tissue]
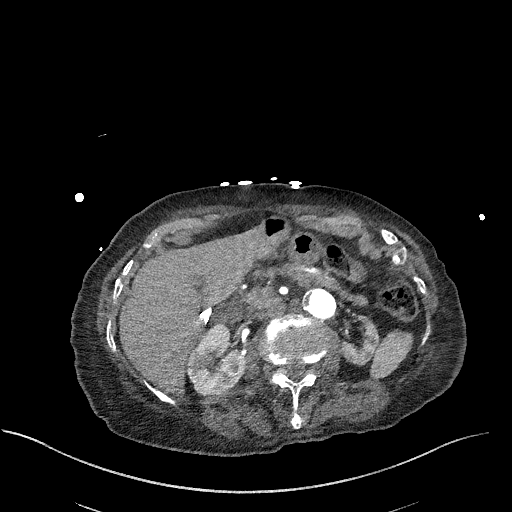
[im 201/319  soft-tissue]
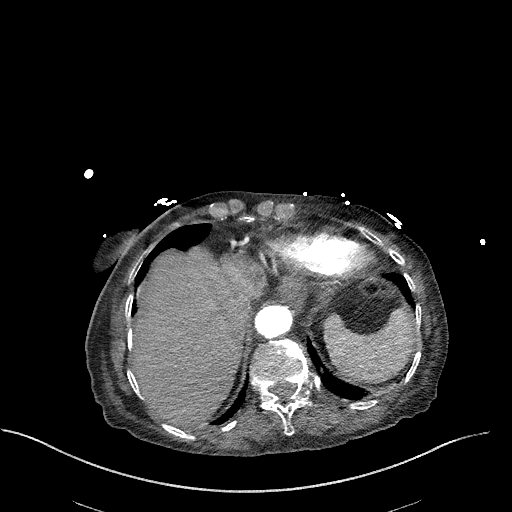
[im 235/319  soft-tissue]
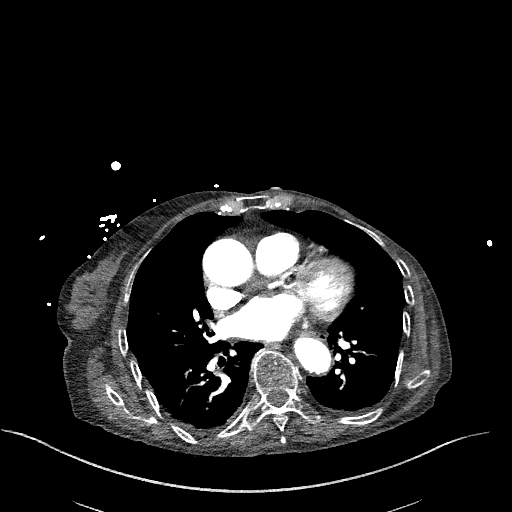
[im 268/319  soft-tissue]
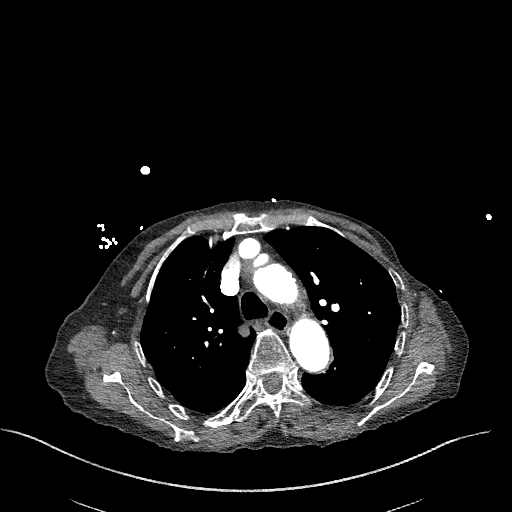
[im 268/319  bone]
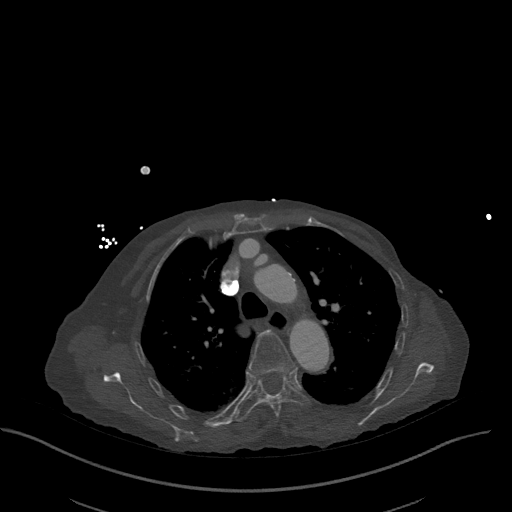
[im 302/319  soft-tissue]
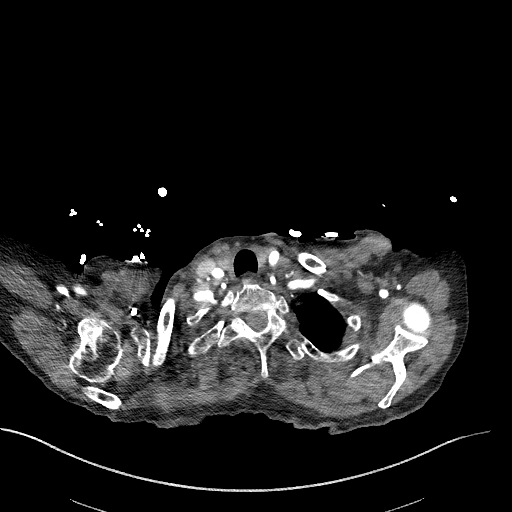

[Series 11: dissection 2mm cor · coronal · 0.86mm/px · 3 of 120 slices shown]
[im 30/120  soft-tissue]
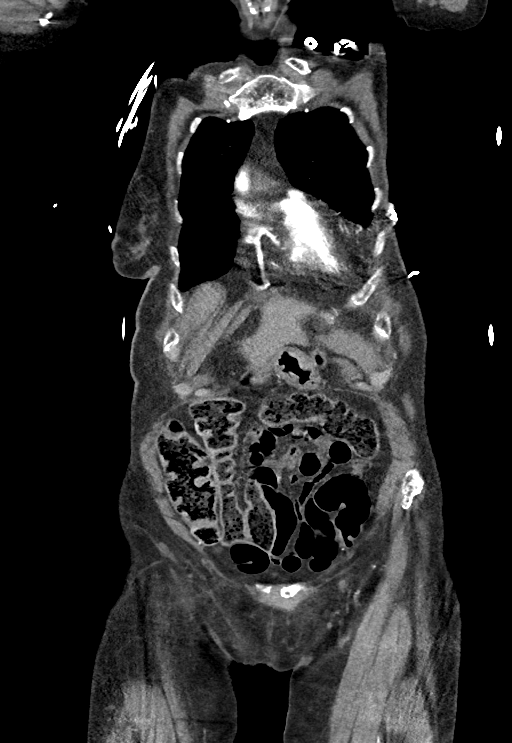
[im 60/120  soft-tissue]
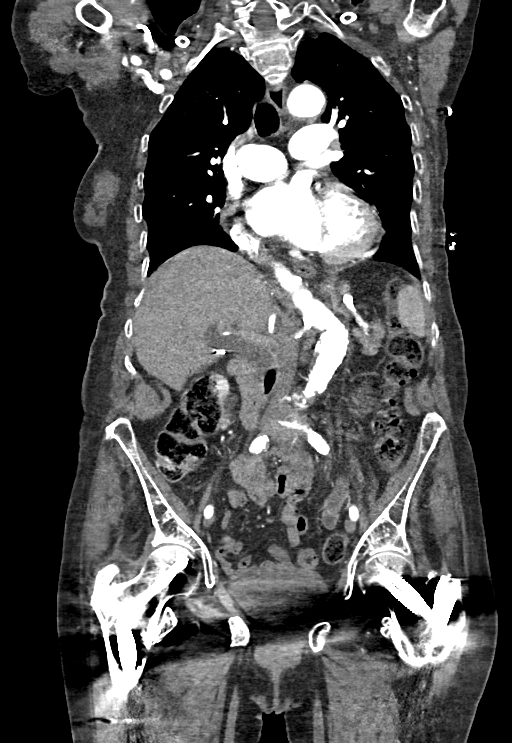
[im 90/120  soft-tissue]
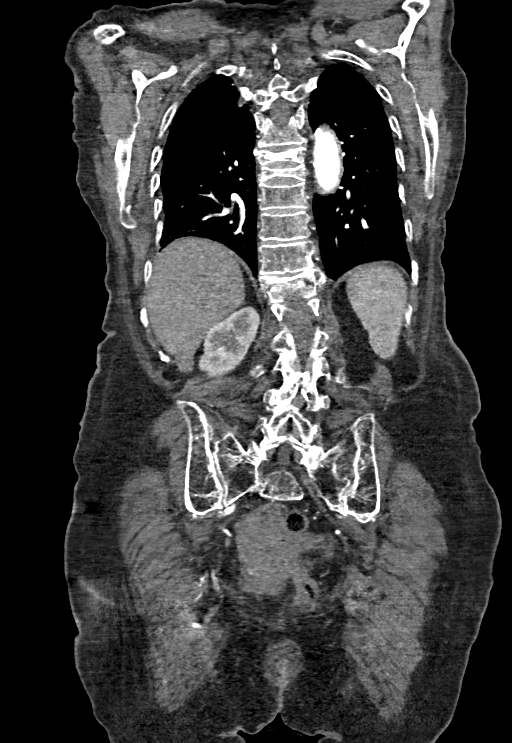

[13 of 46 positions shown; findings below may reference images not displayed]

FINDINGS: CTA CHEST FINDINGS

Cardiovascular: Noncontrast CT shows no intramural hematoma in the
aorta. Postcontrast imaging is affected by motion and streak
artifact from EKG leads at the aortic root. No evidence of
dissection or intramural hematoma. The aorta is tortuous without
aneurysmal dimension. Three vessel branching pattern with patent
great vessels. Atherosclerotic calcification of the aorta and
coronaries. Trace anterior pericardial fluid. No pulmonary artery
filling defect when accounting for streak artifact.

Mediastinum/Nodes: Left mastectomy. No axillary adenopathy. No
thoracic adenopathy. No explanation for left maxillary pain.

Lungs/Pleura: Mosaic attenuation of the lungs, often from small
airways disease. There is no edema, consolidation, effusion, or
pneumothorax. Central airways are clear

Musculoskeletal: See below

Left mastectomy

Review of the MIP images confirms the above findings.

CTA ABDOMEN AND PELVIS FINDINGS

VASCULAR

Aorta: Diffuse atherosclerotic plaque. The aorta is tortuous without
aneurysm. No dissection or evidence of intramural hematoma.

Celiac: Atherosclerosis at the ostium without stenosis or
dissection. Diffuse atherosclerotic calcification of the splenic
artery.

SMA: Atherosclerosis at the ostium without flow limiting stenosis or
dissection.

Renals: Bulky plaque at the left renal artery ostium with poor flow
in the left renal artery. Smooth left renal atrophy attributed to
chronic arterial compromise. Atherosclerosis of the right renal
artery without superimposed dissection or aneurysm.

IMA: Patent

Inflow: Diffuse atherosclerotic plaque on the iliacs. There is
high-grade narrowing at the origin of the left hypogastric artery,
proceeded by lobulated ectasia measuring up to 121 mm in diameter.
There is heavily diseased bilateral superficial femoral arteries,
especially on the left where there are areas of flow gap. High-grade
narrowing at the distal right common femoral artery due to bulky
calcified plaque.

Veins: Negative

Review of the MIP images confirms the above findings.

NON-VASCULAR

Hepatobiliary: No focal liver abnormality. Borderline liver surface
lobulation without other convincing changes of cirrhosis.
Cholecystectomy which likely accounts for the common bile duct
dilatation to 17 mm proximally. Normal biliary labs on recent
evaluation.

Pancreas: Unremarkable.

Spleen: Unremarkable.

Adrenals/Urinary Tract: Negative adrenals. Smooth left renal atrophy
from left renal artery compromise. No hydronephrosis or stone.
Unremarkable bladder.

Stomach/Bowel: Formed stool throughout the elongated colon. No bowel
obstruction or visible inflammation. Mild colonic diverticulosis.
Uncomplicated duodenal diverticulum. No appendicitis.

Lymphatic:   No mass or adenopathy.

Reproductive:Hysterectomy.  Pelvic floor laxity.

Other: No ascites or pneumoperitoneum.

Musculoskeletal: Remote T6, T7, T8, T12, L1, and L2 compression
fractures. Remote manubrial fracture. Status post cement
augmentation at T7 and T12. Osteopenia, scoliosis, and spinal
degeneration. Remote bilateral intertrochanteric femur fracture with
ORIF. Remote obturator ring fractures. Probable remote sacral
insufficiency fractures.

Review of the MIP images confirms the above findings.
IMPRESSION: 1. No evidence of acute aortic syndrome. No acute finding throughout
the chest and abdomen.
2.  Aortic Atherosclerosis ([id number]).
3. Chronic atherosclerotic compromise of the left renal artery with
left renal atrophy.
4. Ectatic proximal left hypogastric artery measuring 12 mm.
5. Heavily diseased bilateral common femoral and superficial femoral
arteries with SFA stenosis greater on the left.
6. Incidental findings noted above.

## 2017-12-21 IMAGING — CT CT HEAD W/O CM
4 series · 17 of 47 positions shown, 19 images · non-contrast
Comparison: MRI head 09/08/2015

CLINICAL DATA: Acute headache

EXAM:
CT HEAD WITHOUT CONTRAST
TECHNIQUE: Contiguous axial images were obtained from the base of the skull
through the vertex without intravenous contrast.

[Series 3: head without · axial · non-contrast · 0.42mm/px · z∈[-139,-14]mm · 6 of 36 slices shown, 8 images]
[im 6/36  brain]
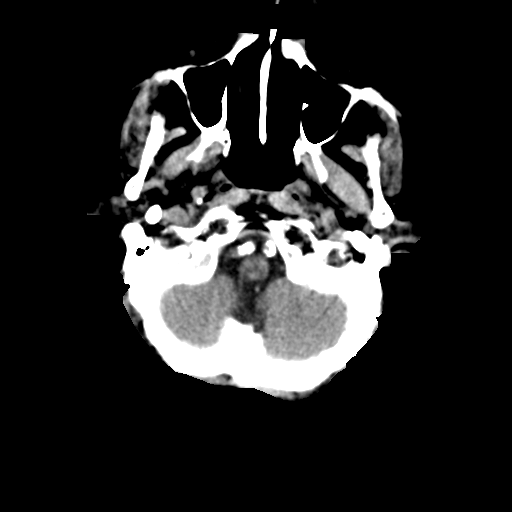
[im 6/36  bone]
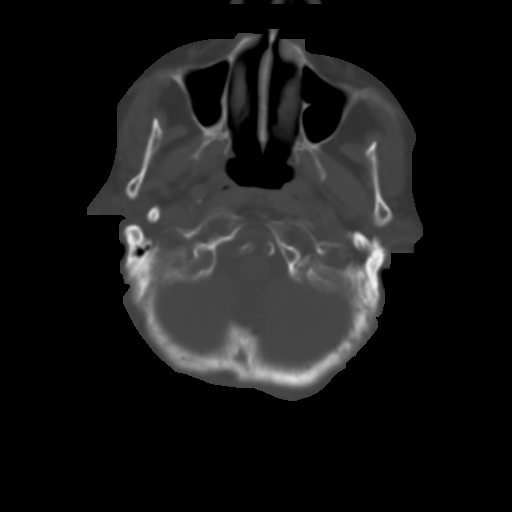
[im 11/36  brain]
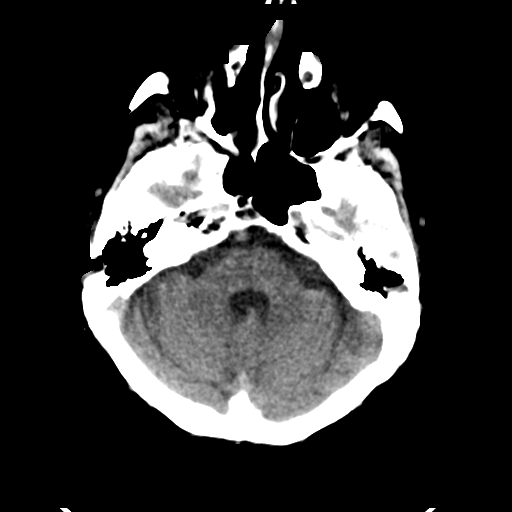
[im 16/36  brain]
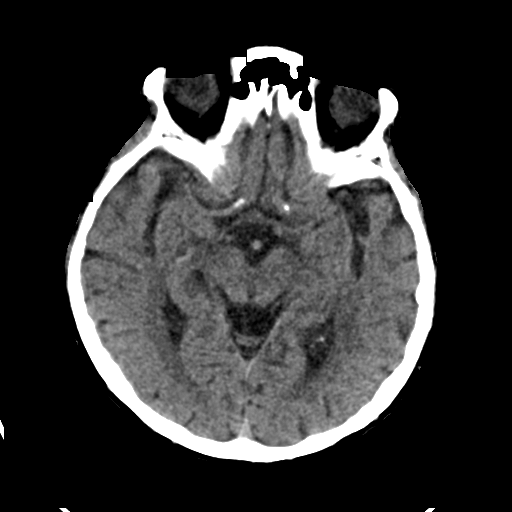
[im 21/36  brain]
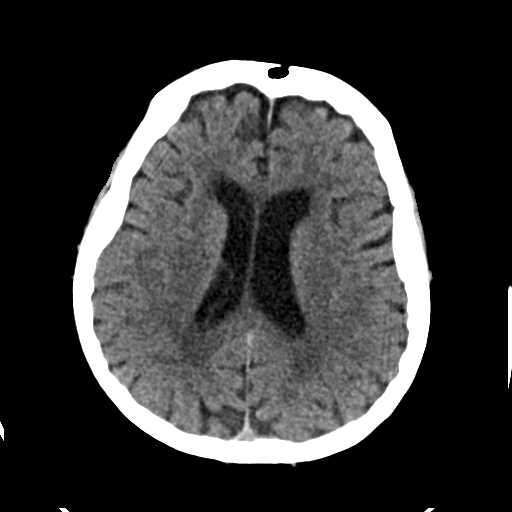
[im 26/36  brain]
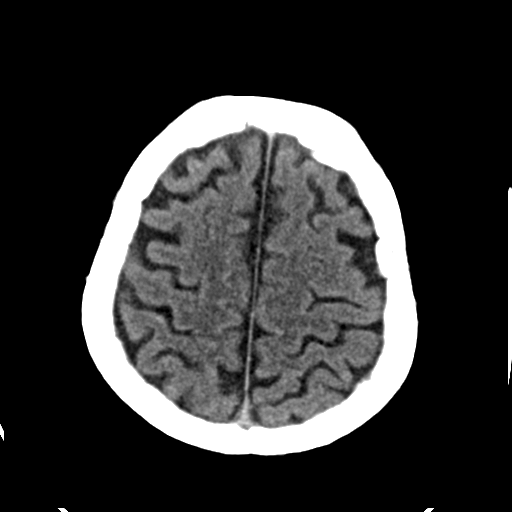
[im 26/36  bone]
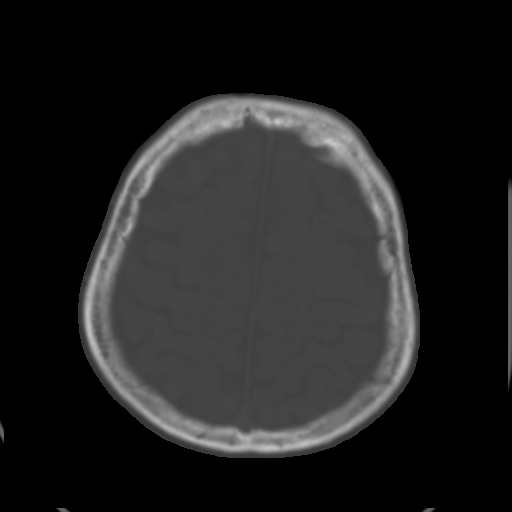
[im 31/36  brain]
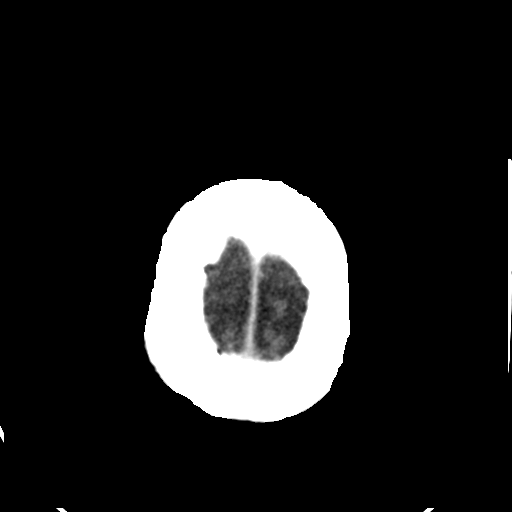

[Series 4: head bone · axial · 0.42mm/px · z∈[-148,-58]mm · 5 of 95 slices shown]
[im 9/95  bone]
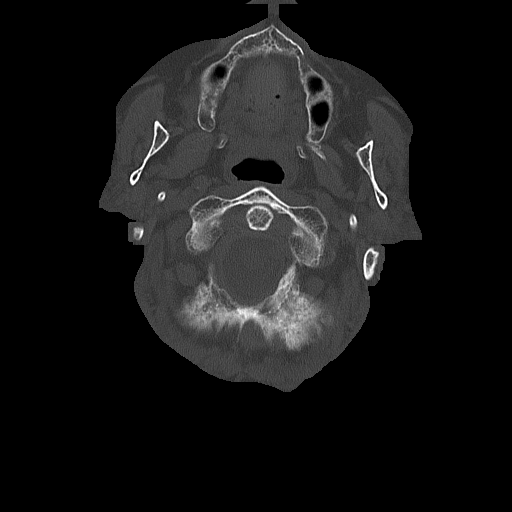
[im 18/95  bone]
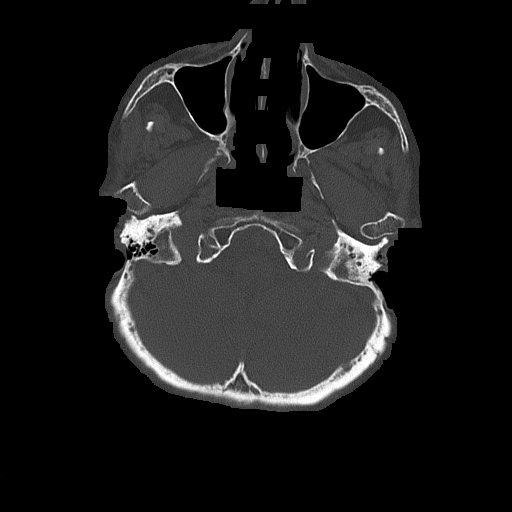
[im 32/95  bone]
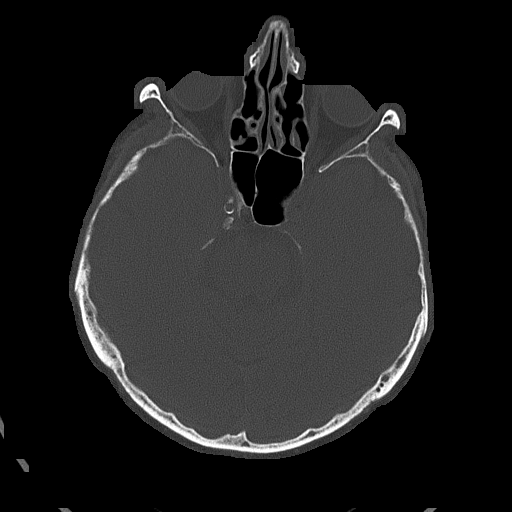
[im 41/95  bone]
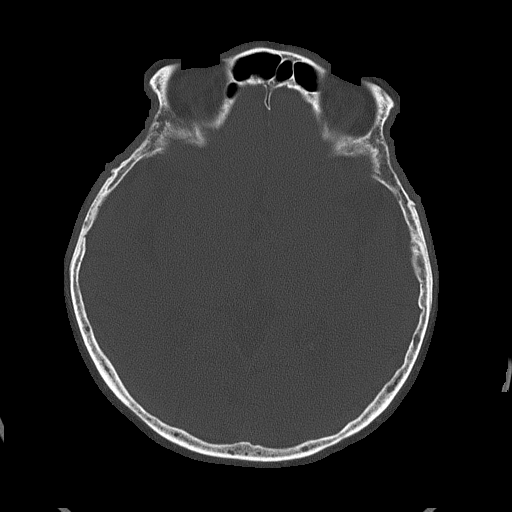
[im 54/95  bone]
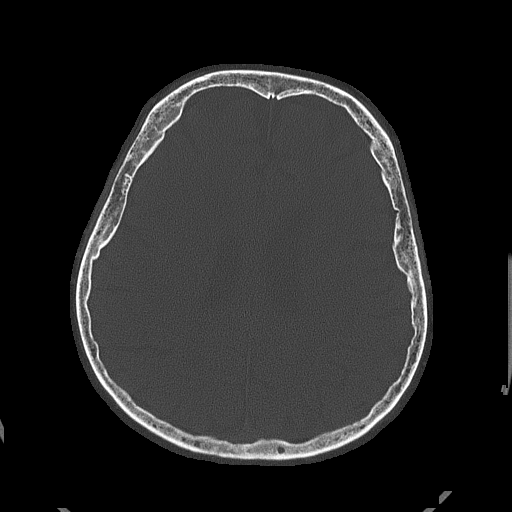

[Series 5: head without cor · coronal · non-contrast · 0.32mm/px · 3 of 67 slices shown]
[im 23/67  brain]
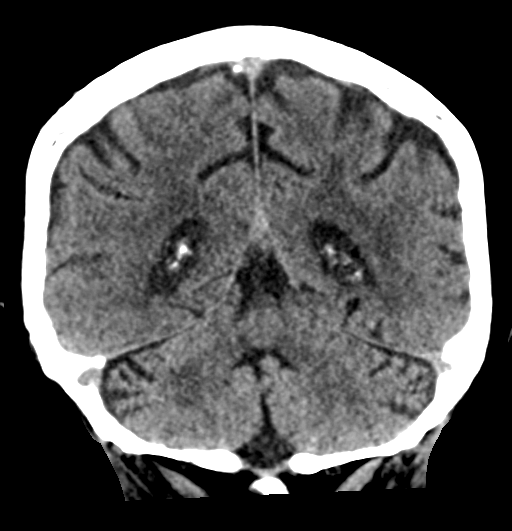
[im 30/67  brain]
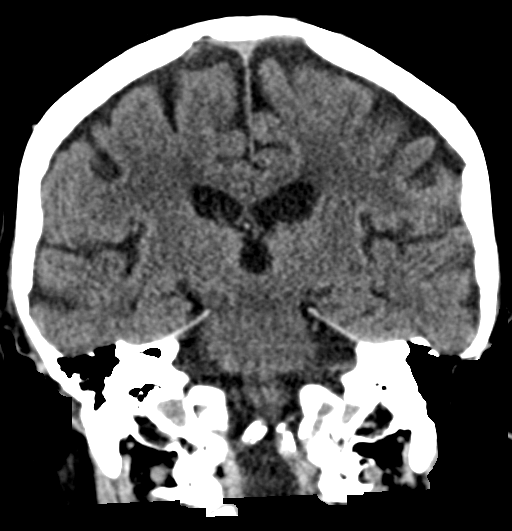
[im 37/67  brain]
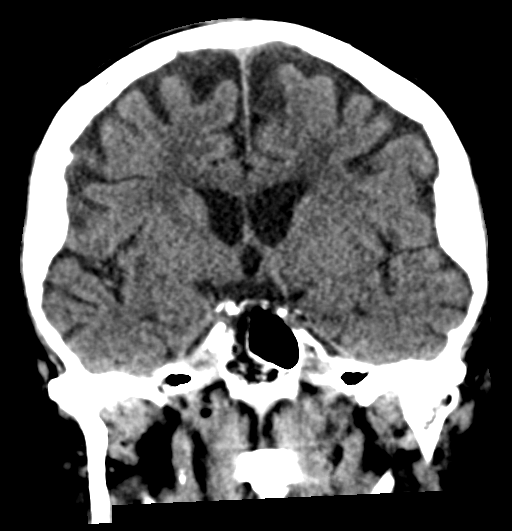

[Series 6: head without sag · sagittal · non-contrast · 0.34mm/px · 3 of 59 slices shown]
[im 20/59  brain]
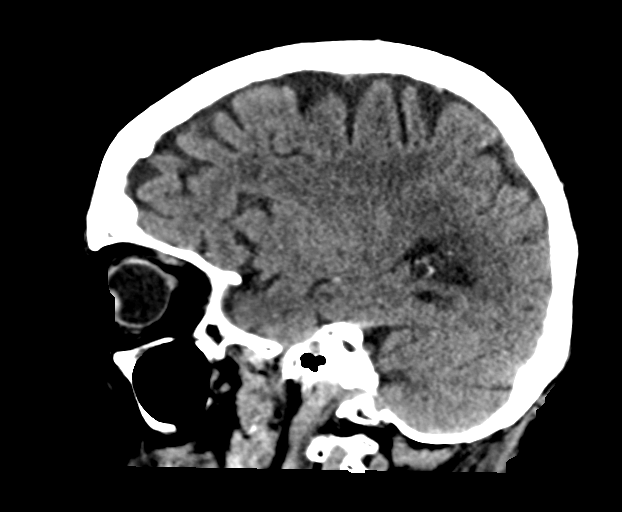
[im 30/59  brain]
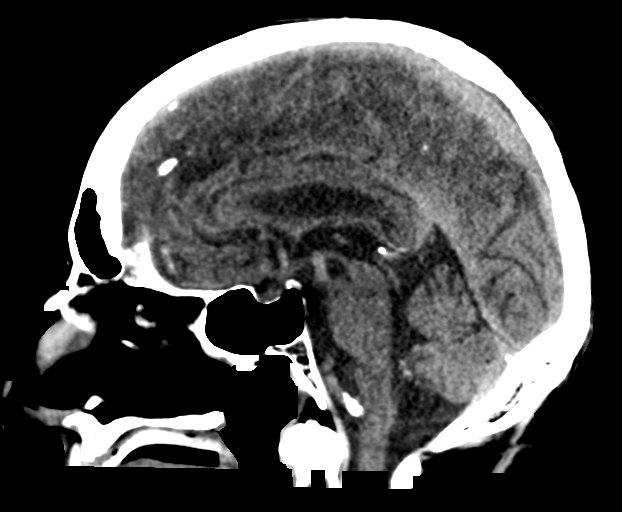
[im 39/59  brain]
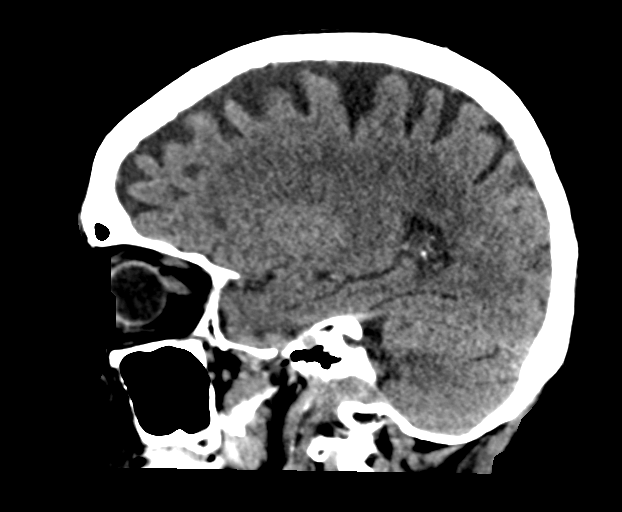

[17 of 47 positions shown; findings below may reference images not displayed]

FINDINGS: Brain: Mild atrophy unchanged. Negative for hydrocephalus. Chronic
white matter hypodensity bilaterally unchanged.

Negative for acute infarct.  Negative for hemorrhage or mass.

Vascular: Atherosclerotic calcification. Negative for hyperdense
vessel

Skull: Negative

Sinuses/Orbits: Medial antrostomy of the maxillary sinus bilaterally
with mild mucosal edema in the paranasal sinuses

Mastoidectomy bilaterally. Mastoidectomy cavity is clear
bilaterally.

Other: None
IMPRESSION: Atrophy and chronic microvascular ischemic changes in the white
matter are stable. No acute abnormality.

## 2017-12-25 ENCOUNTER — Encounter: Payer: Self-pay | Admitting: Family

## 2017-12-25 ENCOUNTER — Non-Acute Institutional Stay: Payer: Medicare Other | Admitting: Family

## 2017-12-25 DIAGNOSIS — N183 Chronic kidney disease, stage 3 (moderate): Secondary | ICD-10-CM

## 2017-12-25 DIAGNOSIS — I129 Hypertensive chronic kidney disease with stage 1 through stage 4 chronic kidney disease, or unspecified chronic kidney disease: Secondary | ICD-10-CM

## 2017-12-25 DIAGNOSIS — M159 Polyosteoarthritis, unspecified: Secondary | ICD-10-CM

## 2017-12-25 DIAGNOSIS — H6123 Impacted cerumen, bilateral: Secondary | ICD-10-CM | POA: Diagnosis not present

## 2017-12-25 DIAGNOSIS — G4700 Insomnia, unspecified: Secondary | ICD-10-CM | POA: Diagnosis not present

## 2017-12-25 DIAGNOSIS — E039 Hypothyroidism, unspecified: Secondary | ICD-10-CM | POA: Diagnosis not present

## 2017-12-25 DIAGNOSIS — F321 Major depressive disorder, single episode, moderate: Secondary | ICD-10-CM

## 2017-12-25 NOTE — Progress Notes (Signed)
Location:  [facility] Room Number: 35 Place of Service:  ALF [phone]) Provider: Tanya Crothers FNP-C   Blanchie Serve, MD  Patient Care Team: Blanchie Serve, MD as PCP - General (Internal Medicine) Dabid Godown, Nelda Bucks, NP as Nurse Practitioner (Family Medicine)  Extended Emergency Contact Information Primary Emergency Contact: Sarvis,[name] Address: [address] [place], [place] [address] [place] Mobile Phone: [phone] Relation: Son  Code Status:  DNR Goals of care: Advanced Directive information Advanced Directives 12/25/2017  Does Patient Have a Medical Advance Directive? Yes  Type of Paramedic of [place];Out of facility DNR (pink MOST or yellow form);Living will  Does patient want to make changes to medical advance directive? -  Copy of [place] in Chart? Yes  Would patient like information on creating a medical advance directive? -  Pre-existing out of facility DNR order (yellow form or pink MOST form) Yellow form placed in chart (order not valid for inpatient use);Pink MOST form placed in chart (order not valid for inpatient use)     Chief Complaint  Patient presents with  . Medical Management of Chronic Issues    HPI:  Pt is a 84 y.o. female seen today [place] for medical management of chronic diseases.she has a medical history of HTN,CKD stage 3,Hypothyroidism,OA,Osteoporosis among other conditions.she is seen in her room today with POA Mr.[name] Shelley and facility charge Nurse present at bedside.Patient denies any new acute issues during visit.she states has been able to sleep better since starting on Remeron at bedtime.Nurse states her appetite has also improved and enjoys banana -strawberry smoothie.No recent fall episodes.she has had a 2 lbs weight gain which is beneficial due to previous weight loss.      Past Medical History:  Diagnosis Date  . Acquired  cyst of kidney    left  . Allergic rhinitis, cause unspecified   . Anemia, unspecified   . Chronic hyponatremia 09/05/2015  . CKD stage 2 due to type 2 diabetes mellitus ([place]) 11/18/2014  . Closed fracture of unspecified part of vertebral column without mention of spinal cord injury    T7 s/p kyphoplasty, T12  . Edema 2015  . Herpes simplex without mention of complication   . History of Epstein-Barr virus infection   . Hyposmolality and/or hyponatremia    07/13/15 Na 131  . Insomnia   . Insomnia, unspecified   . Left cavernous carotid aneurysm 08/2008   1-1/40mm aneurysm of cavernous carotid artery  . Malignant neoplasm of breast (female), unspecified site 1990   left. Had surgerey and chemo, but no radiation  . 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 Neuropathy   . Osteoarthrosis of knee    bilaterally  . Osteoarthrosis, hip   . Osteoarthrosis, unspecified whether generalized or localized, forearm    bilaterally wrist  . Other and unspecified nonspecific immunological findings    positive ANA  . Other B-complex deficiencies   . Pain in joint, site unspecified    severe, diffuse, chronic pain  . Positive ANA (antinuclear antibody)   . Pulmonary embolism ([place]) 05/07/2015  . Pure hypercholesterolemia   . Senile osteoporosis   . Unspecified essential hypertension   . Unspecified gastritis and gastroduodenitis without mention of hemorrhage   . Unspecified hypothyroidism   . Unspecified vitamin D deficiency    Past Surgical History:  Procedure  Laterality Date  . BREAST SURGERY Left   . DILATATION & CURETTAGE/HYSTEROSCOPY WITH MYOSURE N/A 06/27/2017   Procedure: DILATATION & CURETTAGE/HYSTEROSCOPY WITH MYOSURE;  Surgeon: Servando Salina, MD;  Location: [facility];  Service: Gynecology;  Laterality: N/A;  . DILATION AND CURETTAGE OF UTERUS  X 2  . FEMUR IM NAIL Right 03/18/2015   Procedure: INTRAMEDULLARY (IM) NAIL FEMORAL;  Surgeon: Rod Can, MD;  Location: [facility];  Service: Orthopedics;  Laterality: Right;  . FRACTURE SURGERY     hip  . KYPHOPLASTY  07/03/2010   T12  . KYPHOPLASTY     C7  . LAPAROSCOPIC CHOLECYSTECTOMY  09/20/2006  . MASTECTOMY Left 04/29/1989  . MIDDLE EAR SURGERY Bilateral 1938  . NASAL SINUS SURGERY  1990 & 2000  . ORIF HIP FRACTURE Left 06/13/2007   following a syncopal episode  . TONSILLECTOMY  1968    Allergies  Allergen Reactions  . Aspirin     Drop in body temp with large quantities, can tolerate low doses of aspirin  . Penicillins Swelling    Has patient had a PCN reaction causing immediate rash, facial/tongue/throat swelling, SOB or lightheadedness with hypotension: No Has patient had a PCN reaction causing severe rash involving mucus membranes or skin necrosis: No Has patient had a PCN reaction that required hospitalization No Has patient had a PCN reaction occurring within the last 10 years: No If all of the above answers are "NO", then may proceed with Cephalosporin use.    Allergies as of 12/25/2017      Reactions   Aspirin    Drop in body temp with large quantities, can tolerate low doses of aspirin   Penicillins Swelling   Has patient had a PCN reaction causing immediate rash, facial/tongue/throat swelling, SOB or lightheadedness with hypotension: No Has patient had a PCN reaction causing severe rash involving mucus membranes or skin necrosis: No Has patient had a PCN reaction that required hospitalization No Has patient had a PCN reaction occurring within the last 10 years: No If all of the above answers are "NO", then may proceed with Cephalosporin use.      Medication List        Accurate as of 12/25/17  4:34 PM. Always use your most recent med list.          acetaminophen 500 MG tablet Commonly known as:  TYLENOL Take 1,000 mg by mouth at bedtime.   acetaminophen 500 MG tablet Commonly known as:  TYLENOL Take 500 mg by mouth daily. One tablet in am and 2  tablet in pm   acetaminophen 325 MG tablet Commonly known as:  TYLENOL Take 650 mg by mouth every 4 (four) hours as needed for fever.   atenolol 50 MG tablet Commonly known as:  TENORMIN Take 50 mg by mouth at bedtime. Hold for SBP <110 or HR <60   BIOFREEZE 4 % Gel Generic drug:  Menthol (Topical Analgesic) Apply 1 application topically 3 (three) times daily as needed.   DECUBI-VITE PO Take 1 tablet by mouth daily.   feeding supplement (PRO-STAT SUGAR FREE 64) Liqd Take 30 mLs by mouth 2 (two) times daily.   feeding supplement Liqd Take 1 Container by mouth 2 (two) times daily between meals. (1000 & 1600) Prefers Chocolate   hydrALAZINE 50 MG tablet Commonly known as:  APRESOLINE Take 100 mg by mouth as directed. 100mg  twice daily at 6AM and 8PM; 50 mg daily at [place] for SBP <110   irbesartan 300  MG tablet Commonly known as:  AVAPRO Take 300 mg by mouth daily. (0800) Hold for BP <90/60   levothyroxine 150 MCG tablet Commonly known as:  SYNTHROID, LEVOTHROID Take 150 mcg by mouth daily before breakfast. (0730)   Lidocaine 4 % Ptch Apply 3 patches topically once. Apply 1 patch to the left hip, left knee, and the right shoulder in the morning. Remove in the evening.   loratadine 10 MG tablet Commonly known as:  CLARITIN Take 1 tablet (10 mg total) by mouth daily.   mirtazapine 15 MG tablet Commonly known as:  REMERON Take 15 mg by mouth at bedtime.   ondansetron 4 MG tablet Commonly known as:  ZOFRAN Take 4 mg by mouth every 8 (eight) hours as needed for nausea or vomiting.   oxyCODONE-acetaminophen 10-325 MG tablet Commonly known as:  PERCOCET Take 1 tablet by mouth as directed. Take one tablet four times daily and one tablet at 12AM if needed   pantoprazole 40 MG tablet Commonly known as:  PROTONIX Take 40 mg by mouth daily.   polyethylene glycol packet Commonly known as:  MIRALAX / GLYCOLAX Take 17 g by mouth daily.   psyllium 58.6 % powder Commonly  known as:  METAMUCIL Take 1 packet by mouth daily. Mix in 6 oz of water or juice   ranitidine 75 MG tablet Commonly known as:  ZANTAC Take 75 mg by mouth at bedtime.   senna-docusate 8.6-50 MG tablet Commonly known as:  Senokot-S 1 tablet nightly to prevent constipation   sodium chloride 1 g tablet Take 1 g by mouth daily.   UNABLE TO FIND Med Name: Hydrocolloid dressing 4x4. Change dressing to the coccyx as needed. Check patency daily   zinc oxide 11.3 % Crea cream Commonly known as:  BALMEX Apply 1 application topically 2 (two) times daily.       Review of Systems  Constitutional: Negative for chills, fatigue and fever.       Has gained 2 lbs since previous visit   HENT: Negative for congestion, rhinorrhea, sinus pressure, sinus pain, sneezing, sore throat and trouble swallowing.   Eyes: Positive for visual disturbance. Negative for discharge, redness and itching.       Wears eye glasses   Respiratory: Negative for cough, chest tightness, shortness of breath and wheezing.   Cardiovascular: Negative for chest pain, palpitations and leg swelling.  Gastrointestinal: Negative for abdominal distention, abdominal pain, constipation, diarrhea, nausea and vomiting.  Endocrine: Negative for cold intolerance, heat intolerance, polydipsia, polyphagia and polyuria.  Genitourinary: Negative for dysuria, flank pain, frequency and urgency.  Musculoskeletal: Positive for arthralgias and gait problem.  Skin: Negative for color change, pallor and rash.  Neurological: Negative for dizziness, light-headedness and headaches.  Hematological: Does not bruise/bleed easily.  Psychiatric/Behavioral: Negative for agitation and confusion. The patient is not nervous/anxious.        Sleeping better with Remeron at bedtime     Immunization History  Administered Date(s) Administered  . Influenza Split 02/13/2013  . Influenza-Unspecified 02/27/2014, 02/12/2015, 02/25/2016, 03/08/2017  . PPD Test  09/09/2015  . Pneumococcal Conjugate-13 05/05/2016  . Pneumococcal Polysaccharide-23 02/13/2013  . Td 05/05/2016  . Zoster 05/04/2016   Pertinent  Health Maintenance Due  Topic Date Due  . MAMMOGRAM  03/21/1945  . INFLUENZA VACCINE  12/14/2017  . DEXA SCAN  Completed  . PNA vac Low Risk Adult  Completed   Fall Risk  01/02/2017 11/10/2015 10/20/2015 10/20/2015 06/30/2015  Falls in the past year? No No  Yes No No  Number falls in past yr: - - 2 or more - -  Injury with Fall? - - Yes - -  Risk Factor Category  - - - - -  Risk for fall due to : - - History of fall(s);Impaired balance/gait - -  Follow up - - - - -   Functional Status Survey:    Vitals:   12/25/17 1059  BP: (!) 162/89  Pulse: 65  Resp: 18  Temp: 98 F (36.7 C)  SpO2: 97%  Weight: 124 lb (56.2 kg)  Height: 5\' 8"  (1.727 m)   Body mass index is 18.85 kg/m. Physical Exam  Constitutional: She is oriented to person, place, and time.  Tall thin frail elderly in no acute distress   HENT:  Head: Normocephalic.  Mouth/Throat: Oropharynx is clear and moist. No oropharyngeal exudate.  TM not visualized during visit due to bilateral cerumen impaction.   Eyes: Pupils are equal, round, and reactive to light. Conjunctivae and EOM are normal. Right eye exhibits no discharge. Left eye exhibits no discharge. No scleral icterus.  Neck: Normal range of motion. No JVD present. No thyromegaly present.  Cardiovascular: Normal rate, regular rhythm, normal heart sounds and intact distal pulses. Exam reveals no gallop and no friction rub.  No murmur heard. Pulmonary/Chest: Effort normal and breath sounds normal. No respiratory distress. She has no wheezes. She has no rales.  Abdominal: Soft. Bowel sounds are normal. She exhibits no distension and no mass. There is no tenderness. There is no rebound and no guarding.  Musculoskeletal: She exhibits no edema or tenderness.  Moves x 4 extremities.unsteady gait ambulates with Rolator.Arthritic  changes noted   Lymphadenopathy:    She has no cervical adenopathy.  Neurological: She is oriented to person, place, and time. Gait abnormal.  Skin: Skin is warm and dry. No rash noted. No erythema. No pallor.  Psychiatric: She has a normal mood and affect. Her speech is normal and behavior is normal. Judgment and thought content normal. Cognition and memory are normal.  Nursing note and vitals reviewed.   Labs reviewed: Recent Labs    03/30/17 0604 03/30/17 1016 03/31/17 0557  06/27/17 1135  07/13/17  10/19/17 11/02/17 12/07/17  NA 127*  --  127*   < > 128*   < > 128*  128   < > 128 130* 130*  K 4.0  --  4.1   < > 4.2   < > 3.9  3.9   < > 3.9 4.0 3.9  CL 97*  --  97*   < > 97*  --  97  --   --  96 95  CO2 19*  --  24  --  23  --  24  --   --  27 28  GLUCOSE 114*  --  96  --  114*  --   --   --   --   --   --   BUN 21*  --  21*   < > 23*   < > 21  21   < > 39* 23* 24*  CREATININE 1.03*  --  1.15*   < > 1.06*   < > 1.0  0.98   < > 1.01 1.1 1.0  CALCIUM 8.7*  --  8.4*   < > 8.6*  --  8.2  8.2   < > 9.1 9.1 8.9  MG  --  2.1  --   --   --   --   --   --   --   --   --  PHOS  --  2.6  --   --   --   --   --   --   --   --   --    < > = values in this interval not displayed.   Recent Labs    03/31/17 0557 07/03/17 08/31/17 10/19/17  AST 19 26 20 28   ALT 13* 17 1.57 16  ALKPHOS 47 45 53 76  BILITOT 0.9  --  0.8 0.8  PROT 6.2*  --  5.5 6.7  ALBUMIN 3.3*  --  3.3 3.8   Recent Labs    03/30/17 0604 03/31/17 0557 06/27/17 1135 07/03/17 08/11/17 08/31/17 10/24/17  WBC 5.2 4.5 5.2 6.2 4.8 7 5.2  HGB 11.8* 11.0* 12.8 12.0 12.1 11.4 11.8*  HCT 33.2* 32.4* 36.9 34* 34.5 32.1 33*  MCV 95.1 97.3 97.9  --   --   --   --   PLT 143* 143* 172 188  --   --  173   Lab Results  Component Value Date   TSH 1.57 08/31/2017   No results found for: HGBA1C Lab Results  Component Value Date   CHOL 175 11/10/2014   HDL 53 11/10/2014   LDLCALC 87 11/10/2014   TRIG 173 (A) 11/10/2014     Significant Diagnostic Results in last 30 days:  No results found.  Assessment/Plan  1. Bilateral impacted cerumen TM not visualized.start on Debrox 6.5 % otic solution instil 5 drops into each ear twice daily x 4 days then facility Nurse to lavage with warm water.   2. Current moderate episode of major depressive disorder, Mood stable since started on Remeron.has been walking on facility hallways and sits outside on the breeze way at times.has gained 2 pounds since previous visit and enjoys drinking banana-strawberry smoothies with her meals.   3. Osteoarthritis of multiple joints continue to encourage exercise.continue on current pain regimen.   4. Insomnia Sleeping well with Remeron at bedtime.continue to monitor.  Family/ staff Communication: Reviewed plan of care with patient and facility Nurse   Labs/tests ordered: None   Nelda Bucks Keshun Berrett, NP

## 2017-12-27 ENCOUNTER — Encounter: Payer: Self-pay | Admitting: Internal Medicine

## 2018-01-04 DIAGNOSIS — E039 Hypothyroidism, unspecified: Secondary | ICD-10-CM | POA: Diagnosis not present

## 2018-01-04 DIAGNOSIS — D649 Anemia, unspecified: Secondary | ICD-10-CM | POA: Diagnosis not present

## 2018-01-04 DIAGNOSIS — I10 Essential (primary) hypertension: Secondary | ICD-10-CM | POA: Diagnosis not present

## 2018-01-04 LAB — BASIC METABOLIC PANEL
BUN: 31 — AB (ref 4–21)
CALCIUM: 8.8
Creat: 1.12
Glucose: 84
Potassium: 3.8
Sodium: 131

## 2018-01-04 LAB — CBC
HCT: 34.6
HEMOGLOBIN: 11.8
MCV: 98.6
WBC: 4.7
platelet count: 155

## 2018-01-04 LAB — TSH: TSH: 1.71

## 2018-01-05 ENCOUNTER — Encounter: Payer: Self-pay | Admitting: *Deleted

## 2018-01-08 ENCOUNTER — Non-Acute Institutional Stay: Payer: Medicare Other

## 2018-01-08 DIAGNOSIS — Z0000 Encounter for general adult medical examination without abnormal findings: Secondary | ICD-10-CM | POA: Diagnosis not present

## 2018-01-08 NOTE — Progress Notes (Addendum)
Subjective:   Bailey [name] is a 84 y.o. female who presents for Medicare Annual (Subsequent) preventive examination at [facility]-01/02/2017    Objective:     Vitals: BP 138/84 (BP Location: Right Arm, Patient Position: Supine)   Pulse 66   Temp 97.9 F (36.6 C) (Oral)   Ht 5\' 8"  (1.727 m)   Wt 124 lb (56.2 kg)   BMI 18.85 kg/m   Body mass index is 18.85 kg/m.  Advanced Directives 01/08/2018 12/25/2017 11/27/2017 10/19/2017 10/06/2017 08/29/2017 08/21/2017  Does Patient Have a Medical Advance Directive? Yes Yes Yes Yes Yes Yes Yes  Type of Paramedic of [place];Out of facility DNR (pink MOST or yellow form);Living will [place];Out of facility DNR (pink MOST or yellow form);Living will [place];Out of facility DNR (pink MOST or yellow form);Living will [place];Out of facility DNR (pink MOST or yellow form);Living will [place];Out of facility DNR (pink MOST or yellow form);Living will [place];Out of facility DNR (pink MOST or yellow form);Living will [place];Out of facility DNR (pink MOST or yellow form);Living will  Does patient want to make changes to medical advance directive? No - Patient declined - - - - - -  Copy of [place] in Chart? Yes Yes Yes Yes Yes Yes Yes  Would patient like information on creating a medical advance directive? - - - - - - -  Pre-existing out of facility DNR order (yellow form or pink MOST form) Yellow form placed in chart (order not valid for inpatient use);Pink MOST form placed in chart (order not valid for inpatient use) Yellow form placed in chart (order not valid for inpatient use);Pink MOST form placed in chart (order not valid for inpatient use) Yellow form placed in chart (order not valid for inpatient use);Pink MOST form placed in chart (order not valid for  inpatient use) Yellow form placed in chart (order not valid for inpatient use);Pink MOST form placed in chart (order not valid for inpatient use) Yellow form placed in chart (order not valid for inpatient use);Pink MOST form placed in chart (order not valid for inpatient use) Yellow form placed in chart (order not valid for inpatient use);Pink MOST form placed in chart (order not valid for inpatient use) Yellow form placed in chart (order not valid for inpatient use);Pink MOST form placed in chart (order not valid for inpatient use)    Tobacco Social History   Tobacco Use  Smoking Status Never Smoker  Smokeless Tobacco Never Used     Counseling given: Not Answered   Clinical Intake:  Pre-visit preparation completed: No  Pain : 0-10 Pain Score: 3  Pain Type: Chronic pain Pain Location: Knee Pain Orientation: Left, Right Pain Descriptors / Indicators: Aching Pain Onset: More than a month ago Pain Frequency: Intermittent     Nutritional Risks: None Diabetes: No  How often do you need to have someone help you when you read instructions, pamphlets, or other written materials from your doctor or pharmacy?: 2 - Rarely  Interpreter Needed?: No  Information entered by :: Tyson Dense, RN  Past Medical History:  Diagnosis Date  . Acquired cyst of kidney    left  . Allergic rhinitis, cause unspecified   . Anemia, unspecified   . Chronic hyponatremia 09/05/2015  . CKD stage 2 due to type 2 diabetes mellitus ([place]) 11/18/2014  . Closed  fracture of unspecified part of vertebral column without mention of spinal cord injury    T7 s/p kyphoplasty, T12  . Edema 2015  . Herpes simplex without mention of complication   . History of Epstein-Barr virus infection   . Hyposmolality and/or hyponatremia    07/13/15 Na 131  . Insomnia   . Insomnia, unspecified   . Left cavernous carotid aneurysm 08/2008   1-1/53mm aneurysm of cavernous carotid artery  . Malignant neoplasm of breast (female),  unspecified site 1990   left. Had surgerey and chemo, but no radiation  . 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 Neuropathy   . Osteoarthrosis of knee    bilaterally  . Osteoarthrosis, hip   . Osteoarthrosis, unspecified whether generalized or localized, forearm    bilaterally wrist  . Other and unspecified nonspecific immunological findings    positive ANA  . Other B-complex deficiencies   . Pain in joint, site unspecified    severe, diffuse, chronic pain  . Positive ANA (antinuclear antibody)   . Pulmonary embolism ([place]) 05/07/2015  . Pure hypercholesterolemia   . Senile osteoporosis   . Unspecified essential hypertension   . Unspecified gastritis and gastroduodenitis without mention of hemorrhage   . Unspecified hypothyroidism   . Unspecified vitamin D deficiency    Past Surgical History:  Procedure Laterality Date  . BREAST SURGERY Left   . DILATATION & CURETTAGE/HYSTEROSCOPY WITH MYOSURE N/A 06/27/2017   Procedure: DILATATION & CURETTAGE/HYSTEROSCOPY WITH MYOSURE;  Surgeon: Servando Salina, MD;  Location: [facility];  Service: Gynecology;  Laterality: N/A;  . DILATION AND CURETTAGE OF UTERUS  X 2  . FEMUR IM NAIL Right 03/18/2015   Procedure: INTRAMEDULLARY (IM) NAIL FEMORAL;  Surgeon: Rod Can, MD;  Location: [facility];  Service: Orthopedics;  Laterality: Right;  . FRACTURE SURGERY     hip  . KYPHOPLASTY  07/03/2010   T12  . KYPHOPLASTY     C7  . LAPAROSCOPIC CHOLECYSTECTOMY  09/20/2006  . MASTECTOMY Left 04/29/1989  . MIDDLE EAR SURGERY Bilateral 1938  . NASAL SINUS SURGERY  1990 & 2000  . ORIF HIP FRACTURE Left 06/13/2007   following a syncopal episode  . TONSILLECTOMY  1968   History reviewed. No pertinent family history. Social History   Socioeconomic History  . Marital status: Widowed    Spouse name: Not on file  . Number of children: Not on file  . Years of education: Not on file  . Highest education level:  Not on file  Occupational History  . Occupation: retired Marine scientist  Social Needs  . Financial resource strain: Not hard at all  . Food insecurity:    Worry: Never true    Inability: Never true  . Transportation needs:    Medical: No    Non-medical: No  Tobacco Use  . Smoking status: Never Smoker  . Smokeless tobacco: Never Used  Substance and Sexual Activity  . Alcohol use: Yes    Comment: 05/08/2015 "I'll have a glass of white wine q now and then"  . Drug use: No  . Sexual activity: Never  Lifestyle  . Physical activity:    Days per week: 7 days    Minutes per session: 10 min  . Stress: Only a little  Relationships  . Social connections:    Talks on phone: Three times a week    Gets together: Three times a week    Attends religious service: Never  Active member of club or organization: No    Attends meetings of clubs or organizations: Never    Relationship status: Widowed  Other Topics Concern  . Not on file  Social History Narrative   L[facility] at [facility] since 10/16/2013, moved from [place]    Married 1968   Never smoked   No alcohol    Exercise walking, walks with cane   POA/Living Will             Outpatient Encounter Medications as of 01/08/2018  Medication Sig  . acetaminophen (TYLENOL) 325 MG tablet Take 650 mg by mouth every 4 (four) hours as needed for fever.  Marland Kitchen acetaminophen (TYLENOL) 500 MG tablet Take 1,000 mg by mouth at bedtime.   Marland Kitchen acetaminophen (TYLENOL) 500 MG tablet Take 500 mg by mouth daily. One tablet in am and 2 tablet in pm  . Amino Acids-Protein Hydrolys (FEEDING SUPPLEMENT, PRO-STAT SUGAR FREE 64,) LIQD Take 30 mLs by mouth 2 (two) times daily.   Marland Kitchen atenolol (TENORMIN) 50 MG tablet Take 50 mg by mouth at bedtime. Hold for SBP <110 or HR <60   . feeding supplement (BOOST / RESOURCE BREEZE) LIQD Take 1 Container by mouth 2 (two) times daily between meals. (1000 & 1600) Prefers Chocolate  . hydrALAZINE (APRESOLINE) 50 MG tablet Take 100 mg by mouth as  directed. 100mg  twice daily at 6AM and 8PM; 50 mg daily at [place] for SBP <110  . irbesartan (AVAPRO) 300 MG tablet Take 300 mg by mouth daily. (0800) Hold for BP <90/60  . levothyroxine (SYNTHROID, LEVOTHROID) 150 MCG tablet Take 150 mcg by mouth daily before breakfast. (0730)  . Lidocaine (ASPERCREME LIDOCAINE) 4 % PTCH Apply 3 patches topically once. Apply 1 patch to the left hip, left knee, and the right shoulder in the morning. Remove in the evening.  . loratadine (CLARITIN) 10 MG tablet Take 1 tablet (10 mg total) by mouth daily.  . Menthol, Topical Analgesic, (BIOFREEZE) 4 % GEL Apply 1 application topically 3 (three) times daily as needed.  . mirtazapine (REMERON) 15 MG tablet Take 15 mg by mouth at bedtime.  . Multiple Vitamins-Minerals (DECUBI-VITE PO) Take 1 tablet by mouth daily.  . ondansetron (ZOFRAN) 4 MG tablet Take 4 mg by mouth every 8 (eight) hours as needed for nausea or vomiting.  Marland Kitchen oxyCODONE-acetaminophen (PERCOCET) 10-325 MG tablet Take 1 tablet by mouth as directed. Take one tablet four times daily and one tablet at 12AM if needed  . pantoprazole (PROTONIX) 40 MG tablet Take 40 mg by mouth daily.  . polyethylene glycol (MIRALAX / GLYCOLAX) packet Take 17 g by mouth daily.  . psyllium (METAMUCIL) 58.6 % powder Take 1 packet by mouth daily. Mix in 6 oz of water or juice  . ranitidine (ZANTAC) 75 MG tablet Take 75 mg by mouth at bedtime.   . senna-docusate (SENOKOT S) 8.6-50 MG tablet 1 tablet nightly to prevent constipation  . sodium chloride 1 g tablet Take 1 g by mouth daily.  Marland Kitchen UNABLE TO FIND Med Name: Hydrocolloid dressing 4x4. Change dressing to the coccyx as needed. Check patency daily  . zinc oxide (BALMEX) 11.3 % CREA cream Apply 1 application topically 2 (two) times daily.   No facility-administered encounter medications on file as of 01/08/2018.     Activities of Daily Living In your present state of health, do you have any difficulty performing the following  activities: 01/08/2018 06/27/2017  Hearing? N N  Vision? N N  Difficulty concentrating or making decisions? N Y  Walking or climbing stairs? Y N  Comment - walker at [facility], today patient is in W/C  Dressing or bathing? N Y  Comment - has help with showers  Doing errands, shopping? Y -  Conservation officer, nature and eating ? Y -  Using the Toilet? N -  In the past six months, have you accidently leaked urine? N -  Do you have problems with loss of bowel control? N -  Managing your Medications? Y -  Managing your Finances? Y -  Housekeeping or managing your Housekeeping? Y -  Some recent data might be hidden    Patient Care Team: Blanchie Serve, MD as PCP - General (Internal Medicine) Ngetich, Nelda Bucks, NP as Nurse Practitioner (Family Medicine)    Assessment:   This is a routine wellness examination for [place].  Exercise Activities and Dietary recommendations Current Exercise Habits: Home exercise routine, Type of exercise: stretching, Time (Minutes): 10, Frequency (Times/Week): 7, Weekly Exercise (Minutes/Week): 70, Intensity: Mild, Exercise limited by: orthopedic condition(s)  Goals   None     Fall Risk Fall Risk  01/08/2018 01/02/2017 11/10/2015 10/20/2015 10/20/2015  Falls in the past year? No No No Yes No  Number falls in past yr: - - - 2 or more -  Injury with Fall? - - - Yes -  Risk Factor Category  - - - - -  Risk for fall due to : - - - History of fall(s);Impaired balance/gait -  Follow up - - - - -   Is the patient's home free of loose throw rugs in walkways, pet beds, electrical cords, etc?   yes      Grab bars in the bathroom? yes      Handrails on the stairs?   yes      Adequate lighting?   yes  Depression Screen PHQ 2/9 Scores 01/08/2018 01/02/2017 10/20/2015 03/30/2015  PHQ - 2 Score 0 0 0 0     Cognitive Function MMSE - Mini Mental State Exam 01/08/2018 01/02/2017  Orientation to time 4 4  Orientation to Place 5 5  Registration 3 3  Attention/ Calculation 5 5    Recall 3 3  Language- name 2 objects 2 2  Language- repeat 1 1  Language- follow 3 step command 3 3  Language- read & follow direction 1 1  Write a sentence 1 1  Copy design 1 1  Total score 29 29        Immunization History  Administered Date(s) Administered  . Influenza Split 02/13/2013  . Influenza-Unspecified 02/27/2014, 02/12/2015, 02/25/2016, 03/08/2017  . PPD Test 09/09/2015  . Pneumococcal Conjugate-13 05/05/2016  . Pneumococcal Polysaccharide-23 02/13/2013  . Td 05/05/2016  . Zoster 05/04/2016    Qualifies for Shingles Vaccine? Not in past records  Screening Tests Health Maintenance  Topic Date Due  . MAMMOGRAM  03/21/1945  . INFLUENZA VACCINE  12/14/2017  . TETANUS/TDAP  05/05/2026  . DEXA SCAN  Completed  . PNA vac Low Risk Adult  Completed    Cancer Screenings: Lung: Low Dose CT Chest recommended if Age 65-80 years, 30 pack-year currently smoking OR have quit w/in 15years. Patient does not qualify. Breast:  Up to date on Mammogram? Yes   Up to date of Bone Density/Dexa? Yes Colorectal: up to date  Additional Screenings:  Hepatitis C Screening: declined     Plan:    I have personally reviewed and addressed the Medicare Annual Wellness questionnaire  and have noted the following in the patient's chart:  A. Medical and social history B. Use of alcohol, tobacco or illicit drugs  C. Current medications and supplements D. Functional ability and status E.  Nutritional status F.  Physical activity G. Advance directives H. List of other physicians I.  Hospitalizations, surgeries, and ER visits in previous 12 months J.  [place] to include hearing, vision, cognitive, depression L. Referrals and appointments - none  In addition, I have reviewed and discussed with patient certain preventive protocols, quality metrics, and best practice recommendations. A written personalized care plan for preventive services as well as general preventive health  recommendations were provided to patient.  See attached scanned questionnaire for additional information.   Signed,   Tyson Dense, RN Nurse Health Advisor  Patient Concerns: Feels like she is on too many medications. It hurts her stomach to take them together     I agree with above and was available to answer medical question/ concerns from [place].  Blanchie Serve, MD  [facility] [phone] (Monday-Friday 8 am - 5 pm) [phone] (afterhours)

## 2018-01-08 NOTE — Patient Instructions (Signed)
Bailey [name] , Thank you for taking time to come for your Medicare Wellness Visit. I appreciate your ongoing commitment to your health goals. Please review the following plan we discussed and let me know if I can assist you in the future.   Screening recommendations/referrals: Colonoscopy excluded, over age 82 Mammogram excluded, over age 38 Bone Density up to date Recommended yearly ophthalmology/optometry visit for glaucoma screening and checkup Recommended yearly dental visit for hygiene and checkup  Vaccinations: Influenza vaccine due, will get at [facility] Pneumococcal vaccine up to date, completed Tdap vaccine up to date Shingles vaccine not in past records    Advanced directives: in chart  Conditions/risks identified: none  Next appointment: Dr. Bubba Camp makes rounds   Preventive Care 81 Years and Older, Female Preventive care refers to lifestyle choices and visits with your health care provider that can promote health and wellness. What does preventive care include?  A yearly physical exam. This is also called an annual well check.  Dental exams once or twice a year.  Routine eye exams. Ask your health care provider how often you should have your eyes checked.  Personal lifestyle choices, including:  Daily care of your teeth and gums.  Regular physical activity.  Eating a healthy diet.  Avoiding tobacco and drug use.  Limiting alcohol use.  Practicing safe sex.  Taking low-dose aspirin every day.  Taking vitamin and mineral supplements as recommended by your health care provider. What happens during an annual well check? The services and screenings done by your health care provider during your annual well check will depend on your age, overall health, lifestyle risk factors, and family history of disease. Counseling  Your health care provider may ask you questions about your:  Alcohol use.  Tobacco use.  Drug use.  Emotional well-being.  Home and  relationship well-being.  Sexual activity.  Eating habits.  History of falls.  Memory and ability to understand (cognition).  Work and work Statistician.  Reproductive health. Screening  You may have the following tests or measurements:  Height, weight, and BMI.  Blood pressure.  Lipid and cholesterol levels. These may be checked every 5 years, or more frequently if you are over 42 years old.  Skin check.  Lung cancer screening. You may have this screening every year starting at age 62 if you have a 30-pack-year history of smoking and currently smoke or have quit within the past 15 years.  Fecal occult blood test (FOBT) of the stool. You may have this test every year starting at age 55.  Flexible sigmoidoscopy or colonoscopy. You may have a sigmoidoscopy every 5 years or a colonoscopy every 10 years starting at age 37.  Hepatitis C blood test.  Hepatitis B blood test.  Sexually transmitted disease (STD) testing.  Diabetes screening. This is done by checking your blood sugar (glucose) after you have not eaten for a while (fasting). You may have this done every 1-3 years.  Bone density scan. This is done to screen for osteoporosis. You may have this done starting at age 39.  Mammogram. This may be done every 1-2 years. Talk to your health care provider about how often you should have regular mammograms. Talk with your health care provider about your test results, treatment options, and if necessary, the need for more tests. Vaccines  Your health care provider may recommend certain vaccines, such as:  Influenza vaccine. This is recommended every year.  Tetanus, diphtheria, and acellular pertussis (Tdap, Td) vaccine. You may  need a Td booster every 10 years.  Zoster vaccine. You may need this after age 93.  Pneumococcal 13-valent conjugate (PCV13) vaccine. One dose is recommended after age 18.  Pneumococcal polysaccharide (PPSV23) vaccine. One dose is recommended after  age 59. Talk to your health care provider about which screenings and vaccines you need and how often you need them. This information is not intended to replace advice given to you by your health care provider. Make sure you discuss any questions you have with your health care provider. Document Released: 05/29/2015 Document Revised: 01/20/2016 Document Reviewed: 03/03/2015 Elsevier Interactive Patient Education  2017 [place] Prevention in the Home Falls can cause injuries. They can happen to people of all ages. There are many things you can do to make your home safe and to help prevent falls. What can I do on the outside of my home?  Regularly fix the edges of walkways and driveways and fix any cracks.  Remove anything that might make you trip as you walk through a door, such as a raised step or threshold.  Trim any bushes or trees on the path to your home.  Use bright outdoor lighting.  Clear any walking paths of anything that might make someone trip, such as rocks or tools.  Regularly check to see if handrails are loose or broken. Make sure that both sides of any steps have handrails.  Any raised decks and porches should have guardrails on the edges.  Have any leaves, snow, or ice cleared regularly.  Use sand or salt on walking paths during winter.  Clean up any spills in your garage right away. This includes oil or grease spills. What can I do in the bathroom?  Use night lights.  Install grab bars by the toilet and in the tub and shower. Do not use towel bars as grab bars.  Use non-skid mats or decals in the tub or shower.  If you need to sit down in the shower, use a plastic, non-slip stool.  Keep the floor dry. Clean up any water that spills on the floor as soon as it happens.  Remove soap buildup in the tub or shower regularly.  Attach bath mats securely with double-sided non-slip rug tape.  Do not have throw rugs and other things on the floor that can  make you trip. What can I do in the bedroom?  Use night lights.  Make sure that you have a light by your bed that is easy to reach.  Do not use any sheets or blankets that are too big for your bed. They should not hang down onto the floor.  Have a firm chair that has side arms. You can use this for support while you get dressed.  Do not have throw rugs and other things on the floor that can make you trip. What can I do in the kitchen?  Clean up any spills right away.  Avoid walking on wet floors.  Keep items that you use a lot in easy-to-reach places.  If you need to reach something above you, use a strong step stool that has a grab bar.  Keep electrical cords out of the way.  Do not use floor polish or wax that makes floors slippery. If you must use wax, use non-skid floor wax.  Do not have throw rugs and other things on the floor that can make you trip. What can I do with my stairs?  Do not leave any items on  the stairs.  Make sure that there are handrails on both sides of the stairs and use them. Fix handrails that are broken or loose. Make sure that handrails are as long as the stairways.  Check any carpeting to make sure that it is firmly attached to the stairs. Fix any carpet that is loose or worn.  Avoid having throw rugs at the top or bottom of the stairs. If you do have throw rugs, attach them to the floor with carpet tape.  Make sure that you have a light switch at the top of the stairs and the bottom of the stairs. If you do not have them, ask someone to add them for you. What else can I do to help prevent falls?  Wear shoes that:  Do not have high heels.  Have rubber bottoms.  Are comfortable and fit you well.  Are closed at the toe. Do not wear sandals.  If you use a stepladder:  Make sure that it is fully opened. Do not climb a closed stepladder.  Make sure that both sides of the stepladder are locked into place.  Ask someone to hold it for you,  if possible.  Clearly mark and make sure that you can see:  Any grab bars or handrails.  First and last steps.  Where the edge of each step is.  Use tools that help you move around (mobility aids) if they are needed. These include:  Canes.  Walkers.  Scooters.  Crutches.  Turn on the lights when you go into a dark area. Replace any light bulbs as soon as they burn out.  Set up your furniture so you have a clear path. Avoid moving your furniture around.  If any of your floors are uneven, fix them.  If there are any pets around you, be aware of where they are.  Review your medicines with your doctor. Some medicines can make you feel dizzy. This can increase your chance of falling. Ask your doctor what other things that you can do to help prevent falls. This information is not intended to replace advice given to you by your health care provider. Make sure you discuss any questions you have with your health care provider. Document Released: 02/26/2009 Document Revised: 10/08/2015 Document Reviewed: 06/06/2014 Elsevier Interactive Patient Education  2017 [employer].

## 2018-01-15 ENCOUNTER — Non-Acute Institutional Stay: Payer: Medicare Other | Admitting: Internal Medicine

## 2018-01-15 DIAGNOSIS — F418 Other specified anxiety disorders: Secondary | ICD-10-CM | POA: Insufficient documentation

## 2018-01-15 DIAGNOSIS — N183 Chronic kidney disease, stage 3 unspecified: Secondary | ICD-10-CM

## 2018-01-15 DIAGNOSIS — E440 Moderate protein-calorie malnutrition: Secondary | ICD-10-CM

## 2018-01-15 DIAGNOSIS — I129 Hypertensive chronic kidney disease with stage 1 through stage 4 chronic kidney disease, or unspecified chronic kidney disease: Secondary | ICD-10-CM | POA: Diagnosis not present

## 2018-01-15 DIAGNOSIS — M1990 Unspecified osteoarthritis, unspecified site: Secondary | ICD-10-CM | POA: Diagnosis not present

## 2018-01-15 DIAGNOSIS — F339 Major depressive disorder, recurrent, unspecified: Secondary | ICD-10-CM | POA: Insufficient documentation

## 2018-01-15 NOTE — Progress Notes (Signed)
Location:  [facility] Service:  ALF [id number]) Provider:  Blanchie Serve, MD  Blanchie Serve, MD  Patient Care Team: Blanchie Serve, MD as PCP - General (Internal Medicine) Ngetich, Nelda Bucks, NP as Nurse Practitioner (Family Medicine)  Extended Emergency Contact Information Primary Emergency Contact: Byas,[name] Address: [address] [place], [place] [address] [place] Mobile Phone: [phone] Relation: Son  Code Status:  DNR  Goals of care: Advanced Directive information Advanced Directives 01/08/2018  Does Patient Have a Medical Advance Directive? Yes  Type of Paramedic of [place];Out of facility DNR (pink MOST or yellow form);Living will  Does patient want to make changes to medical advance directive? No - Patient declined  Copy of [place] in Chart? Yes  Would patient like information on creating a medical advance directive? -  Pre-existing out of facility DNR order (yellow form or pink MOST form) Yellow form placed in chart (order not valid for inpatient use);Pink MOST form placed in chart (order not valid for inpatient use)     Chief Complaint  Patient presents with  . Acute Visit    elevated BP reading, anxiety, pain    HPI:  Pt is a 84 y.o. female seen today for an acute visit for elevated BP reading. Reviewed facility readings of blood pressure. SBP 160-182 with one reading of 200/95. She has chronic pain from DJD and is on scheduled and prn tylenol and percocet. Reviewed her blood pressure regimen of atenolol, ARB and hydralazine. She has been taking remeron for her mood and anxiety. Her appetite has improved some and she is noticed to have improved mood per nursing. This has also helped her rest better at night. She feels low at times and would like to know if adjusting the dosing would help her mood better.   Past Medical History:  Diagnosis Date  . Acquired cyst of  kidney    left  . Allergic rhinitis, cause unspecified   . Anemia, unspecified   . Chronic hyponatremia 09/05/2015  . CKD stage 2 due to type 2 diabetes mellitus ([place]) 11/18/2014  . Closed fracture of unspecified part of vertebral column without mention of spinal cord injury    T7 s/p kyphoplasty, T12  . Edema 2015  . Herpes simplex without mention of complication   . History of Epstein-Barr virus infection   . Hyposmolality and/or hyponatremia    07/13/15 Na 131  . Insomnia   . Insomnia, unspecified   . Left cavernous carotid aneurysm 08/2008   1-1/14m aneurysm of cavernous carotid artery  . Malignant neoplasm of breast (female), unspecified site 1990   left. Had surgerey and chemo, but no radiation  . 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 Neuropathy   . Osteoarthrosis of knee    bilaterally  . Osteoarthrosis, hip   . Osteoarthrosis, unspecified whether generalized or localized, forearm    bilaterally wrist  . Other and unspecified nonspecific immunological findings    positive ANA  . Other B-complex deficiencies   . Pain in joint, site unspecified    severe, diffuse, chronic pain  . Positive ANA (antinuclear antibody)   . [place]monary embolism ([place] 05/07/2015  . Pure hypercholesterolemia   . Senile osteoporosis   . Unspecified essential hypertension   . Unspecified gastritis and gastroduodenitis without mention of hemorrhage   . Unspecified  hypothyroidism   . Unspecified vitamin D deficiency    Past Surgical History:  Procedure Laterality Date  . BREAST SURGERY Left   . DILATATION & CURETTAGE/HYSTEROSCOPY WITH MYOSURE N/A 06/27/2017   Procedure: DILATATION & CURETTAGE/HYSTEROSCOPY WITH MYOSURE;  Surgeon: Servando Salina, MD;  Location: [facility];  Service: Gynecology;  Laterality: N/A;  . DILATION AND CURETTAGE OF UTERUS  X 2  . FEMUR IM NAIL Right 03/18/2015   Procedure: INTRAMEDULLARY (IM) NAIL FEMORAL;  Surgeon: Rod Can, MD;  Location: [facility];  Service: Orthopedics;  Laterality: Right;  . FRACTURE SURGERY     hip  . KYPHOPLASTY  07/03/2010   T12  . KYPHOPLASTY     C7  . LAPAROSCOPIC CHOLECYSTECTOMY  09/20/2006  . MASTECTOMY Left 04/29/1989  . MIDDLE EAR SURGERY Bilateral 1938  . NASAL SINUS SURGERY  1990 & 2000  . ORIF HIP FRACTURE Left 06/13/2007   following a syncopal episode  . TONSILLECTOMY  1968    Allergies  Allergen Reactions  . Aspirin     Drop in body temp with large quantities, can tolerate low doses of aspirin  . Penicillins Swelling    Has patient had a PCN reaction causing immediate rash, facial/tongue/throat swelling, SOB or lightheadedness with hypotension: No Has patient had a PCN reaction causing severe rash involving mucus membranes or skin necrosis: No Has patient had a PCN reaction that required hospitalization No Has patient had a PCN reaction occurring within the last 10 years: No If all of the above answers are "NO", then may proceed with Cephalosporin use.    Outpatient Encounter Medications as of 01/15/2018  Medication Sig  . acetaminophen (TYLENOL) 325 MG tablet Take 650 mg by mouth every 4 (four) hours as needed for fever.  Marland Kitchen acetaminophen (TYLENOL) 500 MG tablet Take 1,000 mg by mouth at bedtime.   Marland Kitchen acetaminophen (TYLENOL) 500 MG tablet Take 500 mg by mouth daily. One tablet in am and 2 tablet in pm  . Amino Acids-Protein Hydrolys (FEEDING SUPPLEMENT, PRO-STAT SUGAR FREE 64,) LIQD Take 30 mLs by mouth 2 (two) times daily.   Marland Kitchen atenolol (TENORMIN) 50 MG tablet Take 50 mg by mouth at bedtime. Hold for SBP <110 or HR <60   . feeding supplement (BOOST / RESOURCE BREEZE) LIQD Take 1 Container by mouth 2 (two) times daily between meals. (1000 & 1600) Prefers Chocolate  . hydrALAZINE (APRESOLINE) 50 MG tablet Take 100 mg by mouth as directed. 132m twice daily at 6AM and 8PM; 50 mg daily at 2[place] SBP <110  . irbesartan (AVAPRO) 300 MG tablet Take 300 mg by mouth  daily. (0800) Hold for BP <90/60  . levothyroxine (SYNTHROID, LEVOTHROID) 150 MCG tablet Take 150 mcg by mouth daily before breakfast. (0730)  . Lidocaine (ASPERCREME LIDOCAINE) 4 % PTCH Apply 3 patches topically once. Apply 1 patch to the left hip, left knee, and the right shoulder in the morning. Remove in the evening.  . loratadine (CLARITIN) 10 MG tablet Take 1 tablet (10 mg total) by mouth daily.  . Menthol, Topical Analgesic, (BIOFREEZE) 4 % GEL Apply 1 application topically 3 (three) times daily as needed.  . mirtazapine (REMERON) 15 MG tablet Take 15 mg by mouth at bedtime.  . Multiple Vitamins-Minerals (DECUBI-VITE PO) Take 1 tablet by mouth daily.  . ondansetron (ZOFRAN) 4 MG tablet Take 4 mg by mouth every 8 (eight) hours as needed for nausea or vomiting.  .Marland KitchenoxyCODONE-acetaminophen (PERCOCET) 10-325 MG tablet Take 1 tablet  by mouth as directed. Take one tablet four times daily and one tablet at 12AM if needed  . pantoprazole (PROTONIX) 40 MG tablet Take 40 mg by mouth daily.  . polyethylene glycol (MIRALAX / GLYCOLAX) packet Take 17 g by mouth daily.  . psyllium (METAMUCIL) 58.6 % powder Take 1 packet by mouth daily. Mix in 6 oz of water or juice  . ranitidine (ZANTAC) 75 MG tablet Take 75 mg by mouth at bedtime.   . senna-docusate (SENOKOT S) 8.6-50 MG tablet 1 tablet nightly to prevent constipation  . sodium chloride 1 g tablet Take 1 g by mouth daily.  Marland Kitchen UNABLE TO FIND Med Name: Hydrocolloid dressing 4x4. Change dressing to the coccyx as needed. Check patency daily  . zinc oxide (BALMEX) 11.3 % CREA cream Apply 1 application topically 2 (two) times daily.   No facility-administered encounter medications on file as of 01/15/2018.     Review of Systems  Constitutional: Negative for chills and fever.  HENT: Positive for hearing loss and postnasal drip. Negative for congestion and mouth sores.   Eyes: Negative for visual disturbance.  Respiratory: Negative for cough and shortness of  breath.   Cardiovascular: Negative for chest pain and palpitations.  Gastrointestinal: Negative for abdominal pain, diarrhea and vomiting.       Occasional nausea  Genitourinary: Negative for dysuria and hematuria.  Musculoskeletal: Positive for arthralgias and gait problem.  Skin: Negative for rash.  Neurological: Negative for dizziness, light-headedness, numbness and headaches.  Psychiatric/Behavioral: Positive for decreased concentration and sleep disturbance. The patient is nervous/anxious.     Immunization History  Administered Date(s) Administered  . Influenza Split 02/13/2013  . Influenza-Unspecified 02/27/2014, 02/12/2015, 02/25/2016, 03/08/2017  . PPD Test 09/09/2015  . Pneumococcal Conjugate-13 05/05/2016  . Pneumococcal Polysaccharide-23 02/13/2013  . Td 05/05/2016  . Zoster 05/04/2016   Pertinent  Health Maintenance Due  Topic Date Due  . MAMMOGRAM  03/21/1945  . INFLUENZA VACCINE  12/14/2017  . DEXA SCAN  Completed  . PNA vac Low Risk Adult  Completed   Fall Risk  01/08/2018 01/02/2017 11/10/2015 10/20/2015 10/20/2015  Falls in the past year? No No No Yes No  Number falls in past yr: - - - 2 or more -  Injury with Fall? - - - Yes -  Risk Factor Category  - - - - -  Risk for fall due to : - - - History of fall(s);Impaired balance/gait -  Follow up - - - - -   Functional Status Survey:    Vitals:   01/15/18 1005  BP: (!) 160/92  Pulse: 62  Resp: 16  Temp: 98.3 F (36.8 C)  SpO2: 93%  Weight: 120 lb 12.8 oz (54.8 kg)  Height: '5\' 8"'  (1.727 m)   Body mass index is 18.37 kg/m.   Wt Readings from Last 3 Encounters:  01/15/18 120 lb 12.8 oz (54.8 kg)  01/08/18 124 lb (56.2 kg)  12/25/17 124 lb (56.2 kg)   Physical Exam  Constitutional: She is oriented to person, place, and time. No distress.  Thin built and frail elderly female  HENT:  Head: Normocephalic and atraumatic.  Mouth/Throat: Oropharynx is clear and moist.  Eyes: Conjunctivae and EOM are normal.   Neck: Normal range of motion. Neck supple.  Cardiovascular: Normal rate and regular rhythm.  Pulmonary/Chest: Effort normal and breath sounds normal. She has no wheezes. She has no rales.  Abdominal: Soft. Bowel sounds are normal. There is no tenderness.  Musculoskeletal:  Unsteady  gait, ROM limited with DJD  Neurological: She is alert and oriented to person, place, and time.  Skin: Skin is warm and dry.  Psychiatric:  Somewhat flat affect but interacts and answers questions appropriately, slow with answers    Labs reviewed: Recent Labs    03/30/17 0604 03/30/17 1016 03/31/17 0557  06/27/17 1135  07/13/17  11/02/17 12/07/17 01/04/18  NA 127*  --  127*   < > 128*   < > 128*  128   < > 130* 130* 131  K 4.0  --  4.1   < > 4.2   < > 3.9  3.9   < > 4.0 3.9 3.8  CL 97*  --  97*   < > 97*  --  97  --  96 95  --   CO2 19*  --  24  --  23  --  24  --  27 28  --   GLUCOSE 114*  --  96  --  114*  --   --   --   --   --   --   BUN 21*  --  21*   < > 23*   < > 21  21   < > 23* 24* 31*  CREATININE 1.03*  --  1.15*   < > 1.06*   < > 1.0  0.98   < > 1.1 1.0 1.12  CALCIUM 8.7*  --  8.4*   < > 8.6*  --  8.2  8.2   < > 9.1 8.9 8.8  MG  --  2.1  --   --   --   --   --   --   --   --   --   PHOS  --  2.6  --   --   --   --   --   --   --   --   --    < > = values in this interval not displayed.   Recent Labs    03/31/17 0557 07/03/17 08/31/17 10/19/17  AST '19 26 20 28  ' ALT 13* 17 1.57 16  ALKPHOS 47 45 53 76  BILITOT 0.9  --  0.8 0.8  PROT 6.2*  --  5.5 6.7  ALBUMIN 3.3*  --  3.3 3.8   Recent Labs    03/31/17 0557 06/27/17 1135 07/03/17  08/31/17 10/24/17 01/04/18  WBC 4.5 5.2 6.2   < > 7 5.2 4.7  HGB 11.0* 12.8 12.0   < > 11.4 11.8* 11.8  HCT 32.4* 36.9 34*   < > 32.1 33* 34.6  MCV 97.3 97.9  --   --   --   --  98.6  PLT 143* 172 188  --   --  173  --    < > = values in this interval not displayed.   Lab Results  Component Value Date   TSH 1.71 01/04/2018   No results found  for: HGBA1C Lab Results  Component Value Date   CHOL 175 11/10/2014   HDL 53 11/10/2014   LDLCALC 87 11/10/2014   TRIG 173 (A) 11/10/2014    Significant Diagnostic Results in last 30 days:  No results found.  Assessment/Plan  1. Benign hypertension with CKD (chronic kidney disease) stage III (HCC) Her pain and CKD could be contributing to her elevated BP reading along with known history of hypertension. Continue atenolol and irbesartan current regimen. Change hydralazine from 100 mg  bid with 50 mg at noon to 100 mg tid and monitor BP reading at 10 am and 9 pm.   2. Generalized arthritis Continue current regimen of tylenol and percocet along with lidocaine patch. ROM exercise encouraged as tolerated.   3. Moderate protein-calorie malnutrition ([place]) Continue remeron but change dosing as below. Encourage po intake. Reviewed weight with 4 lb weight loss since last visit  4. Depression with anxiety Change remeron to 30 mg daily. Monitor her mood.     Family/ staff Communication: reviewed care plan with patient and charge nurse.    Labs/tests ordered:  CMP with eGFR in 2 weeks  Blanchie Serve, MD Internal Medicine [facility] [address] [place], [place] [address] Cell Phone (Monday-Friday 8 am - 5 pm): [phone] On Call: [phone] and follow prompts after 5 pm and on weekends Office Phone: [phone] Office Fax: [phone]

## 2018-01-18 ENCOUNTER — Other Ambulatory Visit: Payer: Self-pay | Admitting: *Deleted

## 2018-01-18 MED ORDER — OXYCODONE-ACETAMINOPHEN 10-325 MG PO TABS: 1.0000 | ORAL_TABLET | ORAL | 0 refills | Status: DC

## 2018-01-29 DIAGNOSIS — Z79899 Other long term (current) drug therapy: Secondary | ICD-10-CM | POA: Diagnosis not present

## 2018-01-29 DIAGNOSIS — R55 Syncope and collapse: Secondary | ICD-10-CM | POA: Diagnosis not present

## 2018-01-29 DIAGNOSIS — I10 Essential (primary) hypertension: Secondary | ICD-10-CM | POA: Diagnosis not present

## 2018-01-30 LAB — BASIC METABOLIC PANEL
BUN: 34 — AB (ref 4–21)
Creatinine: 1.1 (ref 0.5–1.1)
Glucose: 86
POTASSIUM: 4 (ref 3.4–5.3)
SODIUM: 131 — AB (ref 137–147)

## 2018-01-30 LAB — HEPATIC FUNCTION PANEL
ALT: 12 (ref 7–35)
AST: 18 (ref 13–35)
Alkaline Phosphatase: 53 (ref 25–125)
Bilirubin, Total: 0.7

## 2018-03-27 ENCOUNTER — Non-Acute Institutional Stay: Payer: Medicare Other | Admitting: Family

## 2018-03-27 ENCOUNTER — Encounter: Payer: Self-pay | Admitting: Family

## 2018-03-27 DIAGNOSIS — N183 Chronic kidney disease, stage 3 (moderate): Secondary | ICD-10-CM | POA: Diagnosis not present

## 2018-03-27 DIAGNOSIS — I129 Hypertensive chronic kidney disease with stage 1 through stage 4 chronic kidney disease, or unspecified chronic kidney disease: Secondary | ICD-10-CM

## 2018-03-27 NOTE — Progress Notes (Signed)
Location:  [facility] Room Number: 35A Place of Service:  ALF [phone]) Provider:   FNP-C  , Nelda Bucks, NP  Patient Care Team: , Nelda Bucks, NP as PCP - General (Family Medicine)  Extended Emergency Contact Information Primary Emergency Contact: Lenart,[name] Address: [address] [place], [place] [address] [place] [employer] Phone: [phone] Relation: Son  Code Status:  DNR Goals of care: Advanced Directive information Advanced Directives 03/27/2018  Does Patient Have a Medical Advance Directive? Yes  Type of Paramedic of [place];Living will;Out of facility DNR (pink MOST or yellow form)  Does patient want to make changes to medical advance directive? No - Patient declined  Copy of [place] in Chart? Yes - validated most recent copy scanned in chart (See row information)  Would patient like information on creating a medical advance directive? -  Pre-existing out of facility DNR order (yellow form or pink MOST form) Yellow form placed in chart (order not valid for inpatient use);Pink MOST form placed in chart (order not valid for inpatient use)     Chief Complaint  Patient presents with  . Acute Visit    Elevated BPs    HPI:  Pt is a 84 y.o. female seen today at [facility] for an acute visit for evaluation of elevated blood pressure readings.she is seen in her room today with facility Nurse present at bedside.Nurse reported via SBAR that patient's blood pressure fluctuate. Blood pressure readings evaluated morning B/P in the 150's/70's-160's/80's few readings noted SBP> 170-200.Afternoon B/p ranges in the 120's/70's-180's/90's. She denies any headache,dizziness,blurry vision,chest pain or shortness of breath.she also denies feelings of anxiety.Patient states wakes in the morning with arthritic pain on both knees after a long night.    Past Medical History:    Diagnosis Date  . Acquired cyst of kidney    left  . Allergic rhinitis, cause unspecified   . Anemia, unspecified   . Chronic hyponatremia 09/05/2015  . CKD stage 2 due to type 2 diabetes mellitus ([place]) 11/18/2014  . Closed fracture of unspecified part of vertebral column without mention of spinal cord injury    T7 s/p kyphoplasty, T12  . Edema 2015  . Herpes simplex without mention of complication   . History of Epstein-Barr virus infection   . Hyposmolality and/or hyponatremia    07/13/15 Na 131  . Insomnia   . Insomnia, unspecified   . Left cavernous carotid aneurysm 08/2008   1-1/29mm aneurysm of cavernous carotid artery  . Malignant neoplasm of breast (female), unspecified site 1990   left. Had surgerey and chemo, but no radiation  . 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 Neuropathy   . Osteoarthrosis of knee    bilaterally  . Osteoarthrosis, hip   . Osteoarthrosis, unspecified whether generalized or localized, forearm    bilaterally wrist  . Other and unspecified nonspecific immunological findings    positive ANA  . Other B-complex deficiencies   . Pain in joint, site unspecified    severe, diffuse, chronic pain  . Positive ANA (antinuclear antibody)   . Pulmonary embolism ([place]) 05/07/2015  . Pure hypercholesterolemia   . Senile osteoporosis   . Unspecified essential hypertension   . Unspecified gastritis and gastroduodenitis without mention of hemorrhage   . Unspecified hypothyroidism   . Unspecified vitamin D deficiency  Past Surgical History:  Procedure Laterality Date  . BREAST SURGERY Left   . DILATATION & CURETTAGE/HYSTEROSCOPY WITH MYOSURE N/A 06/27/2017   Procedure: DILATATION & CURETTAGE/HYSTEROSCOPY WITH MYOSURE;  Surgeon: Servando Salina, MD;  Location: [facility];  Service: Gynecology;  Laterality: N/A;  . DILATION AND CURETTAGE OF UTERUS  X 2  . FEMUR IM NAIL Right 03/18/2015   Procedure: INTRAMEDULLARY (IM)  NAIL FEMORAL;  Surgeon: Rod Can, MD;  Location: [facility];  Service: Orthopedics;  Laterality: Right;  . FRACTURE SURGERY     hip  . KYPHOPLASTY  07/03/2010   T12  . KYPHOPLASTY     C7  . LAPAROSCOPIC CHOLECYSTECTOMY  09/20/2006  . MASTECTOMY Left 04/29/1989  . MIDDLE EAR SURGERY Bilateral 1938  . NASAL SINUS SURGERY  1990 & 2000  . ORIF HIP FRACTURE Left 06/13/2007   following a syncopal episode  . TONSILLECTOMY  1968    Allergies  Allergen Reactions  . Aspirin     Drop in body temp with large quantities, can tolerate low doses of aspirin  . Penicillins Swelling    Has patient had a PCN reaction causing immediate rash, facial/tongue/throat swelling, SOB or lightheadedness with hypotension: No Has patient had a PCN reaction causing severe rash involving mucus membranes or skin necrosis: No Has patient had a PCN reaction that required hospitalization No Has patient had a PCN reaction occurring within the last 10 years: No If all of the above answers are "NO", then may proceed with Cephalosporin use.    Outpatient Encounter Medications as of 03/27/2018  Medication Sig  . acetaminophen (TYLENOL) 500 MG tablet Take 1,000 mg by mouth at bedtime.   Marland Kitchen acetaminophen (TYLENOL) 500 MG tablet Take 500 mg by mouth every morning.   Marland Kitchen atenolol (TENORMIN) 50 MG tablet Take 50 mg by mouth at bedtime. Hold for SBP < 110 or HR < 60 b/min. Recheck B/P if Systolic is greater than 366, chart in Vital Signs  . carbamide peroxide (DEBROX) 6.5 % OTIC solution Place 5 drops into both ears 2 (two) times daily.  . feeding supplement (BOOST / RESOURCE BREEZE) LIQD Take 1 Container by mouth 2 (two) times daily between meals. (1000 & 1600) Prefers Chocolate  . hydrALAZINE (APRESOLINE) 100 MG tablet Take 100 mg by mouth 3 (three) times daily. If SBP >160 recheck B/P and document in vital sign record.  Marland Kitchen ipratropium (ATROVENT) 0.06 % nasal spray Place 2 sprays into both nostrils 2 (two) times daily as needed for  rhinitis. PRN-NASAL DRAINAGE  . irbesartan (AVAPRO) 300 MG tablet Take 300 mg by mouth daily. HOLD for B/P <44/03, If systolic B/P is over 474, recheck B/P, document in Vital Signs.  Marland Kitchen levothyroxine (SYNTHROID, LEVOTHROID) 150 MCG tablet Take 150 mcg by mouth daily before breakfast. (0730)  . Lidocaine (ASPERCREME LIDOCAINE) 4 % PTCH Apply 3 patches topically once. Apply 1 patch to the left hip, left knee, and the right shoulder in the morning. Remove in the evening.  . loratadine (CLARITIN) 10 MG tablet Take 1 tablet (10 mg total) by mouth daily.  . Menthol, Topical Analgesic, (BIOFREEZE) 4 % GEL Apply 1 application topically 3 (three) times daily as needed. Knee and hand pain  . mirtazapine (REMERON) 30 MG tablet Take 30 mg by mouth at bedtime.  . ondansetron (ZOFRAN) 4 MG tablet Take 4 mg by mouth every 8 (eight) hours as needed for nausea or vomiting.  Marland Kitchen oxyCODONE-acetaminophen (PERCOCET) 10-325 MG tablet Take 1 tablet by mouth as  needed for pain. Take one by mouth daily at 12 am as needed for severe pain. HOLD IF RESPIRATIONS <12.  . oxyCODONE-acetaminophen (PERCOCET) 10-325 MG tablet Take 1 tablet by mouth 4 (four) times daily. Change administration time of oxycodone - APAP 10 -325 mg PO to 6am, 12pm, 6pm, and 12pm. PLEASE ENCOURAGE RESIDENT TO TAKE THIS MEDICATION WITH FOOD  . pantoprazole (PROTONIX) 40 MG tablet Take 40 mg by mouth daily.  . polyethylene glycol (MIRALAX / GLYCOLAX) packet Take 17 g by mouth daily.  . promethazine (PHENERGAN) 25 MG suppository Place 25 mg rectally every 6 (six) hours as needed for nausea or vomiting. nausea WITH vomiting x 24 hours. Clear liquid diet x24 hours. If NOT effective, notify MD.  . psyllium (METAMUCIL) 58.6 % powder Take 1 packet by mouth daily. Mix in 6 oz of water or juice  . ranitidine (ZANTAC) 75 MG tablet Take 75 mg by mouth at bedtime.   . sennosides-docusate sodium (SENOKOT-S) 8.6-50 MG tablet Take 1 tablet by mouth daily. Hold for loose stool   . UNABLE TO FIND Med Name: Hydrocolloid dressing 4x4. Change dressing to the coccyx as needed. Check patency daily  . zinc oxide 20 % ointment Apply 1 application topically 3 (three) times daily as needed for irritation. Apply to buttocks for redness/dark pink/excoriation   No facility-administered encounter medications on file as of 03/27/2018.     Review of Systems  Constitutional: Negative for chills, fatigue and fever.  HENT: Positive for hearing loss. Negative for congestion, rhinorrhea, sinus pressure, sinus pain, sneezing and sore throat.   Eyes: Negative for discharge, redness, itching and visual disturbance.  Respiratory: Negative for cough, chest tightness, shortness of breath and wheezing.   Cardiovascular: Negative for chest pain, palpitations and leg swelling.  Gastrointestinal: Negative for abdominal distention, abdominal pain, constipation, diarrhea, nausea and vomiting.  Genitourinary: Negative for dysuria, flank pain, frequency and urgency.  Musculoskeletal: Positive for arthralgias and gait problem.  Skin: Negative for color change, pallor and rash.  Neurological: Negative for dizziness, syncope, light-headedness and headaches.  Psychiatric/Behavioral: Negative for agitation, confusion and sleep disturbance. The patient is not nervous/anxious.     Immunization History  Administered Date(s) Administered  . Influenza Split 02/13/2013  . Influenza-Unspecified 02/27/2014, 02/12/2015, 02/25/2016, 03/08/2017, 02/19/2018  . PPD Test 09/09/2015  . Pneumococcal Conjugate-13 05/05/2016  . Pneumococcal Polysaccharide-23 02/13/2013  . Td 05/05/2016  . Zoster 05/04/2016   Pertinent  Health Maintenance Due  Topic Date Due  . MAMMOGRAM  03/21/1945  . INFLUENZA VACCINE  Completed  . DEXA SCAN  Completed  . PNA vac Low Risk Adult  Completed   Fall Risk  01/08/2018 01/02/2017 11/10/2015 10/20/2015 10/20/2015  Falls in the past year? No No No Yes No  Number falls in past yr: - - - 2  or more -  Injury with Fall? - - - Yes -  Risk Factor Category  - - - - -  Risk for fall due to : - - - History of fall(s);Impaired balance/gait -  Follow up - - - - -   Functional Status Survey:    Vitals:   03/27/18 0959  BP: (!) 186/86  Pulse: (!) 57  Resp: 18  Temp: 99 F (37.2 C)  TempSrc: Oral  SpO2: 98%  Weight: 122 lb (55.3 kg)  Height: 5\' 8"  (1.727 m)   Body mass index is 18.55 kg/m. Physical Exam  Constitutional: She is oriented to person, place, and time. She appears well-developed.  Elderly  in no acute distress   HENT:  Head: Normocephalic.  Mouth/Throat: Oropharynx is clear and moist. No oropharyngeal exudate.  Eyes: Pupils are equal, round, and reactive to light. Conjunctivae are normal. Right eye exhibits no discharge. Left eye exhibits no discharge. No scleral icterus.  Neck: Normal range of motion. No JVD present. No thyromegaly present.  Cardiovascular: Normal rate, regular rhythm, normal heart sounds and intact distal pulses. Exam reveals no gallop and no friction rub.  No murmur heard. Pulmonary/Chest: Effort normal and breath sounds normal. She has no wheezes. She has no rales.  Abdominal: Soft. Bowel sounds are normal. She exhibits no distension and no mass. There is no tenderness. There is no rebound and no guarding.  Musculoskeletal: She exhibits no edema or tenderness.  Moves x 4 extremities.unsteady gait ambulates with Rolator.  Lymphadenopathy:    She has no cervical adenopathy.  Neurological: She is oriented to person, place, and time. Gait abnormal.  Skin: Skin is warm and dry. No rash noted. No erythema. No pallor.  Psychiatric: She has a normal mood and affect. Her behavior is normal. Judgment and thought content normal.  Nursing note and vitals reviewed.   Labs reviewed: Recent Labs    03/30/17 0604 03/30/17 1016 03/31/17 0557  06/27/17 1135  07/13/17  11/02/17 12/07/17 01/04/18 01/30/18  NA 127*  --  127*   < > 128*   < > 128*  128    < > 130* 130* 131 131*  K 4.0  --  4.1   < > 4.2   < > 3.9  3.9   < > 4.0 3.9 3.8 4.0  CL 97*  --  97*   < > 97*  --  97  --  96 95  --   --   CO2 19*  --  24  --  23  --  24  --  27 28  --   --   GLUCOSE 114*  --  96  --  114*  --   --   --   --   --   --   --   BUN 21*  --  21*   < > 23*   < > 21  21   < > 23* 24* 31* 34*  CREATININE 1.03*  --  1.15*   < > 1.06*   < > 1.0  0.98   < > 1.1 1.0 1.12 1.1  CALCIUM 8.7*  --  8.4*   < > 8.6*  --  8.2  8.2   < > 9.1 8.9 8.8  --   MG  --  2.1  --   --   --   --   --   --   --   --   --   --   PHOS  --  2.6  --   --   --   --   --   --   --   --   --   --    < > = values in this interval not displayed.   Recent Labs    03/31/17 0557  08/31/17 10/19/17 01/30/18  AST 19   < > 20 28 18   ALT 13*   < > 1.57 16 12  ALKPHOS 47   < > 53 76 53  BILITOT 0.9  --  0.8 0.8  --   PROT 6.2*  --  5.5 6.7  --   ALBUMIN 3.3*  --  3.3 3.8  --    < > = values in this interval not displayed.   Recent Labs    03/31/17 0557 06/27/17 1135 07/03/17  08/31/17 10/24/17 01/04/18  WBC 4.5 5.2 6.2   < > 7 5.2 4.7  HGB 11.0* 12.8 12.0   < > 11.4 11.8* 11.8  HCT 32.4* 36.9 34*   < > 32.1 33* 34.6  MCV 97.3 97.9  --   --   --   --  98.6  PLT 143* 172 188  --   --  173  --    < > = values in this interval not displayed.   Lab Results  Component Value Date   TSH 1.71 01/04/2018   No results found for: HGBA1C Lab Results  Component Value Date   CHOL 175 11/10/2014   HDL 53 11/10/2014   LDLCALC 87 11/10/2014   TRIG 173 (A) 11/10/2014    Significant Diagnostic Results in last 30 days:  No results found.  Assessment/Plan 1. Benign hypertension with CKD (chronic kidney disease) stage III ([facility]) B/p readings mostly elevated in the mornings.Associates high B/P with waking up in pain.Otherwise asymptomatic.continue on irbesartan 300 mg tablet daily,atenolol 50 mg tablet at bedtime and hydralazine 100 mg tablet three times daily.continue to monitor blood  pressure twice daily.add clonidine 0.1 mg tablet one by mouth twice daily as needed for SBP > 160    Family/ staff Communication: Reviewed plan of care with patient and facility Nurse.   Labs/tests ordered: None    C , NP

## 2018-04-02 DIAGNOSIS — M6281 Muscle weakness (generalized): Secondary | ICD-10-CM | POA: Diagnosis not present

## 2018-04-02 DIAGNOSIS — G4700 Insomnia, unspecified: Secondary | ICD-10-CM | POA: Diagnosis not present

## 2018-04-02 DIAGNOSIS — S72001A Fracture of unspecified part of neck of right femur, initial encounter for closed fracture: Secondary | ICD-10-CM | POA: Diagnosis not present

## 2018-04-02 DIAGNOSIS — S72141A Displaced intertrochanteric fracture of right femur, initial encounter for closed fracture: Secondary | ICD-10-CM | POA: Diagnosis not present

## 2018-04-02 DIAGNOSIS — W19XXXA Unspecified fall, initial encounter: Secondary | ICD-10-CM | POA: Diagnosis not present

## 2018-04-02 DIAGNOSIS — E039 Hypothyroidism, unspecified: Secondary | ICD-10-CM | POA: Diagnosis not present

## 2018-04-02 DIAGNOSIS — D500 Iron deficiency anemia secondary to blood loss (chronic): Secondary | ICD-10-CM | POA: Diagnosis not present

## 2018-04-02 DIAGNOSIS — D649 Anemia, unspecified: Secondary | ICD-10-CM | POA: Diagnosis not present

## 2018-04-02 DIAGNOSIS — R609 Edema, unspecified: Secondary | ICD-10-CM | POA: Diagnosis not present

## 2018-04-02 DIAGNOSIS — N182 Chronic kidney disease, stage 2 (mild): Secondary | ICD-10-CM | POA: Diagnosis not present

## 2018-04-02 DIAGNOSIS — S329XXA Fracture of unspecified parts of lumbosacral spine and pelvis, initial encounter for closed fracture: Secondary | ICD-10-CM | POA: Diagnosis not present

## 2018-04-02 DIAGNOSIS — E7800 Pure hypercholesterolemia, unspecified: Secondary | ICD-10-CM | POA: Diagnosis not present

## 2018-04-02 DIAGNOSIS — M1991 Primary osteoarthritis, unspecified site: Secondary | ICD-10-CM | POA: Diagnosis not present

## 2018-04-02 DIAGNOSIS — R42 Dizziness and giddiness: Secondary | ICD-10-CM | POA: Diagnosis not present

## 2018-04-02 DIAGNOSIS — I10 Essential (primary) hypertension: Secondary | ICD-10-CM | POA: Diagnosis not present

## 2018-04-02 DIAGNOSIS — R2681 Unsteadiness on feet: Secondary | ICD-10-CM | POA: Diagnosis not present

## 2018-04-02 DIAGNOSIS — M1610 Unilateral primary osteoarthritis, unspecified hip: Secondary | ICD-10-CM | POA: Diagnosis not present

## 2018-04-02 DIAGNOSIS — M810 Age-related osteoporosis without current pathological fracture: Secondary | ICD-10-CM | POA: Diagnosis not present

## 2018-04-02 DIAGNOSIS — C50919 Malignant neoplasm of unspecified site of unspecified female breast: Secondary | ICD-10-CM | POA: Diagnosis not present

## 2018-04-02 DIAGNOSIS — Z4789 Encounter for other orthopedic aftercare: Secondary | ICD-10-CM | POA: Diagnosis not present

## 2018-04-03 ENCOUNTER — Non-Acute Institutional Stay: Payer: Medicare Other | Admitting: Family Medicine

## 2018-04-03 ENCOUNTER — Encounter: Payer: Self-pay | Admitting: Family Medicine

## 2018-04-03 DIAGNOSIS — I10 Essential (primary) hypertension: Secondary | ICD-10-CM

## 2018-04-03 DIAGNOSIS — G4700 Insomnia, unspecified: Secondary | ICD-10-CM

## 2018-04-03 DIAGNOSIS — N182 Chronic kidney disease, stage 2 (mild): Secondary | ICD-10-CM

## 2018-04-03 DIAGNOSIS — M150 Primary generalized (osteo)arthritis: Secondary | ICD-10-CM | POA: Diagnosis not present

## 2018-04-03 DIAGNOSIS — M159 Polyosteoarthritis, unspecified: Secondary | ICD-10-CM

## 2018-04-03 NOTE — Progress Notes (Signed)
Location:  [facility] Room Number: [place] of Service:  ALF [phone])  Provider: Alain Honey MD  Code Status: DNR Goals of Care:  Advanced Directives 04/03/2018  Does Patient Have a Medical Advance Directive? Yes  Type of Paramedic of [place];Living will;Out of facility DNR (pink MOST or yellow form)  Does patient want to make changes to medical advance directive? No - Patient declined  Copy of [place] in Chart? Yes - validated most recent copy scanned in chart (See row information)  Would patient like information on creating a medical advance directive? -  Pre-existing out of facility DNR order (yellow form or pink MOST form) Yellow form placed in chart (order not valid for inpatient use);Pink MOST form placed in chart (order not valid for inpatient use)     Chief Complaint  Patient presents with  . Medical Management of Chronic Issues    Routine Visit    HPI: Patient is a 84 y.o. female seen today for medical management of chronic diseases.  Diseases include hypertension, osteoarthritis, pression.  Speaks with a charming [place] accent.  About several issues including blood pressure.  Is recently been PRN clonidine for pressures 170.  Her soon-to-be higher in the mornings which one might expect.  She continues to take hydralazine 100 mg 3 times daily as well as irbesartan 300 mg She does have some continual ongoing pain in her knees X extra strength Tylenol as well as oxycodone for that pain line she has been sleeping well at night since Remeron increased to 30 mg.  It should theoretically be more effective at the lower dose for sleep will not argue with she has lost some weight and is on supplements that she does not particularly like the food.   Past Medical History:  Diagnosis Date  . Acquired cyst of kidney    left  . Allergic rhinitis, cause unspecified   . Anemia, unspecified   . Chronic hyponatremia  09/05/2015  . CKD stage 2 due to type 2 diabetes mellitus ([place]) 11/18/2014  . Closed fracture of unspecified part of vertebral column without mention of spinal cord injury    T7 s/p kyphoplasty, T12  . Edema 2015  . Herpes simplex without mention of complication   . History of Epstein-Barr virus infection   . Hyposmolality and/or hyponatremia    07/13/15 Na 131  . Insomnia   . Insomnia, unspecified   . Left cavernous carotid aneurysm 08/2008   1-1/62mm aneurysm of cavernous carotid artery  . Malignant neoplasm of breast (female), unspecified site 1990   left. Had surgerey and chemo, but no radiation  . 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 Neuropathy   . Osteoarthrosis of knee    bilaterally  . Osteoarthrosis, hip   . Osteoarthrosis, unspecified whether generalized or localized, forearm    bilaterally wrist  . Other and unspecified nonspecific immunological findings    positive ANA  . Other B-complex deficiencies   . Pain in joint, site unspecified    severe, diffuse, chronic pain  . Positive ANA (antinuclear antibody)   . Pulmonary embolism ([place]) 05/07/2015  . Pure hypercholesterolemia   . Senile osteoporosis   . Unspecified essential hypertension   . Unspecified gastritis and gastroduodenitis without mention of hemorrhage   . Unspecified hypothyroidism   . Unspecified vitamin D deficiency     Past Surgical History:  Procedure Laterality Date  .  BREAST SURGERY Left   . DILATATION & CURETTAGE/HYSTEROSCOPY WITH MYOSURE N/A 06/27/2017   Procedure: DILATATION & CURETTAGE/HYSTEROSCOPY WITH MYOSURE;  Surgeon: Servando Salina, MD;  Location: [facility];  Service: Gynecology;  Laterality: N/A;  . DILATION AND CURETTAGE OF UTERUS  X 2  . FEMUR IM NAIL Right 03/18/2015   Procedure: INTRAMEDULLARY (IM) NAIL FEMORAL;  Surgeon: Rod Can, MD;  Location: [facility];  Service: Orthopedics;  Laterality: Right;  . FRACTURE SURGERY     hip  .  KYPHOPLASTY  07/03/2010   T12  . KYPHOPLASTY     C7  . LAPAROSCOPIC CHOLECYSTECTOMY  09/20/2006  . MASTECTOMY Left 04/29/1989  . MIDDLE EAR SURGERY Bilateral 1938  . NASAL SINUS SURGERY  1990 & 2000  . ORIF HIP FRACTURE Left 06/13/2007   following a syncopal episode  . TONSILLECTOMY  1968    Allergies  Allergen Reactions  . Aspirin     Drop in body temp with large quantities, can tolerate low doses of aspirin  . Penicillins Swelling    Has patient had a PCN reaction causing immediate rash, facial/tongue/throat swelling, SOB or lightheadedness with hypotension: No Has patient had a PCN reaction causing severe rash involving mucus membranes or skin necrosis: No Has patient had a PCN reaction that required hospitalization No Has patient had a PCN reaction occurring within the last 10 years: No If all of the above answers are "NO", then may proceed with Cephalosporin use.    Outpatient Encounter Medications as of 04/03/2018  Medication Sig  . acetaminophen (TYLENOL) 500 MG tablet Take 1,000 mg by mouth at bedtime.   Marland Kitchen acetaminophen (TYLENOL) 500 MG tablet Take 500 mg by mouth every morning.   Marland Kitchen atenolol (TENORMIN) 50 MG tablet Take 50 mg by mouth at bedtime. Hold for SBP < 110 or HR < 60 b/min. Recheck B/P if Systolic is greater than 161, chart in Vital Signs  . carbamide peroxide (DEBROX) 6.5 % OTIC solution Place 5 drops into both ears 2 (two) times daily.  . feeding supplement (BOOST / RESOURCE BREEZE) LIQD Take 1 Container by mouth 2 (two) times daily between meals. (1000 & 1600) Prefers Chocolate  . hydrALAZINE (APRESOLINE) 100 MG tablet Take 100 mg by mouth 3 (three) times daily. If SBP >160 recheck B/P and document in vital sign record.  Marland Kitchen ipratropium (ATROVENT) 0.06 % nasal spray Place 2 sprays into both nostrils 2 (two) times daily as needed for rhinitis. PRN-NASAL DRAINAGE  . irbesartan (AVAPRO) 300 MG tablet Take 300 mg by mouth daily. HOLD for B/P <09/60, If systolic B/P is over  454, recheck B/P, document in Vital Signs.  Marland Kitchen levothyroxine (SYNTHROID, LEVOTHROID) 150 MCG tablet Take 150 mcg by mouth daily before breakfast. (0730)  . Lidocaine (ASPERCREME LIDOCAINE) 4 % PTCH Apply 3 patches topically once. Apply 1 patch to the left hip, left knee, and the right shoulder in the morning. Remove in the evening.  . loratadine (CLARITIN) 10 MG tablet Take 1 tablet (10 mg total) by mouth daily.  . Menthol, Topical Analgesic, (BIOFREEZE) 4 % GEL Apply 1 application topically 3 (three) times daily as needed. Knee and hand pain  . mirtazapine (REMERON) 30 MG tablet Take 30 mg by mouth at bedtime.  . ondansetron (ZOFRAN) 4 MG tablet Take 4 mg by mouth every 8 (eight) hours as needed for nausea or vomiting.  Marland Kitchen oxyCODONE-acetaminophen (PERCOCET) 10-325 MG tablet Take 1 tablet by mouth as needed for pain. Take one by mouth daily at  12 am as needed for severe pain. HOLD IF RESPIRATIONS <12.  . oxyCODONE-acetaminophen (PERCOCET) 10-325 MG tablet Take 1 tablet by mouth 4 (four) times daily. Change administration time of oxycodone - APAP 10 -325 mg PO to 6am, 12pm, 6pm, and 12pm. PLEASE ENCOURAGE RESIDENT TO TAKE THIS MEDICATION WITH FOOD  . pantoprazole (PROTONIX) 40 MG tablet Take 40 mg by mouth daily.  . polyethylene glycol (MIRALAX / GLYCOLAX) packet Take 17 g by mouth daily.  . promethazine (PHENERGAN) 25 MG suppository Place 25 mg rectally every 6 (six) hours as needed for nausea or vomiting. nausea WITH vomiting x 24 hours. Clear liquid diet x24 hours. If NOT effective, notify MD.  . psyllium (METAMUCIL) 58.6 % powder Take 1 packet by mouth daily. Mix in 6 oz of water or juice  . ranitidine (ZANTAC) 75 MG tablet Take 75 mg by mouth at bedtime.   . sennosides-docusate sodium (SENOKOT-S) 8.6-50 MG tablet Take 1 tablet by mouth daily. Hold for loose stool  . UNABLE TO FIND Med Name: Hydrocolloid dressing 4x4. Change dressing to the coccyx as needed. Check patency daily  . zinc oxide 20 %  ointment Apply 1 application topically 3 (three) times daily as needed for irritation. Apply to buttocks for redness/dark pink/excoriation   No facility-administered encounter medications on file as of 04/03/2018.     Review of Systems:  Review of Systems  Constitutional: Positive for unexpected weight change.  HENT: Negative.   Respiratory: Negative.   Cardiovascular: Negative.   Gastrointestinal: Negative.   Musculoskeletal: Positive for arthralgias and gait problem.  Psychiatric/Behavioral: Negative.     Health Maintenance  Topic Date Due  . MAMMOGRAM  03/21/1945  . TETANUS/TDAP  05/05/2026  . INFLUENZA VACCINE  Completed  . DEXA SCAN  Completed  . PNA vac Low Risk Adult  Completed    Physical Exam: Vitals:   04/03/18 1011  BP: (!) 145/86  Pulse: 62  Resp: 18  Temp: 99.1 F (37.3 C)  TempSrc: Oral  SpO2: 98%  Weight: 124 lb (56.2 kg)  Height: 5\' 8"  (1.727 m)   Body mass index is 18.85 kg/m. Physical Exam  Constitutional: She is oriented to person, place, and time. She appears well-developed and well-nourished.  HENT:  Head: Normocephalic.  Mouth/Throat: Oropharynx is clear and moist.  Eyes: Pupils are equal, round, and reactive to light.  Cardiovascular: Normal rate, regular rhythm and normal heart sounds.  Pulmonary/Chest: Effort normal and breath sounds normal.  Abdominal: Soft.  Musculoskeletal: Normal range of motion.  Knees have some crepitance with flexion and extension  Neurological: She is alert and oriented to person, place, and time.  Psychiatric: She has a normal mood and affect. Her behavior is normal. Judgment and thought content normal.  Nursing note and vitals reviewed.   Labs reviewed: Basic Metabolic Panel: Recent Labs    06/27/17 1135  07/13/17  08/31/17  11/02/17 12/07/17 01/04/18 01/30/18  NA 128*   < > 128*  128   < > 127   < > 130* 130* 131 131*  K 4.2   < > 3.9  3.9   < > 4.1   < > 4.0 3.9 3.8 4.0  CL 97*  --  97  --   --   --   96 95  --   --   CO2 23  --  24  --   --   --  27 28  --   --   GLUCOSE 114*  --   --   --   --   --   --   --   --   --  BUN 23*   < > 21  21   < > 24*   < > 23* 24* 31* 34*  CREATININE 1.06*   < > 1.0  0.98   < > 1.05   < > 1.1 1.0 1.12 1.1  CALCIUM 8.6*  --  8.2  8.2   < > 8.7   < > 9.1 8.9 8.8  --   TSH  --   --   --   --  1.57  --   --   --  1.71  --    < > = values in this interval not displayed.   Liver Function Tests: Recent Labs    08/31/17 10/19/17 01/30/18  AST 20 28 18   ALT 1.57 16 12  ALKPHOS 53 76 53  BILITOT 0.8 0.8  --   PROT 5.5 6.7  --   ALBUMIN 3.3 3.8  --    Recent Labs    10/30/17  LIPASE 30   No results for input(s): AMMONIA in the last 8760 hours. CBC: Recent Labs    06/27/17 1135 07/03/17  08/31/17 10/24/17 01/04/18  WBC 5.2 6.2   < > 7 5.2 4.7  HGB 12.8 12.0   < > 11.4 11.8* 11.8  HCT 36.9 34*   < > 32.1 33* 34.6  MCV 97.9  --   --   --   --  98.6  PLT 172 188  --   --  173  --    < > = values in this interval not displayed.   Lipid Panel: No results for input(s): CHOL, HDL, LDLCALC, TRIG, CHOLHDL, LDLDIRECT in the last 8760 hours. No results found for: HGBA1C  Procedures since last visit: No results found.  Assessment/Plan 1. Essential hypertension Blood pressures have been fluctuating.  Increase nighttime atenolol to 100 mg in hopes of pressures  2. Primary osteoarthritis involving multiple joints Patient tells me she has difficulty swallowing acetaminophen capsule.  Will discontinue in favor of be given twice a day 500 and  3. CKD (chronic kidney disease) stage 2, GFR 60-89 ml/min Has been stable.  Most recent is 1.1 2 hydration  4. Insomnia, unspecified type Patient reports sleeping well is at bedtime   Labs/tests ordered:  Next appt:  3 months   M. Sabra Heck, [place] [address] [place], [place] Office [phone]

## 2018-04-04 DIAGNOSIS — S329XXA Fracture of unspecified parts of lumbosacral spine and pelvis, initial encounter for closed fracture: Secondary | ICD-10-CM | POA: Diagnosis not present

## 2018-04-04 DIAGNOSIS — M6281 Muscle weakness (generalized): Secondary | ICD-10-CM | POA: Diagnosis not present

## 2018-04-04 DIAGNOSIS — S72141A Displaced intertrochanteric fracture of right femur, initial encounter for closed fracture: Secondary | ICD-10-CM | POA: Diagnosis not present

## 2018-04-04 DIAGNOSIS — N182 Chronic kidney disease, stage 2 (mild): Secondary | ICD-10-CM | POA: Diagnosis not present

## 2018-04-04 DIAGNOSIS — R42 Dizziness and giddiness: Secondary | ICD-10-CM | POA: Diagnosis not present

## 2018-04-04 DIAGNOSIS — R2681 Unsteadiness on feet: Secondary | ICD-10-CM | POA: Diagnosis not present

## 2018-04-06 DIAGNOSIS — S329XXA Fracture of unspecified parts of lumbosacral spine and pelvis, initial encounter for closed fracture: Secondary | ICD-10-CM | POA: Diagnosis not present

## 2018-04-06 DIAGNOSIS — R2681 Unsteadiness on feet: Secondary | ICD-10-CM | POA: Diagnosis not present

## 2018-04-06 DIAGNOSIS — R42 Dizziness and giddiness: Secondary | ICD-10-CM | POA: Diagnosis not present

## 2018-04-06 DIAGNOSIS — N182 Chronic kidney disease, stage 2 (mild): Secondary | ICD-10-CM | POA: Diagnosis not present

## 2018-04-06 DIAGNOSIS — S72141A Displaced intertrochanteric fracture of right femur, initial encounter for closed fracture: Secondary | ICD-10-CM | POA: Diagnosis not present

## 2018-04-06 DIAGNOSIS — M6281 Muscle weakness (generalized): Secondary | ICD-10-CM | POA: Diagnosis not present

## 2018-04-09 DIAGNOSIS — M6281 Muscle weakness (generalized): Secondary | ICD-10-CM | POA: Diagnosis not present

## 2018-04-09 DIAGNOSIS — S72141A Displaced intertrochanteric fracture of right femur, initial encounter for closed fracture: Secondary | ICD-10-CM | POA: Diagnosis not present

## 2018-04-09 DIAGNOSIS — S329XXA Fracture of unspecified parts of lumbosacral spine and pelvis, initial encounter for closed fracture: Secondary | ICD-10-CM | POA: Diagnosis not present

## 2018-04-09 DIAGNOSIS — N182 Chronic kidney disease, stage 2 (mild): Secondary | ICD-10-CM | POA: Diagnosis not present

## 2018-04-09 DIAGNOSIS — R42 Dizziness and giddiness: Secondary | ICD-10-CM | POA: Diagnosis not present

## 2018-04-09 DIAGNOSIS — R2681 Unsteadiness on feet: Secondary | ICD-10-CM | POA: Diagnosis not present

## 2018-04-10 DIAGNOSIS — R42 Dizziness and giddiness: Secondary | ICD-10-CM | POA: Diagnosis not present

## 2018-04-10 DIAGNOSIS — N182 Chronic kidney disease, stage 2 (mild): Secondary | ICD-10-CM | POA: Diagnosis not present

## 2018-04-10 DIAGNOSIS — S329XXA Fracture of unspecified parts of lumbosacral spine and pelvis, initial encounter for closed fracture: Secondary | ICD-10-CM | POA: Diagnosis not present

## 2018-04-10 DIAGNOSIS — R2681 Unsteadiness on feet: Secondary | ICD-10-CM | POA: Diagnosis not present

## 2018-04-10 DIAGNOSIS — M6281 Muscle weakness (generalized): Secondary | ICD-10-CM | POA: Diagnosis not present

## 2018-04-10 DIAGNOSIS — S72141A Displaced intertrochanteric fracture of right femur, initial encounter for closed fracture: Secondary | ICD-10-CM | POA: Diagnosis not present

## 2018-04-11 DIAGNOSIS — M6281 Muscle weakness (generalized): Secondary | ICD-10-CM | POA: Diagnosis not present

## 2018-04-11 DIAGNOSIS — S72141A Displaced intertrochanteric fracture of right femur, initial encounter for closed fracture: Secondary | ICD-10-CM | POA: Diagnosis not present

## 2018-04-11 DIAGNOSIS — N182 Chronic kidney disease, stage 2 (mild): Secondary | ICD-10-CM | POA: Diagnosis not present

## 2018-04-11 DIAGNOSIS — S329XXA Fracture of unspecified parts of lumbosacral spine and pelvis, initial encounter for closed fracture: Secondary | ICD-10-CM | POA: Diagnosis not present

## 2018-04-11 DIAGNOSIS — R42 Dizziness and giddiness: Secondary | ICD-10-CM | POA: Diagnosis not present

## 2018-04-11 DIAGNOSIS — R2681 Unsteadiness on feet: Secondary | ICD-10-CM | POA: Diagnosis not present

## 2018-04-16 ENCOUNTER — Encounter: Payer: Self-pay | Admitting: Family Medicine

## 2018-04-16 ENCOUNTER — Non-Acute Institutional Stay: Payer: Medicare Other | Admitting: Family Medicine

## 2018-04-16 DIAGNOSIS — N183 Chronic kidney disease, stage 3 unspecified: Secondary | ICD-10-CM

## 2018-04-16 DIAGNOSIS — M150 Primary generalized (osteo)arthritis: Secondary | ICD-10-CM | POA: Diagnosis not present

## 2018-04-16 DIAGNOSIS — R609 Edema, unspecified: Secondary | ICD-10-CM | POA: Diagnosis not present

## 2018-04-16 DIAGNOSIS — S72141A Displaced intertrochanteric fracture of right femur, initial encounter for closed fracture: Secondary | ICD-10-CM | POA: Diagnosis not present

## 2018-04-16 DIAGNOSIS — E7800 Pure hypercholesterolemia, unspecified: Secondary | ICD-10-CM | POA: Diagnosis not present

## 2018-04-16 DIAGNOSIS — R2681 Unsteadiness on feet: Secondary | ICD-10-CM | POA: Diagnosis not present

## 2018-04-16 DIAGNOSIS — M159 Polyosteoarthritis, unspecified: Secondary | ICD-10-CM

## 2018-04-16 DIAGNOSIS — M1991 Primary osteoarthritis, unspecified site: Secondary | ICD-10-CM | POA: Diagnosis not present

## 2018-04-16 DIAGNOSIS — N182 Chronic kidney disease, stage 2 (mild): Secondary | ICD-10-CM | POA: Diagnosis not present

## 2018-04-16 DIAGNOSIS — E039 Hypothyroidism, unspecified: Secondary | ICD-10-CM | POA: Diagnosis not present

## 2018-04-16 DIAGNOSIS — M1610 Unilateral primary osteoarthritis, unspecified hip: Secondary | ICD-10-CM | POA: Diagnosis not present

## 2018-04-16 DIAGNOSIS — S72001A Fracture of unspecified part of neck of right femur, initial encounter for closed fracture: Secondary | ICD-10-CM | POA: Diagnosis not present

## 2018-04-16 DIAGNOSIS — R42 Dizziness and giddiness: Secondary | ICD-10-CM | POA: Diagnosis not present

## 2018-04-16 DIAGNOSIS — Z4789 Encounter for other orthopedic aftercare: Secondary | ICD-10-CM | POA: Diagnosis not present

## 2018-04-16 DIAGNOSIS — S329XXA Fracture of unspecified parts of lumbosacral spine and pelvis, initial encounter for closed fracture: Secondary | ICD-10-CM | POA: Diagnosis not present

## 2018-04-16 DIAGNOSIS — D500 Iron deficiency anemia secondary to blood loss (chronic): Secondary | ICD-10-CM | POA: Diagnosis not present

## 2018-04-16 DIAGNOSIS — I129 Hypertensive chronic kidney disease with stage 1 through stage 4 chronic kidney disease, or unspecified chronic kidney disease: Secondary | ICD-10-CM

## 2018-04-16 DIAGNOSIS — D649 Anemia, unspecified: Secondary | ICD-10-CM | POA: Diagnosis not present

## 2018-04-16 DIAGNOSIS — M6281 Muscle weakness (generalized): Secondary | ICD-10-CM | POA: Diagnosis not present

## 2018-04-16 DIAGNOSIS — W19XXXA Unspecified fall, initial encounter: Secondary | ICD-10-CM | POA: Diagnosis not present

## 2018-04-16 DIAGNOSIS — G4700 Insomnia, unspecified: Secondary | ICD-10-CM | POA: Diagnosis not present

## 2018-04-16 DIAGNOSIS — I10 Essential (primary) hypertension: Secondary | ICD-10-CM | POA: Diagnosis not present

## 2018-04-16 DIAGNOSIS — M810 Age-related osteoporosis without current pathological fracture: Secondary | ICD-10-CM | POA: Diagnosis not present

## 2018-04-16 DIAGNOSIS — C50919 Malignant neoplasm of unspecified site of unspecified female breast: Secondary | ICD-10-CM | POA: Diagnosis not present

## 2018-04-16 NOTE — Progress Notes (Signed)
Location:  [place] [facility] Room Number: [place] of Service:  ALF [phone])  Provider: Alain Honey MD  Code Status: DNR Goals of Care:  Advanced Directives 04/16/2018  Does Patient Have a Medical Advance Directive? Yes  Type of Paramedic of [place];Living will;Out of facility DNR (pink MOST or yellow form)  Does patient want to make changes to medical advance directive? No - Patient declined  Copy of [place] in Chart? Yes - validated most recent copy scanned in chart (See row information)  Would patient like information on creating a medical advance directive? -  Pre-existing out of facility DNR order (yellow form or pink MOST form) Yellow form placed in chart (order not valid for inpatient use);Pink MOST form placed in chart (order not valid for inpatient use)     Chief Complaint  Patient presents with  . Acute Visit    Elevated BP's     HPI: Patient is a 84 y.o. female seen today for elevated blood pressure readings.  At her last visit atenolol was increased.  She continues to have some morning readings greater than 180 mm.  She contends that her blood pressure is elevated due to her arthralgias and pain in her hips and knees.   Past Medical History:  Diagnosis Date  . Acquired cyst of kidney    left  . Allergic rhinitis, cause unspecified   . Anemia, unspecified   . Chronic hyponatremia 09/05/2015  . CKD stage 2 due to type 2 diabetes mellitus ([place]) 11/18/2014  . Closed fracture of unspecified part of vertebral column without mention of spinal cord injury    T7 s/p kyphoplasty, T12  . Edema 2015  . Herpes simplex without mention of complication   . History of Epstein-Barr virus infection   . Hyposmolality and/or hyponatremia    07/13/15 Na 131  . Insomnia   . Insomnia, unspecified   . Left cavernous carotid aneurysm 08/2008   1-1/35mm aneurysm of cavernous carotid artery  . Malignant neoplasm of breast  (female), unspecified site 1990   left. Had surgerey and chemo, but no radiation  . 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 Neuropathy   . Osteoarthrosis of knee    bilaterally  . Osteoarthrosis, hip   . Osteoarthrosis, unspecified whether generalized or localized, forearm    bilaterally wrist  . Other and unspecified nonspecific immunological findings    positive ANA  . Other B-complex deficiencies   . Pain in joint, site unspecified    severe, diffuse, chronic pain  . Positive ANA (antinuclear antibody)   . Pulmonary embolism ([place]) 05/07/2015  . Pure hypercholesterolemia   . Senile osteoporosis   . Unspecified essential hypertension   . Unspecified gastritis and gastroduodenitis without mention of hemorrhage   . Unspecified hypothyroidism   . Unspecified vitamin D deficiency     Past Surgical History:  Procedure Laterality Date  . BREAST SURGERY Left   . DILATATION & CURETTAGE/HYSTEROSCOPY WITH MYOSURE N/A 06/27/2017   Procedure: DILATATION & CURETTAGE/HYSTEROSCOPY WITH MYOSURE;  Surgeon: Servando Salina, MD;  Location: [facility];  Service: Gynecology;  Laterality: N/A;  . DILATION AND CURETTAGE OF UTERUS  X 2  . FEMUR IM NAIL Right 03/18/2015   Procedure: INTRAMEDULLARY (IM) NAIL FEMORAL;  Surgeon: Rod Can, MD;  Location: [facility];  Service: Orthopedics;  Laterality: Right;  . FRACTURE SURGERY     hip  . KYPHOPLASTY  07/03/2010   T12  . KYPHOPLASTY     C7  . LAPAROSCOPIC CHOLECYSTECTOMY  09/20/2006  . MASTECTOMY Left 04/29/1989  . MIDDLE EAR SURGERY Bilateral 1938  . NASAL SINUS SURGERY  1990 & 2000  . ORIF HIP FRACTURE Left 06/13/2007   following a syncopal episode  . TONSILLECTOMY  1968    Allergies  Allergen Reactions  . Aspirin     Drop in body temp with large quantities, can tolerate low doses of aspirin  . Penicillins Swelling    Has patient had a PCN reaction causing immediate rash, facial/tongue/throat  swelling, SOB or lightheadedness with hypotension: No Has patient had a PCN reaction causing severe rash involving mucus membranes or skin necrosis: No Has patient had a PCN reaction that required hospitalization No Has patient had a PCN reaction occurring within the last 10 years: No If all of the above answers are "NO", then may proceed with Cephalosporin use.    Outpatient Encounter Medications as of 04/16/2018  Medication Sig  . acetaminophen (TYLENOL) 500 MG tablet Take 1,000 mg by mouth at bedtime.   Marland Kitchen acetaminophen (TYLENOL) 500 MG tablet Take 500 mg by mouth every morning.   Marland Kitchen atenolol (TENORMIN) 100 MG tablet Take 100 mg by mouth daily. Hold if SBP less than 110 or HR less than 60 bpm; if SBP greater than 160 chart in vital signs  . cloNIDine (CATAPRES) 0.1 MG tablet Take 0.1 mg by mouth 2 (two) times daily as needed.  . feeding supplement (BOOST / RESOURCE BREEZE) LIQD Take 1 Container by mouth 2 (two) times daily between meals. (1000 & 1600) Prefers Chocolate  . hydrALAZINE (APRESOLINE) 100 MG tablet Take 100 mg by mouth 3 (three) times daily. If SBP >160 recheck B/P and document in vital sign record.  Marland Kitchen ipratropium (ATROVENT) 0.06 % nasal spray Place 2 sprays into both nostrils 2 (two) times daily as needed for rhinitis. PRN-NASAL DRAINAGE  . irbesartan (AVAPRO) 300 MG tablet Take 300 mg by mouth daily. HOLD for B/P <56/43, If systolic B/P is over 329, recheck B/P, document in Vital Signs.  Marland Kitchen levothyroxine (SYNTHROID, LEVOTHROID) 150 MCG tablet Take 150 mcg by mouth daily before breakfast. (0730)  . Lidocaine (ASPERCREME LIDOCAINE) 4 % PTCH Apply 3 patches topically once. Apply 1 patch to the left hip, left knee, and the right shoulder in the morning. Remove in the evening.  . loratadine (CLARITIN) 10 MG tablet Take 1 tablet (10 mg total) by mouth daily.  . Menthol, Topical Analgesic, (BIOFREEZE) 4 % GEL Apply 1 application topically 3 (three) times daily as needed. Knee and hand pain    . mirtazapine (REMERON) 30 MG tablet Take 30 mg by mouth at bedtime.  . ondansetron (ZOFRAN) 4 MG tablet Take 4 mg by mouth every 8 (eight) hours as needed for nausea or vomiting.  Marland Kitchen oxyCODONE-acetaminophen (PERCOCET) 10-325 MG tablet Take 1 tablet by mouth as needed for pain. Take one by mouth daily at 12 am as needed for severe pain. HOLD IF RESPIRATIONS <12.  . oxyCODONE-acetaminophen (PERCOCET) 10-325 MG tablet Take 1 tablet by mouth 4 (four) times daily. Change administration time of oxycodone - APAP 10 -325 mg PO to 6am, 12pm, 6pm, and 12pm. PLEASE ENCOURAGE RESIDENT TO TAKE THIS MEDICATION WITH FOOD  . pantoprazole (PROTONIX) 40 MG tablet Take 40 mg by mouth daily.  . polyethylene glycol (MIRALAX / GLYCOLAX) packet Take 17 g by mouth daily.  . promethazine (PHENERGAN) 25 MG suppository Place 25 mg  rectally every 6 (six) hours as needed for nausea or vomiting. nausea WITH vomiting x 24 hours. Clear liquid diet x24 hours. If NOT effective, notify MD.  . psyllium (METAMUCIL) 58.6 % powder Take 1 packet by mouth daily. Mix in 6 oz of water or juice  . ranitidine (ZANTAC) 75 MG tablet Take 75 mg by mouth at bedtime.   . sennosides-docusate sodium (SENOKOT-S) 8.6-50 MG tablet Take 1 tablet by mouth daily. Hold for loose stool  . UNABLE TO FIND Med Name: Hydrocolloid dressing 4x4. Change dressing to the coccyx as needed. Check patency daily  . zinc oxide 20 % ointment Apply 1 application topically 3 (three) times daily as needed for irritation. Apply to buttocks for redness/dark pink/excoriation  . [DISCONTINUED] atenolol (TENORMIN) 50 MG tablet Take 50 mg by mouth at bedtime. Hold for SBP < 110 or HR < 60 b/min. Recheck B/P if Systolic is greater than 250, chart in Vital Signs  . [DISCONTINUED] carbamide peroxide (DEBROX) 6.5 % OTIC solution Place 5 drops into both ears 2 (two) times daily.   No facility-administered encounter medications on file as of 04/16/2018.     Review of Systems:  Review  of Systems  Constitutional: Negative.   HENT: Negative.   Respiratory: Negative.   Cardiovascular: Negative.   Musculoskeletal: Positive for arthralgias and gait problem.  Psychiatric/Behavioral: Negative.     Health Maintenance  Topic Date Due  . MAMMOGRAM  03/21/1945  . TETANUS/TDAP  05/05/2026  . INFLUENZA VACCINE  Completed  . DEXA SCAN  Completed  . PNA vac Low Risk Adult  Completed    Physical Exam: Vitals:   04/16/18 1333  BP: (!) 182/83  Pulse: (!) 55  Resp: 18  Temp: 98.2 F (36.8 C)  TempSrc: Oral  SpO2: 97%  Weight: 120 lb 9.6 oz (54.7 kg)  Height: 5\' 8"  (1.727 m)   Body mass index is 18.34 kg/m. Physical Exam  Constitutional: She appears well-developed and well-nourished.  Cardiovascular: Normal rate and regular rhythm.  Pulmonary/Chest: Effort normal and breath sounds normal.  Musculoskeletal:  We had discussed at her last visit with me possibility of knee joint injection to help relieve her pain Today left knee was injected with Depo medrol and lidocaine after prepping skin with alcholol  Nursing note and vitals reviewed.   Labs reviewed: Basic Metabolic Panel: Recent Labs    06/27/17 1135  07/13/17  08/31/17  11/02/17 12/07/17 01/04/18 01/30/18  NA 128*   < > 128*  128   < > 127   < > 130* 130* 131 131*  K 4.2   < > 3.9  3.9   < > 4.1   < > 4.0 3.9 3.8 4.0  CL 97*  --  97  --   --   --  96 95  --   --   CO2 23  --  24  --   --   --  27 28  --   --   GLUCOSE 114*  --   --   --   --   --   --   --   --   --   BUN 23*   < > 21  21   < > 24*   < > 23* 24* 31* 34*  CREATININE 1.06*   < > 1.0  0.98   < > 1.05   < > 1.1 1.0 1.12 1.1  CALCIUM 8.6*  --  8.2  8.2   < >  8.7   < > 9.1 8.9 8.8  --   TSH  --   --   --   --  1.57  --   --   --  1.71  --    < > = values in this interval not displayed.   Liver Function Tests: Recent Labs    08/31/17 10/19/17 01/30/18  AST 20 28 18   ALT 1.57 16 12  ALKPHOS 53 76 53  BILITOT 0.8 0.8  --   PROT 5.5  6.7  --   ALBUMIN 3.3 3.8  --    Recent Labs    10/30/17  LIPASE 30   No results for input(s): AMMONIA in the last 8760 hours. CBC: Recent Labs    06/27/17 1135 07/03/17  08/31/17 10/24/17 01/04/18  WBC 5.2 6.2   < > 7 5.2 4.7  HGB 12.8 12.0   < > 11.4 11.8* 11.8  HCT 36.9 34*   < > 32.1 33* 34.6  MCV 97.9  --   --   --   --  98.6  PLT 172 188  --   --  173  --    < > = values in this interval not displayed.   Lipid Panel: No results for input(s): CHOL, HDL, LDLCALC, TRIG, CHOLHDL, LDLDIRECT in the last 8760 hours. No results found for: HGBA1C  Procedures since last visit: No results found.  Assessment/Plan 1. Benign hypertension with CKD (chronic kidney disease) stage III (HCC) Increase Clonidine to .1 mg BID and follow  2. Primary osteoarthritis involving multiple joints Injected L kneee as described above.  If injection helps pain will plan to do R knee in near future   Labs/tests ordered:  @ORDERS @ Next appt:  Visit date not found  Lillette Boxer. Sabra Heck, [place] [address] [place], [place] Office [phone]

## 2018-04-17 DIAGNOSIS — N182 Chronic kidney disease, stage 2 (mild): Secondary | ICD-10-CM | POA: Diagnosis not present

## 2018-04-17 DIAGNOSIS — R42 Dizziness and giddiness: Secondary | ICD-10-CM | POA: Diagnosis not present

## 2018-04-17 DIAGNOSIS — R2681 Unsteadiness on feet: Secondary | ICD-10-CM | POA: Diagnosis not present

## 2018-04-17 DIAGNOSIS — S72141A Displaced intertrochanteric fracture of right femur, initial encounter for closed fracture: Secondary | ICD-10-CM | POA: Diagnosis not present

## 2018-04-17 DIAGNOSIS — M6281 Muscle weakness (generalized): Secondary | ICD-10-CM | POA: Diagnosis not present

## 2018-04-17 DIAGNOSIS — S329XXA Fracture of unspecified parts of lumbosacral spine and pelvis, initial encounter for closed fracture: Secondary | ICD-10-CM | POA: Diagnosis not present

## 2018-04-18 DIAGNOSIS — N182 Chronic kidney disease, stage 2 (mild): Secondary | ICD-10-CM | POA: Diagnosis not present

## 2018-04-18 DIAGNOSIS — S329XXA Fracture of unspecified parts of lumbosacral spine and pelvis, initial encounter for closed fracture: Secondary | ICD-10-CM | POA: Diagnosis not present

## 2018-04-18 DIAGNOSIS — S72141A Displaced intertrochanteric fracture of right femur, initial encounter for closed fracture: Secondary | ICD-10-CM | POA: Diagnosis not present

## 2018-04-18 DIAGNOSIS — R42 Dizziness and giddiness: Secondary | ICD-10-CM | POA: Diagnosis not present

## 2018-04-18 DIAGNOSIS — R2681 Unsteadiness on feet: Secondary | ICD-10-CM | POA: Diagnosis not present

## 2018-04-18 DIAGNOSIS — M6281 Muscle weakness (generalized): Secondary | ICD-10-CM | POA: Diagnosis not present

## 2018-04-19 DIAGNOSIS — S329XXA Fracture of unspecified parts of lumbosacral spine and pelvis, initial encounter for closed fracture: Secondary | ICD-10-CM | POA: Diagnosis not present

## 2018-04-19 DIAGNOSIS — N182 Chronic kidney disease, stage 2 (mild): Secondary | ICD-10-CM | POA: Diagnosis not present

## 2018-04-19 DIAGNOSIS — R42 Dizziness and giddiness: Secondary | ICD-10-CM | POA: Diagnosis not present

## 2018-04-19 DIAGNOSIS — M6281 Muscle weakness (generalized): Secondary | ICD-10-CM | POA: Diagnosis not present

## 2018-04-19 DIAGNOSIS — R2681 Unsteadiness on feet: Secondary | ICD-10-CM | POA: Diagnosis not present

## 2018-04-19 DIAGNOSIS — S72141A Displaced intertrochanteric fracture of right femur, initial encounter for closed fracture: Secondary | ICD-10-CM | POA: Diagnosis not present

## 2018-04-20 DIAGNOSIS — N182 Chronic kidney disease, stage 2 (mild): Secondary | ICD-10-CM | POA: Diagnosis not present

## 2018-04-20 DIAGNOSIS — R2681 Unsteadiness on feet: Secondary | ICD-10-CM | POA: Diagnosis not present

## 2018-04-20 DIAGNOSIS — S72141A Displaced intertrochanteric fracture of right femur, initial encounter for closed fracture: Secondary | ICD-10-CM | POA: Diagnosis not present

## 2018-04-20 DIAGNOSIS — R42 Dizziness and giddiness: Secondary | ICD-10-CM | POA: Diagnosis not present

## 2018-04-20 DIAGNOSIS — M6281 Muscle weakness (generalized): Secondary | ICD-10-CM | POA: Diagnosis not present

## 2018-04-20 DIAGNOSIS — S329XXA Fracture of unspecified parts of lumbosacral spine and pelvis, initial encounter for closed fracture: Secondary | ICD-10-CM | POA: Diagnosis not present

## 2018-04-23 DIAGNOSIS — S72141A Displaced intertrochanteric fracture of right femur, initial encounter for closed fracture: Secondary | ICD-10-CM | POA: Diagnosis not present

## 2018-04-23 DIAGNOSIS — M6281 Muscle weakness (generalized): Secondary | ICD-10-CM | POA: Diagnosis not present

## 2018-04-23 DIAGNOSIS — N182 Chronic kidney disease, stage 2 (mild): Secondary | ICD-10-CM | POA: Diagnosis not present

## 2018-04-23 DIAGNOSIS — S329XXA Fracture of unspecified parts of lumbosacral spine and pelvis, initial encounter for closed fracture: Secondary | ICD-10-CM | POA: Diagnosis not present

## 2018-04-23 DIAGNOSIS — R2681 Unsteadiness on feet: Secondary | ICD-10-CM | POA: Diagnosis not present

## 2018-04-23 DIAGNOSIS — R42 Dizziness and giddiness: Secondary | ICD-10-CM | POA: Diagnosis not present

## 2018-04-24 DIAGNOSIS — R2681 Unsteadiness on feet: Secondary | ICD-10-CM | POA: Diagnosis not present

## 2018-04-24 DIAGNOSIS — S329XXA Fracture of unspecified parts of lumbosacral spine and pelvis, initial encounter for closed fracture: Secondary | ICD-10-CM | POA: Diagnosis not present

## 2018-04-24 DIAGNOSIS — R42 Dizziness and giddiness: Secondary | ICD-10-CM | POA: Diagnosis not present

## 2018-04-24 DIAGNOSIS — M6281 Muscle weakness (generalized): Secondary | ICD-10-CM | POA: Diagnosis not present

## 2018-04-24 DIAGNOSIS — S72141A Displaced intertrochanteric fracture of right femur, initial encounter for closed fracture: Secondary | ICD-10-CM | POA: Diagnosis not present

## 2018-04-24 DIAGNOSIS — N182 Chronic kidney disease, stage 2 (mild): Secondary | ICD-10-CM | POA: Diagnosis not present

## 2018-04-26 DIAGNOSIS — S72141A Displaced intertrochanteric fracture of right femur, initial encounter for closed fracture: Secondary | ICD-10-CM | POA: Diagnosis not present

## 2018-04-26 DIAGNOSIS — S329XXA Fracture of unspecified parts of lumbosacral spine and pelvis, initial encounter for closed fracture: Secondary | ICD-10-CM | POA: Diagnosis not present

## 2018-04-26 DIAGNOSIS — R2681 Unsteadiness on feet: Secondary | ICD-10-CM | POA: Diagnosis not present

## 2018-04-26 DIAGNOSIS — N182 Chronic kidney disease, stage 2 (mild): Secondary | ICD-10-CM | POA: Diagnosis not present

## 2018-04-26 DIAGNOSIS — M6281 Muscle weakness (generalized): Secondary | ICD-10-CM | POA: Diagnosis not present

## 2018-04-26 DIAGNOSIS — R42 Dizziness and giddiness: Secondary | ICD-10-CM | POA: Diagnosis not present

## 2018-04-27 DIAGNOSIS — R2681 Unsteadiness on feet: Secondary | ICD-10-CM | POA: Diagnosis not present

## 2018-04-27 DIAGNOSIS — S329XXA Fracture of unspecified parts of lumbosacral spine and pelvis, initial encounter for closed fracture: Secondary | ICD-10-CM | POA: Diagnosis not present

## 2018-04-27 DIAGNOSIS — M6281 Muscle weakness (generalized): Secondary | ICD-10-CM | POA: Diagnosis not present

## 2018-04-27 DIAGNOSIS — N182 Chronic kidney disease, stage 2 (mild): Secondary | ICD-10-CM | POA: Diagnosis not present

## 2018-04-27 DIAGNOSIS — S72141A Displaced intertrochanteric fracture of right femur, initial encounter for closed fracture: Secondary | ICD-10-CM | POA: Diagnosis not present

## 2018-04-27 DIAGNOSIS — R42 Dizziness and giddiness: Secondary | ICD-10-CM | POA: Diagnosis not present

## 2018-05-01 DIAGNOSIS — N182 Chronic kidney disease, stage 2 (mild): Secondary | ICD-10-CM | POA: Diagnosis not present

## 2018-05-01 DIAGNOSIS — R42 Dizziness and giddiness: Secondary | ICD-10-CM | POA: Diagnosis not present

## 2018-05-01 DIAGNOSIS — S72141A Displaced intertrochanteric fracture of right femur, initial encounter for closed fracture: Secondary | ICD-10-CM | POA: Diagnosis not present

## 2018-05-01 DIAGNOSIS — M6281 Muscle weakness (generalized): Secondary | ICD-10-CM | POA: Diagnosis not present

## 2018-05-01 DIAGNOSIS — R2681 Unsteadiness on feet: Secondary | ICD-10-CM | POA: Diagnosis not present

## 2018-05-01 DIAGNOSIS — S329XXA Fracture of unspecified parts of lumbosacral spine and pelvis, initial encounter for closed fracture: Secondary | ICD-10-CM | POA: Diagnosis not present

## 2018-05-02 DIAGNOSIS — R2681 Unsteadiness on feet: Secondary | ICD-10-CM | POA: Diagnosis not present

## 2018-05-02 DIAGNOSIS — M6281 Muscle weakness (generalized): Secondary | ICD-10-CM | POA: Diagnosis not present

## 2018-05-02 DIAGNOSIS — R42 Dizziness and giddiness: Secondary | ICD-10-CM | POA: Diagnosis not present

## 2018-05-02 DIAGNOSIS — S329XXA Fracture of unspecified parts of lumbosacral spine and pelvis, initial encounter for closed fracture: Secondary | ICD-10-CM | POA: Diagnosis not present

## 2018-05-02 DIAGNOSIS — N182 Chronic kidney disease, stage 2 (mild): Secondary | ICD-10-CM | POA: Diagnosis not present

## 2018-05-02 DIAGNOSIS — S72141A Displaced intertrochanteric fracture of right femur, initial encounter for closed fracture: Secondary | ICD-10-CM | POA: Diagnosis not present

## 2018-05-04 DIAGNOSIS — S72141A Displaced intertrochanteric fracture of right femur, initial encounter for closed fracture: Secondary | ICD-10-CM | POA: Diagnosis not present

## 2018-05-04 DIAGNOSIS — N182 Chronic kidney disease, stage 2 (mild): Secondary | ICD-10-CM | POA: Diagnosis not present

## 2018-05-04 DIAGNOSIS — R2681 Unsteadiness on feet: Secondary | ICD-10-CM | POA: Diagnosis not present

## 2018-05-04 DIAGNOSIS — M6281 Muscle weakness (generalized): Secondary | ICD-10-CM | POA: Diagnosis not present

## 2018-05-04 DIAGNOSIS — S329XXA Fracture of unspecified parts of lumbosacral spine and pelvis, initial encounter for closed fracture: Secondary | ICD-10-CM | POA: Diagnosis not present

## 2018-05-04 DIAGNOSIS — R42 Dizziness and giddiness: Secondary | ICD-10-CM | POA: Diagnosis not present

## 2018-05-06 DIAGNOSIS — R2681 Unsteadiness on feet: Secondary | ICD-10-CM | POA: Diagnosis not present

## 2018-05-06 DIAGNOSIS — S72141A Displaced intertrochanteric fracture of right femur, initial encounter for closed fracture: Secondary | ICD-10-CM | POA: Diagnosis not present

## 2018-05-06 DIAGNOSIS — M6281 Muscle weakness (generalized): Secondary | ICD-10-CM | POA: Diagnosis not present

## 2018-05-06 DIAGNOSIS — S329XXA Fracture of unspecified parts of lumbosacral spine and pelvis, initial encounter for closed fracture: Secondary | ICD-10-CM | POA: Diagnosis not present

## 2018-05-06 DIAGNOSIS — R42 Dizziness and giddiness: Secondary | ICD-10-CM | POA: Diagnosis not present

## 2018-05-06 DIAGNOSIS — N182 Chronic kidney disease, stage 2 (mild): Secondary | ICD-10-CM | POA: Diagnosis not present

## 2018-05-07 DIAGNOSIS — R42 Dizziness and giddiness: Secondary | ICD-10-CM | POA: Diagnosis not present

## 2018-05-07 DIAGNOSIS — S72141A Displaced intertrochanteric fracture of right femur, initial encounter for closed fracture: Secondary | ICD-10-CM | POA: Diagnosis not present

## 2018-05-07 DIAGNOSIS — N182 Chronic kidney disease, stage 2 (mild): Secondary | ICD-10-CM | POA: Diagnosis not present

## 2018-05-07 DIAGNOSIS — M6281 Muscle weakness (generalized): Secondary | ICD-10-CM | POA: Diagnosis not present

## 2018-05-07 DIAGNOSIS — R2681 Unsteadiness on feet: Secondary | ICD-10-CM | POA: Diagnosis not present

## 2018-05-07 DIAGNOSIS — S329XXA Fracture of unspecified parts of lumbosacral spine and pelvis, initial encounter for closed fracture: Secondary | ICD-10-CM | POA: Diagnosis not present

## 2018-05-08 DIAGNOSIS — S72141A Displaced intertrochanteric fracture of right femur, initial encounter for closed fracture: Secondary | ICD-10-CM | POA: Diagnosis not present

## 2018-05-08 DIAGNOSIS — N182 Chronic kidney disease, stage 2 (mild): Secondary | ICD-10-CM | POA: Diagnosis not present

## 2018-05-08 DIAGNOSIS — M6281 Muscle weakness (generalized): Secondary | ICD-10-CM | POA: Diagnosis not present

## 2018-05-08 DIAGNOSIS — S329XXA Fracture of unspecified parts of lumbosacral spine and pelvis, initial encounter for closed fracture: Secondary | ICD-10-CM | POA: Diagnosis not present

## 2018-05-08 DIAGNOSIS — R2681 Unsteadiness on feet: Secondary | ICD-10-CM | POA: Diagnosis not present

## 2018-05-08 DIAGNOSIS — R42 Dizziness and giddiness: Secondary | ICD-10-CM | POA: Diagnosis not present

## 2018-05-10 DIAGNOSIS — S329XXA Fracture of unspecified parts of lumbosacral spine and pelvis, initial encounter for closed fracture: Secondary | ICD-10-CM | POA: Diagnosis not present

## 2018-05-10 DIAGNOSIS — M6281 Muscle weakness (generalized): Secondary | ICD-10-CM | POA: Diagnosis not present

## 2018-05-10 DIAGNOSIS — S72141A Displaced intertrochanteric fracture of right femur, initial encounter for closed fracture: Secondary | ICD-10-CM | POA: Diagnosis not present

## 2018-05-10 DIAGNOSIS — R2681 Unsteadiness on feet: Secondary | ICD-10-CM | POA: Diagnosis not present

## 2018-05-10 DIAGNOSIS — N182 Chronic kidney disease, stage 2 (mild): Secondary | ICD-10-CM | POA: Diagnosis not present

## 2018-05-10 DIAGNOSIS — R42 Dizziness and giddiness: Secondary | ICD-10-CM | POA: Diagnosis not present

## 2018-05-14 DIAGNOSIS — M6281 Muscle weakness (generalized): Secondary | ICD-10-CM | POA: Diagnosis not present

## 2018-05-14 DIAGNOSIS — N182 Chronic kidney disease, stage 2 (mild): Secondary | ICD-10-CM | POA: Diagnosis not present

## 2018-05-14 DIAGNOSIS — R42 Dizziness and giddiness: Secondary | ICD-10-CM | POA: Diagnosis not present

## 2018-05-14 DIAGNOSIS — S329XXA Fracture of unspecified parts of lumbosacral spine and pelvis, initial encounter for closed fracture: Secondary | ICD-10-CM | POA: Diagnosis not present

## 2018-05-14 DIAGNOSIS — S72141A Displaced intertrochanteric fracture of right femur, initial encounter for closed fracture: Secondary | ICD-10-CM | POA: Diagnosis not present

## 2018-05-14 DIAGNOSIS — R2681 Unsteadiness on feet: Secondary | ICD-10-CM | POA: Diagnosis not present

## 2018-05-16 DIAGNOSIS — C50919 Malignant neoplasm of unspecified site of unspecified female breast: Secondary | ICD-10-CM | POA: Diagnosis not present

## 2018-05-16 DIAGNOSIS — S72001A Fracture of unspecified part of neck of right femur, initial encounter for closed fracture: Secondary | ICD-10-CM | POA: Diagnosis not present

## 2018-05-16 DIAGNOSIS — M1610 Unilateral primary osteoarthritis, unspecified hip: Secondary | ICD-10-CM | POA: Diagnosis not present

## 2018-05-16 DIAGNOSIS — N182 Chronic kidney disease, stage 2 (mild): Secondary | ICD-10-CM | POA: Diagnosis not present

## 2018-05-16 DIAGNOSIS — G4700 Insomnia, unspecified: Secondary | ICD-10-CM | POA: Diagnosis not present

## 2018-05-16 DIAGNOSIS — E7800 Pure hypercholesterolemia, unspecified: Secondary | ICD-10-CM | POA: Diagnosis not present

## 2018-05-16 DIAGNOSIS — Z4789 Encounter for other orthopedic aftercare: Secondary | ICD-10-CM | POA: Diagnosis not present

## 2018-05-16 DIAGNOSIS — E039 Hypothyroidism, unspecified: Secondary | ICD-10-CM | POA: Diagnosis not present

## 2018-05-16 DIAGNOSIS — D649 Anemia, unspecified: Secondary | ICD-10-CM | POA: Diagnosis not present

## 2018-05-16 DIAGNOSIS — D500 Iron deficiency anemia secondary to blood loss (chronic): Secondary | ICD-10-CM | POA: Diagnosis not present

## 2018-05-16 DIAGNOSIS — S329XXA Fracture of unspecified parts of lumbosacral spine and pelvis, initial encounter for closed fracture: Secondary | ICD-10-CM | POA: Diagnosis not present

## 2018-05-16 DIAGNOSIS — M1991 Primary osteoarthritis, unspecified site: Secondary | ICD-10-CM | POA: Diagnosis not present

## 2018-05-16 DIAGNOSIS — M6281 Muscle weakness (generalized): Secondary | ICD-10-CM | POA: Diagnosis not present

## 2018-05-16 DIAGNOSIS — R42 Dizziness and giddiness: Secondary | ICD-10-CM | POA: Diagnosis not present

## 2018-05-16 DIAGNOSIS — W19XXXA Unspecified fall, initial encounter: Secondary | ICD-10-CM | POA: Diagnosis not present

## 2018-05-16 DIAGNOSIS — R2681 Unsteadiness on feet: Secondary | ICD-10-CM | POA: Diagnosis not present

## 2018-05-16 DIAGNOSIS — M810 Age-related osteoporosis without current pathological fracture: Secondary | ICD-10-CM | POA: Diagnosis not present

## 2018-05-16 DIAGNOSIS — R609 Edema, unspecified: Secondary | ICD-10-CM | POA: Diagnosis not present

## 2018-05-16 DIAGNOSIS — I10 Essential (primary) hypertension: Secondary | ICD-10-CM | POA: Diagnosis not present

## 2018-05-22 DIAGNOSIS — S329XXA Fracture of unspecified parts of lumbosacral spine and pelvis, initial encounter for closed fracture: Secondary | ICD-10-CM | POA: Diagnosis not present

## 2018-05-22 DIAGNOSIS — R2681 Unsteadiness on feet: Secondary | ICD-10-CM | POA: Diagnosis not present

## 2018-05-22 DIAGNOSIS — N182 Chronic kidney disease, stage 2 (mild): Secondary | ICD-10-CM | POA: Diagnosis not present

## 2018-05-22 DIAGNOSIS — M6281 Muscle weakness (generalized): Secondary | ICD-10-CM | POA: Diagnosis not present

## 2018-05-22 DIAGNOSIS — R42 Dizziness and giddiness: Secondary | ICD-10-CM | POA: Diagnosis not present

## 2018-05-22 DIAGNOSIS — W19XXXA Unspecified fall, initial encounter: Secondary | ICD-10-CM | POA: Diagnosis not present

## 2018-05-23 ENCOUNTER — Other Ambulatory Visit: Payer: Self-pay

## 2018-05-23 MED ORDER — OXYCODONE-ACETAMINOPHEN 10-325 MG PO TABS: 1.0000 | ORAL_TABLET | Freq: Four times a day (QID) | ORAL | 0 refills | Status: DC

## 2018-05-24 DIAGNOSIS — R2681 Unsteadiness on feet: Secondary | ICD-10-CM | POA: Diagnosis not present

## 2018-05-24 DIAGNOSIS — N182 Chronic kidney disease, stage 2 (mild): Secondary | ICD-10-CM | POA: Diagnosis not present

## 2018-05-24 DIAGNOSIS — R42 Dizziness and giddiness: Secondary | ICD-10-CM | POA: Diagnosis not present

## 2018-05-24 DIAGNOSIS — M6281 Muscle weakness (generalized): Secondary | ICD-10-CM | POA: Diagnosis not present

## 2018-05-24 DIAGNOSIS — S329XXA Fracture of unspecified parts of lumbosacral spine and pelvis, initial encounter for closed fracture: Secondary | ICD-10-CM | POA: Diagnosis not present

## 2018-05-24 DIAGNOSIS — W19XXXA Unspecified fall, initial encounter: Secondary | ICD-10-CM | POA: Diagnosis not present

## 2018-05-29 DIAGNOSIS — R42 Dizziness and giddiness: Secondary | ICD-10-CM | POA: Diagnosis not present

## 2018-05-29 DIAGNOSIS — W19XXXA Unspecified fall, initial encounter: Secondary | ICD-10-CM | POA: Diagnosis not present

## 2018-05-29 DIAGNOSIS — R2681 Unsteadiness on feet: Secondary | ICD-10-CM | POA: Diagnosis not present

## 2018-05-29 DIAGNOSIS — M6281 Muscle weakness (generalized): Secondary | ICD-10-CM | POA: Diagnosis not present

## 2018-05-29 DIAGNOSIS — N182 Chronic kidney disease, stage 2 (mild): Secondary | ICD-10-CM | POA: Diagnosis not present

## 2018-05-29 DIAGNOSIS — S329XXA Fracture of unspecified parts of lumbosacral spine and pelvis, initial encounter for closed fracture: Secondary | ICD-10-CM | POA: Diagnosis not present

## 2018-05-31 DIAGNOSIS — W19XXXA Unspecified fall, initial encounter: Secondary | ICD-10-CM | POA: Diagnosis not present

## 2018-05-31 DIAGNOSIS — R2681 Unsteadiness on feet: Secondary | ICD-10-CM | POA: Diagnosis not present

## 2018-05-31 DIAGNOSIS — M6281 Muscle weakness (generalized): Secondary | ICD-10-CM | POA: Diagnosis not present

## 2018-05-31 DIAGNOSIS — S329XXA Fracture of unspecified parts of lumbosacral spine and pelvis, initial encounter for closed fracture: Secondary | ICD-10-CM | POA: Diagnosis not present

## 2018-05-31 DIAGNOSIS — R42 Dizziness and giddiness: Secondary | ICD-10-CM | POA: Diagnosis not present

## 2018-05-31 DIAGNOSIS — N182 Chronic kidney disease, stage 2 (mild): Secondary | ICD-10-CM | POA: Diagnosis not present

## 2018-06-01 ENCOUNTER — Other Ambulatory Visit: Payer: Self-pay | Admitting: *Deleted

## 2018-06-01 DIAGNOSIS — G894 Chronic pain syndrome: Secondary | ICD-10-CM

## 2018-06-01 MED ORDER — OXYCODONE-ACETAMINOPHEN 10-325 MG PO TABS: 1.0000 | ORAL_TABLET | Freq: Four times a day (QID) | ORAL | 0 refills | Status: DC

## 2018-06-05 ENCOUNTER — Other Ambulatory Visit: Payer: Self-pay | Admitting: *Deleted

## 2018-06-05 DIAGNOSIS — R2681 Unsteadiness on feet: Secondary | ICD-10-CM | POA: Diagnosis not present

## 2018-06-05 DIAGNOSIS — G894 Chronic pain syndrome: Secondary | ICD-10-CM

## 2018-06-05 DIAGNOSIS — W19XXXA Unspecified fall, initial encounter: Secondary | ICD-10-CM | POA: Diagnosis not present

## 2018-06-05 DIAGNOSIS — N182 Chronic kidney disease, stage 2 (mild): Secondary | ICD-10-CM | POA: Diagnosis not present

## 2018-06-05 DIAGNOSIS — M6281 Muscle weakness (generalized): Secondary | ICD-10-CM | POA: Diagnosis not present

## 2018-06-05 DIAGNOSIS — R42 Dizziness and giddiness: Secondary | ICD-10-CM | POA: Diagnosis not present

## 2018-06-05 DIAGNOSIS — S329XXA Fracture of unspecified parts of lumbosacral spine and pelvis, initial encounter for closed fracture: Secondary | ICD-10-CM | POA: Diagnosis not present

## 2018-06-05 MED ORDER — OXYCODONE-ACETAMINOPHEN 10-325 MG PO TABS: 1.0000 | ORAL_TABLET | ORAL | 0 refills | Status: DC | PRN

## 2018-06-05 MED ORDER — OXYCODONE-ACETAMINOPHEN 10-325 MG PO TABS: 1.0000 | ORAL_TABLET | Freq: Four times a day (QID) | ORAL | 0 refills | Status: AC

## 2018-06-07 DIAGNOSIS — R42 Dizziness and giddiness: Secondary | ICD-10-CM | POA: Diagnosis not present

## 2018-06-07 DIAGNOSIS — M6281 Muscle weakness (generalized): Secondary | ICD-10-CM | POA: Diagnosis not present

## 2018-06-07 DIAGNOSIS — S329XXA Fracture of unspecified parts of lumbosacral spine and pelvis, initial encounter for closed fracture: Secondary | ICD-10-CM | POA: Diagnosis not present

## 2018-06-07 DIAGNOSIS — R2681 Unsteadiness on feet: Secondary | ICD-10-CM | POA: Diagnosis not present

## 2018-06-07 DIAGNOSIS — N182 Chronic kidney disease, stage 2 (mild): Secondary | ICD-10-CM | POA: Diagnosis not present

## 2018-06-07 DIAGNOSIS — W19XXXA Unspecified fall, initial encounter: Secondary | ICD-10-CM | POA: Diagnosis not present

## 2018-06-08 ENCOUNTER — Encounter: Payer: Self-pay | Admitting: Internal Medicine

## 2018-06-11 DIAGNOSIS — W19XXXA Unspecified fall, initial encounter: Secondary | ICD-10-CM | POA: Diagnosis not present

## 2018-06-11 DIAGNOSIS — R2681 Unsteadiness on feet: Secondary | ICD-10-CM | POA: Diagnosis not present

## 2018-06-11 DIAGNOSIS — S329XXA Fracture of unspecified parts of lumbosacral spine and pelvis, initial encounter for closed fracture: Secondary | ICD-10-CM | POA: Diagnosis not present

## 2018-06-11 DIAGNOSIS — R42 Dizziness and giddiness: Secondary | ICD-10-CM | POA: Diagnosis not present

## 2018-06-11 DIAGNOSIS — N182 Chronic kidney disease, stage 2 (mild): Secondary | ICD-10-CM | POA: Diagnosis not present

## 2018-06-11 DIAGNOSIS — M6281 Muscle weakness (generalized): Secondary | ICD-10-CM | POA: Diagnosis not present

## 2018-06-13 DIAGNOSIS — N182 Chronic kidney disease, stage 2 (mild): Secondary | ICD-10-CM | POA: Diagnosis not present

## 2018-06-13 DIAGNOSIS — R2681 Unsteadiness on feet: Secondary | ICD-10-CM | POA: Diagnosis not present

## 2018-06-13 DIAGNOSIS — W19XXXA Unspecified fall, initial encounter: Secondary | ICD-10-CM | POA: Diagnosis not present

## 2018-06-13 DIAGNOSIS — S329XXA Fracture of unspecified parts of lumbosacral spine and pelvis, initial encounter for closed fracture: Secondary | ICD-10-CM | POA: Diagnosis not present

## 2018-06-13 DIAGNOSIS — R42 Dizziness and giddiness: Secondary | ICD-10-CM | POA: Diagnosis not present

## 2018-06-13 DIAGNOSIS — M6281 Muscle weakness (generalized): Secondary | ICD-10-CM | POA: Diagnosis not present

## 2018-06-18 DIAGNOSIS — R609 Edema, unspecified: Secondary | ICD-10-CM | POA: Diagnosis not present

## 2018-06-18 DIAGNOSIS — M6281 Muscle weakness (generalized): Secondary | ICD-10-CM | POA: Diagnosis not present

## 2018-06-18 DIAGNOSIS — R2681 Unsteadiness on feet: Secondary | ICD-10-CM | POA: Diagnosis not present

## 2018-06-20 DIAGNOSIS — R2681 Unsteadiness on feet: Secondary | ICD-10-CM | POA: Diagnosis not present

## 2018-06-20 DIAGNOSIS — M6281 Muscle weakness (generalized): Secondary | ICD-10-CM | POA: Diagnosis not present

## 2018-06-20 DIAGNOSIS — R609 Edema, unspecified: Secondary | ICD-10-CM | POA: Diagnosis not present

## 2018-06-25 DIAGNOSIS — R609 Edema, unspecified: Secondary | ICD-10-CM | POA: Diagnosis not present

## 2018-06-25 DIAGNOSIS — M6281 Muscle weakness (generalized): Secondary | ICD-10-CM | POA: Diagnosis not present

## 2018-06-25 DIAGNOSIS — R2681 Unsteadiness on feet: Secondary | ICD-10-CM | POA: Diagnosis not present

## 2018-06-27 DIAGNOSIS — M6281 Muscle weakness (generalized): Secondary | ICD-10-CM | POA: Diagnosis not present

## 2018-06-27 DIAGNOSIS — R2681 Unsteadiness on feet: Secondary | ICD-10-CM | POA: Diagnosis not present

## 2018-06-27 DIAGNOSIS — R609 Edema, unspecified: Secondary | ICD-10-CM | POA: Diagnosis not present

## 2018-06-29 ENCOUNTER — Encounter: Payer: Self-pay | Admitting: Internal Medicine

## 2018-06-29 ENCOUNTER — Non-Acute Institutional Stay: Payer: Medicare Other | Admitting: Internal Medicine

## 2018-06-29 DIAGNOSIS — E039 Hypothyroidism, unspecified: Secondary | ICD-10-CM

## 2018-06-29 DIAGNOSIS — M150 Primary generalized (osteo)arthritis: Secondary | ICD-10-CM

## 2018-06-29 DIAGNOSIS — M159 Polyosteoarthritis, unspecified: Secondary | ICD-10-CM

## 2018-06-29 DIAGNOSIS — I10 Essential (primary) hypertension: Secondary | ICD-10-CM

## 2018-06-29 DIAGNOSIS — F418 Other specified anxiety disorders: Secondary | ICD-10-CM | POA: Diagnosis not present

## 2018-06-29 DIAGNOSIS — Z86711 Personal history of pulmonary embolism: Secondary | ICD-10-CM | POA: Diagnosis not present

## 2018-06-29 NOTE — Progress Notes (Signed)
Location:  [place] Room Number: 35 Place of Service:  ALF (13) Provider:    Mast, Man X, NP  Patient Care Team: Mast, Man X, NP as PCP - General (Nurse Practitioner)  Extended Emergency Contact Information Primary Emergency Contact: Brisbane,[name] Address: [address] [place], [place] [address] [place] Mobile Phone: [phone] Relation: Son  Code Status:  DNR Goals of care: Advanced Directive information Advanced Directives 06/29/2018  Does Patient Have a Medical Advance Directive? Yes  Type of Paramedic of [place];Out of facility DNR (pink MOST or yellow form)  Does patient want to make changes to medical advance directive? No - Patient declined  Copy of [place] in Chart? Yes - validated most recent copy scanned in chart (See row information)  Would patient like information on creating a medical advance directive? -  Pre-existing out of facility DNR order (yellow form or pink MOST form) Yellow form placed in chart (order not valid for inpatient use);Pink MOST form placed in chart (order not valid for inpatient use)     Chief Complaint  Patient presents with  . Acute Visit    Increased arthritic pain    HPI:  Pt is a 85 y.o. female seen today for an acute visit for Arthritic Pain in her Legs She is a resident in assisted living and [facility] She has a history of uncontrolled hypertension, severe arthritis depression with anxiety.H/o PE in 2016,  Patient has h/o Chronic Knee Pains due to arthritis. Though I was unable to Find any Imanging studies done for past few years. She is on 10/325 mg of Percocet 4 times a day and and Extra Percocet  once a day as needed for// breakthrough pain.  She also has lidocaine patches. And Tyelenol Per nurses yesterday she had such a severe pain and they did not have any PRN orders. When patient gets pain like this she her blood pressure goes up.   There is some documented blood pressures of 200/100.  She is on a number of blood pressure medications. Saw patient in her room.  She her main complaint continues to be knee pain.  And she was worried about her blood pressure. Patient also is mildly depressed she lost her husband 2 years ago and was very tearful during my exam. Patient does walk with a walker and is independent in her transfers Denies any Chest Pian or SOB     Past Medical History:  Diagnosis Date  . Acquired cyst of kidney    left  . Allergic rhinitis, cause unspecified   . Anemia, unspecified   . Chronic hyponatremia 09/05/2015  . CKD stage 2 due to type 2 diabetes mellitus ([place]) 11/18/2014  . Closed fracture of unspecified part of vertebral column without mention of spinal cord injury    T7 s/p kyphoplasty, T12  . Edema 2015  . Herpes simplex without mention of complication   . History of Epstein-Barr virus infection   . Hyposmolality and/or hyponatremia    07/13/15 Na 131  . Insomnia   . Insomnia, unspecified   . Left cavernous carotid aneurysm 08/2008   1-1/32mm aneurysm of cavernous carotid artery  . Malignant neoplasm of breast (female), unspecified site 1990   left. Had surgerey and chemo, but no radiation  . 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 Neuropathy   .  Osteoarthrosis of knee    bilaterally  . Osteoarthrosis, hip   . Osteoarthrosis, unspecified whether generalized or localized, forearm    bilaterally wrist  . Other and unspecified nonspecific immunological findings    positive ANA  . Other B-complex deficiencies   . Pain in joint, site unspecified    severe, diffuse, chronic pain  . Positive ANA (antinuclear antibody)   . Pulmonary embolism ([facility]) 05/07/2015  . Pure hypercholesterolemia   . Senile osteoporosis   . Unspecified essential hypertension   . Unspecified gastritis and gastroduodenitis without mention of hemorrhage   . Unspecified  hypothyroidism   . Unspecified vitamin D deficiency    Past Surgical History:  Procedure Laterality Date  . BREAST SURGERY Left   . DILATATION & CURETTAGE/HYSTEROSCOPY WITH MYOSURE N/A 06/27/2017   Procedure: DILATATION & CURETTAGE/HYSTEROSCOPY WITH MYOSURE;  Surgeon: Servando Salina, MD;  Location: [facility];  Service: Gynecology;  Laterality: N/A;  . DILATION AND CURETTAGE OF UTERUS  X 2  . FEMUR IM NAIL Right 03/18/2015   Procedure: INTRAMEDULLARY (IM) NAIL FEMORAL;  Surgeon: Rod Can, MD;  Location: [facility];  Service: Orthopedics;  Laterality: Right;  . FRACTURE SURGERY     hip  . KYPHOPLASTY  07/03/2010   T12  . KYPHOPLASTY     C7  . LAPAROSCOPIC CHOLECYSTECTOMY  09/20/2006  . MASTECTOMY Left 04/29/1989  . MIDDLE EAR SURGERY Bilateral 1938  . NASAL SINUS SURGERY  1990 & 2000  . ORIF HIP FRACTURE Left 06/13/2007   following a syncopal episode  . TONSILLECTOMY  1968    Allergies  Allergen Reactions  . Aspirin     Drop in body temp with large quantities, can tolerate low doses of aspirin  . Penicillins Swelling    Has patient had a PCN reaction causing immediate rash, facial/tongue/throat swelling, SOB or lightheadedness with hypotension: No Has patient had a PCN reaction causing severe rash involving mucus membranes or skin necrosis: No Has patient had a PCN reaction that required hospitalization No Has patient had a PCN reaction occurring within the last 10 years: No If all of the above answers are "NO", then may proceed with Cephalosporin use.    Outpatient Encounter Medications as of 06/29/2018  Medication Sig  . acetaminophen (TYLENOL) 500 MG tablet Take 1,000 mg by mouth at bedtime.   Marland Kitchen acetaminophen (TYLENOL) 500 MG tablet Take 500 mg by mouth every morning.   Marland Kitchen atenolol (TENORMIN) 100 MG tablet Take 100 mg by mouth daily. Hold if SBP less than 110 or HR less than 60 bpm; if SBP greater than 160 chart in vital signs  . cloNIDine (CATAPRES) 0.1 MG tablet Take 0.1 mg  by mouth 2 (two) times daily as needed.  . famotidine (PEPCID) 10 MG tablet Take 10 mg by mouth at bedtime.  . feeding supplement (BOOST / RESOURCE BREEZE) LIQD Take 1 Container by mouth 2 (two) times daily between meals. (1000 & 1600) Prefers Chocolate  . hydrALAZINE (APRESOLINE) 100 MG tablet Take 100 mg by mouth 3 (three) times daily. If SBP >160 recheck B/P and document in vital sign record.  Marland Kitchen ipratropium (ATROVENT) 0.06 % nasal spray Place 2 sprays into both nostrils 2 (two) times daily as needed for rhinitis. PRN-NASAL DRAINAGE  . irbesartan (AVAPRO) 300 MG tablet Take 300 mg by mouth daily. HOLD for B/P <13/24, If systolic B/P is over 401, recheck B/P, document in Vital Signs.  Marland Kitchen levothyroxine (SYNTHROID, LEVOTHROID) 150 MCG tablet Take 150 mcg by mouth daily  before breakfast. (0730)  . Lidocaine (ASPERCREME LIDOCAINE) 4 % PTCH Apply 3 patches topically once. Apply 1 patch to the left hip, left knee, and the right shoulder in the morning. Remove in the evening.  . loratadine (CLARITIN) 10 MG tablet Take 1 tablet (10 mg total) by mouth daily.  . Menthol, Topical Analgesic, (BIOFREEZE) 4 % GEL Apply 1 application topically 3 (three) times daily as needed. Knee and hand pain  . mirtazapine (REMERON) 30 MG tablet Take 30 mg by mouth at bedtime.  . ondansetron (ZOFRAN) 4 MG tablet Take 4 mg by mouth every 8 (eight) hours as needed for nausea or vomiting.  Marland Kitchen oxyCODONE-acetaminophen (PERCOCET) 10-325 MG tablet Take 1 tablet by mouth 4 (four) times daily for 30 days. For pain,Change administration time of oxycodone - APAP 10 -325 mg PO to 6am, 12pm, 6pm, and 12pm. PLEASE ENCOURAGE RESIDENT TO TAKE THIS MEDICATION WITH FOOD  . oxyCODONE-acetaminophen (PERCOCET) 10-325 MG tablet Take 1 tablet by mouth as needed for pain. Take one by mouth daily at 12 am as needed for severe pain. HOLD IF RESPIRATIONS <12.  . pantoprazole (PROTONIX) 40 MG tablet Take 40 mg by mouth daily.  . polyethylene glycol (MIRALAX  / GLYCOLAX) packet Take 17 g by mouth daily.  . promethazine (PHENERGAN) 25 MG suppository Place 25 mg rectally every 6 (six) hours as needed for nausea or vomiting. nausea WITH vomiting x 24 hours. Clear liquid diet x24 hours. If NOT effective, notify MD.  . psyllium (METAMUCIL) 58.6 % powder Take 1 packet by mouth daily. Mix in 6 oz of water or juice  . sennosides-docusate sodium (SENOKOT-S) 8.6-50 MG tablet Take 1 tablet by mouth daily. Hold for loose stool  . sodium chloride 1 g tablet Take 1 g by mouth daily.  Marland Kitchen UNABLE TO FIND Med Name: Hydrocolloid dressing 4x4. Change dressing to the coccyx as needed. Check patency daily  . zinc oxide 20 % ointment Apply 1 application topically 3 (three) times daily as needed for irritation. Apply to buttocks for redness/dark pink/excoriation   No facility-administered encounter medications on file as of 06/29/2018.     Review of Systems  Constitutional: Negative.   HENT: Negative.   Respiratory: Negative.   Cardiovascular: Negative.   Gastrointestinal: Negative.   Genitourinary: Negative.   Musculoskeletal: Positive for arthralgias and myalgias.  Neurological: Negative.   Psychiatric/Behavioral: Positive for dysphoric mood.  All other systems reviewed and are negative.   Immunization History  Administered Date(s) Administered  . Influenza Split 02/13/2013  . Influenza-Unspecified 02/27/2014, 02/12/2015, 02/25/2016, 03/08/2017, 02/19/2018  . PPD Test 09/09/2015  . Pneumococcal Conjugate-13 05/05/2016  . Pneumococcal Polysaccharide-23 02/13/2013  . Td 05/05/2016  . Zoster 05/04/2016   Pertinent  Health Maintenance Due  Topic Date Due  . MAMMOGRAM  03/21/1945  . INFLUENZA VACCINE  Completed  . DEXA SCAN  Completed  . PNA vac Low Risk Adult  Completed   Fall Risk  01/08/2018 01/02/2017 11/10/2015 10/20/2015 10/20/2015  Falls in the past year? No No No Yes No  Number falls in past yr: - - - 2 or more -  Injury with Fall? - - - Yes -  Risk  Factor Category  - - - - -  Risk for fall due to : - - - History of fall(s);Impaired balance/gait -  Follow up - - - - -   Functional Status Survey:    Vitals:   06/29/18 1140  BP: (!) 150/90  Pulse: 64  Resp: 19  Temp: 98.2 F (36.8 C)  SpO2: 98%  Weight: 116 lb 12.8 oz (53 kg)  Height: 5\' 8"  (1.727 m)   Body mass index is 17.76 kg/m. Physical Exam Vitals signs reviewed.  Constitutional:      Appearance: Normal appearance.  HENT:     Head: Normocephalic.     Nose: Nose normal.     Mouth/Throat:     Mouth: Mucous membranes are moist.     Pharynx: Oropharynx is clear.  Eyes:     Pupils: Pupils are equal, round, and reactive to light.  Cardiovascular:     Pulses: Normal pulses.     Heart sounds: Normal heart sounds.  Pulmonary:     Effort: Pulmonary effort is normal.     Breath sounds: Normal breath sounds.  Abdominal:     General: Abdomen is flat. Bowel sounds are normal.     Palpations: Abdomen is soft.  Musculoskeletal:        General: No swelling.     Comments: Both Knees did not have any swelling, Redness . And no restriction in mobility or Range of motion  Skin:    General: Skin is warm and dry.  Neurological:     General: No focal deficit present.     Mental Status: She is alert and oriented to person, place, and time.     Labs reviewed: Recent Labs    07/13/17  11/02/17 12/07/17 01/04/18 01/30/18  NA 128*  128   < > 130* 130* 131 131*  K 3.9  3.9   < > 4.0 3.9 3.8 4.0  CL 97  --  96 95  --   --   CO2 24  --  27 28  --   --   BUN 21  21   < > 23* 24* 31* 34*  CREATININE 1.0  0.98   < > 1.1 1.0 1.12 1.1  CALCIUM 8.2  8.2   < > 9.1 8.9 8.8  --    < > = values in this interval not displayed.   Recent Labs    08/31/17 10/19/17 01/30/18  AST 20 28 18   ALT 1.57 16 12  ALKPHOS 53 76 53  BILITOT 0.8 0.8  --   PROT 5.5 6.7  --   ALBUMIN 3.3 3.8  --    Recent Labs    07/03/17  08/31/17 10/24/17 01/04/18  WBC 6.2   < > 7 5.2 4.7  HGB 12.0   <  > 11.4 11.8* 11.8  HCT 34*   < > 32.1 33* 34.6  MCV  --   --   --   --  98.6  PLT 188  --   --  173  --    < > = values in this interval not displayed.   Lab Results  Component Value Date   TSH 1.71 01/04/2018   No results found for: HGBA1C Lab Results  Component Value Date   CHOL 175 11/10/2014   HDL 53 11/10/2014   LDLCALC 87 11/10/2014   TRIG 173 (A) 11/10/2014    Significant Diagnostic Results in last 30 days:  No results found.  Assessment/Plan  Essential hypertension She  is on a number of medications including hydralazine, clonidine, atenolol, Avapro Her blood pressure goes up mostly with anxiety and pain in her knees. We will start her on HCTZ12.5 mg daily Repeat BMP in 2 weeks To Follow Sodium Hypothyroidism, unspecified type TSH in Normal 08/19 Repeat TSH  Primary  osteoarthritis involving multiple joints On high-dose of Tylenol and Percocet Cannot increase to as needed Percocet due to toxicity with Tylenol Will DC Percocet and start her on oxycodone 10 mg every 8 hours as needed We will continue the standing dose of Percocet 4 times a day. Also on Tylenol  History of pulmonary embolism Finished her course of Eliquis and was taken off few months ago Patient continues to be stable  Depression with anxiety Is depressed is on Remeron Will consider Cymbalta on next visit which can help her chronic pain Cannot use SSRI due to Hyponatremia Chronic Hyponatremia On Sodium tabs Watch Sodium on HCTZ    Family/ staff Communication:   Labs/tests ordered:  BMP,CBC, Vit D , TSH Total time spent in this patient care encounter was 25_ minutes; greater than 50% of the visit spent counseling patient, reviewing records , Labs and coordinating care for problems addressed at this encounter.

## 2018-07-02 DIAGNOSIS — R2681 Unsteadiness on feet: Secondary | ICD-10-CM | POA: Diagnosis not present

## 2018-07-02 DIAGNOSIS — R609 Edema, unspecified: Secondary | ICD-10-CM | POA: Diagnosis not present

## 2018-07-02 DIAGNOSIS — M6281 Muscle weakness (generalized): Secondary | ICD-10-CM | POA: Diagnosis not present

## 2018-07-04 ENCOUNTER — Encounter: Payer: Self-pay | Admitting: Internal Medicine

## 2018-07-04 ENCOUNTER — Non-Acute Institutional Stay: Payer: Medicare Other | Admitting: Internal Medicine

## 2018-07-04 DIAGNOSIS — F418 Other specified anxiety disorders: Secondary | ICD-10-CM | POA: Diagnosis not present

## 2018-07-04 DIAGNOSIS — N183 Chronic kidney disease, stage 3 unspecified: Secondary | ICD-10-CM

## 2018-07-04 DIAGNOSIS — E039 Hypothyroidism, unspecified: Secondary | ICD-10-CM

## 2018-07-04 DIAGNOSIS — I10 Essential (primary) hypertension: Secondary | ICD-10-CM | POA: Diagnosis not present

## 2018-07-04 DIAGNOSIS — M6281 Muscle weakness (generalized): Secondary | ICD-10-CM | POA: Diagnosis not present

## 2018-07-04 DIAGNOSIS — M25562 Pain in left knee: Secondary | ICD-10-CM | POA: Diagnosis not present

## 2018-07-04 DIAGNOSIS — M25561 Pain in right knee: Secondary | ICD-10-CM | POA: Diagnosis not present

## 2018-07-04 DIAGNOSIS — R609 Edema, unspecified: Secondary | ICD-10-CM | POA: Diagnosis not present

## 2018-07-04 DIAGNOSIS — R2681 Unsteadiness on feet: Secondary | ICD-10-CM | POA: Diagnosis not present

## 2018-07-04 NOTE — Progress Notes (Signed)
Location:  [facility] Room Number: 35 Place of Service:  ALF (13) Provider:    Mast, Man X, NP  Patient Care Team: Mast, Man X, NP as PCP - General (Nurse Practitioner)  Extended Emergency Contact Information Primary Emergency Contact: Tays,[name] Address: [address] [place], [place] [address] [place] Mobile Phone: [phone] Relation: Son  Code Status:  DNR Goals of care: Advanced Directive information Advanced Directives 07/04/2018  Does Patient Have a Medical Advance Directive? Yes  Type of Paramedic of [place];Out of facility DNR (pink MOST or yellow form)  Does patient want to make changes to medical advance directive? No - Patient declined  Copy of [place] in Chart? Yes - validated most recent copy scanned in chart (See row information)  Would patient like information on creating a medical advance directive? -  Pre-existing out of facility DNR order (yellow form or pink MOST form) Yellow form placed in chart (order not valid for inpatient use);Pink MOST form placed in chart (order not valid for inpatient use)     Chief Complaint  Patient presents with  . Acute Visit    Hypertension,and Bradycardia    HPI:  Pt is a 85 y.o. female seen today for an acute visit for Hypertension and bradycardia  She is a resident in assisted living and [facility] She has a history of uncontrolled hypertension, severe arthritis depression with anxiety.H/o PE in 2016,  Patient has been getting high BP 200/100 specially in the morning at 6 am when she gets up. Last visit she was also c/o Pain in her both Knees. And she said her BP goes up when she has severe pain in her leg I had increased her Oxycodone to 10 mg Q 8 Prn for pain. And also started her on Low dose of HCTZ 12.5 mg Her BP is little better but continues to be high in the morning  Nurses are also concern because her HR is in 50 and  she is on high dose of atenolol Patient seen in her room today. Her pain seems to be more controlled now. She says she gets some headache when her BP is this high in the morning. But denies any other complains Patient does walk with a walker and is independent in her transfers Denies any Chest Pian or SOB   Past Medical History:  Diagnosis Date  . Acquired cyst of kidney    left  . Allergic rhinitis, cause unspecified   . Anemia, unspecified   . Chronic hyponatremia 09/05/2015  . CKD stage 2 due to type 2 diabetes mellitus ([place]) 11/18/2014  . Closed fracture of unspecified part of vertebral column without mention of spinal cord injury    T7 s/p kyphoplasty, T12  . Edema 2015  . Herpes simplex without mention of complication   . History of Epstein-Barr virus infection   . Hyposmolality and/or hyponatremia    07/13/15 Na 131  . Insomnia   . Insomnia, unspecified   . Left cavernous carotid aneurysm 08/2008   1-1/69mm aneurysm of cavernous carotid artery  . Malignant neoplasm of breast (female), unspecified site 1990   left. Had surgerey and chemo, but no radiation  . 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 Neuropathy   . Osteoarthrosis of knee    bilaterally  . Osteoarthrosis, hip   . Osteoarthrosis,  unspecified whether generalized or localized, forearm    bilaterally wrist  . Other and unspecified nonspecific immunological findings    positive ANA  . Other B-complex deficiencies   . Pain in joint, site unspecified    severe, diffuse, chronic pain  . Positive ANA (antinuclear antibody)   . Pulmonary embolism ([facility]) 05/07/2015  . Pure hypercholesterolemia   . Senile osteoporosis   . Unspecified essential hypertension   . Unspecified gastritis and gastroduodenitis without mention of hemorrhage   . Unspecified hypothyroidism   . Unspecified vitamin D deficiency    Past Surgical History:  Procedure Laterality Date  . BREAST SURGERY  Left   . DILATATION & CURETTAGE/HYSTEROSCOPY WITH MYOSURE N/A 06/27/2017   Procedure: DILATATION & CURETTAGE/HYSTEROSCOPY WITH MYOSURE;  Surgeon: Servando Salina, MD;  Location: [facility];  Service: Gynecology;  Laterality: N/A;  . DILATION AND CURETTAGE OF UTERUS  X 2  . FEMUR IM NAIL Right 03/18/2015   Procedure: INTRAMEDULLARY (IM) NAIL FEMORAL;  Surgeon: Rod Can, MD;  Location: [facility];  Service: Orthopedics;  Laterality: Right;  . FRACTURE SURGERY     hip  . KYPHOPLASTY  07/03/2010   T12  . KYPHOPLASTY     C7  . LAPAROSCOPIC CHOLECYSTECTOMY  09/20/2006  . MASTECTOMY Left 04/29/1989  . MIDDLE EAR SURGERY Bilateral 1938  . NASAL SINUS SURGERY  1990 & 2000  . ORIF HIP FRACTURE Left 06/13/2007   following a syncopal episode  . TONSILLECTOMY  1968    Allergies  Allergen Reactions  . Aspirin     Drop in body temp with large quantities, can tolerate low doses of aspirin  . Penicillins Swelling    Has patient had a PCN reaction causing immediate rash, facial/tongue/throat swelling, SOB or lightheadedness with hypotension: No Has patient had a PCN reaction causing severe rash involving mucus membranes or skin necrosis: No Has patient had a PCN reaction that required hospitalization No Has patient had a PCN reaction occurring within the last 10 years: No If all of the above answers are "NO", then may proceed with Cephalosporin use.    Outpatient Encounter Medications as of 07/04/2018  Medication Sig  . acetaminophen (TYLENOL) 500 MG tablet Take 1,000 mg by mouth at bedtime.   Marland Kitchen acetaminophen (TYLENOL) 500 MG tablet Take 500 mg by mouth every morning.   Marland Kitchen atenolol (TENORMIN) 100 MG tablet Take 100 mg by mouth daily. Hold if SBP less than 110 or HR less than 60 bpm; if SBP greater than 160 chart in vital signs  . cloNIDine (CATAPRES) 0.1 MG tablet Take 0.1 mg by mouth 2 (two) times daily as needed.  . famotidine (PEPCID) 10 MG tablet Take 10 mg by mouth at bedtime.  . feeding  supplement (BOOST / RESOURCE BREEZE) LIQD Take 1 Container by mouth 2 (two) times daily between meals. (1000 & 1600) Prefers Chocolate  . hydrALAZINE (APRESOLINE) 100 MG tablet Take 100 mg by mouth 3 (three) times daily. If SBP >160 recheck B/P and document in vital sign record.  . hydrochlorothiazide (HYDRODIURIL) 12.5 MG tablet Take 12.5 mg by mouth daily.  Marland Kitchen ipratropium (ATROVENT) 0.06 % nasal spray Place 2 sprays into both nostrils 2 (two) times daily as needed for rhinitis. PRN-NASAL DRAINAGE  . irbesartan (AVAPRO) 300 MG tablet Take 300 mg by mouth daily. HOLD for B/P <43/15, If systolic B/P is over 400, recheck B/P, document in Vital Signs.  Marland Kitchen levothyroxine (SYNTHROID, LEVOTHROID) 150 MCG tablet Take 150 mcg by mouth daily before breakfast. (  0730)  . Lidocaine (ASPERCREME LIDOCAINE) 4 % PTCH Apply 3 patches topically once. Apply 1 patch to the left hip, left knee, and the right shoulder in the morning. Remove in the evening.  . loratadine (CLARITIN) 10 MG tablet Take 1 tablet (10 mg total) by mouth daily.  . Menthol, Topical Analgesic, (BIOFREEZE) 4 % GEL Apply 1 application topically 3 (three) times daily as needed. Knee and hand pain  . mirtazapine (REMERON) 30 MG tablet Take 30 mg by mouth at bedtime.  . ondansetron (ZOFRAN) 4 MG tablet Take 4 mg by mouth every 8 (eight) hours as needed for nausea or vomiting.  Marland Kitchen oxyCODONE-acetaminophen (PERCOCET) 10-325 MG tablet Take 1 tablet by mouth 4 (four) times daily for 30 days. For pain,Change administration time of oxycodone - APAP 10 -325 mg PO to 6am, 12pm, 6pm, and 12pm. PLEASE ENCOURAGE RESIDENT TO TAKE THIS MEDICATION WITH FOOD  . oxyCODONE-acetaminophen (PERCOCET) 10-325 MG tablet Take 1 tablet by mouth as needed for pain. Take one by mouth daily at 12 am as needed for severe pain. HOLD IF RESPIRATIONS <12. (Patient taking differently: Take 1 tablet by mouth every 8 (eight) hours as needed for pain. Take 10mg  one by mouth daily at 12 am as  needed for severe pain. HOLD IF RESPIRATIONS <12.)  . pantoprazole (PROTONIX) 40 MG tablet Take 40 mg by mouth daily.  . polyethylene glycol (MIRALAX / GLYCOLAX) packet Take 17 g by mouth daily.  . promethazine (PHENERGAN) 25 MG suppository Place 25 mg rectally every 6 (six) hours as needed for nausea or vomiting. nausea WITH vomiting x 24 hours. Clear liquid diet x24 hours. If NOT effective, notify MD.  . psyllium (METAMUCIL) 58.6 % powder Take 1 packet by mouth daily. Mix in 6 oz of water or juice  . sennosides-docusate sodium (SENOKOT-S) 8.6-50 MG tablet Take 1 tablet by mouth daily. Hold for loose stool  . sodium chloride 1 g tablet Take 1 g by mouth daily.  Marland Kitchen UNABLE TO FIND Med Name: Hydrocolloid dressing 4x4. Change dressing to the coccyx as needed. Check patency daily  . zinc oxide 20 % ointment Apply 1 application topically 3 (three) times daily as needed for irritation. Apply to buttocks for redness/dark pink/excoriation   No facility-administered encounter medications on file as of 07/04/2018.     Review of Systems  Constitutional: Negative.   HENT: Negative.   Respiratory: Negative.   Cardiovascular: Negative.   Gastrointestinal: Negative.   Genitourinary: Negative.   Musculoskeletal: Positive for arthralgias and myalgias.  Neurological: Negative.   Psychiatric/Behavioral: Positive for dysphoric mood.  All other systems reviewed and are negative.   Immunization History  Administered Date(s) Administered  . Influenza Split 02/13/2013  . Influenza-Unspecified 02/27/2014, 02/12/2015, 02/25/2016, 03/08/2017, 02/19/2018  . PPD Test 09/09/2015  . Pneumococcal Conjugate-13 05/05/2016  . Pneumococcal Polysaccharide-23 02/13/2013  . Td 05/05/2016  . Zoster 05/04/2016   Pertinent  Health Maintenance Due  Topic Date Due  . MAMMOGRAM  03/21/1945  . INFLUENZA VACCINE  Completed  . DEXA SCAN  Completed  . PNA vac Low Risk Adult  Completed   Fall Risk  01/08/2018 01/02/2017  11/10/2015 10/20/2015 10/20/2015  Falls in the past year? No No No Yes No  Number falls in past yr: - - - 2 or more -  Injury with Fall? - - - Yes -  Risk Factor Category  - - - - -  Risk for fall due to : - - - History of fall(s);Impaired balance/gait -  Follow up - - - - -   Functional Status Survey:    Vitals:   07/04/18 1027  BP: (!) 179/82  Pulse: (!) 52  Resp: 14  Temp: 98.2 F (36.8 C)  SpO2: 98%  Weight: 118 lb 9.6 oz (53.8 kg)  Height: 5\' 8"  (1.727 m)   Body mass index is 18.03 kg/m. Physical Exam Vitals signs reviewed.  Constitutional:      Appearance: Normal appearance.  HENT:     Head: Normocephalic.     Nose: Nose normal.     Mouth/Throat:     Mouth: Mucous membranes are moist.     Pharynx: Oropharynx is clear.  Eyes:     Pupils: Pupils are equal, round, and reactive to light.  Cardiovascular:     Pulses: Normal pulses.     Heart sounds: Normal heart sounds.  Pulmonary:     Effort: Pulmonary effort is normal.     Breath sounds: Normal breath sounds.  Abdominal:     General: Abdomen is flat. Bowel sounds are normal.     Palpations: Abdomen is soft.  Musculoskeletal:        General: No swelling.     Comments: Both Knees did not have any swelling, Redness . And no restriction in mobility or Range of motion  Skin:    General: Skin is warm and dry.  Neurological:     General: No focal deficit present.     Mental Status: She is alert and oriented to person, place, and time.     Labs reviewed: Recent Labs    07/13/17  11/02/17 12/07/17 01/04/18 01/30/18  NA 128*  128   < > 130* 130* 131 131*  K 3.9  3.9   < > 4.0 3.9 3.8 4.0  CL 97  --  96 95  --   --   CO2 24  --  27 28  --   --   BUN 21  21   < > 23* 24* 31* 34*  CREATININE 1.0  0.98   < > 1.1 1.0 1.12 1.1  CALCIUM 8.2  8.2   < > 9.1 8.9 8.8  --    < > = values in this interval not displayed.   Recent Labs    08/31/17 10/19/17 01/30/18  AST 20 28 18   ALT 1.57 16 12  ALKPHOS 53 76 53    BILITOT 0.8 0.8  --   PROT 5.5 6.7  --   ALBUMIN 3.3 3.8  --    Recent Labs    08/31/17 10/24/17 01/04/18  WBC 7 5.2 4.7  HGB 11.4 11.8* 11.8  HCT 32.1 33* 34.6  MCV  --   --  98.6  PLT  --  173  --    Lab Results  Component Value Date   TSH 1.71 01/04/2018   No results found for: HGBA1C Lab Results  Component Value Date   CHOL 175 11/10/2014   HDL 53 11/10/2014   LDLCALC 87 11/10/2014   TRIG 173 (A) 11/10/2014    Significant Diagnostic Results in last 30 days:  No results found.  Assessment/Plan Essential hypertension She  is on a number of medications including hydralazine, clonidine, atenolol, Avapro  started her on HCTZ12.5 mg daily Cannot go up on the dose as she has h/o Hyponatremia Will start her on Norvasc 5 mg QD Decreased atenolol t 75 mg Qd due to Bradycardia Reeval in few dyas Repeat BMP in 2 weeks To Follow  Sodium Hypothyroidism, unspecified type TSH in Normal 08/19 Repeat TSH pending  Primary osteoarthritis involving multiple joints On high-dose of Tylenol and Percocet Also now on Oxycodone 10 mg Q8 Prn Doing better Order X rays of Knee as they have never done before  History of pulmonary embolism Finished her course of Eliquis and was taken off few months ago Patient continues to be stable  Depression with anxiety Is depressed is on Remeron Will consider Cymbalta on next visit which can help her chronic pain Cannot use SSRI due to Hyponatremia Chronic Hyponatremia On Sodium tabs Watch Sodium on HCTZ  Family/ staff Communication:   Labs/tests ordered:   Total time spent in this patient care encounter was 25_ minutes; greater than 50% of the visit spent counseling patient, reviewing records , Labs and coordinating care for problems addressed at this encounter.

## 2018-07-09 DIAGNOSIS — R2681 Unsteadiness on feet: Secondary | ICD-10-CM | POA: Diagnosis not present

## 2018-07-09 DIAGNOSIS — R609 Edema, unspecified: Secondary | ICD-10-CM | POA: Diagnosis not present

## 2018-07-09 DIAGNOSIS — M6281 Muscle weakness (generalized): Secondary | ICD-10-CM | POA: Diagnosis not present

## 2018-07-10 ENCOUNTER — Non-Acute Institutional Stay: Payer: Medicare Other | Admitting: Nurse Practitioner

## 2018-07-10 ENCOUNTER — Encounter: Payer: Self-pay | Admitting: Nurse Practitioner

## 2018-07-10 DIAGNOSIS — R609 Edema, unspecified: Secondary | ICD-10-CM | POA: Diagnosis not present

## 2018-07-10 DIAGNOSIS — K5909 Other constipation: Secondary | ICD-10-CM | POA: Diagnosis not present

## 2018-07-10 DIAGNOSIS — I10 Essential (primary) hypertension: Secondary | ICD-10-CM

## 2018-07-10 DIAGNOSIS — M6281 Muscle weakness (generalized): Secondary | ICD-10-CM | POA: Diagnosis not present

## 2018-07-10 DIAGNOSIS — K219 Gastro-esophageal reflux disease without esophagitis: Secondary | ICD-10-CM

## 2018-07-10 DIAGNOSIS — M1990 Unspecified osteoarthritis, unspecified site: Secondary | ICD-10-CM

## 2018-07-10 DIAGNOSIS — F418 Other specified anxiety disorders: Secondary | ICD-10-CM

## 2018-07-10 DIAGNOSIS — E039 Hypothyroidism, unspecified: Secondary | ICD-10-CM

## 2018-07-10 DIAGNOSIS — E871 Hypo-osmolality and hyponatremia: Secondary | ICD-10-CM | POA: Diagnosis not present

## 2018-07-10 DIAGNOSIS — R2681 Unsteadiness on feet: Secondary | ICD-10-CM | POA: Diagnosis not present

## 2018-07-10 NOTE — Assessment & Plan Note (Signed)
Her mood is stable, continue Mirtazapine 30mg  qhs.

## 2018-07-10 NOTE — Assessment & Plan Note (Signed)
has chronic knee pain, s/p left knee inj, continue  Percocet 10/325mg  qid and qd prn, Oxycodone 10mg  q8h prn, Tylenol 500mg  qam, 1000mg  qpm.

## 2018-07-10 NOTE — Progress Notes (Signed)
Location:  [facility] Room Number: 35 Place of Service:  SNF (31) Provider:  Marlana Latus  NP  Matti Killingsworth X, NP  Patient Care Team: Kyira Volkert X, NP as PCP - General (Nurse Practitioner)  Extended Emergency Contact Information Primary Emergency Contact: Dement,[name] Address: [address] [place], [place] [address] [place] Mobile Phone: [phone] Relation: Son  Code Status:  DNR Goals of care: Advanced Directive information Advanced Directives 07/04/2018  Does Patient Have a Medical Advance Directive? Yes  Type of Paramedic of [place];Out of facility DNR (pink MOST or yellow form)  Does patient want to make changes to medical advance directive? No - Patient declined  Copy of [place] in Chart? Yes - validated most recent copy scanned in chart (See row information)  Would patient like information on creating a medical advance directive? -  Pre-existing out of facility DNR order (yellow form or pink MOST form) Yellow form placed in chart (order not valid for inpatient use);Pink MOST form placed in chart (order not valid for inpatient use)     Chief Complaint  Patient presents with  . Medical Management of Chronic Issues    HPI:  Pt is a 85 y.o. female seen today for medical management of chronic diseases.   The patient has chronic knee pain, s/p left knee inj, on Percocet 10/325mg  qid and qd prn, Oxycodone 10mg  q8h prn, Tylenol 500mg  qam, 1000mg  qpm.  Chronic trace edema BLE. Hyponatremia, baseline Na 130s. No constipation while on Senokot S I qd, Psyllium 1 pk daily, MiraLax qd. GERD stable on Pantoprazole 40mg  qd, Famotidine 10mg  qd, prn Zofran 4mg  q8h and Phenergan 25mg  q6h. Her mood is stable on Mirtazapine 30mg  qhs. Hypothyroidism, on Levothyroxine 158mcg qd, last TSH wnl 1.71 01/04/18. HTN, blood pressure is not well controlled, on Irbesartan 300mg  qd, HCTZ 12.5mg  qd,  Hydralazine 100mg  tid, Clonidine 0.1mg  bid prn, Atenolol 75mg  bid, Amlodipine 5mg  qd.    Past Medical History:  Diagnosis Date  . Acquired cyst of kidney    left  . Allergic rhinitis, cause unspecified   . Anemia, unspecified   . Chronic hyponatremia 09/05/2015  . CKD stage 2 due to type 2 diabetes mellitus ([place]) 11/18/2014  . Closed fracture of unspecified part of vertebral column without mention of spinal cord injury    T7 s/p kyphoplasty, T12  . Edema 2015  . Herpes simplex without mention of complication   . History of Epstein-Barr virus infection   . Hyposmolality and/or hyponatremia    07/13/15 Na 131  . Insomnia   . Insomnia, unspecified   . Left cavernous carotid aneurysm 08/2008   1-1/13mm aneurysm of cavernous carotid artery  . Malignant neoplasm of breast (female), unspecified site 1990   left. Had surgerey and chemo, but no radiation  . 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 Neuropathy   . Osteoarthrosis of knee    bilaterally  . Osteoarthrosis, hip   . Osteoarthrosis, unspecified whether generalized or localized, forearm    bilaterally wrist  . Other and unspecified nonspecific immunological findings    positive ANA  . Other B-complex deficiencies   . Pain in joint, site unspecified    severe, diffuse, chronic pain  . Positive ANA (antinuclear antibody)   . Pulmonary embolism ([place]) 05/07/2015  . Pure hypercholesterolemia   . Senile  osteoporosis   . Unspecified essential hypertension   . Unspecified gastritis and gastroduodenitis without mention of hemorrhage   . Unspecified hypothyroidism   . Unspecified vitamin D deficiency    Past Surgical History:  Procedure Laterality Date  . BREAST SURGERY Left   . DILATATION & CURETTAGE/HYSTEROSCOPY WITH MYOSURE N/A 06/27/2017   Procedure: DILATATION & CURETTAGE/HYSTEROSCOPY WITH MYOSURE;  Surgeon: Servando Salina, MD;  Location: [facility];  Service: Gynecology;  Laterality:  N/A;  . DILATION AND CURETTAGE OF UTERUS  X 2  . FEMUR IM NAIL Right 03/18/2015   Procedure: INTRAMEDULLARY (IM) NAIL FEMORAL;  Surgeon: Rod Can, MD;  Location: [facility];  Service: Orthopedics;  Laterality: Right;  . FRACTURE SURGERY     hip  . KYPHOPLASTY  07/03/2010   T12  . KYPHOPLASTY     C7  . LAPAROSCOPIC CHOLECYSTECTOMY  09/20/2006  . MASTECTOMY Left 04/29/1989  . MIDDLE EAR SURGERY Bilateral 1938  . NASAL SINUS SURGERY  1990 & 2000  . ORIF HIP FRACTURE Left 06/13/2007   following a syncopal episode  . TONSILLECTOMY  1968    Allergies  Allergen Reactions  . Aspirin     Drop in body temp with large quantities, can tolerate low doses of aspirin  . Penicillins Swelling    Has patient had a PCN reaction causing immediate rash, facial/tongue/throat swelling, SOB or lightheadedness with hypotension: No Has patient had a PCN reaction causing severe rash involving mucus membranes or skin necrosis: No Has patient had a PCN reaction that required hospitalization No Has patient had a PCN reaction occurring within the last 10 years: No If all of the above answers are "NO", then may proceed with Cephalosporin use.    Outpatient Encounter Medications as of 07/10/2018  Medication Sig  . acetaminophen (TYLENOL) 500 MG tablet Take 1,000 mg by mouth at bedtime.   Marland Kitchen acetaminophen (TYLENOL) 500 MG tablet Take 500 mg by mouth every morning.   Marland Kitchen amLODipine (NORVASC) 5 MG tablet Take 5 mg by mouth daily.  Marland Kitchen atenolol (TENORMIN) 50 MG tablet Take 75 mg by mouth 2 (two) times daily. Take 1 and 1/2 tablets = 75 mg. Hold atenolol if HR less than 55 bpm.  . cloNIDine (CATAPRES) 0.1 MG tablet Take 0.1 mg by mouth 2 (two) times daily as needed. Take in the morning and at bedtime.  . famotidine (PEPCID) 10 MG tablet Take 10 mg by mouth at bedtime.  . feeding supplement (BOOST / RESOURCE BREEZE) LIQD Take 1 Container by mouth 2 (two) times daily between meals. (1000 & 1600) Prefers Chocolate  .  hydrALAZINE (APRESOLINE) 100 MG tablet Take 100 mg by mouth 3 (three) times daily. If SBP >160 recheck B/P and document in vital sign record.  . hydrochlorothiazide (HYDRODIURIL) 12.5 MG tablet Take 12.5 mg by mouth daily.  Marland Kitchen ipratropium (ATROVENT) 0.06 % nasal spray Place 2 sprays into both nostrils 2 (two) times daily as needed for rhinitis. PRN-NASAL DRAINAGE  . irbesartan (AVAPRO) 300 MG tablet Take 300 mg by mouth daily. HOLD for B/P <37/62, If systolic B/P is over 831, recheck B/P, document in Vital Signs.  Marland Kitchen levothyroxine (SYNTHROID, LEVOTHROID) 150 MCG tablet Take 150 mcg by mouth daily before breakfast. (0730)  . Lidocaine (ASPERCREME LIDOCAINE) 4 % PTCH Apply 3 patches topically once. Apply 1 patch to the left hip, left knee, and the right shoulder in the morning. Remove in the evening.  . loratadine (CLARITIN) 10 MG tablet Take 1 tablet (10 mg total)  by mouth daily.  . Menthol, Topical Analgesic, (BIOFREEZE) 4 % GEL Apply 1 application topically 3 (three) times daily as needed. Knee and hand pain  . mirtazapine (REMERON) 30 MG tablet Take 30 mg by mouth at bedtime.  . ondansetron (ZOFRAN) 4 MG tablet Take 4 mg by mouth every 8 (eight) hours as needed for nausea or vomiting.  . Oxycodone HCl 10 MG TABS Take 10 mg by mouth every 8 (eight) hours as needed.  Marland Kitchen oxyCODONE-acetaminophen (PERCOCET) 10-325 MG tablet Take 1 tablet by mouth every 6 (six) hours as needed for pain. Take APAP 10 -325 mg PO to 6am, 12pm, 6pm, and 12 am.  . pantoprazole (PROTONIX) 40 MG tablet Take 40 mg by mouth daily.  . polyethylene glycol (MIRALAX / GLYCOLAX) packet Take 17 g by mouth daily.  . promethazine (PHENERGAN) 25 MG suppository Place 25 mg rectally every 6 (six) hours as needed for nausea or vomiting. nausea WITH vomiting x 24 hours. Clear liquid diet x24 hours. If NOT effective, notify MD.  . psyllium (METAMUCIL) 58.6 % powder Take 1 packet by mouth daily. Mix in 6 oz of water or juice  . sennosides-docusate  sodium (SENOKOT-S) 8.6-50 MG tablet Take 1 tablet by mouth daily. Hold for loose stool  . sodium chloride 1 g tablet Take 1 g by mouth daily.  Marland Kitchen UNABLE TO FIND Med Name: Hydrocolloid dressing 4x4. Change dressing to the coccyx as needed. Check patency daily  . zinc oxide 20 % ointment Apply 1 application topically 3 (three) times daily as needed for irritation. Apply to buttocks for redness/dark pink/excoriation  . [DISCONTINUED] atenolol (TENORMIN) 100 MG tablet Take 100 mg by mouth daily. Hold if SBP less than 110 or HR less than 60 bpm; if SBP greater than 160 chart in vital signs  . [DISCONTINUED] oxyCODONE-acetaminophen (PERCOCET) 10-325 MG tablet Take 1 tablet by mouth as needed for pain. Take one by mouth daily at 12 am as needed for severe pain. HOLD IF RESPIRATIONS <12. (Patient taking differently: Take 1 tablet by mouth every 8 (eight) hours as needed for pain. Take 10mg  one by mouth daily at 12 am as needed for severe pain. HOLD IF RESPIRATIONS <12.)   No facility-administered encounter medications on file as of 07/10/2018.    ROS was provided with assistance of staff.  Review of Systems  Constitutional: Negative for activity change, appetite change, chills, diaphoresis, fatigue, fever and unexpected weight change.  HENT: Positive for hearing loss. Negative for congestion and voice change.   Eyes: Negative for visual disturbance.  Respiratory: Negative for cough, shortness of breath and wheezing.   Cardiovascular: Positive for leg swelling. Negative for chest pain and palpitations.  Gastrointestinal: Negative for abdominal distention, abdominal pain, constipation, diarrhea, nausea and vomiting.  Genitourinary: Negative for difficulty urinating, dysuria and urgency.  Musculoskeletal: Positive for arthralgias and gait problem.  Skin: Negative for color change and pallor.  Neurological: Negative for dizziness, speech difficulty, weakness and headaches.       Memory lapses.     Psychiatric/Behavioral: Negative for agitation, behavioral problems, hallucinations and sleep disturbance. The patient is not nervous/anxious.     Immunization History  Administered Date(s) Administered  . Influenza Split 02/13/2013  . Influenza-Unspecified 02/27/2014, 02/12/2015, 02/25/2016, 03/08/2017, 02/19/2018  . PPD Test 09/09/2015  . Pneumococcal Conjugate-13 05/05/2016  . Pneumococcal Polysaccharide-23 02/13/2013  . Td 05/05/2016  . Zoster 05/04/2016   Pertinent  Health Maintenance Due  Topic Date Due  . MAMMOGRAM  03/21/1945  .  INFLUENZA VACCINE  Completed  . DEXA SCAN  Completed  . PNA vac Low Risk Adult  Completed   Fall Risk  01/08/2018 01/02/2017 11/10/2015 10/20/2015 10/20/2015  Falls in the past year? No No No Yes No  Number falls in past yr: - - - 2 or more -  Injury with Fall? - - - Yes -  Risk Factor Category  - - - - -  Risk for fall due to : - - - History of fall(s);Impaired balance/gait -  Follow up - - - - -   Functional Status Survey:    Vitals:   07/10/18 0925  BP: (!) 168/96  Pulse: 62  Resp: 14  Temp: (!) 97.3 F (36.3 C)  SpO2: 98%  Weight: 118 lb 9.6 oz (53.8 kg)  Height: 5\' 8"  (1.727 m)   Body mass index is 18.03 kg/m. Physical Exam Constitutional:      General: She is not in acute distress.    Appearance: Normal appearance. She is not ill-appearing, toxic-appearing or diaphoretic.  HENT:     Head: Normocephalic and atraumatic.     Nose: Nose normal.     Mouth/Throat:     Mouth: Mucous membranes are moist.  Eyes:     Extraocular Movements: Extraocular movements intact.     Pupils: Pupils are equal, round, and reactive to light.  Neck:     Musculoskeletal: Normal range of motion and neck supple.  Cardiovascular:     Rate and Rhythm: Normal rate and regular rhythm.     Heart sounds: No murmur.  Pulmonary:     Effort: Pulmonary effort is normal.     Breath sounds: Normal breath sounds. No wheezing or rales.  Abdominal:     General:  There is no distension.     Palpations: Abdomen is soft.     Tenderness: There is no abdominal tenderness. There is no guarding or rebound.  Musculoskeletal:     Right lower leg: Edema present.     Left lower leg: Edema present.     Comments: Trace edema BLE. Ambulate with walker.   Skin:    General: Skin is warm and dry.  Neurological:     General: No focal deficit present.     Mental Status: She is alert. Mental status is at baseline.     Cranial Nerves: No cranial nerve deficit.     Motor: No weakness.     Coordination: Coordination normal.     Gait: Gait abnormal.     Comments: Oriented to person and place.   Psychiatric:        Mood and Affect: Mood normal.        Behavior: Behavior normal.     Labs reviewed: Recent Labs    07/13/17  11/02/17 12/07/17 01/04/18 01/30/18  NA 128*  128   < > 130* 130* 131 131*  K 3.9  3.9   < > 4.0 3.9 3.8 4.0  CL 97  --  96 95  --   --   CO2 24  --  27 28  --   --   BUN 21  21   < > 23* 24* 31* 34*  CREATININE 1.0  0.98   < > 1.1 1.0 1.12 1.1  CALCIUM 8.2  8.2   < > 9.1 8.9 8.8  --    < > = values in this interval not displayed.   Recent Labs    08/31/17 10/19/17 01/30/18  AST 20 28  18  ALT 1.57 16 12  ALKPHOS 53 76 53  BILITOT 0.8 0.8  --   PROT 5.5 6.7  --   ALBUMIN 3.3 3.8  --    Recent Labs    08/31/17 10/24/17 01/04/18  WBC 7 5.2 4.7  HGB 11.4 11.8* 11.8  HCT 32.1 33* 34.6  MCV  --   --  98.6  PLT  --  173  --    Lab Results  Component Value Date   TSH 1.71 01/04/2018   No results found for: HGBA1C Lab Results  Component Value Date   CHOL 175 11/10/2014   HDL 53 11/10/2014   LDLCALC 87 11/10/2014   TRIG 173 (A) 11/10/2014    Significant Diagnostic Results in last 30 days:  No results found.  Assessment/Plan Essential hypertension  blood pressure is not well controlled, the patient stated her blood pressure usually elevated when she is in pain,  continue Irbesartan 300mg  qd, HCTZ 12.5mg  qd, Hydralazine  100mg  tid, Clonidine 0.1mg  bid prn, Atenolol 75mg  bid, Amlodipien 5mg  qd.    Chronic constipation Stable, continue Senokot S I qd, Psyllium 1 pk daily, MiraLax qd.  GERD without esophagitis Stable, continue Pantoprazole 40mg  qd, Famotidine 10mg  qd, prn Zofran 4mg  q8h and Phenergan 25mg  q6h.  Hypothyroidism conitnue Levothyroxine 127mcg qd, last TSH wnl 1.71 01/04/18.  Generalized arthritis has chronic knee pain, s/p left knee inj, continue  Percocet 10/325mg  qid and qd prn, Oxycodone 10mg  q8h prn, Tylenol 500mg  qam, 1000mg  qpm.    Chronic Hyponatremia Stable, her baseline Na in 130s.   Depression with anxiety Her mood is stable, continue Mirtazapine 30mg  qhs.   Edema Trace edema BLE is chronic, continue HCTZ 12.5mg  qd.     Family/ staff Communication: plan of care reviewed with the patient and charge nurse.   Labs/tests ordered:  None  Time spend 25 minutes

## 2018-07-10 NOTE — Assessment & Plan Note (Signed)
Stable, continue Senokot S I qd, Psyllium 1 pk daily, MiraLax qd.

## 2018-07-10 NOTE — Assessment & Plan Note (Signed)
conitnue Levothyroxine 170mcg qd, last TSH wnl 1.71 01/04/18.

## 2018-07-10 NOTE — Assessment & Plan Note (Signed)
blood pressure is not well controlled, the patient stated her blood pressure usually elevated when she is in pain,  continue Irbesartan 300mg  qd, HCTZ 12.5mg  qd, Hydralazine 100mg  tid, Clonidine 0.1mg  bid prn, Atenolol 75mg  bid, Amlodipien 5mg  qd.

## 2018-07-10 NOTE — Assessment & Plan Note (Signed)
Stable, her baseline Na in 130s.

## 2018-07-10 NOTE — Assessment & Plan Note (Signed)
Stable, continue Pantoprazole 40mg  qd, Famotidine 10mg  qd, prn Zofran 4mg  q8h and Phenergan 25mg  q6h.

## 2018-07-10 NOTE — Assessment & Plan Note (Signed)
Trace edema BLE is chronic, continue HCTZ 12.5mg  qd.

## 2018-07-12 ENCOUNTER — Other Ambulatory Visit: Payer: Self-pay

## 2018-07-12 DIAGNOSIS — I10 Essential (primary) hypertension: Secondary | ICD-10-CM | POA: Diagnosis not present

## 2018-07-12 LAB — BASIC METABOLIC PANEL
BUN: 25 — AB (ref 4–21)
Creatinine: 1.1 (ref <=1.1)
Glucose: 124
Potassium: 4.3 (ref 3.4–5.3)
Sodium: 127 — AB (ref 137–147)

## 2018-07-12 MED ORDER — OXYCODONE-ACETAMINOPHEN 10-325 MG PO TABS: 1.0000 | ORAL_TABLET | Freq: Four times a day (QID) | ORAL | 0 refills | Status: DC | PRN

## 2018-07-16 DIAGNOSIS — R609 Edema, unspecified: Secondary | ICD-10-CM | POA: Diagnosis not present

## 2018-07-16 DIAGNOSIS — M1610 Unilateral primary osteoarthritis, unspecified hip: Secondary | ICD-10-CM | POA: Diagnosis not present

## 2018-07-16 DIAGNOSIS — M6281 Muscle weakness (generalized): Secondary | ICD-10-CM | POA: Diagnosis not present

## 2018-07-16 DIAGNOSIS — E559 Vitamin D deficiency, unspecified: Secondary | ICD-10-CM | POA: Diagnosis not present

## 2018-07-16 DIAGNOSIS — R42 Dizziness and giddiness: Secondary | ICD-10-CM | POA: Diagnosis not present

## 2018-07-16 DIAGNOSIS — E039 Hypothyroidism, unspecified: Secondary | ICD-10-CM | POA: Diagnosis not present

## 2018-07-16 DIAGNOSIS — D649 Anemia, unspecified: Secondary | ICD-10-CM | POA: Diagnosis not present

## 2018-07-16 DIAGNOSIS — E7800 Pure hypercholesterolemia, unspecified: Secondary | ICD-10-CM | POA: Diagnosis not present

## 2018-07-16 DIAGNOSIS — Z4789 Encounter for other orthopedic aftercare: Secondary | ICD-10-CM | POA: Diagnosis not present

## 2018-07-16 DIAGNOSIS — I10 Essential (primary) hypertension: Secondary | ICD-10-CM | POA: Diagnosis not present

## 2018-07-16 DIAGNOSIS — C50919 Malignant neoplasm of unspecified site of unspecified female breast: Secondary | ICD-10-CM | POA: Diagnosis not present

## 2018-07-16 DIAGNOSIS — S72001A Fracture of unspecified part of neck of right femur, initial encounter for closed fracture: Secondary | ICD-10-CM | POA: Diagnosis not present

## 2018-07-16 DIAGNOSIS — G4700 Insomnia, unspecified: Secondary | ICD-10-CM | POA: Diagnosis not present

## 2018-07-16 DIAGNOSIS — N182 Chronic kidney disease, stage 2 (mild): Secondary | ICD-10-CM | POA: Diagnosis not present

## 2018-07-16 DIAGNOSIS — R2681 Unsteadiness on feet: Secondary | ICD-10-CM | POA: Diagnosis not present

## 2018-07-16 DIAGNOSIS — S329XXA Fracture of unspecified parts of lumbosacral spine and pelvis, initial encounter for closed fracture: Secondary | ICD-10-CM | POA: Diagnosis not present

## 2018-07-16 DIAGNOSIS — W19XXXA Unspecified fall, initial encounter: Secondary | ICD-10-CM | POA: Diagnosis not present

## 2018-07-16 DIAGNOSIS — M810 Age-related osteoporosis without current pathological fracture: Secondary | ICD-10-CM | POA: Diagnosis not present

## 2018-07-16 DIAGNOSIS — M1991 Primary osteoarthritis, unspecified site: Secondary | ICD-10-CM | POA: Diagnosis not present

## 2018-07-16 DIAGNOSIS — D500 Iron deficiency anemia secondary to blood loss (chronic): Secondary | ICD-10-CM | POA: Diagnosis not present

## 2018-07-16 LAB — TSH: TSH: 2.73 (ref 0.41–5.90)

## 2018-07-16 LAB — CBC AND DIFFERENTIAL
HCT: 34 — AB (ref 36–46)
Hemoglobin: 11.8 — AB (ref 12.0–16.0)
Platelets: 194 (ref 150–399)
WBC: 5

## 2018-07-16 LAB — BASIC METABOLIC PANEL
BUN: 26 — AB (ref 4–21)
Creatinine: 1.1 (ref 0.5–1.1)
Glucose: 93
Potassium: 4 (ref 3.4–5.3)
Sodium: 123 — AB (ref 137–147)

## 2018-07-16 LAB — VITAMIN D 25 HYDROXY (VIT D DEFICIENCY, FRACTURES): Vit D, 25-Hydroxy: 37

## 2018-07-17 ENCOUNTER — Non-Acute Institutional Stay: Payer: Medicare Other | Admitting: Nurse Practitioner

## 2018-07-17 ENCOUNTER — Encounter: Payer: Self-pay | Admitting: Nurse Practitioner

## 2018-07-17 DIAGNOSIS — I10 Essential (primary) hypertension: Secondary | ICD-10-CM | POA: Diagnosis not present

## 2018-07-17 LAB — COMPLETE METABOLIC PANEL WITH GFR
CO2: 24
Calcium: 9.3
Chloride: 94
EGFR (Non-African Amer.): 44

## 2018-07-17 NOTE — Assessment & Plan Note (Signed)
07/16/18 Na 123, K 4.0, Bun 26, creat 1.09, eGFR 45, TSH 2.73, wbc 5.0, Hgb 11.8, plt 194, neutrophils 55.5 07/17/18 baseline Na 130s  Worsened since HCTZ 12.36m po 06/29/18, will dc HCTZ, BMP 07/19/18, VS q shift x 72 hours.

## 2018-07-17 NOTE — Progress Notes (Signed)
Location:  [facility] Room Number: 35 Place of Service:  ALF (13) Provider:  Marlana Latus  NP  Kamea Dacosta X, NP  Patient Care Team: Alanea Woolridge X, NP as PCP - General (Nurse Practitioner)  Extended Emergency Contact Information Primary Emergency Contact: Broberg,[name] Address: [address] [place], [place] [address] [place] Mobile Phone: [phone] Relation: Son  Code Status:  DNR Goals of care: Advanced Directive information Advanced Directives 07/17/2018  Does Patient Have a Medical Advance Directive? Yes  Type of Paramedic of [place];Out of facility DNR (pink MOST or yellow form)  Does patient want to make changes to medical advance directive? No - Patient declined  Copy of [place] in Chart? Yes - validated most recent copy scanned in chart (See row information)  Would patient like information on creating a medical advance directive? -  Pre-existing out of facility DNR order (yellow form or pink MOST form) Yellow form placed in chart (order not valid for inpatient use);Pink MOST form placed in chart (order not valid for inpatient use)     Chief Complaint  Patient presents with  . Acute Visit    C/o -hyponatremia    HPI:  Pt is a 85 y.o. female seen today for an acute visit for 07/16/18 Na 123, K 4.0, Bun 26, creat 1.09, eGFR 45, TSH 2.73, wbc 5.0, Hgb 11.8, plt 194, neutrophils 55.5 07/17/18 baseline Na 130s  Worsened since HCTZ 12.102m po 06/29/18. The patient denied increased muscle weakness, nausea/vomiting, headaches, confusion, restlessness/irritability, or increased drowsiness/fatigue.   Hx of HTN, blood pressure is reasonably controlled on Amlodipine 59mqd, Atenolol 7523mid, Clonidine 0.1mg24md prn, Hydralazine 100mg1m, HCT 12.5mg q68mIrbesartan 300mg q31m  Past Medical History:  Diagnosis Date  . Acquired cyst of kidney    left  . Allergic rhinitis, cause  unspecified   . Anemia, unspecified   . Chronic hyponatremia 09/05/2015  . CKD stage 2 due to type 2 diabetes mellitus (HCC) 7/[place]016  . Closed fracture of unspecified part of vertebral column without mention of spinal cord injury    T7 s/p kyphoplasty, T12  . Edema 2015  . Herpes simplex without mention of complication   . History of Epstein-Barr virus infection   . Hyposmolality and/or hyponatremia    07/13/15 Na 131  . Insomnia   . Insomnia, unspecified   . Left cavernous carotid aneurysm 08/2008   1-1/2mm ane69msm of cavernous carotid artery  . Malignant neoplasm of breast (female), unspecified site 1990   left. Had surgerey and chemo, but no radiation  . 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 Neuropathy   . Osteoarthrosis of knee    bilaterally  . Osteoarthrosis, hip   . Osteoarthrosis, unspecified whether generalized or localized, forearm    bilaterally wrist  . Other and unspecified nonspecific immunological findings    positive ANA  . Other B-complex deficiencies   . Pain in joint, site unspecified    severe, diffuse, chronic pain  . Positive ANA (antinuclear antibody)   . Pulmonary embolism (HCC) 12/[place]2016  . Pure hypercholesterolemia   . Senile osteoporosis   . Unspecified essential hypertension   . Unspecified gastritis and gastroduodenitis without mention of hemorrhage   . Unspecified hypothyroidism   . Unspecified vitamin D deficiency    Past Surgical History:  Procedure Laterality Date  . BREAST SURGERY Left   . DILATATION & CURETTAGE/HYSTEROSCOPY WITH MYOSURE N/A 06/27/2017   Procedure: DILATATION & CURETTAGE/HYSTEROSCOPY WITH MYOSURE;  Surgeon: Servando Salina, MD;  Location: [facility];  Service: Gynecology;  Laterality: N/A;  . DILATION AND CURETTAGE OF UTERUS  X 2  . FEMUR IM NAIL Right 03/18/2015   Procedure: INTRAMEDULLARY (IM) NAIL FEMORAL;  Surgeon: Rod Can, MD;  Location: [facility];  Service:  Orthopedics;  Laterality: Right;  . FRACTURE SURGERY     hip  . KYPHOPLASTY  07/03/2010   T12  . KYPHOPLASTY     C7  . LAPAROSCOPIC CHOLECYSTECTOMY  09/20/2006  . MASTECTOMY Left 04/29/1989  . MIDDLE EAR SURGERY Bilateral 1938  . NASAL SINUS SURGERY  1990 & 2000  . ORIF HIP FRACTURE Left 06/13/2007   following a syncopal episode  . TONSILLECTOMY  1968    Allergies  Allergen Reactions  . Aspirin     Drop in body temp with large quantities, can tolerate low doses of aspirin  . Penicillins Swelling    Has patient had a PCN reaction causing immediate rash, facial/tongue/throat swelling, SOB or lightheadedness with hypotension: No Has patient had a PCN reaction causing severe rash involving mucus membranes or skin necrosis: No Has patient had a PCN reaction that required hospitalization No Has patient had a PCN reaction occurring within the last 10 years: No If all of the above answers are "NO", then may proceed with Cephalosporin use.    Outpatient Encounter Medications as of 07/17/2018  Medication Sig  . acetaminophen (TYLENOL) 500 MG tablet Take 1,000 mg by mouth daily.   Marland Kitchen acetaminophen (TYLENOL) 500 MG tablet Take 500 mg by mouth every morning.   Marland Kitchen amLODipine (NORVASC) 5 MG tablet Take 5 mg by mouth daily.  Marland Kitchen atenolol (TENORMIN) 50 MG tablet Take 75 mg by mouth 2 (two) times daily. Take 1 and 1/2 tablets = 75 mg. Hold atenolol if HR less than 55 bpm.  . cloNIDine (CATAPRES) 0.1 MG tablet Take 0.1 mg by mouth 2 (two) times daily as needed. Take in the morning and at bedtime.  . famotidine (PEPCID) 10 MG tablet Take 10 mg by mouth at bedtime.  . feeding supplement (BOOST / RESOURCE BREEZE) LIQD Take 1 Container by mouth 2 (two) times daily between meals. (1000 & 1600) Prefers Chocolate  . hydrALAZINE (APRESOLINE) 100 MG tablet Take 100 mg by mouth 3 (three) times daily. If SBP >160 recheck B/P and document in vital sign record.  . hydrochlorothiazide (HYDRODIURIL) 12.5 MG tablet Take  12.5 mg by mouth daily.  Marland Kitchen ipratropium (ATROVENT) 0.06 % nasal spray Place 2 sprays into both nostrils 2 (two) times daily as needed for rhinitis. PRN-NASAL DRAINAGE  . irbesartan (AVAPRO) 300 MG tablet Take 300 mg by mouth daily. HOLD for B/P <11/73, If systolic B/P is over 567, recheck B/P, document in Vital Signs.  Marland Kitchen levothyroxine (SYNTHROID, LEVOTHROID) 150 MCG tablet Take 150 mcg by mouth daily before breakfast. (0730)  . Lidocaine (ASPERCREME LIDOCAINE) 4 % PTCH Apply 3 patches topically once. Apply 1 patch to the left hip, left knee, and the right shoulder in the morning. Remove in the evening.  . loratadine (CLARITIN) 10 MG tablet Take 1 tablet (10 mg total) by mouth daily.  . Menthol, Topical Analgesic, (BIOFREEZE) 4 % GEL Apply 1 application topically 3 (three) times daily as needed. Knee and hand pain  . mirtazapine (REMERON) 30 MG tablet Take 30 mg by mouth  at bedtime.  . ondansetron (ZOFRAN) 4 MG tablet Take 4 mg by mouth every 8 (eight) hours as needed for nausea or vomiting.  . Oxycodone HCl 10 MG TABS Take 10 mg by mouth every 8 (eight) hours as needed.  Marland Kitchen oxyCODONE-acetaminophen (PERCOCET) 10-325 MG tablet Take 1 tablet by mouth every 6 (six) hours as needed for up to 30 days for pain. Take APAP 10 -325 mg PO to 6am, 12pm, 6pm, and 12 am.  . pantoprazole (PROTONIX) 40 MG tablet Take 40 mg by mouth daily.  . polyethylene glycol (MIRALAX / GLYCOLAX) packet Take 17 g by mouth daily.  . promethazine (PHENERGAN) 25 MG suppository Place 25 mg rectally every 6 (six) hours as needed for nausea or vomiting. nausea WITH vomiting x 24 hours. Clear liquid diet x24 hours. If NOT effective, notify MD.  . psyllium (METAMUCIL) 58.6 % powder Take 1 packet by mouth daily. Mix in 6 oz of water or juice  . sennosides-docusate sodium (SENOKOT-S) 8.6-50 MG tablet Take 1 tablet by mouth daily. Hold for loose stool  . sodium chloride 1 g tablet Take 1 g by mouth daily.  Marland Kitchen UNABLE TO FIND Med Name:  Hydrocolloid dressing 4x4. Change dressing to the coccyx as needed. Check patency daily  . zinc oxide 20 % ointment Apply 1 application topically 3 (three) times daily as needed for irritation. Apply to buttocks for redness/dark pink/excoriation   No facility-administered encounter medications on file as of 07/17/2018.    ROS was provided with assistance of staff.  Review of Systems  Constitutional: Negative for activity change, appetite change, chills, diaphoresis, fatigue and fever.  HENT: Positive for hearing loss. Negative for congestion and voice change.   Respiratory: Negative for cough, chest tightness, shortness of breath and wheezing.   Cardiovascular: Positive for leg swelling. Negative for chest pain and palpitations.  Gastrointestinal: Negative for abdominal distention, abdominal pain, constipation, diarrhea, nausea and vomiting.  Genitourinary: Negative for difficulty urinating, dysuria and urgency.  Musculoskeletal: Positive for arthralgias.  Skin: Negative for color change and pallor.  Neurological: Negative for dizziness, syncope, speech difficulty, weakness and headaches.       Memory lapses.   Psychiatric/Behavioral: Negative for agitation, behavioral problems, confusion, hallucinations and sleep disturbance. The patient is not nervous/anxious.     Immunization History  Administered Date(s) Administered  . Influenza Split 02/13/2013  . Influenza-Unspecified 02/27/2014, 02/12/2015, 02/25/2016, 03/08/2017, 02/19/2018  . PPD Test 09/09/2015  . Pneumococcal Conjugate-13 05/05/2016  . Pneumococcal Polysaccharide-23 02/13/2013  . Td 05/05/2016  . Zoster 05/04/2016   Pertinent  Health Maintenance Due  Topic Date Due  . MAMMOGRAM  03/21/1945  . INFLUENZA VACCINE  Completed  . DEXA SCAN  Completed  . PNA vac Low Risk Adult  Completed   Fall Risk  01/08/2018 01/02/2017 11/10/2015 10/20/2015 10/20/2015  Falls in the past year? No No No Yes No  Number falls in past yr: - - - 2 or  more -  Injury with Fall? - - - Yes -  Risk Factor Category  - - - - -  Risk for fall due to : - - - History of fall(s);Impaired balance/gait -  Follow up - - - - -   Functional Status Survey:    Vitals:   07/17/18 0953  BP: (!) 158/83  Pulse: 60  Resp: 16  Temp: (!) 97.3 F (36.3 C)  SpO2: 98%  Weight: 118 lb 3.2 oz (53.6 kg)  Height: '5\' 8"'  (1.727 m)   Body  mass index is 17.97 kg/m. Physical Exam Vitals signs and nursing note reviewed.  Constitutional:      General: She is not in acute distress.    Appearance: Normal appearance. She is not ill-appearing, toxic-appearing or diaphoretic.  HENT:     Head: Normocephalic and atraumatic.     Mouth/Throat:     Mouth: Mucous membranes are moist.  Eyes:     Pupils: Pupils are equal, round, and reactive to light.  Neck:     Musculoskeletal: Normal range of motion and neck supple.  Cardiovascular:     Rate and Rhythm: Normal rate and regular rhythm.     Heart sounds: No murmur.  Pulmonary:     Effort: Pulmonary effort is normal.     Breath sounds: No wheezing, rhonchi or rales.  Abdominal:     General: There is no distension.     Tenderness: There is no abdominal tenderness. There is no guarding or rebound.  Musculoskeletal:     Right lower leg: Edema present.     Left lower leg: Edema present.     Comments: Trace edema BLE  Skin:    General: Skin is warm and dry.  Neurological:     General: No focal deficit present.     Mental Status: She is alert. Mental status is at baseline.     Cranial Nerves: No cranial nerve deficit.     Motor: No weakness.     Coordination: Coordination normal.     Gait: Gait abnormal.     Comments: Oriented to person and place.   Psychiatric:        Mood and Affect: Mood normal.        Behavior: Behavior normal.        Thought Content: Thought content normal.        Judgment: Judgment normal.     Labs reviewed: Recent Labs    11/02/17 12/07/17 01/04/18 01/30/18 07/12/18  NA 130*  130* 131 131* 127*  K 4.0 3.9 3.8 4.0 4.3  CL 96 95  --   --  94  CO2 27 28  --   --  24  BUN 23* 24* 31* 34* 25*  CREATININE 1.1 1.0 1.12 1.1 1.1  CALCIUM 9.1 8.9 8.8  --  9.3   Recent Labs    08/31/17 10/19/17 01/30/18  AST '20 28 18  ' ALT 1.57 16 12  ALKPHOS 53 76 53  BILITOT 0.8 0.8  --   PROT 5.5 6.7  --   ALBUMIN 3.3 3.8  --    Recent Labs    08/31/17 10/24/17 01/04/18  WBC 7 5.2 4.7  HGB 11.4 11.8* 11.8  HCT 32.1 33* 34.6  MCV  --   --  98.6  PLT  --  173  --    Lab Results  Component Value Date   TSH 1.71 01/04/2018   No results found for: HGBA1C Lab Results  Component Value Date   CHOL 175 11/10/2014   HDL 53 11/10/2014   LDLCALC 87 11/10/2014   TRIG 173 (A) 11/10/2014    Significant Diagnostic Results in last 30 days:  No results found.  Assessment/Plan Essential hypertension blood pressure is reasonably controlled, continue Amlodipine 59m qd, Atenolol 720mbid, Clonidine 0.66m70mid prn, Hydralazine 100m44md, Irbesartan 300mg100m VS q shift x 72 hours.    Chronic Hyponatremia 07/16/18 Na 123, K 4.0, Bun 26, creat 1.09, eGFR 45, TSH 2.73, wbc 5.0, Hgb 11.8, plt 194, neutrophils 55.5  07/17/18 baseline Na 130s  Worsened since HCTZ 12.21m po 06/29/18, will dc HCTZ, BMP 07/19/18, VS q shift x 72 hours.       Family/ staff Communication: plan of care reviewed with the patient and charge nurse.   Labs/tests ordered:  BMP 07/19/18  Time spend 25 minutes.

## 2018-07-17 NOTE — Assessment & Plan Note (Signed)
blood pressure is reasonably controlled, continue Amlodipine 5mg  qd, Atenolol 75mg  bid, Clonidine 0.1mg  bid prn, Hydralazine 100mg  tid, Irbesartan 300mg  qd. VS q shift x 72 hours.

## 2018-07-19 DIAGNOSIS — I10 Essential (primary) hypertension: Secondary | ICD-10-CM | POA: Diagnosis not present

## 2018-07-19 LAB — BASIC METABOLIC PANEL
BUN: 27 — AB (ref 4–21)
Creatinine: 1.1 (ref 0.5–1.1)
Glucose: 138
Potassium: 4.3 (ref 3.4–5.3)
Sodium: 127 — AB (ref 137–147)

## 2018-07-27 ENCOUNTER — Encounter: Payer: Self-pay | Admitting: Internal Medicine

## 2018-07-27 ENCOUNTER — Non-Acute Institutional Stay: Payer: Medicare Other | Admitting: Internal Medicine

## 2018-07-27 DIAGNOSIS — E039 Hypothyroidism, unspecified: Secondary | ICD-10-CM

## 2018-07-27 DIAGNOSIS — F418 Other specified anxiety disorders: Secondary | ICD-10-CM

## 2018-07-27 DIAGNOSIS — E871 Hypo-osmolality and hyponatremia: Secondary | ICD-10-CM

## 2018-07-27 DIAGNOSIS — G894 Chronic pain syndrome: Secondary | ICD-10-CM

## 2018-07-27 DIAGNOSIS — I10 Essential (primary) hypertension: Secondary | ICD-10-CM

## 2018-07-27 MED ORDER — FENTANYL 12 MCG/HR TD PT72: 1.0000 | MEDICATED_PATCH | TRANSDERMAL | 0 refills | Status: DC

## 2018-07-27 MED ORDER — FENTANYL 12 MCG/HR TD PT72: 1.0000 | MEDICATED_PATCH | TRANSDERMAL | Status: DC

## 2018-07-27 NOTE — Progress Notes (Signed)
Location:  [facility] Room Number: 35-A Place of Service:  ALF (48) Provider:Jamela Cumbo L,MD   Mast, Man X, NP  Patient Care Team: Mast, Man X, NP as PCP - General (Nurse Practitioner)  Extended Emergency Contact Information Primary Emergency Contact: Geeslin,[name] Address: [address] [place], [place] [address] [place] Mobile Phone: [phone] Relation: Son  Code Status:DNR Goals of care: Advanced Directive information Advanced Directives 07/27/2018  Does Patient Have a Medical Advance Directive? Yes  Type of Paramedic of [place];Out of facility DNR (pink MOST or yellow form);Living will  Does patient want to make changes to medical advance directive? No - Patient declined  Copy of [place] in Chart? Yes - validated most recent copy scanned in chart (See row information)  Would patient like information on creating a medical advance directive? -  Pre-existing out of facility DNR order (yellow form or pink MOST form) Yellow form placed in chart (order not valid for inpatient use);Pink MOST form placed in chart (order not valid for inpatient use)     Chief Complaint  Patient presents with  . Acute Visit    ACP/ discuss blood pressure     HPI:  Pt is a 85 y.o. female seen today for an acute visit for follow up of her Hypertension, Pain in her knees and She wants to Change her orders on MOST form. She is a resident in assisted living and [facility] She has a history of uncontrolled hypertension, severe arthritis depression with anxiety.H/o PE in 2016,   Patient has difficult to manage her BP and sometimes it runs more then 180. Her HCTZ was discontinued due to Hyponatremia. Her Beta blocker dose was reduced also as her HR goes below 55 sometimes She is on number of other Meds She continues to have Bilateral Knee pain due to arthritis. She is on Chronic oxycodone. Her son was in  the facility for meeting to change her MOST form .  She has expressed her wishes to be comfort care. Today her main complaint was bilateral knee pain.  She denied any headache, dizziness.   Past Medical History:  Diagnosis Date  . Acquired cyst of kidney    left  . Allergic rhinitis, cause unspecified   . Anemia, unspecified   . Chronic hyponatremia 09/05/2015  . CKD stage 2 due to type 2 diabetes mellitus ([place]) 11/18/2014  . Closed fracture of unspecified part of vertebral column without mention of spinal cord injury    T7 s/p kyphoplasty, T12  . Edema 2015  . Herpes simplex without mention of complication   . History of Epstein-Barr virus infection   . Hyposmolality and/or hyponatremia    07/13/15 Na 131  . Insomnia   . Insomnia, unspecified   . Left cavernous carotid aneurysm 08/2008   1-1/42mm aneurysm of cavernous carotid artery  . Malignant neoplasm of breast (female), unspecified site 1990   left. Had surgerey and chemo, but no radiation  . 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 Neuropathy   . Osteoarthrosis of knee    bilaterally  . Osteoarthrosis, hip   . Osteoarthrosis, unspecified whether generalized or localized, forearm    bilaterally wrist  . Other and unspecified nonspecific immunological findings    positive ANA  . Other B-complex deficiencies   . Pain in joint, site unspecified  severe, diffuse, chronic pain  . Positive ANA (antinuclear antibody)   . Pulmonary embolism ([place]) 05/07/2015  . Pure hypercholesterolemia   . Senile osteoporosis   . Unspecified essential hypertension   . Unspecified gastritis and gastroduodenitis without mention of hemorrhage   . Unspecified hypothyroidism   . Unspecified vitamin D deficiency    Past Surgical History:  Procedure Laterality Date  . BREAST SURGERY Left   . DILATATION & CURETTAGE/HYSTEROSCOPY WITH MYOSURE N/A 06/27/2017   Procedure: DILATATION & CURETTAGE/HYSTEROSCOPY  WITH MYOSURE;  Surgeon: Servando Salina, MD;  Location: [facility];  Service: Gynecology;  Laterality: N/A;  . DILATION AND CURETTAGE OF UTERUS  X 2  . FEMUR IM NAIL Right 03/18/2015   Procedure: INTRAMEDULLARY (IM) NAIL FEMORAL;  Surgeon: Rod Can, MD;  Location: [facility];  Service: Orthopedics;  Laterality: Right;  . FRACTURE SURGERY     hip  . KYPHOPLASTY  07/03/2010   T12  . KYPHOPLASTY     C7  . LAPAROSCOPIC CHOLECYSTECTOMY  09/20/2006  . MASTECTOMY Left 04/29/1989  . MIDDLE EAR SURGERY Bilateral 1938  . NASAL SINUS SURGERY  1990 & 2000  . ORIF HIP FRACTURE Left 06/13/2007   following a syncopal episode  . TONSILLECTOMY  1968    Allergies  Allergen Reactions  . Aspirin     Drop in body temp with large quantities, can tolerate low doses of aspirin  . Penicillins Swelling    Has patient had a PCN reaction causing immediate rash, facial/tongue/throat swelling, SOB or lightheadedness with hypotension: No Has patient had a PCN reaction causing severe rash involving mucus membranes or skin necrosis: No Has patient had a PCN reaction that required hospitalization No Has patient had a PCN reaction occurring within the last 10 years: No If all of the above answers are "NO", then may proceed with Cephalosporin use.    Outpatient Encounter Medications as of 07/27/2018  Medication Sig  . acetaminophen (TYLENOL) 500 MG tablet Take 1,000 mg by mouth daily.   Marland Kitchen acetaminophen (TYLENOL) 500 MG tablet Take 500 mg by mouth every morning.   Marland Kitchen amLODipine (NORVASC) 5 MG tablet Take 5 mg by mouth daily.  Marland Kitchen atenolol (TENORMIN) 50 MG tablet Take 75 mg by mouth daily. Take 1 and 1/2 tablets = 75 mg. Hold atenolol if HR less than 55 bpm.   . cloNIDine (CATAPRES) 0.1 MG tablet Take 0.1 mg by mouth 2 (two) times daily as needed. Take in the morning and at bedtime.  . famotidine (PEPCID) 10 MG tablet Take 10 mg by mouth at bedtime.  . feeding supplement (BOOST / RESOURCE BREEZE) LIQD Take 1 Container by  mouth 2 (two) times daily between meals. (1000 & 1600) Prefers Chocolate  . hydrALAZINE (APRESOLINE) 100 MG tablet Take 100 mg by mouth 3 (three) times daily. If SBP >160 recheck B/P and document in vital sign record.  Marland Kitchen ipratropium (ATROVENT) 0.06 % nasal spray Place 2 sprays into both nostrils 2 (two) times daily as needed for rhinitis. PRN-NASAL DRAINAGE  . irbesartan (AVAPRO) 300 MG tablet Take 300 mg by mouth daily. HOLD for B/P <65/68, If systolic B/P is over 127, recheck B/P, document in Vital Signs.  Marland Kitchen levothyroxine (SYNTHROID, LEVOTHROID) 150 MCG tablet Take 150 mcg by mouth daily before breakfast. (0730)  . Lidocaine (ASPERCREME LIDOCAINE) 4 % PTCH Apply 3 patches topically once. Apply 1 patch to the left hip, left knee, and the right shoulder in the morning. Remove in the evening.  . loratadine (CLARITIN)  10 MG tablet Take 1 tablet (10 mg total) by mouth daily.  . Menthol, Topical Analgesic, (BIOFREEZE) 4 % GEL Apply 1 application topically 3 (three) times daily as needed. Knee and hand pain  . mirtazapine (REMERON) 30 MG tablet Take 30 mg by mouth at bedtime.  . ondansetron (ZOFRAN) 4 MG tablet Take 4 mg by mouth every 8 (eight) hours as needed for nausea or vomiting.  . Oxycodone HCl 10 MG TABS Take 10 mg by mouth every 8 (eight) hours as needed.  Marland Kitchen oxyCODONE-acetaminophen (PERCOCET) 10-325 MG tablet Take 1 tablet by mouth every 6 (six) hours as needed for up to 30 days for pain. Take APAP 10 -325 mg PO to 6am, 12pm, 6pm, and 12 am.  . pantoprazole (PROTONIX) 40 MG tablet Take 40 mg by mouth daily.  . polyethylene glycol (MIRALAX / GLYCOLAX) packet Take 17 g by mouth daily.  . promethazine (PHENERGAN) 25 MG suppository Place 25 mg rectally every 6 (six) hours as needed for nausea or vomiting. nausea WITH vomiting x 24 hours. Clear liquid diet x24 hours. If NOT effective, notify MD.  . psyllium (METAMUCIL) 58.6 % powder Take 1 packet by mouth daily. Mix in 6 oz of water or juice  .  sennosides-docusate sodium (SENOKOT-S) 8.6-50 MG tablet Take 1 tablet by mouth daily. Hold for loose stool  . sodium chloride 1 g tablet Take 1 g by mouth daily.  Marland Kitchen UNABLE TO FIND Med Name: Hydrocolloid dressing 4x4. Change dressing to the coccyx as needed. Check patency daily  . zinc oxide 20 % ointment Apply 1 application topically 3 (three) times daily as needed for irritation. Apply to buttocks for redness/dark pink/excoriation  . hydrochlorothiazide (HYDRODIURIL) 12.5 MG tablet Take 12.5 mg by mouth daily.   No facility-administered encounter medications on file as of 07/27/2018.     Review of Systems  Constitutional: Negative.   HENT: Negative.   Respiratory: Negative.   Cardiovascular: Negative.   Gastrointestinal: Negative.   Genitourinary: Negative.   Musculoskeletal: Positive for arthralgias and myalgias.  Neurological: Negative.   Psychiatric/Behavioral: Negative.  Negative for dysphoric mood.  All other systems reviewed and are negative.   Immunization History  Administered Date(s) Administered  . Influenza Split 02/13/2013  . Influenza-Unspecified 02/27/2014, 02/12/2015, 02/25/2016, 03/08/2017, 02/19/2018  . PPD Test 09/09/2015  . Pneumococcal Conjugate-13 05/05/2016  . Pneumococcal Polysaccharide-23 02/13/2013  . Td 05/05/2016  . Zoster 05/04/2016   Pertinent  Health Maintenance Due  Topic Date Due  . MAMMOGRAM  03/21/1945  . INFLUENZA VACCINE  Completed  . DEXA SCAN  Completed  . PNA vac Low Risk Adult  Completed   Fall Risk  01/08/2018 01/02/2017 11/10/2015 10/20/2015 10/20/2015  Falls in the past year? No No No Yes No  Number falls in past yr: - - - 2 or more -  Injury with Fall? - - - Yes -  Risk Factor Category  - - - - -  Risk for fall due to : - - - History of fall(s);Impaired balance/gait -  Follow up - - - - -   Functional Status Survey:    Vitals:   07/27/18 1246  BP: (!) 180/86  Pulse: 60  Resp: 20  Temp: 98.2 F (36.8 C)  SpO2: 98%  Weight:  119 lb 6.4 oz (54.2 kg)  Height: 5\' 8"  (1.727 m)   Body mass index is 18.15 kg/m. Physical Exam Vitals signs reviewed.  Constitutional:      Appearance: Normal appearance.  HENT:  Head: Normocephalic.     Nose: Nose normal.     Mouth/Throat:     Mouth: Mucous membranes are moist.     Pharynx: Oropharynx is clear.  Eyes:     Pupils: Pupils are equal, round, and reactive to light.  Cardiovascular:     Pulses: Normal pulses.     Heart sounds: Normal heart sounds.  Pulmonary:     Effort: Pulmonary effort is normal.     Breath sounds: Normal breath sounds.  Abdominal:     General: Abdomen is flat. Bowel sounds are normal.     Palpations: Abdomen is soft.  Musculoskeletal:        General: Swelling present.     Comments: Both Knees did not have any swelling, Redness . And no restriction in mobility or Range of motion Has mild bilateral mild swelling  Skin:    General: Skin is warm and dry.  Neurological:     General: No focal deficit present.     Mental Status: She is alert and oriented to person, place, and time.     Labs reviewed: Recent Labs    11/02/17 12/07/17 01/04/18 01/30/18 07/12/18  NA 130* 130* 131 131* 127*  K 4.0 3.9 3.8 4.0 4.3  CL 96 95  --   --  94  CO2 27 28  --   --  24  BUN 23* 24* 31* 34* 25*  CREATININE 1.1 1.0 1.12 1.1 1.1  CALCIUM 9.1 8.9 8.8  --  9.3   Recent Labs    08/31/17 10/19/17 01/30/18  AST 20 28 18   ALT 1.57 16 12  ALKPHOS 53 76 53  BILITOT 0.8 0.8  --   PROT 5.5 6.7  --   ALBUMIN 3.3 3.8  --    Recent Labs    08/31/17 10/24/17 01/04/18  WBC 7 5.2 4.7  HGB 11.4 11.8* 11.8  HCT 32.1 33* 34.6  MCV  --   --  98.6  PLT  --  173  --    Lab Results  Component Value Date   TSH 1.71 01/04/2018   No results found for: HGBA1C Lab Results  Component Value Date   CHOL 175 11/10/2014   HDL 53 11/10/2014   LDLCALC 87 11/10/2014   TRIG 173 (A) 11/10/2014    Significant Diagnostic Results in last 30 days:  No results found.   Assessment/Plan Hyponatremia Her sodium dropped to 123 on HCTZ Her repeat sodium is 127 On Sodium tabs HCTZ discontinued  Essential hypertension Sheis on a number of medications including hydralazine, clonidine, atenolol, Avapro Will increase her Norvasc to 10 mg Decreased atenolol  50 mg Qd due to Bradycardia Reeval in few days Hypothyroidism, unspecified type TSH in Normal 08/19 Repeat TSH 2.7 in 02/20 Primary osteoarthritis involving multiple joints On high-dose of Tylenol and Percocet Also now on Oxycodone 10 mg Q8 Prn Xrays of Knee showed Arthritis As she still continues to have pain will try Duragesic patch 12.5 mcg for Pain Control History of pulmonary embolism Finished her course of Eliquis and was taken off few months ago Patient continues to be stable  Depression with anxiety Is depressed is on Remeron No SSRi due to Hyponatremia ACP D/W the Son . Patient wants to be comfort care MOST Form signed  Family/ staff Communication:  Labs/tests ordered:   Total time spent in this patient care encounter was 40_ minutes; greater than 50% of the visit spent counseling patient, reviewing records , Labs and coordinating  care for problems addressed at this encounter.

## 2018-08-03 ENCOUNTER — Other Ambulatory Visit: Payer: Self-pay

## 2018-08-03 MED ORDER — FENTANYL 12 MCG/HR TD PT72: 1.0000 | MEDICATED_PATCH | TRANSDERMAL | 0 refills | Status: DC

## 2018-08-10 ENCOUNTER — Other Ambulatory Visit: Payer: Self-pay | Admitting: *Deleted

## 2018-08-10 MED ORDER — OXYCODONE-ACETAMINOPHEN 10-325 MG PO TABS: 1.0000 | ORAL_TABLET | Freq: Four times a day (QID) | ORAL | 0 refills | Status: AC | PRN

## 2018-08-20 DIAGNOSIS — I10 Essential (primary) hypertension: Secondary | ICD-10-CM | POA: Diagnosis not present

## 2018-08-21 ENCOUNTER — Encounter: Payer: Self-pay | Admitting: Family

## 2018-08-21 ENCOUNTER — Non-Acute Institutional Stay: Payer: Medicare Other | Admitting: Family

## 2018-08-21 DIAGNOSIS — H6122 Impacted cerumen, left ear: Secondary | ICD-10-CM | POA: Diagnosis not present

## 2018-08-21 DIAGNOSIS — E871 Hypo-osmolality and hyponatremia: Secondary | ICD-10-CM

## 2018-08-21 NOTE — Progress Notes (Signed)
Location:  [facility] Room Number: 35A Place of Service:  ALF (13) Provider: Dinah Ngetich FNP-C  Mast, Man X, NP  Patient Care Team: Mast, Man X, NP as PCP - General (Nurse Practitioner)  Extended Emergency Contact Information Primary Emergency Contact: Whitsel,[name] Address: [address] [place], [place] [address] [place] Mobile Phone: [phone] Relation: Son  Code Status:  DNR Goals of care: Advanced Directive information Advanced Directives 08/21/2018  Does Patient Have a Medical Advance Directive? Yes  Type of Paramedic of [place];Living will;Out of facility DNR (pink MOST or yellow form)  Does patient want to make changes to medical advance directive? No - Guardian declined  Copy of [place] in Chart? Yes - validated most recent copy scanned in chart (See row information)  Would patient like information on creating a medical advance directive? -  Pre-existing out of facility DNR order (yellow form or pink MOST form) Yellow form placed in chart (order not valid for inpatient use);Pink MOST form placed in chart (order not valid for inpatient use)     Chief Complaint  Patient presents with  . Acute Visit    Abnormal Labs    HPI:  Pt is a 85 y.o. female seen today at [facility] for an acute visit for evaluation of abnormal lab results.she is seen in her room today with facility Nurse supervisor present at bedside.she states her chronic knee pain has improved with use of fentanyl patch.she states has been able to sleep well at night.she also walks more on facility hallways with her walker. Her BMP resulted Na + level 125 ( 08/20/2018) previous level was 127 she is currently on sodium chloride 1 Gm tablet daily.she states her legs have been swollen more than usual.Her hydrochlorothiazide was discontinued 07/17/2018 by mast M.NP due to hyponatremia.No abrupt weight gain.she  states does not like to wear Ted hose but will try to keep legs elevated.   Past Medical History:  Diagnosis Date  . Acquired cyst of kidney    left  . Allergic rhinitis, cause unspecified   . Anemia, unspecified   . Chronic hyponatremia 09/05/2015  . CKD stage 2 due to type 2 diabetes mellitus ([place]) 11/18/2014  . Closed fracture of unspecified part of vertebral column without mention of spinal cord injury    T7 s/p kyphoplasty, T12  . Edema 2015  . Herpes simplex without mention of complication   . History of Epstein-Barr virus infection   . Hyposmolality and/or hyponatremia    07/13/15 Na 131  . Insomnia   . Insomnia, unspecified   . Left cavernous carotid aneurysm 08/2008   1-1/75mm aneurysm of cavernous carotid artery  . Malignant neoplasm of breast (female), unspecified site 1990   left. Had surgerey and chemo, but no radiation  . 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 Neuropathy   . Osteoarthrosis of knee    bilaterally  . Osteoarthrosis, hip   . Osteoarthrosis, unspecified whether generalized or localized, forearm    bilaterally wrist  . Other and unspecified nonspecific immunological findings    positive ANA  . Other B-complex deficiencies   . Pain in joint, site unspecified    severe, diffuse, chronic pain  . Positive ANA (antinuclear antibody)   . Pulmonary embolism ([place]) 05/07/2015  . Pure hypercholesterolemia   . Senile osteoporosis   .  Unspecified essential hypertension   . Unspecified gastritis and gastroduodenitis without mention of hemorrhage   . Unspecified hypothyroidism   . Unspecified vitamin D deficiency    Past Surgical History:  Procedure Laterality Date  . BREAST SURGERY Left   . DILATATION & CURETTAGE/HYSTEROSCOPY WITH MYOSURE N/A 06/27/2017   Procedure: DILATATION & CURETTAGE/HYSTEROSCOPY WITH MYOSURE;  Surgeon: Servando Salina, MD;  Location: [facility];  Service: Gynecology;  Laterality: N/A;  .  DILATION AND CURETTAGE OF UTERUS  X 2  . FEMUR IM NAIL Right 03/18/2015   Procedure: INTRAMEDULLARY (IM) NAIL FEMORAL;  Surgeon: Rod Can, MD;  Location: [facility];  Service: Orthopedics;  Laterality: Right;  . FRACTURE SURGERY     hip  . KYPHOPLASTY  07/03/2010   T12  . KYPHOPLASTY     C7  . LAPAROSCOPIC CHOLECYSTECTOMY  09/20/2006  . MASTECTOMY Left 04/29/1989  . MIDDLE EAR SURGERY Bilateral 1938  . NASAL SINUS SURGERY  1990 & 2000  . ORIF HIP FRACTURE Left 06/13/2007   following a syncopal episode  . TONSILLECTOMY  1968    Allergies  Allergen Reactions  . Aspirin     Drop in body temp with large quantities, can tolerate low doses of aspirin  . Hctz [Hydrochlorothiazide] Other (See Comments)    Hyponatremia  . Penicillins Swelling    Has patient had a PCN reaction causing immediate rash, facial/tongue/throat swelling, SOB or lightheadedness with hypotension: No Has patient had a PCN reaction causing severe rash involving mucus membranes or skin necrosis: No Has patient had a PCN reaction that required hospitalization No Has patient had a PCN reaction occurring within the last 10 years: No If all of the above answers are "NO", then may proceed with Cephalosporin use.    Outpatient Encounter Medications as of 08/21/2018  Medication Sig  . acetaminophen (TYLENOL) 500 MG tablet Take 1,000 mg by mouth daily.   Marland Kitchen acetaminophen (TYLENOL) 500 MG tablet Take 500 mg by mouth every morning.   Marland Kitchen amLODipine (NORVASC) 10 MG tablet Take 10 mg by mouth daily. Hold if HR <55  . atenolol (TENORMIN) 50 MG tablet Take 50 mg by mouth daily. Hold atenolol if HR less than 55 bpm.  . cloNIDine (CATAPRES) 0.1 MG tablet Take 0.1 mg by mouth 2 (two) times daily as needed. Take in the morning and at bedtime.  . famotidine (PEPCID) 10 MG tablet Take 10 mg by mouth at bedtime.  . feeding supplement (BOOST / RESOURCE BREEZE) LIQD Take 1 Container by mouth 2 (two) times daily between meals. (1000 & 1600)  Prefers Chocolate  . fentaNYL (DURAGESIC) 12 MCG/HR Place 1 patch onto the skin every 3 (three) days.  . hydrALAZINE (APRESOLINE) 100 MG tablet Take 100 mg by mouth 3 (three) times daily. If SBP >160 recheck B/P and document in vital sign record.  Marland Kitchen ipratropium (ATROVENT) 0.06 % nasal spray Place 2 sprays into both nostrils 2 (two) times daily as needed for rhinitis. PRN-NASAL DRAINAGE  . irbesartan (AVAPRO) 300 MG tablet Take 300 mg by mouth daily. HOLD for B/P <51/02, If systolic B/P is over 585, recheck B/P, document in Vital Signs.  Marland Kitchen levothyroxine (SYNTHROID, LEVOTHROID) 150 MCG tablet Take 150 mcg by mouth daily before breakfast. (0730)  . Lidocaine (ASPERCREME LIDOCAINE) 4 % PTCH Apply 3 patches topically once. Apply 1 patch to the left hip, left knee, and the right shoulder in the morning. Remove in the evening.  . loratadine (CLARITIN) 10 MG tablet Take 1 tablet (10  mg total) by mouth daily.  . Menthol, Topical Analgesic, (BIOFREEZE) 4 % GEL Apply 1 application topically 3 (three) times daily as needed. Knee and hand pain  . mirtazapine (REMERON) 30 MG tablet Take 30 mg by mouth at bedtime.  . ondansetron (ZOFRAN) 4 MG tablet Take 4 mg by mouth every 8 (eight) hours as needed for nausea or vomiting.  . Oxycodone HCl 10 MG TABS Take 10 mg by mouth every 8 (eight) hours as needed.  Marland Kitchen oxyCODONE-acetaminophen (PERCOCET) 10-325 MG tablet Take 1 tablet by mouth every 6 (six) hours as needed for up to 30 days for pain. Take APAP 10 -325 mg PO to 6am, 12pm, 6pm, and 12 am.  . pantoprazole (PROTONIX) 40 MG tablet Take 40 mg by mouth daily.  . polyethylene glycol (MIRALAX / GLYCOLAX) packet Take 17 g by mouth daily.  . promethazine (PHENERGAN) 25 MG suppository Place 25 mg rectally every 6 (six) hours as needed for nausea or vomiting. nausea WITH vomiting x 24 hours. Clear liquid diet x24 hours. If NOT effective, notify MD.  . psyllium (METAMUCIL) 58.6 % powder Take 1 packet by mouth daily. Mix in 6  oz of water or juice  . sennosides-docusate sodium (SENOKOT-S) 8.6-50 MG tablet Take 1 tablet by mouth daily. Hold for loose stool  . sodium chloride 1 g tablet Take 1 g by mouth daily.  Marland Kitchen UNABLE TO FIND Med Name: Hydrocolloid dressing 4x4. Change dressing to the coccyx as needed. Check patency daily  . zinc oxide 20 % ointment Apply 1 application topically 3 (three) times daily as needed for irritation. Apply to buttocks for redness/dark pink/excoriation  . [DISCONTINUED] amLODipine (NORVASC) 5 MG tablet Take 5 mg by mouth daily.   No facility-administered encounter medications on file as of 08/21/2018.     Review of Systems  Constitutional: Negative for appetite change, chills, fatigue, fever and unexpected weight change.  Respiratory: Negative for cough, chest tightness, shortness of breath and wheezing.   Gastrointestinal: Negative for abdominal distention, abdominal pain, constipation, diarrhea, nausea and vomiting.  Musculoskeletal: Positive for arthralgias and gait problem.  Skin: Negative for color change, pallor and rash.  Neurological: Negative for dizziness, light-headedness and headaches.  Psychiatric/Behavioral: Negative for agitation, confusion and sleep disturbance. The patient is not nervous/anxious.     Immunization History  Administered Date(s) Administered  . Influenza Split 02/13/2013  . Influenza-Unspecified 02/27/2014, 02/12/2015, 02/25/2016, 03/08/2017, 02/19/2018  . PPD Test 09/09/2015  . Pneumococcal Conjugate-13 05/05/2016  . Pneumococcal Polysaccharide-23 02/13/2013  . Td 05/05/2016  . Zoster 05/04/2016   Pertinent  Health Maintenance Due  Topic Date Due  . MAMMOGRAM  03/21/1945  . INFLUENZA VACCINE  12/15/2018  . DEXA SCAN  Completed  . PNA vac Low Risk Adult  Completed   Fall Risk  01/08/2018 01/02/2017 11/10/2015 10/20/2015 10/20/2015  Falls in the past year? No No No Yes No  Number falls in past yr: - - - 2 or more -  Injury with Fall? - - - Yes -  Risk  Factor Category  - - - - -  Risk for fall due to : - - - History of fall(s);Impaired balance/gait -  Follow up - - - - -    Vitals:   08/21/18 1138  BP: 136/84  Pulse: 62  Resp: 18  Temp: 98.2 F (36.8 C)  TempSrc: Oral  SpO2: 96%  Weight: 117 lb 6.4 oz (53.3 kg)  Height: 5\' 8"  (1.727 m)   Body mass index  is 17.85 kg/m. Physical Exam Vitals signs and nursing note reviewed.  Constitutional:      General: She is not in acute distress.    Appearance: She is underweight. She is not ill-appearing.  HENT:     Head: Normocephalic.     Right Ear: Tympanic membrane, ear canal and external ear normal. There is no impacted cerumen.     Left Ear: There is impacted cerumen.     Ears:     Comments: Left ear TM not visualized due to cerumen impaction.     Nose: Nose normal. No congestion or rhinorrhea.     Mouth/Throat:     Mouth: Mucous membranes are moist.     Pharynx: Oropharynx is clear. No oropharyngeal exudate or posterior oropharyngeal erythema.  Eyes:     General: No scleral icterus.       Right eye: No discharge.        Left eye: No discharge.     Conjunctiva/sclera: Conjunctivae normal.     Pupils: Pupils are equal, round, and reactive to light.  Cardiovascular:     Rate and Rhythm: Normal rate and regular rhythm.     Pulses: Normal pulses.     Heart sounds: Normal heart sounds. No murmur. No friction rub. No gallop.   Pulmonary:     Effort: No respiratory distress.     Breath sounds: Normal breath sounds. No wheezing, rhonchi or rales.  Chest:     Chest wall: No tenderness.  Abdominal:     General: Bowel sounds are normal. There is no distension.     Palpations: Abdomen is soft. There is no mass.     Tenderness: There is no abdominal tenderness. There is no right CVA tenderness, left CVA tenderness, guarding or rebound.  Musculoskeletal:        General: No tenderness.     Comments: Unsteady gait ambulates with Rolator.bilateral lower extremities 1+ edema.  Skin:     General: Skin is warm and dry.     Coloration: Skin is not pale.     Findings: No erythema or rash.  Neurological:     Mental Status: She is alert and oriented to person, place, and time.     Cranial Nerves: No cranial nerve deficit.     Sensory: No sensory deficit.     Motor: No weakness.     Coordination: Coordination normal.     Gait: Gait abnormal.  Psychiatric:        Mood and Affect: Mood normal.        Behavior: Behavior normal.        Thought Content: Thought content normal.        Judgment: Judgment normal.    Labs reviewed: Recent Labs    11/02/17 12/07/17 01/04/18  07/12/18 07/16/18 07/19/18  NA 130* 130* 131   < > 127* 123* 127*  K 4.0 3.9 3.8   < > 4.3 4.0 4.3  CL 96 95  --   --  94  --   --   CO2 27 28  --   --  24  --   --   BUN 23* 24* 31*   < > 25* 26* 27*  CREATININE 1.1 1.0 1.12   < > 1.1 1.1 1.1  CALCIUM 9.1 8.9 8.8  --  9.3  --   --    < > = values in this interval not displayed.   Recent Labs    08/31/17 10/19/17 01/30/18  AST  20 28 18   ALT 1.57 16 12  ALKPHOS 53 76 53  BILITOT 0.8 0.8  --   PROT 5.5 6.7  --   ALBUMIN 3.3 3.8  --    Recent Labs    10/24/17 01/04/18 07/16/18  WBC 5.2 4.7 5.0  HGB 11.8* 11.8 11.8*  HCT 33* 34.6 34*  MCV  --  98.6  --   PLT 173  --  194   Lab Results  Component Value Date   TSH 2.73 07/16/2018   No results found for: HGBA1C Lab Results  Component Value Date   CHOL 175 11/10/2014   HDL 53 11/10/2014   LDLCALC 87 11/10/2014   TRIG 173 (A) 11/10/2014    Significant Diagnostic Results in last 30 days:  No results found.  Assessment/Plan  1. Chronic Hyponatremia Na 125 (08/20/2018) previous level was 127 -Discontinue sodium chloride 1 gm tablet daily  - start sodium chloride 1 gm tablet twice daily  - recheck BMP 08/23/2018   2. Bilateral lower extremities edema  No cough,wheezing,shortness of breath or abrupt weight gain.Recommeded knee high ted hose but patient refused.encouraged to keep legs  elevated for now.Continue to monitor for abrupt weight gain/shortness of breath.  Family/ staff Communication: Reviewed plan of care with patient and facility Nurse supervisor  Labs/tests ordered:  BMP 08/23/2018   Sandrea Hughs, NP

## 2018-08-22 ENCOUNTER — Other Ambulatory Visit: Payer: Self-pay | Admitting: *Deleted

## 2018-08-22 MED ORDER — FENTANYL 12 MCG/HR TD PT72: 1.0000 | MEDICATED_PATCH | TRANSDERMAL | 0 refills | Status: DC

## 2018-08-23 DIAGNOSIS — I10 Essential (primary) hypertension: Secondary | ICD-10-CM | POA: Diagnosis not present

## 2018-09-13 ENCOUNTER — Other Ambulatory Visit: Payer: Self-pay | Admitting: *Deleted

## 2018-09-13 DIAGNOSIS — I10 Essential (primary) hypertension: Secondary | ICD-10-CM | POA: Diagnosis not present

## 2018-09-13 LAB — BASIC METABOLIC PANEL
BUN: 17 (ref 4–21)
Creatinine: 0.9 (ref 0.5–1.1)
Glucose: 95
Potassium: 3.5 (ref 3.4–5.3)
Sodium: 130 — AB (ref 137–147)

## 2018-09-13 MED ORDER — OXYCODONE-ACETAMINOPHEN 10-325 MG PO TABS: ORAL_TABLET | ORAL | 0 refills | Status: DC

## 2018-09-13 NOTE — Telephone Encounter (Signed)
Received fax Refill Request for Oxycodone-APAP 10/325 Take one tablet by mouth QID.   Pended Rx and sent to [facility] for approval.

## 2018-09-17 DIAGNOSIS — I10 Essential (primary) hypertension: Secondary | ICD-10-CM | POA: Diagnosis not present

## 2018-09-17 LAB — BASIC METABOLIC PANEL
BUN: 16 (ref 4–21)
Creatinine: 0.9 (ref 0.5–1.1)
Glucose: 89
Potassium: 3.8 (ref 3.4–5.3)
Sodium: 130 — AB (ref 137–147)

## 2018-09-21 ENCOUNTER — Other Ambulatory Visit: Payer: Self-pay | Admitting: *Deleted

## 2018-09-21 MED ORDER — FENTANYL 12 MCG/HR TD PT72: 1.0000 | MEDICATED_PATCH | TRANSDERMAL | 0 refills | Status: DC

## 2018-09-26 ENCOUNTER — Encounter: Payer: Self-pay | Admitting: Internal Medicine

## 2018-09-26 ENCOUNTER — Non-Acute Institutional Stay: Payer: Medicare Other | Admitting: Internal Medicine

## 2018-09-26 DIAGNOSIS — I10 Essential (primary) hypertension: Secondary | ICD-10-CM | POA: Diagnosis not present

## 2018-09-26 DIAGNOSIS — N183 Chronic kidney disease, stage 3 unspecified: Secondary | ICD-10-CM

## 2018-09-26 DIAGNOSIS — E039 Hypothyroidism, unspecified: Secondary | ICD-10-CM

## 2018-09-26 DIAGNOSIS — F418 Other specified anxiety disorders: Secondary | ICD-10-CM

## 2018-09-26 DIAGNOSIS — E871 Hypo-osmolality and hyponatremia: Secondary | ICD-10-CM

## 2018-09-26 NOTE — Progress Notes (Addendum)
Location:  [facility] Room Number: 35/A Place of Service:  ALF (13) Provider:Mayte Diers L.MD   Virgie Dad, MD  Patient Care Team: Virgie Dad, MD as PCP - General (Internal Medicine) Mast, Man X, NP as Nurse Practitioner (Internal Medicine)  Extended Emergency Contact Information Primary Emergency Contact: Such,[name] Address: [address] [place], [place] [address] [place] Mobile Phone: [phone] Relation: Son  Code Status:DNR Goals of care: Advanced Directive information Advanced Directives 09/26/2018  Does Patient Have a Medical Advance Directive? Yes  Type of Paramedic of [place];Out of facility DNR (pink MOST or yellow form);Living will  Does patient want to make changes to medical advance directive? No - Patient declined  Copy of [place] in Chart? Yes - validated most recent copy scanned in chart (See row information)  Would patient like information on creating a medical advance directive? -  Pre-existing out of facility DNR order (yellow form or pink MOST form) Yellow form placed in chart (order not valid for inpatient use);Pink MOST form placed in chart (order not valid for inpatient use)     Chief Complaint  Patient presents with  . Medical Management of Chronic Issues    Routine visit     HPI:  Pt is a 85 y.o. female seen today for medical management of chronic diseases.    Patient is resident of AL in [facility] She has a history of uncontrolled hypertension, severe arthritis depression with anxiety.H/o PE in 2016, Per nurses Patient has been doing well on Duragesic Patch which was started to help her Chronic Knee pain. She is able to walk with the walker. Her BP is better controlled also. She was c/o Pain to me today. No Other complains. Denies any SOB or cough or Chest pain or fever Her weight is stable Past Medical History:  Diagnosis Date  .  Acquired cyst of kidney    left  . Allergic rhinitis, cause unspecified   . Anemia, unspecified   . Chronic hyponatremia 09/05/2015  . CKD stage 2 due to type 2 diabetes mellitus ([place]) 11/18/2014  . Closed fracture of unspecified part of vertebral column without mention of spinal cord injury    T7 s/p kyphoplasty, T12  . Edema 2015  . Herpes simplex without mention of complication   . History of Epstein-Barr virus infection   . Hyposmolality and/or hyponatremia    07/13/15 Na 131  . Insomnia   . Insomnia, unspecified   . Left cavernous carotid aneurysm 08/2008   1-1/64mm aneurysm of cavernous carotid artery  . Malignant neoplasm of breast (female), unspecified site 1990   left. Had surgerey and chemo, but no radiation  . 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 Neuropathy   . Osteoarthrosis of knee    bilaterally  . Osteoarthrosis, hip   . Osteoarthrosis, unspecified whether generalized or localized, forearm    bilaterally wrist  . Other and unspecified nonspecific immunological findings    positive ANA  . Other B-complex deficiencies   . Pain in joint, site unspecified    severe, diffuse, chronic pain  . Positive ANA (antinuclear antibody)   . Pulmonary embolism ([place]) 05/07/2015  . Pure hypercholesterolemia   . Senile osteoporosis   . Unspecified essential hypertension   . Unspecified gastritis and gastroduodenitis without mention of hemorrhage   . Unspecified  hypothyroidism   . Unspecified vitamin D deficiency    Past Surgical History:  Procedure Laterality Date  . BREAST SURGERY Left   . DILATATION & CURETTAGE/HYSTEROSCOPY WITH MYOSURE N/A 06/27/2017   Procedure: DILATATION & CURETTAGE/HYSTEROSCOPY WITH MYOSURE;  Surgeon: Servando Salina, MD;  Location: [facility];  Service: Gynecology;  Laterality: N/A;  . DILATION AND CURETTAGE OF UTERUS  X 2  . FEMUR IM NAIL Right 03/18/2015   Procedure: INTRAMEDULLARY (IM) NAIL FEMORAL;   Surgeon: Rod Can, MD;  Location: [facility];  Service: Orthopedics;  Laterality: Right;  . FRACTURE SURGERY     hip  . KYPHOPLASTY  07/03/2010   T12  . KYPHOPLASTY     C7  . LAPAROSCOPIC CHOLECYSTECTOMY  09/20/2006  . MASTECTOMY Left 04/29/1989  . MIDDLE EAR SURGERY Bilateral 1938  . NASAL SINUS SURGERY  1990 & 2000  . ORIF HIP FRACTURE Left 06/13/2007   following a syncopal episode  . TONSILLECTOMY  1968    Allergies  Allergen Reactions  . Aspirin     Drop in body temp with large quantities, can tolerate low doses of aspirin  . Hctz [Hydrochlorothiazide] Other (See Comments)    Hyponatremia  . Penicillins Swelling    Has patient had a PCN reaction causing immediate rash, facial/tongue/throat swelling, SOB or lightheadedness with hypotension: No Has patient had a PCN reaction causing severe rash involving mucus membranes or skin necrosis: No Has patient had a PCN reaction that required hospitalization No Has patient had a PCN reaction occurring within the last 10 years: No If all of the above answers are "NO", then may proceed with Cephalosporin use.    Outpatient Encounter Medications as of 09/26/2018  Medication Sig  . acetaminophen (TYLENOL) 500 MG tablet Take 1,000 mg by mouth daily.   Marland Kitchen acetaminophen (TYLENOL) 500 MG tablet Take 500 mg by mouth every morning.   Marland Kitchen amLODipine (NORVASC) 10 MG tablet Take 10 mg by mouth daily. Hold if HR <55  . atenolol (TENORMIN) 50 MG tablet Take 25 mg by mouth daily. Hold atenolol if HR less than 55 bpm.  . cloNIDine (CATAPRES) 0.1 MG tablet Take 0.1 mg by mouth 2 (two) times daily as needed. Take in the morning and at bedtime.  . famotidine (PEPCID) 10 MG tablet Take 10 mg by mouth at bedtime.  . feeding supplement (BOOST / RESOURCE BREEZE) LIQD Take 1 Container by mouth 2 (two) times daily between meals. (1000 & 1600) Prefers Chocolate  . fentaNYL (DURAGESIC) 12 MCG/HR Place 1 patch onto the skin every 3 (three) days for 30 days.  .  hydrALAZINE (APRESOLINE) 100 MG tablet Take 125 mg by mouth 3 (three) times daily. If SBP >160 recheck B/P and document in vital sign record.   Marland Kitchen ipratropium (ATROVENT) 0.06 % nasal spray Place 2 sprays into both nostrils 2 (two) times daily as needed for rhinitis. PRN-NASAL DRAINAGE  . irbesartan (AVAPRO) 300 MG tablet Take 300 mg by mouth daily. HOLD for B/P <44/81, If systolic B/P is over 856, recheck B/P, document in Vital Signs.  Marland Kitchen levothyroxine (SYNTHROID, LEVOTHROID) 150 MCG tablet Take 150 mcg by mouth daily before breakfast. (0730)  . Lidocaine (ASPERCREME LIDOCAINE) 4 % PTCH Apply 3 patches topically once. Apply 1 patch to the left hip, left knee, and the right shoulder in the morning. Remove in the evening.  . loratadine (CLARITIN) 10 MG tablet Take 1 tablet (10 mg total) by mouth daily.  . Menthol, Topical Analgesic, (BIOFREEZE) 4 % GEL  Apply 1 application topically 3 (three) times daily as needed. Knee and hand pain  . mirtazapine (REMERON) 30 MG tablet Take 30 mg by mouth at bedtime.  . ondansetron (ZOFRAN) 4 MG tablet Take 4 mg by mouth every 8 (eight) hours as needed for nausea or vomiting.  . Oxycodone HCl 10 MG TABS Take 10 mg by mouth every 8 (eight) hours as needed.  Marland Kitchen oxyCODONE-acetaminophen (PERCOCET) 10-325 MG tablet Take one tablet by mouth four times daily for pain  . pantoprazole (PROTONIX) 40 MG tablet Take 40 mg by mouth daily.  . polyethylene glycol (MIRALAX / GLYCOLAX) packet Take 17 g by mouth daily.  . promethazine (PHENERGAN) 25 MG suppository Place 25 mg rectally every 6 (six) hours as needed for nausea or vomiting. nausea WITH vomiting x 24 hours. Clear liquid diet x24 hours. If NOT effective, notify MD.  . psyllium (METAMUCIL) 58.6 % powder Take 1 packet by mouth daily. Mix in 6 oz of water or juice  . sennosides-docusate sodium (SENOKOT-S) 8.6-50 MG tablet Take 1 tablet by mouth daily. Hold for loose stool  . sodium chloride 1 g tablet Take 1 g by mouth daily.  Marland Kitchen  UNABLE TO FIND Med Name: Hydrocolloid dressing 4x4. Change dressing to the coccyx as needed. Check patency daily  . zinc oxide 20 % ointment Apply 1 application topically 3 (three) times daily as needed for irritation. Apply to buttocks for redness/dark pink/excoriation   No facility-administered encounter medications on file as of 09/26/2018.     Review of Systems  Review of Systems  Constitutional: Negative for activity change, appetite change, chills, diaphoresis, fatigue and fever.  HENT: Negative for mouth sores, postnasal drip, rhinorrhea, sinus pain and sore throat.   Respiratory: Negative for apnea, cough, chest tightness, shortness of breath and wheezing.   Cardiovascular: Negative for chest pain, palpitations and leg swelling.  Gastrointestinal: Negative for abdominal distention, abdominal pain, constipation, diarrhea, nausea and vomiting.  Genitourinary: Negative for dysuria and frequency.  Musculoskeletal: Positive  for arthralgias, Skin: Negative for rash.  Neurological: Negative for dizziness, syncope, weakness, light-headedness and numbness.  Psychiatric/Behavioral: Negative for behavioral problems, confusion and sleep disturbance.     Immunization History  Administered Date(s) Administered  . Influenza Split 02/13/2013  . Influenza-Unspecified 02/27/2014, 02/12/2015, 02/25/2016, 03/08/2017, 02/19/2018  . PPD Test 09/09/2015  . Pneumococcal Conjugate-13 05/05/2016  . Pneumococcal Polysaccharide-23 02/13/2013  . Td 05/05/2016  . Zoster 05/04/2016   Pertinent  Health Maintenance Due  Topic Date Due  . MAMMOGRAM  03/21/1945  . INFLUENZA VACCINE  12/15/2018  . DEXA SCAN  Completed  . PNA vac Low Risk Adult  Completed   Fall Risk  01/08/2018 01/02/2017 11/10/2015 10/20/2015 10/20/2015  Falls in the past year? No No No Yes No  Number falls in past yr: - - - 2 or more -  Injury with Fall? - - - Yes -  Risk Factor Category  - - - - -  Risk for fall due to : - - - History of  fall(s);Impaired balance/gait -  Follow up - - - - -   Functional Status Survey:    Vitals:   09/26/18 1014  BP: (!) 165/83  Pulse: 98  Resp: 14  Temp: 97.6 F (36.4 C)  SpO2: 94%  Weight: 115 lb 6.4 oz (52.3 kg)  Height: 5\' 8"  (1.727 m)   Body mass index is 17.55 kg/m. Physical Exam  Constitutional: Oriented to person, place, and time. Well-developed and well-nourished.  HENT:  Head: Normocephalic.  Mouth/Throat: Oropharynx is clear and moist.  Eyes: Pupils are equal, round, and reactive to light.  Neck: Neck supple.  Cardiovascular: Normal rate and normal heart sounds.  No murmur heard. Pulmonary/Chest: Effort normal and breath sounds normal. No respiratory distress. No wheezes. She has no rales.  Abdominal: Soft. Bowel sounds are normal. No distension. There is no tenderness. There is no rebound.  Musculoskeletal: Has moderate  edema. Bilateral Lymphadenopathy: none Neurological: Alert and oriented to person, place, and time.  Skin: Skin is warm and dry.  Psychiatric: Normal mood and affect. Behavior is normal. Thought content normal.   Labs reviewed: Recent Labs    11/02/17 12/07/17 01/04/18  07/12/18  07/19/18 09/13/18 09/17/18  NA 130* 130* 131   < > 127*   < > 127* 130* 130*  K 4.0 3.9 3.8   < > 4.3   < > 4.3 3.5 3.8  CL 96 95  --   --  94  --   --   --   --   CO2 27 28  --   --  24  --   --   --   --   BUN 23* 24* 31*   < > 25*   < > 27* 17 16  CREATININE 1.1 1.0 1.12   < > 1.1   < > 1.1 0.9 0.9  CALCIUM 9.1 8.9 8.8  --  9.3  --   --   --   --    < > = values in this interval not displayed.   Recent Labs    10/19/17 01/30/18  AST 28 18  ALT 16 12  ALKPHOS 76 53  BILITOT 0.8  --   PROT 6.7  --   ALBUMIN 3.8  --    Recent Labs    10/24/17 01/04/18 07/16/18  WBC 5.2 4.7 5.0  HGB 11.8* 11.8 11.8*  HCT 33* 34.6 34*  MCV  --  98.6  --   PLT 173  --  194   Lab Results  Component Value Date   TSH 2.73 07/16/2018   No results found for: HGBA1C Lab  Results  Component Value Date   CHOL 175 11/10/2014   HDL 53 11/10/2014   LDLCALC 87 11/10/2014   TRIG 173 (A) 11/10/2014    Significant Diagnostic Results in last 30 days:  No results found.  Assessment/Plan  Hyponatremia Her sodium had  dropped to 123 on HCTZ Her repeat sodium is now stable  On Sodium tabs HCTZ was discontinued Repeat BMP in 4 weeks  Essential hypertension Sheis on a number of medications including hydralazine, clonidine, atenolol, Avapro Still having Bradycardia and Nurses holding her Atenolol So will decrease the dose ot 25 mg BID and eventually take her off Atenolol due to her Bradycardai  Hypothyroidism, unspecified type  TSH2.7 in 02/20  Primary osteoarthritis involving multiple joints On high-dose of Tylenol and Percocet Also now on Oxycodone 10 mg Q8 Prn Xrays of Knee showed Arthritis Pain Seems to be controlled on Duragesic Patch  History of pulmonary embolism Finished her course of Eliquis and was taken off few months ago Patient continues to be stable  Depression with anxiety on Remeron No SSRi due to Hyponatremia ACP Have D/W the Son . Patient wants to be comfort care MOST Form signed  Family/ staff Communication:   Labs/tests ordered:  BMP in 4 weeks  Total time spent in this patient care encounter was  40_  minutes; greater  than 50% of the visit spent counseling patient and staff, reviewing records , Labs and coordinating care for problems addressed at this encounter.

## 2018-09-28 ENCOUNTER — Non-Acute Institutional Stay: Payer: Medicare Other | Admitting: Internal Medicine

## 2018-09-28 ENCOUNTER — Encounter: Payer: Self-pay | Admitting: Internal Medicine

## 2018-09-28 DIAGNOSIS — R600 Localized edema: Secondary | ICD-10-CM | POA: Diagnosis not present

## 2018-09-28 DIAGNOSIS — I10 Essential (primary) hypertension: Secondary | ICD-10-CM

## 2018-09-28 DIAGNOSIS — M1990 Unspecified osteoarthritis, unspecified site: Secondary | ICD-10-CM | POA: Diagnosis not present

## 2018-09-28 DIAGNOSIS — E871 Hypo-osmolality and hyponatremia: Secondary | ICD-10-CM | POA: Diagnosis not present

## 2018-09-28 NOTE — Progress Notes (Signed)
Location:  [place] Room Number: 35/A Place of Service:  ALF [phone]) Provider:Jacklin Zwick L,MD   Virgie Dad, MD  Patient Care Team: Virgie Dad, MD as PCP - General (Internal Medicine) Mast, Man X, NP as Nurse Practitioner (Internal Medicine)  Extended Emergency Contact Information Primary Emergency Contact: Mentel,[name] Address: [address] [place], [place] [address] [place] Mobile Phone: [phone] Relation: Son  Code Status:DNR Goals of care: Advanced Directive information Advanced Directives 09/28/2018  Does Patient Have a Medical Advance Directive? Yes  Type of Paramedic of [place];Out of facility DNR (pink MOST or yellow form);Living will  Does patient want to make changes to medical advance directive? No - Patient declined  Copy of [place] in Chart? Yes - validated most recent copy scanned in chart (See row information)  Would patient like information on creating a medical advance directive? -  Pre-existing out of facility DNR order (yellow form or pink MOST form) Yellow form placed in chart (order not valid for inpatient use);Pink MOST form placed in chart (order not valid for inpatient use)     Chief Complaint  Patient presents with  . Acute Visit    follow up with blood pressure     HPI:  Pt is a 85 y.o. female seen today for an acute visit for Nurse had few Documented BP of 80/50 checked manually twice. And patient was also feeling Weak.  Patient is resident of AL in [facility] She has a history of uncontrolled hypertension, severe arthritis depression with anxiety.H/o PE in 2016,Hyponatremia, Chronic LE Edema, GERD,CKD Stage 3, Chronic Pain Syndrome  WE have been Adjusting her Meds to control her BP which sometimes SBP run more then 180 . She can not toleraete Diuretics due to hyponatremia Ut there were 2-3 BP recorded by [facility] where her BP has now been  too low. Som as low as 80/50 Checked Manually and she was c/o Feeling Weak She also has LE edema and refusses to wear Ted hoses No Othe rchanges recently No Fever, Dysuria, Cough  Or SOB Paitne tdid nto have any acute complains today    Past Medical History:  Diagnosis Date  . Acquired cyst of kidney    left  . Allergic rhinitis, cause unspecified   . Anemia, unspecified   . Chronic hyponatremia 09/05/2015  . CKD stage 2 due to type 2 diabetes mellitus ([place]) 11/18/2014  . Closed fracture of unspecified part of vertebral column without mention of spinal cord injury    T7 s/p kyphoplasty, T12  . Edema 2015  . Herpes simplex without mention of complication   . History of Epstein-Barr virus infection   . Hyposmolality and/or hyponatremia    07/13/15 Na 131  . Insomnia   . Insomnia, unspecified   . Left cavernous carotid aneurysm 08/2008   1-1/107mm aneurysm of cavernous carotid artery  . Malignant neoplasm of breast (female), unspecified site 1990   left. Had surgerey and chemo, but no radiation  . 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 Neuropathy   . Osteoarthrosis of knee    bilaterally  . Osteoarthrosis, hip   . Osteoarthrosis, unspecified whether generalized or localized, forearm    bilaterally wrist  . Other and unspecified nonspecific immunological findings    positive ANA  . Other B-complex deficiencies   . Pain  in joint, site unspecified    severe, diffuse, chronic pain  . Positive ANA (antinuclear antibody)   . Pulmonary embolism ([place]) 05/07/2015  . Pure hypercholesterolemia   . Senile osteoporosis   . Unspecified essential hypertension   . Unspecified gastritis and gastroduodenitis without mention of hemorrhage   . Unspecified hypothyroidism   . Unspecified vitamin D deficiency    Past Surgical History:  Procedure Laterality Date  . BREAST SURGERY Left   . DILATATION & CURETTAGE/HYSTEROSCOPY WITH MYOSURE N/A 06/27/2017    Procedure: DILATATION & CURETTAGE/HYSTEROSCOPY WITH MYOSURE;  Surgeon: Servando Salina, MD;  Location: [facility];  Service: Gynecology;  Laterality: N/A;  . DILATION AND CURETTAGE OF UTERUS  X 2  . FEMUR IM NAIL Right 03/18/2015   Procedure: INTRAMEDULLARY (IM) NAIL FEMORAL;  Surgeon: Rod Can, MD;  Location: [facility];  Service: Orthopedics;  Laterality: Right;  . FRACTURE SURGERY     hip  . KYPHOPLASTY  07/03/2010   T12  . KYPHOPLASTY     C7  . LAPAROSCOPIC CHOLECYSTECTOMY  09/20/2006  . MASTECTOMY Left 04/29/1989  . MIDDLE EAR SURGERY Bilateral 1938  . NASAL SINUS SURGERY  1990 & 2000  . ORIF HIP FRACTURE Left 06/13/2007   following a syncopal episode  . TONSILLECTOMY  1968    Allergies  Allergen Reactions  . Aspirin     Drop in body temp with large quantities, can tolerate low doses of aspirin  . Hctz [Hydrochlorothiazide] Other (See Comments)    Hyponatremia  . Penicillins Swelling    Has patient had a PCN reaction causing immediate rash, facial/tongue/throat swelling, SOB or lightheadedness with hypotension: No Has patient had a PCN reaction causing severe rash involving mucus membranes or skin necrosis: No Has patient had a PCN reaction that required hospitalization No Has patient had a PCN reaction occurring within the last 10 years: No If all of the above answers are "NO", then may proceed with Cephalosporin use.    Outpatient Encounter Medications as of 09/28/2018  Medication Sig  . acetaminophen (TYLENOL) 500 MG tablet Take 1,000 mg by mouth daily.   Marland Kitchen acetaminophen (TYLENOL) 500 MG tablet Take 500 mg by mouth every morning.   Marland Kitchen amLODipine (NORVASC) 10 MG tablet Take 10 mg by mouth daily. Hold if HR <55  . atenolol (TENORMIN) 25 MG tablet Take 25 mg by mouth 2 (two) times daily.  . cloNIDine (CATAPRES) 0.1 MG tablet Take 0.1 mg by mouth 2 (two) times daily as needed. Take in the morning and at bedtime.  . famotidine (PEPCID) 10 MG tablet Take 10 mg by mouth at  bedtime.  . feeding supplement (BOOST / RESOURCE BREEZE) LIQD Take 1 Container by mouth 2 (two) times daily between meals. (1000 & 1600) Prefers Chocolate  . fentaNYL (DURAGESIC) 12 MCG/HR Place 1 patch onto the skin every 3 (three) days for 30 days.  . hydrALAZINE (APRESOLINE) 100 MG tablet Take 125 mg by mouth 3 (three) times daily. If SBP >160 recheck B/P and document in vital sign record.   Marland Kitchen ipratropium (ATROVENT) 0.06 % nasal spray Place 2 sprays into both nostrils 2 (two) times daily as needed for rhinitis. PRN-NASAL DRAINAGE  . irbesartan (AVAPRO) 300 MG tablet Take 300 mg by mouth daily. HOLD for B/P <99/24, If systolic B/P is over 268, recheck B/P, document in Vital Signs.  Marland Kitchen levothyroxine (SYNTHROID, LEVOTHROID) 150 MCG tablet Take 150 mcg by mouth daily before breakfast. (0730)  . Lidocaine (ASPERCREME LIDOCAINE) 4 % PTCH Apply  3 patches topically once. Apply 1 patch to the left hip, left knee, and the right shoulder in the morning. Remove in the evening.  . loratadine (CLARITIN) 10 MG tablet Take 1 tablet (10 mg total) by mouth daily.  . Menthol, Topical Analgesic, (BIOFREEZE) 4 % GEL Apply 1 application topically 3 (three) times daily as needed. Knee and hand pain  . mirtazapine (REMERON) 30 MG tablet Take 30 mg by mouth at bedtime.  . ondansetron (ZOFRAN) 4 MG tablet Take 4 mg by mouth every 8 (eight) hours as needed for nausea or vomiting.  . Oxycodone HCl 10 MG TABS Take 10 mg by mouth every 8 (eight) hours as needed.  Marland Kitchen oxyCODONE-acetaminophen (PERCOCET) 10-325 MG tablet Take one tablet by mouth four times daily for pain  . pantoprazole (PROTONIX) 40 MG tablet Take 40 mg by mouth daily.  . polyethylene glycol (MIRALAX / GLYCOLAX) packet Take 17 g by mouth daily.  . promethazine (PHENERGAN) 25 MG suppository Place 25 mg rectally every 6 (six) hours as needed for nausea or vomiting. nausea WITH vomiting x 24 hours. Clear liquid diet x24 hours. If NOT effective, notify MD.  . psyllium  (METAMUCIL) 58.6 % powder Take 1 packet by mouth daily. Mix in 6 oz of water or juice  . sennosides-docusate sodium (SENOKOT-S) 8.6-50 MG tablet Take 1 tablet by mouth daily. Hold for loose stool  . sodium chloride 1 g tablet Take 1 g by mouth daily.  Marland Kitchen UNABLE TO FIND Med Name: Hydrocolloid dressing 4x4. Change dressing to the coccyx as needed. Check patency daily  . zinc oxide 20 % ointment Apply 1 application topically 3 (three) times daily as needed for irritation. Apply to buttocks for redness/dark pink/excoriation  . [DISCONTINUED] atenolol (TENORMIN) 50 MG tablet Take 25 mg by mouth daily. Hold atenolol if HR less than 55 bpm.   No facility-administered encounter medications on file as of 09/28/2018.     Review of Systems  Review of Systems  Constitutional: Negative for activity change, appetite change, chills, diaphoresis, fatigue and fever.  HENT: Negative for mouth sores, postnasal drip, rhinorrhea, sinus pain and sore throat.   Respiratory: Negative for apnea, cough, chest tightness, shortness of breath and wheezing.   Cardiovascular: Negative for chest pain, palpitations and leg swelling.  Gastrointestinal: Negative for abdominal distention, abdominal pain, constipation, diarrhea, nausea and vomiting.  Genitourinary: Negative for dysuria and frequency.  Musculoskeletal: Negative for arthralgias, joint swelling and myalgias.  Skin: Negative for rash.  Neurological: Negative for dizziness, syncope, weakness, light-headedness and numbness.  Psychiatric/Behavioral: Negative for behavioral problems, confusion and sleep disturbance.     Immunization History  Administered Date(s) Administered  . Influenza Split 02/13/2013  . Influenza-Unspecified 02/27/2014, 02/12/2015, 02/25/2016, 03/08/2017, 02/19/2018  . PPD Test 09/09/2015  . Pneumococcal Conjugate-13 05/05/2016  . Pneumococcal Polysaccharide-23 02/13/2013  . Td 05/05/2016  . Zoster 05/04/2016   Pertinent  Health Maintenance  Due  Topic Date Due  . MAMMOGRAM  03/21/1945  . INFLUENZA VACCINE  12/15/2018  . DEXA SCAN  Completed  . PNA vac Low Risk Adult  Completed   Fall Risk  01/08/2018 01/02/2017 11/10/2015 10/20/2015 10/20/2015  Falls in the past year? No No No Yes No  Number falls in past yr: - - - 2 or more -  Injury with Fall? - - - Yes -  Risk Factor Category  - - - - -  Risk for fall due to : - - - History of fall(s);Impaired balance/gait -  Follow  up - - - - -   Functional Status Survey:    Vitals:   09/28/18 1025  BP: 139/66  Pulse: 62  Resp: 16  Temp: (!) 97.1 F (36.2 C)  SpO2: 98%  Weight: 115 lb 6.4 oz (52.3 kg)  Height: 5\' 8"  (1.727 m)   Body mass index is 17.55 kg/m. Physical Exam  Constitutional: Oriented to person, place, and time. Well-developed and well-nourished.  HENT:  Head: Normocephalic.  Mouth/Throat: Oropharynx is clear and moist.  Eyes: Pupils are equal, round, and reactive to light.  Neck: Neck supple.  Cardiovascular: Normal rate and normal heart sounds.  No murmur heard. Pulmonary/Chest: Effort normal and breath sounds normal. No respiratory distress. No wheezes. She has no rales.  Abdominal: Soft. Bowel sounds are normal. No distension. There is no tenderness. There is no rebound.  Musculoskeletal: Moderate Edema Bilateral Lymphadenopathy: none Neurological: Alert and oriented to person, place, and time.  Skin: Skin is warm and dry.  Psychiatric: Normal mood and affect. Behavior is normal. Thought content normal.    Labs reviewed: Recent Labs    11/02/17 12/07/17 01/04/18  07/12/18  07/19/18 09/13/18 09/17/18  NA 130* 130* 131   < > 127*   < > 127* 130* 130*  K 4.0 3.9 3.8   < > 4.3   < > 4.3 3.5 3.8  CL 96 95  --   --  94  --   --   --   --   CO2 27 28  --   --  24  --   --   --   --   BUN 23* 24* 31*   < > 25*   < > 27* 17 16  CREATININE 1.1 1.0 1.12   < > 1.1   < > 1.1 0.9 0.9  CALCIUM 9.1 8.9 8.8  --  9.3  --   --   --   --    < > = values in this  interval not displayed.   Recent Labs    10/19/17 01/30/18  AST 28 18  ALT 16 12  ALKPHOS 76 53  BILITOT 0.8  --   PROT 6.7  --   ALBUMIN 3.8  --    Recent Labs    10/24/17 01/04/18 07/16/18  WBC 5.2 4.7 5.0  HGB 11.8* 11.8 11.8*  HCT 33* 34.6 34*  MCV  --  98.6  --   PLT 173  --  194   Lab Results  Component Value Date   TSH 2.73 07/16/2018   No results found for: HGBA1C Lab Results  Component Value Date   CHOL 175 11/10/2014   HDL 53 11/10/2014   LDLCALC 87 11/10/2014   TRIG 173 (A) 11/10/2014    Significant Diagnostic Results in last 30 days:  No results found.  Assessment/Plan Essential hypertension We have to back out on her some meds due to Low BP Will discontinue Atenolol due to Bradycardia Decrease her Norvasc to 5 mg due to edema Will continue Hydralazine and clonidine and Avapro  Chronic Hyponatremia Stable on Sodium Tabs Repeat BMP in 4 weeks  Bilateral leg edema Refuses Ted hoses and cannot use Diuretics Will decrease the Dose of Norvasc  Generalized arthritis Continue  oxycodone and Duragesic patch  Hypothyroidism, unspecified type  TSH2.7 in 02/20   History of pulmonary embolism Finished her course of Eliquis and was taken off few months ago Patient continues to be stable  Depression with anxiety on Remeron  No SSRi due to Hyponatremia ACP Have D/W the Son . Patient wants to be comfort care MOST Form signed    Family/ staff Communication:   Labs/tests ordered:

## 2018-09-29 DIAGNOSIS — R600 Localized edema: Secondary | ICD-10-CM | POA: Insufficient documentation

## 2018-10-02 NOTE — Addendum Note (Signed)
Addended by: Georgina Snell on: 10/02/2018 07:40 AM   Modules accepted: Level of Service

## 2018-10-02 NOTE — Addendum Note (Signed)
Addended by: Georgina Snell on: 10/02/2018 07:39 AM   Modules accepted: Level of Service

## 2018-10-12 ENCOUNTER — Other Ambulatory Visit: Payer: Self-pay

## 2018-10-12 MED ORDER — OXYCODONE-ACETAMINOPHEN 10-325 MG PO TABS: ORAL_TABLET | ORAL | 0 refills | Status: DC

## 2018-10-12 NOTE — Telephone Encounter (Signed)
Last refill 4/30  Routed to Dr Lyndel Safe for refill approval

## 2018-10-18 DIAGNOSIS — I10 Essential (primary) hypertension: Secondary | ICD-10-CM | POA: Diagnosis not present

## 2018-10-19 ENCOUNTER — Other Ambulatory Visit: Payer: Self-pay | Admitting: *Deleted

## 2018-10-19 MED ORDER — FENTANYL 12 MCG/HR TD PT72: 1.0000 | MEDICATED_PATCH | TRANSDERMAL | 0 refills | Status: DC

## 2018-10-19 NOTE — Telephone Encounter (Signed)
Received fax refill Request from [facility] °Pended Rx and sent to Dr. Gupta for approval.  °

## 2018-11-09 ENCOUNTER — Other Ambulatory Visit: Payer: Self-pay | Admitting: *Deleted

## 2018-11-09 MED ORDER — OXYCODONE-ACETAMINOPHEN 10-325 MG PO TABS: ORAL_TABLET | ORAL | 0 refills | Status: DC

## 2018-11-09 NOTE — Telephone Encounter (Signed)
Received order from [facility] and sent to Dr. Lyndel Safe for approval.

## 2018-11-10 DIAGNOSIS — Z03818 Encounter for observation for suspected exposure to other biological agents ruled out: Secondary | ICD-10-CM | POA: Diagnosis not present

## 2018-11-12 ENCOUNTER — Other Ambulatory Visit: Payer: Self-pay | Admitting: *Deleted

## 2018-11-12 LAB — NOVEL CORONAVIRUS, NAA: SARS-CoV-2, NAA: NOT DETECTED

## 2018-11-12 MED ORDER — FENTANYL 12 MCG/HR TD PT72: 1.0000 | MEDICATED_PATCH | TRANSDERMAL | 0 refills | Status: AC

## 2018-11-12 NOTE — Telephone Encounter (Signed)
Received fax order from [facility] for pain patch. Pended Rx and sent to Dr. Lyndel Safe for approval.

## 2018-11-19 DIAGNOSIS — I10 Essential (primary) hypertension: Secondary | ICD-10-CM | POA: Diagnosis not present

## 2018-12-04 DIAGNOSIS — F0390 Unspecified dementia without behavioral disturbance: Secondary | ICD-10-CM | POA: Diagnosis not present

## 2018-12-04 LAB — BASIC METABOLIC PANEL
BUN: 14 (ref 4–21)
Creatinine: 0.9 (ref 0.5–1.1)
Glucose: 83
Potassium: 3.7 (ref 3.4–5.3)
Sodium: 131 — AB (ref 137–147)

## 2018-12-04 LAB — HEPATIC FUNCTION PANEL
ALT: 7 (ref 7–35)
AST: 16 (ref 13–35)

## 2018-12-14 ENCOUNTER — Other Ambulatory Visit: Payer: Self-pay

## 2018-12-14 MED ORDER — OXYCODONE-ACETAMINOPHEN 10-325 MG PO TABS: ORAL_TABLET | ORAL | 0 refills | Status: DC

## 2018-12-14 MED ORDER — FENTANYL 12 MCG/HR TD PT72: 1.0000 | MEDICATED_PATCH | TRANSDERMAL | 0 refills | Status: DC

## 2018-12-14 NOTE — Telephone Encounter (Signed)
Fentanyl patch 12.42mcg 1 every 72 hours added to med list per Tammy nurse at [facility]

## 2018-12-14 NOTE — Telephone Encounter (Signed)
Fax sent back to [facility] indicating rx approved electronically

## 2018-12-14 NOTE — Telephone Encounter (Signed)
Received a request for refill on this medication patch Duragesic 12 mcg Patches but not on patients med list please advise

## 2018-12-14 NOTE — Telephone Encounter (Signed)
Incoming fax received from [facility] requesting refill for oxycodone-acetaminophen 10-325. Patient has no pills on hand. Last filled in [place] on 11/09/2018  RX requested routed to [place], NP to review and approve if necessary

## 2018-12-20 ENCOUNTER — Non-Acute Institutional Stay: Payer: Medicare Other | Admitting: Internal Medicine

## 2018-12-20 ENCOUNTER — Encounter: Payer: Self-pay | Admitting: Internal Medicine

## 2018-12-20 DIAGNOSIS — N183 Chronic kidney disease, stage 3 unspecified: Secondary | ICD-10-CM

## 2018-12-20 DIAGNOSIS — E039 Hypothyroidism, unspecified: Secondary | ICD-10-CM | POA: Diagnosis not present

## 2018-12-20 DIAGNOSIS — F418 Other specified anxiety disorders: Secondary | ICD-10-CM

## 2018-12-20 DIAGNOSIS — G894 Chronic pain syndrome: Secondary | ICD-10-CM

## 2018-12-20 DIAGNOSIS — D638 Anemia in other chronic diseases classified elsewhere: Secondary | ICD-10-CM | POA: Diagnosis not present

## 2018-12-20 DIAGNOSIS — M1990 Unspecified osteoarthritis, unspecified site: Secondary | ICD-10-CM

## 2018-12-20 DIAGNOSIS — E871 Hypo-osmolality and hyponatremia: Secondary | ICD-10-CM | POA: Diagnosis not present

## 2018-12-20 DIAGNOSIS — M810 Age-related osteoporosis without current pathological fracture: Secondary | ICD-10-CM

## 2018-12-20 DIAGNOSIS — I10 Essential (primary) hypertension: Secondary | ICD-10-CM

## 2018-12-20 LAB — BASIC METABOLIC PANEL WITH GFR
BUN: 18 (ref 4–21)
Creatinine: 1 (ref 0.5–1.1)
Glucose: 89
Potassium: 3.7 (ref 3.4–5.3)
Sodium: 131 — AB (ref 137–147)

## 2018-12-20 NOTE — Progress Notes (Signed)
Location:  [place] Room Number: 35 Place of Service:  ALF [id number]) Provider:Patty Leitzke L,MD   Virgie Dad, MD  Patient Care Team: Virgie Dad, MD as PCP - General (Internal Medicine) Mast, Man X, NP as Nurse Practitioner (Internal Medicine)  Extended Emergency Contact Information Primary Emergency Contact: Vanalstine,[name] Address: [address] [place], [place] [address] [place] Mobile Phone: [phone] Relation: Son  Code Status:DNR Goals of care: Advanced Directive information Advanced Directives 12/20/2018  Does Patient Have a Medical Advance Directive? Yes  Type of Advance Directive Living will;Healthcare Power of [place];Out of facility DNR (pink MOST or yellow form)  Does patient want to make changes to medical advance directive? No - Patient declined  Copy of [place] in Chart? Yes - validated most recent copy scanned in chart (See row information)  Would patient like information on creating a medical advance directive? -  Pre-existing out of facility DNR order (yellow form or pink MOST form) Yellow form placed in chart (order not valid for inpatient use);Pink MOST form placed in chart (order not valid for inpatient use)     Chief Complaint  Patient presents with  . Medical Management of Chronic Issues    Routine visit   . Health Maintenance    mammogram, influenza vaccine     HPI:  Pt is a 85 y.o. female seen today for medical management of chronic diseases.   Patient is resident of [facility] in [facility] She has a history of uncontrolled hypertension, severe arthritis depression with anxiety.H/o PE in 2016,Hyponatremia, Chronic LE Edema, GERD,CKD Stage 3, Chronic Pain Syndrome  Patient has a history of hypertension has been on a number of antihypertensive medications.  But recently her blood pressure has been running low especially in afternoon and at night.  She still has high blood pressure in  the morning.  She was taken off the Lopressor due to bradycardia.  The dose of Norvasc was changed from 10 to 65 mg.  She also had decreased her hydralazine during morning and afternoon dose trying to continue the same dose at night due to blood pressures in the morning Patient cannot tolerate diuretics due to severe hyponatremia I will also trying to see if I can taper her off clonidine to reduce the pill burden  Patient continues to complain of pain in both her knees.  She is refused to see Ortho knee replacement.  X-rays have shown severe arthritis She is on fentanyl and oxycodone  Her mood is staying stable.  Though her appetite is not good and she has lost 8 pounds in past Few months.  She states she does not like the food here Patient continues to walk with her walker but mostly likes to stay. Past Medical History:  Diagnosis Date  . Acquired cyst of kidney    left  . Allergic rhinitis, cause unspecified   . Anemia, unspecified   . Chronic hyponatremia 09/05/2015  . CKD stage 2 due to type 2 diabetes mellitus ([place]) 11/18/2014  . Closed fracture of unspecified part of vertebral column without mention of spinal cord injury    T7 s/p kyphoplasty, T12  . Edema 2015  . Herpes simplex without mention of complication   . History of Epstein-Barr virus infection   . Hyposmolality and/or hyponatremia    07/13/15 Na 131  . Insomnia   . Insomnia, unspecified   . Left cavernous  carotid aneurysm 08/2008   1-1/74mm aneurysm of cavernous carotid artery  . Malignant neoplasm of breast (female), unspecified site 1990   left. Had surgerey and chemo, but no radiation  . 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 Neuropathy   . Osteoarthrosis of knee    bilaterally  . Osteoarthrosis, hip   . Osteoarthrosis, unspecified whether generalized or localized, forearm    bilaterally wrist  . Other and unspecified nonspecific immunological findings    positive ANA   . Other B-complex deficiencies   . Pain in joint, site unspecified    severe, diffuse, chronic pain  . Positive ANA (antinuclear antibody)   . Pulmonary embolism ([place]) 05/07/2015  . Pure hypercholesterolemia   . Senile osteoporosis   . Unspecified essential hypertension   . Unspecified gastritis and gastroduodenitis without mention of hemorrhage   . Unspecified hypothyroidism   . Unspecified vitamin D deficiency    Past Surgical History:  Procedure Laterality Date  . BREAST SURGERY Left   . DILATATION & CURETTAGE/HYSTEROSCOPY WITH MYOSURE N/A 06/27/2017   Procedure: DILATATION & CURETTAGE/HYSTEROSCOPY WITH MYOSURE;  Surgeon: Servando Salina, MD;  Location: [facility];  Service: Gynecology;  Laterality: N/A;  . DILATION AND CURETTAGE OF UTERUS  X 2  . FEMUR IM NAIL Right 03/18/2015   Procedure: INTRAMEDULLARY (IM) NAIL FEMORAL;  Surgeon: Rod Can, MD;  Location: [facility];  Service: Orthopedics;  Laterality: Right;  . FRACTURE SURGERY     hip  . KYPHOPLASTY  07/03/2010   T12  . KYPHOPLASTY     C7  . LAPAROSCOPIC CHOLECYSTECTOMY  09/20/2006  . MASTECTOMY Left 04/29/1989  . MIDDLE EAR SURGERY Bilateral 1938  . NASAL SINUS SURGERY  1990 & 2000  . ORIF HIP FRACTURE Left 06/13/2007   following a syncopal episode  . TONSILLECTOMY  1968    Allergies  Allergen Reactions  . Aspirin     Drop in body temp with large quantities, can tolerate low doses of aspirin  . Hctz [Hydrochlorothiazide] Other (See Comments)    Hyponatremia  . Penicillins Swelling    Has patient had a PCN reaction causing immediate rash, facial/tongue/throat swelling, SOB or lightheadedness with hypotension: No Has patient had a PCN reaction causing severe rash involving mucus membranes or skin necrosis: No Has patient had a PCN reaction that required hospitalization No Has patient had a PCN reaction occurring within the last 10 years: No If all of the above answers are "NO", then may proceed with Cephalosporin  use.    Outpatient Encounter Medications as of 12/20/2018  Medication Sig  . acetaminophen (TYLENOL) 500 MG tablet Take 1,000 mg by mouth daily.   Marland Kitchen acetaminophen (TYLENOL) 500 MG tablet Take 500 mg by mouth every morning.   Marland Kitchen amLODipine (NORVASC) 5 MG tablet Take 5 mg by mouth daily.  . cloNIDine (CATAPRES) 0.1 MG tablet Take 0.1 mg by mouth 2 (two) times daily as needed. Take in the morning and at bedtime.  . famotidine (PEPCID) 10 MG tablet Take 10 mg by mouth at bedtime.  . feeding supplement (BOOST / RESOURCE BREEZE) LIQD Take 1 Container by mouth 2 (two) times daily between meals. (1000 & 1600) Prefers Chocolate  . fentaNYL (DURAGESIC) 12 MCG/HR Place 1 patch onto the skin every 3 (three) days.  . hydrALAZINE (APRESOLINE) 100 MG tablet Take 100 mg by mouth daily. If SBP >160 recheck B/P and document in vital sign record.   . hydrALAZINE (APRESOLINE)  50 MG tablet Take 50 mg by mouth 2 (two) times daily.  Marland Kitchen ipratropium (ATROVENT) 0.06 % nasal spray Place 2 sprays into both nostrils 2 (two) times daily as needed for rhinitis. PRN-NASAL DRAINAGE  . irbesartan (AVAPRO) 300 MG tablet Take 300 mg by mouth daily. HOLD for B/P <62/56, If systolic B/P is over 389, recheck B/P, document in Vital Signs.  Marland Kitchen levothyroxine (SYNTHROID, LEVOTHROID) 150 MCG tablet Take 150 mcg by mouth daily before breakfast. (0730)  . Lidocaine (ASPERCREME LIDOCAINE) 4 % PTCH Apply 3 patches topically once. Apply 1 patch to the left hip, left knee, and the right shoulder in the morning. Remove in the evening. (Patient taking differently: Apply 3 patches topically 2 (two) times daily. Apply 1 patch to the left hip, left knee, and the right shoulder in the morning. Remove in the evening.)  . loratadine (CLARITIN) 10 MG tablet Take 1 tablet (10 mg total) by mouth daily.  . Menthol, Topical Analgesic, (BIOFREEZE) 4 % GEL Apply 1 application topically 3 (three) times daily as needed. Knee and hand pain  . mirtazapine (REMERON) 30  MG tablet Take 30 mg by mouth at bedtime.  . ondansetron (ZOFRAN) 4 MG tablet Take 4 mg by mouth every 8 (eight) hours as needed for nausea or vomiting.  . Oxycodone HCl 10 MG TABS Take 10 mg by mouth every 8 (eight) hours as needed.  Marland Kitchen oxyCODONE-acetaminophen (PERCOCET) 10-325 MG tablet Take one tablet by mouth four times daily 6 am, 12 am, 6 pm, and 12 pm for pain  . pantoprazole (PROTONIX) 40 MG tablet Take 40 mg by mouth daily.  . polyethylene glycol (MIRALAX / GLYCOLAX) packet Take 17 g by mouth daily.  . promethazine (PHENERGAN) 25 MG suppository Place 25 mg rectally every 6 (six) hours as needed for nausea or vomiting. nausea WITH vomiting x 24 hours. Clear liquid diet x24 hours. If NOT effective, notify MD.  . psyllium (METAMUCIL) 58.6 % powder Take 1 packet by mouth daily. Mix in 6 oz of water or juice  . sennosides-docusate sodium (SENOKOT-S) 8.6-50 MG tablet Take 1 tablet by mouth daily. Hold for loose stool  . sodium chloride 1 g tablet Take 1 g by mouth 2 (two) times daily.   Marland Kitchen UNABLE TO FIND Med Name: Hydrocolloid dressing 4x4. Change dressing to the coccyx as needed. Check patency daily  . zinc oxide 20 % ointment Apply 1 application topically 3 (three) times daily as needed for irritation. Apply to buttocks for redness/dark pink/excoriation   No facility-administered encounter medications on file as of 12/20/2018.     Review of Systems  Constitutional: Positive for appetite change.  HENT: Negative.   Respiratory: Negative.   Cardiovascular: Negative.   Gastrointestinal: Negative.   Genitourinary: Negative.   Musculoskeletal: Positive for arthralgias, back pain and myalgias.  Skin: Negative.   Neurological: Negative.   Psychiatric/Behavioral: Negative.   All other systems reviewed and are negative.   Immunization History  Administered Date(s) Administered  . Influenza Split 02/13/2013  . Influenza-Unspecified 02/27/2014, 02/12/2015, 02/25/2016, 03/08/2017, 02/19/2018  .  PPD Test 09/09/2015  . Pneumococcal Conjugate-13 05/05/2016  . Pneumococcal Polysaccharide-23 02/13/2013  . Td 05/05/2016  . Zoster 05/04/2016   Pertinent  Health Maintenance Due  Topic Date Due  . MAMMOGRAM  03/21/1945  . INFLUENZA VACCINE  12/15/2018  . DEXA SCAN  Completed  . PNA vac Low Risk Adult  Completed   Fall Risk  01/08/2018 01/02/2017 11/10/2015 10/20/2015 10/20/2015  Falls in the  past year? No No No Yes No  Number falls in past yr: - - - 2 or more -  Injury with Fall? - - - Yes -  Risk Factor Category  - - - - -  Risk for fall due to : - - - History of fall(s);Impaired balance/gait -  Follow up - - - - -   Functional Status Survey:    Vitals:   12/20/18 1258  BP: (!) 145/95  Pulse: 80  Resp: 20  Temp: 98 F (36.7 C)  SpO2: 97%  Weight: 108 lb (49 kg)  Height: 5\' 8"  (1.727 m)   Body mass index is 16.42 kg/m. Physical Exam Vitals signs reviewed.  HENT:     Head: Normocephalic.     Nose: Nose normal.     Mouth/Throat:     Mouth: Mucous membranes are moist.     Pharynx: Oropharynx is clear.  Eyes:     Pupils: Pupils are equal, round, and reactive to light.  Neck:     Musculoskeletal: Neck supple.  Cardiovascular:     Rate and Rhythm: Normal rate and regular rhythm.     Pulses: Normal pulses.     Heart sounds: Normal heart sounds.  Pulmonary:     Effort: Pulmonary effort is normal.     Breath sounds: Normal breath sounds.  Abdominal:     General: Abdomen is flat. Bowel sounds are normal.     Palpations: Abdomen is soft.  Musculoskeletal:     Comments: Mild swelling Bilateral in LE No Knee swelling or redness  Skin:    General: Skin is warm.  Neurological:     General: No focal deficit present.     Mental Status: She is alert and oriented to person, place, and time.  Psychiatric:        Mood and Affect: Mood normal.        Thought Content: Thought content normal.     Labs reviewed: Recent Labs    01/04/18  07/12/18  09/13/18 09/17/18 12/04/18   NA 131   < > 127*   < > 130* 130* 131*  K 3.8   < > 4.3   < > 3.5 3.8 3.7  CL  --   --  94  --   --   --   --   CO2  --   --  24  --   --   --   --   BUN 31*   < > 25*   < > 17 16 14   CREATININE 1.12   < > 1.1   < > 0.9 0.9 0.9  CALCIUM 8.8  --  9.3  --   --   --   --    < > = values in this interval not displayed.   Recent Labs    01/30/18 12/04/18  AST 18 16  ALT 12 7  ALKPHOS 53  --    Recent Labs    01/04/18 07/16/18  WBC 4.7 5.0  HGB 11.8 11.8*  HCT 34.6 34*  MCV 98.6  --   PLT  --  194   Lab Results  Component Value Date   TSH 2.73 07/16/2018   No results found for: HGBA1C Lab Results  Component Value Date   CHOL 175 11/10/2014   HDL 53 11/10/2014   LDLCALC 87 11/10/2014   TRIG 173 (A) 11/10/2014    Significant Diagnostic Results in last 30 days:  No results found.  Assessment/Plan Essential hypertension - Plan: Reduced Clonidine to 0.1 mg QD If BP stays stable will taper her off Continue on hydralazine and Avapro and Norvasc  Hypothyroidism, unspecified type - Plan:  TSH normal in 02/20 Generalized arthritis with chronic knee pain Continue on oxycodone She wants to try Rubbing Cream on her Legs Biofreeze BID  LE Edema Better on Reduced dose of Nirvasc Cannot use diuretics She does not like Ted hoses  CKD (chronic kidney disease) stage 3, GFR 30-59 ml/min (HCC) - Plan:  BUN and creatinine are stable  Chronic Hyponatremia - Plan:  BMP today showed sodium of 131 Continue sodium tablets  Chronic pain syndrome - Plan:  We will continue on oxycodone and Duragesic patch  Depression with anxiety - Plan:  On high-dose of Remeron  Anemia of chronic disease - Plan:  Hgb Stable  ACP Comfort care  Family/ staff Communication:   Labs/tests ordered:  BMP ,CBC,TSH in 4 weeks   Total time spent in this patient care encounter was  _ 40 minutes; greater than 50% of the visit spent counseling patient and staff, reviewing records , Labs and  coordinating care for problems addressed at this encounter.

## 2019-01-02 ENCOUNTER — Other Ambulatory Visit: Payer: Self-pay | Admitting: *Deleted

## 2019-01-02 MED ORDER — FENTANYL 12 MCG/HR TD PT72: 1.0000 | MEDICATED_PATCH | TRANSDERMAL | 0 refills | Status: DC

## 2019-01-02 NOTE — Telephone Encounter (Signed)
[facility] Refill Request Pended Rx and sent to Dr. Lyndel Safe for approval.

## 2019-01-11 ENCOUNTER — Other Ambulatory Visit: Payer: Self-pay

## 2019-01-11 MED ORDER — OXYCODONE-ACETAMINOPHEN 10-325 MG PO TABS: ORAL_TABLET | ORAL | 0 refills | Status: DC

## 2019-01-24 ENCOUNTER — Other Ambulatory Visit: Payer: Self-pay | Admitting: *Deleted

## 2019-01-24 DIAGNOSIS — I10 Essential (primary) hypertension: Secondary | ICD-10-CM | POA: Diagnosis not present

## 2019-01-24 DIAGNOSIS — E039 Hypothyroidism, unspecified: Secondary | ICD-10-CM | POA: Diagnosis not present

## 2019-01-24 DIAGNOSIS — D649 Anemia, unspecified: Secondary | ICD-10-CM | POA: Diagnosis not present

## 2019-01-24 MED ORDER — FENTANYL 12 MCG/HR TD PT72: 1.0000 | MEDICATED_PATCH | TRANSDERMAL | 0 refills | Status: DC

## 2019-01-24 NOTE — Telephone Encounter (Signed)
[facility] Refill Request Pended Rx and sent to [facility] for approval.

## 2019-02-08 ENCOUNTER — Other Ambulatory Visit: Payer: Self-pay | Admitting: *Deleted

## 2019-02-08 NOTE — Telephone Encounter (Signed)
Fax refill Request Pended Rx and sent to Dr. Lyndel Safe for approval.

## 2019-02-10 MED ORDER — OXYCODONE-ACETAMINOPHEN 10-325 MG PO TABS: ORAL_TABLET | ORAL | 0 refills | Status: DC

## 2019-02-21 DIAGNOSIS — I10 Essential (primary) hypertension: Secondary | ICD-10-CM | POA: Diagnosis not present

## 2019-02-27 ENCOUNTER — Other Ambulatory Visit: Payer: Self-pay | Admitting: *Deleted

## 2019-02-27 ENCOUNTER — Telehealth: Payer: Self-pay

## 2019-02-27 MED ORDER — FENTANYL 12 MCG/HR TD PT72: 1.0000 | MEDICATED_PATCH | TRANSDERMAL | 0 refills | Status: DC

## 2019-02-27 NOTE — Telephone Encounter (Signed)
Spoke with Dr. Lyndel Safe regarding this medication refill. She stated that she would fill the script by the end of the day. Faxed order back to the facility stating will be filled by Dr. Lyndel Safe.

## 2019-02-27 NOTE — Telephone Encounter (Signed)
Incoming fax received from [facility] Requesting Refill for Fentanyl 25 mcg/hr; amt 12.5 mcg/hr; transdermal once every morning for 3 days

## 2019-03-05 ENCOUNTER — Encounter: Payer: Self-pay | Admitting: Nurse Practitioner

## 2019-03-05 ENCOUNTER — Non-Acute Institutional Stay: Payer: Medicare Other | Admitting: Nurse Practitioner

## 2019-03-05 DIAGNOSIS — Z0000 Encounter for general adult medical examination without abnormal findings: Secondary | ICD-10-CM

## 2019-03-05 LAB — CHLORIDE
Calcium: 8.2
Carbon Dioxide, Total: 27
Chloride: 98

## 2019-03-05 NOTE — Progress Notes (Signed)
Subjective:   Bailey [name] is a 85 y.o. female who presents for an Initial Medicare Annual Wellness Visit in the Assisted Living at [facility]       Objective:    Today's Vitals   03/05/19 1145 03/05/19 1518 03/08/19 1417  BP: (!) 160/92    Pulse: 63    Resp: 16    Temp: 98.1 F (36.7 C)    SpO2: 99%    Weight: 105 lb 8 oz (47.9 kg)    Height: 5\' 8"  (1.727 m)    PainSc:  5  5    Body mass index is 16.04 kg/m.  Advanced Directives 12/20/2018 09/28/2018 09/26/2018 08/21/2018 07/27/2018 07/17/2018 07/04/2018  Does Patient Have a Medical Advance Directive? Yes Yes Yes Yes Yes Yes Yes  Type of Advance Directive Living will;Healthcare Power of Attorney;Out of facility DNR (pink MOST or yellow form) [place];Out of facility DNR (pink MOST or yellow form);Living will [place];Out of facility DNR (pink MOST or yellow form);Living will [place];Living will;Out of facility DNR (pink MOST or yellow form) [place];Out of facility DNR (pink MOST or yellow form);Living will [place];Out of facility DNR (pink MOST or yellow form) [place];Out of facility DNR (pink MOST or yellow form)  Does patient want to make changes to medical advance directive? No - Patient declined No - Patient declined No - Patient declined No - Guardian declined No - Patient declined No - Patient declined No - Patient declined  Copy of [place] in Chart? Yes - validated most recent copy scanned in chart (See row information) Yes - validated most recent copy scanned in chart (See row information) Yes - validated most recent copy scanned in chart (See row information) Yes - validated most recent copy scanned in chart (See row information) Yes - validated most recent copy scanned in chart (See row information) Yes - validated most recent copy scanned in chart (See row information) Yes -  validated most recent copy scanned in chart (See row information)  Would patient like information on creating a medical advance directive? - - - - - - -  Pre-existing out of facility DNR order (yellow form or pink MOST form) Yellow form placed in chart (order not valid for inpatient use);Pink MOST form placed in chart (order not valid for inpatient use) Yellow form placed in chart (order not valid for inpatient use);Pink MOST form placed in chart (order not valid for inpatient use) Yellow form placed in chart (order not valid for inpatient use);Pink MOST form placed in chart (order not valid for inpatient use) Yellow form placed in chart (order not valid for inpatient use);Pink MOST form placed in chart (order not valid for inpatient use) Yellow form placed in chart (order not valid for inpatient use);Pink MOST form placed in chart (order not valid for inpatient use) Yellow form placed in chart (order not valid for inpatient use);Pink MOST form placed in chart (order not valid for inpatient use) Yellow form placed in chart (order not valid for inpatient use);Pink MOST form placed in chart (order not valid for inpatient use)    Current Medications (verified) Outpatient Encounter Medications as of 03/05/2019  Medication Sig  . acetaminophen (TYLENOL) 500 MG tablet Take 1,000 mg by mouth daily.   Marland Kitchen acetaminophen (TYLENOL) 500 MG tablet Take 500 mg by mouth every morning.   Marland Kitchen amLODipine (NORVASC) 5 MG tablet Take  5 mg by mouth daily.  . cloNIDine (CATAPRES) 0.1 MG tablet Take 0.1 mg by mouth daily.   . famotidine (PEPCID) 10 MG tablet Take 10 mg by mouth at bedtime.  . feeding supplement (BOOST / RESOURCE BREEZE) LIQD Take 1 Container by mouth 2 (two) times daily between meals. (1000 & 1600) Prefers Chocolate  . fentaNYL (DURAGESIC) 12 MCG/HR Place 1 patch onto the skin every 3 (three) days.  . hydrALAZINE (APRESOLINE) 100 MG tablet Take 100 mg by mouth daily. If SBP >160 recheck B/P and document in vital  sign record.   . hydrALAZINE (APRESOLINE) 50 MG tablet Take 50 mg by mouth 2 (two) times daily.  Marland Kitchen ipratropium (ATROVENT) 0.06 % nasal spray Place 2 sprays into both nostrils 2 (two) times daily as needed for rhinitis. PRN-NASAL DRAINAGE  . irbesartan (AVAPRO) 300 MG tablet Take 300 mg by mouth daily. HOLD for B/P [id number], If systolic B/P is over 0000000, recheck B/P, document in Vital Signs.  Marland Kitchen levothyroxine (SYNTHROID, LEVOTHROID) 150 MCG tablet Take 150 mcg by mouth daily before breakfast. (0730)  . Lidocaine (ASPERCREME LIDOCAINE) 4 % PTCH Apply 3 patches topically once. Apply 1 patch to the left hip, left knee, and the right shoulder in the morning. Remove in the evening. (Patient taking differently: Apply 3 patches topically 2 (two) times daily. Apply 1 patch to the left hip, left knee, and the right shoulder in the morning. Remove in the evening.)  . loratadine (CLARITIN) 10 MG tablet Take 1 tablet (10 mg total) by mouth daily.  . Menthol, Topical Analgesic, (BIOFREEZE) 4 % GEL Apply 1 application topically 3 (three) times daily as needed. Knee and hand pain  . mirtazapine (REMERON) 30 MG tablet Take 30 mg by mouth at bedtime.  . ondansetron (ZOFRAN) 4 MG tablet Take 4 mg by mouth every 8 (eight) hours as needed for nausea or vomiting.  . Oxycodone HCl 10 MG TABS Take 10 mg by mouth every 8 (eight) hours as needed.  Marland Kitchen oxyCODONE-acetaminophen (PERCOCET) 10-325 MG tablet Take one tablet by mouth four times daily 6 am, 12 am, 6 pm, and 12 pm for pain  . pantoprazole (PROTONIX) 40 MG tablet Take 40 mg by mouth daily.  . polyethylene glycol (MIRALAX / GLYCOLAX) packet Take 17 g by mouth daily.  . promethazine (PHENERGAN) 25 MG suppository Place 25 mg rectally every 6 (six) hours as needed for nausea or vomiting. nausea WITH vomiting x 24 hours. Clear liquid diet x24 hours. If NOT effective, notify MD.  . psyllium (METAMUCIL) 58.6 % powder Take 1 packet by mouth daily. Mix in 6 oz of water or juice  .  sennosides-docusate sodium (SENOKOT-S) 8.6-50 MG tablet Take 1 tablet by mouth daily. Hold for loose stool  . sodium chloride 1 g tablet Take 1 g by mouth 2 (two) times daily.   Marland Kitchen UNABLE TO FIND Med Name: Hydrocolloid dressing 4x4. Change dressing to the coccyx as needed. Check patency daily  . zinc oxide 20 % ointment Apply 1 application topically 3 (three) times daily as needed for irritation. Apply to buttocks for redness/dark pink/excoriation   No facility-administered encounter medications on file as of 03/05/2019.     Allergies (verified) Aspirin, Hctz [hydrochlorothiazide], and Penicillins   History: Past Medical History:  Diagnosis Date  . Acquired cyst of kidney    left  . Allergic rhinitis, cause unspecified   . Anemia, unspecified   . Chronic hyponatremia 09/05/2015  . CKD stage 2 due to  type 2 diabetes mellitus ([place]) 11/18/2014  . Closed fracture of unspecified part of vertebral column without mention of spinal cord injury    T7 s/p kyphoplasty, T12  . Edema 2015  . Herpes simplex without mention of complication   . History of Epstein-Barr virus infection   . Hyposmolality and/or hyponatremia    07/13/15 Na 131  . Insomnia   . Insomnia, unspecified   . Left cavernous carotid aneurysm 08/2008   1-1/30mm aneurysm of cavernous carotid artery  . Malignant neoplasm of breast (female), unspecified site 1990   left. Had surgerey and chemo, but no radiation  . 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 Neuropathy   . Osteoarthrosis of knee    bilaterally  . Osteoarthrosis, hip   . Osteoarthrosis, unspecified whether generalized or localized, forearm    bilaterally wrist  . Other and unspecified nonspecific immunological findings    positive ANA  . Other B-complex deficiencies   . Pain in joint, site unspecified    severe, diffuse, chronic pain  . Positive ANA (antinuclear antibody)   . Pulmonary embolism ([place]) 05/07/2015  . Pure  hypercholesterolemia   . Senile osteoporosis   . Unspecified essential hypertension   . Unspecified gastritis and gastroduodenitis without mention of hemorrhage   . Unspecified hypothyroidism   . Unspecified vitamin D deficiency    Past Surgical History:  Procedure Laterality Date  . BREAST SURGERY Left   . DILATATION & CURETTAGE/HYSTEROSCOPY WITH MYOSURE N/A 06/27/2017   Procedure: DILATATION & CURETTAGE/HYSTEROSCOPY WITH MYOSURE;  Surgeon: Servando Salina, MD;  Location: [facility];  Service: Gynecology;  Laterality: N/A;  . DILATION AND CURETTAGE OF UTERUS  X 2  . FEMUR IM NAIL Right 03/18/2015   Procedure: INTRAMEDULLARY (IM) NAIL FEMORAL;  Surgeon: Rod Can, MD;  Location: [facility];  Service: Orthopedics;  Laterality: Right;  . FRACTURE SURGERY     hip  . KYPHOPLASTY  07/03/2010   T12  . KYPHOPLASTY     C7  . LAPAROSCOPIC CHOLECYSTECTOMY  09/20/2006  . MASTECTOMY Left 04/29/1989  . MIDDLE EAR SURGERY Bilateral 1938  . NASAL SINUS SURGERY  1990 & 2000  . ORIF HIP FRACTURE Left 06/13/2007   following a syncopal episode  . TONSILLECTOMY  1968   No family history on file. Social History   Socioeconomic History  . Marital status: Widowed    Spouse name: Not on file  . Number of children: Not on file  . Years of education: Not on file  . Highest education level: Not on file  Occupational History  . Occupation: retired Marine scientist  Social Needs  . Financial resource strain: Not hard at all  . Food insecurity    Worry: Never true    Inability: Never true  . Transportation needs    Medical: No    Non-medical: No  Tobacco Use  . Smoking status: Never Smoker  . Smokeless tobacco: Never Used  Substance and Sexual Activity  . Alcohol use: Yes    Comment: 05/08/2015 "I'll have a glass of white wine q now and then"  . Drug use: No  . Sexual activity: Never  Lifestyle  . Physical activity    Days per week: 7 days    Minutes per session: 10 min  . Stress: Only a little   Relationships  . Social Herbalist on phone: Three times a week    Gets together: Three times a week  Attends religious service: Never    Active member of club or organization: No    Attends meetings of clubs or organizations: Never    Relationship status: Widowed  Other Topics Concern  . Not on file  Social History Narrative   Lives at [facility] since 10/16/2013, moved from [place]    Married 1968   Never smoked   No alcohol    Exercise walking, walks with cane   POA/Living Will             Tobacco Counseling Counseling given: Not Answered   Clinical Intake:  Pre-visit preparation completed: Yes  Pain : 0-10 Pain Score: 5  Pain Type: Chronic pain Pain Location: Leg Pain Orientation: Right, Left, Lower Pain Descriptors / Indicators: Aching, Nagging, Tender Pain Onset: More than a month ago Pain Frequency: Constant Pain Relieving Factors: analgesics Effect of Pain on Daily Activities: difficulty getting started sometime in morning  Pain Relieving Factors: analgesics  Nutritional Status: BMI <19  Underweight Nutritional Risks: Unintentional weight loss, Failure to thrive Diabetes: No  How often do you need to have someone help you when you read instructions, pamphlets, or other written materials from your doctor or pharmacy?: 1 - Never What is the last grade level you completed in school?: hospital based nursing program.  Interpreter Needed?: No  Information entered by :: [place] NP   Activities of Daily Living In your present state of health, do you have any difficulty performing the following activities: 03/08/2019  Hearing? Y  Vision? N  Difficulty concentrating or making decisions? Y  Walking or climbing stairs? Y  Dressing or bathing? Y  Doing errands, shopping? Y  Some recent data might be hidden     Immunizations and Health Maintenance Immunization History  Administered Date(s) Administered  . Influenza [place] 02/13/2013  .  Influenza-Unspecified 02/27/2014, 02/12/2015, 02/25/2016, 03/08/2017, 02/19/2018  . PPD Test 09/09/2015  . Pneumococcal Conjugate-13 05/05/2016  . Pneumococcal Polysaccharide-23 02/13/2013  . Td 05/05/2016  . Zoster 05/04/2016   Health Maintenance Due  Topic Date Due  . MAMMOGRAM  03/21/1945    Patient Care Team: Virgie Dad, MD as PCP - General (Internal Medicine) Mylie Mccurley X, NP as Nurse Practitioner (Internal Medicine)  Indicate any recent Medical Services you may have received from other than [facility] providers in the past year (date may be approximate).     Assessment:   This is a routine wellness examination for Khiley.  Hearing/Vision screen No exam data present  Dietary issues and exercise activities discussed:    Goals    . Increase physical activity    . Increase physical activity      Depression Screen PHQ 2/9 Scores 01/08/2018 01/02/2017 10/20/2015 03/30/2015 11/18/2014 07/22/2014 10/22/2013  PHQ - 2 Score 0 0 0 0 0 1 1    Fall Risk Fall Risk  01/08/2018 01/02/2017 11/10/2015 10/20/2015 10/20/2015  Falls in the past year? No No No Yes No  Number falls in past yr: - - - 2 or more -  Injury with Fall? - - - Yes -  Risk Factor Category  - - - - -  Risk for fall due to : - - - History of fall(s);Impaired balance/gait -  Follow up - - - - -    Is the patient's home free of loose throw rugs in walkways, pet beds, electrical cords, etc?   none      Grab bars in the bathroom? yes  Handrails on the stairs?  yes      Adequate lighting?   yes  Timed Get Up and Go Performed 8 seconds  Cognitive Function: MMSE - Mini Mental State Exam 01/08/2018 01/02/2017  Orientation to time 4 4  Orientation to Place 5 5  Registration 3 3  Attention/ Calculation 5 5  Recall 3 3  Language- name 2 objects 2 2  Language- repeat 1 1  Language- follow 3 step command 3 3  Language- read & follow direction 1 1  Write a sentence 1 1  Copy design 1 1  Total score 29 29        Screening  Tests Health Maintenance  Topic Date Due  . MAMMOGRAM  03/21/1945  . TETANUS/TDAP  05/05/2026  . INFLUENZA VACCINE  Completed  . DEXA SCAN  Completed  . PNA vac Low Risk Adult  Completed    Qualifies for Shingles Vaccine? yes  Cancer Screenings: Lung: Low Dose CT Chest recommended if Age 87-80 years, 30 pack-year currently smoking OR have quit w/in 15years. Patient does not qualify. Breast: Up to date on Mammogram? Does not qualify Up to date of Bone Density/Dexa? 2 years ago  Colorectal: does not qualify  Additional Screenings: none Hepatitis C Screening:      Plan:   DEXA, Shingrix due  I have personally reviewed and noted the following in the patient's chart:   . Medical and social history . Use of alcohol, tobacco or illicit drugs  . Current medications and supplements . Functional ability and status . Nutritional status . Physical activity . Advanced directives . List of other physicians . Hospitalizations, surgeries, and ER visits in previous 12 months . Vitals . Screenings to include cognitive, depression, and falls . Referrals and appointments  In addition, I have reviewed and discussed with patient certain preventive protocols, quality metrics, and best practice recommendations. A written personalized care plan for preventive services as well as general preventive health recommendations were provided to patient.     Anetra Czerwinski X Kelci Petrella, NP   03/08/2019        Subjective:   Bailey [name] is a 85 y.o. female who presents for Medicare Annual (Subsequent) preventive examination.       Objective:     Vitals: BP (!) 160/92   Pulse 63   Temp 98.1 F (36.7 C)   Resp 16   Ht 5\' 8"  (1.727 m)   Wt 105 lb 8 oz (47.9 kg)   SpO2 99%   BMI 16.04 kg/m   Body mass index is 16.04 kg/m.  Advanced Directives 12/20/2018 09/28/2018 09/26/2018 08/21/2018 07/27/2018 07/17/2018 07/04/2018  Does Patient Have a Medical Advance Directive? Yes Yes Yes Yes Yes Yes Yes  Type of  Advance Directive Living will;Healthcare Power of Attorney;Out of facility DNR (pink MOST or yellow form) [place];Out of facility DNR (pink MOST or yellow form);Living will [place];Out of facility DNR (pink MOST or yellow form);Living will [place];Living will;Out of facility DNR (pink MOST or yellow form) [place];Out of facility DNR (pink MOST or yellow form);Living will [place];Out of facility DNR (pink MOST or yellow form) [place];Out of facility DNR (pink MOST or yellow form)  Does patient want to make changes to medical advance directive? No - Patient declined No - Patient declined No - Patient declined No - Guardian declined No - Patient declined No - Patient declined No - Patient  declined  Copy of [place] in Chart? Yes - validated most recent copy scanned in chart (See row information) Yes - validated most recent copy scanned in chart (See row information) Yes - validated most recent copy scanned in chart (See row information) Yes - validated most recent copy scanned in chart (See row information) Yes - validated most recent copy scanned in chart (See row information) Yes - validated most recent copy scanned in chart (See row information) Yes - validated most recent copy scanned in chart (See row information)  Would patient like information on creating a medical advance directive? - - - - - - -  Pre-existing out of facility DNR order (yellow form or pink MOST form) Yellow form placed in chart (order not valid for inpatient use);Pink MOST form placed in chart (order not valid for inpatient use) Yellow form placed in chart (order not valid for inpatient use);Pink MOST form placed in chart (order not valid for inpatient use) Yellow form placed in chart (order not valid for inpatient use);Pink MOST form placed in chart (order not valid for inpatient use) Yellow form  placed in chart (order not valid for inpatient use);Pink MOST form placed in chart (order not valid for inpatient use) Yellow form placed in chart (order not valid for inpatient use);Pink MOST form placed in chart (order not valid for inpatient use) Yellow form placed in chart (order not valid for inpatient use);Pink MOST form placed in chart (order not valid for inpatient use) Yellow form placed in chart (order not valid for inpatient use);Pink MOST form placed in chart (order not valid for inpatient use)    Tobacco Social History   Tobacco Use  Smoking Status Never Smoker  Smokeless Tobacco Never Used     Counseling given: Not Answered   Clinical Intake:  Pre-visit preparation completed: Yes  Pain : 0-10 Pain Score: 5  Pain Type: Chronic pain Pain Location: Leg Pain Orientation: Right, Left, Lower Pain Descriptors / Indicators: Aching, Nagging, Tender Pain Onset: More than a month ago Pain Frequency: Constant Pain Relieving Factors: analgesics Effect of Pain on Daily Activities: difficulty getting started sometime in morning  Pain Relieving Factors: analgesics  Nutritional Status: BMI <19  Underweight Nutritional Risks: Unintentional weight loss, Failure to thrive Diabetes: No  How often do you need to have someone help you when you read instructions, pamphlets, or other written materials from your doctor or pharmacy?: 1 - Never What is the last grade level you completed in school?: hospital based nursing program.  Interpreter Needed?: No  Information entered by :: Joseandres Mazer Bretta Bang NP  Past Medical History:  Diagnosis Date  . Acquired cyst of kidney    left  . Allergic rhinitis, cause unspecified   . Anemia, unspecified   . Chronic hyponatremia 09/05/2015  . CKD stage 2 due to type 2 diabetes mellitus ([place]) 11/18/2014  . Closed fracture of unspecified part of vertebral column without mention of spinal cord injury    T7 s/p kyphoplasty, T12  . Edema 2015  . Herpes  simplex without mention of complication   . History of Epstein-Barr virus infection   . Hyposmolality and/or hyponatremia    07/13/15 Na 131  . Insomnia   . Insomnia, unspecified   . Left cavernous carotid aneurysm 08/2008   1-1/70mm aneurysm of cavernous carotid artery  . Malignant neoplasm of breast (female), unspecified site 1990   left. Had surgerey and chemo, but no radiation  . Mononeuritis of lower limb,  unspecified    sensory motor neuropathy of both legs  . Neuropathy   . Osteoarthrosis of knee    bilaterally  . Osteoarthrosis, hip   . Osteoarthrosis, unspecified whether generalized or localized, forearm    bilaterally wrist  . Other and unspecified nonspecific immunological findings    positive ANA  . Other B-complex deficiencies   . Pain in joint, site unspecified    severe, diffuse, chronic pain  . Positive ANA (antinuclear antibody)   . Pulmonary embolism ([place]) 05/07/2015  . Pure hypercholesterolemia   . Senile osteoporosis   . Unspecified essential hypertension   . Unspecified gastritis and gastroduodenitis without mention of hemorrhage   . Unspecified hypothyroidism   . Unspecified vitamin D deficiency    Past Surgical History:  Procedure Laterality Date  . BREAST SURGERY Left   . DILATATION & CURETTAGE/HYSTEROSCOPY WITH MYOSURE N/A 06/27/2017   Procedure: DILATATION & CURETTAGE/HYSTEROSCOPY WITH MYOSURE;  Surgeon: Servando Salina, MD;  Location: [facility];  Service: Gynecology;  Laterality: N/A;  . DILATION AND CURETTAGE OF UTERUS  X 2  . FEMUR IM NAIL Right 03/18/2015   Procedure: INTRAMEDULLARY (IM) NAIL FEMORAL;  Surgeon: Rod Can, MD;  Location: [facility];  Service: Orthopedics;  Laterality: Right;  . FRACTURE SURGERY     hip  . KYPHOPLASTY  07/03/2010   T12  . KYPHOPLASTY     C7  . LAPAROSCOPIC CHOLECYSTECTOMY  09/20/2006  . MASTECTOMY Left 04/29/1989  . MIDDLE EAR SURGERY Bilateral 1938  . NASAL SINUS SURGERY  1990 & 2000  . ORIF HIP FRACTURE Left  06/13/2007   following a syncopal episode  . TONSILLECTOMY  1968   No family history on file. Social History   Socioeconomic History  . Marital status: Widowed    Spouse name: Not on file  . Number of children: Not on file  . Years of education: Not on file  . Highest education level: Not on file  Occupational History  . Occupation: retired Marine scientist  Social Needs  . Financial resource strain: Not hard at all  . Food insecurity    Worry: Never true    Inability: Never true  . Transportation needs    Medical: No    Non-medical: No  Tobacco Use  . Smoking status: Never Smoker  . Smokeless tobacco: Never Used  Substance and Sexual Activity  . Alcohol use: Yes    Comment: 05/08/2015 "I'll have a glass of white wine q now and then"  . Drug use: No  . Sexual activity: Never  Lifestyle  . Physical activity    Days per week: 7 days    Minutes per session: 10 min  . Stress: Only a little  Relationships  . Social Herbalist on phone: Three times a week    Gets together: Three times a week    Attends religious service: Never    Active member of club or organization: No    Attends meetings of clubs or organizations: Never    Relationship status: Widowed  Other Topics Concern  . Not on file  Social History Narrative   Lives at [facility] since 10/16/2013, moved from [place]    Married 1968   Never smoked   No alcohol    Exercise walking, walks with cane   POA/Living Will             Outpatient Encounter Medications as of 03/05/2019  Medication Sig  . acetaminophen (TYLENOL) 500 MG tablet Take 1,000  mg by mouth daily.   Marland Kitchen acetaminophen (TYLENOL) 500 MG tablet Take 500 mg by mouth every morning.   Marland Kitchen amLODipine (NORVASC) 5 MG tablet Take 5 mg by mouth daily.  . cloNIDine (CATAPRES) 0.1 MG tablet Take 0.1 mg by mouth daily.   . famotidine (PEPCID) 10 MG tablet Take 10 mg by mouth at bedtime.  . feeding supplement (BOOST / RESOURCE BREEZE) LIQD Take 1 Container by mouth  2 (two) times daily between meals. (1000 & 1600) Prefers Chocolate  . fentaNYL (DURAGESIC) 12 MCG/HR Place 1 patch onto the skin every 3 (three) days.  . hydrALAZINE (APRESOLINE) 100 MG tablet Take 100 mg by mouth daily. If SBP >160 recheck B/P and document in vital sign record.   . hydrALAZINE (APRESOLINE) 50 MG tablet Take 50 mg by mouth 2 (two) times daily.  Marland Kitchen ipratropium (ATROVENT) 0.06 % nasal spray Place 2 sprays into both nostrils 2 (two) times daily as needed for rhinitis. PRN-NASAL DRAINAGE  . irbesartan (AVAPRO) 300 MG tablet Take 300 mg by mouth daily. HOLD for B/P [id number], If systolic B/P is over 0000000, recheck B/P, document in Vital Signs.  Marland Kitchen levothyroxine (SYNTHROID, LEVOTHROID) 150 MCG tablet Take 150 mcg by mouth daily before breakfast. (0730)  . Lidocaine (ASPERCREME LIDOCAINE) 4 % PTCH Apply 3 patches topically once. Apply 1 patch to the left hip, left knee, and the right shoulder in the morning. Remove in the evening. (Patient taking differently: Apply 3 patches topically 2 (two) times daily. Apply 1 patch to the left hip, left knee, and the right shoulder in the morning. Remove in the evening.)  . loratadine (CLARITIN) 10 MG tablet Take 1 tablet (10 mg total) by mouth daily.  . Menthol, Topical Analgesic, (BIOFREEZE) 4 % GEL Apply 1 application topically 3 (three) times daily as needed. Knee and hand pain  . mirtazapine (REMERON) 30 MG tablet Take 30 mg by mouth at bedtime.  . ondansetron (ZOFRAN) 4 MG tablet Take 4 mg by mouth every 8 (eight) hours as needed for nausea or vomiting.  . Oxycodone HCl 10 MG TABS Take 10 mg by mouth every 8 (eight) hours as needed.  Marland Kitchen oxyCODONE-acetaminophen (PERCOCET) 10-325 MG tablet Take one tablet by mouth four times daily 6 am, 12 am, 6 pm, and 12 pm for pain  . pantoprazole (PROTONIX) 40 MG tablet Take 40 mg by mouth daily.  . polyethylene glycol (MIRALAX / GLYCOLAX) packet Take 17 g by mouth daily.  . promethazine (PHENERGAN) 25 MG suppository  Place 25 mg rectally every 6 (six) hours as needed for nausea or vomiting. nausea WITH vomiting x 24 hours. Clear liquid diet x24 hours. If NOT effective, notify MD.  . psyllium (METAMUCIL) 58.6 % powder Take 1 packet by mouth daily. Mix in 6 oz of water or juice  . sennosides-docusate sodium (SENOKOT-S) 8.6-50 MG tablet Take 1 tablet by mouth daily. Hold for loose stool  . sodium chloride 1 g tablet Take 1 g by mouth 2 (two) times daily.   Marland Kitchen UNABLE TO FIND Med Name: Hydrocolloid dressing 4x4. Change dressing to the coccyx as needed. Check patency daily  . zinc oxide 20 % ointment Apply 1 application topically 3 (three) times daily as needed for irritation. Apply to buttocks for redness/dark pink/excoriation   No facility-administered encounter medications on file as of 03/05/2019.     Activities of Daily Living In your present state of health, do you have any difficulty performing the following activities: 03/08/2019  Hearing?  Y  Vision? N  Difficulty concentrating or making decisions? Y  Walking or climbing stairs? Y  Dressing or bathing? Y  Doing errands, shopping? Y  Some recent data might be hidden    Patient Care Team: Virgie Dad, MD as PCP - General (Internal Medicine) Cahlil Sattar X, NP as Nurse Practitioner (Internal Medicine)    Assessment:   This is a routine wellness examination for Bailey [name].  Exercise Activities and Dietary recommendations    Goals    . Increase physical activity    . Increase physical activity       Fall Risk Fall Risk  01/08/2018 01/02/2017 11/10/2015 10/20/2015 10/20/2015  Falls in the past year? No No No Yes No  Number falls in past yr: - - - 2 or more -  Injury with Fall? - - - Yes -  Risk Factor Category  - - - - -  Risk for fall due to : - - - History of fall(s);Impaired balance/gait -  Follow up - - - - -   Is the patient's home free of loose throw rugs in walkways, pet beds, electrical cords, etc?   No      Grab bars in the bathroom? yes       Handrails on the stairs?   yes      Adequate lighting?   yes Timed Get Up and Go performed: 8 seconds  Depression Screen PHQ 2/9 Scores 01/08/2018 01/02/2017 10/20/2015 03/30/2015  PHQ - 2 Score 0 0 0 0     Cognitive Function MMSE - Mini Mental State Exam 01/08/2018 01/02/2017  Orientation to time 4 4  Orientation to Place 5 5  Registration 3 3  Attention/ Calculation 5 5  Recall 3 3  Language- name 2 objects 2 2  Language- repeat 1 1  Language- follow 3 step command 3 3  Language- read & follow direction 1 1  Write a sentence 1 1  Copy design 1 1  Total score 29 29        Immunization History  Administered Date(s) Administered  . Influenza Split 02/13/2013  . Influenza-Unspecified 02/27/2014, 02/12/2015, 02/25/2016, 03/08/2017, 02/19/2018  . PPD Test 09/09/2015  . Pneumococcal Conjugate-13 05/05/2016  . Pneumococcal Polysaccharide-23 02/13/2013  . Td 05/05/2016  . Zoster 05/04/2016    Qualifies for Shingles Vaccine?yes  Screening Tests Health Maintenance  Topic Date Due  . MAMMOGRAM  03/21/1945  . TETANUS/TDAP  05/05/2026  . INFLUENZA VACCINE  Completed  . DEXA SCAN  Completed  . PNA vac Low Risk Adult  Completed    Cancer Screenings: Lung: Low Dose CT Chest recommended if Age 19-80 years, 30 pack-year currently smoking OR have quit w/in 15years. Patient does not qualify. Breast:  Up to date on Mammogram? Doesn't qulify Up to date of Bone Density/Dexa? DEXA due Colorectal: doesn't qulify  Additional Screenings: none Hepatitis C Screening:      Plan:   DEXA Shingrix order sent.   I have personally reviewed and noted the following in the patient's chart:   . Medical and social history . Use of alcohol, tobacco or illicit drugs  . Current medications and supplements . Functional ability and status . Nutritional status . Physical activity . Advanced directives . List of other physicians . Hospitalizations, surgeries, and ER visits in previous 12  months . Vitals . Screenings to include cognitive, depression, and falls . Referrals and appointments  In addition, I have reviewed and discussed with patient certain preventive protocols, quality  metrics, and best practice recommendations. A written personalized care plan for preventive services as well as general preventive health recommendations were provided to patient.     Brody Bonneau X Savior Himebaugh, NP  03/08/2019

## 2019-03-08 ENCOUNTER — Encounter: Payer: Self-pay | Admitting: Nurse Practitioner

## 2019-03-13 ENCOUNTER — Other Ambulatory Visit: Payer: Self-pay

## 2019-03-13 MED ORDER — OXYCODONE-ACETAMINOPHEN 10-325 MG PO TABS: ORAL_TABLET | ORAL | 0 refills | Status: DC

## 2019-03-20 ENCOUNTER — Encounter: Payer: Self-pay | Admitting: Internal Medicine

## 2019-03-20 ENCOUNTER — Non-Acute Institutional Stay: Payer: Medicare Other | Admitting: Internal Medicine

## 2019-03-20 DIAGNOSIS — Z86711 Personal history of pulmonary embolism: Secondary | ICD-10-CM | POA: Diagnosis not present

## 2019-03-20 DIAGNOSIS — R05 Cough: Secondary | ICD-10-CM | POA: Diagnosis not present

## 2019-03-20 DIAGNOSIS — I10 Essential (primary) hypertension: Secondary | ICD-10-CM | POA: Diagnosis not present

## 2019-03-20 DIAGNOSIS — N1832 Chronic kidney disease, stage 3b: Secondary | ICD-10-CM | POA: Diagnosis not present

## 2019-03-20 DIAGNOSIS — E039 Hypothyroidism, unspecified: Secondary | ICD-10-CM | POA: Diagnosis not present

## 2019-03-20 DIAGNOSIS — R0781 Pleurodynia: Secondary | ICD-10-CM

## 2019-03-20 NOTE — Addendum Note (Signed)
Addended by: Georgina Snell on: 03/20/2019 09:42 PM   Modules accepted: Level of Service

## 2019-03-20 NOTE — Progress Notes (Addendum)
Location: [place] of Service:  ALF (13)  Provider:   Code Status: DNR Goals of Care:  Advanced Directives 12/20/2018  Does Patient Have a Medical Advance Directive? Yes  Type of Advance Directive Living will;Healthcare Power of [place];Out of facility DNR (pink MOST or yellow form)  Does patient want to make changes to medical advance directive? No - Patient declined  Copy of [place] in Chart? Yes - validated most recent copy scanned in chart (See row information)  Would patient like information on creating a medical advance directive? -  Pre-existing out of facility DNR order (yellow form or pink MOST form) Yellow form placed in chart (order not valid for inpatient use);Pink MOST form placed in chart (order not valid for inpatient use)     Chief Complaint  Patient presents with  . Acute Visit    HPI: Patient is a 85 y.o. female seen today for an acute visit for Left sided Pleuritic Chest Pain with Cough  Patient is resident of [facility] in [facility] She has a history of uncontrolled hypertension, severe arthritis depression with anxiety.H/o PE in 2016,Hyponatremia, Chronic LE Edema, GERD,CKD Stage 3, Chronic Pain Syndrome  She was seen today as she c/o Severe Left Sided Pleuritic Chest Pain with Cough She did nto have any fever but productive cough.  She cannot cough due to Pain. Denies any SOB. It started last Night POX 97% on RA  Past Medical History:  Diagnosis Date  . Acquired cyst of kidney    left  . Allergic rhinitis, cause unspecified   . Anemia, unspecified   . Chronic hyponatremia 09/05/2015  . CKD stage 2 due to type 2 diabetes mellitus ([place]) 11/18/2014  . Closed fracture of unspecified part of vertebral column without mention of spinal cord injury    T7 s/p kyphoplasty, T12  . Edema 2015  . Herpes simplex without mention of complication   . History of Epstein-Barr virus infection   . Hyposmolality and/or hyponatremia    07/13/15 Na  131  . Insomnia   . Insomnia, unspecified   . 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 Malignant neoplasm of breast (female), unspecified site 1990   left. Had surgerey and chemo, but no radiation  . 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 Neuropathy   . Osteoarthrosis of knee    bilaterally  . Osteoarthrosis, hip   . Osteoarthrosis, unspecified whether generalized or localized, forearm    bilaterally wrist  . Other and unspecified nonspecific immunological findings    positive ANA  . Other B-complex deficiencies   . Pain in joint, site unspecified    severe, diffuse, chronic pain  . Positive ANA (antinuclear antibody)   . Pulmonary embolism ([place]) 05/07/2015  . Pure hypercholesterolemia   . Senile osteoporosis   . Unspecified essential hypertension   . Unspecified gastritis and gastroduodenitis without mention of hemorrhage   . Unspecified hypothyroidism   . Unspecified vitamin D deficiency     Past Surgical History:  Procedure Laterality Date  . BREAST SURGERY Left   . DILATATION & CURETTAGE/HYSTEROSCOPY WITH MYOSURE N/A 06/27/2017   Procedure: DILATATION & CURETTAGE/HYSTEROSCOPY WITH MYOSURE;  Surgeon: Servando Salina, MD;  Location: [place] [facility];  Service: Gynecology;  Laterality: N/A;  . DILATION AND CURETTAGE OF UTERUS  X 2  . FEMUR IM NAIL Right 03/18/2015   Procedure: INTRAMEDULLARY (IM)  NAIL FEMORAL;  Surgeon: Rod Can, MD;  Location: [facility];  Service: Orthopedics;  Laterality: Right;  . FRACTURE SURGERY     hip  . KYPHOPLASTY  07/03/2010   T12  . KYPHOPLASTY     C7  . LAPAROSCOPIC CHOLECYSTECTOMY  09/20/2006  . MASTECTOMY Left 04/29/1989  . MIDDLE EAR SURGERY Bilateral 1938  . NASAL SINUS SURGERY  1990 & 2000  . ORIF HIP FRACTURE Left 06/13/2007   following a syncopal episode  . TONSILLECTOMY  1968    Allergies  Allergen Reactions  . Aspirin     Drop in body temp  with large quantities, can tolerate low doses of aspirin  . Hctz [Hydrochlorothiazide] Other (See Comments)    Hyponatremia  . Penicillins Swelling    Has patient had a PCN reaction causing immediate rash, facial/tongue/throat swelling, SOB or lightheadedness with hypotension: No Has patient had a PCN reaction causing severe rash involving mucus membranes or skin necrosis: No Has patient had a PCN reaction that required hospitalization No Has patient had a PCN reaction occurring within the last 10 years: No If all of the above answers are "NO", then may proceed with Cephalosporin use.    Outpatient Encounter Medications as of 03/20/2019  Medication Sig  . acetaminophen (TYLENOL) 500 MG tablet Take 1,000 mg by mouth daily.   Marland Kitchen acetaminophen (TYLENOL) 500 MG tablet Take 500 mg by mouth every morning.   Marland Kitchen amLODipine (NORVASC) 5 MG tablet Take 5 mg by mouth daily.  . cloNIDine (CATAPRES) 0.1 MG tablet Take 0.1 mg by mouth daily.   . famotidine (PEPCID) 10 MG tablet Take 10 mg by mouth at bedtime.  . feeding supplement (BOOST / RESOURCE BREEZE) LIQD Take 1 Container by mouth 2 (two) times daily between meals. (1000 & 1600) Prefers Chocolate  . fentaNYL (DURAGESIC) 12 MCG/HR Place 1 patch onto the skin every 3 (three) days.  . hydrALAZINE (APRESOLINE) 100 MG tablet Take 100 mg by mouth daily. If SBP >160 recheck B/P and document in vital sign record.   . hydrALAZINE (APRESOLINE) 50 MG tablet Take 50 mg by mouth 2 (two) times daily.  Marland Kitchen ipratropium (ATROVENT) 0.06 % nasal spray Place 2 sprays into both nostrils 2 (two) times daily as needed for rhinitis. PRN-NASAL DRAINAGE  . irbesartan (AVAPRO) 300 MG tablet Take 300 mg by mouth daily. HOLD for B/P [id number], If systolic B/P is over 0000000, recheck B/P, document in Vital Signs.  Marland Kitchen levothyroxine (SYNTHROID, LEVOTHROID) 150 MCG tablet Take 150 mcg by mouth daily before breakfast. (0730)  . Lidocaine (ASPERCREME LIDOCAINE) 4 % PTCH Apply 3 patches topically  once. Apply 1 patch to the left hip, left knee, and the right shoulder in the morning. Remove in the evening. (Patient taking differently: Apply 3 patches topically 2 (two) times daily. Apply 1 patch to the left hip, left knee, and the right shoulder in the morning. Remove in the evening.)  . loratadine (CLARITIN) 10 MG tablet Take 1 tablet (10 mg total) by mouth daily.  . Menthol, Topical Analgesic, (BIOFREEZE) 4 % GEL Apply 1 application topically 3 (three) times daily as needed. Knee and hand pain  . mirtazapine (REMERON) 30 MG tablet Take 30 mg by mouth at bedtime.  . ondansetron (ZOFRAN) 4 MG tablet Take 4 mg by mouth every 8 (eight) hours as needed for nausea or vomiting.  . Oxycodone HCl 10 MG TABS Take 10 mg by mouth every 8 (eight) hours as needed.  Marland Kitchen oxyCODONE-acetaminophen (PERCOCET) 10-325  MG tablet Take one tablet by mouth four times daily 6 am, 12 am, 6 pm, and 12 pm for pain  . pantoprazole (PROTONIX) 40 MG tablet Take 40 mg by mouth daily.  . polyethylene glycol (MIRALAX / GLYCOLAX) packet Take 17 g by mouth daily.  . promethazine (PHENERGAN) 25 MG suppository Place 25 mg rectally every 6 (six) hours as needed for nausea or vomiting. nausea WITH vomiting x 24 hours. Clear liquid diet x24 hours. If NOT effective, notify MD.  . psyllium (METAMUCIL) 58.6 % powder Take 1 packet by mouth daily. Mix in 6 oz of water or juice  . sennosides-docusate sodium (SENOKOT-S) 8.6-50 MG tablet Take 1 tablet by mouth daily. Hold for loose stool  . sodium chloride 1 g tablet Take 1 g by mouth 2 (two) times daily.   Marland Kitchen UNABLE TO FIND Med Name: Hydrocolloid dressing 4x4. Change dressing to the coccyx as needed. Check patency daily  . zinc oxide 20 % ointment Apply 1 application topically 3 (three) times daily as needed for irritation. Apply to buttocks for redness/dark pink/excoriation   No facility-administered encounter medications on file as of 03/20/2019.     Review of Systems:  Review of Systems   Constitutional: Negative.   HENT: Negative.   Respiratory: Negative for cough.   Cardiovascular: Positive for chest pain. Negative for leg swelling.  Gastrointestinal: Negative.   Genitourinary: Negative.   Musculoskeletal: Positive for back pain and gait problem.  Neurological: Positive for weakness.  Psychiatric/Behavioral: Negative.     Health Maintenance  Topic Date Due  . MAMMOGRAM  03/21/1945  . TETANUS/TDAP  05/05/2026  . INFLUENZA VACCINE  Completed  . DEXA SCAN  Completed  . PNA vac Low Risk Adult  Completed    Physical Exam: Vitals:   03/20/19 1209  BP: (!) 160/92  Pulse: 78  Resp: 18  Temp: 98.7 F (37.1 C)  SpO2: 97%   There is no height or weight on file to calculate BMI. Physical Exam Vitals signs reviewed.  Constitutional:      Appearance: Normal appearance.  HENT:     Head: Normocephalic.     Nose: Nose normal.     Mouth/Throat:     Mouth: Mucous membranes are moist.     Pharynx: Oropharynx is clear.  Eyes:     Pupils: Pupils are equal, round, and reactive to light.  Neck:     Musculoskeletal: Neck supple.  Cardiovascular:     Rate and Rhythm: Normal rate and regular rhythm.     Heart sounds: No murmur.  Pulmonary:     Effort: Pulmonary effort is normal.     Comments: Patient had Rales in Left Lower Lung Abdominal:     General: Abdomen is flat. Bowel sounds are normal. There is no distension.     Palpations: Abdomen is soft.     Tenderness: There is no abdominal tenderness.  Musculoskeletal:        General: No swelling.  Skin:    General: Skin is warm and dry.  Neurological:     General: No focal deficit present.     Mental Status: She is alert and oriented to person, place, and time.  Psychiatric:        Mood and Affect: Mood normal.        Thought Content: Thought content normal.     Labs reviewed: Basic Metabolic Panel: Recent Labs    07/12/18 07/16/18  09/17/18 12/04/18 12/20/18  NA 127* 123*   < >  130* 131* 131*  K 4.3 4.0    < > 3.8 3.7 3.7  CL 94  --   --   --   --  98  CO2 24  --   --   --   --  27  BUN 25* 26*   < > 16 14 18   CREATININE 1.1 1.1   < > 0.9 0.9 1.0  CALCIUM 9.3  --   --   --   --  8.2  TSH  --  2.73  --   --   --   --    < > = values in this interval not displayed.   Liver Function Tests: Recent Labs    12/04/18  AST 16  ALT 7   No results for input(s): LIPASE, AMYLASE in the last 8760 hours. No results for input(s): AMMONIA in the last 8760 hours. CBC: Recent Labs    07/16/18  WBC 5.0  HGB 11.8*  HCT 34*  PLT 194   Lipid Panel: No results for input(s): CHOL, HDL, LDLCALC, TRIG, CHOLHDL, LDLDIRECT in the last 8760 hours. No results found for: HGBA1C  Procedures since last visit: No results found.  Assessment/Plan Pleuritic chest pain Stat Chest Xray to rule out Pneumonia Meloxicam 15 mg QD for 5 days to help with pain Already on Oxycodone and Fentanyl Patch CBC and CMP in the morning  Addendum Chest Xray showed LL infilterate Will start on Levaquin 500mg  QD for 7 days with [place] Follow up tomorrow to see if her pain is better Essential hypertension Controlled On Hydralazine,Norvasc,Clonidine and Ace inhibitor Cannot tolerate Diuretic due to Hyponatremia  Other issues are stable  Hypothyroidism, unspecified type - Plan:  TSH normal in 02/20 Generalized arthritis with chronic knee pain Continue on oxycodone She wants to try Rubbing Cream on her Legs Biofreeze BID  LE Edema Better on Reduced dose of Norvasc Cannot use diuretics She does not like Ted hoses  CKD (chronic kidney disease) stage 3, GFR 30-59 ml/min ([facility]) - Plan:  BUN and creatinine are stable  Chronic Hyponatremia - Plan:  BMP today showed sodium of 131 Continue sodium tablets  Chronic pain syndrome - Plan:  We will continue on oxycodone and Duragesic patch  Depression with anxiety - Plan:  On high-dose of Remeron  Anemia of chronic disease - Plan:  Hgb Stable  ACP Comfort  care    Labs/tests ordered:  * No order type specified * Next appt:  Visit date not found Total time spent in this patient care encounter was  45_  minutes; greater than 50% of the visit spent counseling patient and staff, reviewing records , Labs and coordinating care for problems addressed at this encounter.

## 2019-03-21 ENCOUNTER — Non-Acute Institutional Stay: Payer: Medicare Other | Admitting: Nurse Practitioner

## 2019-03-21 ENCOUNTER — Encounter: Payer: Self-pay | Admitting: Nurse Practitioner

## 2019-03-21 DIAGNOSIS — G894 Chronic pain syndrome: Secondary | ICD-10-CM | POA: Diagnosis not present

## 2019-03-21 DIAGNOSIS — I10 Essential (primary) hypertension: Secondary | ICD-10-CM

## 2019-03-21 DIAGNOSIS — Z20828 Contact with and (suspected) exposure to other viral communicable diseases: Secondary | ICD-10-CM | POA: Diagnosis not present

## 2019-03-21 DIAGNOSIS — R0781 Pleurodynia: Secondary | ICD-10-CM | POA: Diagnosis not present

## 2019-03-21 DIAGNOSIS — N183 Chronic kidney disease, stage 3 unspecified: Secondary | ICD-10-CM

## 2019-03-21 DIAGNOSIS — F0390 Unspecified dementia without behavioral disturbance: Secondary | ICD-10-CM | POA: Diagnosis not present

## 2019-03-21 DIAGNOSIS — J189 Pneumonia, unspecified organism: Secondary | ICD-10-CM | POA: Diagnosis not present

## 2019-03-21 DIAGNOSIS — D649 Anemia, unspecified: Secondary | ICD-10-CM | POA: Diagnosis not present

## 2019-03-21 NOTE — Assessment & Plan Note (Signed)
Pain in the left lower chest > R is improved from yesterday. Continue  Meloxicam 15mg  qd x5 days started 03/20/19, continue Oxycodone 10mg  q8h prn,, Percocet 10/325 qid, Fentanyl 68mcg/hr, Tylenol 500mg  qd/1000mg  qd

## 2019-03-21 NOTE — Progress Notes (Signed)
Location:   [facility] Room Number: 72 Place of Service:  [facility] (13) Provider:  ,  NP  Virgie Dad, MD  Patient Care Team: Virgie Dad, MD as PCP - General (Internal Medicine) ,  X, NP as Nurse Practitioner (Internal Medicine)  Extended Emergency Contact Information Primary Emergency Contact: Gloria,[name] Address: [address] [place], [place] [address] [place] Mobile Phone: [phone] Relation: Son  Code Status:  DNR Goals of care: Advanced Directive information Advanced Directives 12/20/2018  Does Patient Have a Medical Advance Directive? Yes  Type of Advance Directive Living will;Healthcare Power of [place];Out of facility DNR (pink MOST or yellow form)  Does patient want to make changes to medical advance directive? No - Patient declined  Copy of [place] in Chart? Yes - validated most recent copy scanned in chart (See row information)  Would patient like information on creating a medical advance directive? -  Pre-existing out of facility DNR order (yellow form or pink MOST form) Yellow form placed in chart (order not valid for inpatient use);Pink MOST form placed in chart (order not valid for inpatient use)     Chief Complaint  Patient presents with  . Acute Visit    Pneumonia     HPI:  Pt is a 85 y.o. female seen today for an acute visit for pneumonia, CXR 03/20/19 showed left lung infiltrate, started Levaquin 500mg  qd x 7days. Non productive cough didn't interfere night sleep, the patient declined antitussives.   Left pleuritic chest pain, seems better since Meloxicam 15mg  qd x5 days started 03/20/19, on Oxycodone 10mg  q8h prn,, Percocet 10/325 qid, Fentanyl 4mcg/hr, Tylenol 500mg  qd/1000mg  qd for chronic pain syndrome. Pending CBC/CMP  HTN, rechecked 153/86. On Hydralazine 50mg  bid, 100mg  qd, Amlodipine 5mg  qd, Clonidine 0.1mg  qd,  Irbesartan 300mg  qd,   CKD, pending CMP   Past  Medical History:  Diagnosis Date  . Acquired cyst of kidney    left  . Allergic rhinitis, cause unspecified   . Anemia, unspecified   . Chronic hyponatremia 09/05/2015  . CKD stage 2 due to type 2 diabetes mellitus ([place]) 11/18/2014  . Closed fracture of unspecified part of vertebral column without mention of spinal cord injury    T7 s/p kyphoplasty, T12  . Edema 2015  . Herpes simplex without mention of complication   . History of Epstein-Barr virus infection   . Hyposmolality and/or hyponatremia    07/13/15 Na 131  . Insomnia   . Insomnia, unspecified   . Left cavernous carotid aneurysm 08/2008   1-1/93mm aneurysm of cavernous carotid artery  . Malignant neoplasm of breast (female), unspecified site 1990   left. Had surgerey and chemo, but no radiation  . 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 Neuropathy   . Osteoarthrosis of knee    bilaterally  . Osteoarthrosis, hip   . Osteoarthrosis, unspecified whether generalized or localized, forearm    bilaterally wrist  . Other and unspecified nonspecific immunological findings    positive ANA  . Other B-complex deficiencies   . Pain in joint, site unspecified    severe, diffuse, chronic pain  . Positive ANA (antinuclear antibody)   . Pulmonary embolism ([place]) 05/07/2015  . Pure hypercholesterolemia   . Senile osteoporosis   . Unspecified essential hypertension   . Unspecified gastritis and gastroduodenitis without mention of hemorrhage   .  Unspecified hypothyroidism   . Unspecified vitamin D deficiency    Past Surgical History:  Procedure Laterality Date  . BREAST SURGERY Left   . DILATATION & CURETTAGE/HYSTEROSCOPY WITH MYOSURE N/A 06/27/2017   Procedure: DILATATION & CURETTAGE/HYSTEROSCOPY WITH MYOSURE;  Surgeon: Servando Salina, MD;  Location: [facility];  Service: Gynecology;  Laterality: N/A;  . DILATION AND CURETTAGE OF UTERUS  X 2  . FEMUR IM NAIL Right 03/18/2015   Procedure:  INTRAMEDULLARY (IM) NAIL FEMORAL;  Surgeon: Rod Can, MD;  Location: [facility];  Service: Orthopedics;  Laterality: Right;  . FRACTURE SURGERY     hip  . KYPHOPLASTY  07/03/2010   T12  . KYPHOPLASTY     C7  . LAPAROSCOPIC CHOLECYSTECTOMY  09/20/2006  . MASTECTOMY Left 04/29/1989  . MIDDLE EAR SURGERY Bilateral 1938  . NASAL SINUS SURGERY  1990 & 2000  . ORIF HIP FRACTURE Left 06/13/2007   following a syncopal episode  . TONSILLECTOMY  1968    Allergies  Allergen Reactions  . Aspirin     Drop in body temp with large quantities, can tolerate low doses of aspirin  . Hctz [Hydrochlorothiazide] Other (See Comments)    Hyponatremia  . Penicillins Swelling    Has patient had a PCN reaction causing immediate rash, facial/tongue/throat swelling, SOB or lightheadedness with hypotension: No Has patient had a PCN reaction causing severe rash involving mucus membranes or skin necrosis: No Has patient had a PCN reaction that required hospitalization No Has patient had a PCN reaction occurring within the last 10 years: No If all of the above answers are "NO", then may proceed with Cephalosporin use.    Allergies as of 03/21/2019      Reactions   Aspirin    Drop in body temp with large quantities, can tolerate low doses of aspirin   Hctz [hydrochlorothiazide] Other (See Comments)   Hyponatremia   Penicillins Swelling   Has patient had a PCN reaction causing immediate rash, facial/tongue/throat swelling, SOB or lightheadedness with hypotension: No Has patient had a PCN reaction causing severe rash involving mucus membranes or skin necrosis: No Has patient had a PCN reaction that required hospitalization No Has patient had a PCN reaction occurring within the last 10 years: No If all of the above answers are "NO", then may proceed with Cephalosporin use.      Medication List       Accurate as of March 21, 2019 11:25 AM. If you have any questions, ask your nurse or doctor.         acetaminophen 500 MG tablet Commonly known as: TYLENOL Take 1,000 mg by mouth daily.   acetaminophen 500 MG tablet Commonly known as: TYLENOL Take 500 mg by mouth every morning.   amLODipine 5 MG tablet Commonly known as: NORVASC Take 5 mg by mouth daily.   Biofreeze 4 % Gel Generic drug: Menthol (Topical Analgesic) Apply 1 application topically 3 (three) times daily as needed. Knee and hand pain   cloNIDine 0.1 MG tablet Commonly known as: CATAPRES Take 0.1 mg by mouth daily.   famotidine 10 MG tablet Commonly known as: PEPCID Take 10 mg by mouth at bedtime.   feeding supplement Liqd Take 1 Container by mouth 2 (two) times daily between meals. (1000 & 1600) Prefers Chocolate   fentaNYL 12 MCG/HR Commonly known as: [place] 1 patch onto the skin every 3 (three) days.   hydrALAZINE 100 MG tablet Commonly known as: APRESOLINE Take 100 mg by mouth daily.  If SBP >160 recheck B/P and document in vital sign record.   hydrALAZINE 50 MG tablet Commonly known as: APRESOLINE Take 50 mg by mouth 2 (two) times daily.   ipratropium 0.06 % nasal spray Commonly known as: ATROVENT Place 2 sprays into both nostrils 2 (two) times daily as needed for rhinitis. PRN-NASAL DRAINAGE   irbesartan 300 MG tablet Commonly known as: AVAPRO Take 300 mg by mouth daily. HOLD for B/P [id number], If systolic B/P is over 0000000, recheck B/P, document in Vital Signs.   levofloxacin 500 MG tablet Commonly known as: LEVAQUIN Take 500 mg by mouth daily. For 7 days   levothyroxine 150 MCG tablet Commonly known as: SYNTHROID Take 150 mcg by mouth daily before breakfast. (0730)   Lidocaine 4 % Ptch Apply topically. Apply one patch topically to left hip every morning and remove every evening; resident may self apply; keep on medcart Twice A Day What changed: Another medication with the same name was removed. Continue taking this medication, and follow the directions you see here. Changed by:  X  , NP   Lidocaine 4 % Ptch Apply topically. Apply one patch topically to right shoulder every morning and remove every evening; resident may self apply; keep on med cart Twice A Day What changed: Another medication with the same name was removed. Continue taking this medication, and follow the directions you see here. Changed by:  X , NP   Lidocaine 4 % Ptch Apply topically. Apply one patch topically to left knee every morning and remove every evening; resident may self apply; keep on med cart Twice A Day What changed: Another medication with the same name was removed. Continue taking this medication, and follow the directions you see here. Changed by:  X , NP   loratadine 10 MG tablet Commonly known as: CLARITIN Take 1 tablet (10 mg total) by mouth daily.   meloxicam 15 MG tablet Commonly known as: MOBIC Take 15 mg by mouth daily.   mirtazapine 30 MG tablet Commonly known as: REMERON Take 30 mg by mouth at bedtime.   ondansetron 4 MG tablet Commonly known as: ZOFRAN Take 4 mg by mouth every 8 (eight) hours as needed for nausea or vomiting.   Oxycodone HCl 10 MG Tabs Take 10 mg by mouth every 8 (eight) hours as needed.   oxyCODONE-acetaminophen 10-325 MG tablet Commonly known as: PERCOCET Take one tablet by mouth four times daily 6 am, 12 am, 6 pm, and 12 pm for pain   pantoprazole 40 MG tablet Commonly known as: PROTONIX Take 40 mg by mouth daily.   polyethylene glycol 17 g packet Commonly known as: MIRALAX / GLYCOLAX Take 17 g by mouth daily.   promethazine 25 MG suppository Commonly known as: PHENERGAN Place 25 mg rectally every 6 (six) hours as needed for nausea or vomiting. nausea WITH vomiting x 24 hours. Clear liquid diet x24 hours. If NOT effective, notify MD.   psyllium 58.6 % powder Commonly known as: METAMUCIL Take 1 packet by mouth daily. Mix in 6 oz of water or juice   saccharomyces boulardii 250 MG capsule Commonly known as:  FLORASTOR Take 250 mg by mouth 2 (two) times daily.   sennosides-docusate sodium 8.6-50 MG tablet Commonly known as: SENOKOT-S Take 1 tablet by mouth daily. Hold for loose stool   Shingrix injection Generic drug: Zoster Vaccine Adjuvanted Inject 0.5 mLs into the muscle once. Shingrix 0.5 ml IM x 1 repeated in 6 months Once - One Time on  09/09/2019   sodium chloride 1 g tablet Take 1 g by mouth 2 (two) times daily.   UNABLE TO FIND Med Name: Hydrocolloid dressing 4x4. Change dressing to the coccyx as needed. Check patency daily   zinc oxide 20 % ointment Apply 1 application topically 3 (three) times daily as needed for irritation. Apply to buttocks for redness/dark pink/excoriation       Review of Systems  Constitutional: Negative for activity change, appetite change, chills, diaphoresis, fatigue and fever.  HENT: Positive for hearing loss. Negative for congestion and voice change.   Respiratory: Positive for cough. Negative for shortness of breath and wheezing.   Cardiovascular: Positive for chest pain and leg swelling. Negative for palpitations.       R+L lower chest pain with coughing.   Gastrointestinal: Negative for abdominal distention, abdominal pain, constipation, diarrhea, nausea and vomiting.  Genitourinary: Negative for difficulty urinating, dysuria and urgency.  Musculoskeletal: Positive for back pain and gait problem.  Skin: Negative for color change and pallor.  Neurological: Negative for dizziness, speech difficulty, weakness and headaches.  Psychiatric/Behavioral: Negative for agitation, behavioral problems, hallucinations and sleep disturbance. The patient is not nervous/anxious.     Immunization History  Administered Date(s) Administered  . Influenza Split 02/13/2013  . Influenza-Unspecified 02/27/2014, 02/12/2015, 02/25/2016, 03/08/2017, 02/19/2018  . PPD Test 09/09/2015  . Pneumococcal Conjugate-13 05/05/2016  . Pneumococcal Polysaccharide-23 02/13/2013   . Td 05/05/2016  . Zoster 05/04/2016   Pertinent  Health Maintenance Due  Topic Date Due  . MAMMOGRAM  03/21/1945  . INFLUENZA VACCINE  Completed  . DEXA SCAN  Completed  . PNA vac Low Risk Adult  Completed   Fall Risk  01/08/2018 01/02/2017 11/10/2015 10/20/2015 10/20/2015  Falls in the past year? No No No Yes No  Number falls in past yr: - - - 2 or more -  Injury with Fall? - - - Yes -  Risk Factor Category  - - - - -  Risk for fall due to : - - - History of fall(s);Impaired balance/gait -  Follow up - - - - -   Functional Status Survey:    Vitals:   03/21/19 1021  BP: (!) 175/95  Pulse: 70  Resp: 17  Temp: (!) 97.5 F (36.4 C)  SpO2: 97%  Weight: 104 lb 8 oz (47.4 kg)  Height: 5\' 8"  (1.727 m)   Body mass index is 15.89 kg/m. Physical Exam Vitals signs and nursing note reviewed.  Constitutional:      General: She is not in acute distress.    Appearance: Normal appearance. She is not ill-appearing, toxic-appearing or diaphoretic.  HENT:     Head: Normocephalic and atraumatic.     Nose: Nose normal.     Mouth/Throat:     Mouth: Mucous membranes are moist.  Eyes:     Extraocular Movements: Extraocular movements intact.     Conjunctiva/sclera: Conjunctivae normal.     Pupils: Pupils are equal, round, and reactive to light.  Neck:     Musculoskeletal: Normal range of motion and neck supple.  Cardiovascular:     Rate and Rhythm: Normal rate and regular rhythm.  Pulmonary:     Breath sounds: Rales present. No wheezing.     Comments: R+L lower chest pain with coughing, L>R, better than yesterday. Rales appreciated posterior left mid to base lung   Chest:     Chest wall: Tenderness present.  Abdominal:     General: Bowel sounds are normal. There is no  distension.     Palpations: Abdomen is soft.     Tenderness: There is no abdominal tenderness. There is no right CVA tenderness, left CVA tenderness, guarding or rebound.  Musculoskeletal:     Right lower leg: Edema  present.     Left lower leg: Edema present.     Comments: Trace edema BLE  Skin:    General: Skin is warm and dry.  Neurological:     General: No focal deficit present.     Mental Status: She is alert and oriented to person, place, and time. Mental status is at baseline.  Psychiatric:        Mood and Affect: Mood normal.        Behavior: Behavior normal.        Thought Content: Thought content normal.        Judgment: Judgment normal.     Labs reviewed: Recent Labs    07/12/18  09/17/18 12/04/18 12/20/18  NA 127*   < > 130* 131* 131*  K 4.3   < > 3.8 3.7 3.7  CL 94  --   --   --  98  CO2 24  --   --   --  27  BUN 25*   < > 16 14 18   CREATININE 1.1   < > 0.9 0.9 1.0  CALCIUM 9.3  --   --   --  8.2   < > = values in this interval not displayed.   Recent Labs    12/04/18  AST 16  ALT 7   Recent Labs    07/16/18  WBC 5.0  HGB 11.8*  HCT 34*  PLT 194   Lab Results  Component Value Date   TSH 2.73 07/16/2018   No results found for: HGBA1C Lab Results  Component Value Date   CHOL 175 11/10/2014   HDL 53 11/10/2014   LDLCALC 87 11/10/2014   TRIG 173 (A) 11/10/2014    Significant Diagnostic Results in last 30 days:  No results found.  Assessment/Plan CKD (chronic kidney disease) stage 3, GFR 30-59 ml/min (HCC) Pending CMP  Pneumonia involving left lung 03/20/19 CXR mild pulmonary infiltrate and plate like atelectasis in the left lung base consistent with pneumonia 03/21/19 cough, left lower chest pain with cough x 2 days, COVID test pending, crackles posterior left mid-base, Levaquin 500mg  qd x 1 03/20/19, Pharm: recommended to reduce Levaquin 250mg  qd for the rest of remaining 6 days due to renal dose adjustment. The patient stated she slept well last night, declined antitussives. Pending CBC, CMP   Pleuritic pain Pain in the left lower chest > R is improved from yesterday. Continue  Meloxicam 15mg  qd x5 days started 03/20/19, continue Oxycodone 10mg  q8h prn,,  Percocet 10/325 qid, Fentanyl 4mcg/hr, Tylenol 500mg  qd/1000mg  qd   Chronic pain syndrome  Continue Meloxicam 15mg  qd x5 days started 03/20/19, Oxycodone 10mg  q8h prn,, Percocet 10/325 qid, Fentanyl 27mcg/hr, Tylenol 500mg  qd/1000mg  qd   Essential hypertension rechecked BP 153/86. Continue  Hydralazine 50mg  bid, 100mg  qd, Amlodipine 5mg  qd, Clonidine 0.1mg  qd,  Irbesartan 300mg  qd,      Family/ staff Communication: plan of care reviewed with the patient and charge   Labs/tests ordered:  Pending CBC CMP  Time spend 40 minutes.

## 2019-03-21 NOTE — Assessment & Plan Note (Signed)
Continue Meloxicam 15mg  qd x5 days started 03/20/19, Oxycodone 10mg  q8h prn,, Percocet 10/325 qid, Fentanyl 6mcg/hr, Tylenol 500mg  qd/1000mg  qd

## 2019-03-21 NOTE — Assessment & Plan Note (Signed)
rechecked BP 153/86. Continue  Hydralazine 50mg  bid, 100mg  qd, Amlodipine 5mg  qd, Clonidine 0.1mg  qd,  Irbesartan 300mg  qd,

## 2019-03-21 NOTE — Assessment & Plan Note (Addendum)
03/20/19 CXR mild pulmonary infiltrate and plate like atelectasis in the left lung base consistent with pneumonia 03/21/19 cough, left lower chest pain with cough x 2 days, COVID test pending, crackles posterior left mid-base, Levaquin 500mg  qd x 1 03/20/19, Pharm: recommended to reduce Levaquin 250mg  qd for the rest of remaining 6 days due to renal dose adjustment. The patient stated she slept well last night, declined antitussives. Pending CBC, CMP

## 2019-03-21 NOTE — Assessment & Plan Note (Addendum)
Pending CMP. 

## 2019-03-28 DIAGNOSIS — I10 Essential (primary) hypertension: Secondary | ICD-10-CM | POA: Diagnosis not present

## 2019-04-01 ENCOUNTER — Other Ambulatory Visit: Payer: Self-pay | Admitting: *Deleted

## 2019-04-01 MED ORDER — FENTANYL 12 MCG/HR TD PT72: 1.0000 | MEDICATED_PATCH | TRANSDERMAL | 0 refills | Status: DC

## 2019-04-01 NOTE — Telephone Encounter (Signed)
Received fax refill Request from [facility] °Pended Rx and sent to Dr. Gupta for approval.  °

## 2019-04-05 ENCOUNTER — Encounter: Payer: Self-pay | Admitting: Nurse Practitioner

## 2019-04-05 ENCOUNTER — Non-Acute Institutional Stay: Payer: Medicare Other | Admitting: Nurse Practitioner

## 2019-04-05 DIAGNOSIS — J189 Pneumonia, unspecified organism: Secondary | ICD-10-CM | POA: Diagnosis not present

## 2019-04-05 DIAGNOSIS — K5909 Other constipation: Secondary | ICD-10-CM

## 2019-04-05 DIAGNOSIS — M1990 Unspecified osteoarthritis, unspecified site: Secondary | ICD-10-CM

## 2019-04-05 DIAGNOSIS — I10 Essential (primary) hypertension: Secondary | ICD-10-CM | POA: Diagnosis not present

## 2019-04-05 DIAGNOSIS — K219 Gastro-esophageal reflux disease without esophagitis: Secondary | ICD-10-CM

## 2019-04-05 DIAGNOSIS — R609 Edema, unspecified: Secondary | ICD-10-CM | POA: Diagnosis not present

## 2019-04-05 DIAGNOSIS — F331 Major depressive disorder, recurrent, moderate: Secondary | ICD-10-CM | POA: Diagnosis not present

## 2019-04-05 DIAGNOSIS — R0781 Pleurodynia: Secondary | ICD-10-CM

## 2019-04-05 DIAGNOSIS — E039 Hypothyroidism, unspecified: Secondary | ICD-10-CM | POA: Diagnosis not present

## 2019-04-05 DIAGNOSIS — R05 Cough: Secondary | ICD-10-CM | POA: Diagnosis not present

## 2019-04-05 DIAGNOSIS — E871 Hypo-osmolality and hyponatremia: Secondary | ICD-10-CM

## 2019-04-05 NOTE — Progress Notes (Signed)
Location:   [facility] Room Number: 67 Place of Service:  [facility] (13) Provider:  Shaketta Rill NP  Virgie Dad, MD  Patient Care Team: Virgie Dad, MD as PCP - General (Internal Medicine) Jaylon Boylen X, NP as Nurse Practitioner (Internal Medicine)  Extended Emergency Contact Information Primary Emergency Contact: Katt,[name] Address: [address] [place], [place] [address] [place] Mobile Phone: [phone] Relation: Son  Code Status:  DNR Goals of care: Advanced Directive information Advanced Directives 12/20/2018  Does Patient Have a Medical Advance Directive? Yes  Type of Advance Directive Living will;Healthcare Power of [place];Out of facility DNR (pink MOST or yellow form)  Does patient want to make changes to medical advance directive? No - Patient declined  Copy of [place] in Chart? Yes - validated most recent copy scanned in chart (See row information)  Would patient like information on creating a medical advance directive? -  Pre-existing out of facility DNR order (yellow form or pink MOST form) Yellow form placed in chart (order not valid for inpatient use);Pink MOST form placed in chart (order not valid for inpatient use)     Chief Complaint  Patient presents with  . Medical Management of Chronic Issues  . Health Maintenance    Mammogram    HPI:  Pt is a 85 y.o. female seen today for medical management of chronic diseases.    The patient resides in [place] for safety, care assistance. Hx of Chronic edema BLE, not tolerating diuretic well, contributing to hyponatremia, last serum Na was 127 03/28/19. She was c/o recurrent pleural chest pain R+L, last occurrence 03/20/19, treated with 5 day course of Meloxicam 69m qd, LLL infiltrate with 7 day course of Levaquin 5044mqd. Her goal of care is comfort measures. No constipation, on Senokot S I qd, Psyllium 1 pk a day, MiraLax qd. GERD stable on Pantoprazole  4066md, Famotidine 104m66m. Chronic pain syndrome/arhtritis, on Oxycodone 10/325mg34m, Oxycodone 104mg 64mprn, Fentanyl 12mcg/28mTylenol 1000mg qd53mr mood is stable, on Mirtazapine 30mg qd.62mothyroidism, on Levothyroxine 150mcg qd,66mt TSH 1.78 01/24/19. HTN, blood pressure is controlled on Irbesartan 300mg qd, H19mlazine 50mg bid, 177m qd, Clo11mne 0.1mg qd, Amlod74mne 5mg qd.    Pas57medical History:  Diagnosis Date  . Acquired cyst of kidney    left  . Allergic rhinitis, cause unspecified   . Anemia, unspecified   . Chronic hyponatremia 09/05/2015  . CKD stage 2 due to type 2 diabetes mellitus (HCC) 11/18/2014  [place] fracture of unspecified part of vertebral column without mention of spinal cord injury    T7 s/p kyphoplasty, T12  . Edema 2015  . Herpes simplex without mention of complication   . History of Epstein-Barr virus infection   . Hyposmolality and/or hyponatremia    07/13/15 Na 131  . Insomnia   . Insomnia, unspecified   . Left cavernous carotid aneurysm 08/2008   1-1/2mm aneurysm of37mvernous carotid artery  . Malignant neoplasm of breast (female), unspecified site 1990   left. Had surgerey and chemo, but no radiation  . 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 Neuropathy   . Osteoarthrosis of knee    bilaterally  . Osteoarthrosis, hip   . Osteoarthrosis, unspecified whether generalized or localized, forearm    bilaterally wrist  . Other and unspecified nonspecific  immunological findings    positive ANA  . Other B-complex deficiencies   . Pain in joint, site unspecified    severe, diffuse, chronic pain  . Positive ANA (antinuclear antibody)   . Pulmonary embolism ([facility]) 05/07/2015  . Pure hypercholesterolemia   . Senile osteoporosis   . Unspecified essential hypertension   . Unspecified gastritis and gastroduodenitis without mention of hemorrhage   . Unspecified hypothyroidism   . Unspecified vitamin D  deficiency    Past Surgical History:  Procedure Laterality Date  . BREAST SURGERY Left   . DILATATION & CURETTAGE/HYSTEROSCOPY WITH MYOSURE N/A 06/27/2017   Procedure: DILATATION & CURETTAGE/HYSTEROSCOPY WITH MYOSURE;  Surgeon: Servando Salina, MD;  Location: [facility];  Service: Gynecology;  Laterality: N/A;  . DILATION AND CURETTAGE OF UTERUS  X 2  . FEMUR IM NAIL Right 03/18/2015   Procedure: INTRAMEDULLARY (IM) NAIL FEMORAL;  Surgeon: Rod Can, MD;  Location: [facility];  Service: Orthopedics;  Laterality: Right;  . FRACTURE SURGERY     hip  . KYPHOPLASTY  07/03/2010   T12  . KYPHOPLASTY     C7  . LAPAROSCOPIC CHOLECYSTECTOMY  09/20/2006  . MASTECTOMY Left 04/29/1989  . MIDDLE EAR SURGERY Bilateral 1938  . NASAL SINUS SURGERY  1990 & 2000  . ORIF HIP FRACTURE Left 06/13/2007   following a syncopal episode  . TONSILLECTOMY  1968    Allergies  Allergen Reactions  . Aspirin     Drop in body temp with large quantities, can tolerate low doses of aspirin  . Hctz [Hydrochlorothiazide] Other (See Comments)    Hyponatremia  . Penicillins Swelling    Has patient had a PCN reaction causing immediate rash, facial/tongue/throat swelling, SOB or lightheadedness with hypotension: No Has patient had a PCN reaction causing severe rash involving mucus membranes or skin necrosis: No Has patient had a PCN reaction that required hospitalization No Has patient had a PCN reaction occurring within the last 10 years: No If all of the above answers are "NO", then may proceed with Cephalosporin use.    Allergies as of 04/05/2019      Reactions   Aspirin    Drop in body temp with large quantities, can tolerate low doses of aspirin   Hctz [hydrochlorothiazide] Other (See Comments)   Hyponatremia   Penicillins Swelling   Has patient had a PCN reaction causing immediate rash, facial/tongue/throat swelling, SOB or lightheadedness with hypotension: No Has patient had a PCN reaction causing severe rash  involving mucus membranes or skin necrosis: No Has patient had a PCN reaction that required hospitalization No Has patient had a PCN reaction occurring within the last 10 years: No If all of the above answers are "NO", then may proceed with Cephalosporin use.      Medication List       Accurate as of April 05, 2019 11:59 PM. If you have any questions, ask your nurse or doctor.        STOP taking these medications   saccharomyces boulardii 250 MG capsule Commonly known as: FLORASTOR Stopped by: Ervie Mccard X Rivaldo Hineman, NP     TAKE these medications   acetaminophen 500 MG tablet Commonly known as: TYLENOL Take 1,000 mg by mouth daily.   acetaminophen 500 MG tablet Commonly known as: TYLENOL Take 500 mg by mouth every morning.   amLODipine 5 MG tablet Commonly known as: NORVASC Take 5 mg by mouth daily.   Biofreeze 4 % Gel Generic drug: Menthol (Topical Analgesic) Apply 1 application topically  3 (three) times daily as needed. Knee and hand pain   cloNIDine 0.1 MG tablet Commonly known as: CATAPRES Take 0.1 mg by mouth daily.   famotidine 10 MG tablet Commonly known as: PEPCID Take 10 mg by mouth at bedtime.   feeding supplement Liqd Take 1 Container by mouth 2 (two) times daily between meals. (1000 & 1600) Prefers Chocolate   fentaNYL 12 MCG/HR Commonly known as: [place] 1 patch onto the skin every 3 (three) days.   hydrALAZINE 100 MG tablet Commonly known as: APRESOLINE Take 100 mg by mouth daily. If SBP >160 recheck B/P and document in vital sign record.   hydrALAZINE 50 MG tablet Commonly known as: APRESOLINE Take 50 mg by mouth 2 (two) times daily.   ipratropium 0.06 % nasal spray Commonly known as: ATROVENT Place 2 sprays into both nostrils 2 (two) times daily as needed for rhinitis. PRN-NASAL DRAINAGE   irbesartan 300 MG tablet Commonly known as: AVAPRO Take 300 mg by mouth daily. HOLD for B/P <75/64, If systolic B/P is over 332, recheck B/P, document  in Vital Signs.   levothyroxine 150 MCG tablet Commonly known as: SYNTHROID Take 150 mcg by mouth daily before breakfast. (0730)   Lidocaine 4 % Ptch Apply topically. Apply one patch topically to left hip every morning and remove every evening; resident may self apply; keep on medcart Twice A Day   Lidocaine 4 % Ptch Apply topically. Apply one patch topically to right shoulder every morning and remove every evening; resident may self apply; keep on med cart Twice A Day   Lidocaine 4 % Ptch Apply topically. Apply one patch topically to left knee every morning and remove every evening; resident may self apply; keep on med cart Twice A Day   loratadine 10 MG tablet Commonly known as: CLARITIN Take 1 tablet (10 mg total) by mouth daily.   meloxicam 15 MG tablet Commonly known as: MOBIC Take 15 mg by mouth daily.   mirtazapine 30 MG tablet Commonly known as: REMERON Take 30 mg by mouth at bedtime.   ondansetron 4 MG tablet Commonly known as: ZOFRAN Take 4 mg by mouth every 8 (eight) hours as needed for nausea or vomiting.   Oxycodone HCl 10 MG Tabs Take 10 mg by mouth every 8 (eight) hours as needed.   oxyCODONE-acetaminophen 10-325 MG tablet Commonly known as: PERCOCET Take one tablet by mouth four times daily 6 am, 12 am, 6 pm, and 12 pm for pain   pantoprazole 40 MG tablet Commonly known as: PROTONIX Take 40 mg by mouth daily.   polyethylene glycol 17 g packet Commonly known as: MIRALAX / GLYCOLAX Take 17 g by mouth daily.   promethazine 25 MG suppository Commonly known as: PHENERGAN Place 25 mg rectally every 6 (six) hours as needed for nausea or vomiting. nausea WITH vomiting x 24 hours. Clear liquid diet x24 hours. If NOT effective, notify MD.   psyllium 58.6 % powder Commonly known as: METAMUCIL Take 1 packet by mouth daily. Mix in 6 oz of water or juice   sennosides-docusate sodium 8.6-50 MG tablet Commonly known as: SENOKOT-S Take 1 tablet by mouth  daily. Hold for loose stool   Shingrix injection Generic drug: Zoster Vaccine Adjuvanted Inject 0.5 mLs into the muscle once. Shingrix 0.5 ml IM x 1 repeated in 6 months Once - One Time on 09/09/2019   sodium chloride 1 g tablet Take 1 g by mouth 2 (two) times daily.   UNABLE TO  FIND Med Name: Hydrocolloid dressing 4x4. Change dressing to the coccyx as needed. Check patency daily   zinc oxide 20 % ointment Apply 1 application topically 3 (three) times daily as needed for irritation. Apply to buttocks for redness/dark pink/excoriation       Review of Systems  Constitutional: Positive for activity change, appetite change and fatigue. Negative for chills, diaphoresis and fever.  HENT: Positive for hearing loss. Negative for congestion and voice change.   Eyes: Negative for visual disturbance.  Respiratory: Positive for cough and shortness of breath. Negative for wheezing.        DOE  Cardiovascular: Positive for chest pain and leg swelling. Negative for palpitations.       R+L lower rib cage wall pain, worsened with movement, deep breathing, cough  Gastrointestinal: Negative for abdominal distention, abdominal pain, constipation, diarrhea, nausea and vomiting.  Genitourinary: Negative for difficulty urinating, dysuria, frequency and urgency.  Musculoskeletal: Positive for arthralgias, back pain and gait problem.  Skin: Negative for color change and pallor.  Neurological: Negative for dizziness, speech difficulty, weakness and headaches.  Psychiatric/Behavioral: Positive for dysphoric mood. Negative for agitation, behavioral problems, confusion, hallucinations and sleep disturbance. The patient is nervous/anxious.     Immunization History  Administered Date(s) Administered  . Influenza Split 02/13/2013  . Influenza-Unspecified 02/27/2014, 02/12/2015, 02/25/2016, 03/08/2017, 02/19/2018  . PPD Test 09/09/2015  . Pneumococcal Conjugate-13 05/05/2016  . Pneumococcal Polysaccharide-23  02/13/2013  . Td 05/05/2016  . Zoster 05/04/2016   Pertinent  Health Maintenance Due  Topic Date Due  . MAMMOGRAM  03/21/1945  . INFLUENZA VACCINE  Completed  . DEXA SCAN  Completed  . PNA vac Low Risk Adult  Completed   Fall Risk  01/08/2018 01/02/2017 11/10/2015 10/20/2015 10/20/2015  Falls in the past year? No No No Yes No  Number falls in past yr: - - - 2 or more -  Injury with Fall? - - - Yes -  Risk Factor Category  - - - - -  Risk for fall due to : - - - History of fall(s);Impaired balance/gait -  Follow up - - - - -   Functional Status Survey:    Vitals:   04/05/19 1509  BP: (!) 152/90  Pulse: 71  Resp: 20  Temp: 98.7 F (37.1 C)  SpO2: 97%  Weight: 104 lb 8 oz (47.4 kg)  Height: _0  (1.727 m)   Body mass index is 15.89 kg/m. Physical Exam Vitals signs and nursing note reviewed.  Constitutional:      General: She is not in acute distress.    Appearance: Normal appearance. She is ill-appearing. She is not toxic-appearing or diaphoretic.  HENT:     Head: Normocephalic and atraumatic.     Nose: Nose normal.     Mouth/Throat:     Mouth: Mucous membranes are moist.  Eyes:     Extraocular Movements: Extraocular movements intact.     Conjunctiva/sclera: Conjunctivae normal.     Pupils: Pupils are equal, round, and reactive to light.  Neck:     Musculoskeletal: Normal range of motion and neck supple.  Cardiovascular:     Rate and Rhythm: Normal rate and regular rhythm.     Heart sounds: No murmur.  Pulmonary:     Effort: No respiratory distress.     Breath sounds: Rales present. No wheezing or rhonchi.     Comments: Bibasilar rales.  Abdominal:     General: Bowel sounds are normal. There is no distension.  Palpations: Abdomen is soft.     Tenderness: There is no abdominal tenderness. There is no right CVA tenderness, left CVA tenderness, guarding or rebound.  Musculoskeletal:     Right lower leg: Edema present.     Left lower leg: Edema present.      Comments: Trace edema BLE. C/o lower rib cage pain R+L worsened with movement, deep breathing, or cough.   Skin:    General: Skin is warm.  Neurological:     General: No focal deficit present.     Mental Status: She is alert and oriented to person, place, and time. Mental status is at baseline.     Cranial Nerves: No cranial nerve deficit.     Motor: No weakness.     Coordination: Coordination normal.     Gait: Gait abnormal.  Psychiatric:        Behavior: Behavior normal.     Comments: Anxious appearance.      Labs reviewed: Recent Labs    07/12/18  09/17/18 12/04/18 12/20/18  NA 127*   < > 130* 131* 131*  K 4.3   < > 3.8 3.7 3.7  CL 94  --   --   --  98  CO2 24  --   --   --  27  BUN 25*   < > _0 CREATININE 1.1   < > 0.9 0.9 1.0  CALCIUM 9.3  --   --   --  8.2   < > = values in this interval not displayed.   Recent Labs    12/04/18  AST 16  ALT 7   Recent Labs    07/16/18  WBC 5.0  HGB 11.8*  HCT 34*  PLT 194   Lab Results  Component Value Date   TSH 2.73 07/16/2018   No results found for: HGBA1C Lab Results  Component Value Date   CHOL 175 11/10/2014   HDL 53 11/10/2014   LDLCALC 87 11/10/2014   TRIG 173 (A) 11/10/2014    Significant Diagnostic Results in last 30 days:  No results found.  Assessment/Plan Chronic Hyponatremia 03/21/19 Na 129, K 4.3, Bun 25, creat 0.98, eGFR 51, TP 5.7, albumin 3.1, wbc 3.3, Hgb 10.4, plt 142, neutrophils 60.9               03/28/19 Na 127, K 4.1, Bun 21, creat 1.03, eGFR 48, BMP 2 weeks.                 Update BMP 04/08/19  Hypothyroidism Stable, lat TSH 1.78 01/24/19, continue Levothyroxine 128mg qd.   Essential hypertension Blood pressure is controlled, continue Irbesartan 3023mqd, Hydralazine 5065mid, 100m81m, Clonidine 0.1mg 50m Amlodipine 5mg q4m   GERD without esophagitis Stable, continue Pantoprazole 40mg q78mamotidine 10mg qd40mhronic constipation Stable, continue Senokot S I qd, Psyllium  1 pk a day, MiraLax qd  Generalized arthritis Pain is managed, continue Oxycodone 10/325mg qid77mycodone 10mg q8h 36m Fentanyl 12mcg/hr, 35mnol 1000mg qd.  P23mitic pain 04/05/19 pain in the lower rib cage R+L, not new, cough seems more than her usual, no O2 desat or SOB, son visited yesterday, precaution x 72hrs, Meloxicam 15mg qd x 5 27m with meal. CXR 2 views, CBC/diff, CMP/eGFR stat.  04/05/19 wbc 6.9, Hgb 11.0, plt 160, neutrophils 79.6, Na 128, K 4.0, Bun 18, creat 0.90, eGFR 56, pending CXR CXR reported pulmonary congestion + PNA, Furosemide 20mg qd Kcl 249m qd x3 da1m  Doxycycline 166m qd x 7 days.   Pneumonia CXR reported from staff showed pneumonia, Doxycycline 108mbid x 7 days started.   Recurrent major depression (HCSavageAnxious episodes, but in general she sleeps, eats at her baseline, continue Mirtazapine 3055md.   Edema Chronic, not tolerating diuretics well, contributing to hyponatremia, will Furosemide 50m68m, Kcl 50me36m x3 days in setting of pulmonary congestion per CXR, BMP 04/08/19     Family/ staff Communication: plan of care reviewed with the patient and charge nurse .  Labs/tests ordered:  CXR, CBC/diff, CMP/eGFR  Time spend 40 minutes.

## 2019-04-06 ENCOUNTER — Encounter: Payer: Self-pay | Admitting: Nurse Practitioner

## 2019-04-06 NOTE — Assessment & Plan Note (Signed)
Stable, continue Senokot S I qd, Psyllium 1 pk a day, MiraLax qd

## 2019-04-06 NOTE — Assessment & Plan Note (Signed)
Stable, continue Pantoprazole 40mg  qd, Famotidine 10mg  qd.

## 2019-04-06 NOTE — Assessment & Plan Note (Signed)
Chronic, not tolerating diuretics well, contributing to hyponatremia, will Furosemide 20mg  qd, Kcl 56meq qd x3 days in setting of pulmonary congestion per CXR, BMP 04/08/19

## 2019-04-06 NOTE — Assessment & Plan Note (Signed)
Blood pressure is controlled, continue Irbesartan 300mg  qd, Hydralazine 50mg  bid, 100mg  qd, Clonidine 0.1mg  qd, Amlodipine 5mg  qd.

## 2019-04-06 NOTE — Assessment & Plan Note (Signed)
Stable, lat TSH 1.78 01/24/19, continue Levothyroxine 139mcg qd.

## 2019-04-06 NOTE — Assessment & Plan Note (Signed)
CXR reported from staff showed pneumonia, Doxycycline 100mg  bid x 7 days started.

## 2019-04-06 NOTE — Assessment & Plan Note (Signed)
Pain is managed, continue Oxycodone 10/325mg  qid, Oxycodone 10mg  q8h prn, Fentanyl 74mcg/hr, Tylenol 1000mg  qd.

## 2019-04-06 NOTE — Assessment & Plan Note (Signed)
Anxious episodes, but in general she sleeps, eats at her baseline, continue Mirtazapine 30mg  qd.

## 2019-04-06 NOTE — Assessment & Plan Note (Signed)
03/21/19 Na 129, K 4.3, Bun 25, creat 0.98, eGFR 51, TP 5.7, albumin 3.1, wbc 3.3, Hgb 10.4, plt 142, neutrophils 60.9               03/28/19 Na 127, K 4.1, Bun 21, creat 1.03, eGFR 48, BMP 2 weeks.                 Update BMP 04/08/19

## 2019-04-06 NOTE — Assessment & Plan Note (Signed)
04/05/19 pain in the lower rib cage R+L, not new, cough seems more than her usual, no O2 desat or SOB, son visited yesterday, precaution x 72hrs, Meloxicam 55m qd x 5 days with meal. CXR 2 views, CBC/diff, CMP/eGFR stat.  04/05/19 wbc 6.9, Hgb 11.0, plt 160, neutrophils 79.6, Na 128, K 4.0, Bun 18, creat 0.90, eGFR 56, pending CXR CXR reported pulmonary congestion + PNA, Furosemide 241mqd Kcl 2068mqd x3 days, Doxycycline 100m43m x 7 days.

## 2019-04-08 ENCOUNTER — Non-Acute Institutional Stay: Payer: Medicare Other | Admitting: Internal Medicine

## 2019-04-08 ENCOUNTER — Encounter: Payer: Self-pay | Admitting: Internal Medicine

## 2019-04-08 ENCOUNTER — Other Ambulatory Visit: Payer: Self-pay

## 2019-04-08 DIAGNOSIS — N1831 Chronic kidney disease, stage 3a: Secondary | ICD-10-CM

## 2019-04-08 DIAGNOSIS — J189 Pneumonia, unspecified organism: Secondary | ICD-10-CM

## 2019-04-08 DIAGNOSIS — I10 Essential (primary) hypertension: Secondary | ICD-10-CM

## 2019-04-08 DIAGNOSIS — E871 Hypo-osmolality and hyponatremia: Secondary | ICD-10-CM

## 2019-04-08 DIAGNOSIS — R0781 Pleurodynia: Secondary | ICD-10-CM | POA: Diagnosis not present

## 2019-04-08 NOTE — Progress Notes (Signed)
Location:    Nursing Home Room Number: 79 Place of Service:  ALF [phone]) Provider:  Virgie Dad, MD  Virgie Dad, MD  Patient Care Team: Virgie Dad, MD as PCP - General (Internal Medicine) Mast, Man X, NP as Nurse Practitioner (Internal Medicine)  Extended Emergency Contact Information Primary Emergency Contact: Newsham,[name] Address: [address] [place],  [address] [name] of [place] Mobile Phone: [phone] Relation: Son  Code Status:  DNR  Goals of care: Advanced Directive information Advanced Directives 04/08/2019  Does Patient Have a Medical Advance Directive? Yes  Type of Advance Directive Out of facility DNR (pink MOST or yellow form)  Does patient want to make changes to medical advance directive? No - Patient declined  Copy of [place] in Chart? -  Would patient like information on creating a medical advance directive? -  Pre-existing out of facility DNR order (yellow form or pink MOST form) Yellow form placed in chart (order not valid for inpatient use)     Chief Complaint  Patient presents with  . Acute Visit    Follow up on pneumonia    HPI:  Pt is a 85 y.o. female seen today for an acute visit for  Follow up of her Pneumonia And Chest Pain  Patient is resident of AL in [facility] She has a history of uncontrolled hypertension, severe arthritis depression with anxiety.H/o PE in 2016,Hyponatremia, Chronic LE Edema, GERD,CKD Stage 3, Chronic Pain Syndrome  Patient was seen on Fri for Routine but then she had low grade temp of 99.3 and she was c/o Bilateral Pleuritic Chest Pain with Progressive cough  She was treated for Pleuritic Pain cough and Fever few days weeks ago with Levaquin. She had Infiltrate on her Chest Xray. But she continues to have pain and cough Her repeat  Chest Xray showed possible Pneumonia bilateral with ? CHF Was started on Doxycyline and Lasix for 3 days She continues to have  pain. Fatigue Cough and Pleuritic Pain and Decreased appetite No Fever anymore.No hypoxia Weight is stable  Past Medical History:  Diagnosis Date  . Acquired cyst of kidney    left  . Allergic rhinitis, cause unspecified   . Anemia, unspecified   . Chronic hyponatremia 09/05/2015  . CKD stage 2 due to type 2 diabetes mellitus ([place]) 11/18/2014  . Closed fracture of unspecified part of vertebral column without mention of spinal cord injury    T7 s/p kyphoplasty, T12  . Edema 2015  . Herpes simplex without mention of complication   . History of Epstein-Barr virus infection   . Hyposmolality and/or hyponatremia    07/13/15 Na 131  . Insomnia   . Insomnia, unspecified   . Left cavernous carotid aneurysm 08/2008   1-1/59mm aneurysm of cavernous carotid artery  . Malignant neoplasm of breast (female), unspecified site 1990   left. Had surgerey and chemo, but no radiation  . 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 Neuropathy   . Osteoarthrosis of knee    bilaterally  . Osteoarthrosis, hip   . Osteoarthrosis, unspecified whether generalized or localized, forearm    bilaterally wrist  . Other and unspecified nonspecific immunological findings    positive ANA  . Other B-complex deficiencies   . Pain in joint, site unspecified    severe, diffuse, chronic pain  . Positive ANA (antinuclear antibody)   .  Pulmonary embolism ([place]) 05/07/2015  . Pure hypercholesterolemia   . Senile osteoporosis   . Unspecified essential hypertension   . Unspecified gastritis and gastroduodenitis without mention of hemorrhage   . Unspecified hypothyroidism   . Unspecified vitamin D deficiency    Past Surgical History:  Procedure Laterality Date  . BREAST SURGERY Left   . DILATATION & CURETTAGE/HYSTEROSCOPY WITH MYOSURE N/A 06/27/2017   Procedure: DILATATION & CURETTAGE/HYSTEROSCOPY WITH MYOSURE;  Surgeon: Servando Salina, MD;  Location: [facility];  Service:  Gynecology;  Laterality: N/A;  . DILATION AND CURETTAGE OF UTERUS  X 2  . FEMUR IM NAIL Right 03/18/2015   Procedure: INTRAMEDULLARY (IM) NAIL FEMORAL;  Surgeon: Rod Can, MD;  Location: [facility];  Service: Orthopedics;  Laterality: Right;  . FRACTURE SURGERY     hip  . KYPHOPLASTY  07/03/2010   T12  . KYPHOPLASTY     C7  . LAPAROSCOPIC CHOLECYSTECTOMY  09/20/2006  . MASTECTOMY Left 04/29/1989  . MIDDLE EAR SURGERY Bilateral 1938  . NASAL SINUS SURGERY  1990 & 2000  . ORIF HIP FRACTURE Left 06/13/2007   following a syncopal episode  . TONSILLECTOMY  1968    Allergies  Allergen Reactions  . Aspirin     Drop in body temp with large quantities, can tolerate low doses of aspirin  . Hctz [Hydrochlorothiazide] Other (See Comments)    Hyponatremia  . Penicillins Swelling    Has patient had a PCN reaction causing immediate rash, facial/tongue/throat swelling, SOB or lightheadedness with hypotension: No Has patient had a PCN reaction causing severe rash involving mucus membranes or skin necrosis: No Has patient had a PCN reaction that required hospitalization No Has patient had a PCN reaction occurring within the last 10 years: No If all of the above answers are "NO", then may proceed with Cephalosporin use.    Allergies as of 04/08/2019      Reactions   Aspirin    Drop in body temp with large quantities, can tolerate low doses of aspirin   Hctz [hydrochlorothiazide] Other (See Comments)   Hyponatremia   Penicillins Swelling   Has patient had a PCN reaction causing immediate rash, facial/tongue/throat swelling, SOB or lightheadedness with hypotension: No Has patient had a PCN reaction causing severe rash involving mucus membranes or skin necrosis: No Has patient had a PCN reaction that required hospitalization No Has patient had a PCN reaction occurring within the last 10 years: No If all of the above answers are "NO", then may proceed with Cephalosporin use.      Medication List        Accurate as of April 08, 2019 11:12 AM. If you have any questions, ask your nurse or doctor.        acetaminophen 500 MG tablet Commonly known as: TYLENOL Take 1,000 mg by mouth daily.   acetaminophen 500 MG tablet Commonly known as: TYLENOL Take 500 mg by mouth every morning.   amLODipine 5 MG tablet Commonly known as: NORVASC Take 5 mg by mouth daily.   Biofreeze 4 % Gel Generic drug: Menthol (Topical Analgesic) Apply 1 application topically 3 (three) times daily as needed. Knee and hand pain   cloNIDine 0.1 MG tablet Commonly known as: CATAPRES Take 0.1 mg by mouth daily.   doxycycline 100 MG capsule Commonly known as: VIBRAMYCIN Take 100 mg by mouth 2 (two) times daily. For 7 days   famotidine 10 MG tablet Commonly known as: PEPCID Take 10 mg by mouth at bedtime.  feeding supplement Liqd Take 1 Container by mouth 2 (two) times daily between meals. (1000 & 1600) Prefers Chocolate   fentaNYL 12 MCG/HR Commonly known as: [place] 1 patch onto the skin every 3 (three) days.   furosemide 20 MG tablet Commonly known as: LASIX Take 20 mg by mouth. Once a day for 3 days Once A Day   hydrALAZINE 100 MG tablet Commonly known as: APRESOLINE Take 100 mg by mouth daily. If SBP >160 recheck B/P and document in vital sign record.   hydrALAZINE 50 MG tablet Commonly known as: APRESOLINE Take 50 mg by mouth 2 (two) times daily.   ipratropium 0.06 % nasal spray Commonly known as: ATROVENT Place 2 sprays into both nostrils 2 (two) times daily as needed for rhinitis. PRN-NASAL DRAINAGE   irbesartan 300 MG tablet Commonly known as: AVAPRO Take 300 mg by mouth daily. HOLD for B/P [id number], If systolic B/P is over 0000000, recheck B/P, document in Vital Signs.   levothyroxine 150 MCG tablet Commonly known as: SYNTHROID Take 150 mcg by mouth daily before breakfast. (0730)   Lidocaine 4 % Ptch Apply topically. Apply one patch topically to left hip every  morning and remove every evening; resident may self apply; keep on medcart Twice A Day   Lidocaine 4 % Ptch Apply topically. Apply one patch topically to right shoulder every morning and remove every evening; resident may self apply; keep on med cart Twice A Day   Lidocaine 4 % Ptch Apply topically. Apply one patch topically to left knee every morning and remove every evening; resident may self apply; keep on med cart Twice A Day   loratadine 10 MG tablet Commonly known as: CLARITIN Take 1 tablet (10 mg total) by mouth daily.   meloxicam 15 MG tablet Commonly known as: MOBIC Take 15 mg by mouth daily.   mirtazapine 30 MG tablet Commonly known as: REMERON Take 30 mg by mouth at bedtime.   ondansetron 4 MG tablet Commonly known as: ZOFRAN Take 4 mg by mouth every 8 (eight) hours as needed for nausea or vomiting.   Oxycodone HCl 10 MG Tabs Take 10 mg by mouth every 8 (eight) hours as needed.   oxyCODONE-acetaminophen 10-325 MG tablet Commonly known as: PERCOCET Take one tablet by mouth four times daily 6 am, 12 am, 6 pm, and 12 pm for pain   pantoprazole 40 MG tablet Commonly known as: PROTONIX Take 40 mg by mouth daily.   polyethylene glycol 17 g packet Commonly known as: MIRALAX / GLYCOLAX Take 17 g by mouth daily.   potassium chloride 20 MEQ packet Commonly known as: KLOR-CON Take by mouth. Once a day for 3 days Once A Day   promethazine 25 MG suppository Commonly known as: PHENERGAN Place 25 mg rectally every 6 (six) hours as needed for nausea or vomiting. nausea WITH vomiting x 24 hours. Clear liquid diet x24 hours. If NOT effective, notify MD.   psyllium 58.6 % powder Commonly known as: METAMUCIL Take 1 packet by mouth daily. Mix in 6 oz of water or juice   saccharomyces boulardii 250 MG capsule Commonly known as: FLORASTOR Take 250 mg by mouth 2 (two) times daily.   sennosides-docusate sodium 8.6-50 MG tablet Commonly known as: SENOKOT-S Take 1 tablet  by mouth daily. Hold for loose stool   Shingrix injection Generic drug: Zoster Vaccine Adjuvanted Inject 0.5 mLs into the muscle once. Shingrix 0.5 ml IM x 1 repeated in 6 months Once - One Time  on 09/09/2019   sodium chloride 1 g tablet Take 1 g by mouth 2 (two) times daily.   UNABLE TO FIND Med Name: Hydrocolloid dressing 4x4. Change dressing to the coccyx as needed. Check patency daily   zinc oxide 20 % ointment Apply 1 application topically 3 (three) times daily as needed for irritation. Apply to buttocks for redness/dark pink/excoriation       Review of Systems  Constitutional: Negative.   HENT: Negative.   Respiratory: Positive for cough, chest tightness and shortness of breath.   Cardiovascular: Positive for chest pain and leg swelling.  Gastrointestinal: Negative.   Genitourinary: Negative.   Musculoskeletal: Positive for arthralgias, back pain and gait problem.  Skin: Negative.   Neurological: Positive for weakness.  Psychiatric/Behavioral: Negative.   All other systems reviewed and are negative.   Immunization History  Administered Date(s) Administered  . Influenza Split 02/13/2013  . Influenza-Unspecified 02/27/2014, 02/12/2015, 02/25/2016, 03/08/2017, 02/19/2018  . PPD Test 09/09/2015  . Pneumococcal Conjugate-13 05/05/2016  . Pneumococcal Polysaccharide-23 02/13/2013  . Td 05/05/2016  . Zoster 05/04/2016   Pertinent  Health Maintenance Due  Topic Date Due  . MAMMOGRAM  03/21/1945  . INFLUENZA VACCINE  Completed  . DEXA SCAN  Completed  . PNA vac Low Risk Adult  Completed   Fall Risk  01/08/2018 01/02/2017 11/10/2015 10/20/2015 10/20/2015  Falls in the past year? No No No Yes No  Number falls in past yr: - - - 2 or more -  Injury with Fall? - - - Yes -  Risk Factor Category  - - - - -  Risk for fall due to : - - - History of fall(s);Impaired balance/gait -  Follow up - - - - -   Functional Status Survey:    Vitals:   04/08/19 1056  BP: (!) 168/90   Pulse: 68  Resp: 17  Temp: 98 F (36.7 C)  SpO2: 98%  Weight: 104 lb 8 oz (47.4 kg)  Height: 5\' 8"  (1.727 m)   Body mass index is 15.89 kg/m. Physical Exam Vitals signs reviewed.  Constitutional:      Appearance: Normal appearance.  HENT:     Head: Normocephalic.     Nose: Nose normal.     Mouth/Throat:     Mouth: Mucous membranes are moist.     Pharynx: Oropharynx is clear.  Eyes:     Pupils: Pupils are equal, round, and reactive to light.  Neck:     Musculoskeletal: Neck supple.  Cardiovascular:     Rate and Rhythm: Normal rate and regular rhythm.     Pulses: Normal pulses.     Heart sounds: Normal heart sounds.  Pulmonary:     Effort: Pulmonary effort is normal.     Breath sounds: Rales present.  Abdominal:     General: Abdomen is flat. Bowel sounds are normal.     Palpations: Abdomen is soft.  Musculoskeletal:     Comments: Mild edema Bilateral  Skin:    General: Skin is warm.  Neurological:     General: No focal deficit present.     Mental Status: She is alert and oriented to person, place, and time.  Psychiatric:        Mood and Affect: Mood normal.        Thought Content: Thought content normal.        Judgment: Judgment normal.     Labs reviewed: Recent Labs    07/12/18  09/17/18 12/04/18 12/20/18  NA 127*   < >  130* 131* 131*  K 4.3   < > 3.8 3.7 3.7  CL 94  --   --   --  98  CO2 24  --   --   --  27  BUN 25*   < > 16 14 18   CREATININE 1.1   < > 0.9 0.9 1.0  CALCIUM 9.3  --   --   --  8.2   < > = values in this interval not displayed.   Recent Labs    12/04/18  AST 16  ALT 7   Recent Labs    07/16/18  WBC 5.0  HGB 11.8*  HCT 34*  PLT 194   Lab Results  Component Value Date   TSH 2.73 07/16/2018   No results found for: HGBA1C Lab Results  Component Value Date   CHOL 175 11/10/2014   HDL 53 11/10/2014   LDLCALC 87 11/10/2014   TRIG 173 (A) 11/10/2014    Significant Diagnostic Results in last 30 days:  No results found.   Assessment/Plan Pneumonia of both lower lobes due to infectious organism On Doxycyline No fever Check for Covid again. Has been negative before [place] Count was normal Chronic Hyponatremia Na 128 Today is Last dose of Lasix Already on Sodium tabs Repeat BMP in 1 week Pleuritic pain Will start on Prednisone 20 mg QD for 5 days Already on Meloxicam Stage 3a chronic kidney disease Creat stable Essential hypertension Controlled on Hydralazine, Norvasc and Clonidine     Family/ staff Communication:   Labs/tests ordered: BMP in 1 week  Total time spent in this patient care encounter was  25_  minutes; greater than 50% of the visit spent counseling patient and staff, reviewing records , Labs and coordinating care for problems addressed at this encounter.

## 2019-04-10 ENCOUNTER — Other Ambulatory Visit: Payer: Self-pay | Admitting: Internal Medicine

## 2019-04-10 MED ORDER — OXYCODONE-ACETAMINOPHEN 10-325 MG PO TABS: ORAL_TABLET | ORAL | 0 refills | Status: DC

## 2019-04-15 DIAGNOSIS — I10 Essential (primary) hypertension: Secondary | ICD-10-CM | POA: Diagnosis not present

## 2019-04-30 DIAGNOSIS — Z20828 Contact with and (suspected) exposure to other viral communicable diseases: Secondary | ICD-10-CM | POA: Diagnosis not present

## 2019-05-01 ENCOUNTER — Other Ambulatory Visit: Payer: Self-pay | Admitting: *Deleted

## 2019-05-01 MED ORDER — FENTANYL 12 MCG/HR TD PT72: 1.0000 | MEDICATED_PATCH | TRANSDERMAL | 0 refills | Status: DC

## 2019-05-01 NOTE — Telephone Encounter (Signed)
Received fax refill request from [facility] and sent to Dr. Lyndel Safe for approval.

## 2019-05-06 DIAGNOSIS — Z20828 Contact with and (suspected) exposure to other viral communicable diseases: Secondary | ICD-10-CM | POA: Diagnosis not present

## 2019-05-08 ENCOUNTER — Other Ambulatory Visit: Payer: Self-pay | Admitting: *Deleted

## 2019-05-08 MED ORDER — OXYCODONE-ACETAMINOPHEN 10-325 MG PO TABS: ORAL_TABLET | ORAL | 0 refills | Status: DC

## 2019-05-08 NOTE — Telephone Encounter (Signed)
Received fax from [facility] Pended Rx and sent to Dr. Gupta for approval.  

## 2019-05-14 DIAGNOSIS — Z20828 Contact with and (suspected) exposure to other viral communicable diseases: Secondary | ICD-10-CM | POA: Diagnosis not present

## 2019-05-20 DIAGNOSIS — Z23 Encounter for immunization: Secondary | ICD-10-CM | POA: Diagnosis not present

## 2019-05-27 DIAGNOSIS — E7800 Pure hypercholesterolemia, unspecified: Secondary | ICD-10-CM | POA: Diagnosis not present

## 2019-05-27 DIAGNOSIS — D649 Anemia, unspecified: Secondary | ICD-10-CM | POA: Diagnosis not present

## 2019-05-27 DIAGNOSIS — D500 Iron deficiency anemia secondary to blood loss (chronic): Secondary | ICD-10-CM | POA: Diagnosis not present

## 2019-05-27 DIAGNOSIS — N182 Chronic kidney disease, stage 2 (mild): Secondary | ICD-10-CM | POA: Diagnosis not present

## 2019-05-27 LAB — BASIC METABOLIC PANEL
BUN: 18 (ref 4–21)
CO2: 29 — AB (ref 13–22)
Chloride: 95 — AB (ref 99–108)
Creatinine: 0.9 (ref 0.5–1.1)
Glucose: 84
Potassium: 3.9 (ref 3.4–5.3)
Sodium: 129 — AB (ref 137–147)

## 2019-05-27 LAB — COMPREHENSIVE METABOLIC PANEL: Calcium: 8.6 — AB (ref 8.7–10.7)

## 2019-05-30 ENCOUNTER — Other Ambulatory Visit: Payer: Self-pay | Admitting: *Deleted

## 2019-05-30 NOTE — Telephone Encounter (Signed)
Received fax from [facility] Pended Rx and sent to Dr. Gupta for approval.  

## 2019-05-31 ENCOUNTER — Other Ambulatory Visit: Payer: Self-pay | Admitting: *Deleted

## 2019-05-31 MED ORDER — FENTANYL 12 MCG/HR TD PT72: 1.0000 | MEDICATED_PATCH | TRANSDERMAL | 0 refills | Status: DC

## 2019-05-31 NOTE — Telephone Encounter (Signed)
[facility] fax refill Request Pended and sent to Dr. Lyndel Safe for approval.

## 2019-06-12 ENCOUNTER — Other Ambulatory Visit: Payer: Self-pay | Admitting: *Deleted

## 2019-06-12 MED ORDER — OXYCODONE-ACETAMINOPHEN 10-325 MG PO TABS: ORAL_TABLET | ORAL | 0 refills | Status: DC

## 2019-06-12 NOTE — Telephone Encounter (Signed)
[facility] Pended Rx and sent to Dr. Lyndel Safe for approval.

## 2019-06-17 DIAGNOSIS — Z23 Encounter for immunization: Secondary | ICD-10-CM | POA: Diagnosis not present

## 2019-07-01 ENCOUNTER — Other Ambulatory Visit: Payer: Self-pay | Admitting: *Deleted

## 2019-07-01 DIAGNOSIS — I10 Essential (primary) hypertension: Secondary | ICD-10-CM | POA: Diagnosis not present

## 2019-07-01 LAB — BASIC METABOLIC PANEL
BUN: 22 — AB (ref 4–21)
BUN: 22 — AB (ref 4–21)
CO2: 26 — AB (ref 13–22)
Chloride: 96 — AB (ref 99–108)
Creatinine: 1 (ref 0.5–1.1)
Creatinine: 1 (ref 0.5–1.1)
Glucose: 77
Glucose: 87
Potassium: 4.2 (ref 3.4–5.3)
Sodium: 129 — AB (ref 137–147)
Sodium: 129 — AB (ref 137–147)

## 2019-07-01 LAB — COMPREHENSIVE METABOLIC PANEL
Calcium: 8.6 — AB (ref 8.7–10.7)
Calcium: 8.6 — AB (ref 8.7–10.7)
GFR calc Af Amer: 60
GFR calc non Af Amer: 52

## 2019-07-01 MED ORDER — FENTANYL 12 MCG/HR TD PT72: 1.0000 | MEDICATED_PATCH | TRANSDERMAL | 0 refills | Status: DC

## 2019-07-01 NOTE — Telephone Encounter (Signed)
Received refill Request from [facility] °Pended Rx and sent to Dr. Gupta for approval.  °

## 2019-07-10 ENCOUNTER — Other Ambulatory Visit: Payer: Self-pay | Admitting: *Deleted

## 2019-07-10 MED ORDER — OXYCODONE-ACETAMINOPHEN 10-325 MG PO TABS: ORAL_TABLET | ORAL | 0 refills | Status: DC

## 2019-07-10 NOTE — Telephone Encounter (Signed)
Received fax from [facility] Pended Rx and sent to Dr. Gupta for approval.  

## 2019-07-12 ENCOUNTER — Encounter: Payer: Self-pay | Admitting: Internal Medicine

## 2019-07-12 NOTE — Telephone Encounter (Signed)
Message forwarded to Gupta, Anjali L, MD  

## 2019-07-17 ENCOUNTER — Non-Acute Institutional Stay: Payer: Medicare Other | Admitting: Internal Medicine

## 2019-07-17 ENCOUNTER — Encounter: Payer: Self-pay | Admitting: Internal Medicine

## 2019-07-17 DIAGNOSIS — E871 Hypo-osmolality and hyponatremia: Secondary | ICD-10-CM

## 2019-07-17 DIAGNOSIS — R1312 Dysphagia, oropharyngeal phase: Secondary | ICD-10-CM

## 2019-07-17 DIAGNOSIS — E039 Hypothyroidism, unspecified: Secondary | ICD-10-CM | POA: Diagnosis not present

## 2019-07-17 DIAGNOSIS — M8949 Other hypertrophic osteoarthropathy, multiple sites: Secondary | ICD-10-CM | POA: Diagnosis not present

## 2019-07-17 DIAGNOSIS — G894 Chronic pain syndrome: Secondary | ICD-10-CM

## 2019-07-17 DIAGNOSIS — M159 Polyosteoarthritis, unspecified: Secondary | ICD-10-CM

## 2019-07-17 DIAGNOSIS — I10 Essential (primary) hypertension: Secondary | ICD-10-CM | POA: Diagnosis not present

## 2019-07-17 DIAGNOSIS — R600 Localized edema: Secondary | ICD-10-CM | POA: Diagnosis not present

## 2019-07-17 NOTE — Progress Notes (Signed)
Location:   [place] Room Number: 35 Place of Service:  ALF [phone]) Provider: Virgie Dad, MD  Virgie Dad, MD  Patient Care Team: Virgie Dad, MD as PCP - General (Internal Medicine) Mast, Man X, NP as Nurse Practitioner (Internal Medicine)  Extended Emergency Contact Information Primary Emergency Contact: Brownlee,[name] Address: [address] [place], [place] [address] [place] Mobile Phone: [phone] Relation: Son  Code Status:  DNR Goals of care: Advanced Directive information Advanced Directives 07/17/2019  Does Patient Have a Medical Advance Directive? Yes  Type of Paramedic of [place];Living will;Out of facility DNR (pink MOST or yellow form)  Does patient want to make changes to medical advance directive? No - Patient declined  Copy of [place] in Chart? Yes - validated most recent copy scanned in chart (See row information)  Would patient like information on creating a medical advance directive? -  Pre-existing out of facility DNR order (yellow form or pink MOST form) Yellow form placed in chart (order not valid for inpatient use);Pink MOST form placed in chart (order not valid for inpatient use)     Chief Complaint  Patient presents with  . Medical Management of Chronic Issues  . Health Maintenance    Mammogram    HPI:  Pt is a 86 y.o. female seen today for medical management of chronic diseases.   Patient is resident of AL in [facility] She has a history of uncontrolled hypertension, severe arthritis depression with anxiety.H/o PE in 2016,Hyponatremia, Chronic LE Edema, GERD,CKD Stage 3, Chronic Pain Syndrome  Her son wanted me to evaluate her for Nausea, Hearing loss and MOST form  Nausea Patient says she has been having nausea recently.   in detail if you ask the patient she states that the medications get stuck in her throat.  She is having some issues  with swallowing them.  The nurses wanted to know if we can crush these medications.  No vomiting or abdominal pain Hearing loss Patient had some worsening in  Her hearing .  She wanted me to write down everything today as she could not understand due to the mask Hypertension Patient is on a number of medications for her blood pressure.  She cannot tolerate diuretics because of hyponatremia and Lopressor due to bradycardia Bilateral knee arthritis On high-dose of narcotics.  Patient states she continues to have severe pain in her knee.  She wants to know if we can decrease the dose of her narcotics and let her just use Biofreeze Weight loss Patient has lost 7 to 8 pounds in past 3 months    Past Medical History:  Diagnosis Date  . Acquired cyst of kidney    left  . Allergic rhinitis, cause unspecified   . Anemia, unspecified   . Chronic hyponatremia 09/05/2015  . CKD stage 2 due to type 2 diabetes mellitus ([place]) 11/18/2014  . Closed fracture of unspecified part of vertebral column without mention of spinal cord injury    T7 s/p kyphoplasty, T12  . Edema 2015  . Herpes simplex without mention of complication   . History of Epstein-Barr virus infection   . Hyposmolality and/or hyponatremia    07/13/15 Na 131  . Insomnia   . Insomnia, unspecified   . Left cavernous carotid aneurysm 08/2008   1-1/51mm aneurysm of cavernous carotid artery  . Malignant neoplasm of breast (female),  unspecified site 1990   left. Had surgerey and chemo, but no radiation  . 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 Neuropathy   . Osteoarthrosis of knee    bilaterally  . Osteoarthrosis, hip   . Osteoarthrosis, unspecified whether generalized or localized, forearm    bilaterally wrist  . Other and unspecified nonspecific immunological findings    positive ANA  . Other B-complex deficiencies   . Pain in joint, site unspecified    severe, diffuse, chronic pain  .  Positive ANA (antinuclear antibody)   . Pulmonary embolism ([place]) 05/07/2015  . Pure hypercholesterolemia   . Senile osteoporosis   . Unspecified essential hypertension   . Unspecified gastritis and gastroduodenitis without mention of hemorrhage   . Unspecified hypothyroidism   . Unspecified vitamin D deficiency    Past Surgical History:  Procedure Laterality Date  . BREAST SURGERY Left   . DILATATION & CURETTAGE/HYSTEROSCOPY WITH MYOSURE N/A 06/27/2017   Procedure: DILATATION & CURETTAGE/HYSTEROSCOPY WITH MYOSURE;  Surgeon: Servando Salina, MD;  Location: [facility];  Service: Gynecology;  Laterality: N/A;  . DILATION AND CURETTAGE OF UTERUS  X 2  . FEMUR IM NAIL Right 03/18/2015   Procedure: INTRAMEDULLARY (IM) NAIL FEMORAL;  Surgeon: Rod Can, MD;  Location: [facility];  Service: Orthopedics;  Laterality: Right;  . FRACTURE SURGERY     hip  . KYPHOPLASTY  07/03/2010   T12  . KYPHOPLASTY     C7  . LAPAROSCOPIC CHOLECYSTECTOMY  09/20/2006  . MASTECTOMY Left 04/29/1989  . MIDDLE EAR SURGERY Bilateral 1938  . NASAL SINUS SURGERY  1990 & 2000  . ORIF HIP FRACTURE Left 06/13/2007   following a syncopal episode  . TONSILLECTOMY  1968    Allergies  Allergen Reactions  . Aspirin     Drop in body temp with large quantities, can tolerate low doses of aspirin  . Hctz [Hydrochlorothiazide] Other (See Comments)    Hyponatremia  . Penicillins Swelling    Has patient had a PCN reaction causing immediate rash, facial/tongue/throat swelling, SOB or lightheadedness with hypotension: No Has patient had a PCN reaction causing severe rash involving mucus membranes or skin necrosis: No Has patient had a PCN reaction that required hospitalization No Has patient had a PCN reaction occurring within the last 10 years: No If all of the above answers are "NO", then may proceed with Cephalosporin use.    Allergies as of 07/17/2019      Reactions   Aspirin    Drop in body temp with large quantities,  can tolerate low doses of aspirin   Hctz [hydrochlorothiazide] Other (See Comments)   Hyponatremia   Penicillins Swelling   Has patient had a PCN reaction causing immediate rash, facial/tongue/throat swelling, SOB or lightheadedness with hypotension: No Has patient had a PCN reaction causing severe rash involving mucus membranes or skin necrosis: No Has patient had a PCN reaction that required hospitalization No Has patient had a PCN reaction occurring within the last 10 years: No If all of the above answers are "NO", then may proceed with Cephalosporin use.      Medication List       Accurate as of July 17, 2019  9:56 AM. If you have any questions, ask your nurse or doctor.        STOP taking these medications   meloxicam 15 MG tablet Commonly known as: MOBIC Stopped by: Virgie Dad, MD   potassium chloride  20 MEQ packet Commonly known as: KLOR-CON Stopped by: Virgie Dad, MD     TAKE these medications   acetaminophen 500 MG tablet Commonly known as: TYLENOL Take 1,000 mg by mouth daily.   acetaminophen 500 MG tablet Commonly known as: TYLENOL Take 500 mg by mouth every morning.   amLODipine 5 MG tablet Commonly known as: NORVASC Take 5 mg by mouth daily.   Biofreeze 4 % Gel Generic drug: Menthol (Topical Analgesic) Apply 1 application topically 3 (three) times daily as needed. Knee and hand pain   cloNIDine 0.1 MG tablet Commonly known as: CATAPRES Take 0.1 mg by mouth daily.   famotidine 10 MG tablet Commonly known as: PEPCID Take 10 mg by mouth at bedtime.   feeding supplement Liqd Take 1 Container by mouth 2 (two) times daily between meals. (1000 & 1600) Prefers Chocolate   fentaNYL 12 MCG/HR Commonly known as: [place] 1 patch onto the skin every 3 (three) days.   hydrALAZINE 100 MG tablet Commonly known as: APRESOLINE Take 100 mg by mouth daily. If SBP >160 recheck B/P and document in vital sign record.   hydrALAZINE 50 MG  tablet Commonly known as: APRESOLINE Take 50 mg by mouth 2 (two) times daily.   ipratropium 0.06 % nasal spray Commonly known as: ATROVENT Place 2 sprays into both nostrils 2 (two) times daily as needed for rhinitis. PRN-NASAL DRAINAGE   irbesartan 300 MG tablet Commonly known as: AVAPRO Take 300 mg by mouth daily. HOLD for B/P [id number], If systolic B/P is over 0000000, recheck B/P, document in Vital Signs.   levothyroxine 150 MCG tablet Commonly known as: SYNTHROID Take 150 mcg by mouth daily before breakfast. (0730)   Lidocaine 4 % Ptch Apply topically. Apply one patch topically to left hip every morning and remove every evening; resident may self apply; keep on medcart Twice A Day   Lidocaine 4 % Ptch Apply topically. Apply one patch topically to right shoulder every morning and remove every evening; resident may self apply; keep on med cart Twice A Day   Lidocaine 4 % Ptch Apply topically. Apply one patch topically to left knee every morning and remove every evening; resident may self apply; keep on med cart Twice A Day   loratadine 10 MG tablet Commonly known as: CLARITIN Take 1 tablet (10 mg total) by mouth daily.   mirtazapine 30 MG tablet Commonly known as: REMERON Take 30 mg by mouth at bedtime.   ondansetron 4 MG tablet Commonly known as: ZOFRAN Take 4 mg by mouth every 8 (eight) hours as needed for nausea or vomiting.   Oxycodone HCl 10 MG Tabs Take 10 mg by mouth every 8 (eight) hours as needed.   oxyCODONE-acetaminophen 10-325 MG tablet Commonly known as: PERCOCET Take one tablet by mouth four times daily 6 am, 12 am, 6 pm, and 12 pm for pain   pantoprazole 40 MG tablet Commonly known as: PROTONIX Take 40 mg by mouth daily.   polyethylene glycol 17 g packet Commonly known as: MIRALAX / GLYCOLAX Take 17 g by mouth daily.   promethazine 25 MG suppository Commonly known as: PHENERGAN Place 25 mg rectally every 6 (six) hours as needed for nausea or  vomiting. nausea WITH vomiting x 24 hours. Clear liquid diet x24 hours. If NOT effective, notify MD.   psyllium 58.6 % powder Commonly known as: METAMUCIL Take 1 packet by mouth daily. Mix in 6 oz of water or juice   sennosides-docusate sodium 8.6-50  MG tablet Commonly known as: SENOKOT-S Take 1 tablet by mouth daily. Hold for loose stool   Shingrix injection Generic drug: Zoster Vaccine Adjuvanted Inject 0.5 mLs into the muscle once. Shingrix 0.5 ml IM x 1 repeated in 6 months Once - One Time on 09/09/2019   sodium chloride 1 g tablet Take 1 g by mouth 2 (two) times daily.   UNABLE TO FIND Med Name: Hydrocolloid dressing 4x4. Change dressing to the coccyx as needed. Check patency daily   zinc oxide 20 % ointment Apply 1 application topically 3 (three) times daily as needed for irritation. Apply to buttocks for redness/dark pink/excoriation       Review of Systems  Constitutional: Positive for appetite change and unexpected weight change.  HENT: Positive for congestion and rhinorrhea.   Respiratory: Negative for cough.   Gastrointestinal: Positive for nausea.  Genitourinary: Negative.   Musculoskeletal: Positive for arthralgias, gait problem and joint swelling.  Skin: Negative.   Neurological: Negative for dizziness.  Psychiatric/Behavioral: Negative.     Immunization History  Administered Date(s) Administered  . Influenza Split 02/13/2013  . Influenza, High Dose Seasonal PF 02/26/2019  . Influenza-Unspecified 02/27/2014, 02/12/2015, 02/25/2016, 03/08/2017, 02/19/2018  . Moderna SARS-COVID-2 Vaccination 05/20/2019, 06/17/2019  . PPD Test 09/09/2015  . Pneumococcal Conjugate-13 05/05/2016  . Pneumococcal Polysaccharide-23 02/13/2013  . Td 05/05/2016  . Zoster 05/04/2016  . Zoster Recombinat (Shingrix) 03/08/2019, 03/09/2019   Pertinent  Health Maintenance Due  Topic Date Due  . MAMMOGRAM  03/21/1945  . INFLUENZA VACCINE  Completed  . DEXA SCAN  Completed  . PNA  vac Low Risk Adult  Completed   Fall Risk  04/08/2019 01/08/2018 01/02/2017 11/10/2015 10/20/2015  Falls in the past year? 0 No No No Yes  Comment Emmi Telephone Survey: data to providers prior to load - - - -  Number falls in past yr: - - - - 2 or more  Injury with Fall? - - - - Yes  Risk Factor Category  - - - - -  Risk for fall due to : - - - - History of fall(s);Impaired balance/gait  Follow up - - - - -   Functional Status Survey:    Vitals:   07/17/19 0943  BP: (!) 158/88  Pulse: 70  Resp: 18  Temp: (!) 97.3 F (36.3 C)  SpO2: 99%  Weight: 100 lb 9.6 oz (45.6 kg)  Height: 5\' 8"  (1.727 m)   Body mass index is 15.3 kg/m. Physical Exam  Constitutional: Oriented to person, place, and time. Well-developed and well-nourished.  HENT:  Head: Normocephalic.  Mouth/Throat: Oropharynx is clear and moist.  Eyes: Pupils are equal, round, and reactive to light.  Neck: Neck supple.  Ears  No wax in right ear Left had some wax Cardiovascular: Normal rate and normal heart sounds.  No murmur heard. Pulmonary/Chest: Effort normal and breath sounds normal. No respiratory distress. No wheezes. She has no rales.  Abdominal: Soft. Bowel sounds are normal. No distension. There is no tenderness. There is no rebound.  Musculoskeletal: Mild edema Lymphadenopathy: none Neurological: Alert and oriented to person, place, and time.  Skin: Skin is warm and dry.  Psychiatric: Normal mood and affect. Behavior is normal. Thought content normal.    Labs reviewed: Recent Labs    12/20/18 0000 05/27/19 0000 07/01/19 0000  NA 131* 129* 129*  K 3.7 3.9 4.2  CL 98 95* 96*  CO2 27 29* 26*  BUN 18 18 22*  CREATININE 1.0 0.9 1.0  CALCIUM 8.2 8.6* 8.6*   Recent Labs    12/04/18 0000  AST 16  ALT 7   No results for input(s): WBC, NEUTROABS, HGB, HCT, MCV, PLT in the last 8760 hours. Lab Results  Component Value Date   TSH 2.73 07/16/2018   No results found for: HGBA1C Lab Results   Component Value Date   CHOL 175 11/10/2014   HDL 53 11/10/2014   LDLCALC 87 11/10/2014   TRIG 173 (A) 11/10/2014    Significant Diagnostic Results in last 30 days:  No results found.  Assessment/Plan Essential hypertension Will discontinue Clonidine Continue on Hydralazine,Amlodpine, and Avapro  Oropharyngeal dysphagia Speech to evaluate Meds to be crushed  Chronic Hyponatremia On Sodium tabls Sodium was 129 Hearing Loss Debrox drops for Left ear Audiology Referal for hearing aid Hypothyroidism, unspecified type TSH Normal in 3/20 Repeat   Bilateral leg edema Cannot use diuretics Ted hoses  Primary osteoarthritis involving multiple joints Patient is willing to try reduce dose of PO meds Will change Norco to TID And Increase Fentanyl to 61mcg  Depression with anxiety - Plan:  On high-dose of Remeron  Anemia of chronic disease - Plan:  Hgb Stable ACP Nurse in facility will review her MOST Form with Son    Family/ staff Communication:   Labs/tests ordered:  CBC,CMP,TSH  Total time spent in this patient care encounter was 45  minutes; greater than 50% of the visit spent counseling patient and staff, reviewing records , Labs and coordinating care for problems addressed at this encounter.

## 2019-07-18 DIAGNOSIS — D649 Anemia, unspecified: Secondary | ICD-10-CM | POA: Diagnosis not present

## 2019-07-18 DIAGNOSIS — M1991 Primary osteoarthritis, unspecified site: Secondary | ICD-10-CM | POA: Diagnosis not present

## 2019-07-18 DIAGNOSIS — M810 Age-related osteoporosis without current pathological fracture: Secondary | ICD-10-CM | POA: Diagnosis not present

## 2019-07-18 DIAGNOSIS — M1610 Unilateral primary osteoarthritis, unspecified hip: Secondary | ICD-10-CM | POA: Diagnosis not present

## 2019-07-18 DIAGNOSIS — N182 Chronic kidney disease, stage 2 (mild): Secondary | ICD-10-CM | POA: Diagnosis not present

## 2019-07-18 DIAGNOSIS — G4700 Insomnia, unspecified: Secondary | ICD-10-CM | POA: Diagnosis not present

## 2019-07-18 DIAGNOSIS — I10 Essential (primary) hypertension: Secondary | ICD-10-CM | POA: Diagnosis not present

## 2019-07-18 DIAGNOSIS — Z4789 Encounter for other orthopedic aftercare: Secondary | ICD-10-CM | POA: Diagnosis not present

## 2019-07-18 DIAGNOSIS — D500 Iron deficiency anemia secondary to blood loss (chronic): Secondary | ICD-10-CM | POA: Diagnosis not present

## 2019-07-18 DIAGNOSIS — R1311 Dysphagia, oral phase: Secondary | ICD-10-CM | POA: Diagnosis not present

## 2019-07-18 DIAGNOSIS — E7800 Pure hypercholesterolemia, unspecified: Secondary | ICD-10-CM | POA: Diagnosis not present

## 2019-07-18 DIAGNOSIS — W19XXXA Unspecified fall, initial encounter: Secondary | ICD-10-CM | POA: Diagnosis not present

## 2019-07-18 DIAGNOSIS — C50919 Malignant neoplasm of unspecified site of unspecified female breast: Secondary | ICD-10-CM | POA: Diagnosis not present

## 2019-07-18 DIAGNOSIS — S329XXA Fracture of unspecified parts of lumbosacral spine and pelvis, initial encounter for closed fracture: Secondary | ICD-10-CM | POA: Diagnosis not present

## 2019-07-18 DIAGNOSIS — R609 Edema, unspecified: Secondary | ICD-10-CM | POA: Diagnosis not present

## 2019-07-18 DIAGNOSIS — R55 Syncope and collapse: Secondary | ICD-10-CM | POA: Diagnosis not present

## 2019-07-18 DIAGNOSIS — S72001A Fracture of unspecified part of neck of right femur, initial encounter for closed fracture: Secondary | ICD-10-CM | POA: Diagnosis not present

## 2019-07-18 DIAGNOSIS — E039 Hypothyroidism, unspecified: Secondary | ICD-10-CM | POA: Diagnosis not present

## 2019-07-18 DIAGNOSIS — R42 Dizziness and giddiness: Secondary | ICD-10-CM | POA: Diagnosis not present

## 2019-07-23 DIAGNOSIS — N182 Chronic kidney disease, stage 2 (mild): Secondary | ICD-10-CM | POA: Diagnosis not present

## 2019-07-23 DIAGNOSIS — S329XXA Fracture of unspecified parts of lumbosacral spine and pelvis, initial encounter for closed fracture: Secondary | ICD-10-CM | POA: Diagnosis not present

## 2019-07-23 DIAGNOSIS — R42 Dizziness and giddiness: Secondary | ICD-10-CM | POA: Diagnosis not present

## 2019-07-23 DIAGNOSIS — W19XXXA Unspecified fall, initial encounter: Secondary | ICD-10-CM | POA: Diagnosis not present

## 2019-07-23 DIAGNOSIS — R55 Syncope and collapse: Secondary | ICD-10-CM | POA: Diagnosis not present

## 2019-07-23 DIAGNOSIS — R1311 Dysphagia, oral phase: Secondary | ICD-10-CM | POA: Diagnosis not present

## 2019-07-25 DIAGNOSIS — S329XXA Fracture of unspecified parts of lumbosacral spine and pelvis, initial encounter for closed fracture: Secondary | ICD-10-CM | POA: Diagnosis not present

## 2019-07-25 DIAGNOSIS — R42 Dizziness and giddiness: Secondary | ICD-10-CM | POA: Diagnosis not present

## 2019-07-25 DIAGNOSIS — R1311 Dysphagia, oral phase: Secondary | ICD-10-CM | POA: Diagnosis not present

## 2019-07-25 DIAGNOSIS — I10 Essential (primary) hypertension: Secondary | ICD-10-CM | POA: Diagnosis not present

## 2019-07-25 DIAGNOSIS — N182 Chronic kidney disease, stage 2 (mild): Secondary | ICD-10-CM | POA: Diagnosis not present

## 2019-07-25 DIAGNOSIS — D649 Anemia, unspecified: Secondary | ICD-10-CM | POA: Diagnosis not present

## 2019-07-25 DIAGNOSIS — E039 Hypothyroidism, unspecified: Secondary | ICD-10-CM | POA: Diagnosis not present

## 2019-07-25 DIAGNOSIS — W19XXXA Unspecified fall, initial encounter: Secondary | ICD-10-CM | POA: Diagnosis not present

## 2019-07-25 DIAGNOSIS — R55 Syncope and collapse: Secondary | ICD-10-CM | POA: Diagnosis not present

## 2019-07-26 ENCOUNTER — Encounter: Payer: Self-pay | Admitting: Nurse Practitioner

## 2019-07-26 ENCOUNTER — Non-Acute Institutional Stay: Payer: Medicare Other | Admitting: Nurse Practitioner

## 2019-07-26 DIAGNOSIS — F331 Major depressive disorder, recurrent, moderate: Secondary | ICD-10-CM | POA: Diagnosis not present

## 2019-07-26 DIAGNOSIS — G894 Chronic pain syndrome: Secondary | ICD-10-CM | POA: Diagnosis not present

## 2019-07-26 DIAGNOSIS — E039 Hypothyroidism, unspecified: Secondary | ICD-10-CM | POA: Diagnosis not present

## 2019-07-26 DIAGNOSIS — I10 Essential (primary) hypertension: Secondary | ICD-10-CM | POA: Diagnosis not present

## 2019-07-26 DIAGNOSIS — Z7189 Other specified counseling: Secondary | ICD-10-CM | POA: Diagnosis not present

## 2019-07-26 NOTE — Progress Notes (Signed)
Location:   [place] Room Number: 35/A Place of Service:  SNF (31) Provider: [facility] Amyah Clawson NP  Virgie Dad, MD  Patient Care Team: Virgie Dad, MD as PCP - General (Internal Medicine) Dontarius Sheley X, NP as Nurse Practitioner (Internal Medicine)  Extended Emergency Contact Information Primary Emergency Contact: Bobby,[name] Address: [address] [place], [place] [address] [place] Mobile Phone: [phone] Relation: Son  Code Status:  DNR Goals of care: Advanced Directive information Advanced Directives 07/26/2019  Does Patient Have a Medical Advance Directive? Yes  Type of Advance Directive Out of facility DNR (pink MOST or yellow form)  Does patient want to make changes to medical advance directive? No - Patient declined  Copy of [place] in Chart? -  Would patient like information on creating a medical advance directive? -  Pre-existing out of facility DNR order (yellow form or pink MOST form) Pink MOST form placed in chart (order not valid for inpatient use)     Chief Complaint  Patient presents with  . Acute Visit    Decreased TSH    HPI:  Pt is a 86 y.o. female seen today for an acute visit for decreased TSH level 0.13 07/25/19. Hx of Hypothyroidism, on Levothyroxine 1109mcg qd. The patient denied, palpitation, change of appetite, anxiety/irritability, tremor, sweating, sensitive to heat, frequent BM, insomnia, or increased muscle weakness. Weight loss about #4Ibs in the past 4 months.  Hx of HTN, blood pressure is controlled on Amlodipine 5mg  qd, Hydralazine 100mg  qd, 50mg  bid, Irbesartan 300mg  qd. Chronic pain syndrome, controlled on Tylenol 1000mg  qd, Fentanyl 37mcg/hr patch, Norco 10/325mg  tid. Her mood is stable, on Mirtazapine 30mg  qd.    Past Medical History:  Diagnosis Date  . Acquired cyst of kidney    left  . Allergic rhinitis, cause unspecified   . Anemia, unspecified   . Chronic  hyponatremia 09/05/2015  . CKD stage 2 due to type 2 diabetes mellitus ([place]) 11/18/2014  . Closed fracture of unspecified part of vertebral column without mention of spinal cord injury    T7 s/p kyphoplasty, T12  . Edema 2015  . Herpes simplex without mention of complication   . History of Epstein-Barr virus infection   . Hyposmolality and/or hyponatremia    07/13/15 Na 131  . Insomnia   . Insomnia, unspecified   . Left cavernous carotid aneurysm 08/2008   1-1/71mm aneurysm of cavernous carotid artery  . Malignant neoplasm of breast (female), unspecified site 1990   left. Had surgerey and chemo, but no radiation  . 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 Neuropathy   . Osteoarthrosis of knee    bilaterally  . Osteoarthrosis, hip   . Osteoarthrosis, unspecified whether generalized or localized, forearm    bilaterally wrist  . Other and unspecified nonspecific immunological findings    positive ANA  . Other B-complex deficiencies   . Pain in joint, site unspecified    severe, diffuse, chronic pain  . Positive ANA (antinuclear antibody)   . Pulmonary embolism ([place]) 05/07/2015  . Pure hypercholesterolemia   . Senile osteoporosis   . Unspecified essential hypertension   . Unspecified gastritis and gastroduodenitis without mention of hemorrhage   . Unspecified hypothyroidism   . Unspecified vitamin D deficiency    Past Surgical History:  Procedure Laterality Date  . BREAST SURGERY Left   .  DILATATION & CURETTAGE/HYSTEROSCOPY WITH MYOSURE N/A 06/27/2017   Procedure: DILATATION & CURETTAGE/HYSTEROSCOPY WITH MYOSURE;  Surgeon: Servando Salina, MD;  Location: [facility];  Service: Gynecology;  Laterality: N/A;  . DILATION AND CURETTAGE OF UTERUS  X 2  . FEMUR IM NAIL Right 03/18/2015   Procedure: INTRAMEDULLARY (IM) NAIL FEMORAL;  Surgeon: Rod Can, MD;  Location: [facility];  Service: Orthopedics;  Laterality: Right;  . FRACTURE SURGERY      hip  . KYPHOPLASTY  07/03/2010   T12  . KYPHOPLASTY     C7  . LAPAROSCOPIC CHOLECYSTECTOMY  09/20/2006  . MASTECTOMY Left 04/29/1989  . MIDDLE EAR SURGERY Bilateral 1938  . NASAL SINUS SURGERY  1990 & 2000  . ORIF HIP FRACTURE Left 06/13/2007   following a syncopal episode  . TONSILLECTOMY  1968    Allergies  Allergen Reactions  . Aspirin     Drop in body temp with large quantities, can tolerate low doses of aspirin  . Hctz [Hydrochlorothiazide] Other (See Comments)    Hyponatremia  . Penicillins Swelling    Has patient had a PCN reaction causing immediate rash, facial/tongue/throat swelling, SOB or lightheadedness with hypotension: No Has patient had a PCN reaction causing severe rash involving mucus membranes or skin necrosis: No Has patient had a PCN reaction that required hospitalization No Has patient had a PCN reaction occurring within the last 10 years: No If all of the above answers are "NO", then may proceed with Cephalosporin use.    Allergies as of 07/26/2019      Reactions   Aspirin    Drop in body temp with large quantities, can tolerate low doses of aspirin   Hctz [hydrochlorothiazide] Other (See Comments)   Hyponatremia   Penicillins Swelling   Has patient had a PCN reaction causing immediate rash, facial/tongue/throat swelling, SOB or lightheadedness with hypotension: No Has patient had a PCN reaction causing severe rash involving mucus membranes or skin necrosis: No Has patient had a PCN reaction that required hospitalization No Has patient had a PCN reaction occurring within the last 10 years: No If all of the above answers are "NO", then may proceed with Cephalosporin use.      Medication List       Accurate as of July 26, 2019  3:22 PM. If you have any questions, ask your nurse or doctor.        acetaminophen 500 MG tablet Commonly known as: TYLENOL Take 1,000 mg by mouth daily.   acetaminophen 500 MG tablet Commonly known as: TYLENOL Take 500 mg  by mouth every morning.   amLODipine 5 MG tablet Commonly known as: NORVASC Take 5 mg by mouth daily.   Biofreeze 4 % Gel Generic drug: Menthol (Topical Analgesic) Apply 1 application topically 3 (three) times daily as needed. Knee and hand pain   Biofreeze 4 % Gel Generic drug: Menthol (Topical Analgesic) Apply to bilateral knees for pain may self administer may keep at bedside   famotidine 10 MG tablet Commonly known as: PEPCID Take 10 mg by mouth at bedtime.   feeding supplement Liqd Take 1 Container by mouth 2 (two) times daily between meals. (1000 & 1600) Prefers Chocolate   fentaNYL 25 MCG/HR Commonly known as: [place] 1 patch onto the skin every 3 (three) days.   GRX HYDROCOLLOID DRESSING EX Apply topically. Change dressing to coccyx as needed   hydrALAZINE 100 MG tablet Commonly known as: APRESOLINE Take 100 mg by mouth daily. If SBP >160 recheck  B/P and document in vital sign record.   hydrALAZINE 50 MG tablet Commonly known as: APRESOLINE Take 50 mg by mouth 2 (two) times daily.   ipratropium 0.06 % nasal spray Commonly known as: ATROVENT Place 2 sprays into both nostrils 2 (two) times daily as needed for rhinitis. PRN-NASAL DRAINAGE   irbesartan 300 MG tablet Commonly known as: AVAPRO Take 300 mg by mouth daily. HOLD for B/P [id number], If systolic B/P is over 0000000, recheck B/P, document in Vital Signs.   levothyroxine 150 MCG tablet Commonly known as: SYNTHROID Take 150 mcg by mouth daily before breakfast. (0730)   Lidocaine 4 % Ptch Apply topically. Apply one patch topically to left hip every morning and remove every evening; resident may self apply; keep on medcart Twice A Day   Lidocaine 4 % Ptch Apply topically. Apply one patch topically to right shoulder every morning and remove every evening; resident may self apply; keep on med cart Twice A Day   Lidocaine 4 % Ptch Apply topically. Apply one patch topically to left knee every morning and  remove every evening; resident may self apply; keep on med cart Twice A Day   loratadine 10 MG tablet Commonly known as: CLARITIN Take 1 tablet (10 mg total) by mouth daily.   mirtazapine 30 MG tablet Commonly known as: REMERON Take 30 mg by mouth at bedtime.   ondansetron 4 MG tablet Commonly known as: ZOFRAN Take 4 mg by mouth every 8 (eight) hours as needed for nausea or vomiting.   Oxycodone HCl 10 MG Tabs Take 10 mg by mouth every 8 (eight) hours as needed.   oxyCODONE-acetaminophen 10-325 MG tablet Commonly known as: PERCOCET Take 1 tablet by mouth 3 (three) times daily.   pantoprazole 40 MG tablet Commonly known as: PROTONIX Take 40 mg by mouth daily.   polyethylene glycol 17 g packet Commonly known as: MIRALAX / GLYCOLAX Take 17 g by mouth daily.   promethazine 25 MG suppository Commonly known as: PHENERGAN Place 25 mg rectally every 6 (six) hours as needed for nausea or vomiting. nausea WITH vomiting x 24 hours. Clear liquid diet x24 hours. If NOT effective, notify MD.   psyllium 58.6 % powder Commonly known as: METAMUCIL Take 1 packet by mouth daily. Mix in 6 oz of water or juice   sennosides-docusate sodium 8.6-50 MG tablet Commonly known as: SENOKOT-S Take 1 tablet by mouth daily. Hold for loose stool   Shingrix injection Generic drug: Zoster Vaccine Adjuvanted Inject 0.5 mLs into the muscle once. Shingrix 0.5 ml IM x 1 repeated in 6 months Once - One Time on 09/09/2019 Start taking on: September 09, 2019   sodium chloride 1 g tablet Take 1 g by mouth 2 (two) times daily.   zinc oxide 20 % ointment Apply 1 application topically 3 (three) times daily as needed for irritation. Apply to buttocks for redness/dark pink/excoriation       Review of Systems  Constitutional: Positive for unexpected weight change. Negative for activity change, appetite change, fatigue and fever.       Weight loss about #4Ibs in the past 4 months.   HENT: Positive for hearing  loss. Negative for congestion and voice change.   Eyes: Negative for visual disturbance.  Respiratory: Positive for cough. Negative for shortness of breath and wheezing.        DOE  Cardiovascular: Positive for leg swelling. Negative for chest pain and palpitations.  Gastrointestinal: Negative for abdominal distention, abdominal pain, constipation, diarrhea,  nausea and vomiting.  Genitourinary: Negative for difficulty urinating, dysuria, frequency and urgency.  Musculoskeletal: Positive for arthralgias, back pain and gait problem.  Skin: Negative for color change.  Neurological: Negative for dizziness, speech difficulty, weakness and headaches.  Psychiatric/Behavioral: Negative for agitation, behavioral problems, confusion, dysphoric mood and sleep disturbance. The patient is not nervous/anxious.     Immunization History  Administered Date(s) Administered  . Influenza Split 02/13/2013  . Influenza, High Dose Seasonal PF 02/26/2019  . Influenza-Unspecified 02/27/2014, 02/12/2015, 02/25/2016, 03/08/2017, 02/19/2018  . Moderna SARS-COVID-2 Vaccination 05/20/2019, 06/17/2019  . PPD Test 09/09/2015  . Pneumococcal Conjugate-13 05/05/2016  . Pneumococcal Polysaccharide-23 02/13/2013  . Td 05/05/2016  . Zoster 05/04/2016  . Zoster Recombinat (Shingrix) 03/08/2019, 03/09/2019   Pertinent  Health Maintenance Due  Topic Date Due  . MAMMOGRAM  Never done  . INFLUENZA VACCINE  Completed  . DEXA SCAN  Completed  . PNA vac Low Risk Adult  Completed   Fall Risk  04/08/2019 01/08/2018 01/02/2017 11/10/2015 10/20/2015  Falls in the past year? 0 No No No Yes  Comment Emmi Telephone Survey: data to providers prior to load - - - -  Number falls in past yr: - - - - 2 or more  Injury with Fall? - - - - Yes  Risk Factor Category  - - - - -  Risk for fall due to : - - - - History of fall(s);Impaired balance/gait  Follow up - - - - -   Functional Status Survey:    Vitals:   07/26/19 1446  BP: 110/76   Pulse: 78  Resp: 16  Temp: 97.6 F (36.4 C)  SpO2: 98%  Weight: 97 lb 12.8 oz (44.4 kg)  Height: 5\' 8"  (1.727 m)   Body mass index is 14.87 kg/m. Physical Exam Vitals and nursing note reviewed.  Constitutional:      General: She is not in acute distress.    Appearance: Normal appearance. She is not ill-appearing.  HENT:     Head: Normocephalic and atraumatic.     Nose: Nose normal.     Mouth/Throat:     Mouth: Mucous membranes are moist.  Eyes:     Extraocular Movements: Extraocular movements intact.     Conjunctiva/sclera: Conjunctivae normal.     Pupils: Pupils are equal, round, and reactive to light.  Cardiovascular:     Rate and Rhythm: Normal rate and regular rhythm.     Heart sounds: No murmur.  Pulmonary:     Effort: No respiratory distress.     Breath sounds: No rales.  Abdominal:     General: Bowel sounds are normal. There is no distension.     Palpations: Abdomen is soft.     Tenderness: There is no abdominal tenderness. There is no guarding or rebound.  Musculoskeletal:     Cervical back: Normal range of motion and neck supple.     Right lower leg: Edema present.     Left lower leg: Edema present.     Comments: Trace edema BLE.  Skin:    General: Skin is warm.  Neurological:     General: No focal deficit present.     Mental Status: She is alert and oriented to person, place, and time. Mental status is at baseline.     Cranial Nerves: No cranial nerve deficit.     Motor: No weakness.     Coordination: Coordination normal.     Gait: Gait abnormal.  Psychiatric:  Mood and Affect: Mood normal.        Behavior: Behavior normal.     Labs reviewed: Recent Labs    12/20/18 0000 05/27/19 0000 07/01/19 0000  NA 131* 129* 129*  129*  K 3.7 3.9 4.2  CL 98 95* 96*  CO2 27 29* 26*  BUN 18 18 22*  22*  CREATININE 1.0 0.9 1.0  1.0  CALCIUM 8.2 8.6* 8.6*  8.6*   Recent Labs    12/04/18 0000  AST 16  ALT 7   No results for input(s): WBC,  NEUTROABS, HGB, HCT, MCV, PLT in the last 8760 hours. Lab Results  Component Value Date   TSH 2.73 07/16/2018   No results found for: HGBA1C Lab Results  Component Value Date   CHOL 175 11/10/2014   HDL 53 11/10/2014   LDLCALC 87 11/10/2014   TRIG 173 (A) 11/10/2014    Significant Diagnostic Results in last 30 days:  No results found.  Assessment/Plan Hypothyroidism 09/01/16 TSH 9.25 09/02/16 increase Levothyroxine 185mcg qd 01/24/19 TSH 1.78 07/25/19 TSH 0.13 07/26/19 decrease Levothyroxine to 172mcg qd, TSH 8 weeks.   Advanced care planning/counseling discussion 07/26/19 MOST comfort measures, determine use of limitation of antibiotics when infection occurs, no IVF  Essential hypertension Blood pressure is controlled, continue Amlodipine, Hydralazine, Irbesartan  Chronic pain syndrome Pain is controlled, continue Tylenol, Noroc, Fentanyl.   Recurrent major depression ([place]) Her mood is stable, continue Mirtazapine.     Family/ staff Communication: plan of care reviewed with the patient and charge nurse.   Labs/tests ordered:  TSH 8 weeks  Time spend 40 minutes.

## 2019-07-26 NOTE — Assessment & Plan Note (Signed)
Blood pressure is controlled, continue Amlodipine, Hydralazine, Irbesartan

## 2019-07-26 NOTE — Assessment & Plan Note (Signed)
09/01/16 TSH 9.25 09/02/16 increase Levothyroxine 164mcg qd 01/24/19 TSH 1.78 07/25/19 TSH 0.13 07/26/19 decrease Levothyroxine to 121mcg qd, TSH 8 weeks.

## 2019-07-26 NOTE — Assessment & Plan Note (Signed)
07/26/19 MOST comfort measures, determine use of limitation of antibiotics when infection occurs, no IVF

## 2019-07-26 NOTE — Assessment & Plan Note (Signed)
Her mood is stable, continue Mirtazapine.  °

## 2019-07-26 NOTE — Assessment & Plan Note (Signed)
Pain is controlled, continue Tylenol, Noroc, Fentanyl.

## 2019-07-30 ENCOUNTER — Telehealth: Payer: Self-pay | Admitting: *Deleted

## 2019-07-30 ENCOUNTER — Other Ambulatory Visit: Payer: Self-pay | Admitting: Internal Medicine

## 2019-07-30 DIAGNOSIS — S329XXA Fracture of unspecified parts of lumbosacral spine and pelvis, initial encounter for closed fracture: Secondary | ICD-10-CM | POA: Diagnosis not present

## 2019-07-30 DIAGNOSIS — W19XXXA Unspecified fall, initial encounter: Secondary | ICD-10-CM | POA: Diagnosis not present

## 2019-07-30 DIAGNOSIS — R1311 Dysphagia, oral phase: Secondary | ICD-10-CM | POA: Diagnosis not present

## 2019-07-30 DIAGNOSIS — H9193 Unspecified hearing loss, bilateral: Secondary | ICD-10-CM

## 2019-07-30 DIAGNOSIS — N182 Chronic kidney disease, stage 2 (mild): Secondary | ICD-10-CM | POA: Diagnosis not present

## 2019-07-30 DIAGNOSIS — R55 Syncope and collapse: Secondary | ICD-10-CM | POA: Diagnosis not present

## 2019-07-30 DIAGNOSIS — R42 Dizziness and giddiness: Secondary | ICD-10-CM | POA: Diagnosis not present

## 2019-07-30 NOTE — Telephone Encounter (Signed)
It has been faxed over to  [facility] 07/30/2019 Thanks, Lattie Haw

## 2019-07-30 NOTE — Telephone Encounter (Signed)
Lattie Haw please see referral, pt has appt tomorrow.

## 2019-07-30 NOTE — Telephone Encounter (Signed)
Made the referal

## 2019-07-30 NOTE — Telephone Encounter (Signed)
Pt is scheduled for a hearing evaluation tomorrow and [facility] need a referral in order to see pt. Her appt is at 2pm 07/31/2019

## 2019-07-31 DIAGNOSIS — H906 Mixed conductive and sensorineural hearing loss, bilateral: Secondary | ICD-10-CM | POA: Diagnosis not present

## 2019-07-31 DIAGNOSIS — E039 Hypothyroidism, unspecified: Secondary | ICD-10-CM | POA: Diagnosis not present

## 2019-08-05 DIAGNOSIS — W19XXXA Unspecified fall, initial encounter: Secondary | ICD-10-CM | POA: Diagnosis not present

## 2019-08-05 DIAGNOSIS — R42 Dizziness and giddiness: Secondary | ICD-10-CM | POA: Diagnosis not present

## 2019-08-05 DIAGNOSIS — R55 Syncope and collapse: Secondary | ICD-10-CM | POA: Diagnosis not present

## 2019-08-05 DIAGNOSIS — R1311 Dysphagia, oral phase: Secondary | ICD-10-CM | POA: Diagnosis not present

## 2019-08-05 DIAGNOSIS — S329XXA Fracture of unspecified parts of lumbosacral spine and pelvis, initial encounter for closed fracture: Secondary | ICD-10-CM | POA: Diagnosis not present

## 2019-08-05 DIAGNOSIS — N182 Chronic kidney disease, stage 2 (mild): Secondary | ICD-10-CM | POA: Diagnosis not present

## 2019-08-06 DIAGNOSIS — H906 Mixed conductive and sensorineural hearing loss, bilateral: Secondary | ICD-10-CM | POA: Diagnosis not present

## 2019-08-06 DIAGNOSIS — H6122 Impacted cerumen, left ear: Secondary | ICD-10-CM | POA: Diagnosis not present

## 2019-08-08 ENCOUNTER — Emergency Department (HOSPITAL_COMMUNITY): Payer: Medicare Other

## 2019-08-08 ENCOUNTER — Inpatient Hospital Stay (HOSPITAL_COMMUNITY): Payer: Medicare Other

## 2019-08-08 ENCOUNTER — Encounter (HOSPITAL_COMMUNITY)
Admission: EM | Disposition: A | Discharge status: Skilled Nursing Facility | Payer: Self-pay | Source: Skilled Nursing Facility | Attending: Internal Medicine

## 2019-08-08 ENCOUNTER — Other Ambulatory Visit: Payer: Self-pay

## 2019-08-08 ENCOUNTER — Encounter (HOSPITAL_COMMUNITY): Payer: Self-pay | Admitting: Certified Registered"

## 2019-08-08 ENCOUNTER — Encounter (HOSPITAL_COMMUNITY): Payer: Self-pay | Admitting: Emergency Medicine

## 2019-08-08 ENCOUNTER — Telehealth: Payer: Self-pay | Admitting: Nurse Practitioner

## 2019-08-08 ENCOUNTER — Inpatient Hospital Stay (HOSPITAL_COMMUNITY)
Admission: EM | Admit: 2019-08-08 | Discharge: 2019-08-14 | DRG: 562 | Disposition: A | Discharge status: Skilled Nursing Facility | Payer: Medicare Other | Source: Skilled Nursing Facility | Attending: Family Medicine | Admitting: Family Medicine

## 2019-08-08 DIAGNOSIS — N189 Chronic kidney disease, unspecified: Secondary | ICD-10-CM | POA: Diagnosis not present

## 2019-08-08 DIAGNOSIS — S72002D Fracture of unspecified part of neck of left femur, subsequent encounter for closed fracture with routine healing: Secondary | ICD-10-CM | POA: Diagnosis not present

## 2019-08-08 DIAGNOSIS — N183 Chronic kidney disease, stage 3 unspecified: Secondary | ICD-10-CM | POA: Diagnosis present

## 2019-08-08 DIAGNOSIS — J690 Pneumonitis due to inhalation of food and vomit: Secondary | ICD-10-CM | POA: Diagnosis not present

## 2019-08-08 DIAGNOSIS — R918 Other nonspecific abnormal finding of lung field: Secondary | ICD-10-CM | POA: Diagnosis not present

## 2019-08-08 DIAGNOSIS — Z86711 Personal history of pulmonary embolism: Secondary | ICD-10-CM | POA: Diagnosis not present

## 2019-08-08 DIAGNOSIS — R64 Cachexia: Secondary | ICD-10-CM | POA: Diagnosis present

## 2019-08-08 DIAGNOSIS — Z66 Do not resuscitate: Secondary | ICD-10-CM | POA: Diagnosis not present

## 2019-08-08 DIAGNOSIS — S82832A Other fracture of upper and lower end of left fibula, initial encounter for closed fracture: Secondary | ICD-10-CM | POA: Diagnosis not present

## 2019-08-08 DIAGNOSIS — Y92091 Bathroom in other non-institutional residence as the place of occurrence of the external cause: Secondary | ICD-10-CM | POA: Diagnosis not present

## 2019-08-08 DIAGNOSIS — Z880 Allergy status to penicillin: Secondary | ICD-10-CM

## 2019-08-08 DIAGNOSIS — Y92129 Unspecified place in nursing home as the place of occurrence of the external cause: Secondary | ICD-10-CM

## 2019-08-08 DIAGNOSIS — R0902 Hypoxemia: Secondary | ICD-10-CM

## 2019-08-08 DIAGNOSIS — Z888 Allergy status to other drugs, medicaments and biological substances status: Secondary | ICD-10-CM | POA: Diagnosis not present

## 2019-08-08 DIAGNOSIS — S82852A Displaced trimalleolar fracture of left lower leg, initial encounter for closed fracture: Secondary | ICD-10-CM | POA: Diagnosis not present

## 2019-08-08 DIAGNOSIS — E860 Dehydration: Secondary | ICD-10-CM | POA: Diagnosis not present

## 2019-08-08 DIAGNOSIS — E7800 Pure hypercholesterolemia, unspecified: Secondary | ICD-10-CM | POA: Diagnosis present

## 2019-08-08 DIAGNOSIS — Z7189 Other specified counseling: Secondary | ICD-10-CM

## 2019-08-08 DIAGNOSIS — T17908A Unspecified foreign body in respiratory tract, part unspecified causing other injury, initial encounter: Secondary | ICD-10-CM | POA: Diagnosis not present

## 2019-08-08 DIAGNOSIS — E039 Hypothyroidism, unspecified: Secondary | ICD-10-CM | POA: Diagnosis not present

## 2019-08-08 DIAGNOSIS — Z681 Body mass index (BMI) 19 or less, adult: Secondary | ICD-10-CM

## 2019-08-08 DIAGNOSIS — S82892A Other fracture of left lower leg, initial encounter for closed fracture: Secondary | ICD-10-CM | POA: Diagnosis present

## 2019-08-08 DIAGNOSIS — S82392A Other fracture of lower end of left tibia, initial encounter for closed fracture: Secondary | ICD-10-CM | POA: Diagnosis not present

## 2019-08-08 DIAGNOSIS — Z9012 Acquired absence of left breast and nipple: Secondary | ICD-10-CM

## 2019-08-08 DIAGNOSIS — M1610 Unilateral primary osteoarthritis, unspecified hip: Secondary | ICD-10-CM | POA: Diagnosis present

## 2019-08-08 DIAGNOSIS — D72829 Elevated white blood cell count, unspecified: Secondary | ICD-10-CM | POA: Diagnosis not present

## 2019-08-08 DIAGNOSIS — J189 Pneumonia, unspecified organism: Secondary | ICD-10-CM

## 2019-08-08 DIAGNOSIS — M170 Bilateral primary osteoarthritis of knee: Secondary | ICD-10-CM | POA: Diagnosis present

## 2019-08-08 DIAGNOSIS — M159 Polyosteoarthritis, unspecified: Secondary | ICD-10-CM | POA: Diagnosis not present

## 2019-08-08 DIAGNOSIS — Z886 Allergy status to analgesic agent status: Secondary | ICD-10-CM

## 2019-08-08 DIAGNOSIS — Z9010 Acquired absence of unspecified breast and nipple: Secondary | ICD-10-CM

## 2019-08-08 DIAGNOSIS — Z20822 Contact with and (suspected) exposure to covid-19: Secondary | ICD-10-CM | POA: Diagnosis not present

## 2019-08-08 DIAGNOSIS — I129 Hypertensive chronic kidney disease with stage 1 through stage 4 chronic kidney disease, or unspecified chronic kidney disease: Secondary | ICD-10-CM | POA: Diagnosis present

## 2019-08-08 DIAGNOSIS — E559 Vitamin D deficiency, unspecified: Secondary | ICD-10-CM | POA: Diagnosis not present

## 2019-08-08 DIAGNOSIS — S82842A Displaced bimalleolar fracture of left lower leg, initial encounter for closed fracture: Secondary | ICD-10-CM | POA: Diagnosis not present

## 2019-08-08 DIAGNOSIS — S8262XA Displaced fracture of lateral malleolus of left fibula, initial encounter for closed fracture: Secondary | ICD-10-CM | POA: Diagnosis not present

## 2019-08-08 DIAGNOSIS — M810 Age-related osteoporosis without current pathological fracture: Secondary | ICD-10-CM | POA: Diagnosis present

## 2019-08-08 DIAGNOSIS — S299XXA Unspecified injury of thorax, initial encounter: Secondary | ICD-10-CM | POA: Diagnosis not present

## 2019-08-08 DIAGNOSIS — T148XXA Other injury of unspecified body region, initial encounter: Secondary | ICD-10-CM | POA: Diagnosis not present

## 2019-08-08 DIAGNOSIS — G894 Chronic pain syndrome: Secondary | ICD-10-CM | POA: Diagnosis present

## 2019-08-08 DIAGNOSIS — W19XXXA Unspecified fall, initial encounter: Secondary | ICD-10-CM | POA: Diagnosis not present

## 2019-08-08 DIAGNOSIS — Z515 Encounter for palliative care: Secondary | ICD-10-CM

## 2019-08-08 DIAGNOSIS — W010XXA Fall on same level from slipping, tripping and stumbling without subsequent striking against object, initial encounter: Secondary | ICD-10-CM | POA: Diagnosis present

## 2019-08-08 DIAGNOSIS — Z79890 Hormone replacement therapy (postmenopausal): Secondary | ICD-10-CM

## 2019-08-08 DIAGNOSIS — E1122 Type 2 diabetes mellitus with diabetic chronic kidney disease: Secondary | ICD-10-CM | POA: Diagnosis present

## 2019-08-08 DIAGNOSIS — K219 Gastro-esophageal reflux disease without esophagitis: Secondary | ICD-10-CM | POA: Diagnosis present

## 2019-08-08 DIAGNOSIS — R079 Chest pain, unspecified: Secondary | ICD-10-CM | POA: Diagnosis not present

## 2019-08-08 DIAGNOSIS — S82892D Other fracture of left lower leg, subsequent encounter for closed fracture with routine healing: Secondary | ICD-10-CM | POA: Diagnosis not present

## 2019-08-08 DIAGNOSIS — Z79899 Other long term (current) drug therapy: Secondary | ICD-10-CM

## 2019-08-08 DIAGNOSIS — I517 Cardiomegaly: Secondary | ICD-10-CM | POA: Diagnosis not present

## 2019-08-08 DIAGNOSIS — R52 Pain, unspecified: Secondary | ICD-10-CM | POA: Diagnosis not present

## 2019-08-08 DIAGNOSIS — N1831 Chronic kidney disease, stage 3a: Secondary | ICD-10-CM | POA: Diagnosis not present

## 2019-08-08 DIAGNOSIS — E871 Hypo-osmolality and hyponatremia: Secondary | ICD-10-CM | POA: Diagnosis not present

## 2019-08-08 DIAGNOSIS — R1314 Dysphagia, pharyngoesophageal phase: Secondary | ICD-10-CM | POA: Diagnosis present

## 2019-08-08 DIAGNOSIS — I10 Essential (primary) hypertension: Secondary | ICD-10-CM | POA: Diagnosis present

## 2019-08-08 DIAGNOSIS — E8889 Other specified metabolic disorders: Secondary | ICD-10-CM | POA: Diagnosis present

## 2019-08-08 DIAGNOSIS — S82302A Unspecified fracture of lower end of left tibia, initial encounter for closed fracture: Secondary | ICD-10-CM | POA: Diagnosis not present

## 2019-08-08 DIAGNOSIS — Z03818 Encounter for observation for suspected exposure to other biological agents ruled out: Secondary | ICD-10-CM | POA: Diagnosis not present

## 2019-08-08 DIAGNOSIS — R1310 Dysphagia, unspecified: Secondary | ICD-10-CM | POA: Diagnosis not present

## 2019-08-08 DIAGNOSIS — Z853 Personal history of malignant neoplasm of breast: Secondary | ICD-10-CM

## 2019-08-08 DIAGNOSIS — Z79891 Long term (current) use of opiate analgesic: Secondary | ICD-10-CM

## 2019-08-08 LAB — CBC
HCT: 37.7 % (ref 36.0–46.0)
Hemoglobin: 12.5 g/dL (ref 12.0–15.0)
MCH: 33.8 pg (ref 26.0–34.0)
MCHC: 33.2 g/dL (ref 30.0–36.0)
MCV: 101.9 fL — ABNORMAL HIGH (ref 80.0–100.0)
Platelets: 179 10*3/uL (ref 150–400)
RBC: 3.7 MIL/uL — ABNORMAL LOW (ref 3.87–5.11)
RDW: 12.4 % (ref 11.5–15.5)
WBC: 13.2 10*3/uL — ABNORMAL HIGH (ref 4.0–10.5)
nRBC: 0 % (ref 0.0–0.2)

## 2019-08-08 LAB — TYPE AND SCREEN
ABO/RH(D): A NEG
Antibody Screen: NEGATIVE

## 2019-08-08 LAB — RESPIRATORY PANEL BY RT PCR (FLU A&B, COVID)
Influenza A by PCR: NEGATIVE
Influenza B by PCR: NEGATIVE
SARS Coronavirus 2 by RT PCR: NEGATIVE

## 2019-08-08 LAB — ABO/RH: ABO/RH(D): A NEG

## 2019-08-08 LAB — BASIC METABOLIC PANEL
Anion gap: 13 (ref 5–15)
BUN: 19 mg/dL (ref 8–23)
CO2: 22 mmol/L (ref 22–32)
Calcium: 9.2 mg/dL (ref 8.9–10.3)
Chloride: 94 mmol/L — ABNORMAL LOW (ref 98–111)
Creatinine, Ser: 1.09 mg/dL — ABNORMAL HIGH (ref 0.44–1.00)
GFR calc Af Amer: 52 mL/min — ABNORMAL LOW (ref >60)
GFR calc non Af Amer: 45 mL/min — ABNORMAL LOW (ref >60)
Glucose, Bld: 180 mg/dL — ABNORMAL HIGH (ref 70–99)
Potassium: 4 mmol/L (ref 3.5–5.1)
Sodium: 129 mmol/L — ABNORMAL LOW (ref 135–145)

## 2019-08-08 LAB — TROPONIN I (HIGH SENSITIVITY): Troponin I (High Sensitivity): 70 ng/L — ABNORMAL HIGH (ref <18)

## 2019-08-08 LAB — TSH: TSH: 0.53 u[IU]/mL (ref 0.350–4.500)

## 2019-08-08 SURGERY — OPEN REDUCTION INTERNAL FIXATION (ORIF) ANKLE FRACTURE
Anesthesia: Choice | Site: Ankle | Laterality: Left

## 2019-08-08 MED ORDER — ROCURONIUM BROMIDE 10 MG/ML (PF) SYRINGE
PREFILLED_SYRINGE | INTRAVENOUS | Status: AC
  Filled 2019-08-08: qty 10

## 2019-08-08 MED ORDER — CHLORHEXIDINE GLUCONATE 4 % EX LIQD
60.0000 mL | Freq: Once | CUTANEOUS | Status: DC
  Filled 2019-08-08: qty 15

## 2019-08-08 MED ORDER — AMLODIPINE BESYLATE 5 MG PO TABS
5.0000 mg | ORAL_TABLET | Freq: Every day | ORAL | Status: DC
  Administered 2019-08-08 – 2019-08-10 (×3): 5 mg via ORAL
  Filled 2019-08-08 (×3): qty 1

## 2019-08-08 MED ORDER — FENTANYL CITRATE (PF) 250 MCG/5ML IJ SOLN
INTRAMUSCULAR | Status: AC
  Filled 2019-08-08: qty 5

## 2019-08-08 MED ORDER — 0.9 % SODIUM CHLORIDE (POUR BTL) OPTIME
TOPICAL | Status: DC | PRN
  Administered 2019-08-08: 1000 mL

## 2019-08-08 MED ORDER — FENTANYL 25 MCG/HR TD PT72
1.0000 | MEDICATED_PATCH | TRANSDERMAL | Status: DC
  Administered 2019-08-09 – 2019-08-12 (×2): 1 via TRANSDERMAL
  Filled 2019-08-08 (×3): qty 1

## 2019-08-08 MED ORDER — IPRATROPIUM BROMIDE 0.06 % NA SOLN
2.0000 | Freq: Two times a day (BID) | NASAL | Status: DC | PRN
  Filled 2019-08-08: qty 15

## 2019-08-08 MED ORDER — OXYCODONE HCL 5 MG PO TABS: 10.0000 mg | ORAL_TABLET | Freq: Three times a day (TID) | ORAL | Status: DC | PRN

## 2019-08-08 MED ORDER — LIDOCAINE 5 % EX PTCH
1.0000 | MEDICATED_PATCH | Freq: Every day | CUTANEOUS | Status: DC
  Administered 2019-08-09 – 2019-08-14 (×6): 1 via TRANSDERMAL
  Filled 2019-08-08 (×6): qty 1

## 2019-08-08 MED ORDER — MORPHINE SULFATE (PF) 2 MG/ML IV SOLN
1.0000 mg | INTRAVENOUS | Status: DC | PRN
  Administered 2019-08-08: 10:00:00 1 mg via INTRAVENOUS
  Filled 2019-08-08: qty 1

## 2019-08-08 MED ORDER — PSYLLIUM 95 % PO PACK
1.0000 | PACK | Freq: Every day | ORAL | Status: DC
  Administered 2019-08-10: 10:00:00 1 via ORAL
  Filled 2019-08-08 (×3): qty 1

## 2019-08-08 MED ORDER — LIDOCAINE 2% (20 MG/ML) 5 ML SYRINGE
INTRAMUSCULAR | Status: AC
  Filled 2019-08-08: qty 5

## 2019-08-08 MED ORDER — LIDOCAINE 5 % EX PTCH
1.0000 | MEDICATED_PATCH | Freq: Every day | CUTANEOUS | Status: DC
  Administered 2019-08-10 – 2019-08-14 (×5): 1 via TRANSDERMAL
  Filled 2019-08-08 (×6): qty 1

## 2019-08-08 MED ORDER — PROPOFOL 10 MG/ML IV BOLUS
INTRAVENOUS | Status: AC
  Filled 2019-08-08: qty 20

## 2019-08-08 MED ORDER — SODIUM CHLORIDE 1 G PO TABS
1.0000 g | ORAL_TABLET | Freq: Two times a day (BID) | ORAL | Status: DC
  Administered 2019-08-08 – 2019-08-10 (×2): 1 g via ORAL
  Filled 2019-08-08 (×6): qty 1

## 2019-08-08 MED ORDER — MIRTAZAPINE 15 MG PO TABS
15.0000 mg | ORAL_TABLET | Freq: Every day | ORAL | Status: DC
  Administered 2019-08-08 – 2019-08-09 (×2): 15 mg via ORAL
  Filled 2019-08-08 (×2): qty 1

## 2019-08-08 MED ORDER — LEVOTHYROXINE SODIUM 25 MCG PO TABS
125.0000 ug | ORAL_TABLET | Freq: Every day | ORAL | Status: DC
  Administered 2019-08-10: 125 ug via ORAL
  Filled 2019-08-08: qty 1

## 2019-08-08 MED ORDER — OXYCODONE HCL 5 MG PO TABS
5.0000 mg | ORAL_TABLET | Freq: Three times a day (TID) | ORAL | Status: DC
  Administered 2019-08-08 – 2019-08-10 (×5): 5 mg via ORAL
  Filled 2019-08-08 (×5): qty 1

## 2019-08-08 MED ORDER — OXYCODONE-ACETAMINOPHEN 10-325 MG PO TABS: 1.0000 | ORAL_TABLET | Freq: Three times a day (TID) | ORAL | Status: DC

## 2019-08-08 MED ORDER — SENNOSIDES-DOCUSATE SODIUM 8.6-50 MG PO TABS
1.0000 | ORAL_TABLET | Freq: Every day | ORAL | Status: DC
  Administered 2019-08-09 – 2019-08-10 (×2): 1 via ORAL
  Filled 2019-08-08 (×2): qty 1

## 2019-08-08 MED ORDER — SODIUM CHLORIDE 0.9% FLUSH
3.0000 mL | Freq: Once | INTRAVENOUS | Status: AC
  Administered 2019-08-08: 06:00:00 3 mL via INTRAVENOUS

## 2019-08-08 MED ORDER — LORATADINE 10 MG PO TABS
10.0000 mg | ORAL_TABLET | Freq: Every day | ORAL | Status: DC
  Administered 2019-08-08 – 2019-08-10 (×3): 10 mg via ORAL
  Filled 2019-08-08 (×3): qty 1

## 2019-08-08 MED ORDER — ZINC OXIDE 20 % EX OINT
1.0000 "application " | TOPICAL_OINTMENT | Freq: Three times a day (TID) | CUTANEOUS | Status: DC | PRN
  Filled 2019-08-08: qty 28.35

## 2019-08-08 MED ORDER — POLYETHYLENE GLYCOL 3350 17 G PO PACK
17.0000 g | PACK | Freq: Every day | ORAL | Status: DC
  Administered 2019-08-10: 10:00:00 17 g via ORAL
  Filled 2019-08-08 (×2): qty 1

## 2019-08-08 MED ORDER — CIPROFLOXACIN-DEXAMETHASONE 0.3-0.1 % OT SUSP
4.0000 [drp] | Freq: Two times a day (BID) | OTIC | Status: DC
  Administered 2019-08-08 – 2019-08-14 (×12): 4 [drp] via OTIC
  Filled 2019-08-08 (×2): qty 7.5

## 2019-08-08 MED ORDER — SODIUM CHLORIDE 0.9 % IV SOLN: Freq: Once | INTRAVENOUS | Status: AC

## 2019-08-08 MED ORDER — ENOXAPARIN SODIUM 40 MG/0.4ML ~~LOC~~ SOLN
40.0000 mg | SUBCUTANEOUS | Status: DC
  Administered 2019-08-08: 40 mg via SUBCUTANEOUS
  Filled 2019-08-08: qty 0.4

## 2019-08-08 MED ORDER — MORPHINE SULFATE (PF) 4 MG/ML IV SOLN
4.0000 mg | INTRAVENOUS | Status: DC | PRN
  Filled 2019-08-08: qty 1

## 2019-08-08 MED ORDER — HYDRALAZINE HCL 25 MG PO TABS: 25.0000 mg | ORAL_TABLET | Freq: Four times a day (QID) | ORAL | Status: DC | PRN

## 2019-08-08 MED ORDER — MORPHINE SULFATE (PF) 2 MG/ML IV SOLN: 0.5000 mg | INTRAVENOUS | Status: DC | PRN

## 2019-08-08 MED ORDER — PANTOPRAZOLE SODIUM 40 MG PO TBEC
40.0000 mg | DELAYED_RELEASE_TABLET | Freq: Every day | ORAL | Status: DC
  Administered 2019-08-08 – 2019-08-10 (×3): 40 mg via ORAL
  Filled 2019-08-08 (×3): qty 1

## 2019-08-08 MED ORDER — ONDANSETRON HCL 4 MG/2ML IJ SOLN
4.0000 mg | Freq: Once | INTRAMUSCULAR | Status: AC
  Administered 2019-08-08: 09:00:00 4 mg via INTRAVENOUS
  Filled 2019-08-08: qty 2

## 2019-08-08 MED ORDER — HYDROCODONE-ACETAMINOPHEN 5-325 MG PO TABS
1.0000 | ORAL_TABLET | Freq: Four times a day (QID) | ORAL | Status: DC | PRN
  Administered 2019-08-08: 1 via ORAL
  Filled 2019-08-08: qty 1

## 2019-08-08 MED ORDER — POVIDONE-IODINE 10 % EX SWAB: 2.0000 "application " | Freq: Once | CUTANEOUS | Status: DC

## 2019-08-08 MED ORDER — OXYCODONE-ACETAMINOPHEN 5-325 MG PO TABS
1.0000 | ORAL_TABLET | Freq: Three times a day (TID) | ORAL | Status: DC
  Administered 2019-08-08 – 2019-08-10 (×6): 1 via ORAL
  Filled 2019-08-08 (×6): qty 1

## 2019-08-08 MED ORDER — SUCCINYLCHOLINE CHLORIDE 200 MG/10ML IV SOSY
PREFILLED_SYRINGE | INTRAVENOUS | Status: AC
  Filled 2019-08-08: qty 10

## 2019-08-08 MED ORDER — IRBESARTAN 300 MG PO TABS
300.0000 mg | ORAL_TABLET | Freq: Every day | ORAL | Status: DC
  Administered 2019-08-08 – 2019-08-11 (×4): 300 mg via ORAL
  Filled 2019-08-08 (×4): qty 1

## 2019-08-08 MED ORDER — FENTANYL CITRATE (PF) 100 MCG/2ML IJ SOLN
50.0000 ug | Freq: Once | INTRAMUSCULAR | Status: AC
  Administered 2019-08-08: 06:00:00 50 ug via INTRAVENOUS
  Filled 2019-08-08: qty 2

## 2019-08-08 MED ORDER — CLINDAMYCIN PHOSPHATE 900 MG/50ML IV SOLN: 900.0000 mg | INTRAVENOUS | Status: AC

## 2019-08-08 MED ORDER — MENTHOL (TOPICAL ANALGESIC) 4 % EX GEL: 1.0000 "application " | Freq: Three times a day (TID) | CUTANEOUS | Status: DC | PRN

## 2019-08-08 MED ORDER — FAMOTIDINE 20 MG PO TABS
10.0000 mg | ORAL_TABLET | Freq: Every day | ORAL | Status: DC
  Administered 2019-08-08 – 2019-08-09 (×2): 10 mg via ORAL
  Filled 2019-08-08 (×2): qty 1

## 2019-08-08 MED ORDER — HYDRALAZINE HCL 25 MG PO TABS
50.0000 mg | ORAL_TABLET | Freq: Two times a day (BID) | ORAL | Status: DC
  Administered 2019-08-08 – 2019-08-11 (×4): 50 mg via ORAL
  Filled 2019-08-08 (×6): qty 2

## 2019-08-08 NOTE — Progress Notes (Signed)
Pt appears to be experinceing difficulty in swallowing- note wet cough after taking meds crushed in a thick magic cup pudding- course rhoncfhi LUL. Have not seen any urine output- bladder scan showed only 43ml- continue to monitor

## 2019-08-08 NOTE — Progress Notes (Signed)
Orthopedic Tech Progress Note Patient Details:  Bailey [name] Aug 01, 1926 [id number]  Ortho Devices Type of Ortho Device: Post (short leg) splint, Stirrup splint Ortho Device/Splint Location: LLE Ortho Device/Splint Interventions: Ordered, Application   Post Interventions Patient Tolerated: Fair Instructions Provided: Care of device   [employer] 08/08/2019, 6:11 AM

## 2019-08-08 NOTE — ED Notes (Signed)
Patient transported to x-ray. ?

## 2019-08-08 NOTE — ED Notes (Signed)
Bailey [name], son, [phone] would like an update when available (states he is POA)

## 2019-08-08 NOTE — H&P (Addendum)
History and Physical    Bailey [name] [id number] DOB: 12/27/1926 DOA: 08/08/2019  Referring MD/NP/PA: Isla Pence, MD PCP: Virgie Dad, MD  Patient coming from: [facility] via EMS  Chief Complaint: Fall  I have personally briefly reviewed patient's old medical records in [place]   HPI: Bailey [name] is a 86 y.o. female with medical history significant of essential hypertension, DM type II, CKD stage II, hypothyroidism, chronic hyponatremia, s/p mastectomy, history of PE not on anticoagulate, chronic pain due to arthritis, on chronic opioid pain medicine, and seen off p.o. porosis presents after having a fall.  History is obtained from the patient, but limited as she is hard of hearing.  Patient was using her walker when she went to use the restroom and tripped and fell.  She complained of severe pain in the left ankle and was unable to bear weight on it she was able to pull the emergency cord and staff came to help her off the floor.  Denies any loss of consciousness, trauma to her head, lightheadedness, or palpitations.  Son notes previous history of fracture of the left femur over 10 years ago that was repaired..  ED Course: On admission into the emergency department patient was noted to be mildly tachycardic, but all other vital signs within normal limits.  Labs significant for WBC 13.2, sodium 129, chloride 94, creatinine 1.09, and glucose 180.  X-rays revealed a bimalleolar fracture of the left ankle.  The ankle was splinted by the ED provider.  Orthopedics was consulted.  Patient was given 50 mcg of fentanyl.  CT the left ankle pending. TRH called to admit.  Review of Systems  Constitutional: Negative for chills, fever and malaise/fatigue.  HENT: Positive for hearing loss. Negative for congestion and nosebleeds.   Eyes: Negative for photophobia and pain.  Respiratory: Negative for cough and shortness of breath.   Cardiovascular: Negative for chest pain and  leg swelling.  Gastrointestinal: Negative for abdominal pain, nausea and vomiting.  Genitourinary: Negative for dysuria and hematuria.  Musculoskeletal: Positive for falls, joint pain and myalgias.  Skin: Negative for itching.  Neurological: Negative for focal weakness and loss of consciousness.  Psychiatric/Behavioral: Negative for substance abuse. The patient is not nervous/anxious.     Past Medical History:  Diagnosis Date  . Acquired cyst of kidney    left  . Allergic rhinitis, cause unspecified   . Anemia, unspecified   . Chronic hyponatremia 09/05/2015  . CKD stage 2 due to type 2 diabetes mellitus ([place]) 11/18/2014  . Closed fracture of unspecified part of vertebral column without mention of spinal cord injury    T7 s/p kyphoplasty, T12  . Edema 2015  . Herpes simplex without mention of complication   . History of Epstein-Barr virus infection   . Hyposmolality and/or hyponatremia    07/13/15 Na 131  . Insomnia   . Insomnia, unspecified   . Left cavernous carotid aneurysm 08/2008   1-1/53mm aneurysm of cavernous carotid artery  . Malignant neoplasm of breast (female), unspecified site 1990   left. Had surgerey and chemo, but no radiation  . 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 Neuropathy   . Osteoarthrosis of knee    bilaterally  . Osteoarthrosis, hip   . Osteoarthrosis, unspecified whether generalized or localized, forearm    bilaterally wrist  . Other and unspecified nonspecific immunological findings    positive ANA  .  Other B-complex deficiencies   . Pain in joint, site unspecified    severe, diffuse, chronic pain  . Positive ANA (antinuclear antibody)   . Pulmonary embolism ([place]) 05/07/2015  . Pure hypercholesterolemia   . Senile osteoporosis   . Unspecified essential hypertension   . Unspecified gastritis and gastroduodenitis without mention of hemorrhage   . Unspecified hypothyroidism   . Unspecified vitamin D  deficiency     Past Surgical History:  Procedure Laterality Date  . BREAST SURGERY Left   . DILATATION & CURETTAGE/HYSTEROSCOPY WITH MYOSURE N/A 06/27/2017   Procedure: DILATATION & CURETTAGE/HYSTEROSCOPY WITH MYOSURE;  Surgeon: Servando Salina, MD;  Location: [facility];  Service: Gynecology;  Laterality: N/A;  . DILATION AND CURETTAGE OF UTERUS  X 2  . FEMUR IM NAIL Right 03/18/2015   Procedure: INTRAMEDULLARY (IM) NAIL FEMORAL;  Surgeon: Rod Can, MD;  Location: [facility];  Service: Orthopedics;  Laterality: Right;  . FRACTURE SURGERY     hip  . KYPHOPLASTY  07/03/2010   T12  . KYPHOPLASTY     C7  . LAPAROSCOPIC CHOLECYSTECTOMY  09/20/2006  . MASTECTOMY Left 04/29/1989  . MIDDLE EAR SURGERY Bilateral 1938  . NASAL SINUS SURGERY  1990 & 2000  . ORIF HIP FRACTURE Left 06/13/2007   following a syncopal episode  . TONSILLECTOMY  1968     reports that she has never smoked. She has never used smokeless tobacco. She reports current alcohol use. She reports that she does not use drugs.  Allergies  Allergen Reactions  . Aspirin     Drop in body temp with large quantities, can tolerate low doses of aspirin  . Hctz [Hydrochlorothiazide] Other (See Comments)    Hyponatremia  . Penicillins Swelling    Has patient had a PCN reaction causing immediate rash, facial/tongue/throat swelling, SOB or lightheadedness with hypotension: No Has patient had a PCN reaction causing severe rash involving mucus membranes or skin necrosis: No Has patient had a PCN reaction that required hospitalization No Has patient had a PCN reaction occurring within the last 10 years: No If all of the above answers are "NO", then may proceed with Cephalosporin use.    No significant family history reported  Prior to Admission medications   Medication Sig Start Date End Date Taking? Authorizing Provider  acetaminophen (TYLENOL) 500 MG tablet Take 1,000 mg by mouth daily.     [provider]  acetaminophen  (TYLENOL) 500 MG tablet Take 500 mg by mouth every morning.     [provider]  amLODipine (NORVASC) 5 MG tablet Take 5 mg by mouth daily.    [provider]  famotidine (PEPCID) 10 MG tablet Take 10 mg by mouth at bedtime.    [provider]  feeding supplement (BOOST / RESOURCE BREEZE) LIQD Take 1 Container by mouth 2 (two) times daily between meals. (1000 & 1600) Prefers Chocolate    [provider]  fentaNYL (DURAGESIC) 25 MCG/HR Place 1 patch onto the skin every 3 (three) days.    [provider]  hydrALAZINE (APRESOLINE) 100 MG tablet Take 100 mg by mouth daily. If SBP >160 recheck B/P and document in vital sign record.     [provider]  hydrALAZINE (APRESOLINE) 50 MG tablet Take 50 mg by mouth 2 (two) times daily.    [provider]  ipratropium (ATROVENT) 0.06 % nasal spray Place 2 sprays into both nostrils 2 (two) times daily as needed for rhinitis. PRN-NASAL DRAINAGE    [provider]  irbesartan (AVAPRO) 300 MG tablet Take 300 mg by mouth daily. HOLD for B/P [id number], If systolic B/P is over 0000000, recheck B/P, document in Vital Signs.    [provider]  levothyroxine (SYNTHROID, LEVOTHROID) 150 MCG tablet Take 150 mcg by mouth daily before breakfast. (0730)    [provider]  Lidocaine 4 % PTCH Apply topically. Apply one patch topically to left hip every morning and remove every evening; resident may self apply; keep on medcart Twice A Day    [provider]  Lidocaine 4 % PTCH Apply topically. Apply one patch topically to right shoulder every morning and remove every evening; resident may self apply; keep on med cart Twice A Day    [provider]  Lidocaine 4 % PTCH Apply topically. Apply one patch topically to left knee every morning and remove every evening; resident may self apply; keep on med cart Twice A Day    [provider]  loratadine (CLARITIN) 10 MG tablet  Take 1 tablet (10 mg total) by mouth daily. 06/19/17   Blanchie Serve, MD  Menthol, Topical Analgesic, (BIOFREEZE) 4 % GEL Apply 1 application topically 3 (three) times daily as needed. Knee and hand pain    [provider]  Menthol, Topical Analgesic, (BIOFREEZE) 4 % GEL Apply to bilateral knees for pain may self administer may keep at bedside    [provider]  mirtazapine (REMERON) 30 MG tablet Take 30 mg by mouth at bedtime.    [provider]  ondansetron (ZOFRAN) 4 MG tablet Take 4 mg by mouth every 8 (eight) hours as needed for nausea or vomiting.    [provider]  Oxycodone HCl 10 MG TABS Take 10 mg by mouth every 8 (eight) hours as needed.    [provider]  oxyCODONE-acetaminophen (PERCOCET) 10-325 MG tablet Take 1 tablet by mouth 3 (three) times daily.    [provider]  pantoprazole (PROTONIX) 40 MG tablet Take 40 mg by mouth daily.    [provider]  polyethylene glycol (MIRALAX / GLYCOLAX) packet Take 17 g by mouth daily.    [provider]  promethazine (PHENERGAN) 25 MG suppository Place 25 mg rectally every 6 (six) hours as needed for nausea or vomiting. nausea WITH vomiting x 24 hours. Clear liquid diet x24 hours. If NOT effective, notify MD.    [provider]  psyllium (METAMUCIL) 58.6 % powder Take 1 packet by mouth daily. Mix in 6 oz of water or juice    [provider]  sennosides-docusate sodium (SENOKOT-S) 8.6-50 MG tablet Take 1 tablet by mouth daily. Hold for loose stool    [provider]  sodium chloride 1 g tablet Take 1 g by mouth 2 (two) times daily.     [provider]  Wound Dressings (GRX HYDROCOLLOID DRESSING EX) Apply topically. Change dressing to coccyx as needed    [provider]  zinc oxide 20 % ointment Apply 1 application topically 3 (three) times daily as needed for irritation. Apply to buttocks for redness/dark pink/excoriation     [provider]  Zoster Vaccine Adjuvanted [facility]) injection Inject 0.5 mLs into the muscle once. Shingrix 0.5 ml IM x 1 repeated in 6 months Once - One Time on 09/09/2019 09/09/19 09/09/19  [provider]    Physical Exam:  Constitutional: Elderly female in NAD, calm, comfortable Vitals:   08/08/19 0354  BP: 117/67  Pulse: (!) 106  Resp: 18  Temp: 98.1 F (36.7 C)  TempSrc: Oral  SpO2: 94%   Eyes: PERRL, lids and conjunctivae normal ENMT: Mucous membranes are moist. Posterior pharynx clear of any exudate or lesions.very hard of hearing. Neck: normal, supple, no masses, no thyromegaly Respiratory: clear to auscultation bilaterally, no wheezing, no crackles. Normal respiratory effort. No accessory muscle use.  Cardiovascular: Mildly tachycardic, no murmurs / rubs / gallops. No extremity edema. 2+ pedal pulses. No carotid bruits.  Abdomen: no tenderness, no masses palpated. No hepatosplenomegaly. Bowel sounds positive.  Musculoskeletal: no clubbing / cyanosis.  Cast of the left ankle present. Skin: no rashes, lesions, ulcers. No induration Neurologic: CN 2-12 grossly intact. Sensation intact, DTR normal. Strength 5/5 in all 4.  Psychiatric: Normal judgment and insight. Alert and oriented x 3. Normal mood.     Labs on Admission: I have personally reviewed following labs and imaging studies  CBC: Recent Labs  Lab 08/08/19 0459  WBC 13.2*  HGB 12.5  HCT 37.7  MCV 101.9*  PLT 0000000   Basic Metabolic Panel: Recent Labs  Lab 08/08/19 0459  NA 129*  K 4.0  CL 94*  CO2 22  GLUCOSE 180*  BUN 19  CREATININE 1.09*  CALCIUM 9.2   GFR: Estimated Creatinine Clearance: 24 mL/min (A) (by C-G formula based on SCr of 1.09 mg/dL (H)). Liver Function Tests: No results for input(s): AST, ALT, ALKPHOS, BILITOT, PROT, ALBUMIN in the last 168 hours. No results for input(s): LIPASE, AMYLASE in the last 168 hours. No results for input(s): AMMONIA in the last 168  hours. Coagulation Profile: No results for input(s): INR, PROTIME in the last 168 hours. Cardiac Enzymes: No results for input(s): CKTOTAL, CKMB, CKMBINDEX, TROPONINI in the last 168 hours. BNP (last 3 results) No results for input(s): PROBNP in the last 8760 hours. HbA1C: No results for input(s): HGBA1C in the last 72 hours. CBG: No results for input(s): GLUCAP in the last 168 hours. Lipid Profile: No results for input(s): CHOL, HDL, LDLCALC, TRIG, CHOLHDL, LDLDIRECT in the last 72 hours. Thyroid Function Tests: No results for input(s): TSH, T4TOTAL, FREET4, T3FREE, THYROIDAB in the last 72 hours. Anemia Panel: No results for input(s): VITAMINB12, FOLATE, FERRITIN, TIBC, IRON, RETICCTPCT in the last 72 hours. Urine analysis:    Component Value Date/Time   COLORURINE STRAW (A) 03/30/2017 0631   APPEARANCEUR CLEAR 03/30/2017 0631   LABSPEC 1.004 (L) 03/30/2017 0631   PHURINE 9.0 (H) 03/30/2017 0631   GLUCOSEU NEGATIVE 03/30/2017 0631   HGBUR NEGATIVE 03/30/2017 0631   BILIRUBINUR NEGATIVE 03/30/2017 0631   KETONESUR NEGATIVE 03/30/2017 0631   PROTEINUR NEGATIVE 03/30/2017 0631   UROBILINOGEN 1.0 03/22/2015 1200   NITRITE NEGATIVE 03/30/2017 0631   LEUKOCYTESUR NEGATIVE 03/30/2017 0631   Sepsis Labs: No results found for this or any previous visit (from the past 240 hour(s)).   Radiological Exams on Admission: DG Chest 2 View  Result Date: 08/08/2019 CLINICAL DATA:  Chest pain after mechanical fall EXAM: CHEST - 2 VIEW COMPARISON:  March 30, 2017 FINDINGS: The heart size and mediastinal contours are within normal limits. Aortic knob calcifications. There is slight hyperinflation of the lung zones. There is cement fixation seen within the mid and lower thoracic spine. Chronic appearing compression deformities of the midthoracic spine are seen with greater than 50% loss in vertebral body height. IMPRESSION: No active cardiopulmonary disease. Electronically Signed   By: Prudencio Pair M.D.   On: 08/08/2019 05:19   DG Ankle Complete Left  Result Date: 08/08/2019  CLINICAL DATA:  Fall and pain EXAM: LEFT ANKLE COMPLETE - 3+ VIEW COMPARISON:  None. FINDINGS: There is diffuse osteopenia. Mildly impacted fracture of the distal tibial metadiaphysis with intra-articular extension seen anteriorly on the lateral view. There is also a slightly impacted nondisplaced fracture of the distal fibula at the level of the ankle mortise. Osteoarthritis of the midfoot is seen. There is diffuse overlying soft tissue swelling. IMPRESSION: Impacted distal tibial fracture with intra-articular extension seen anteriorly. Nondisplaced impacted distal fibular fracture. Overlying soft tissue swelling. Electronically Signed   By: Prudencio Pair M.D.   On: 08/08/2019 04:25   DG Femur Portable Min 2 Views Left  Result Date: 08/08/2019 CLINICAL DATA:  Pain after fall EXAM: LEFT FEMUR PORTABLE 2 VIEWS COMPARISON:  None. FINDINGS: The patient is status post ORIF with healed fracture deformity of the proximal femur. There is diffuse osteopenia. No periprosthetic lucency or acute fracture seen. There is dense vascular calcifications. Mild overlying soft tissue swelling seen at the left hip. IMPRESSION: Status post ORIF of the left proximal femur. No acute osseous abnormality. Electronically Signed   By: Prudencio Pair M.D.   On: 08/08/2019 05:56    EKG: Independently reviewed.  Appears to be sinus tachycardia at 116 bpm with PVC, but significant artifact noted  Assessment/Plan Left ankle fracture secondary to fall: Acute.  Patient presents after having a mechanical fall using the restroom.  Found to have a bimalleolar left ankle fracture.  The PDX consulted and plan taking for surgical intervention later today. -Admit to a medical telemetry bed -Utilized hip fracture order set -N.p.o. for surgical intervention -Pain control  -Transitions of care consult  Leukocytosis: Acute.  WBC elevated at 13.2.  Suspect that  this is secondary to neutrophil marginalization related to the fracture. -Continue to monitor  Hyponatremia: Chronic.  Sodium 129 which appears to be near her baseline which ranges from 129-131.  She takes sodium chloride tablets 1 g twice daily -Continue sodium chloride tablets  Chronic pain on chronic opioids: Patient with long history of chronic pain due to arthritis.  Home medications include fentanyl patch 25 mcg, Percocet10-325mg  3 times daily, oxycodone 10 mg every 8 hours as needed for moderate pain and lidocaine patches. -Continue current pain regimen and stool softeners  Essential hypertension: Blood pressures initially elevated up to 172/136.  Home medications include amlodipine 5 mg daily, hydralazine 50 mg twice daily, and irbesartan 300 mg daily. -Continue current home regimen  - p.o. hydralazine as needed elevated blood pressure  Chronic kidney disease stage IIIa: Patient creatinine on admission 1.09.  Baseline creatinine appears to be around 0.9-1. -Continue to monitor  Diabetes mellitus type 2: On admission glucose elevated up to 180.  Patient appears to be diet controlled and is not on any medications for treatment.  No recent hemoglobin A1c on file.  Would suspect that this is related with acute stress response. -Continue to monitor and consider placing on sliding scale insulin if needed  Hypothyroidism: Last TSH noted to be 2.73 on 07/16/2018. -Add on TSH -Continue levothyroxine  Osteoporosis: Patient with previous fracture of the left femur over 10 years ago.  GERD Home medications include Protonix 40 mg daily and Pepcid 10 mg nightly -Continue home regimen  History of breast cancer: Patient status post left mastectomy.  History of PE: Patient not on anticoagulation.  DNR: Present on admission.  Patient also has MOST form.  DVT prophylaxis: Lovenox Code Status: DNR Family Communication: Son updated at bedside Disposition Plan: Likely to  SNF/rehab once patient  has undergone surgical correction Consults called: Orthopedic Admission status: Inpatient  Norval Morton MD [facility] Pager [phone]   If 7PM-7AM, please contact night-coverage [email or web address] Password [facility]  08/08/2019, 8:59 AM

## 2019-08-08 NOTE — Progress Notes (Signed)
Orthopaedic Trauma Service Progress Note  Bailey [name] seen and evaluated  CT scan has been reviewed  Son indicates that Bailey [name] spends most of her time in the recliner.  Uses a walker or wheelchair but sounds like her ambulation is minimal.  She eats her meals in her room most of the time  Fracture has subacute appearance to it along with very osteoporotic bone Overall alignment is acceptable  Discussed with Bailey [name] and Son,  Will pursue non-op treatment Surgery would be quite invasive including need for osteotomy of osteoporotic bone.  As the fracture does not extend to the articular surface we do not think the benefit of surgery outweighs the risk   Minimal to no swelling present at L ankle  Will place into short leg cast Bailey [name] will be NWB x 6 weeks L leg Cast x 4 then conversion to CAM   Bailey [name] and son are agreeable to [place], PA-C [phone] ([phone]) 08/08/2019, 12:50 PM  [facility] [place] [address] [phone] Domingo Sep (F)

## 2019-08-08 NOTE — Consult Note (Signed)
Reason for Consult:Left ankle fx Referring Physician: [name] is an 86 y.o. female.  HPI: Bailey [name] was at the SNF where she resides while using the bathroom. She had immediate left ankle pain and could not bear weight. She was brought to the ED where x-rays showed a left ankle fx and orthopedic surgery was consulted. She c/o localized pain to the area. She is extremely HoH and visit conducted with aid of son at the bedside.  Past Medical History:  Diagnosis Date  . Acquired cyst of kidney    left  . Allergic rhinitis, cause unspecified   . Anemia, unspecified   . Chronic hyponatremia 09/05/2015  . CKD stage 2 due to type 2 diabetes mellitus ([place]) 11/18/2014  . Closed fracture of unspecified part of vertebral column without mention of spinal cord injury    T7 s/p kyphoplasty, T12  . Edema 2015  . Herpes simplex without mention of complication   . History of Epstein-Barr virus infection   . Hyposmolality and/or hyponatremia    07/13/15 Na 131  . Insomnia   . Insomnia, unspecified   . Left cavernous carotid aneurysm 08/2008   1-1/7mm aneurysm of cavernous carotid artery  . Malignant neoplasm of breast (female), unspecified site 1990   left. Had surgerey and chemo, but no radiation  . 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 Neuropathy   . Osteoarthrosis of knee    bilaterally  . Osteoarthrosis, hip   . Osteoarthrosis, unspecified whether generalized or localized, forearm    bilaterally wrist  . Other and unspecified nonspecific immunological findings    positive ANA  . Other B-complex deficiencies   . Pain in joint, site unspecified    severe, diffuse, chronic pain  . Positive ANA (antinuclear antibody)   . Pulmonary embolism ([place]) 05/07/2015  . Pure hypercholesterolemia   . Senile osteoporosis   . Unspecified essential hypertension   . Unspecified gastritis and gastroduodenitis without mention of hemorrhage   . Unspecified  hypothyroidism   . Unspecified vitamin D deficiency     Past Surgical History:  Procedure Laterality Date  . BREAST SURGERY Left   . DILATATION & CURETTAGE/HYSTEROSCOPY WITH MYOSURE N/A 06/27/2017   Procedure: DILATATION & CURETTAGE/HYSTEROSCOPY WITH MYOSURE;  Surgeon: Servando Salina, MD;  Location: [facility];  Service: Gynecology;  Laterality: N/A;  . DILATION AND CURETTAGE OF UTERUS  X 2  . FEMUR IM NAIL Right 03/18/2015   Procedure: INTRAMEDULLARY (IM) NAIL FEMORAL;  Surgeon: Rod Can, MD;  Location: [facility];  Service: Orthopedics;  Laterality: Right;  . FRACTURE SURGERY     hip  . KYPHOPLASTY  07/03/2010   T12  . KYPHOPLASTY     C7  . LAPAROSCOPIC CHOLECYSTECTOMY  09/20/2006  . MASTECTOMY Left 04/29/1989  . MIDDLE EAR SURGERY Bilateral 1938  . NASAL SINUS SURGERY  1990 & 2000  . ORIF HIP FRACTURE Left 06/13/2007   following a syncopal episode  . TONSILLECTOMY  1968    No family history on file.  Social History:  reports that she has never smoked. She has never used smokeless tobacco. She reports current alcohol use. She reports that she does not use drugs.  Allergies:  Allergies  Allergen Reactions  . Aspirin     Drop in body temp with large quantities, can tolerate low doses of aspirin  . Hctz [Hydrochlorothiazide] Other (See Comments)    Hyponatremia  . Penicillins Swelling  Has patient had a PCN reaction causing immediate rash, facial/tongue/throat swelling, SOB or lightheadedness with hypotension: No Has patient had a PCN reaction causing severe rash involving mucus membranes or skin necrosis: No Has patient had a PCN reaction that required hospitalization No Has patient had a PCN reaction occurring within the last 10 years: No If all of the above answers are "NO", then may proceed with Cephalosporin use.    Medications: I have reviewed the patient's current medications.  Results for orders placed or performed during the hospital encounter of 08/08/19 (from  the past 48 hour(s))  Basic metabolic panel     Status: Abnormal   Collection Time: 08/08/19  4:59 AM  Result Value Ref Range   Sodium 129 (L) 135 - 145 mmol/L   Potassium 4.0 3.5 - 5.1 mmol/L   Chloride 94 (L) 98 - 111 mmol/L   CO2 22 22 - 32 mmol/L   Glucose, Bld 180 (H) 70 - 99 mg/dL    Comment: Glucose reference range applies only to samples taken after fasting for at least 8 hours.   BUN 19 8 - 23 mg/dL   Creatinine, Ser 1.09 (H) 0.44 - 1.00 mg/dL   Calcium 9.2 8.9 - 10.3 mg/dL   GFR calc non Af Amer 45 (L) >60 mL/min   GFR calc Af Amer 52 (L) >60 mL/min   Anion gap 13 5 - 15    Comment: Performed at [facility] [address] [place], [place] [address]  CBC     Status: Abnormal   Collection Time: 08/08/19  4:59 AM  Result Value Ref Range   WBC 13.2 (H) 4.0 - 10.5 K/uL   RBC 3.70 (L) 3.87 - 5.11 MIL/uL   Hemoglobin 12.5 12.0 - 15.0 g/dL   HCT 37.7 36.0 - 46.0 %   MCV 101.9 (H) 80.0 - 100.0 fL   MCH 33.8 26.0 - 34.0 pg   MCHC 33.2 30.0 - 36.0 g/dL   RDW 12.4 11.5 - 15.5 %   Platelets 179 150 - 400 K/uL   nRBC 0.0 0.0 - 0.2 %    Comment: Performed at [facility], [place] [address], [place], [place] [address]  Troponin I (High Sensitivity)     Status: Abnormal   Collection Time: 08/08/19  4:59 AM  Result Value Ref Range   Troponin I (High Sensitivity) 70 (H) <18 ng/L    Comment: (NOTE) Elevated high sensitivity troponin I (hsTnI) values and significant  changes across serial measurements may suggest ACS but many other  chronic and acute conditions are known to elevate hsTnI results.  Refer to the "Links" section for chest pain algorithms and additional  guidance. Performed at [facility], [place] [address], [place], [place] [address]     DG Chest 2 View  Result Date: 08/08/2019 CLINICAL DATA:  Chest pain after mechanical fall EXAM: CHEST - 2 VIEW COMPARISON:  March 30, 2017 FINDINGS: The heart size and mediastinal contours are within normal  limits. Aortic knob calcifications. There is slight hyperinflation of the lung zones. There is cement fixation seen within the mid and lower thoracic spine. Chronic appearing compression deformities of the midthoracic spine are seen with greater than 50% loss in vertebral body height. IMPRESSION: No active cardiopulmonary disease. Electronically Signed   By: Prudencio Pair M.D.   On: 08/08/2019 05:19   DG Ankle Complete Left  Result Date: 08/08/2019 CLINICAL DATA:  Fall and pain EXAM: LEFT ANKLE COMPLETE -  3+ VIEW COMPARISON:  None. FINDINGS: There is diffuse osteopenia. Mildly impacted fracture of the distal tibial metadiaphysis with intra-articular extension seen anteriorly on the lateral view. There is also a slightly impacted nondisplaced fracture of the distal fibula at the level of the ankle mortise. Osteoarthritis of the midfoot is seen. There is diffuse overlying soft tissue swelling. IMPRESSION: Impacted distal tibial fracture with intra-articular extension seen anteriorly. Nondisplaced impacted distal fibular fracture. Overlying soft tissue swelling. Electronically Signed   By: Prudencio Pair M.D.   On: 08/08/2019 04:25   DG Femur Portable Min 2 Views Left  Result Date: 08/08/2019 CLINICAL DATA:  Pain after fall EXAM: LEFT FEMUR PORTABLE 2 VIEWS COMPARISON:  None. FINDINGS: The patient is status post ORIF with healed fracture deformity of the proximal femur. There is diffuse osteopenia. No periprosthetic lucency or acute fracture seen. There is dense vascular calcifications. Mild overlying soft tissue swelling seen at the left hip. IMPRESSION: Status post ORIF of the left proximal femur. No acute osseous abnormality. Electronically Signed   By: Prudencio Pair M.D.   On: 08/08/2019 05:56    Review of Systems  HENT: Negative for ear discharge, ear pain, hearing loss and tinnitus.   Eyes: Negative for photophobia and pain.  Respiratory: Negative for cough and shortness of breath.   Cardiovascular:  Negative for chest pain.  Gastrointestinal: Negative for abdominal pain, nausea and vomiting.  Genitourinary: Negative for dysuria, flank pain, frequency and urgency.  Musculoskeletal: Positive for arthralgias (Left ankle). Negative for back pain, myalgias and neck pain.  Neurological: Negative for dizziness and headaches.  Hematological: Does not bruise/bleed easily.  Psychiatric/Behavioral: The patient is not nervous/anxious.    Blood pressure 117/67, pulse (!) 106, temperature 98.1 F (36.7 C), temperature source Oral, resp. rate 18, SpO2 94 %. Physical Exam  Constitutional: She appears well-developed and well-nourished. No distress.  HENT:  Head: Normocephalic and atraumatic.  Eyes: Conjunctivae are normal. Right eye exhibits no discharge. Left eye exhibits no discharge. No scleral icterus.  Cardiovascular: Normal rate and regular rhythm.  Respiratory: Effort normal. No respiratory distress.  Musculoskeletal:     Cervical back: Normal range of motion.     Comments: LLE No traumatic wounds, ecchymosis, or rash  In splint  No knee effusion  Knee stable to varus/ valgus and anterior/posterior stress  Sens DPN, SPN, TN grossly intact  Motor EHL 5/5  DP 1+, No significant edema  Neurological: She is alert.  Skin: Skin is warm and dry. She is not diaphoretic.  Psychiatric: She has a normal mood and affect. Her behavior is normal.    Assessment/Plan: Left ankle fx -- Will get CT scan for surgical planning. Plan ORIF later today if pt can be cleared by medicine. Otherwise can look to repair tomorrow. Multiple medical problems including chronic pain, HTN, CKD, and thyroid disease -- Have asked medicine to admit, manage, and clear. Pt hyponatremic but appears chronically low. Also tachycardic and mildly hypoxic. Will wait and see what medicine says.    Lisette Abu, PA-C Orthopedic Surgery [phone] 08/08/2019, 8:34 AM

## 2019-08-08 NOTE — ED Notes (Signed)
ED TO INPATIENT HANDOFF REPORT  ED Nurse Name and Phone #: Percell Locus, RN  S Name/Age/Gender Bailey [name] 86 y.o. female Room/Bed: 011C/011C  Code Status   Code Status: DNR  Home/SNF/Other Nursing Home Patient oriented to: self, place and situation Is this baseline? Yes   Triage Complete: Triage complete  Chief Complaint Closed left ankle fracture [S82.892A]  Triage Note [facility] after a mechanical fall while in the restroom. Pt states she feels like her leg gave out and she fell onto the floor, she pulled the emergency cord and staff came and helped her off the floor. Denies LOC and recalls entire event. Pain and swelling to L ankle. Pt a/ox4 but is repetitive at times- which staff at facility stated was baseline.     Allergies Allergies  Allergen Reactions  . Aspirin     Drop in body temp with large quantities, can tolerate low doses of aspirin  . Hctz [Hydrochlorothiazide] Other (See Comments)    Hyponatremia  . Penicillins Swelling    Has patient had a PCN reaction causing immediate rash, facial/tongue/throat swelling, SOB or lightheadedness with hypotension: No Has patient had a PCN reaction causing severe rash involving mucus membranes or skin necrosis: No Has patient had a PCN reaction that required hospitalization No Has patient had a PCN reaction occurring within the last 10 years: No If all of the above answers are "NO", then may proceed with Cephalosporin use.    Level of Care/Admitting Diagnosis ED Disposition    ED Disposition Condition [facility] Area: [place] [[id number]]  Level of Care: Telemetry Medical [104]  May admit patient to [facility] or [facility] if equivalent level of care is available:: No  Covid Evaluation: Asymptomatic Screening Protocol (No Symptoms)  Diagnosis: Closed left ankle fracture [id number]  Admitting Physician: Norval Morton [id number]  Attending  Physician: Norval Morton [id number]  Estimated length of stay: past midnight tomorrow  Certification:: I certify this patient will need inpatient services for at least 2 midnights       B Medical/Surgery History Past Medical History:  Diagnosis Date  . Acquired cyst of kidney    left  . Allergic rhinitis, cause unspecified   . Anemia, unspecified   . Chronic hyponatremia 09/05/2015  . CKD stage 2 due to type 2 diabetes mellitus ([place]) 11/18/2014  . Closed fracture of unspecified part of vertebral column without mention of spinal cord injury    T7 s/p kyphoplasty, T12  . Edema 2015  . Herpes simplex without mention of complication   . History of Epstein-Barr virus infection   . Hyposmolality and/or hyponatremia    07/13/15 Na 131  . Insomnia   . Insomnia, unspecified   . Left cavernous carotid aneurysm 08/2008   1-1/50mm aneurysm of cavernous carotid artery  . Malignant neoplasm of breast (female), unspecified site 1990   left. Had surgerey and chemo, but no radiation  . 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 Neuropathy   . Osteoarthrosis of knee    bilaterally  . Osteoarthrosis, hip   . Osteoarthrosis, unspecified whether generalized or localized, forearm    bilaterally wrist  . Other and unspecified nonspecific immunological findings    positive ANA  . Other B-complex deficiencies   . Pain in joint, site unspecified    severe, diffuse, chronic pain  . Positive ANA (antinuclear  antibody)   . Pulmonary embolism ([place]) 05/07/2015  . Pure hypercholesterolemia   . Senile osteoporosis   . Unspecified essential hypertension   . Unspecified gastritis and gastroduodenitis without mention of hemorrhage   . Unspecified hypothyroidism   . Unspecified vitamin D deficiency    Past Surgical History:  Procedure Laterality Date  . BREAST SURGERY Left   . DILATATION & CURETTAGE/HYSTEROSCOPY WITH MYOSURE N/A 06/27/2017   Procedure:  DILATATION & CURETTAGE/HYSTEROSCOPY WITH MYOSURE;  Surgeon: Servando Salina, MD;  Location: [facility];  Service: Gynecology;  Laterality: N/A;  . DILATION AND CURETTAGE OF UTERUS  X 2  . FEMUR IM NAIL Right 03/18/2015   Procedure: INTRAMEDULLARY (IM) NAIL FEMORAL;  Surgeon: Rod Can, MD;  Location: [facility];  Service: Orthopedics;  Laterality: Right;  . FRACTURE SURGERY     hip  . KYPHOPLASTY  07/03/2010   T12  . KYPHOPLASTY     C7  . LAPAROSCOPIC CHOLECYSTECTOMY  09/20/2006  . MASTECTOMY Left 04/29/1989  . MIDDLE EAR SURGERY Bilateral 1938  . NASAL SINUS SURGERY  1990 & 2000  . ORIF HIP FRACTURE Left 06/13/2007   following a syncopal episode  . TONSILLECTOMY  1968     A IV Location/Drains/Wounds Patient Lines/Drains/Airways Status   Active Line/Drains/Airways    Name:   Placement date:   Placement time:   Site:   Days:   Peripheral IV 08/08/19 Right Forearm   08/08/19    0522    Forearm   less than 1   Incision (Closed) 06/27/17 Vagina Other (Comment)   06/27/17    1247     772          Intake/Output Last 24 hours No intake or output data in the 24 hours ending 08/08/19 1039  Labs/Imaging Results for orders placed or performed during the hospital encounter of 08/08/19 (from the past 48 hour(s))  Basic metabolic panel     Status: Abnormal   Collection Time: 08/08/19  4:59 AM  Result Value Ref Range   Sodium 129 (L) 135 - 145 mmol/L   Potassium 4.0 3.5 - 5.1 mmol/L   Chloride 94 (L) 98 - 111 mmol/L   CO2 22 22 - 32 mmol/L   Glucose, Bld 180 (H) 70 - 99 mg/dL    Comment: Glucose reference range applies only to samples taken after fasting for at least 8 hours.   BUN 19 8 - 23 mg/dL   Creatinine, Ser 1.09 (H) 0.44 - 1.00 mg/dL   Calcium 9.2 8.9 - 10.3 mg/dL   GFR calc non Af Amer 45 (L) >60 mL/min   GFR calc Af Amer 52 (L) >60 mL/min   Anion gap 13 5 - 15    Comment: Performed at [facility] [address], [place], [place] [address]  CBC     Status: Abnormal    Collection Time: 08/08/19  4:59 AM  Result Value Ref Range   WBC 13.2 (H) 4.0 - 10.5 K/uL   RBC 3.70 (L) 3.87 - 5.11 MIL/uL   Hemoglobin 12.5 12.0 - 15.0 g/dL   HCT 37.7 36.0 - 46.0 %   MCV 101.9 (H) 80.0 - 100.0 fL   MCH 33.8 26.0 - 34.0 pg   MCHC 33.2 30.0 - 36.0 g/dL   RDW 12.4 11.5 - 15.5 %   Platelets 179 150 - 400 K/uL   nRBC 0.0 0.0 - 0.2 %    Comment: Performed at [facility], [place] [address],  [place], [place] [address]  Troponin I (High Sensitivity)     Status: Abnormal   Collection Time: 08/08/19  4:59 AM  Result Value Ref Range   Troponin I (High Sensitivity) 70 (H) <18 ng/L    Comment: (NOTE) Elevated high sensitivity troponin I (hsTnI) values and significant  changes across serial measurements may suggest ACS but many other  chronic and acute conditions are known to elevate hsTnI results.  Refer to the "Links" section for chest pain algorithms and additional  guidance. Performed at [facility], [place] [address], [place], [place] [address]   Type and screen [place]     Status: None (Preliminary result)   Collection Time: 08/08/19  9:54 AM  Result Value Ref Range   ABO/RH(D) PENDING    Antibody Screen PENDING    Sample Expiration      08/11/2019,2359 Performed at [facility], [place] [address], [place], [place] [address]    DG Chest 2 View  Result Date: 08/08/2019 CLINICAL DATA:  Chest pain after mechanical fall EXAM: CHEST - 2 VIEW COMPARISON:  March 30, 2017 FINDINGS: The heart size and mediastinal contours are within normal limits. Aortic knob calcifications. There is slight hyperinflation of the lung zones. There is cement fixation seen within the mid and lower thoracic spine. Chronic appearing compression deformities of the midthoracic spine are seen with greater than 50% loss in vertebral body height. IMPRESSION: No active cardiopulmonary disease. Electronically Signed   By: Prudencio Pair M.D.   On: 08/08/2019 05:19    DG Ankle Complete Left  Result Date: 08/08/2019 CLINICAL DATA:  Fall and pain EXAM: LEFT ANKLE COMPLETE - 3+ VIEW COMPARISON:  None. FINDINGS: There is diffuse osteopenia. Mildly impacted fracture of the distal tibial metadiaphysis with intra-articular extension seen anteriorly on the lateral view. There is also a slightly impacted nondisplaced fracture of the distal fibula at the level of the ankle mortise. Osteoarthritis of the midfoot is seen. There is diffuse overlying soft tissue swelling. IMPRESSION: Impacted distal tibial fracture with intra-articular extension seen anteriorly. Nondisplaced impacted distal fibular fracture. Overlying soft tissue swelling. Electronically Signed   By: Prudencio Pair M.D.   On: 08/08/2019 04:25   DG Femur Portable Min 2 Views Left  Result Date: 08/08/2019 CLINICAL DATA:  Pain after fall EXAM: LEFT FEMUR PORTABLE 2 VIEWS COMPARISON:  None. FINDINGS: The patient is status post ORIF with healed fracture deformity of the proximal femur. There is diffuse osteopenia. No periprosthetic lucency or acute fracture seen. There is dense vascular calcifications. Mild overlying soft tissue swelling seen at the left hip. IMPRESSION: Status post ORIF of the left proximal femur. No acute osseous abnormality. Electronically Signed   By: Prudencio Pair M.D.   On: 08/08/2019 05:56    Pending Labs Unresulted Labs (From admission, onward)    Start     Ordered   08/09/19 [id number]  Basic metabolic panel  Tomorrow morning,   R     08/08/19 0917   08/09/19 0500  CBC  Tomorrow morning,   R     08/08/19 0917   08/08/19 0928  TSH  Add-on,   AD     08/08/19 [id number]   08/08/19 0846  Respiratory Panel by RT PCR (Flu A&B, Covid) - Nasopharyngeal Swab  (Tier 2 Respiratory Panel by RT PCR (Flu A&B, Covid) (TAT 2 hrs))  Once,   STAT    Question Answer Comment  Is this test for diagnosis or screening Screening  Symptomatic for COVID-19 as defined by CDC No   Hospitalized for COVID-19 No   Admitted  to ICU for COVID-19 No   Previously tested for COVID-19 Yes   Resident in a congregate (group) care setting No   Employed in healthcare setting No   Pregnant No      08/08/19 0845          Vitals/Pain Today's Vitals   08/08/19 0845 08/08/19 0900 08/08/19 1000 08/08/19 1017  BP: (!) 146/115 (!) 166/126 (!) 172/136 (!) 138/101  Pulse: (!) 54 (!) 102 (!) 116 84  Resp: (!) 21 (!) 28 (!) 22 20  Temp:      TempSrc:      SpO2: 95% 94% 92% 92%    Isolation Precautions No active isolations  Medications Medications  HYDROcodone-acetaminophen (NORCO/VICODIN) 5-325 MG per tablet 1-2 tablet (1 tablet Oral Given 08/08/19 1020)  enoxaparin (LOVENOX) injection 40 mg (has no administration in time range)  0.9 %  sodium chloride infusion (has no administration in time range)  morphine 2 MG/ML injection 1 mg (1 mg Intravenous Given 08/08/19 0930)  sodium chloride flush (NS) 0.9 % injection 3 mL (3 mLs Intravenous Given 08/08/19 0553)  fentaNYL (SUBLIMAZE) injection 50 mcg (50 mcg Intravenous Given 08/08/19 0544)  0.9 %  sodium chloride infusion ( Intravenous New Bag/Given 08/08/19 1016)  ondansetron (ZOFRAN) injection 4 mg (4 mg Intravenous Given 08/08/19 0920)    Mobility walks with device High fall risk    R Recommendations: See Admitting Provider Note  Report given to:   Additional Notes:

## 2019-08-08 NOTE — Progress Notes (Signed)
Patient to xray via bed for post cast placement/ left ankle

## 2019-08-08 NOTE — ED Notes (Signed)
Patient now c/o central chest pain. Pt pulled back to triage to obtain EKG/labs until room is available for provider to assess patient. Pt unable to provide any details regarding her chest pain.

## 2019-08-08 NOTE — Progress Notes (Signed)
Orthopedic Tech Progress Note Patient Details:  Bailey [name] 1927/02/07 [id number]  Casting Type of Cast: Short leg cast Cast Location: LLE Cast Material: Fiberglass Cast Intervention: Application  Post Interventions Patient Tolerated: Fair Instructions Provided: Care of device     Braulio Bosch 08/08/2019, 2:11 PM

## 2019-08-08 NOTE — Progress Notes (Signed)
Ainsley Spinner PA is at the bedside for cast placement .Family and patient are aware of surgery cancelled

## 2019-08-08 NOTE — ED Provider Notes (Signed)
Pt signed out by Dr. Christy Gentles pending ortho recommendations.  Ortho would like to operate today.  Pt d/w Silvestre Gunner who requested medicine admit.  Pt d/w Dr. Harvest Forest (triad) for admission.  Additional pain meds and IVFs ordered.   Isla Pence, MD 08/08/19 [phone]

## 2019-08-08 NOTE — ED Provider Notes (Signed)
[facility] EMERGENCY DEPARTMENT Provider Note   CSN: [id number] Arrival date & time: 08/08/19  0348     History Chief Complaint  Patient presents with  . Ankle Pain    Bailey [name] is a 86 y.o. female.  The history is provided by the patient.  Ankle Pain Location:  Ankle Ankle location:  L ankle Pain details:    Quality:  Aching   Severity:  Moderate   Onset quality:  Sudden   Timing:  Constant   Progression:  Worsening Chronicity:  New Relieved by:  Nothing Exacerbated by: Movement. Associated symptoms: no fever and no neck pain   Patient with history of CKD, previous PE, anemia presents after fall.  Patient lives in assisted living.  Patient reports she fell while she was in the restroom.  She denies hitting her head.  No LOC.  She reports pain and swelling of the left ankle. She denies any other injuries.     Past Medical History:  Diagnosis Date  . Acquired cyst of kidney    left  . Allergic rhinitis, cause unspecified   . Anemia, unspecified   . Chronic hyponatremia 09/05/2015  . CKD stage 2 due to type 2 diabetes mellitus ([place]) 11/18/2014  . Closed fracture of unspecified part of vertebral column without mention of spinal cord injury    T7 s/p kyphoplasty, T12  . Edema 2015  . Herpes simplex without mention of complication   . History of Epstein-Barr virus infection   . Hyposmolality and/or hyponatremia    07/13/15 Na 131  . Insomnia   . Insomnia, unspecified   . Left cavernous carotid aneurysm 08/2008   1-1/41mm aneurysm of cavernous carotid artery  . Malignant neoplasm of breast (female), unspecified site 1990   left. Had surgerey and chemo, but no radiation  . 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 Neuropathy   . Osteoarthrosis of knee    bilaterally  . Osteoarthrosis, hip   . Osteoarthrosis, unspecified whether generalized or localized, forearm    bilaterally wrist  . Other and  unspecified nonspecific immunological findings    positive ANA  . Other B-complex deficiencies   . Pain in joint, site unspecified    severe, diffuse, chronic pain  . Positive ANA (antinuclear antibody)   . Pulmonary embolism ([place]) 05/07/2015  . Pure hypercholesterolemia   . Senile osteoporosis   . Unspecified essential hypertension   . Unspecified gastritis and gastroduodenitis without mention of hemorrhage   . Unspecified hypothyroidism   . Unspecified vitamin D deficiency     Patient Active Problem List   Diagnosis Date Noted  . Advanced care planning/counseling discussion 07/26/2019  . Pneumonia 03/21/2019  . Bilateral leg edema 09/29/2018  . Recurrent major depression ([place]) 01/15/2018  . Non-seasonal allergic rhinitis 06/19/2017  . Endometrial mass 06/19/2017  . Pleuritic pain 03/30/2017  . Essential hypertension 03/30/2017  . CKD (chronic kidney disease) stage 3, GFR 30-59 ml/min 03/30/2017  . Chronic Hyponatremia 03/30/2017  . History of pulmonary embolism 02/15/2017  . Osteoporosis without current pathological fracture 02/15/2017  . B12 deficiency 02/15/2017  . Generalized arthritis 02/15/2017  . Anemia of chronic disease 09/05/2015  . Chronic pain syndrome 04/16/2015  . Fracture of multiple pubic rami ([place]) 03/30/2015  . GERD without esophagitis 03/24/2015  . Chronic constipation 03/24/2015  . Fracture, intertrochanteric, right femur ([place])   . Osteoarthrosis, forearm   . Senile osteoporosis   .  Malignant neoplasm of female breast ([place])   . Insomnia   . Hypothyroidism   . Pure hypercholesterolemia   . Closed fracture of unspecified part of vertebral column without mention of spinal cord injury   . Edema     Past Surgical History:  Procedure Laterality Date  . BREAST SURGERY Left   . DILATATION & CURETTAGE/HYSTEROSCOPY WITH MYOSURE N/A 06/27/2017   Procedure: DILATATION & CURETTAGE/HYSTEROSCOPY WITH MYOSURE;  Surgeon: Servando Salina, MD;  Location: [facility];  Service: Gynecology;  Laterality: N/A;  . DILATION AND CURETTAGE OF UTERUS  X 2  . FEMUR IM NAIL Right 03/18/2015   Procedure: INTRAMEDULLARY (IM) NAIL FEMORAL;  Surgeon: Rod Can, MD;  Location: [facility];  Service: Orthopedics;  Laterality: Right;  . FRACTURE SURGERY     hip  . KYPHOPLASTY  07/03/2010   T12  . KYPHOPLASTY     C7  . LAPAROSCOPIC CHOLECYSTECTOMY  09/20/2006  . MASTECTOMY Left 04/29/1989  . MIDDLE EAR SURGERY Bilateral 1938  . NASAL SINUS SURGERY  1990 & 2000  . ORIF HIP FRACTURE Left 06/13/2007   following a syncopal episode  . TONSILLECTOMY  1968     OB History   No obstetric history on file.     No family history on file.  Social History   Tobacco Use  . Smoking status: Never Smoker  . Smokeless tobacco: Never Used  Substance Use Topics  . Alcohol use: Yes    Comment: 05/08/2015 "I'll have a glass of white wine q now and then"  . Drug use: No    Home Medications Prior to Admission medications   Medication Sig Start Date End Date Taking? Authorizing Provider  acetaminophen (TYLENOL) 500 MG tablet Take 1,000 mg by mouth daily.     [provider]  acetaminophen (TYLENOL) 500 MG tablet Take 500 mg by mouth every morning.     [provider]  amLODipine (NORVASC) 5 MG tablet Take 5 mg by mouth daily.    [provider]  famotidine (PEPCID) 10 MG tablet Take 10 mg by mouth at bedtime.    [provider]  feeding supplement (BOOST / RESOURCE BREEZE) LIQD Take 1 Container by mouth 2 (two) times daily between meals. (1000 & 1600) Prefers Chocolate    [provider]  fentaNYL (DURAGESIC) 25 MCG/HR Place 1 patch onto the skin every 3 (three) days.    [provider]  hydrALAZINE (APRESOLINE) 100 MG tablet Take 100 mg by mouth daily. If SBP >160 recheck B/P and document in vital sign record.     [provider]  hydrALAZINE (APRESOLINE) 50 MG tablet Take 50 mg by mouth 2 (two) times daily.     [provider]  ipratropium (ATROVENT) 0.06 % nasal spray Place 2 sprays into both nostrils 2 (two) times daily as needed for rhinitis. PRN-NASAL DRAINAGE    [provider]  irbesartan (AVAPRO) 300 MG tablet Take 300 mg by mouth daily. HOLD for B/P [id number], If systolic B/P is over 0000000, recheck B/P, document in Vital Signs.    [provider]  levothyroxine (SYNTHROID, LEVOTHROID) 150 MCG tablet Take 150 mcg by mouth daily before breakfast. (0730)    [provider]  Lidocaine 4 % PTCH Apply topically. Apply one patch topically to left hip every morning and remove every evening; resident may self apply; keep on medcart Twice A Day    [provider]  Lidocaine 4 % PTCH Apply topically. Apply one patch  topically to right shoulder every morning and remove every evening; resident may self apply; keep on med cart Twice A Day    [provider]  Lidocaine 4 % PTCH Apply topically. Apply one patch topically to left knee every morning and remove every evening; resident may self apply; keep on med cart Twice A Day    [provider]  loratadine (CLARITIN) 10 MG tablet Take 1 tablet (10 mg total) by mouth daily. 06/19/17   Blanchie Serve, MD  Menthol, Topical Analgesic, (BIOFREEZE) 4 % GEL Apply 1 application topically 3 (three) times daily as needed. Knee and hand pain    [provider]  Menthol, Topical Analgesic, (BIOFREEZE) 4 % GEL Apply to bilateral knees for pain may self administer may keep at bedside    [provider]  mirtazapine (REMERON) 30 MG tablet Take 30 mg by mouth at bedtime.    [provider]  ondansetron (ZOFRAN) 4 MG tablet Take 4 mg by mouth every 8 (eight) hours as needed for nausea or vomiting.    [provider]  Oxycodone HCl 10 MG TABS Take 10 mg by mouth every 8 (eight) hours as needed.    [provider]  oxyCODONE-acetaminophen (PERCOCET) 10-325 MG tablet Take 1 tablet by  mouth 3 (three) times daily.    [provider]  pantoprazole (PROTONIX) 40 MG tablet Take 40 mg by mouth daily.    [provider]  polyethylene glycol (MIRALAX / GLYCOLAX) packet Take 17 g by mouth daily.    [provider]  promethazine (PHENERGAN) 25 MG suppository Place 25 mg rectally every 6 (six) hours as needed for nausea or vomiting. nausea WITH vomiting x 24 hours. Clear liquid diet x24 hours. If NOT effective, notify MD.    [provider]  psyllium (METAMUCIL) 58.6 % powder Take 1 packet by mouth daily. Mix in 6 oz of water or juice    [provider]  sennosides-docusate sodium (SENOKOT-S) 8.6-50 MG tablet Take 1 tablet by mouth daily. Hold for loose stool    [provider]  sodium chloride 1 g tablet Take 1 g by mouth 2 (two) times daily.     [provider]  Wound Dressings (GRX HYDROCOLLOID DRESSING EX) Apply topically. Change dressing to coccyx as needed    [provider]  zinc oxide 20 % ointment Apply 1 application topically 3 (three) times daily as needed for irritation. Apply to buttocks for redness/dark pink/excoriation    [provider]  Zoster Vaccine Adjuvanted [facility]) injection Inject 0.5 mLs into the muscle once. Shingrix 0.5 ml IM x 1 repeated in 6 months Once - One Time on 09/09/2019 09/09/19 09/09/19  [provider]    Allergies    Aspirin, Hctz [hydrochlorothiazide], and Penicillins  Review of Systems   Review of Systems  Constitutional: Negative for fever.  Musculoskeletal: Positive for arthralgias. Negative for neck pain.  All other systems reviewed and are negative.   Physical Exam Updated Vital Signs BP 117/67 (BP Location: Right Arm)   Pulse (!) 106   Temp 98.1 F (36.7 C) (Oral)   Resp 18   SpO2 94%   Physical Exam CONSTITUTIONAL: Elderly and frail HEAD: Normocephalic/atraumatic EYES: EOMI ENMT: Mucous membranes moist NECK: supple no meningeal  signs SPINE/BACK:entire spine nontender, kyphotic spine CV: S1/S2 noted, no murmurs/rubs/gallops noted LUNGS: Lungs are clear to auscultation bilaterally, no apparent distress ABDOMEN: soft, nontender NEURO: Pt is awake/alert/appropriate, moves all extremitiesx4.  No facial  droop.   EXTREMITIES: pulses normal/equal, full ROM, tenderness/swelling noted to left ankle.  Distal pulses intact.  No lacerations.  There is no knee tenderness.  She has tenderness in her left thigh. All other extremities/joints palpated/ranged and nontender SKIN: warm, color normal PSYCH: no abnormalities of mood noted, alert and oriented to situation  ED Results / Procedures / Treatments   Labs (all labs ordered are listed, but only abnormal results are displayed) Labs Reviewed  CBC - Abnormal; Notable for the following components:      Result Value   WBC 13.2 (*)    RBC 3.70 (*)    MCV 101.9 (*)    All other components within normal limits  BASIC METABOLIC PANEL  TROPONIN I (HIGH SENSITIVITY)    EKG EKG Interpretation  Date/Time:  Thursday August 08 2019 04:46:51 EDT Ventricular Rate:  116 PR Interval:    QRS Duration: 72 QT Interval:  342 QTC Calculation: 475 R Axis:   18 Text Interpretation: Interpretation limited secondary to artifact Anterolateral infarct , age undetermined Abnormal ECG Confirmed by Ripley Fraise [phone]) on 08/08/2019 5:18:34 AM   Radiology DG Chest 2 View  Result Date: 08/08/2019 CLINICAL DATA:  Chest pain after mechanical fall EXAM: CHEST - 2 VIEW COMPARISON:  March 30, 2017 FINDINGS: The heart size and mediastinal contours are within normal limits. Aortic knob calcifications. There is slight hyperinflation of the lung zones. There is cement fixation seen within the mid and lower thoracic spine. Chronic appearing compression deformities of the midthoracic spine are seen with greater than 50% loss in vertebral body height. IMPRESSION: No active cardiopulmonary disease.  Electronically Signed   By: Prudencio Pair M.D.   On: 08/08/2019 05:19   DG Ankle Complete Left  Result Date: 08/08/2019 CLINICAL DATA:  Fall and pain EXAM: LEFT ANKLE COMPLETE - 3+ VIEW COMPARISON:  None. FINDINGS: There is diffuse osteopenia. Mildly impacted fracture of the distal tibial metadiaphysis with intra-articular extension seen anteriorly on the lateral view. There is also a slightly impacted nondisplaced fracture of the distal fibula at the level of the ankle mortise. Osteoarthritis of the midfoot is seen. There is diffuse overlying soft tissue swelling. IMPRESSION: Impacted distal tibial fracture with intra-articular extension seen anteriorly. Nondisplaced impacted distal fibular fracture. Overlying soft tissue swelling. Electronically Signed   By: Prudencio Pair M.D.   On: 08/08/2019 04:25   DG Femur Portable Min 2 Views Left  Result Date: 08/08/2019 CLINICAL DATA:  Pain after fall EXAM: LEFT FEMUR PORTABLE 2 VIEWS COMPARISON:  None. FINDINGS: The patient is status post ORIF with healed fracture deformity of the proximal femur. There is diffuse osteopenia. No periprosthetic lucency or acute fracture seen. There is dense vascular calcifications. Mild overlying soft tissue swelling seen at the left hip. IMPRESSION: Status post ORIF of the left proximal femur. No acute osseous abnormality. Electronically Signed   By: Prudencio Pair M.D.   On: 08/08/2019 05:56    Procedures Procedures  SPLINT APPLICATION Date/Time: 99991111 AM Authorized by: Sharyon Cable Consent: Verbal consent obtained. Risks and benefits: risks, benefits and alternatives were discussed Consent given by: patient Splint applied by: orthopedic technician Location details: left leg Splint type: posterior/stirrup Supplies used: ortho glass Post-procedure: The splinted body part was neurovascularly unchanged following the procedure. Patient tolerance: Patient tolerated the procedure well with no immediate  complications.     Medications Ordered in ED Medications  sodium chloride flush (NS) 0.9 % injection 3 mL (3 mLs Intravenous Given 08/08/19 0553)  fentaNYL (SUBLIMAZE) injection 50 mcg (50 mcg Intravenous Given 08/08/19 0544)    ED Course  I have reviewed the triage vital signs and the nursing notes.  Pertinent labs & imaging results that were available during my care of the patient were reviewed by me and considered in my medical decision making (see chart for details).    MDM Rules/Calculators/A&P                      6:03 AM Patient presents from assisted living after mechanical fall.  She has pain and swelling in her left ankle, she has an acute fracture.  No other signs of acute orthopedic injury.  No signs of head injury.  She will likely require admission due to age, lack of mobility and she is in assisted living 6:43 AM Overall patient appears improved.  She is not reporting chest pain this time.  I spoke to her son via the phone.  He reports patient may able to be discharged back to the facility to a higher level of care. Plan at this point is to get a final plan from orthopedics, and then will check into escalating her level of care at the [facility] No other acute issues this time, labs are pending 7:07 AM Discussed with Dr. Doran Durand with orthopedics.  He will review imaging and also evaluate the patient in ER and give final plan Signed out to Dr. Gilford Raid with ortho recs pending  Final Clinical Impression(s) / ED Diagnoses Final diagnoses:  Other closed fracture of distal end of left tibia, initial encounter    Rx / DC Orders ED Discharge Orders    None       Ripley Fraise, MD 08/08/19 [phone]

## 2019-08-08 NOTE — Plan of Care (Signed)

## 2019-08-08 NOTE — ED Notes (Signed)
Hooked patient up to the monitor patient is resting with call bell in reach and family at bedside

## 2019-08-08 NOTE — TOC Initial Note (Signed)
Transition of Care [facility]) - Initial/Assessment Note    Patient Details  Name: Bailey [name] MRN: [id number] Date of Birth: 23-Jun-1926  Transition of [facility]) CM/SW Contact:    Sharin Mons, RN Phone Number: [phone] 08/08/2019, 7:05 PM  Clinical Narrative:  Presents with Left ankle fracture 2/2 fall. Hx of HTN, DM type II, CKD stage II, hypothyroidism, chronic hyponatremia, s/p mastectomy, PE. From [facility]. DME: walker , W/C.       Nini Roudebush [place])     [phone]       Per ortho plan: short leg cast, NWB x 6 weeks, cast x 4 then conversion to CAM   TOC team following for TOC needs ....  Expected discharge plan:ALF Barriers to Discharge: Continued Medical Work up   Patient Goals and CMS Choice        Expected Discharge Plan and Services Expected Discharge Plan: Skilled Nursing Facility([facility])                                              Prior Living Arrangements/Services                       Activities of Daily Living Home Assistive Devices/Equipment: Environmental consultant (specify type), Wheelchair ADL Screening (condition at time of admission) Patient's cognitive ability adequate to safely complete daily activities?: No Is the patient deaf or have difficulty hearing?: Yes Does the patient have difficulty seeing, even when wearing glasses/contacts?: No Does the patient have difficulty concentrating, remembering, or making decisions?: No Patient able to express need for assistance with ADLs?: Yes Does the patient have difficulty dressing or bathing?: No Independently performs ADLs?: Yes (appropriate for developmental age) Does the patient have difficulty walking or climbing stairs?: Yes Weakness of Legs: Both Weakness of Arms/Hands: None  Permission Sought/Granted                  Emotional Assessment              Admission diagnosis:  Closed left ankle fracture [S82.892A] Other closed fracture of distal  end of left tibia, initial encounter [S82.392A] Patient Active Problem List   Diagnosis Date Noted  . Closed left ankle fracture 08/08/2019  . Leukocytosis 08/08/2019  . DNR (do not resuscitate) 08/08/2019  . Advanced care planning/counseling discussion 07/26/2019  . Pneumonia 03/21/2019  . Bilateral leg edema 09/29/2018  . Recurrent major depression ([place]) 01/15/2018  . Non-seasonal allergic rhinitis 06/19/2017  . Endometrial mass 06/19/2017  . Pleuritic pain 03/30/2017  . Essential hypertension 03/30/2017  . CKD (chronic kidney disease) stage 3, GFR 30-59 ml/min 03/30/2017  . Chronic Hyponatremia 03/30/2017  . History of pulmonary embolus (PE) 03/30/2017  . History of pulmonary embolism 02/15/2017  . Osteoporosis without current pathological fracture 02/15/2017  . B12 deficiency 02/15/2017  . Generalized arthritis 02/15/2017  . Anemia of chronic disease 09/05/2015  . Chronic pain syndrome 04/16/2015  . Fracture of multiple pubic rami ([place]) 03/30/2015  . GERD without esophagitis 03/24/2015  . Chronic constipation 03/24/2015  . Fracture, intertrochanteric, right femur ([place])   . Osteoarthrosis, forearm   . Senile osteoporosis   . Malignant neoplasm of female breast ([place])   . Insomnia   . Hypothyroidism   . Pure hypercholesterolemia   . Closed fracture of unspecified part of vertebral column without mention of spinal  cord injury   . Edema    PCP:  Virgie Dad, MD Pharmacy:   [place], [place] - [place] [address] [place] [address] Phone: [phone] Fax: [phone]     Social Determinants of Health (SDOH) Interventions    Readmission Risk Interventions No flowsheet data found.

## 2019-08-08 NOTE — ED Notes (Signed)
Dr. Christy Gentles called and spoke with Patient son and update given.

## 2019-08-08 NOTE — Telephone Encounter (Signed)
Late entry from 08/08/19 call overnight, called by nurse at 2 am on Bailey [name]. She had fallen earlier in  Shift and now was complaining of ankle pain. Reports left ankle is now swollen and painful. Nurse gave her PRN oxycodone that she had on her medication list. Nurse reported patient did not want to be sent out for evaluation but requested xrays. Orders given for xray of ankle however if patient in uncontrollable pain and needing immediate attention and evaluation would recommend going to hospital for evaluation- she had originally declined but It appears she decided to be sent out.  Sent to providers at facility for [facility]

## 2019-08-08 NOTE — ED Triage Notes (Signed)
[facility] after a mechanical fall while in the restroom. Pt states she feels like her leg gave out and she fell onto the floor, she pulled the emergency cord and staff came and helped her off the floor. Denies LOC and recalls entire event. Pain and swelling to L ankle. Pt a/ox4 but is repetitive at times- which staff at facility stated was baseline.

## 2019-08-09 ENCOUNTER — Inpatient Hospital Stay (HOSPITAL_COMMUNITY): Payer: Medicare Other

## 2019-08-09 DIAGNOSIS — T148XXA Other injury of unspecified body region, initial encounter: Secondary | ICD-10-CM

## 2019-08-09 LAB — BASIC METABOLIC PANEL
Anion gap: 11 (ref 5–15)
BUN: 18 mg/dL (ref 8–23)
CO2: 21 mmol/L — ABNORMAL LOW (ref 22–32)
Calcium: 8.2 mg/dL — ABNORMAL LOW (ref 8.9–10.3)
Chloride: 101 mmol/L (ref 98–111)
Creatinine, Ser: 0.9 mg/dL (ref 0.44–1.00)
GFR calc Af Amer: >60 mL/min (ref >60)
GFR calc non Af Amer: 56 mL/min — ABNORMAL LOW (ref >60)
Glucose, Bld: 134 mg/dL — ABNORMAL HIGH (ref 70–99)
Potassium: 3.5 mmol/L (ref 3.5–5.1)
Sodium: 133 mmol/L — ABNORMAL LOW (ref 135–145)

## 2019-08-09 LAB — CBC
HCT: 32.3 % — ABNORMAL LOW (ref 36.0–46.0)
Hemoglobin: 10.9 g/dL — ABNORMAL LOW (ref 12.0–15.0)
MCH: 33.5 pg (ref 26.0–34.0)
MCHC: 33.7 g/dL (ref 30.0–36.0)
MCV: 99.4 fL (ref 80.0–100.0)
Platelets: UNDETERMINED 10*3/uL (ref 150–400)
RBC: 3.25 MIL/uL — ABNORMAL LOW (ref 3.87–5.11)
RDW: 12.4 % (ref 11.5–15.5)
WBC: 8.1 10*3/uL (ref 4.0–10.5)
nRBC: 0 % (ref 0.0–0.2)

## 2019-08-09 MED ORDER — ASCORBIC ACID 500 MG PO TABS: 500.0000 mg | ORAL_TABLET | Freq: Every day | ORAL | 0 refills | Status: AC

## 2019-08-09 MED ORDER — ASCORBIC ACID 500 MG PO TABS
500.0000 mg | ORAL_TABLET | Freq: Every day | ORAL | Status: DC
  Administered 2019-08-09 – 2019-08-10 (×2): 500 mg via ORAL
  Filled 2019-08-09 (×2): qty 1

## 2019-08-09 MED ORDER — KETOROLAC TROMETHAMINE 15 MG/ML IJ SOLN
15.0000 mg | Freq: Three times a day (TID) | INTRAMUSCULAR | Status: AC | PRN
  Administered 2019-08-12: 11:00:00 15 mg via INTRAVENOUS
  Filled 2019-08-09: qty 1

## 2019-08-09 MED ORDER — VITAMIN D3 25 MCG PO TABS: 2000.0000 [IU] | ORAL_TABLET | Freq: Two times a day (BID) | ORAL | 1 refills | Status: AC

## 2019-08-09 MED ORDER — ENOXAPARIN SODIUM 30 MG/0.3ML ~~LOC~~ SOLN
30.0000 mg | SUBCUTANEOUS | Status: DC
  Administered 2019-08-09 – 2019-08-11 (×3): 30 mg via SUBCUTANEOUS
  Filled 2019-08-09 (×3): qty 0.3

## 2019-08-09 MED ORDER — VITAMIN D 25 MCG (1000 UNIT) PO TABS
2000.0000 [IU] | ORAL_TABLET | Freq: Two times a day (BID) | ORAL | Status: DC
  Administered 2019-08-09 – 2019-08-10 (×3): 2000 [IU] via ORAL
  Filled 2019-08-09 (×3): qty 2

## 2019-08-09 MED ORDER — ACETAMINOPHEN 10 MG/ML IV SOLN
1000.0000 mg | Freq: Three times a day (TID) | INTRAVENOUS | Status: AC
  Administered 2019-08-09 (×2): 1000 mg via INTRAVENOUS
  Filled 2019-08-09 (×2): qty 100

## 2019-08-09 MED ORDER — ACETAMINOPHEN 325 MG PO TABS
650.0000 mg | ORAL_TABLET | Freq: Four times a day (QID) | ORAL | Status: DC
  Administered 2019-08-10 – 2019-08-14 (×10): 650 mg via ORAL
  Filled 2019-08-09 (×10): qty 2

## 2019-08-09 NOTE — Progress Notes (Signed)
Chest xray complete- consult in for swallow study-pt npo at this time

## 2019-08-09 NOTE — Progress Notes (Signed)
Orthopaedic Trauma Service Progress Note  Patient ID: Bailey [name] MRN: [id number] DOB/AGE: [birth year] 84 y.o.  Subjective:  No acute ortho issues  Resting comfortably  Tolerating cast   ROS As above  Objective:   VITALS:   Vitals:   08/09/19 0304 08/09/19 0700 08/09/19 0717 08/09/19 0717  BP: (!) 145/99   (!) 156/91  Pulse: 86 93 86 88  Resp: 17   18  Temp:    99.2 F (37.3 C)  TempSrc:    Oral  SpO2: 90%   (!) 89%  Weight:      Height:        Estimated body mass index is 14.88 kg/m as calculated from the following:   Height as of this encounter: 5\' 8"  (1.727 m).   Weight as of this encounter: 44.4 kg.   Intake/Output      03/25 0701 - 03/26 0700 03/26 0701 - 03/27 0700   Urine (mL/kg/hr) 200 (0.2)    Total Output 200    Net -200         Urine Occurrence 1 x      LABS  Results for orders placed or performed during the hospital encounter of 08/08/19 (from the past 24 hour(s))  Respiratory Panel by RT PCR (Flu A&B, Covid) - Nasopharyngeal Swab     Status: None   Collection Time: 08/08/19  9:46 AM   Specimen: Nasopharyngeal Swab  Result Value Ref Range   SARS Coronavirus 2 by RT PCR NEGATIVE NEGATIVE   Influenza A by PCR NEGATIVE NEGATIVE   Influenza B by PCR NEGATIVE NEGATIVE  Type and screen [place]     Status: None   Collection Time: 08/08/19  9:54 AM  Result Value Ref Range   ABO/RH(D) A NEG    Antibody Screen NEG    Sample Expiration      08/11/2019,2359 Performed at [facility], [place] [address], [place], [place] [address]   ABO/Rh     Status: None   Collection Time: 08/08/19  9:54 AM  Result Value Ref Range   ABO/RH(D)      A NEG Performed at [facility] [address] [place], [place] [id number]   Basic metabolic panel     Status: Abnormal   Collection Time: 08/09/19  4:32 AM  Result Value Ref Range   Sodium 133 (L) 135 - 145  mmol/L   Potassium 3.5 3.5 - 5.1 mmol/L   Chloride 101 98 - 111 mmol/L   CO2 21 (L) 22 - 32 mmol/L   Glucose, Bld 134 (H) 70 - 99 mg/dL   BUN 18 8 - 23 mg/dL   Creatinine, Ser 0.90 0.44 - 1.00 mg/dL   Calcium 8.2 (L) 8.9 - 10.3 mg/dL   GFR calc non Af Amer 56 (L) >60 mL/min   GFR calc Af Amer >60 >60 mL/min   Anion gap 11 5 - 15  CBC     Status: Abnormal   Collection Time: 08/09/19  4:32 AM  Result Value Ref Range   WBC 8.1 4.0 - 10.5 K/uL   RBC 3.25 (L) 3.87 - 5.11 MIL/uL   Hemoglobin 10.9 (L) 12.0 - 15.0 g/dL   HCT 32.3 (L) 36.0 - 46.0 %   MCV 99.4 80.0 - 100.0 fL  MCH 33.5 26.0 - 34.0 pg   MCHC 33.7 30.0 - 36.0 g/dL   RDW 12.4 11.5 - 15.5 %   Platelets PLATELET CLUMPS NOTED ON SMEAR, UNABLE TO ESTIMATE 150 - 400 K/uL   nRBC 0.0 0.0 - 0.2 %     PHYSICAL EXAM:   Gen: resting comfortably, NAD  Ext:       Left Lower Extremity   SLC fitting well   No areas of excess pressure noted    Good clearance at the distal and proximal extend of the cast  Ext warm   Moving toes without difficulty   Sensation grossly intact  No swelling distally   Assessment/Plan: 1 Day Post-Op   Principal Problem:   Closed left ankle fracture Active Problems:   Senile osteoporosis   Hypothyroidism   GERD without esophagitis   Chronic pain syndrome   Essential hypertension   CKD (chronic kidney disease) stage 3, GFR 30-59 ml/min   Chronic Hyponatremia   History of pulmonary embolus (PE)   Leukocytosis   DNR (do not resuscitate)   Anti-infectives (From admission, onward)   Start     Dose/Rate Route Frequency Ordered Stop   08/08/19 1215  clindamycin (CLEOCIN) IVPB 900 mg     900 mg 100 mL/hr over 30 Minutes Intravenous To Short Stay 08/08/19 1152 08/09/19 1215    .  POD/HD#: 1  86 y/o female, ALF resident, s/p fall with L distal tibia and fibula fracture   - L distal tibia and fibula fracture   CT has appearance of subacute injury with some interval healing   Think surgery  presents more risk than benefit   Continue with non-op treatment     NWB L leg x 6 weeks  Cast x 4 then CAM  Follow up with ortho in 2 weeks. Will change cast if needed   Ice and elevate for swelling and pain control     Pt on several narcotic meds chronically. These are ample to cover whatever pain she is having in her left leg.  Do not feel that any escalation of her pain meds is warranted or safe     Ambulatory function sound pretty limited by report    - Pain management:  Home regimen   - DVT/PE prophylaxis:  Does not require pharmacologic prophylaxis from ortho standpoint   Do not think this fracture and subsequent restriction severely alter her activity level   - Metabolic Bone Disease:  Vitamin d pending, history of vitamin d deficiency   Supplement vitamin d given chronic opioid use   Osteoporosis evident on plain xrays and is multifactorial   Repeat DEXA as outpt, last DEXA was 2018 but I am unable to see results on system    Hx of hip fracture and multiple vertebral compression fractures   - Activity:  NWB L leg   - FEN/GI prophylaxis/Foley/Lines:  Swallow eval pending   Currently NPO  -Ex-fix/Splint care:  Do not get cast wet  Keep cast clean and dry   Call office with questions: [phone]  - Impediments to fracture healing:  Osteoporosis  Chronic opioid use  Thyroid disease   - Dispo:  Non-op treatment L distal tibia and fibula fracture   Therapies   Follow up with ortho in 2 weeks       Jari Pigg, PA-C [phone] (C) 08/09/2019, 8:38 AM  [facility] [place] [address] [phone] Domingo Sep (F)

## 2019-08-09 NOTE — Progress Notes (Signed)
PROGRESS NOTE    Bailey [name]  [id number] DOB: 1927-01-26 DOA: 08/08/2019 PCP: Virgie Dad, MD    Brief Narrative:  86yo with hx HTN, DM, CKD stage 2, hypothyroid, hx PE presented with fall with severe L ankle pain. Pt found to have bimalleolar fracture of L ankle. Orthopedic Surgery consulted. Ortho recs for splinting. PT/OT consulted.  Assessment & Plan:   Principal Problem:   Closed left ankle fracture Active Problems:   Senile osteoporosis   Hypothyroidism   GERD without esophagitis   Chronic pain syndrome   Essential hypertension   CKD (chronic kidney disease) stage 3, GFR 30-59 ml/min   Chronic Hyponatremia   History of pulmonary embolus (PE)   Leukocytosis   DNR (do not resuscitate)  Left ankle fracture secondary to fall:  -S/p mechanical fall while using the restroom.   -Found to have a bimalleolar left ankle fracture.  -Orthopedic Surgery consulted, later recommending splinting and non-surgical management as risk may not outweigh benefit to surgery -PT/OT consulted, recommendation for SNF, SW following -continue analgesics as needed  Leukocytosis: Acute.   -WBC elevated at 13.2.  Suspect that this is secondary to neutrophil marginalization related to the fracture. -Normalized -Repeat cbc  Hyponatremia: Chronic.   -Sodium on presentation of 129 which appears to be near her baseline which ranges from 129-131.   -She takes sodium chloride tablets 1 g twice daily -Sodium levels improving  Chronic pain on chronic opioids:  -Patient with long history of chronic pain due to arthritis.   -Home medications include fentanyl patch 25 mcg, Percocet10-325mg  3 times daily, oxycodone 10 mg every 8 hours as needed for moderate pain and lidocaine patches. -Continue current pain regimen and stool softeners as needed  Essential hypertension:  -Blood pressures initially elevated up to 172/136 at time of presentation.  Home medications include amlodipine 5 mg daily,  hydralazine 50 mg twice daily, and irbesartan 300 mg daily. -Continue current home regimen. BP currently stable - Continue with p.o. hydralazine as needed elevated blood pressure  Chronic kidney disease stage IIIa: Patient creatinine on admission 1.09.  Baseline creatinine appears to be around 0.9-1. -renal function stable, labs reviewed  Diabetes mellitus type 2:  -On admission glucose elevated up to 180.  Patient appears to be diet controlled and is not on any medications for treatment.  No recent hemoglobin A1c on file -random glucose this AM stable at 134 -Continue to monitor and consider placing on sliding scale insulin if needed  Hypothyroidism: Last TSH noted to be 2.73 on 07/16/2018. -TSH stable at 0.53 -Continue levothyroxine  Osteoporosis: Patient with previous fracture of the left femur over 10 years ago.  GERD  -Home medications include Protonix 40 mg daily and Pepcid 10 mg nightly -Continue home regimen  History of breast cancer: Patient status post left mastectomy.  History of PE: Patient not on anticoagulation.  DNR: Present on admission.  Patient also has MOST form.  Dysphagia -Discussed with SLP. Pt now s/p MBS, found to have very suboptimal findings, concerns of aspiration -SLP recs for trial of dysphagia diet. If unable to tolerate, likely need to discuss hospice or palliation at that time  DVT prophylaxis: Lovenox subq Code Status: DNR Family Communication: pt in room, family not at bedside Disposition Plan: From [facility], plan SNF when cleared by surgery and per SW. Also need to ensure pt can tolerate PO intake per SLP  Consultants:   Orthopedic Surgery  Procedures:     Antimicrobials: Anti-infectives (  From admission, onward)   Start     Dose/Rate Route Frequency Ordered Stop   08/08/19 1215  clindamycin (CLEOCIN) IVPB 900 mg     900 mg 100 mL/hr over 30 Minutes Intravenous To Short Stay 08/08/19 1152 08/09/19 1215        Subjective: Without complaints this AM. Hard of hearing  Objective: Vitals:   08/09/19 0717 08/09/19 0830 08/09/19 1316 08/09/19 1335  BP: (!) 156/91  (!) 162/121 (!) 137/94  Pulse: 88  81 74  Resp: 18  20 18   Temp: 99.2 F (37.3 C)  (!) 97.5 F (36.4 C)   TempSrc: Oral  Oral   SpO2: (!) 89% 91% 91% 92%  Weight:      Height:        Intake/Output Summary (Last 24 hours) at 08/09/2019 1524 Last data filed at 08/09/2019 0356 Gross per 24 hour  Intake --  Output 200 ml  Net -200 ml   Filed Weights   08/08/19 1425  Weight: 44.4 kg    Examination:  General exam: Appears calm and comfortable  Respiratory system: Clear to auscultation. Respiratory effort normal. Cardiovascular system: S1 & S2 heard, Regular Gastrointestinal system: Abdomen is nondistended, soft and nontender. No organomegaly or masses felt. Normal bowel sounds heard. Central nervous system: Alert and oriented. No focal neurological deficits. Extremities: Symmetric 5 x 5 power. Skin: No rashes, lesions Psychiatry: Judgement and insight appear normal. Mood & affect appropriate.   Data Reviewed: I have personally reviewed following labs and imaging studies  CBC: Recent Labs  Lab 08/08/19 0459 08/09/19 0432  WBC 13.2* 8.1  HGB 12.5 10.9*  HCT 37.7 32.3*  MCV 101.9* 99.4  PLT 179 PLATELET CLUMPS NOTED ON SMEAR, UNABLE TO ESTIMATE   Basic Metabolic Panel: Recent Labs  Lab 08/08/19 0459 08/09/19 0432  NA 129* 133*  K 4.0 3.5  CL 94* 101  CO2 22 21*  GLUCOSE 180* 134*  BUN 19 18  CREATININE 1.09* 0.90  CALCIUM 9.2 8.2*   GFR: Estimated Creatinine Clearance: 29.1 mL/min (by C-G formula based on SCr of 0.9 mg/dL). Liver Function Tests: No results for input(s): AST, ALT, ALKPHOS, BILITOT, PROT, ALBUMIN in the last 168 hours. No results for input(s): LIPASE, AMYLASE in the last 168 hours. No results for input(s): AMMONIA in the last 168 hours. Coagulation Profile: No results for input(s):  INR, PROTIME in the last 168 hours. Cardiac Enzymes: No results for input(s): CKTOTAL, CKMB, CKMBINDEX, TROPONINI in the last 168 hours. BNP (last 3 results) No results for input(s): PROBNP in the last 8760 hours. HbA1C: No results for input(s): HGBA1C in the last 72 hours. CBG: No results for input(s): GLUCAP in the last 168 hours. Lipid Profile: No results for input(s): CHOL, HDL, LDLCALC, TRIG, CHOLHDL, LDLDIRECT in the last 72 hours. Thyroid Function Tests: Recent Labs    08/08/19 0459  TSH 0.530   Anemia Panel: No results for input(s): VITAMINB12, FOLATE, FERRITIN, TIBC, IRON, RETICCTPCT in the last 72 hours. Sepsis Labs: No results for input(s): PROCALCITON, LATICACIDVEN in the last 168 hours.  Recent Results (from the past 240 hour(s))  Respiratory Panel by RT PCR (Flu A&B, Covid) - Nasopharyngeal Swab     Status: None   Collection Time: 08/08/19  9:46 AM   Specimen: Nasopharyngeal Swab  Result Value Ref Range Status   SARS Coronavirus 2 by RT PCR NEGATIVE NEGATIVE Final    Comment: (NOTE) SARS-CoV-2 target nucleic acids are NOT DETECTED. The SARS-CoV-2 RNA is generally  detectable in upper respiratoy specimens during the acute phase of infection. The lowest concentration of SARS-CoV-2 viral copies this assay can detect is 131 copies/mL. A negative result does not preclude SARS-Cov-2 infection and should not be used as the sole basis for treatment or other patient management decisions. A negative result may occur with  improper specimen collection/handling, submission of specimen other than nasopharyngeal swab, presence of viral mutation(s) within the areas targeted by this assay, and inadequate number of viral copies (<131 copies/mL). A negative result must be combined with clinical observations, patient history, and epidemiological information. The expected result is Negative. Fact Sheet for Patients:  PinkCheek.be Fact Sheet for  Healthcare Providers:  GravelBags.it This test is not yet ap proved or cleared by the [place] FDA and  has been authorized for detection and/or diagnosis of SARS-CoV-2 by FDA under an Emergency Use Authorization (EUA). This EUA will remain  in effect (meaning this test can be used) for the duration of the COVID-19 declaration under Section 564(b)(1) of the Act, 21 U.S.C. section 360bbb-3(b)(1), unless the authorization is terminated or revoked sooner.    Influenza A by PCR NEGATIVE NEGATIVE Final   Influenza B by PCR NEGATIVE NEGATIVE Final    Comment: (NOTE) The Xpert Xpress SARS-CoV-2/FLU/RSV assay is intended as an aid in  the diagnosis of influenza from Nasopharyngeal swab specimens and  should not be used as a sole basis for treatment. Nasal washings and  aspirates are unacceptable for Xpert Xpress SARS-CoV-2/FLU/RSV  testing. Fact Sheet for Patients: PinkCheek.be Fact Sheet for Healthcare Providers: GravelBags.it This test is not yet approved or cleared by the [place] FDA and  has been authorized for detection and/or diagnosis of SARS-CoV-2 by  FDA under an Emergency Use Authorization (EUA). This EUA will remain  in effect (meaning this test can be used) for the duration of the  Covid-19 declaration under Section 564(b)(1) of the Act, 21  U.S.C. section 360bbb-3(b)(1), unless the authorization is  terminated or revoked. Performed at [facility], [place] [address] [place], [place] [address]      Radiology Studies: DG Chest 2 View  Result Date: 08/08/2019 CLINICAL DATA:  Chest pain after mechanical fall EXAM: CHEST - 2 VIEW COMPARISON:  March 30, 2017 FINDINGS: The heart size and mediastinal contours are within normal limits. Aortic knob calcifications. There is slight hyperinflation of the lung zones. There is cement fixation seen within the mid and lower thoracic spine.  Chronic appearing compression deformities of the midthoracic spine are seen with greater than 50% loss in vertebral body height. IMPRESSION: No active cardiopulmonary disease. Electronically Signed   By: Prudencio Pair M.D.   On: 08/08/2019 05:19   DG Ankle Complete Left  Result Date: 08/08/2019 CLINICAL DATA:  Left ankle fracture EXAM: LEFT ANKLE COMPLETE - 3+ VIEW COMPARISON:  08/08/2019 FINDINGS: Redemonstration of impaction fractures of the distal tibial metaphysis and distal fibula. No change in alignment is evident. Bones are diffusely demineralized. Overlying casting material obscures fine osseous detail. IMPRESSION: Interval casting of distal tibial and fibular fractures without change in alignment. Electronically Signed   By: Davina Poke D.O.   On: 08/08/2019 15:38   DG Ankle Complete Left  Result Date: 08/08/2019 CLINICAL DATA:  Fall and pain EXAM: LEFT ANKLE COMPLETE - 3+ VIEW COMPARISON:  None. FINDINGS: There is diffuse osteopenia. Mildly impacted fracture of the distal tibial metadiaphysis with intra-articular extension seen anteriorly on the lateral view. There is also a slightly impacted nondisplaced fracture  of the distal fibula at the level of the ankle mortise. Osteoarthritis of the midfoot is seen. There is diffuse overlying soft tissue swelling. IMPRESSION: Impacted distal tibial fracture with intra-articular extension seen anteriorly. Nondisplaced impacted distal fibular fracture. Overlying soft tissue swelling. Electronically Signed   By: Prudencio Pair M.D.   On: 08/08/2019 04:25   CT ANKLE LEFT WO CONTRAST  Result Date: 08/08/2019 CLINICAL DATA:  Left ankle fracture EXAM: CT OF THE LEFT ANKLE WITHOUT CONTRAST TECHNIQUE: Multidetector CT imaging of the left ankle was performed according to the standard protocol. Multiplanar CT image reconstructions were also generated. COMPARISON:  08/08/2019 radiographs FINDINGS: Bones/Joint/Cartilage Prominent bony demineralization. Transverse  fracture of the lateral malleolus with some cortical buckling and 5 mm anterior displacement of the distal fracture fragment with mild apex posterior angulation. Transverse fracture of the distal tibial metaphysis with mild comminution, impaction, and with extension into the articular surface primarily anteriorly and medially. Periosteal reaction posteriorly. No additional fractures the hindfoot or midfoot noted. Ligaments Suboptimally assessed by CT. Muscles and Tendons Atrophic musculature in the foot. Soft tissues Subcutaneous edema along the ankle especially medially and laterally. Low-grade edema tracks along the heel and into the dorsum of the foot. IMPRESSION: 1. Transverse fracture of the distal tibial metaphysis with mild comminution and impaction. Involvement of the tibial plafond and anteriorly and medially. 2. Transverse fracture of the lateral malleolus with some cortical buckling and 5 mm anterior displacement of the distal fracture fragment. 3. Prominent bony demineralization. 4. Subcutaneous edema along the ankle and dorsum of the foot. 5. Atrophic musculature in the foot. Electronically Signed   By: Van Clines M.D.   On: 08/08/2019 11:05   DG CHEST PORT 1 VIEW  Result Date: 08/09/2019 CLINICAL DATA:  Aspiration EXAM: PORTABLE CHEST 1 VIEW COMPARISON:  08/08/2019 FINDINGS: Cardiomegaly. No confluent opacities, effusions or edema. No acute bony abnormality. IMPRESSION: Cardiomegaly.  No active disease. Electronically Signed   By: Rolm Baptise M.D.   On: 08/09/2019 00:23   DG Femur Portable Min 2 Views Left  Result Date: 08/08/2019 CLINICAL DATA:  Pain after fall EXAM: LEFT FEMUR PORTABLE 2 VIEWS COMPARISON:  None. FINDINGS: The patient is status post ORIF with healed fracture deformity of the proximal femur. There is diffuse osteopenia. No periprosthetic lucency or acute fracture seen. There is dense vascular calcifications. Mild overlying soft tissue swelling seen at the left hip.  IMPRESSION: Status post ORIF of the left proximal femur. No acute osseous abnormality. Electronically Signed   By: Prudencio Pair M.D.   On: 08/08/2019 05:56    Scheduled Meds: . [START ON 08/10/2019] acetaminophen  650 mg Oral Q6H  . amLODipine  5 mg Oral Daily  . vitamin C  500 mg Oral Daily  . chlorhexidine  60 mL Topical Once  . cholecalciferol  2,000 Units Oral BID  . ciprofloxacin-dexamethasone  4 drop Left EAR BID  . enoxaparin (LOVENOX) injection  40 mg Subcutaneous Q24H  . famotidine  10 mg Oral QHS  . fentaNYL  1 patch Transdermal Q72H  . hydrALAZINE  50 mg Oral BID  . irbesartan  300 mg Oral Daily  . levothyroxine  125 mcg Oral Q0600  . lidocaine  1 patch Transdermal Daily  . lidocaine  1 patch Transdermal Daily  . loratadine  10 mg Oral Daily  . mirtazapine  15 mg Oral QHS  . oxyCODONE-acetaminophen  1 tablet Oral TID   And  . oxyCODONE  5 mg Oral TID  .  pantoprazole  40 mg Oral Daily  . polyethylene glycol  17 g Oral Daily  . povidone-iodine  2 application Topical Once  . psyllium  1 packet Oral Daily  . senna-docusate  1 tablet Oral Daily  . sodium chloride  1 g Oral BID   Continuous Infusions: . acetaminophen 1,000 mg (08/09/19 1403)     LOS: 1 day   Marylu Lund, MD [facility] Pager On Amion  If 7PM-7AM, please contact night-coverage 08/09/2019, 3:24 PM

## 2019-08-09 NOTE — Progress Notes (Signed)
Pt complaining of chest pain at time of VS. Paged Dr. Wyline Copas to Manuela Schwartz RN phone, awaiting call back.

## 2019-08-09 NOTE — Progress Notes (Signed)
Modified Barium Swallow Progress Note  Patient Details  Name: Bailey [name] MRN: [id number] Date of Birth: [birth year]  Today's Date: 08/09/2019  Modified Barium Swallow completed.  Full report located under Chart Review in the Imaging Section.  Brief recommendations include the following:  Clinical Impression  Pt presents with a primary esophageal, secondary oropharyngeal dysphagia with the primary impairment being significant barium stasis throughout the esophagus.  Pt presents with intermittent, sensed, trace aspiration of thin liquids during the swallow - she generally was able to cough and eject the aspirate from just below her vocal folds out of her larynx. (A chin tuck was not protective.) There is an oral component, with reduced oral propulsion into the pharynx and reduced base of tongue contact with the posterior pharyngeal wall.  The epiglottis does not fully invert over the larynx, and there is moderate residue that remains in the vallecular space throughout the study.  Repeated scans of the esophagus revealed ongoing stasis which did not clear.  D/W Dr. Wyline Copas and pt's son, Ozzie Hoyle, after the study.  Video was reviewed with [facility] in the pt's room.  Pt may benefit from softer foods; eating smaller, more frequent meals throughout the day.  She should take small sips of thin liquid.  Further modifications in liquids could be made, however, she still has to contend with the risk of backflow of contents of esophagus into airway.  Recommend continuing soft solids to allow more options, thin liquids; crush meds and give in puree (applesauce, pudding, etc.). Discussed recommendations with Ozzie Hoyle, who verbalized understanding. No further acute SLP f/u is recommended. Pt may benefit from SLP at SNF to establish precautions/safe eating practices.    Swallow Evaluation Recommendations       SLP Diet Recommendations: Dysphagia 3 (Mech soft) solids;Thin liquid   Liquid Administration via: Cup;Straw   Medication Administration: Crushed with puree   Supervision: Staff to assist with self feeding;Patient able to self feed   Compensations: Slow rate;Small sips/bites;Follow solids with liquid   Postural Changes: Remain semi-upright after after feeds/meals (Comment)   Oral Care Recommendations: Oral care BID      Antonieta Slaven L. Tivis Ringer, [place] Office number [phone] Pager [phone]   Juan Quam Laurice 08/09/2019,4:08 PM

## 2019-08-09 NOTE — Evaluation (Signed)
Clinical/Bedside Swallow Evaluation Patient Details  Name: Bailey [name] MRN: [id number] Date of Birth: 06-Sep-1926  Today's Date: 08/09/2019 Time: SLP Start Time (ACUTE ONLY): 51 SLP Stop Time (ACUTE ONLY): 0936 SLP Time Calculation (min) (ACUTE ONLY): 16 min  Past Medical History:  Past Medical History:  Diagnosis Date  . Acquired cyst of kidney    left  . Allergic rhinitis, cause unspecified   . Anemia, unspecified   . Chronic hyponatremia 09/05/2015  . CKD stage 2 due to type 2 diabetes mellitus ([place]) 11/18/2014  . Closed fracture of unspecified part of vertebral column without mention of spinal cord injury    T7 s/p kyphoplasty, T12  . Edema 2015  . Herpes simplex without mention of complication   . History of Epstein-Barr virus infection   . Hyposmolality and/or hyponatremia    07/13/15 Na 131  . Insomnia   . Insomnia, unspecified   . Left cavernous carotid aneurysm 08/2008   1-1/16mm aneurysm of cavernous carotid artery  . Malignant neoplasm of breast (female), unspecified site 1990   left. Had surgerey and chemo, but no radiation  . 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 Neuropathy   . Osteoarthrosis of knee    bilaterally  . Osteoarthrosis, hip   . Osteoarthrosis, unspecified whether generalized or localized, forearm    bilaterally wrist  . Other and unspecified nonspecific immunological findings    positive ANA  . Other B-complex deficiencies   . Pain in joint, site unspecified    severe, diffuse, chronic pain  . Positive ANA (antinuclear antibody)   . Pulmonary embolism ([place]) 05/07/2015  . Pure hypercholesterolemia   . Senile osteoporosis   . Unspecified essential hypertension   . Unspecified gastritis and gastroduodenitis without mention of hemorrhage   . Unspecified hypothyroidism   . Unspecified vitamin D deficiency    Past Surgical History:  Past Surgical History:  Procedure Laterality Date  . BREAST SURGERY  Left   . DILATATION & CURETTAGE/HYSTEROSCOPY WITH MYOSURE N/A 06/27/2017   Procedure: DILATATION & CURETTAGE/HYSTEROSCOPY WITH MYOSURE;  Surgeon: Servando Salina, MD;  Location: [facility];  Service: Gynecology;  Laterality: N/A;  . DILATION AND CURETTAGE OF UTERUS  X 2  . FEMUR IM NAIL Right 03/18/2015   Procedure: INTRAMEDULLARY (IM) NAIL FEMORAL;  Surgeon: Rod Can, MD;  Location: [facility];  Service: Orthopedics;  Laterality: Right;  . FRACTURE SURGERY     hip  . KYPHOPLASTY  07/03/2010   T12  . KYPHOPLASTY     C7  . LAPAROSCOPIC CHOLECYSTECTOMY  09/20/2006  . MASTECTOMY Left 04/29/1989  . MIDDLE EAR SURGERY Bilateral 1938  . NASAL SINUS SURGERY  1990 & 2000  . ORIF HIP FRACTURE Left 06/13/2007   following a syncopal episode  . TONSILLECTOMY  1968   HPI:  86 y.o. female, [place] primary language, speaks fluent [place], with medical history significant for essential hypertension, DM type II, CKD stage II, hypothyroidism, chronic hyponatremia, s/p mastectomy, history of PE, chronic pain due to arthritis admitted from [facility] after falling and sustaining left ankle fracture. Plan is for non-operative tx, short leg cast and NWB for six weeks.  Pt incidentally noted to have difficulty swallowing, with wet cough observed after PO consumption. Chart review reveals clinical swallow evaluation performed 09/07/15 with findings suggesting a suspected esophageal dysphagia, but no concerns for an oropharyngeal dysphagia.     Assessment / Plan / Recommendation Clinical Impression  Pt awakened  from sleep; c/o 5/10 pain in back (informed RN who was on her way with pain meds).  She participated in clinical swallow evaluation.  Upper dentures were in container at bedside; cleaned and placed.  Pt asking for lower dentures - they were not present in container.  She demonstrated normal oral mechanism exam; adequate labial seal and oral manipulation of purees and thin liquids.  Consumption of thin  liquids led to immediate coughing, wet-sounding voice and regurgitation.  Consumption of purees led to multiple sub-swallows per each 1/2 teaspoon bolus.  Recommend proceeding with MBS today to elucidate source of dysphagia.  D/W pt, RN.  Assessment this morning was limited in quantity of POs provided due to interference of pain.  MBS scheduled for 1:45 this afternoon.  SLP Visit Diagnosis: Dysphagia, unspecified (R13.10)    Aspiration Risk       Diet Recommendation   NPO except applesauce/purees from floor stock and meds whole with puree  Medication Administration: Whole meds with puree    Other  Recommendations Oral Care Recommendations: Oral care BID   Follow up Recommendations        Frequency and Duration            Prognosis        Swallow Study   General Date of Onset: 08/08/19 HPI: 86 y.o. female, [place] primary language, speaks fluent English, with medical history significant for essential hypertension, DM type II, CKD stage II, hypothyroidism, chronic hyponatremia, s/p mastectomy, history of PE, chronic pain due to arthritis admitted from [facility] after falling and sustaining left ankle fracture. Plan is for non-operative tx, short leg cast and NWB for six weeks.  Pt incidentally noted to have difficulty swallowing, with wet cough observed after PO consumption. Chart review reveals clinical swallow evaluation performed 09/07/15 with findings suggesting a suspected esophageal dysphagia, but no concerns for an oropharyngeal dysphagia.   Type of Study: Bedside Swallow Evaluation Previous Swallow Assessment: see HPI Diet Prior to this Study: Regular;Thin liquids Temperature Spikes Noted: No Respiratory Status: Room air History of Recent Intubation: No Behavior/Cognition: Alert Oral Cavity Assessment: Within Functional Limits Oral Care Completed by SLP: No Oral Cavity - Dentition: Edentulous;Dentures, top Vision: Functional for self-feeding Self-Feeding Abilities:  Able to feed self Patient Positioning: Upright in bed Baseline Vocal Quality: Normal Volitional Cough: Strong Volitional Swallow: Able to elicit    Oral/Motor/Sensory Function Overall Oral Motor/Sensory Function: Within functional limits   Ice Chips Ice chips: Within functional limits   Thin Liquid Thin Liquid: Impaired Presentation: Straw;Cup Pharyngeal  Phase Impairments: Wet Vocal Quality;Multiple swallows;Cough - Immediate    Nectar Thick Nectar Thick Liquid: Not tested   Honey Thick Honey Thick Liquid: Not tested   Puree Puree: Impaired Presentation: Spoon Pharyngeal Phase Impairments: Multiple swallows   Solid     Solid: Not tested      Juan Quam Laurice 08/09/2019,9:45 AM   Estill Bamberg L. Tivis Ringer, [place] Office number [phone] Pager [phone]

## 2019-08-09 NOTE — Plan of Care (Addendum)
Physical therapist reported patient c/o chest pain and cough. Message given to Manuela Schwartz, Therapist, sports.   Problem: Education: Goal: Knowledge of General Education information will improve Description: Including pain rating scale, medication(s)/side effects and non-pharmacologic comfort measures Outcome: Progressing   Problem: Health Behavior/Discharge Planning: Goal: Ability to manage health-related needs will improve Outcome: Progressing   Problem: Clinical Measurements: Goal: Will remain free from infection Outcome: Progressing Goal: Respiratory complications will improve Outcome: Progressing Goal: Cardiovascular complication will be avoided Outcome: Progressing   Problem: Activity: Goal: Risk for activity intolerance will decrease Outcome: Progressing   Problem: Elimination: Goal: Will not experience complications related to bowel motility Outcome: Progressing   Problem: Pain Managment: Goal: General experience of comfort will improve Outcome: Progressing   Problem: Safety: Goal: Ability to remain free from injury will improve Outcome: Progressing   Problem: Skin Integrity: Goal: Risk for impaired skin integrity will decrease Outcome: Progressing

## 2019-08-09 NOTE — Plan of Care (Signed)
  Problem: Nutrition: Goal: Adequate nutrition will be maintained Outcome: Progressing   Problem: Safety: Goal: Ability to remain free from injury will improve Outcome: Progressing   Problem: Skin Integrity: Goal: Risk for impaired skin integrity will decrease Outcome: Progressing   

## 2019-08-09 NOTE — Progress Notes (Signed)
Telemetry creating yellow mews score upon recheck HR returned to base- she experiences short bursts of fast irregular heart rate

## 2019-08-09 NOTE — Evaluation (Signed)
Physical Therapy Evaluation Patient Details Name: Bailey [name] MRN: [id number] DOB: Dec 29, 1926 Today's Date: 08/09/2019   History of Present Illness  86 y.o. female with medical history significant of essential hypertension, DM type II, CKD stage II, hypothyroidism, chronic hyponatremia, s/p mastectomy, history of PE not on anticoagulate, chronic pain due to arthritis, on chronic opioid pain medicine, and presents after having a fall. Pt found to have bimalleolar fx of L ankle. Ortho recommendations for non-operative management, NWB LLE for 6 weeks.  Clinical Impression  Pt present to PT with deficits in functional mobility, gait, strength, balance, endurance, power, and with significant pain in chest. Pt limited by pain in chest often associated with coughing, pt requesting cough medicine during session and RN made aware. Pt currently requires significant assistance to perform all functional mobility and defers attempts at OOB mobility due to pain. Pt will continue to benefit from PT POC to reduce falls risk and caregive burden. Pt is very HOH, son communicating through typing words onto phone for patient to read, does seem to understand majority of what this PT verbalizes.    Follow Up Recommendations SNF;Supervision/Assistance - 24 hour    Equipment Recommendations  (defer to post-acute setting)    Recommendations for Other Services       Precautions / Restrictions Precautions Precautions: Fall Restrictions Weight Bearing Restrictions: Yes LLE Weight Bearing: Non weight bearing      Mobility  Bed Mobility Overal bed mobility: Needs Assistance Bed Mobility: Sit to Supine;Rolling;Sidelying to Sit Rolling: Mod assist Sidelying to sit: Mod assist Supine to sit: Max assist        Transfers Overall transfer level: (pt declines 2/2 pain)                  Ambulation/Gait                Stairs            Wheelchair Mobility    Modified Rankin (Stroke  Patients Only)       Balance Overall balance assessment: Needs assistance Sitting-balance support: Bilateral upper extremity supported;Feet supported Sitting balance-Leahy Scale: Poor Sitting balance - Comments: minA at edge of bed Postural control: Posterior lean                                   Pertinent Vitals/Pain Pain Assessment: Faces Faces Pain Scale: Hurts whole lot Pain Location: chest with coughing Pain Descriptors / Indicators: Aching Pain Intervention(s): Monitored during session;Other (comment)(pt requesting cough medicine, RN notified)    Home Living Family/patient expects to be discharged to:: Assisted living               Home Equipment: Walker - 4 wheels      Prior Function Level of Independence: Independent         Comments: pt reports ambulating for short household distances with use of Rollator     Hand Dominance   Dominant Hand: Right    Extremity/Trunk Assessment   Upper Extremity Assessment Upper Extremity Assessment: Generalized weakness    Lower Extremity Assessment Lower Extremity Assessment: Generalized weakness    Cervical / Trunk Assessment Cervical / Trunk Assessment: Kyphotic  Communication   Communication: HOH(very HOH)  Cognition Arousal/Alertness: Awake/alert Behavior During Therapy: WFL for tasks assessed/performed Overall Cognitive Status: Impaired/Different from baseline Area of Impairment: Orientation;Memory;Problem solving;Safety/judgement  Orientation Level: Disoriented to;Place   Memory: Decreased recall of precautions;Decreased short-term memory   Safety/Judgement: Decreased awareness of deficits;Decreased awareness of safety   Problem Solving: Slow processing;Requires verbal cues;Requires tactile cues        General Comments General comments (skin integrity, edema, etc.): pt limited by significant reports of chest pain, often citing pain along with coughing. Pt  requesting cough medicine.    Exercises     Assessment/Plan    PT Assessment Patient needs continued PT services  PT Problem List Decreased strength;Decreased activity tolerance;Decreased balance;Decreased mobility;Decreased cognition;Decreased knowledge of use of DME;Decreased safety awareness;Decreased knowledge of precautions;Pain       PT Treatment Interventions DME instruction;Gait training;Functional mobility training;Therapeutic activities;Therapeutic exercise;Balance training;Neuromuscular re-education;Patient/family education;Wheelchair mobility training    PT Goals (Current goals can be found in the Care Plan section)  Acute Rehab PT Goals Patient Stated Goal: To improve mobility and reduce pain PT Goal Formulation: With patient/family Time For Goal Achievement: 08/23/19 Potential to Achieve Goals: Fair Additional Goals Additional Goal #1: Pt will mobilize in manual wheelchair for 50' with minA and use of BUEs, while maintaining NWB through LLE.    Frequency Min 3X/week   Barriers to discharge        Co-evaluation               AM-PAC PT "6 Clicks" Mobility  Outcome Measure Help needed turning from your back to your side while in a flat bed without using bedrails?: A Lot Help needed moving from lying on your back to sitting on the side of a flat bed without using bedrails?: A Lot Help needed moving to and from a bed to a chair (including a wheelchair)?: Total Help needed standing up from a chair using your arms (e.g., wheelchair or bedside chair)?: Total Help needed to walk in hospital room?: Total Help needed climbing 3-5 steps with a railing? : Total 6 Click Score: 8    End of Session   Activity Tolerance: Patient limited by pain Patient left: in bed;with call bell/phone within reach;with bed alarm set;with family/visitor present Nurse Communication: Mobility status PT Visit Diagnosis: Other abnormalities of gait and mobility (R26.89);Muscle weakness  (generalized) (M62.81);Pain Pain - Right/Left: Left Pain - part of body: Leg(also chest)    Time: [id number] PT Time Calculation (min) (ACUTE ONLY): 33 min   Charges:   PT Evaluation $PT Eval Moderate Complexity: 1 Mod          Zenaida Niece, PT, DPT Acute Rehabilitation Pager: [phone]   Zenaida Niece 08/09/2019, 2:06 PM

## 2019-08-09 NOTE — Discharge Instructions (Signed)
Orthopaedic Discharge instructions  Nonweightbearing left leg  Do not remove cast Keep cast clean and dry, do not get cast wet Check skin around cast (around the calf and foot) daily for skin issues. Call office with concerns [phone]) Ice and elevate leg for swelling and pain control Ok to move toes for swelling and pain control   Follow up with orthopaedics in 2 weeks

## 2019-08-10 LAB — COMPREHENSIVE METABOLIC PANEL
ALT: 15 U/L (ref 0–44)
AST: 22 U/L (ref 15–41)
Albumin: 2.9 g/dL — ABNORMAL LOW (ref 3.5–5.0)
Alkaline Phosphatase: 47 U/L (ref 38–126)
Anion gap: 13 (ref 5–15)
BUN: 26 mg/dL — ABNORMAL HIGH (ref 8–23)
CO2: 21 mmol/L — ABNORMAL LOW (ref 22–32)
Calcium: 8.5 mg/dL — ABNORMAL LOW (ref 8.9–10.3)
Chloride: 99 mmol/L (ref 98–111)
Creatinine, Ser: 0.9 mg/dL (ref 0.44–1.00)
GFR calc Af Amer: >60 mL/min (ref >60)
GFR calc non Af Amer: 56 mL/min — ABNORMAL LOW (ref >60)
Glucose, Bld: 117 mg/dL — ABNORMAL HIGH (ref 70–99)
Potassium: 3.4 mmol/L — ABNORMAL LOW (ref 3.5–5.1)
Sodium: 133 mmol/L — ABNORMAL LOW (ref 135–145)
Total Bilirubin: 2 mg/dL — ABNORMAL HIGH (ref 0.3–1.2)
Total Protein: 5.7 g/dL — ABNORMAL LOW (ref 6.5–8.1)

## 2019-08-10 LAB — CBC
HCT: 36.1 % (ref 36.0–46.0)
Hemoglobin: 11.9 g/dL — ABNORMAL LOW (ref 12.0–15.0)
MCH: 32.9 pg (ref 26.0–34.0)
MCHC: 33 g/dL (ref 30.0–36.0)
MCV: 99.7 fL (ref 80.0–100.0)
Platelets: 80 10*3/uL — ABNORMAL LOW (ref 150–400)
RBC: 3.62 MIL/uL — ABNORMAL LOW (ref 3.87–5.11)
RDW: 12.6 % (ref 11.5–15.5)
WBC: 8.4 10*3/uL (ref 4.0–10.5)
nRBC: 0 % (ref 0.0–0.2)

## 2019-08-10 LAB — VITAMIN D 25 HYDROXY (VIT D DEFICIENCY, FRACTURES): Vit D, 25-Hydroxy: 43.1 ng/mL (ref 30–100)

## 2019-08-10 MED ORDER — METOPROLOL TARTRATE 5 MG/5ML IV SOLN: 5.0000 mg | Freq: Four times a day (QID) | INTRAVENOUS | Status: DC

## 2019-08-10 MED ORDER — DEXTROSE-NACL 5-0.9 % IV SOLN: INTRAVENOUS | Status: DC

## 2019-08-10 MED ORDER — MORPHINE SULFATE (PF) 2 MG/ML IV SOLN
1.0000 mg | INTRAVENOUS | Status: DC | PRN
  Administered 2019-08-10 – 2019-08-11 (×3): 1 mg via INTRAVENOUS
  Filled 2019-08-10 (×3): qty 1

## 2019-08-10 MED ORDER — POTASSIUM CHLORIDE CRYS ER 20 MEQ PO TBCR
40.0000 meq | EXTENDED_RELEASE_TABLET | Freq: Once | ORAL | Status: AC
  Administered 2019-08-10: 40 meq via ORAL
  Filled 2019-08-10: qty 2

## 2019-08-10 MED ORDER — PANTOPRAZOLE SODIUM 40 MG IV SOLR
40.0000 mg | INTRAVENOUS | Status: DC
  Administered 2019-08-10 – 2019-08-13 (×4): 40 mg via INTRAVENOUS
  Filled 2019-08-10 (×4): qty 40

## 2019-08-10 MED ORDER — METOPROLOL TARTRATE 5 MG/5ML IV SOLN
2.5000 mg | Freq: Four times a day (QID) | INTRAVENOUS | Status: DC
  Administered 2019-08-11 – 2019-08-14 (×14): 2.5 mg via INTRAVENOUS
  Filled 2019-08-10 (×13): qty 5

## 2019-08-10 MED ORDER — METRONIDAZOLE IN NACL 5-0.79 MG/ML-% IV SOLN
500.0000 mg | Freq: Three times a day (TID) | INTRAVENOUS | Status: DC
  Administered 2019-08-10 – 2019-08-12 (×5): 500 mg via INTRAVENOUS
  Filled 2019-08-10 (×6): qty 100

## 2019-08-10 MED ORDER — LEVOTHYROXINE SODIUM 100 MCG/5ML IV SOLN
62.5000 ug | Freq: Every day | INTRAVENOUS | Status: DC
  Administered 2019-08-10 – 2019-08-12 (×3): 62.5 ug via INTRAVENOUS
  Filled 2019-08-10 (×3): qty 5

## 2019-08-10 MED ORDER — SODIUM CHLORIDE 0.9 % IV SOLN
1.0000 g | INTRAVENOUS | Status: DC
  Administered 2019-08-10 – 2019-08-11 (×2): 1 g via INTRAVENOUS
  Filled 2019-08-10 (×2): qty 10

## 2019-08-10 NOTE — Progress Notes (Signed)
PROGRESS NOTE    Bailey [name]  [id number] DOB: 1926/06/02 DOA: 08/08/2019 PCP: Virgie Dad, MD    Brief Narrative:  86yo with hx HTN, DM, CKD stage 2, hypothyroid, hx PE presented with fall with severe L ankle pain. Pt found to have bimalleolar fracture of L ankle. Orthopedic Surgery consulted. Ortho recs for splinting. PT/OT consulted.  Assessment & Plan:   Principal Problem:   Closed left ankle fracture Active Problems:   Senile osteoporosis   Hypothyroidism   GERD without esophagitis   Chronic pain syndrome   Essential hypertension   CKD (chronic kidney disease) stage 3, GFR 30-59 ml/min   Chronic Hyponatremia   History of pulmonary embolus (PE)   Leukocytosis   DNR (do not resuscitate)  Left ankle fracture secondary to fall:  -S/p mechanical fall while using the restroom.   -Found to have a bimalleolar left ankle fracture.  -Orthopedic Surgery consulted, later recommending splinting and non-surgical management as risk may not outweigh benefit to surgery -PT/OT consulted, recommendation for SNF, SW following. Awaiting bed placement per SW -continue analgesics as needed  Leukocytosis: Acute.   -WBC elevated at 13.2.  Suspect that this is secondary to neutrophil marginalization related to the fracture. -Normalized -Cont to follow CBC  Hyponatremia: .   -Sodium on presentation of 129 which appears to be near her baseline which ranges from 129-131.   -She takes sodium chloride tablets 1 g twice daily -Sodium levels have improved, stable thus far  Chronic pain on chronic opioids:  -Patient with long history of chronic pain due to arthritis.   -Home medications include fentanyl patch 25 mcg, Percocet10-325mg  3 times daily, oxycodone 10 mg every 8 hours as needed for moderate pain and lidocaine patches. -Continue current pain regimen and stool softeners as needed  Essential hypertension:  -Blood pressures initially elevated up to 172/136 at time of  presentation.  Home medications include amlodipine 5 mg daily, hydralazine 50 mg twice daily, and irbesartan 300 mg daily. -Continue current home regimen. BP stable at this time - Continue with p.o. hydralazine as needed elevated blood pressure  Chronic kidney disease stage IIIa: Patient creatinine on admission 1.09.  Baseline creatinine appears to be around 0.9-1. -renal function stable, labs reviewed  Diabetes mellitus type 2:  -On admission glucose elevated up to 180.  Patient appears to be diet controlled and is not on any medications for treatment.  No recent hemoglobin A1c on file -random glucose this AM stable at 99 -Continue to monitor and consider placing on sliding scale insulin if needed  Hypothyroidism: Last TSH noted to be 2.73 on 07/16/2018. -TSH stable at 0.53 -Continue levothyroxine as tolerated  Osteoporosis: Patient with previous fracture of the left femur over 10 years ago.  GERD  -Home medications include Protonix 40 mg daily and Pepcid 10 mg nightly -Continue home regimen  History of breast cancer: Patient status post left mastectomy.  History of PE: Patient not on anticoagulation.  DNR: Present on admission.  Patient also has MOST form.  Dysphagia -Discussed with SLP. Pt now s/p MBS, found to have very suboptimal findings, concerns of aspiration -SLP recs for trial of dysphagia diet. If unable to tolerate, likely need to discuss hospice or palliation at that time  Dehydration -See above, pt with dysphagia and limited PO intake -mucus membranes are dry and skin turgor is poor -Will start d5NS at 75cc/hr -Encourage PO intake  DVT prophylaxis: Lovenox subq Code Status: DNR Family Communication: pt in room, family not  at bedside Disposition Plan: [facility], plan SNF awaiting bed placement  Consultants:   Orthopedic Surgery  Procedures:     Antimicrobials: Anti-infectives (From admission, onward)   Start     Dose/Rate Route  Frequency Ordered Stop   08/08/19 1215  clindamycin (CLEOCIN) IVPB 900 mg     900 mg 100 mL/hr over 30 Minutes Intravenous To Short Stay 08/08/19 1152 08/09/19 1215      Subjective: Seems confused this AM  Objective: Vitals:   08/09/19 1335 08/09/19 2039 08/10/19 0400 08/10/19 1001  BP: (!) 137/94 (!) 109/53 (!) 105/59 (!) 105/59  Pulse: 74 80 88   Resp: 18 15 17    Temp:  97.9 F (36.6 C) 99.4 F (37.4 C)   TempSrc:  Oral Oral   SpO2: 92% 97% 97%   Weight:      Height:       No intake or output data in the 24 hours ending 08/10/19 1439 Filed Weights   08/08/19 1425  Weight: 44.4 kg    Examination: General exam: Awake, laying in bed, in nad, somewhat confused, dry mucus membranes Respiratory system: Normal respiratory effort, no wheezing Cardiovascular system: regular rate, s1, s2 Gastrointestinal system: Soft, nondistended, positive BS Central nervous system: CN2-12 grossly intact, strength intact Extremities: Perfused, no clubbing Skin: poor skin turgor, no notable skin lesions seen Psychiatry: Mood normal // no visual hallucinations   Data Reviewed: I have personally reviewed following labs and imaging studies  CBC: Recent Labs  Lab 08/08/19 0459 08/09/19 0432 08/10/19 0603  WBC 13.2* 8.1 8.4  HGB 12.5 10.9* 11.9*  HCT 37.7 32.3* 36.1  MCV 101.9* 99.4 99.7  PLT 179 PLATELET CLUMPS NOTED ON SMEAR, UNABLE TO ESTIMATE 80*   Basic Metabolic Panel: Recent Labs  Lab 08/08/19 0459 08/09/19 0432 08/10/19 0603  NA 129* 133* 133*  K 4.0 3.5 3.4*  CL 94* 101 99  CO2 22 21* 21*  GLUCOSE 180* 134* 117*  BUN 19 18 26*  CREATININE 1.09* 0.90 0.90  CALCIUM 9.2 8.2* 8.5*   GFR: Estimated Creatinine Clearance: 29.1 mL/min (by C-G formula based on SCr of 0.9 mg/dL). Liver Function Tests: Recent Labs  Lab 08/10/19 0603  AST 22  ALT 15  ALKPHOS 47  BILITOT 2.0*  PROT 5.7*  ALBUMIN 2.9*   No results for input(s): LIPASE, AMYLASE in the last 168 hours. No  results for input(s): AMMONIA in the last 168 hours. Coagulation Profile: No results for input(s): INR, PROTIME in the last 168 hours. Cardiac Enzymes: No results for input(s): CKTOTAL, CKMB, CKMBINDEX, TROPONINI in the last 168 hours. BNP (last 3 results) No results for input(s): PROBNP in the last 8760 hours. HbA1C: No results for input(s): HGBA1C in the last 72 hours. CBG: No results for input(s): GLUCAP in the last 168 hours. Lipid Profile: No results for input(s): CHOL, HDL, LDLCALC, TRIG, CHOLHDL, LDLDIRECT in the last 72 hours. Thyroid Function Tests: Recent Labs    08/08/19 0459  TSH 0.530   Anemia Panel: No results for input(s): VITAMINB12, FOLATE, FERRITIN, TIBC, IRON, RETICCTPCT in the last 72 hours. Sepsis Labs: No results for input(s): PROCALCITON, LATICACIDVEN in the last 168 hours.  Recent Results (from the past 240 hour(s))  Respiratory Panel by RT PCR (Flu A&B, Covid) - Nasopharyngeal Swab     Status: None   Collection Time: 08/08/19  9:46 AM   Specimen: Nasopharyngeal Swab  Result Value Ref Range Status   SARS Coronavirus 2 by RT  PCR NEGATIVE NEGATIVE Final    Comment: (NOTE) SARS-CoV-2 target nucleic acids are NOT DETECTED. The SARS-CoV-2 RNA is generally detectable in upper respiratoy specimens during the acute phase of infection. The lowest concentration of SARS-CoV-2 viral copies this assay can detect is 131 copies/mL. A negative result does not preclude SARS-Cov-2 infection and should not be used as the sole basis for treatment or other patient management decisions. A negative result may occur with  improper specimen collection/handling, submission of specimen other than nasopharyngeal swab, presence of viral mutation(s) within the areas targeted by this assay, and inadequate number of viral copies (<131 copies/mL). A negative result must be combined with clinical observations, patient history, and epidemiological information. The expected result is  Negative. Fact Sheet for Patients:  PinkCheek.be Fact Sheet for Healthcare Providers:  GravelBags.it This test is not yet ap proved or cleared by the [place] FDA and  has been authorized for detection and/or diagnosis of SARS-CoV-2 by FDA under an Emergency Use Authorization (EUA). This EUA will remain  in effect (meaning this test can be used) for the duration of the COVID-19 declaration under Section 564(b)(1) of the Act, 21 U.S.C. section 360bbb-3(b)(1), unless the authorization is terminated or revoked sooner.    Influenza A by PCR NEGATIVE NEGATIVE Final   Influenza B by PCR NEGATIVE NEGATIVE Final    Comment: (NOTE) The Xpert Xpress SARS-CoV-2/FLU/RSV assay is intended as an aid in  the diagnosis of influenza from Nasopharyngeal swab specimens and  should not be used as a sole basis for treatment. Nasal washings and  aspirates are unacceptable for Xpert Xpress SARS-CoV-2/FLU/RSV  testing. Fact Sheet for Patients: PinkCheek.be Fact Sheet for Healthcare Providers: GravelBags.it This test is not yet approved or cleared by the [place] FDA and  has been authorized for detection and/or diagnosis of SARS-CoV-2 by  FDA under an Emergency Use Authorization (EUA). This EUA will remain  in effect (meaning this test can be used) for the duration of the  Covid-19 declaration under Section 564(b)(1) of the Act, 21  U.S.C. section 360bbb-3(b)(1), unless the authorization is  terminated or revoked. Performed at [facility], [place] [address], [place], [place] [address]      Radiology Studies: DG Ankle Complete Left  Result Date: 08/08/2019 CLINICAL DATA:  Left ankle fracture EXAM: LEFT ANKLE COMPLETE - 3+ VIEW COMPARISON:  08/08/2019 FINDINGS: Redemonstration of impaction fractures of the distal tibial metaphysis and distal fibula. No change in alignment is  evident. Bones are diffusely demineralized. Overlying casting material obscures fine osseous detail. IMPRESSION: Interval casting of distal tibial and fibular fractures without change in alignment. Electronically Signed   By: Davina Poke D.O.   On: 08/08/2019 15:38   DG CHEST PORT 1 VIEW  Result Date: 08/09/2019 CLINICAL DATA:  Aspiration EXAM: PORTABLE CHEST 1 VIEW COMPARISON:  08/08/2019 FINDINGS: Cardiomegaly. No confluent opacities, effusions or edema. No acute bony abnormality. IMPRESSION: Cardiomegaly.  No active disease. Electronically Signed   By: Rolm Baptise M.D.   On: 08/09/2019 00:23   DG Swallowing Func-Speech Pathology  Result Date: 08/09/2019 Objective Swallowing Evaluation: Type of Study: MBS-Modified Barium Swallow Study  Patient Details Name: Bailey [name] MRN: [id number] Date of Birth: 1926-10-30 Today's Date: 08/09/2019 Time: SLP Start Time (ACUTE ONLY): 1445 -SLP Stop Time (ACUTE ONLY): [id number] SLP Time Calculation (min) (ACUTE ONLY): 45 min Past Medical History: Past Medical History: Diagnosis Date  Acquired cyst of kidney   left  Allergic rhinitis, cause unspecified  Anemia, unspecified   Chronic hyponatremia 09/05/2015  CKD stage 2 due to type 2 diabetes mellitus ([place]) 11/18/2014  Closed fracture of unspecified part of vertebral column without mention of spinal cord injury   T7 s/p kyphoplasty, T12  Edema 2015  Herpes simplex without mention of complication   History of Epstein-Barr virus infection   Hyposmolality and/or hyponatremia   07/13/15 Na 131  Insomnia   Insomnia, unspecified   Left cavernous carotid aneurysm 08/2008  1-1/4mm aneurysm of cavernous carotid artery  Malignant neoplasm of breast (female), unspecified site 1990  left. Had surgerey and chemo, but no radiation  Mononeuritis of lower limb, unspecified   sensory motor neuropathy of both legs  Neuropathy   Osteoarthrosis of knee   bilaterally  Osteoarthrosis, hip   Osteoarthrosis, unspecified whether  generalized or localized, forearm   bilaterally wrist  Other and unspecified nonspecific immunological findings   positive ANA  Other B-complex deficiencies   Pain in joint, site unspecified   severe, diffuse, chronic pain  Positive ANA (antinuclear antibody)   Pulmonary embolism ([place]) 05/07/2015  Pure hypercholesterolemia   Senile osteoporosis   Unspecified essential hypertension   Unspecified gastritis and gastroduodenitis without mention of hemorrhage   Unspecified hypothyroidism   Unspecified vitamin D deficiency  Past Surgical History: Past Surgical History: Procedure Laterality Date  BREAST SURGERY Left   DILATATION & CURETTAGE/HYSTEROSCOPY WITH MYOSURE N/A 06/27/2017  Procedure: DILATATION & CURETTAGE/HYSTEROSCOPY WITH MYOSURE;  Surgeon: Servando Salina, MD;  Location: [facility];  Service: Gynecology;  Laterality: N/A;  DILATION AND CURETTAGE OF UTERUS  X 2  FEMUR IM NAIL Right 03/18/2015  Procedure: INTRAMEDULLARY (IM) NAIL FEMORAL;  Surgeon: Rod Can, MD;  Location: [facility];  Service: Orthopedics;  Laterality: Right;  FRACTURE SURGERY    hip  KYPHOPLASTY  07/03/2010  T12  KYPHOPLASTY    C7  LAPAROSCOPIC CHOLECYSTECTOMY  09/20/2006  MASTECTOMY Left 04/29/1989  MIDDLE EAR SURGERY Bilateral 1938  NASAL SINUS SURGERY  1990 & 2000  ORIF HIP FRACTURE Left 06/13/2007  following a syncopal episode  TONSILLECTOMY  1968 HPI: 86 y.o. female, [place] primary language, speaks fluent English, with medical history significant for essential hypertension, DM type II, CKD stage II, hypothyroidism, chronic hyponatremia, s/p mastectomy, history of PE, chronic pain due to arthritis admitted [facility] after falling and sustaining left ankle fracture. Plan is for non-operative tx, short leg cast and NWB for six weeks.  Pt incidentally noted to have difficulty swallowing, with wet cough observed after PO consumption. Chart review reveals clinical swallow evaluation performed 09/07/15 with  findings suggesting a suspected esophageal dysphagia, but no concerns for an oropharyngeal dysphagia.   Subjective: alert, HOH Assessment / Plan / Recommendation CHL IP CLINICAL IMPRESSIONS 08/09/2019 Clinical Impression Pt presents with a primary esophageal, secondary oropharyngeal dysphagia with the primary impairment being significant barium stasis throughout the esophagus.  Pt presents with intermittent, sensed, trace aspiration of thin liquids during the swallow - she generally was able to cough and eject the aspirate from just below her vocal folds out of her larynx. (A chin tuck was not protective.) There is an oral component, with reduced oral propulsion into the pharynx and reduced base of tongue contact with the posterior pharyngeal wall.  The epiglottis does not fully invert over the larynx, and there is moderate residue that remains in the vallecular space throughout the study.  Repeated scans of the esophagus revealed ongoing stasis which did not clear.  D/W Dr. Wyline Copas and pt's son, Lenox,  after the study.  Video was reviewed with [facility] in the pt's room.  Pt may benefit from softer foods; eating smaller, more frequent meals throughout the day.  She should take small sips of thin liquid.  Further modifications in liquids could be made, however, she still has to contend with the risk of backflow of contents of esophagus into airway.  Recommend continuing soft solids to allow more options, thin liquids; crush meds and give in puree (applesauce, pudding, etc.) Discussed recommendations with Ozzie Hoyle, who verbalized understanding. No further acute SLP f/u is recommended. Pt may benefit from SLP at SNF to establish precautions/safe eating practices.   SLP Visit Diagnosis Dysphagia, pharyngoesophageal phase (R13.14) Attention and concentration deficit following -- Frontal lobe and executive function deficit following -- Impact on safety and function Moderate aspiration risk   CHL IP TREATMENT RECOMMENDATION  08/09/2019 Treatment Recommendations Defer treatment plan to f/u with SLP   Prognosis 08/09/2019 Prognosis for Safe Diet Advancement Guarded Barriers to Reach Goals -- Barriers/Prognosis Comment -- CHL IP DIET RECOMMENDATION 08/09/2019 SLP Diet Recommendations Dysphagia 3 (Mech soft) solids;Thin liquid Liquid Administration via Cup;Straw Medication Administration Crushed with puree Compensations Slow rate;Small sips/bites;Follow solids with liquid Postural Changes Remain semi-upright after after feeds/meals (Comment)   CHL IP OTHER RECOMMENDATIONS 08/09/2019 Recommended Consults -- Oral Care Recommendations Oral care BID Other Recommendations --   CHL IP FOLLOW UP RECOMMENDATIONS 08/09/2019 Follow up Recommendations Skilled Nursing facility   No flowsheet data found.     CHL IP ORAL PHASE 08/09/2019 Oral Phase Impaired Oral - Pudding Teaspoon -- Oral - Pudding Cup -- Oral - Honey Teaspoon -- Oral - Honey Cup -- Oral - Nectar Teaspoon -- Oral - Nectar Cup -- Oral - Nectar Straw -- Oral - Thin Teaspoon -- Oral - Thin Cup -- Oral - Thin Straw -- Oral - Puree Decreased bolus cohesion;Delayed oral transit Oral - Mech Soft Delayed oral transit;Decreased bolus cohesion Oral - Regular -- Oral - Multi-Consistency -- Oral - Pill -- Oral Phase - Comment --  CHL IP PHARYNGEAL PHASE 08/09/2019 Pharyngeal Phase Impaired Pharyngeal- Pudding Teaspoon -- Pharyngeal -- Pharyngeal- Pudding Cup -- Pharyngeal -- Pharyngeal- Honey Teaspoon -- Pharyngeal -- Pharyngeal- Honey Cup -- Pharyngeal -- Pharyngeal- Nectar Teaspoon Delayed swallow initiation-vallecula;Reduced epiglottic inversion;Reduced airway/laryngeal closure;Reduced tongue base retraction;Pharyngeal residue - valleculae;Pharyngeal residue - pyriform Pharyngeal -- Pharyngeal- Nectar Cup -- Pharyngeal -- Pharyngeal- Nectar Straw -- Pharyngeal -- Pharyngeal- Thin Teaspoon Delayed swallow initiation-vallecula;Reduced epiglottic inversion;Reduced airway/laryngeal closure;Reduced tongue  base retraction;Pharyngeal residue - valleculae;Penetration/Aspiration during swallow;Trace aspiration Pharyngeal Material enters airway, passes BELOW cords then ejected out Pharyngeal- Thin Cup -- Pharyngeal -- Pharyngeal- Thin Straw Delayed swallow initiation-vallecula;Reduced epiglottic inversion;Reduced airway/laryngeal closure;Reduced tongue base retraction;Pharyngeal residue - valleculae;Pharyngeal residue - pyriform;Trace aspiration;Penetration/Aspiration during swallow Pharyngeal Material enters airway, passes BELOW cords then ejected out;Material enters airway, passes BELOW cords and not ejected out despite cough attempt by patient Pharyngeal- Puree Delayed swallow initiation-vallecula;Reduced epiglottic inversion;Reduced airway/laryngeal closure;Reduced tongue base retraction;Pharyngeal residue - valleculae;Pharyngeal residue - pyriform Pharyngeal -- Pharyngeal- Mechanical Soft Delayed swallow initiation-vallecula;Reduced epiglottic inversion;Reduced airway/laryngeal closure;Reduced tongue base retraction;Pharyngeal residue - valleculae;Pharyngeal residue - pyriform Pharyngeal -- Pharyngeal- Regular -- Pharyngeal -- Pharyngeal- Multi-consistency -- Pharyngeal -- Pharyngeal- Pill -- Pharyngeal -- Pharyngeal Comment --  CHL IP CERVICAL ESOPHAGEAL PHASE 08/09/2019 Cervical Esophageal Phase Impaired Pudding Teaspoon -- Pudding Cup -- Honey Teaspoon -- Honey Cup -- Nectar Teaspoon -- Nectar Cup -- Nectar Straw -- Thin Teaspoon -- Thin Cup -- Thin Straw -- Puree -- Mechanical Soft --  Regular -- Multi-consistency -- Pill -- Cervical Esophageal Comment -- Juan Quam Laurice 08/09/2019, 4:11 PM               Scheduled Meds:  acetaminophen  650 mg Oral Q6H   amLODipine  5 mg Oral Daily   vitamin C  500 mg Oral Daily   chlorhexidine  60 mL Topical Once   cholecalciferol  2,000 Units Oral BID   ciprofloxacin-dexamethasone  4 drop Left EAR BID   enoxaparin (LOVENOX) injection  30 mg Subcutaneous  Q24H   famotidine  10 mg Oral QHS   fentaNYL  1 patch Transdermal Q72H   hydrALAZINE  50 mg Oral BID   irbesartan  300 mg Oral Daily   levothyroxine  125 mcg Oral Q0600   lidocaine  1 patch Transdermal Daily   lidocaine  1 patch Transdermal Daily   loratadine  10 mg Oral Daily   mirtazapine  15 mg Oral QHS   oxyCODONE-acetaminophen  1 tablet Oral TID   And   oxyCODONE  5 mg Oral TID   pantoprazole  40 mg Oral Daily   polyethylene glycol  17 g Oral Daily   potassium chloride  40 mEq Oral Once   povidone-iodine  2 application Topical Once   psyllium  1 packet Oral Daily   senna-docusate  1 tablet Oral Daily   sodium chloride  1 g Oral BID   Continuous Infusions:  dextrose 5 % and 0.9% NaCl 75 mL/hr at 08/10/19 1337     LOS: 2 days   Marylu Lund, MD [facility] Pager On Amion  If 7PM-7AM, please contact night-coverage 08/10/2019, 2:39 PM

## 2019-08-10 NOTE — Progress Notes (Signed)
Telemetry generated yellow mews score while pt care being performed
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
## 2019-08-10 NOTE — NC FL2 (Signed)
[place] LEVEL OF CARE SCREENING TOOL     IDENTIFICATION  Patient Name: Bailey [name] Birthdate: 1926-05-19 Sex: female Admission Date (Current Location): 08/08/2019  [facility] and [place] Number:  Herbalist and Address:  [facility] [place], [place] [address], [place], [place] [address]      Provider Number: [id number]  Attending Physician Name and Address:  Donne Hazel, MD  Relative Name and Phone Number:  Leesha Kyzer [phone]    Current Level of Care: Hospital Recommended Level of Care: [place] Prior Approval Number:    Date Approved/Denied:   PASRR Number: [id number]  Discharge Plan: SNF    Current Diagnoses: Patient Active Problem List   Diagnosis Date Noted  . Closed left ankle fracture 08/08/2019  . Leukocytosis 08/08/2019  . DNR (do not resuscitate) 08/08/2019  . Advanced care planning/counseling discussion 07/26/2019  . Pneumonia 03/21/2019  . Bilateral leg edema 09/29/2018  . Recurrent major depression ([place]) 01/15/2018  . Non-seasonal allergic rhinitis 06/19/2017  . Endometrial mass 06/19/2017  . Pleuritic pain 03/30/2017  . Essential hypertension 03/30/2017  . CKD (chronic kidney disease) stage 3, GFR 30-59 ml/min 03/30/2017  . Chronic Hyponatremia 03/30/2017  . History of pulmonary embolus (PE) 03/30/2017  . History of pulmonary embolism 02/15/2017  . Osteoporosis without current pathological fracture 02/15/2017  . B12 deficiency 02/15/2017  . Generalized arthritis 02/15/2017  . Anemia of chronic disease 09/05/2015  . Chronic pain syndrome 04/16/2015  . Fracture of multiple pubic rami ([place]) 03/30/2015  . GERD without esophagitis 03/24/2015  . Chronic constipation 03/24/2015  . Fracture, intertrochanteric, right femur ([place])   . Osteoarthrosis, forearm   . Senile osteoporosis   . Malignant neoplasm of female breast ([place])   . Insomnia   . Hypothyroidism   . Pure hypercholesterolemia    . Closed fracture of unspecified part of vertebral column without mention of spinal cord injury   . Edema     Orientation RESPIRATION BLADDER Height & Weight     Self, Time, Situation, Place  Normal Continent, External catheter Weight: 97 lb 14.2 oz (44.4 kg) Height:  5\' 8"  (172.7 cm)  BEHAVIORAL SYMPTOMS/MOOD NEUROLOGICAL BOWEL NUTRITION STATUS      Continent Diet(see discharge summary)  AMBULATORY STATUS COMMUNICATION OF NEEDS Skin   Extensive Assist Verbally Normal, Skin abrasions                       Personal Care Assistance Level of Assistance  Bathing, Feeding, Dressing Bathing Assistance: Maximum assistance Feeding assistance: Limited assistance Dressing Assistance: Maximum assistance     Functional Limitations Info  Hearing   Hearing Info: Impaired      SPECIAL CARE FACTORS FREQUENCY  PT (By licensed PT), OT (By licensed OT)     PT Frequency: 5 times a week OT Frequency: 5 times a week            Contractures      Additional Factors Info  Code Status Code Status Info: DNR             Current Medications (08/10/2019):  This is the current hospital active medication list Current Facility-Administered Medications  Medication Dose Route Frequency Provider Last Rate Last Admin  . 0.9 % irrigation (POUR BTL)    PRN Altamese Ohiopyle, MD   1,000 mL at 08/08/19 1153  . acetaminophen (TYLENOL) tablet 650 mg  650 mg Oral Q6H Ainsley Spinner, PA-C   650 mg  at 08/10/19 0546  . amLODipine (NORVASC) tablet 5 mg  5 mg Oral Daily Smith, Rondell A, MD   5 mg at 08/10/19 1001  . ascorbic acid (VITAMIN C) tablet 500 mg  500 mg Oral Daily Ainsley Spinner, PA-C   500 mg at 08/10/19 1002  . chlorhexidine (HIBICLENS) 4 % liquid 4 application  60 mL Topical Once Lisette Abu, PA-C      . cholecalciferol (VITAMIN D3) tablet 2,000 Units  2,000 Units Oral BID Ainsley Spinner, PA-C   2,000 Units at 08/10/19 1002  . ciprofloxacin-dexamethasone (CIPRODEX) 0.3-0.1 % OTIC (EAR)  suspension 4 drop  4 drop Left EAR BID Fuller Plan A, MD   4 drop at 08/10/19 1006  . enoxaparin (LOVENOX) injection 30 mg  30 mg Subcutaneous Q24H Donne Hazel, MD   30 mg at 08/09/19 2303  . famotidine (PEPCID) tablet 10 mg  10 mg Oral QHS Smith, Rondell A, MD   10 mg at 08/09/19 2259  . fentaNYL (DURAGESIC) 25 MCG/HR 1 patch  1 patch Transdermal Q72H Smith, Rondell A, MD   1 patch at 08/09/19 1340  . hydrALAZINE (APRESOLINE) tablet 25 mg  25 mg Oral Q6H PRN Smith, Rondell A, MD      . hydrALAZINE (APRESOLINE) tablet 50 mg  50 mg Oral BID Tamala Julian, Rondell A, MD   50 mg at 08/10/19 1002  . ipratropium (ATROVENT) 0.06 % nasal spray 2 spray  2 spray Each Nare BID PRN Fuller Plan A, MD      . irbesartan (AVAPRO) tablet 300 mg  300 mg Oral Daily Smith, Rondell A, MD   300 mg at 08/10/19 1003  . ketorolac (TORADOL) 15 MG/ML injection 15 mg  15 mg Intravenous Q8H PRN Donne Hazel, MD      . levothyroxine (SYNTHROID) tablet 125 mcg  125 mcg Oral Q0600 Norval Morton, MD   125 mcg at 08/10/19 0546  . lidocaine (LIDODERM) 5 % 1 patch  1 patch Transdermal Daily Fuller Plan A, MD   1 patch at 08/10/19 1005  . lidocaine (LIDODERM) 5 % 1 patch  1 patch Transdermal Daily Fuller Plan A, MD   1 patch at 08/10/19 1004  . loratadine (CLARITIN) tablet 10 mg  10 mg Oral Daily Tamala Julian, Rondell A, MD   10 mg at 08/10/19 1003  . mirtazapine (REMERON) tablet 15 mg  15 mg Oral QHS Smith, Rondell A, MD   15 mg at 08/09/19 2300  . oxyCODONE (Oxy IR/ROXICODONE) immediate release tablet 10 mg  10 mg Oral Q8H PRN Fuller Plan A, MD      . oxyCODONE-acetaminophen (PERCOCET/ROXICET) 5-325 MG per tablet 1 tablet  1 tablet Oral TID Fuller Plan A, MD   1 tablet at 08/10/19 1003   And  . oxyCODONE (Oxy IR/ROXICODONE) immediate release tablet 5 mg  5 mg Oral TID Fuller Plan A, MD   5 mg at 08/10/19 1001  . pantoprazole (PROTONIX) EC tablet 40 mg  40 mg Oral Daily Tamala Julian, Rondell A, MD   40 mg at 08/10/19 1003   . polyethylene glycol (MIRALAX / GLYCOLAX) packet 17 g  17 g Oral Daily Smith, Rondell A, MD   17 g at 08/10/19 1001  . povidone-iodine 10 % swab 2 application  2 application Topical Once Lisette Abu, PA-C      . psyllium (HYDROCIL/METAMUCIL) packet 1 packet  1 packet Oral Daily Norval Morton, MD   1 packet at 08/10/19 1001  .  senna-docusate (Senokot-S) tablet 1 tablet  1 tablet Oral Daily Fuller Plan A, MD   1 tablet at 08/10/19 1002  . sodium chloride tablet 1 g  1 g Oral BID Tamala Julian, Rondell A, MD   1 g at 08/10/19 1004  . zinc oxide 20 % ointment 1 application  1 application Topical TID PRN Norval Morton, MD         Discharge Medications: Please see discharge summary for a list of discharge medications.  Relevant Imaging Results:  Relevant Lab Results:   Additional Information SSN: [id number]  Atilano Median, LCSW

## 2019-08-11 LAB — BASIC METABOLIC PANEL
Anion gap: 12 (ref 5–15)
BUN: 32 mg/dL — ABNORMAL HIGH (ref 8–23)
CO2: 23 mmol/L (ref 22–32)
Calcium: 8.2 mg/dL — ABNORMAL LOW (ref 8.9–10.3)
Chloride: 101 mmol/L (ref 98–111)
Creatinine, Ser: 0.94 mg/dL (ref 0.44–1.00)
GFR calc Af Amer: >60 mL/min (ref >60)
GFR calc non Af Amer: 53 mL/min — ABNORMAL LOW (ref >60)
Glucose, Bld: 156 mg/dL — ABNORMAL HIGH (ref 70–99)
Potassium: 3.3 mmol/L — ABNORMAL LOW (ref 3.5–5.1)
Sodium: 136 mmol/L (ref 135–145)

## 2019-08-11 MED ORDER — POTASSIUM CHLORIDE 10 MEQ/100ML IV SOLN
10.0000 meq | INTRAVENOUS | Status: AC
  Administered 2019-08-11 (×2): 10 meq via INTRAVENOUS

## 2019-08-11 MED ORDER — HYDRALAZINE HCL 50 MG PO TABS
50.0000 mg | ORAL_TABLET | Freq: Three times a day (TID) | ORAL | Status: DC
  Administered 2019-08-11 – 2019-08-12 (×2): 50 mg via ORAL
  Filled 2019-08-11 (×2): qty 1

## 2019-08-11 MED ORDER — POTASSIUM CHLORIDE 10 MEQ/100ML IV SOLN
10.0000 meq | INTRAVENOUS | Status: AC
  Administered 2019-08-11 (×2): 10 meq via INTRAVENOUS
  Filled 2019-08-11 (×3): qty 100

## 2019-08-11 NOTE — TOC Progression Note (Signed)
Transition of Care [facility]) - Progression Note    Patient Details  Name: Bailey [name] MRN: [id number] Date of Birth: 1926/08/14  Transition of Care [facility]) CM/SW [place], [place] Phone Number: [phone] 08/11/2019, 3:38 PM  Clinical Narrative:     CSW checked the hub for the bed offers. CSW noted that preference facility had not put in an offer. CSW attempted to follow up with [facility] and had to leave a message with Joellen Jersey.  TOC team will continue to follow for discharge planning needs.   Expected Discharge Plan: Skilled Nursing Facility([facility]) Barriers to Discharge: Continued Medical Work up  Expected Discharge Plan and Services Expected Discharge Plan: Skilled Nursing Facility([facility])                                               Social Determinants of Health (SDOH) Interventions    Readmission Risk Interventions No flowsheet data found.

## 2019-08-11 NOTE — Progress Notes (Signed)
PROGRESS NOTE    Bailey [name]  [id number] DOB: [birth year] DOA: 08/08/2019 PCP: Virgie Dad, MD    Brief Narrative:  86yo with hx HTN, DM, CKD stage 2, hypothyroid, hx PE presented with fall with severe L ankle pain. Pt found to have bimalleolar fracture of L ankle. Orthopedic Surgery consulted. Ortho recs for splinting. PT/OT consulted.  Assessment & Plan:   Principal Problem:   Closed left ankle fracture Active Problems:   Senile osteoporosis   Hypothyroidism   GERD without esophagitis   Chronic pain syndrome   Essential hypertension   CKD (chronic kidney disease) stage 3, GFR 30-59 ml/min   Chronic Hyponatremia   History of pulmonary embolus (PE)   Leukocytosis   DNR (do not resuscitate)  Left ankle fracture secondary to fall:  -S/p mechanical fall while using the restroom.   -Found to have a bimalleolar left ankle fracture.  -Orthopedic Surgery consulted, later recommending splinting and non-surgical management as risk may not outweigh benefit to surgery -PT/OT consulted, recommendation for SNF, SW following. Awaiting bed placement per SW -pt continued on PRN analgesics  Leukocytosis: Acute.   -Presenting WBC elevated at 13.2.  Suspect that this is secondary to neutrophil marginalization related to the fracture. -Normalized -Would cont to follow CBC   Hyponatremia: .   -Sodium on presentation of 129 which appears to be near her baseline which ranges from 129-131.   -She takes sodium chloride tablets 1 g twice daily -Sodium levels have since normalized  Chronic pain on chronic opioids:  -Patient with long history of chronic pain due to arthritis.   -Home medications include fentanyl patch 25 mcg, Percocet10-325mg  3 times daily, oxycodone 10 mg every 8 hours as needed for moderate pain and lidocaine patches. -See below. Concerns for aspiration noted on 3/27. Have transitioned analgesic to fentanyl patch with IV morphine PRN  Essential hypertension:    -Blood pressures initially elevated up to 172/136 at time of presentation.  Home medications include amlodipine 5 mg daily, hydralazine 50 mg twice daily, and irbesartan 300 mg daily. -Given recent aspiration, have minimized oral BP meds as tolerated. Currently on hydralazine 50mg  po bid, avapro 300mg  po daily.  -Added metoprolol 2.5mg  iv q6hrs - Continue with p.o. hydralazine as needed elevated blood pressure  Chronic kidney disease stage IIIa: Patient creatinine on admission 1.09.  Baseline creatinine appears to be around 0.9-1. -renal function remains stable, labs reviewed  Diabetes mellitus type 2:  -On admission glucose elevated up to 180.  Patient appears to be diet controlled and is not on any medications for treatment.  No recent hemoglobin A1c on file -random glucose this AM stable at 156 -Continue to monitor and consider placing on sliding scale insulin if needed  Hypothyroidism: Last TSH noted to be 2.73 on 07/16/2018. -TSH stable at 0.53 -Continue IV levothyroxine as tolerated  Osteoporosis: Patient with previous fracture of the left femur over 10 years ago.  GERD  -Home medications include Protonix 40 mg daily and Pepcid 10 mg nightly -Continue home regimen  History of breast cancer: Patient status post left mastectomy.  History of PE: Patient not on anticoagulation.  DNR: Present on admission.  Patient also has MOST form.  Dysphagia with aspriration -Pt now s/p MBS, found to have very suboptimal findings, concerns of aspiration -SLP recs for trial of dysphagia diet. On 3/27, patient noted to have acutely worsened resp effort and O2 requirements made worse with PO intake, highly suggestive of aspiration event -Pt NPO overnight, reduced  PO meds as tolerated -Started empiric cefepime and flagyl for aspiration PNA -Updated son who agrees with formal Palliative Care consultation to establish goals of care -Clinically seems better today. Pt asking to have something  to eat. Will allow trial of dysphagia 1 diet with aspiration precautions   Dehydration -See above, pt with dysphagia and limited PO intake -mucus membranes remain somewhat dry -improved with IVF hydration  DVT prophylaxis: Lovenox subq Code Status: DNR Family Communication: pt in room, family not at bedside [facility]position Plan: [facility], plan SNF awaiting bed placement, also awaiting Palliative Care for goals of care  Consultants:   Orthopedic Surgery  Palliative Care  Procedures:     Antimicrobials: Anti-infectives (From admission, onward)   Start     Dose/Rate Route Frequency Ordered Stop   08/10/19 1930  cefTRIAXone (ROCEPHIN) 1 g in sodium chloride 0.9 % 100 mL IVPB     1 g 200 mL/hr over 30 Minutes Intravenous Every 24 hours 08/10/19 1858     08/10/19 1930  metroNIDAZOLE (FLAGYL) IVPB 500 mg     500 mg 100 mL/hr over 60 Minutes Intravenous Every 8 hours 08/10/19 1858     08/08/19 1215  clindamycin (CLEOCIN) IVPB 900 mg     900 mg 100 mL/hr over 30 Minutes Intravenous To Short Stay 08/08/19 1152 08/09/19 1215      Subjective: Asking for something to eat and drink. More alert. Pt reports feeling somewhat better  Objective: Vitals:   08/11/19 0500 08/11/19 1005 08/11/19 1104 08/11/19 1500  BP: (!) 134/98 (!) 182/110 (!) 182/110 (!) 163/103  Pulse: 97 80  72  Resp: 15 (!) 22  (!) 21  Temp: 98.2 F (36.8 C) 98.8 F (37.1 C)  98.9 F (37.2 C)  TempSrc: Oral Axillary  Oral  SpO2: 94% 90%  96%  Weight:      Height:        Intake/Output Summary (Last 24 hours) at 08/11/2019 1603 Last data filed at 08/11/2019 0700 Gross per 24 hour  Intake 900 ml  Output --  Net 900 ml   Filed Weights   08/08/19 1425  Weight: 44.4 kg    Examination: General exam: Conversant, in no acute distress, mucus membranes dry Respiratory system: normal chest rise, clear, no audible wheezing Cardiovascular system: regular rhythm, s1-s2 Gastrointestinal system:  Nondistended, nontender, pos BS Central nervous system: No seizures, no tremors Extremities: No cyanosis, no joint deformities Skin: No rashes, no pallor Psychiatry: Affect normal // no auditory hallucinations   Data Reviewed: I have personally reviewed following labs and imaging studies  CBC: Recent Labs  Lab 08/08/19 0459 08/09/19 0432 08/10/19 0603  WBC 13.2* 8.1 8.4  HGB 12.5 10.9* 11.9*  HCT 37.7 32.3* 36.1  MCV 101.9* 99.4 99.7  PLT 179 PLATELET CLUMPS NOTED ON SMEAR, UNABLE TO ESTIMATE 80*   Basic Metabolic Panel: Recent Labs  Lab 08/08/19 0459 08/09/19 0432 08/10/19 0603 08/11/19 0606  NA 129* 133* 133* 136  K 4.0 3.5 3.4* 3.3*  CL 94* 101 99 101  CO2 22 21* 21* 23  GLUCOSE 180* 134* 117* 156*  BUN 19 18 26* 32*  CREATININE 1.09* 0.90 0.90 0.94  CALCIUM 9.2 8.2* 8.5* 8.2*   GFR: Estimated Creatinine Clearance: 27.9 mL/min (by C-G formula based on SCr of 0.94 mg/dL). Liver Function Tests: Recent Labs  Lab 08/10/19 0603  AST 22  ALT 15  ALKPHOS 47  BILITOT 2.0*  PROT 5.7*  ALBUMIN 2.9*  No results for input(s): LIPASE, AMYLASE in the last 168 hours. No results for input(s): AMMONIA in the last 168 hours. Coagulation Profile: No results for input(s): INR, PROTIME in the last 168 hours. Cardiac Enzymes: No results for input(s): CKTOTAL, CKMB, CKMBINDEX, TROPONINI in the last 168 hours. BNP (last 3 results) No results for input(s): PROBNP in the last 8760 hours. HbA1C: No results for input(s): HGBA1C in the last 72 hours. CBG: No results for input(s): GLUCAP in the last 168 hours. Lipid Profile: No results for input(s): CHOL, HDL, LDLCALC, TRIG, CHOLHDL, LDLDIRECT in the last 72 hours. Thyroid Function Tests: No results for input(s): TSH, T4TOTAL, FREET4, T3FREE, THYROIDAB in the last 72 hours. Anemia Panel: No results for input(s): VITAMINB12, FOLATE, FERRITIN, TIBC, IRON, RETICCTPCT in the last 72 hours. Sepsis Labs: No results for input(s):  PROCALCITON, LATICACIDVEN in the last 168 hours.  Recent Results (from the past 240 hour(s))  Respiratory Panel by RT PCR (Flu A&B, Covid) - Nasopharyngeal Swab     Status: None   Collection Time: 08/08/19  9:46 AM   Specimen: Nasopharyngeal Swab  Result Value Ref Range Status   SARS Coronavirus 2 by RT PCR NEGATIVE NEGATIVE Final    Comment: (NOTE) SARS-CoV-2 target nucleic acids are NOT DETECTED. The SARS-CoV-2 RNA is generally detectable in upper respiratoy specimens during the acute phase of infection. The lowest concentration of SARS-CoV-2 viral copies this assay can detect is 131 copies/mL. A negative result does not preclude SARS-Cov-2 infection and should not be used as the sole basis for treatment or other patient management decisions. A negative result may occur with  improper specimen collection/handling, submission of specimen other than nasopharyngeal swab, presence of viral mutation(s) within the areas targeted by this assay, and inadequate number of viral copies (<131 copies/mL). A negative result must be combined with clinical observations, patient history, and epidemiological information. The expected result is Negative. Fact Sheet for Patients:  PinkCheek.be Fact Sheet for Healthcare Providers:  GravelBags.it This test is not yet ap proved or cleared by the [place] FDA and  has been authorized for detection and/or diagnosis of SARS-CoV-2 by FDA under an Emergency Use Authorization (EUA). This EUA will remain  in effect (meaning this test can be used) for the duration of the COVID-19 declaration under Section 564(b)(1) of the Act, 21 U.S.C. section 360bbb-3(b)(1), unless the authorization is terminated or revoked sooner.    Influenza A by PCR NEGATIVE NEGATIVE Final   Influenza B by PCR NEGATIVE NEGATIVE Final    Comment: (NOTE) The Xpert Xpress SARS-CoV-2/FLU/RSV assay is intended as an aid in  the  diagnosis of influenza from Nasopharyngeal swab specimens and  should not be used as a sole basis for treatment. Nasal washings and  aspirates are unacceptable for Xpert Xpress SARS-CoV-2/FLU/RSV  testing. Fact Sheet for Patients: PinkCheek.be Fact Sheet for Healthcare Providers: GravelBags.it This test is not yet approved or cleared by the [place] FDA and  has been authorized for detection and/or diagnosis of SARS-CoV-2 by  FDA under an Emergency Use Authorization (EUA). This EUA will remain  in effect (meaning this test can be used) for the duration of the  Covid-19 declaration under Section 564(b)(1) of the Act, 21  U.S.C. section 360bbb-3(b)(1), unless the authorization is  terminated or revoked. Performed at [facility], [place] [address], [place], [place] [address]      Radiology Studies: No results found.  Scheduled Meds: . acetaminophen  650 mg Oral Q6H  .  chlorhexidine  60 mL Topical Once  . ciprofloxacin-dexamethasone  4 drop Left EAR BID  . enoxaparin (LOVENOX) injection  30 mg Subcutaneous Q24H  . fentaNYL  1 patch Transdermal Q72H  . hydrALAZINE  50 mg Oral BID  . irbesartan  300 mg Oral Daily  . levothyroxine  62.5 mcg Intravenous Daily  . lidocaine  1 patch Transdermal Daily  . lidocaine  1 patch Transdermal Daily  . metoprolol tartrate  2.5 mg Intravenous Q6H  . pantoprazole (PROTONIX) IV  40 mg Intravenous Q24H  . povidone-iodine  2 application Topical Once   Continuous Infusions: . cefTRIAXone (ROCEPHIN)  IV Stopped (08/10/19 2227)  . dextrose 5 % and 0.9% NaCl 50 mL/hr at 08/11/19 1039  . metronidazole Stopped (08/11/19 0345)  . potassium chloride 10 mEq (08/11/19 1521)     LOS: 3 days   Marylu Lund, MD [facility] Pager On Amion  If 7PM-7AM, please contact night-coverage 08/11/2019, 4:03 PM

## 2019-08-11 NOTE — Progress Notes (Signed)
No adverse reactions noted at this time to rocephin

## 2019-08-11 NOTE — Plan of Care (Signed)
  Problem: Clinical Measurements: Goal: Respiratory complications will improve Outcome: Progressing Goal: Cardiovascular complication will be avoided Outcome: Not Applicable

## 2019-08-12 ENCOUNTER — Inpatient Hospital Stay (HOSPITAL_COMMUNITY): Payer: Medicare Other

## 2019-08-12 DIAGNOSIS — S82892A Other fracture of left lower leg, initial encounter for closed fracture: Secondary | ICD-10-CM

## 2019-08-12 DIAGNOSIS — T17908A Unspecified foreign body in respiratory tract, part unspecified causing other injury, initial encounter: Secondary | ICD-10-CM

## 2019-08-12 DIAGNOSIS — Z515 Encounter for palliative care: Secondary | ICD-10-CM

## 2019-08-12 DIAGNOSIS — N183 Chronic kidney disease, stage 3 unspecified: Secondary | ICD-10-CM

## 2019-08-12 LAB — COMPREHENSIVE METABOLIC PANEL
ALT: 14 U/L (ref 0–44)
AST: 28 U/L (ref 15–41)
Albumin: 2.5 g/dL — ABNORMAL LOW (ref 3.5–5.0)
Alkaline Phosphatase: 48 U/L (ref 38–126)
Anion gap: 14 (ref 5–15)
BUN: 21 mg/dL (ref 8–23)
CO2: 20 mmol/L — ABNORMAL LOW (ref 22–32)
Calcium: 8 mg/dL — ABNORMAL LOW (ref 8.9–10.3)
Chloride: 105 mmol/L (ref 98–111)
Creatinine, Ser: 0.8 mg/dL (ref 0.44–1.00)
GFR calc Af Amer: >60 mL/min (ref >60)
GFR calc non Af Amer: >60 mL/min (ref >60)
Glucose, Bld: 153 mg/dL — ABNORMAL HIGH (ref 70–99)
Potassium: 3.2 mmol/L — ABNORMAL LOW (ref 3.5–5.1)
Sodium: 139 mmol/L (ref 135–145)
Total Bilirubin: 1.5 mg/dL — ABNORMAL HIGH (ref 0.3–1.2)
Total Protein: 5.6 g/dL — ABNORMAL LOW (ref 6.5–8.1)

## 2019-08-12 MED ORDER — ONDANSETRON HCL 4 MG/2ML IJ SOLN: 4.0000 mg | Freq: Four times a day (QID) | INTRAMUSCULAR | Status: DC | PRN

## 2019-08-12 MED ORDER — POLYVINYL ALCOHOL 1.4 % OP SOLN
1.0000 [drp] | Freq: Four times a day (QID) | OPHTHALMIC | Status: DC | PRN
  Filled 2019-08-12: qty 15

## 2019-08-12 MED ORDER — LORAZEPAM 2 MG/ML IJ SOLN
0.5000 mg | Freq: Two times a day (BID) | INTRAMUSCULAR | Status: DC
  Administered 2019-08-12 – 2019-08-14 (×3): 0.5 mg via INTRAVENOUS
  Filled 2019-08-12 (×3): qty 1

## 2019-08-12 MED ORDER — LORAZEPAM 2 MG/ML IJ SOLN: 1.0000 mg | INTRAMUSCULAR | Status: DC | PRN

## 2019-08-12 MED ORDER — ACETAMINOPHEN 325 MG PO TABS: 650.0000 mg | ORAL_TABLET | Freq: Four times a day (QID) | ORAL | Status: DC | PRN

## 2019-08-12 MED ORDER — ACETAMINOPHEN 650 MG RE SUPP: 650.0000 mg | Freq: Four times a day (QID) | RECTAL | Status: DC | PRN

## 2019-08-12 MED ORDER — GLYCOPYRROLATE 1 MG PO TABS
1.0000 mg | ORAL_TABLET | ORAL | Status: DC | PRN
  Filled 2019-08-12: qty 1

## 2019-08-12 MED ORDER — BIOTENE DRY MOUTH MT LIQD: 15.0000 mL | OROMUCOSAL | Status: DC | PRN

## 2019-08-12 MED ORDER — HALOPERIDOL 0.5 MG PO TABS
0.5000 mg | ORAL_TABLET | ORAL | Status: DC | PRN
  Filled 2019-08-12: qty 1

## 2019-08-12 MED ORDER — GLYCOPYRROLATE 0.2 MG/ML IJ SOLN
0.2000 mg | INTRAMUSCULAR | Status: DC | PRN
  Filled 2019-08-12: qty 1

## 2019-08-12 MED ORDER — HALOPERIDOL LACTATE 5 MG/ML IJ SOLN: 0.5000 mg | INTRAMUSCULAR | Status: DC | PRN

## 2019-08-12 MED ORDER — MORPHINE BOLUS VIA INFUSION
2.0000 mg | INTRAVENOUS | Status: DC | PRN
  Filled 2019-08-12: qty 4

## 2019-08-12 MED ORDER — ONDANSETRON 4 MG PO TBDP: 4.0000 mg | ORAL_TABLET | Freq: Four times a day (QID) | ORAL | Status: DC | PRN

## 2019-08-12 MED ORDER — MORPHINE 100MG IN NS 100ML (1MG/ML) PREMIX INFUSION
2.5000 mg/h | INTRAVENOUS | Status: DC
  Administered 2019-08-12 – 2019-08-14 (×2): 2.5 mg/h via INTRAVENOUS
  Filled 2019-08-12 (×2): qty 100

## 2019-08-12 MED ORDER — LORAZEPAM 1 MG PO TABS: 1.0000 mg | ORAL_TABLET | ORAL | Status: DC | PRN

## 2019-08-12 MED ORDER — HALOPERIDOL LACTATE 2 MG/ML PO CONC
0.5000 mg | ORAL | Status: DC | PRN
  Filled 2019-08-12: qty 0.3

## 2019-08-12 MED ORDER — LORAZEPAM 2 MG/ML PO CONC: 1.0000 mg | ORAL | Status: DC | PRN

## 2019-08-12 NOTE — Plan of Care (Addendum)
1230 Addendum: Per Bevely Palmer, MD with palliative care, no MEWS VS protocol to follow. Only comfort cares.   Pts MEWS turned yellow this AM for increased resp rate in 40's/50's. Wyline Copas, MD notified and order placed for NPO diet status. Yellow MEWS VS protocol started.   Problem: Education: Goal: Knowledge of General Education information will improve Description: Including pain rating scale, medication(s)/side effects and non-pharmacologic comfort measures Outcome: Progressing   Problem: Health Behavior/Discharge Planning: Goal: Ability to manage health-related needs will improve Outcome: Progressing   Problem: Clinical Measurements: Goal: Will remain free from infection Outcome: Progressing Goal: Respiratory complications will improve Outcome: Progressing   Problem: Activity: Goal: Risk for activity intolerance will decrease Outcome: Progressing   Problem: Nutrition: Goal: Adequate nutrition will be maintained Outcome: Progressing   Problem: Elimination: Goal: Will not experience complications related to bowel motility Outcome: Progressing   Problem: Pain Managment: Goal: General experience of comfort will improve Outcome: Progressing   Problem: Safety: Goal: Ability to remain free from injury will improve Outcome: Progressing   Problem: Skin Integrity: Goal: Risk for impaired skin integrity will decrease Outcome: Progressing

## 2019-08-12 NOTE — TOC Progression Note (Signed)
Transition of Care [facility]) - Progression Note    Patient Details  Name: Bailey [name] MRN: [id number] Date of Birth: [birth year]  Transition of Care [facility]) CM/SW Contact  Sharin Mons, RN Phone Number: 08/12/2019, 11:42 AM  Clinical Narrative:    Pt not medically ready for next level of care per MD.    Palliative consult for [place] pending.....  TOC team monitoring and following for transition of care needs.   NCM received call from [place] extended bed offer. NCM made son aware.  Updated COVID be needed for d/c to SNF.    Expected Discharge Plan: [place] Barriers to Discharge: Continued Medical Work up(aspirated, elevated RR, O2 sat's 83%, palliative to consult, [place])  Expected Discharge Plan and Services Expected Discharge Plan: [place]                                               Social Determinants of Health (SDOH) Interventions    Readmission Risk Interventions No flowsheet data found.

## 2019-08-12 NOTE — Consult Note (Signed)
Consultation Note Date: 08/12/2019   Patient Name: Bailey [name]  DOB: Oct 09, 1926  MRN: [id number]  Age / Sex: 86 y.o., female  PCP: Bailey Dad, MD Referring Physician: Donne Hazel, MD  Reason for Consultation: Establishing goals of care, Non pain symptom management, Pain control and Psychosocial/spiritual support  HPI/Patient Profile: 86 y.o. female  with past medical history of severe diffuse chronic pain, BRCA, DM, PE (not on anticoagulation) who was admitted on 08/08/2019 after a fall with a fractured ankle.  It was decided the risks of surgery outweigh the benefits and she was being managed medically.  Unfortunately she aspiration and had a difficult day on Saturday.  She appeared much better on Sunday, but today is having great difficulty with respiration.  She has decreased consciousness and it is felt she has aspirated once again.   PMT called for GOC and symptom management   Clinical Assessment and Goals of Care:  I have reviewed medical records including EPIC notes, labs and imaging, received report from Bailey [name], examined the patient and spoke on the phone with her son and POA Bailey [name] to discuss diagnosis prognosis, Sims, EOL wishes, disposition and options.  I introduced Palliative Medicine as specialized medical care for people living with serious illness. It focuses on providing relief from the symptoms and stress of a serious illness.   As far as functional and nutritional status Bailey [name] has been losing weight for the last couple of years and is down from 130 in 2019 to 97.8 pounds this admission.  Per her PCP notes she has been stating that she does not want to live anymore for some time now.    Bailey [name] has cared for multiple family members.  He also explained that his wife is an internal medicine MD at [employer].  He understands a good deal of information about end of  life.  Bailey [name] was very aware of his mother's condition and wishes.  His primary concern appeared to be her comfort.  We discussed discontinuing her oral medications (as she is not alert enough to swallow) and starting a morphine gtt for pain and dyspnea.  I mentioned that she does not appear stable for transport today but we will reassess daily to determine if she is stable for transport to a [place] if that is in line with Bailey [name] wishes for his mother.    Questions and concerns were addressed.  The family was encouraged to call with questions or concerns.    Primary Decision Maker:  HCPOA son Bailey [name]    SUMMARY OF RECOMMENDATIONS     Shift goal from stabilization to comfort.  Will change orders to reflect comfort.  Patient can not take oral medications - will shift to IV.  Remove fentanyl patch and start morphine gtt.  Reassess daily to determine if she would benefit from [facility] and whether or not she is stable for transport.  Code Status/Advance Care Planning:  DNR   Symptom Management:   As  above.  Additional Recommendations (Limitations, Scope, Preferences):  Full Comfort Care  Palliative Prophylaxis:   Aspiration  Psycho-social/Spiritual:   Desire for further Chaplaincy support: not discussed.   However family may appreciate a Chaplain visit.  Prognosis:   Days.  Discharge Planning: Anticipated Hospital Death vs Hospice House.      Primary Diagnoses: Present on Admission: . Closed left ankle fracture . Senile osteoporosis . Hypothyroidism . GERD without esophagitis . Essential hypertension . CKD (chronic kidney disease) stage 3, GFR 30-59 ml/min . Chronic Hyponatremia . Chronic pain syndrome . Leukocytosis . DNR (do not resuscitate)   I have reviewed the medical record, interviewed the patient and family, and examined the patient. The following aspects are pertinent.  Past Medical History:  Diagnosis Date  .  Acquired cyst of kidney    left  . Allergic rhinitis, cause unspecified   . Anemia, unspecified   . Chronic hyponatremia 09/05/2015  . CKD stage 2 due to type 2 diabetes mellitus ([place]) 11/18/2014  . Closed fracture of unspecified part of vertebral column without mention of spinal cord injury    T7 s/p kyphoplasty, T12  . Edema 2015  . Herpes simplex without mention of complication   . History of Epstein-Barr virus infection   . Hyposmolality and/or hyponatremia    07/13/15 Na 131  . Insomnia   . Insomnia, unspecified   . Left cavernous carotid aneurysm 08/2008   1-1/63m aneurysm of cavernous carotid artery  . Malignant neoplasm of breast (female), unspecified site 1990   left. Had surgerey and chemo, but no radiation  . 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 Neuropathy   . Osteoarthrosis of knee    bilaterally  . Osteoarthrosis, hip   . Osteoarthrosis, unspecified whether generalized or localized, forearm    bilaterally wrist  . Other and unspecified nonspecific immunological findings    positive ANA  . Other B-complex deficiencies   . Pain in joint, site unspecified    severe, diffuse, chronic pain  . Positive ANA (antinuclear antibody)   . [place]  . Pure hypercholesterolemia   . Senile osteoporosis   . Unspecified essential hypertension   . Unspecified gastritis and gastroduodenitis without mention of hemorrhage   . Unspecified hypothyroidism   . Unspecified vitamin D deficiency    Social History   Socioeconomic History  . Marital status: Widowed    Spouse name: Not on file  . Number of children: Not on file  . Years of education: Not on file  . Highest education level: Not on file  Occupational History  . Occupation: retired nMarine scientist Tobacco Use  . Smoking status: Never Smoker  . Smokeless tobacco: Never Used  Substance and Sexual Activity  . Alcohol use: Yes    Comment: 05/08/2015 "I'll have  a glass of white wine q now and then"  . Drug use: No  . Sexual activity: Never  Other Topics Concern  . Not on file  Social History Narrative   [facility] [place]   Married 1968   Never smoked   No alcohol    Exercise walking, walks with cane   POA/Living Will            Social Determinants of Health   Financial Resource Strain:   . Difficulty of Paying Living Expenses:   Food Insecurity:   . Worried About RCharity fundraiserin  the Last Year:   . [place] in the Last Year:   Transportation Needs:   . Film/video editor (Medical):   Marland Kitchen Lack of Transportation (Non-Medical):   Physical Activity:   . Days of Exercise per Week:   . Minutes of Exercise per Session:   Stress:   . Feeling of Stress :   Social Connections:   . Frequency of Communication with Friends and Family:   . Frequency of Social Gatherings with Friends and Family:   . Attends Religious Services:   . Active Member of Clubs or Organizations:   . Attends Archivist Meetings:   Marland Kitchen Marital Status:    No family history on file. Scheduled Meds: . acetaminophen  650 mg Oral Q6H  . chlorhexidine  60 mL Topical Once  . ciprofloxacin-dexamethasone  4 drop Left EAR BID  . fentaNYL  1 patch Transdermal Q72H  . lidocaine  1 patch Transdermal Daily  . lidocaine  1 patch Transdermal Daily  . metoprolol tartrate  2.5 mg Intravenous Q6H  . pantoprazole (PROTONIX) IV  40 mg Intravenous Q24H  . povidone-iodine  2 application Topical Once   Continuous Infusions: . morphine     PRN Meds:.0.9 % irrigation (POUR BTL), acetaminophen **OR** acetaminophen, antiseptic oral rinse, glycopyrrolate **OR** glycopyrrolate **OR** glycopyrrolate, haloperidol **OR** haloperidol **OR** haloperidol lactate, ipratropium, ketorolac, LORazepam **OR** LORazepam **OR** LORazepam, morphine, ondansetron **OR** ondansetron (ZOFRAN) IV, polyvinyl alcohol, zinc oxide Allergies  Allergen  Reactions  . Aspirin     Drop in body temp with large quantities, can tolerate low doses of aspirin  . Hctz [Hydrochlorothiazide] Other (See Comments)    Hyponatremia  . Penicillins Swelling    Has patient had a PCN reaction causing immediate rash, facial/tongue/throat swelling, SOB or lightheadedness with hypotension: No Has patient had a PCN reaction causing severe rash involving mucus membranes or skin necrosis: No Has patient had a PCN reaction that required hospitalization No Has patient had a PCN reaction occurring within the last 10 years: No If all of the above answers are "NO", then may proceed with Cephalosporin use.    Review of Systems  Patient unable to provide.  Physical Exam  Cachectic elderly female, sleeping.  She does not wake to my voice or exam CV irreg irreg resp shallow, rapid abodmen soft, thin, NT   Vital Signs: BP (!) 128/94 (BP Location: Right Arm)   Pulse 98   Temp 98.7 F (37.1 C) (Oral)   Resp (!) 44   Ht '5\' 8"'  (1.727 m)   Wt 44.4 kg   SpO2 99%   BMI 14.88 kg/m  Pain Scale: Faces   Pain Score: 8    SpO2: SpO2: 99 % O2 Device:SpO2: 99 % O2 Flow Rate: .O2 Flow Rate (L/min): 2 L/min(per palliative care Bevely Palmer, MD)  IO: Intake/output summary:   Intake/Output Summary (Last 24 hours) at 08/12/2019 1229 Last data filed at 08/12/2019 0500 Gross per 24 hour  Intake 932.75 ml  Output -  Net 932.75 ml    LBM: Last BM Date: 08/07/19 Baseline Weight: Weight: 44.4 kg Most recent weight: Weight: 44.4 kg     Palliative Assessment/Data: 10%     Time In: 12:00 Time Out: 12:45 Time Total: 45 min. Visit consisted of counseling and education dealing with the complex and emotionally intense issues surrounding the need for palliative care and symptom management in the setting of serious and potentially life-threatening illness. Greater than 50%  of this time was  spent counseling and coordinating care related to the above assessment and plan.   Signed by: Florentina Jenny, PA-C Palliative Medicine  Please contact Palliative Medicine Team phone at [phone] for questions and concerns.  For individual provider: See Shea Evans

## 2019-08-12 NOTE — Progress Notes (Signed)
PROGRESS NOTE    Bailey [name]  ZJI:[id number] DOB: 07/13/1926 DOA: 08/08/2019 PCP: Virgie Dad, MD    Brief Narrative:  86yo with hx HTN, DM, CKD stage 2, hypothyroid, hx PE presented with fall with severe L ankle pain. Pt found to have bimalleolar fracture of L ankle. Orthopedic Surgery consulted. Ortho recs for splinting. PT/OT consulted.  Assessment & Plan:   Principal Problem:   Closed left ankle fracture Active Problems:   Senile osteoporosis   Hypothyroidism   GERD without esophagitis   Chronic pain syndrome   Essential hypertension   CKD (chronic kidney disease) stage 3, GFR 30-59 ml/min   Chronic Hyponatremia   History of pulmonary embolus (PE)   Leukocytosis   DNR (do not resuscitate)   Aspiration into airway   Comfort measures only status   Palliative care encounter  Left ankle fracture secondary to fall:  -S/p mechanical fall while using the restroom.   -Found to have a bimalleolar left ankle fracture.  -Orthopedic Surgery was consulted, later recommending splinting and non-surgical management as risk may not outweigh benefit to surgery -Initially was planned for SNF placement, however patient acutely declined, now full comfort status, see below  Leukocytosis: Acute.   -Presenting WBC elevated at 13.2.  Suspect that this is secondary to neutrophil marginalization related to the fracture. -Normalized -Hold further blood draws given transition to comfort status  Hyponatremia: .   -Sodium on presentation of 129 which appears to be near her baseline which ranges from 129-131.   -She takes sodium chloride tablets 1 g twice daily -Sodium levels did normalize  Chronic pain on chronic opioids:  -Patient with long history of chronic pain due to arthritis.   -See below, now on comfort measures and on morphine gtt  Essential hypertension:  -Now on full comfort measures -continued on IV metoprolol  Chronic kidney disease stage IIIa:  -Patient creatinine  on admission 1.09.  Baseline creatinine appears to be around 0.9-1. -renal function did improve -Will no longer follow renal function given comfort care status  Diabetes mellitus type 2:  -glucose had been stable on SSI coverage -Pt now comfort status  Hypothyroidism: Last TSH noted to be 2.73 on 07/16/2018. -TSH stable at 0.53 -now comofort status, levothyroxine had been stopped  Osteoporosis: Patient with previous fracture of the left femur over 10 years ago.  GERD  -Home medications include Protonix 40 mg daily and Pepcid 10 mg nightly -Continue home regimen  History of breast cancer: Patient status post left mastectomy.  History of PE: Patient not on anticoagulation. Now comfort status  Dysphagia with aspriration -Pt now s/p MBS, found to have very suboptimal findings, concerns of aspiration -SLP recs for trial of dysphagia diet. On 3/27, patient noted to have acutely worsened resp effort and O2 requirements made worse with PO intake, highly suggestive of aspiration event -Started empiric cefepime and flagyl for aspiration PNA -Given trial of dysphagia 1 diet, however on the AM of 3/29, patient noted to have markedly worse resp effort with RR in the 50's -Have ordered and reviewed CXR with findings worrisome for aspiration -Appreciate input by Palliative Care. See below -Now comfort care  Dehydration -given IVF this visit -Now comfort status  End of Life -Appreciate input by Palliative Care -Patient is now actively decompensating with concerns of on-going aspiration -Palliative Care met with family and decision now to transition to full comfort measures -Pt DNR, now comfort care. If patient remains stable for transport in AM, consider d/c  to residential hospice at that time  DVT prophylaxis: comfort care Code Status: DNR, comfort care Family Communication: pt in room, family not at bedside Disposition Plan: From home. Anticipate hospital death vs residential  hospice if pt survives the night  Consultants:   Orthopedic Surgery  Palliative Care  Procedures:     Antimicrobials: Anti-infectives (From admission, onward)   Start     Dose/Rate Route Frequency Ordered Stop   08/10/19 1930  cefTRIAXone (ROCEPHIN) 1 g in sodium chloride 0.9 % 100 mL IVPB  Status:  Discontinued     1 g 200 mL/hr over 30 Minutes Intravenous Every 24 hours 08/10/19 1858 08/12/19 1219   08/10/19 1930  metroNIDAZOLE (FLAGYL) IVPB 500 mg  Status:  Discontinued     500 mg 100 mL/hr over 60 Minutes Intravenous Every 8 hours 08/10/19 1858 08/12/19 1219   08/08/19 1215  clindamycin (CLEOCIN) IVPB 900 mg     900 mg 100 mL/hr over 30 Minutes Intravenous To Short Stay 08/08/19 1152 08/09/19 1215      Subjective: Unable to assess given mentation  Objective: Vitals:   08/12/19 0750 08/12/19 1059 08/12/19 1110 08/12/19 1501  BP: (!) 122/94  (!) 128/94 (!) 121/109  Pulse: (!) 108  98 90  Resp:  (!) 56 (!) 44 20  Temp:   98.7 F (37.1 C) 98.1 F (36.7 C)  TempSrc:   Oral Oral  SpO2:   99% 98%  Weight:      Height:        Intake/Output Summary (Last 24 hours) at 08/12/2019 1519 Last data filed at 08/12/2019 1505 Gross per 24 hour  Intake 1417.57 ml  Output --  Net 1417.57 ml   Filed Weights   08/08/19 1425  Weight: 44.4 kg    Examination: General exam: Asleep, laying in bed, in nad Respiratory system: Increased respiratory effort, no wheezing Cardiovascular system: perfused, s1, s2 Gastrointestinal system: Soft, nondistended, positive BS Central nervous system: CN2-12 grossly intact, no tremors Extremities: Perfused, no clubbing Skin: Normal skin turgor, no notable skin lesions seen Psychiatry: unable to assess given current mentation  Data Reviewed: I have personally reviewed following labs and imaging studies  CBC: Recent Labs  Lab 08/08/19 0459 08/09/19 0432 08/10/19 0603  WBC 13.2* 8.1 8.4  HGB 12.5 10.9* 11.9*  HCT 37.7 32.3* 36.1  MCV  101.9* 99.4 99.7  PLT 179 PLATELET CLUMPS NOTED ON SMEAR, UNABLE TO ESTIMATE 80*   Basic Metabolic Panel: Recent Labs  Lab 08/08/19 0459 08/09/19 0432 08/10/19 0603 08/11/19 0606 08/12/19 0424  NA 129* 133* 133* 136 139  K 4.0 3.5 3.4* 3.3* 3.2*  CL 94* 101 99 101 105  CO2 22 21* 21* 23 20*  GLUCOSE 180* 134* 117* 156* 153*  BUN 19 18 26* 32* 21  CREATININE 1.09* 0.90 0.90 0.94 0.80  CALCIUM 9.2 8.2* 8.5* 8.2* 8.0*   GFR: Estimated Creatinine Clearance: 32.8 mL/min (by C-G formula based on SCr of 0.8 mg/dL). Liver Function Tests: Recent Labs  Lab 08/10/19 0603 08/12/19 0424  AST 22 28  ALT 15 14  ALKPHOS 47 48  BILITOT 2.0* 1.5*  PROT 5.7* 5.6*  ALBUMIN 2.9* 2.5*   No results for input(s): LIPASE, AMYLASE in the last 168 hours. No results for input(s): AMMONIA in the last 168 hours. Coagulation Profile: No results for input(s): INR, PROTIME in the last 168 hours. Cardiac Enzymes: No results for input(s): CKTOTAL, CKMB, CKMBINDEX, TROPONINI in the last 168 hours. BNP (last 3 results)  No results for input(s): PROBNP in the last 8760 hours. HbA1C: No results for input(s): HGBA1C in the last 72 hours. CBG: No results for input(s): GLUCAP in the last 168 hours. Lipid Profile: No results for input(s): CHOL, HDL, LDLCALC, TRIG, CHOLHDL, LDLDIRECT in the last 72 hours. Thyroid Function Tests: No results for input(s): TSH, T4TOTAL, FREET4, T3FREE, THYROIDAB in the last 72 hours. Anemia Panel: No results for input(s): VITAMINB12, FOLATE, FERRITIN, TIBC, IRON, RETICCTPCT in the last 72 hours. Sepsis Labs: No results for input(s): PROCALCITON, LATICACIDVEN in the last 168 hours.  Recent Results (from the past 240 hour(s))  Respiratory Panel by RT PCR (Flu A&B, Covid) - Nasopharyngeal Swab     Status: None   Collection Time: 08/08/19  9:46 AM   Specimen: Nasopharyngeal Swab  Result Value Ref Range Status   SARS Coronavirus 2 by RT PCR NEGATIVE NEGATIVE Final     Comment: (NOTE) SARS-CoV-2 target nucleic acids are NOT DETECTED. The SARS-CoV-2 RNA is generally detectable in upper respiratoy specimens during the acute phase of infection. The lowest concentration of SARS-CoV-2 viral copies this assay can detect is 131 copies/mL. A negative result does not preclude SARS-Cov-2 infection and should not be used as the sole basis for treatment or other patient management decisions. A negative result may occur with  improper specimen collection/handling, submission of specimen other than nasopharyngeal swab, presence of viral mutation(s) within the areas targeted by this assay, and inadequate number of viral copies (<131 copies/mL). A negative result must be combined with clinical observations, patient history, and epidemiological information. The expected result is Negative. Fact Sheet for Patients:  PinkCheek.be Fact Sheet for Healthcare Providers:  GravelBags.it This test is not yet ap proved or cleared by the [place] FDA and  has been authorized for detection and/or diagnosis of SARS-CoV-2 by FDA under an Emergency Use Authorization (EUA). This EUA will remain  in effect (meaning this test can be used) for the duration of the COVID-19 declaration under Section 564(b)(1) of the Act, 21 U.S.C. section 360bbb-3(b)(1), unless the authorization is terminated or revoked sooner.    Influenza A by PCR NEGATIVE NEGATIVE Final   Influenza B by PCR NEGATIVE NEGATIVE Final    Comment: (NOTE) The Xpert Xpress SARS-CoV-2/FLU/RSV assay is intended as an aid in  the diagnosis of influenza from Nasopharyngeal swab specimens and  should not be used as a sole basis for treatment. Nasal washings and  aspirates are unacceptable for Xpert Xpress SARS-CoV-2/FLU/RSV  testing. Fact Sheet for Patients: PinkCheek.be Fact Sheet for Healthcare  Providers: GravelBags.it This test is not yet approved or cleared by the [place] FDA and  has been authorized for detection and/or diagnosis of SARS-CoV-2 by  FDA under an Emergency Use Authorization (EUA). This EUA will remain  in effect (meaning this test can be used) for the duration of the  Covid-19 declaration under Section 564(b)(1) of the Act, 21  U.S.C. section 360bbb-3(b)(1), unless the authorization is  terminated or revoked. Performed at [facility], [place] [address] [place], [place] [address]      Radiology Studies: DG CHEST PORT 1 VIEW  Result Date: 08/12/2019 CLINICAL DATA:  Hypoxia EXAM: PORTABLE CHEST 1 VIEW COMPARISON:  08/08/2019 FINDINGS: Stable cardiomediastinal contours. There are hyperdense airspace opacities within the mid to lower right lung, new from prior compatible with aspirated barium from recent swallowing evaluation. No pneumothorax. Numerous thoracic vertebral body compression deformities, 2 of which are status post cement augmentation. IMPRESSION: New hyperdense airspace  opacities within the mid to lower right lung compatible with aspirated barium from recent swallowing evaluation. Electronically Signed   By: Davina Poke D.O.   On: 08/12/2019 11:35    Scheduled Meds: . acetaminophen  650 mg Oral Q6H  . chlorhexidine  60 mL Topical Once  . ciprofloxacin-dexamethasone  4 drop Left EAR BID  . lidocaine  1 patch Transdermal Daily  . lidocaine  1 patch Transdermal Daily  . LORazepam  0.5 mg Intravenous BID  . metoprolol tartrate  2.5 mg Intravenous Q6H  . pantoprazole (PROTONIX) IV  40 mg Intravenous Q24H  . povidone-iodine  2 application Topical Once   Continuous Infusions: . morphine       LOS: 4 days   Marylu Lund, MD [facility] Pager On Amion  If 7PM-7AM, please contact night-coverage 08/12/2019, 3:19 PM

## 2019-08-12 NOTE — Progress Notes (Signed)
PT Cancellation Note  Patient Details Name: Bailey [name] MRN: [id number] DOB: 1926/08/30   Cancelled Treatment:    Reason Eval/Treat Not Completed: Medical issues which prohibited therapy(on arrival pt appears fatigued with tachypnea. SpO2 83% on RA with 2L applied achieved 89%3L to get 93%. Pt with RR 50 reporting pain and difficulty breathing. RN notified and present. Will defer session at this time)   [name] 08/12/2019, 9:16 AM  [name], PT Acute Rehabilitation Services Pager: [phone] Office: [phone]

## 2019-08-13 DIAGNOSIS — Z66 Do not resuscitate: Secondary | ICD-10-CM

## 2019-08-13 NOTE — Progress Notes (Signed)
Nutrition Brief Note  Chart reviewed. Pt now transitioning to comfort care.  No further nutrition interventions warranted at this time.  Please re-consult as needed.   Chae Shuster W, RD, LDN, CDCES Registered Dietitian II Certified Diabetes Care and Education Specialist Please refer to AMION for RD and/or RD on-call/weekend/after hours pager  

## 2019-08-13 NOTE — Plan of Care (Signed)
  Problem: Education: Goal: Knowledge of General Education information will improve Description: Including pain rating scale, medication(s)/side effects and non-pharmacologic comfort measures Outcome: Progressing   Problem: Health Behavior/Discharge Planning: Goal: Ability to manage health-related needs will improve Outcome: Progressing   Problem: Clinical Measurements: Goal: Will remain free from infection Outcome: Progressing Goal: Respiratory complications will improve Outcome: Progressing   Problem: Activity: Goal: Risk for activity intolerance will decrease Outcome: Progressing   Problem: Nutrition: Goal: Adequate nutrition will be maintained Outcome: Progressing   Problem: Coping: Goal: Level of anxiety will decrease Outcome: Progressing   Problem: Elimination: Goal: Will not experience complications related to bowel motility Outcome: Progressing   Problem: Pain Managment: Goal: General experience of comfort will improve Outcome: Progressing   Problem: Safety: Goal: Ability to remain free from injury will improve Outcome: Progressing   Problem: Skin Integrity: Goal: Risk for impaired skin integrity will decrease Outcome: Progressing   

## 2019-08-13 NOTE — Progress Notes (Signed)
NCM received consult : Son interested in [facility]. NCM called and spoke with son to confirm. Son stated he would like for mom to d/c to [facility] after speaking with mom this evening, confirming mom's wishes for EOL. Pt currently groggy,lethargic. Referral made to [facility] for residential hospice care. TOC team will continue monitor and follow for needs. Whitman Hero RN,BSN,CM

## 2019-08-13 NOTE — Progress Notes (Signed)
PROGRESS NOTE    Bailey [name]  NLG:[id number] DOB: 1926-06-27 DOA: 08/08/2019 PCP: Virgie Dad, MD    Brief Narrative:  86yo with hx HTN, DM, CKD stage 2, hypothyroid, hx PE presented with fall with severe L ankle pain. Pt found to have bimalleolar fracture of L ankle. Orthopedic Surgery consulted. Ortho recs for splinting. PT/OT consulted.  Assessment & Plan:   Principal Problem:   Closed left ankle fracture Active Problems:   Senile osteoporosis   Hypothyroidism   GERD without esophagitis   Chronic pain syndrome   Essential hypertension   CKD (chronic kidney disease) stage 3, GFR 30-59 ml/min   Chronic Hyponatremia   History of pulmonary embolus (PE)   Leukocytosis   DNR (do not resuscitate)   Aspiration into airway   Comfort measures only status   Palliative care encounter  Left ankle fracture secondary to fall:  -S/p mechanical fall while using the restroom.   -Found to have a bimalleolar left ankle fracture.  -Orthopedic Surgery was consulted, later recommending splinting and non-surgical management as risk may not outweigh benefit to surgery -Initially was planned for SNF placement, however patient acutely declined on 3/29, now full comfort status, see below  Leukocytosis: Acute.   -Presenting WBC elevated at 13.2.  Suspect that this is secondary to neutrophil marginalization related to the fracture. -Normalized -Cont to hold further blood draws given transition to comfort status  Hyponatremia: .   -Sodium on presentation of 129 which appears to be near her baseline which ranges from 129-131.   -She takes sodium chloride tablets 1 g twice daily -Sodium levels did normalize. Would hold off on further blood draws  Chronic pain on chronic opioids:  -Patient with long history of chronic pain due to arthritis.   -See below, remains on comfort measures and on morphine gtt  Essential hypertension:  -Now on full comfort measures -continued on IV metoprolol as  tolerated  Chronic kidney disease stage IIIa:  -Patient creatinine on admission 1.09.  Baseline creatinine appears to be around 0.9-1. -renal function did improve with hydration -Will no longer follow renal function given comfort care status  Diabetes mellitus type 2:  -glucose had been stable on SSI coverage -Pt now comfort status only  Hypothyroidism: Last TSH noted to be 2.73 on 07/16/2018. -TSH stable at 0.53 -now comofort status, levothyroxine had been stopped  Osteoporosis: Patient with previous fracture of the left femur over 10 years ago.  GERD  -Home medications include Protonix 40 mg daily and Pepcid 10 mg nightly -Continue home regimen  History of breast cancer: Patient status post left mastectomy.  History of PE: Patient not on anticoagulation. Now comfort status  Dysphagia with aspriration -Pt now s/p MBS, found to have very suboptimal findings, concerns of aspiration -SLP recs for trial of dysphagia diet. On 3/27, patient noted to have acutely worsened resp effort and O2 requirements made worse with PO intake, highly suggestive of aspiration event -Started empiric cefepime and flagyl for aspiration PNA -Given trial of dysphagia 1 diet, however on the AM of 3/29, patient noted to have markedly worse resp effort with RR in the 50's -Have ordered and reviewed CXR with findings worrisome for aspiration -Appreciate input by Palliative Care. See below -Now comfort care. Family to discuss with pt later today, considering transfer to residential hospice  Dehydration -given IVF this visit -Now comfort status  End of Life -Appreciate input by Palliative Care -Patient is now actively decompensating with concerns of on-going aspiration -Palliative  Care met with family and decision now to transition to full comfort measures -Pt DNR, now comfort care. See above, possible transition to residential hospice in the next 24hrs  DVT prophylaxis: comfort care Code Status:  DNR, comfort care Family Communication: pt in room, family not at bedside Disposition Plan: From home. Plan possible residential hospice in 24hrs after family decides on dispo  Consultants:   Orthopedic Surgery  Palliative Care  Procedures:     Antimicrobials: Anti-infectives (From admission, onward)   Start     Dose/Rate Route Frequency Ordered Stop   08/10/19 1930  cefTRIAXone (ROCEPHIN) 1 g in sodium chloride 0.9 % 100 mL IVPB  Status:  Discontinued     1 g 200 mL/hr over 30 Minutes Intravenous Every 24 hours 08/10/19 1858 08/12/19 1219   08/10/19 1930  metroNIDAZOLE (FLAGYL) IVPB 500 mg  Status:  Discontinued     500 mg 100 mL/hr over 60 Minutes Intravenous Every 8 hours 08/10/19 1858 08/12/19 1219   08/08/19 1215  clindamycin (CLEOCIN) IVPB 900 mg     900 mg 100 mL/hr over 30 Minutes Intravenous To Short Stay 08/08/19 1152 08/09/19 1215      Subjective: Unable to assess given current mentation  Objective: Vitals:   08/12/19 1059 08/12/19 1110 08/12/19 1501 08/13/19 0300  BP:  (!) 128/94 (!) 121/109 (!) 148/101  Pulse:  98 90 97  Resp: (!) 56 (!) 44 20 (!) 26  Temp:  98.7 F (37.1 C) 98.1 F (36.7 C) 99.6 F (37.6 C)  TempSrc:  Oral Oral Oral  SpO2:  99% 98% 92%  Weight:      Height:        Intake/Output Summary (Last 24 hours) at 08/13/2019 1625 Last data filed at 08/13/2019 1300 Gross per 24 hour  Intake 0 ml  Output --  Net 0 ml   Filed Weights   08/08/19 1425  Weight: 44.4 kg    Examination: General exam: Asleep, laying in bed, in nad Respiratory system: Normal respiratory effort, no audible wheezing Cardiovascular system: perfused, no notable jvd Gastrointestinal system: Soft, nondistended Central nervous system: CN2-12 grossly intact, strength intact Extremities: Perfused, no clubbing Skin: Normal skin turgor, no notable skin lesions seen Psychiatry: Unable to assess given current mentation  Data Reviewed: I have personally reviewed  following labs and imaging studies  CBC: Recent Labs  Lab 08/08/19 0459 08/09/19 0432 08/10/19 0603  WBC 13.2* 8.1 8.4  HGB 12.5 10.9* 11.9*  HCT 37.7 32.3* 36.1  MCV 101.9* 99.4 99.7  PLT 179 PLATELET CLUMPS NOTED ON SMEAR, UNABLE TO ESTIMATE 80*   Basic Metabolic Panel: Recent Labs  Lab 08/08/19 0459 08/09/19 0432 08/10/19 0603 08/11/19 0606 08/12/19 0424  NA 129* 133* 133* 136 139  K 4.0 3.5 3.4* 3.3* 3.2*  CL 94* 101 99 101 105  CO2 22 21* 21* 23 20*  GLUCOSE 180* 134* 117* 156* 153*  BUN 19 18 26* 32* 21  CREATININE 1.09* 0.90 0.90 0.94 0.80  CALCIUM 9.2 8.2* 8.5* 8.2* 8.0*   GFR: Estimated Creatinine Clearance: 32.8 mL/min (by C-G formula based on SCr of 0.8 mg/dL). Liver Function Tests: Recent Labs  Lab 08/10/19 0603 08/12/19 0424  AST 22 28  ALT 15 14  ALKPHOS 47 48  BILITOT 2.0* 1.5*  PROT 5.7* 5.6*  ALBUMIN 2.9* 2.5*   No results for input(s): LIPASE, AMYLASE in the last 168 hours. No results for input(s): AMMONIA in the last 168 hours. Coagulation Profile: No results for  input(s): INR, PROTIME in the last 168 hours. Cardiac Enzymes: No results for input(s): CKTOTAL, CKMB, CKMBINDEX, TROPONINI in the last 168 hours. BNP (last 3 results) No results for input(s): PROBNP in the last 8760 hours. HbA1C: No results for input(s): HGBA1C in the last 72 hours. CBG: No results for input(s): GLUCAP in the last 168 hours. Lipid Profile: No results for input(s): CHOL, HDL, LDLCALC, TRIG, CHOLHDL, LDLDIRECT in the last 72 hours. Thyroid Function Tests: No results for input(s): TSH, T4TOTAL, FREET4, T3FREE, THYROIDAB in the last 72 hours. Anemia Panel: No results for input(s): VITAMINB12, FOLATE, FERRITIN, TIBC, IRON, RETICCTPCT in the last 72 hours. Sepsis Labs: No results for input(s): PROCALCITON, LATICACIDVEN in the last 168 hours.  Recent Results (from the past 240 hour(s))  Respiratory Panel by RT PCR (Flu A&B, Covid) - Nasopharyngeal Swab      Status: None   Collection Time: 08/08/19  9:46 AM   Specimen: Nasopharyngeal Swab  Result Value Ref Range Status   SARS Coronavirus 2 by RT PCR NEGATIVE NEGATIVE Final    Comment: (NOTE) SARS-CoV-2 target nucleic acids are NOT DETECTED. The SARS-CoV-2 RNA is generally detectable in upper respiratoy specimens during the acute phase of infection. The lowest concentration of SARS-CoV-2 viral copies this assay can detect is 131 copies/mL. A negative result does not preclude SARS-Cov-2 infection and should not be used as the sole basis for treatment or other patient management decisions. A negative result may occur with  improper specimen collection/handling, submission of specimen other than nasopharyngeal swab, presence of viral mutation(s) within the areas targeted by this assay, and inadequate number of viral copies (<131 copies/mL). A negative result must be combined with clinical observations, patient history, and epidemiological information. The expected result is Negative. Fact Sheet for Patients:  PinkCheek.be Fact Sheet for Healthcare Providers:  GravelBags.it This test is not yet ap proved or cleared by the [place] FDA and  has been authorized for detection and/or diagnosis of SARS-CoV-2 by FDA under an Emergency Use Authorization (EUA). This EUA will remain  in effect (meaning this test can be used) for the duration of the COVID-19 declaration under Section 564(b)(1) of the Act, 21 U.S.C. section 360bbb-3(b)(1), unless the authorization is terminated or revoked sooner.    Influenza A by PCR NEGATIVE NEGATIVE Final   Influenza B by PCR NEGATIVE NEGATIVE Final    Comment: (NOTE) The Xpert Xpress SARS-CoV-2/FLU/RSV assay is intended as an aid in  the diagnosis of influenza from Nasopharyngeal swab specimens and  should not be used as a sole basis for treatment. Nasal washings and  aspirates are unacceptable for  Xpert Xpress SARS-CoV-2/FLU/RSV  testing. Fact Sheet for Patients: PinkCheek.be Fact Sheet for Healthcare Providers: GravelBags.it This test is not yet approved or cleared by the [place] FDA and  has been authorized for detection and/or diagnosis of SARS-CoV-2 by  FDA under an Emergency Use Authorization (EUA). This EUA will remain  in effect (meaning this test can be used) for the duration of the  Covid-19 declaration under Section 564(b)(1) of the Act, 21  U.S.C. section 360bbb-3(b)(1), unless the authorization is  terminated or revoked. Performed at [facility], [place] [address] [place], [place] [address]      Radiology Studies: DG CHEST PORT 1 VIEW  Result Date: 08/12/2019 CLINICAL DATA:  Hypoxia EXAM: PORTABLE CHEST 1 VIEW COMPARISON:  08/08/2019 FINDINGS: Stable cardiomediastinal contours. There are hyperdense airspace opacities within the mid to lower right lung, new from prior compatible  with aspirated barium from recent swallowing evaluation. No pneumothorax. Numerous thoracic vertebral body compression deformities, 2 of which are status post cement augmentation. IMPRESSION: New hyperdense airspace opacities within the mid to lower right lung compatible with aspirated barium from recent swallowing evaluation. Electronically Signed   By: Davina Poke D.O.   On: 08/12/2019 11:35    Scheduled Meds: . acetaminophen  650 mg Oral Q6H  . chlorhexidine  60 mL Topical Once  . ciprofloxacin-dexamethasone  4 drop Left EAR BID  . lidocaine  1 patch Transdermal Daily  . lidocaine  1 patch Transdermal Daily  . LORazepam  0.5 mg Intravenous BID  . metoprolol tartrate  2.5 mg Intravenous Q6H  . pantoprazole (PROTONIX) IV  40 mg Intravenous Q24H  . povidone-iodine  2 application Topical Once   Continuous Infusions: . morphine 2.5 mg/hr (08/12/19 1516)     LOS: 5 days   Marylu Lund, MD [facility] Pager  On Amion  If 7PM-7AM, please contact night-coverage 08/13/2019, 4:25 PM

## 2019-08-13 NOTE — Progress Notes (Signed)
Daily Progress Note   Patient Name: Bailey [name]       Date: 08/13/2019 DOB: 1926-07-22  Age: 86 y.o. MRN#: [id number] Attending Physician: Donne Hazel, MD Primary Care Physician: Virgie Dad, MD Admit Date: 08/08/2019  Reason for Consultation/Follow-up: Establishing goals of care, Hospice Evaluation and Terminal Care  Subjective: Appears comfortable, wakes up to stimulation - utters a few unintelligible words and drifts back to sleep.   Length of Stay: 5  Current Medications: Scheduled Meds:  . acetaminophen  650 mg Oral Q6H  . chlorhexidine  60 mL Topical Once  . ciprofloxacin-dexamethasone  4 drop Left EAR BID  . lidocaine  1 patch Transdermal Daily  . lidocaine  1 patch Transdermal Daily  . LORazepam  0.5 mg Intravenous BID  . metoprolol tartrate  2.5 mg Intravenous Q6H  . pantoprazole (PROTONIX) IV  40 mg Intravenous Q24H  . povidone-iodine  2 application Topical Once    Continuous Infusions: . morphine 2.5 mg/hr (08/12/19 1516)    PRN Meds: 0.9 % irrigation (POUR BTL), acetaminophen **OR** acetaminophen, antiseptic oral rinse, glycopyrrolate **OR** glycopyrrolate **OR** glycopyrrolate, haloperidol **OR** haloperidol **OR** haloperidol lactate, ipratropium, ketorolac, LORazepam **OR** LORazepam **OR** LORazepam, morphine, ondansetron **OR** ondansetron (ZOFRAN) IV, polyvinyl alcohol, zinc oxide  Physical Exam Constitutional:      General: She is not in acute distress.    Comments: Cachectic, temporal wasting, lethargic - wakes briefly to stimulation, unintelligible speech  Pulmonary:     Effort: Pulmonary effort is normal. No respiratory distress.  Skin:    General: Skin is warm and dry.  Neurological:     Mental Status: She is disoriented.             Vital Signs: BP  (!) 148/101 (BP Location: Left Arm)   Pulse 97   Temp 99.6 F (37.6 C) (Oral)   Resp (!) 26   Ht '5\' 8"'  (1.727 m)   Wt 44.4 kg   SpO2 92%   BMI 14.88 kg/m  SpO2: SpO2: 92 % O2 Device: O2 Device: Nasal Cannula O2 Flow Rate: O2 Flow Rate (L/min): 2 L/min  Intake/output summary:   Intake/Output Summary (Last 24 hours) at 08/13/2019 1136 Last data filed at 08/13/2019 0900 Gross per 24 hour  Intake 484.82 ml  Output --  Net 484.82 ml   LBM: Last BM Date: 08/07/19 Baseline Weight: Weight: 44.4 kg Most recent weight: Weight: 44.4 kg       Palliative Assessment/Data: PPS 10%      Patient Active Problem List   Diagnosis Date Noted  . Aspiration into airway   . Comfort measures only status   . Palliative care encounter   . Closed left ankle fracture 08/08/2019  . Leukocytosis 08/08/2019  . DNR (do not resuscitate) 08/08/2019  . Advanced care planning/counseling discussion 07/26/2019  . Pneumonia 03/21/2019  . Bilateral leg edema 09/29/2018  . Recurrent major depression ([place]) 01/15/2018  . Non-seasonal allergic rhinitis 06/19/2017  . Endometrial mass 06/19/2017  . Pleuritic pain 03/30/2017  . Essential hypertension 03/30/2017  . CKD (chronic kidney disease) stage 3, GFR 30-59 ml/min 03/30/2017  . Chronic Hyponatremia 03/30/2017  . History of pulmonary embolus (PE) 03/30/2017  . History  of pulmonary embolism 02/15/2017  . Osteoporosis without current pathological fracture 02/15/2017  . B12 deficiency 02/15/2017  . Generalized arthritis 02/15/2017  . Anemia of chronic disease 09/05/2015  . Chronic pain syndrome 04/16/2015  . Fracture of multiple pubic rami ([place]) 03/30/2015  . GERD without esophagitis 03/24/2015  . Chronic constipation 03/24/2015  . Fracture, intertrochanteric, right femur ([place])   . Osteoarthrosis, forearm   . Senile osteoporosis   . Malignant neoplasm of female breast ([place])   . Insomnia   . Hypothyroidism   . Pure hypercholesterolemia   . Closed  fracture of unspecified part of vertebral column without mention of spinal cord injury   . Edema     Palliative Care Assessment & Plan   HPI: 86 y.o. female  with past medical history of severe diffuse chronic pain, BRCA, DM, PE (not on anticoagulation) who was admitted on 08/08/2019 after a fall with a fractured ankle.  It was decided the risks of surgery outweigh the benefits and she was being managed medically.  Unfortunately she aspiration and had a difficult day on Saturday.  She appeared much better on Sunday, but today is having great difficulty with respiration.  She has decreased consciousness and it is felt she has aspirated once again.   PMT called for [place] and symptom management   Assessment: Patient appears comfortable - even, unlabored respirations maintained on morphine infusion. Discussed with nursing. No concerns - good symptom management with morphine and lorazepam.   Spoke with son Rodina Pinales) - his main concern remains that his mom is comfortable and not suffering. He shares that she has always been clear that she would not want her life to be prolonged artificially. We discussed transfer to hospice facility - he is agreeable to this but would like to discuss with her as well. We did discuss that she is not very interactive - may not be able to communicate her wishes to him. He asked to hold medications as much as possible to allow for interaction. We discussed that we could continue morphine infusion and hold off on lorazepam until after he is able to speak with her as long as she does not appear to be uncomfortable. Discussed with RN as well.   Recommendations/Plan:  Referral made to [facility] for hospice facility  Continue comfort measures  Goals of Care and Additional Recommendations:  Limitations on Scope of Treatment: Full Comfort Care  Code Status:  DNR  Prognosis:   < 2 weeks  Discharge Planning:  Hospice facility  Care plan was discussed with RN, Dr.  Wyline Copas, [facility], and son Nolan Lasser  Thank you for allowing the Palliative Medicine Team to assist in the care of this patient.   Total Time 25 minutes Prolonged Time Billed  no       Greater than 50%  of this time was spent counseling and coordinating care related to the above assessment and plan.  Juel Burrow, DNP, [facility] Palliative Medicine Team Team Phone # [phone]  Pager [phone]

## 2019-08-13 NOTE — Progress Notes (Signed)
Manufacturing engineer Documentation  Liaison received referral for pt to transfer to [facility] for EOL care. Pt's chart currently under review by [facility] staff to determine eligibility.   Writer spoke with pt's son, Derald Macleod, who stated that he will speak with the pt this evening about a possible transfer to [facility]. Answered all questions he has and he has liaison's direct number should he have any further questions. Son stated that he will let us know as soon as they determine if pt is ok with [employer].   Please do not hesitate to outreach with any questions and thank you for the referral.   Freddie Breech, RN [facility] Liaison [phone]

## 2019-08-14 DIAGNOSIS — Z7189 Other specified counseling: Secondary | ICD-10-CM

## 2019-08-14 NOTE — Discharge Summary (Signed)
Physician Discharge Summary  ANDI ORDERS [id number] DOB: 1926/10/03 DOA: 08/08/2019  PCP: Virgie Dad, MD  Admit date: 08/08/2019 Discharge date: 08/14/2019  Admitted From: Home Disposition: [facility]  Recommendations for Outpatient Follow-up:  1. Follow up with PCP in 1-2 weeks 2. Please obtain BMP/CBC in one week 3. Please follow up on the following pending results:  Home Health: None Equipment/Devices: None  Discharge Condition: Poor CODE STATUS: DNR Diet recommendation: N.p.o.  Subjective: Patient seen and examined.  Remains nonverbal.  Eyes open.  She is alert.  Brief/Interim Summary: 86 year old female with past medical history of hypertension, type 2 diabetes mellitus, CKD stage II, hypothyroidism, history of PE not on any anticoagulation presented with fall and severe left ankle pain and was found to have left bimalleolar fracture of the ankle.  Orthopedic surgery was consulted.  They recommended splinting and no surgery.  Patient was seen by PT OT.  Initial recommendation and plan was to discharge her to skilled nursing facility however acutely declined on 08/12/2019 and developed severe aspiration pneumonia.  She was started on antibiotics and kept n.p.o.  MBS was done.  Found to have high aspiration risk.  She was given a trial of dysphagia diet but she aspirated again so she was kept n.p.o. 1 more time.  Due to decline, her age, palliative care was consulted who had a long discussion with the family and finally they agreed on comfort care and subsequently patient was made hospice.  She is going to be discharged to [facility] today.  She remains n.p.o. due to high aspiration risk.  I have resumed all her home medications which she was not getting here due to being NPO.  Discharge Diagnoses:  Principal Problem:   Closed left ankle fracture Active Problems:   Senile osteoporosis   Hypothyroidism   GERD without esophagitis   Chronic pain  syndrome   Essential hypertension   CKD (chronic kidney disease) stage 3, GFR 30-59 ml/min   Chronic Hyponatremia   History of pulmonary embolus (PE)   Leukocytosis   DNR (do not resuscitate)   Aspiration into airway   Comfort measures only status   Palliative care encounter    Discharge Instructions  Discharge Instructions    Discharge patient   Complete by: As directed    Discharge disposition: 50-Hospice/Home   Discharge patient date: 08/14/2019     Allergies as of 08/14/2019      Reactions   Aspirin    Drop in body temp with large quantities, can tolerate low doses of aspirin   Hctz [hydrochlorothiazide] Other (See Comments)   Hyponatremia   Penicillins Swelling   Has patient had a PCN reaction causing immediate rash, facial/tongue/throat swelling, SOB or lightheadedness with hypotension: No Has patient had a PCN reaction causing severe rash involving mucus membranes or skin necrosis: No Has patient had a PCN reaction that required hospitalization No Has patient had a PCN reaction occurring within the last 10 years: No If all of the above answers are "NO", then may proceed with Cephalosporin use.      Medication List    STOP taking these medications   fentaNYL 25 MCG/HR Commonly known as: DURAGESIC     TAKE these medications   acetaminophen 500 MG tablet Commonly known as: TYLENOL Take 500 mg by mouth every morning.   amLODipine 5 MG tablet Commonly known as: NORVASC Take 5 mg by mouth daily.   ascorbic acid 500 MG tablet Commonly known  as: VITAMIN C Take 1 tablet (500 mg total) by mouth daily.   Biofreeze 4 % Gel Generic drug: Menthol (Topical Analgesic) Apply 1 application topically 3 (three) times daily as needed. Knee and hand pain   ciprofloxacin-dexamethasone OTIC suspension Commonly known as: CIPRODEX Place 4 drops into the left ear 2 (two) times daily. X 14 days   famotidine 10 MG tablet Commonly known as: PEPCID Take 10 mg by mouth at  bedtime.   feeding supplement Liqd Take 1 Container by mouth 2 (two) times daily between meals. (1000 & 1600) Prefers Chocolate   GRX HYDROCOLLOID DRESSING EX Apply topically. Change dressing to coccyx as needed   hydrALAZINE 100 MG tablet Commonly known as: APRESOLINE Take 100 mg by mouth daily. If SBP >160 recheck B/P and document in vital sign record.   hydrALAZINE 50 MG tablet Commonly known as: APRESOLINE Take 50 mg by mouth 2 (two) times daily.   ipratropium 0.06 % nasal spray Commonly known as: ATROVENT Place 2 sprays into both nostrils 2 (two) times daily as needed for rhinitis. PRN-NASAL DRAINAGE   irbesartan 300 MG tablet Commonly known as: AVAPRO Take 300 mg by mouth daily. HOLD for B/P [id number], If systolic B/P is over 0000000, recheck B/P, document in Vital Signs.   levothyroxine 125 MCG tablet Commonly known as: SYNTHROID Take 125 mcg by mouth daily before breakfast. (0730)   Lidocaine 4 % Ptch Apply topically. Apply one patch topically to right shoulder every morning and remove every evening; resident may self apply; keep on med cart Twice A Day   Lidocaine 4 % Ptch Apply topically. Apply one patch topically to left knee every morning and remove every evening; resident may self apply; keep on med cart Twice A Day   loratadine 10 MG tablet Commonly known as: CLARITIN Take 1 tablet (10 mg total) by mouth daily.   mirtazapine 15 MG tablet Commonly known as: REMERON Take 15 mg by mouth at bedtime.   ondansetron 4 MG tablet Commonly known as: ZOFRAN Take 4 mg by mouth every 8 (eight) hours as needed for nausea or vomiting.   Oxycodone HCl 10 MG Tabs Take 10 mg by mouth every 8 (eight) hours as needed.   oxyCODONE-acetaminophen 10-325 MG tablet Commonly known as: PERCOCET Take 1 tablet by mouth 3 (three) times daily.   pantoprazole 40 MG tablet Commonly known as: PROTONIX Take 40 mg by mouth daily.   polyethylene glycol 17 g packet Commonly known as:  MIRALAX / GLYCOLAX Take 17 g by mouth daily.   promethazine 25 MG suppository Commonly known as: PHENERGAN Place 25 mg rectally every 6 (six) hours as needed for nausea or vomiting. nausea WITH vomiting x 24 hours. Clear liquid diet x24 hours. If NOT effective, notify MD.   psyllium 58.6 % powder Commonly known as: METAMUCIL Take 1 packet by mouth daily. Mix in 6 oz of water or juice   sennosides-docusate sodium 8.6-50 MG tablet Commonly known as: SENOKOT-S Take 1 tablet by mouth daily. Hold for loose stool   Shingrix injection Generic drug: Zoster Vaccine Adjuvanted Inject 0.5 mLs into the muscle once. Shingrix 0.5 ml IM x 1 repeated in 6 months Once - One Time on 09/09/2019 Start taking on: September 09, 2019   sodium chloride 1 g tablet Take 1 g by mouth 2 (two) times daily.   Vitamin D3 25 MCG tablet Commonly known as: Vitamin D Take 2 tablets (2,000 Units total) by mouth 2 (two) times daily.  zinc oxide 20 % ointment Apply 1 application topically 3 (three) times daily as needed for irritation. Apply to buttocks for redness/dark pink/excoriation      Follow-up Information    Alt[place], MD. Schedule an appointment as soon as possible for a visit on 08/21/2019.   Specialty: Orthopedic Surgery Contact information: [place] [address] [phone]        Virgie Dad, MD Follow up in 1 week(s).   Specialty: Internal Medicine Contact information: [place] [id number] [phone]          Allergies  Allergen Reactions  . Aspirin     Drop in body temp with large quantities, can tolerate low doses of aspirin  . Hctz [Hydrochlorothiazide] Other (See Comments)    Hyponatremia  . Penicillins Swelling    Has patient had a PCN reaction causing immediate rash, facial/tongue/throat swelling, SOB or lightheadedness with hypotension: No Has patient had a PCN reaction causing severe rash involving mucus membranes or skin  necrosis: No Has patient had a PCN reaction that required hospitalization No Has patient had a PCN reaction occurring within the last 10 years: No If all of the above answers are "NO", then may proceed with Cephalosporin use.    Consultations: Palliative care orthopedics   Procedures/Studies: DG Chest 2 View  Result Date: 08/08/2019 CLINICAL DATA:  Chest pain after mechanical fall EXAM: CHEST - 2 VIEW COMPARISON:  March 30, 2017 FINDINGS: The heart size and mediastinal contours are within normal limits. Aortic knob calcifications. There is slight hyperinflation of the lung zones. There is cement fixation seen within the mid and lower thoracic spine. Chronic appearing compression deformities of the midthoracic spine are seen with greater than 50% loss in vertebral body height. IMPRESSION: No active cardiopulmonary disease. Electronically Signed   By: Prudencio Pair M.D.   On: 08/08/2019 05:19   DG Ankle Complete Left  Result Date: 08/08/2019 CLINICAL DATA:  Left ankle fracture EXAM: LEFT ANKLE COMPLETE - 3+ VIEW COMPARISON:  08/08/2019 FINDINGS: Redemonstration of impaction fractures of the distal tibial metaphysis and distal fibula. No change in alignment is evident. Bones are diffusely demineralized. Overlying casting material obscures fine osseous detail. IMPRESSION: Interval casting of distal tibial and fibular fractures without change in alignment. Electronically Signed   By: Davina Poke D.O.   On: 08/08/2019 15:38   DG Ankle Complete Left  Result Date: 08/08/2019 CLINICAL DATA:  Fall and pain EXAM: LEFT ANKLE COMPLETE - 3+ VIEW COMPARISON:  None. FINDINGS: There is diffuse osteopenia. Mildly impacted fracture of the distal tibial metadiaphysis with intra-articular extension seen anteriorly on the lateral view. There is also a slightly impacted nondisplaced fracture of the distal fibula at the level of the ankle mortise. Osteoarthritis of the midfoot is seen. There is diffuse overlying  soft tissue swelling. IMPRESSION: Impacted distal tibial fracture with intra-articular extension seen anteriorly. Nondisplaced impacted distal fibular fracture. Overlying soft tissue swelling. Electronically Signed   By: Prudencio Pair M.D.   On: 08/08/2019 04:25   CT ANKLE LEFT WO CONTRAST  Result Date: 08/08/2019 CLINICAL DATA:  Left ankle fracture EXAM: CT OF THE LEFT ANKLE WITHOUT CONTRAST TECHNIQUE: Multidetector CT imaging of the left ankle was performed according to the standard protocol. Multiplanar CT image reconstructions were also generated. COMPARISON:  08/08/2019 radiographs FINDINGS: Bones/Joint/Cartilage Prominent bony demineralization. Transverse fracture of the lateral malleolus with some cortical buckling and 5 mm anterior displacement of the distal fracture fragment with mild apex  posterior angulation. Transverse fracture of the distal tibial metaphysis with mild comminution, impaction, and with extension into the articular surface primarily anteriorly and medially. Periosteal reaction posteriorly. No additional fractures the hindfoot or midfoot noted. Ligaments Suboptimally assessed by CT. Muscles and Tendons Atrophic musculature in the foot. Soft tissues Subcutaneous edema along the ankle especially medially and laterally. Low-grade edema tracks along the heel and into the dorsum of the foot. IMPRESSION: 1. Transverse fracture of the distal tibial metaphysis with mild comminution and impaction. Involvement of the tibial plafond and anteriorly and medially. 2. Transverse fracture of the lateral malleolus with some cortical buckling and 5 mm anterior displacement of the distal fracture fragment. 3. Prominent bony demineralization. 4. Subcutaneous edema along the ankle and dorsum of the foot. 5. Atrophic musculature in the foot. Electronically Signed   By: Van Clines M.D.   On: 08/08/2019 11:05   DG CHEST PORT 1 VIEW  Result Date: 08/12/2019 CLINICAL DATA:  Hypoxia EXAM: PORTABLE  CHEST 1 VIEW COMPARISON:  08/08/2019 FINDINGS: Stable cardiomediastinal contours. There are hyperdense airspace opacities within the mid to lower right lung, new from prior compatible with aspirated barium from recent swallowing evaluation. No pneumothorax. Numerous thoracic vertebral body compression deformities, 2 of which are status post cement augmentation. IMPRESSION: New hyperdense airspace opacities within the mid to lower right lung compatible with aspirated barium from recent swallowing evaluation. Electronically Signed   By: Davina Poke D.O.   On: 08/12/2019 11:35   DG CHEST PORT 1 VIEW  Result Date: 08/09/2019 CLINICAL DATA:  Aspiration EXAM: PORTABLE CHEST 1 VIEW COMPARISON:  08/08/2019 FINDINGS: Cardiomegaly. No confluent opacities, effusions or edema. No acute bony abnormality. IMPRESSION: Cardiomegaly.  No active disease. Electronically Signed   By: Rolm Baptise M.D.   On: 08/09/2019 00:23   DG Swallowing Func-Speech Pathology  Result Date: 08/09/2019 Objective Swallowing Evaluation: Type of Study: MBS-Modified Barium Swallow Study  Patient Details Name: Bailey [name] MRN: [id number] Date of Birth: June 13, 1926 Today's Date: 08/09/2019 Time: SLP Start Time (ACUTE ONLY): [id number] -SLP Stop Time (ACUTE ONLY): [id number] SLP Time Calculation (min) (ACUTE ONLY): 45 min Past Medical History: Past Medical History: Diagnosis Date . Acquired cyst of kidney   left . Allergic rhinitis, cause unspecified  . Anemia, unspecified  . Chronic hyponatremia 09/05/2015 . CKD stage 2 due to type 2 diabetes mellitus ([place]) 11/18/2014 . Closed fracture of unspecified part of vertebral column without mention of spinal cord injury   T7 s/p kyphoplasty, T12 . Edema 2015 . Herpes simplex without mention of complication  . History of Epstein-Barr virus infection  . Hyposmolality and/or hyponatremia   07/13/15 Na 131 . Insomnia  . Insomnia, unspecified  . Left cavernous carotid aneurysm 08/2008  1-1/52mm aneurysm of cavernous carotid  artery . Malignant neoplasm of breast (female), unspecified site 1990  left. Had surgerey and chemo, but no radiation . Mononeuritis of lower limb, unspecified   sensory motor neuropathy of both legs . Neuropathy  . Osteoarthrosis of knee   bilaterally . Osteoarthrosis, hip  . Osteoarthrosis, unspecified whether generalized or localized, forearm   bilaterally wrist . Other and unspecified nonspecific immunological findings   positive ANA . Other B-complex deficiencies  . Pain in joint, site unspecified   severe, diffuse, chronic pain . Positive ANA (antinuclear antibody)  . Pulmonary embolism ([place]) 05/07/2015 . Pure hypercholesterolemia  . Senile osteoporosis  . Unspecified essential hypertension  . Unspecified gastritis and gastroduodenitis without mention of hemorrhage  . Unspecified hypothyroidism  .  Unspecified vitamin D deficiency  Past Surgical History: Past Surgical History: Procedure Laterality Date . BREAST SURGERY Left  . DILATATION & CURETTAGE/HYSTEROSCOPY WITH MYOSURE N/A 06/27/2017  Procedure: DILATATION & CURETTAGE/HYSTEROSCOPY WITH MYOSURE;  Surgeon: Servando Salina, MD;  Location: [facility];  Service: Gynecology;  Laterality: N/A; . DILATION AND CURETTAGE OF UTERUS  X 2 . FEMUR IM NAIL Right 03/18/2015  Procedure: INTRAMEDULLARY (IM) NAIL FEMORAL;  Surgeon: Rod Can, MD;  Location: [facility];  Service: Orthopedics;  Laterality: Right; . FRACTURE SURGERY    hip . KYPHOPLASTY  07/03/2010  T12 . KYPHOPLASTY    C7 . LAPAROSCOPIC CHOLECYSTECTOMY  09/20/2006 . MASTECTOMY Left 04/29/1989 . MIDDLE EAR SURGERY Bilateral 1938 . NASAL SINUS SURGERY  1990 & 2000 . ORIF HIP FRACTURE Left 06/13/2007  following a syncopal episode . TONSILLECTOMY  1968 HPI: 86 y.o. female, [place] primary language, speaks fluent [place], with medical history significant for essential hypertension, DM type II, CKD stage II, hypothyroidism, chronic hyponatremia, s/p mastectomy, history of PE, chronic pain due to arthritis admitted [facility]  [facility] after falling and sustaining left ankle fracture. Plan is for non-operative tx, short leg cast and NWB for six weeks.  Pt incidentally noted to have difficulty swallowing, with wet cough observed after PO consumption. Chart review reveals clinical swallow evaluation performed 09/07/15 with findings suggesting a suspected esophageal dysphagia, but no concerns for an oropharyngeal dysphagia.   Subjective: alert, HOH Assessment / Plan / Recommendation CHL IP CLINICAL IMPRESSIONS 08/09/2019 Clinical Impression Pt presents with a primary esophageal, secondary oropharyngeal dysphagia with the primary impairment being significant barium stasis throughout the esophagus.  Pt presents with intermittent, sensed, trace aspiration of thin liquids during the swallow - she generally was able to cough and eject the aspirate from just below her vocal folds out of her larynx. (A chin tuck was not protective.) There is an oral component, with reduced oral propulsion into the pharynx and reduced base of tongue contact with the posterior pharyngeal wall.  The epiglottis does not fully invert over the larynx, and there is moderate residue that remains in the vallecular space throughout the study.  Repeated scans of the esophagus revealed ongoing stasis which did not clear.  D/W Dr. Wyline Copas and pt's son, Ozzie Hoyle, after the study.  Video was reviewed with [facility] in the pt's room.  Pt may benefit from softer foods; eating smaller, more frequent meals throughout the day.  She should take small sips of thin liquid.  Further modifications in liquids could be made, however, she still has to contend with the risk of backflow of contents of esophagus into airway.  Recommend continuing soft solids to allow more options, thin liquids; crush meds and give in puree (applesauce, pudding, etc.) Discussed recommendations with Ozzie Hoyle, who verbalized understanding. No further acute SLP f/u is recommended. Pt may benefit from SLP at SNF to  establish precautions/safe eating practices.   SLP Visit Diagnosis Dysphagia, pharyngoesophageal phase (R13.14) Attention and concentration deficit following -- Frontal lobe and executive function deficit following -- Impact on safety and function Moderate aspiration risk   CHL IP TREATMENT RECOMMENDATION 08/09/2019 Treatment Recommendations Defer treatment plan to f/u with SLP   Prognosis 08/09/2019 Prognosis for Safe Diet Advancement Guarded Barriers to Reach Goals -- Barriers/Prognosis Comment -- CHL IP DIET RECOMMENDATION 08/09/2019 SLP Diet Recommendations Dysphagia 3 (Mech soft) solids;Thin liquid Liquid Administration via Cup;Straw Medication Administration Crushed with puree Compensations Slow rate;Small sips/bites;Follow solids with liquid Postural Changes Remain semi-upright after after feeds/meals (Comment)  CHL IP OTHER RECOMMENDATIONS 08/09/2019 Recommended Consults -- Oral Care Recommendations Oral care BID Other Recommendations --   CHL IP FOLLOW UP RECOMMENDATIONS 08/09/2019 Follow up Recommendations Skilled Nursing facility   No flowsheet data found.     CHL IP ORAL PHASE 08/09/2019 Oral Phase Impaired Oral - Pudding Teaspoon -- Oral - Pudding Cup -- Oral - Honey Teaspoon -- Oral - Honey Cup -- Oral - Nectar Teaspoon -- Oral - Nectar Cup -- Oral - Nectar Straw -- Oral - Thin Teaspoon -- Oral - Thin Cup -- Oral - Thin Straw -- Oral - Puree Decreased bolus cohesion;Delayed oral transit Oral - Mech Soft Delayed oral transit;Decreased bolus cohesion Oral - Regular -- Oral - Multi-Consistency -- Oral - Pill -- Oral Phase - Comment --  CHL IP PHARYNGEAL PHASE 08/09/2019 Pharyngeal Phase Impaired Pharyngeal- Pudding Teaspoon -- Pharyngeal -- Pharyngeal- Pudding Cup -- Pharyngeal -- Pharyngeal- Honey Teaspoon -- Pharyngeal -- Pharyngeal- Honey Cup -- Pharyngeal -- Pharyngeal- Nectar Teaspoon Delayed swallow initiation-vallecula;Reduced epiglottic inversion;Reduced airway/laryngeal closure;Reduced tongue base  retraction;Pharyngeal residue - valleculae;Pharyngeal residue - pyriform Pharyngeal -- Pharyngeal- Nectar Cup -- Pharyngeal -- Pharyngeal- Nectar Straw -- Pharyngeal -- Pharyngeal- Thin Teaspoon Delayed swallow initiation-vallecula;Reduced epiglottic inversion;Reduced airway/laryngeal closure;Reduced tongue base retraction;Pharyngeal residue - valleculae;Penetration/Aspiration during swallow;Trace aspiration Pharyngeal Material enters airway, passes BELOW cords then ejected out Pharyngeal- Thin Cup -- Pharyngeal -- Pharyngeal- Thin Straw Delayed swallow initiation-vallecula;Reduced epiglottic inversion;Reduced airway/laryngeal closure;Reduced tongue base retraction;Pharyngeal residue - valleculae;Pharyngeal residue - pyriform;Trace aspiration;Penetration/Aspiration during swallow Pharyngeal Material enters airway, passes BELOW cords then ejected out;Material enters airway, passes BELOW cords and not ejected out despite cough attempt by patient Pharyngeal- Puree Delayed swallow initiation-vallecula;Reduced epiglottic inversion;Reduced airway/laryngeal closure;Reduced tongue base retraction;Pharyngeal residue - valleculae;Pharyngeal residue - pyriform Pharyngeal -- Pharyngeal- Mechanical Soft Delayed swallow initiation-vallecula;Reduced epiglottic inversion;Reduced airway/laryngeal closure;Reduced tongue base retraction;Pharyngeal residue - valleculae;Pharyngeal residue - pyriform Pharyngeal -- Pharyngeal- Regular -- Pharyngeal -- Pharyngeal- Multi-consistency -- Pharyngeal -- Pharyngeal- Pill -- Pharyngeal -- Pharyngeal Comment --  CHL IP CERVICAL ESOPHAGEAL PHASE 08/09/2019 Cervical Esophageal Phase Impaired Pudding Teaspoon -- Pudding Cup -- Honey Teaspoon -- Honey Cup -- Nectar Teaspoon -- Nectar Cup -- Nectar Straw -- Thin Teaspoon -- Thin Cup -- Thin Straw -- Puree -- Mechanical Soft -- Regular -- Multi-consistency -- Pill -- Cervical Esophageal Comment -- Juan Quam Laurice 08/09/2019, 4:11 PM               DG Femur Portable Min 2 Views Left  Result Date: 08/08/2019 CLINICAL DATA:  Pain after fall EXAM: LEFT FEMUR PORTABLE 2 VIEWS COMPARISON:  None. FINDINGS: The patient is status post ORIF with healed fracture deformity of the proximal femur. There is diffuse osteopenia. No periprosthetic lucency or acute fracture seen. There is dense vascular calcifications. Mild overlying soft tissue swelling seen at the left hip. IMPRESSION: Status post ORIF of the left proximal femur. No acute osseous abnormality. Electronically Signed   By: Prudencio Pair M.D.   On: 08/08/2019 05:56      Discharge Exam: Vitals:   08/13/19 0300 08/14/19 0445  BP: (!) 148/101 (!) 144/90  Pulse: 97 94  Resp: (!) 26 (!) 22  Temp: 99.6 F (37.6 C) 99.2 F (37.3 C)  SpO2: 92% 90%   Vitals:   08/12/19 1110 08/12/19 1501 08/13/19 0300 08/14/19 0445  BP: (!) 128/94 (!) 121/109 (!) 148/101 (!) 144/90  Pulse: 98 90 97 94  Resp: (!) 44 20 (!) 26 (!) 22  Temp: 98.7 F (37.1 C) 98.1 F (  36.7 C) 99.6 F (37.6 C) 99.2 F (37.3 C)  TempSrc: Oral Oral Oral Axillary  SpO2: 99% 98% 92% 90%  Weight:      Height:        General: Pt is alert, awake, not in acute distress Cardiovascular: RRR, S1/S2 +, no rubs, no gallops Respiratory: Diminished breath sounds at the bases, no wheezing, no rhonchi Abdominal: Soft, NT, ND, bowel sounds + Extremities: no edema, no cyanosis    The results of significant diagnostics from this hospitalization (including imaging, microbiology, ancillary and laboratory) are listed below for reference.     Microbiology: Recent Results (from the past 240 hour(s))  Respiratory Panel by RT PCR (Flu A&B, Covid) - Nasopharyngeal Swab     Status: None   Collection Time: 08/08/19  9:46 AM   Specimen: Nasopharyngeal Swab  Result Value Ref Range Status   SARS Coronavirus 2 by RT PCR NEGATIVE NEGATIVE Final    Comment: (NOTE) SARS-CoV-2 target nucleic acids are NOT DETECTED. The SARS-CoV-2 RNA is  generally detectable in upper respiratoy specimens during the acute phase of infection. The lowest concentration of SARS-CoV-2 viral copies this assay can detect is 131 copies/mL. A negative result does not preclude SARS-Cov-2 infection and should not be used as the sole basis for treatment or other patient management decisions. A negative result may occur with  improper specimen collection/handling, submission of specimen other than nasopharyngeal swab, presence of viral mutation(s) within the areas targeted by this assay, and inadequate number of viral copies (<131 copies/mL). A negative result must be combined with clinical observations, patient history, and epidemiological information. The expected result is Negative. Fact Sheet for Patients:  PinkCheek.be Fact Sheet for Healthcare Providers:  GravelBags.it This test is not yet ap proved or cleared by the [place] FDA and  has been authorized for detection and/or diagnosis of SARS-CoV-2 by FDA under an Emergency Use Authorization (EUA). This EUA will remain  in effect (meaning this test can be used) for the duration of the COVID-19 declaration under Section 564(b)(1) of the Act, 21 U.S.C. section 360bbb-3(b)(1), unless the authorization is terminated or revoked sooner.    Influenza A by PCR NEGATIVE NEGATIVE Final   Influenza B by PCR NEGATIVE NEGATIVE Final    Comment: (NOTE) The Xpert Xpress SARS-CoV-2/FLU/RSV assay is intended as an aid in  the diagnosis of influenza from Nasopharyngeal swab specimens and  should not be used as a sole basis for treatment. Nasal washings and  aspirates are unacceptable for Xpert Xpress SARS-CoV-2/FLU/RSV  testing. Fact Sheet for Patients: PinkCheek.be Fact Sheet for Healthcare Providers: GravelBags.it This test is not yet approved or cleared by the [place] FDA and   has been authorized for detection and/or diagnosis of SARS-CoV-2 by  FDA under an Emergency Use Authorization (EUA). This EUA will remain  in effect (meaning this test can be used) for the duration of the  Covid-19 declaration under Section 564(b)(1) of the Act, 21  U.S.C. section 360bbb-3(b)(1), unless the authorization is  terminated or revoked. Performed at [facility], [place] [address] [place], [place] [address]      Labs: BNP (last 3 results) No results for input(s): BNP in the last 8760 hours. Basic Metabolic Panel: Recent Labs  Lab 08/08/19 0459 08/09/19 0432 08/10/19 0603 08/11/19 0606 08/12/19 0424  NA 129* 133* 133* 136 139  K 4.0 3.5 3.4* 3.3* 3.2*  CL 94* 101 99 101 105  CO2 22 21* 21* 23 20*  GLUCOSE 180*  134* 117* 156* 153*  BUN 19 18 26* 32* 21  CREATININE 1.09* 0.90 0.90 0.94 0.80  CALCIUM 9.2 8.2* 8.5* 8.2* 8.0*   Liver Function Tests: Recent Labs  Lab 08/10/19 0603 08/12/19 0424  AST 22 28  ALT 15 14  ALKPHOS 47 48  BILITOT 2.0* 1.5*  PROT 5.7* 5.6*  ALBUMIN 2.9* 2.5*   No results for input(s): LIPASE, AMYLASE in the last 168 hours. No results for input(s): AMMONIA in the last 168 hours. CBC: Recent Labs  Lab 08/08/19 0459 08/09/19 0432 08/10/19 0603  WBC 13.2* 8.1 8.4  HGB 12.5 10.9* 11.9*  HCT 37.7 32.3* 36.1  MCV 101.9* 99.4 99.7  PLT 179 PLATELET CLUMPS NOTED ON SMEAR, UNABLE TO ESTIMATE 80*   Cardiac Enzymes: No results for input(s): CKTOTAL, CKMB, CKMBINDEX, TROPONINI in the last 168 hours. BNP: Invalid input(s): POCBNP CBG: No results for input(s): GLUCAP in the last 168 hours. D-Dimer No results for input(s): DDIMER in the last 72 hours. Hgb A1c No results for input(s): HGBA1C in the last 72 hours. Lipid Profile No results for input(s): CHOL, HDL, LDLCALC, TRIG, CHOLHDL, LDLDIRECT in the last 72 hours. Thyroid function studies No results for input(s): TSH, T4TOTAL, T3FREE, THYROIDAB in the last 72  hours.  Invalid input(s): FREET3 Anemia work up No results for input(s): VITAMINB12, FOLATE, FERRITIN, TIBC, IRON, RETICCTPCT in the last 72 hours. Urinalysis    Component Value Date/Time   COLORURINE STRAW (A) 03/30/2017 0631   APPEARANCEUR CLEAR 03/30/2017 0631   LABSPEC 1.004 (L) 03/30/2017 0631   PHURINE 9.0 (H) 03/30/2017 0631   GLUCOSEU NEGATIVE 03/30/2017 0631   HGBUR NEGATIVE 03/30/2017 0631   BILIRUBINUR NEGATIVE 03/30/2017 0631   KETONESUR NEGATIVE 03/30/2017 0631   PROTEINUR NEGATIVE 03/30/2017 0631   UROBILINOGEN 1.0 03/22/2015 1200   NITRITE NEGATIVE 03/30/2017 0631   LEUKOCYTESUR NEGATIVE 03/30/2017 0631   Sepsis Labs Invalid input(s): PROCALCITONIN,  WBC,  LACTICIDVEN Microbiology Recent Results (from the past 240 hour(s))  Respiratory Panel by RT PCR (Flu A&B, Covid) - Nasopharyngeal Swab     Status: None   Collection Time: 08/08/19  9:46 AM   Specimen: Nasopharyngeal Swab  Result Value Ref Range Status   SARS Coronavirus 2 by RT PCR NEGATIVE NEGATIVE Final    Comment: (NOTE) SARS-CoV-2 target nucleic acids are NOT DETECTED. The SARS-CoV-2 RNA is generally detectable in upper respiratoy specimens during the acute phase of infection. The lowest concentration of SARS-CoV-2 viral copies this assay can detect is 131 copies/mL. A negative result does not preclude SARS-Cov-2 infection and should not be used as the sole basis for treatment or other patient management decisions. A negative result may occur with  improper specimen collection/handling, submission of specimen other than nasopharyngeal swab, presence of viral mutation(s) within the areas targeted by this assay, and inadequate number of viral copies (<131 copies/mL). A negative result must be combined with clinical observations, patient history, and epidemiological information. The expected result is Negative. Fact Sheet for Patients:  PinkCheek.be Fact Sheet for  Healthcare Providers:  GravelBags.it This test is not yet ap proved or cleared by the [place] FDA and  has been authorized for detection and/or diagnosis of SARS-CoV-2 by FDA under an Emergency Use Authorization (EUA). This EUA will remain  in effect (meaning this test can be used) for the duration of the COVID-19 declaration under Section 564(b)(1) of the Act, 21 U.S.C. section 360bbb-3(b)(1), unless the authorization is terminated or revoked sooner.    Influenza A by  PCR NEGATIVE NEGATIVE Final   Influenza B by PCR NEGATIVE NEGATIVE Final    Comment: (NOTE) The Xpert Xpress SARS-CoV-2/FLU/RSV assay is intended as an aid in  the diagnosis of influenza from Nasopharyngeal swab specimens and  should not be used as a sole basis for treatment. Nasal washings and  aspirates are unacceptable for Xpert Xpress SARS-CoV-2/FLU/RSV  testing. Fact Sheet for Patients: PinkCheek.be Fact Sheet for Healthcare Providers: GravelBags.it This test is not yet approved or cleared by the [place] FDA and  has been authorized for detection and/or diagnosis of SARS-CoV-2 by  FDA under an Emergency Use Authorization (EUA). This EUA will remain  in effect (meaning this test can be used) for the duration of the  Covid-19 declaration under Section 564(b)(1) of the Act, 21  U.S.C. section 360bbb-3(b)(1), unless the authorization is  terminated or revoked. Performed at [facility], [place] [address], [place], [place] [address]      Time coordinating discharge: Over 30 minutes  SIGNED:   Darliss Cheney, MD  [facility] 08/14/2019, 10:07 AM  If 7PM-7AM, please contact night-coverage [email or web address]

## 2019-08-14 NOTE — Plan of Care (Signed)
  Problem: Pain Managment: Goal: General experience of comfort will improve Outcome: Progressing   Problem: Skin Integrity: Goal: Risk for impaired skin integrity will decrease Outcome: Progressing   

## 2019-08-14 NOTE — Care Management Important Message (Signed)
Important Message  Patient Details  Name: Bailey [name] MRN: [id number] Date of Birth: 10-Jul-1926   Medicare Important Message Given:  Yes     Memory Argue 08/14/2019, 2:11 PM

## 2019-08-14 NOTE — Progress Notes (Addendum)
Report called to [facility]. All questions answered. [facility] to transport.   1745: [facility] here to pick up pt. Informed son via phone.

## 2019-08-14 NOTE — Progress Notes (Addendum)
1010: Wasted 34ml of Morphine in stericycle with Marissa, RN.  [id number]: Wasted 68ml of Morphine in stericycle with Chief Operating Officer.

## 2019-08-14 NOTE — Progress Notes (Signed)
1010 Witnessed a waste of Morphine 20 ml with Clifton Custard to [facility] [employer] 1740 Witnessed a waste of Morphine 37ml with Sharyn Lull RM to stericycle.

## 2019-08-14 NOTE — Progress Notes (Signed)
Palliative Medicine RN Note: Rec'd a call from pt's son Ozzie Hoyle [phone]), who had questions about her Ativan order and when/if she is going to [facility].   1. I spoke with our NP Shae, who has restarted the Ativan per Cliff's request/this phone call. I also reached out to RN Marlowe Kays to ask that she go ahead and give it.  2. Checked in with [facility] liaison Bevely Palmer. Mrs. Lui has a bed today. She will call Ozzie Hoyle and update him. When messaging Marlowe Kays, I asked her to also call Ozzie Hoyle when Mrs Gilford is leaving for [facility].  Marjie Skiff Irania Durell, RN, BSN, [facility] Palliative Medicine Team 08/14/2019 10:51 AM Office [phone]

## 2019-08-14 NOTE — Progress Notes (Signed)
[facility] ([facility]) Hospital Liaison note.   Received request from TOC manager for family interest in [facility]. Chart reviewed and eligibility confirmed. Spoke with family to confirm interest and explain services. Family agreeable to transfer today. TOC aware.   [facility] will notify TOC when registration paperwork has been completed to arrange transport.  RN please call report to [phone].   Thank you,   Mary Anne Robertson, RN, CCM  [facility] Hospital Liaison (listed on AMION under [facility]osp[facility])  [phone]  

## 2019-08-14 NOTE — Progress Notes (Signed)
Daily Progress Note   Patient Name: Bailey [name]       Date: 08/14/2019 DOB: 03-22-1927  Age: 86 y.o. MRN#: [id number] Attending Physician: Darliss Cheney, MD Primary Care Physician: Virgie Dad, MD Admit Date: 08/08/2019  Reason for Consultation/Follow-up: Establishing goals of care, Hospice Evaluation and Terminal Care  Subjective: Sleeping wakes to voice and light touch, utters a few unintelligible words and quickly falls back to sleep. Appears comfortable.    Length of Stay: 6  Current Medications: Scheduled Meds:   acetaminophen  650 mg Oral Q6H   chlorhexidine  60 mL Topical Once   ciprofloxacin-dexamethasone  4 drop Left EAR BID   lidocaine  1 patch Transdermal Daily   lidocaine  1 patch Transdermal Daily   LORazepam  0.5 mg Intravenous BID   metoprolol tartrate  2.5 mg Intravenous Q6H   pantoprazole (PROTONIX) IV  40 mg Intravenous Q24H   povidone-iodine  2 application Topical Once    Continuous Infusions:  morphine 2.5 mg/hr (08/12/19 1516)    PRN Meds: 0.9 % irrigation (POUR BTL), acetaminophen **OR** acetaminophen, antiseptic oral rinse, glycopyrrolate **OR** glycopyrrolate **OR** glycopyrrolate, haloperidol **OR** haloperidol **OR** haloperidol lactate, ipratropium, ketorolac, LORazepam **OR** LORazepam **OR** LORazepam, morphine, ondansetron **OR** ondansetron (ZOFRAN) IV, polyvinyl alcohol, zinc oxide  Physical Exam Constitutional:      General: She is not in acute distress.    Comments: Cachectic, temporal wasting, lethargic - wakes briefly to stimulation, unintelligible speech  Pulmonary:     Effort: Pulmonary effort is normal. No respiratory distress.  Skin:    General: Skin is warm and dry.  Neurological:     Mental Status: She is disoriented.               Vital Signs: BP (!) 144/90 (BP Location: Right Arm)    Pulse 94    Temp 99.2 F (37.3 C) (Axillary)    Resp (!) 22    Ht 5' 8" (1.727 m)    Wt 44.4 kg    SpO2 90%    BMI 14.88 kg/m  SpO2: SpO2: 90 % O2 Device: O2 Device: Nasal Cannula O2 Flow Rate: O2 Flow Rate (L/min): 2 L/min  Intake/output summary:   Intake/Output Summary (Last 24 hours) at 08/14/2019 1003 Last data filed at 08/14/2019 0700 Gross per 24 hour  Intake 0 ml  Output --  Net 0 ml  LBM: Last BM Date: 08/07/19 Baseline Weight: Weight: 44.4 kg Most recent weight: Weight: 44.4 kg       Palliative Assessment/Data: PPS 10%    Flowsheet Rows     Most Recent Value  Intake Tab  Referral Department  Hospitalist  Unit at Time of Referral  Cardiac/Telemetry Unit  Palliative Care Primary Diagnosis  Sepsis/Infectious Disease  Date Notified  08/10/19  Palliative Care Type  New Palliative care  Reason for referral  Clarify Goals of Care  Date of Admission  08/08/19  Date first seen by Palliative Care  08/12/19  # of days Palliative referral response time  2 Day(s)  # of days IP prior to Palliative referral  2  Clinical Assessment  Palliative Performance Scale Score  10%  Psychosocial & Spiritual Assessment  Palliative Care Outcomes  Patient/Family meeting held?  Yes  Who was at the meeting?  son  Palliative Care Outcomes  Clarified goals of care, Counseled regarding hospice, Transitioned to hospice, Provided psychosocial or spiritual support      Patient Active Problem List   Diagnosis Date Noted   Aspiration into airway    Comfort measures only status    Palliative care encounter    Closed left ankle fracture 08/08/2019   Leukocytosis 08/08/2019   DNR (do not resuscitate) 08/08/2019   Advanced care planning/counseling discussion 07/26/2019   Pneumonia 03/21/2019   Bilateral leg edema 09/29/2018   Recurrent major depression ([place]) 01/15/2018   Non-seasonal allergic rhinitis 06/19/2017    Endometrial mass 06/19/2017   Pleuritic pain 03/30/2017   Essential hypertension 03/30/2017   CKD (chronic kidney disease) stage 3, GFR 30-59 ml/min 03/30/2017   Chronic Hyponatremia 03/30/2017   History of pulmonary embolus (PE) 03/30/2017   History of pulmonary embolism 02/15/2017   Osteoporosis without current pathological fracture 02/15/2017   B12 deficiency 02/15/2017   Generalized arthritis 02/15/2017   Anemia of chronic disease 09/05/2015   Chronic pain syndrome 04/16/2015   Fracture of multiple pubic rami ([place]) 03/30/2015   GERD without esophagitis 03/24/2015   Chronic constipation 03/24/2015   Fracture, intertrochanteric, right femur (HCC)    Osteoarthrosis, forearm    Senile osteoporosis    Malignant neoplasm of female breast ([place])    Insomnia    Hypothyroidism    Pure hypercholesterolemia    Closed fracture of unspecified part of vertebral column without mention of spinal cord injury    Edema     Palliative Care Assessment & Plan   HPI: 86 y.o. female  with past medical history of severe diffuse chronic pain, BRCA, DM, PE (not on anticoagulation) who was admitted on 08/08/2019 after a fall with a fractured ankle.  It was decided the risks of surgery outweigh the benefits and she was being managed medically.  Unfortunately she aspiration and had a difficult day on Saturday.  She appeared much better on Sunday, but today is having great difficulty with respiration.  She has decreased consciousness and it is felt she has aspirated once again.   PMT called for [place] and symptom management   Assessment: Patient appears comfortable - medications reviewed. Discussed with RN.  Spoke with patient's son, Bailey [name]. He is hopeful to speak with patient regarding decision to go to hospice facility. We discussed patient's current condition and mental status. We discussed that ativan has been held and not given since 10 Am yesterday. Discussed that her mental  status is likely r/t a combination of her illness and medication. He asks about stopping her  morphine infusion - we discuss this is possible however I doubt Bailey [name] will be able to fully understand the situation and discuss her goals. We also discuss risk of discomfort and respiratory distress with stopping medications used to relieve this.   We discuss that Bailey [name] has previously made her wishes known and he currently serves as her Media planner as Bailey [name] is not able to make her own medical decisions. We discuss that the decisions he is making are in line with Bailey [name]'s previously stated wishes.  Again discussed options of SNF with hospice vs hospice facility - Mr. [name] decides he would prefer hospice facility and feels this would be in line with patient's wishes. He agrees to go ahead and move forward with that decision as he understands it is unlikely Bailey [name] will be able to make this decision for herself.   Recommendations/Plan:  Discussed hospice facility transfer with [facility], RN, and Dr. Doristine Bosworth  Continue comfort measures only  Goals of Care and Additional Recommendations:  Limitations on Scope of Treatment: Full Comfort Care  Code Status:  DNR  Prognosis:   < 2 weeks  Discharge Planning:  Hospice facility  Care plan was discussed with RN, Dr. Doristine Bosworth, [facility] Hero, and son Bailey [name]  Thank you for allowing the Palliative Medicine Team to assist in the care of this patient.   Total Time 25 minutes Prolonged Time Billed  no       Greater than 50%  of this time was spent counseling and coordinating care related to the above assessment and plan.  Juel Burrow, DNP, [facility] Palliative Medicine Team Team Phone # [phone]  Pager [phone]

## 2019-08-14 NOTE — Progress Notes (Signed)
[facility]) Hospital Liaison note.     Paperwork completed. Please arrange transport to [facility].      RN please call report to [phone].   Thank you,     Farrel Gordon, RN, CCM       [facility] (listed on [place] under [facility])     [phone]

## 2019-08-14 NOTE — TOC Transition Note (Signed)
Transition of Care [facility]) - CM/SW Discharge Note   Patient Details  Name: Bailey [name] MRN: [id number] Date of Birth: 07/17/1926  Transition of Care [facility]) CM/SW Contact:  Sharin Mons, RN Phone Number: [phone] 08/14/2019, 11:35 AM   Clinical Narrative:     Patient will DC to: [facility] Anticipated DC date: 08/14/2019 Family notified: Derald Macleod ( son) Transport by: Corey Harold   Per MD patient ready for DC to [facility]. RN, patient, patient's family, and facility notified of DC. Discharge Summary sent to facility. RN to call report prior to discharge ([phone]). DC packet on chart. Ambulance transport requested for patient.   RNCM will sign off for now as intervention is no longer needed. Please consult [place] again if new needs arise.   Final next level of care: Hospice Medical Facility([facility]) Barriers to Discharge: No Barriers Identified   Patient Goals and CMS Choice        Discharge Placement                       Discharge Plan and Services                                     Social Determinants of Health (SDOH) Interventions     Readmission Risk Interventions No flowsheet data found.

## 2019-09-14 DEATH — deceased
